# Patient Record
Sex: Female | Born: 1935 | Race: White | Hispanic: No | Smoking: Never smoker
Health system: Southern US, Community
[De-identification: ages and names within clinical notes are randomized; demographics above are authoritative.]

## PROBLEM LIST (undated history)

## (undated) DIAGNOSIS — M4800 Spinal stenosis, site unspecified: Secondary | ICD-10-CM

## (undated) DIAGNOSIS — I499 Cardiac arrhythmia, unspecified: Secondary | ICD-10-CM

## (undated) DIAGNOSIS — E669 Obesity, unspecified: Secondary | ICD-10-CM

## (undated) DIAGNOSIS — D649 Anemia, unspecified: Secondary | ICD-10-CM

## (undated) DIAGNOSIS — I2510 Atherosclerotic heart disease of native coronary artery without angina pectoris: Secondary | ICD-10-CM

## (undated) DIAGNOSIS — I10 Essential (primary) hypertension: Secondary | ICD-10-CM

## (undated) DIAGNOSIS — H8110 Benign paroxysmal vertigo, unspecified ear: Secondary | ICD-10-CM

## (undated) DIAGNOSIS — I350 Nonrheumatic aortic (valve) stenosis: Secondary | ICD-10-CM

## (undated) DIAGNOSIS — M1990 Unspecified osteoarthritis, unspecified site: Secondary | ICD-10-CM

## (undated) DIAGNOSIS — Z9989 Dependence on other enabling machines and devices: Secondary | ICD-10-CM

## (undated) DIAGNOSIS — B269 Mumps without complication: Secondary | ICD-10-CM

## (undated) DIAGNOSIS — Z950 Presence of cardiac pacemaker: Secondary | ICD-10-CM

## (undated) DIAGNOSIS — K635 Polyp of colon: Secondary | ICD-10-CM

## (undated) DIAGNOSIS — I340 Nonrheumatic mitral (valve) insufficiency: Secondary | ICD-10-CM

## (undated) DIAGNOSIS — Z86718 Personal history of other venous thrombosis and embolism: Secondary | ICD-10-CM

## (undated) DIAGNOSIS — G4733 Obstructive sleep apnea (adult) (pediatric): Secondary | ICD-10-CM

## (undated) DIAGNOSIS — E785 Hyperlipidemia, unspecified: Secondary | ICD-10-CM

## (undated) DIAGNOSIS — K859 Acute pancreatitis without necrosis or infection, unspecified: Secondary | ICD-10-CM

## (undated) DIAGNOSIS — Z86010 Personal history of colonic polyps: Secondary | ICD-10-CM

## (undated) DIAGNOSIS — B019 Varicella without complication: Secondary | ICD-10-CM

## (undated) DIAGNOSIS — IMO0002 Reserved for concepts with insufficient information to code with codable children: Secondary | ICD-10-CM

## (undated) DIAGNOSIS — I4819 Other persistent atrial fibrillation: Secondary | ICD-10-CM

## (undated) DIAGNOSIS — D509 Iron deficiency anemia, unspecified: Secondary | ICD-10-CM

## (undated) DIAGNOSIS — G4730 Sleep apnea, unspecified: Secondary | ICD-10-CM

## (undated) DIAGNOSIS — T7840XA Allergy, unspecified, initial encounter: Secondary | ICD-10-CM

## (undated) HISTORY — PX: APPENDECTOMY: SHX54

## (undated) HISTORY — DX: Hyperlipidemia, unspecified: E78.5

## (undated) HISTORY — DX: Atherosclerotic heart disease of native coronary artery without angina pectoris: I25.10

## (undated) HISTORY — PX: JOINT REPLACEMENT: SHX530

## (undated) HISTORY — PX: CYST REMOVAL HAND: SHX6279

## (undated) HISTORY — DX: Obesity, unspecified: E66.9

## (undated) HISTORY — DX: Iron deficiency anemia, unspecified: D50.9

## (undated) HISTORY — DX: Reserved for concepts with insufficient information to code with codable children: IMO0002

## (undated) HISTORY — DX: Allergy, unspecified, initial encounter: T78.40XA

## (undated) HISTORY — DX: Personal history of other venous thrombosis and embolism: Z86.718

## (undated) HISTORY — DX: Spinal stenosis, site unspecified: M48.00

## (undated) HISTORY — PX: LUMBAR LAMINECTOMY: SHX95

## (undated) HISTORY — DX: Anemia, unspecified: D64.9

## (undated) HISTORY — DX: Acute pancreatitis without necrosis or infection, unspecified: K85.90

## (undated) HISTORY — PX: YAG LASER APPLICATION: SHX6189

## (undated) HISTORY — PX: DILATION AND CURETTAGE OF UTERUS: SHX78

## (undated) HISTORY — PX: MENISCUS REPAIR: SHX5179

## (undated) HISTORY — DX: Sleep apnea, unspecified: G47.30

## (undated) HISTORY — DX: Unspecified osteoarthritis, unspecified site: M19.90

## (undated) HISTORY — DX: Essential (primary) hypertension: I10

## (undated) HISTORY — PX: EYE SURGERY: SHX253

## (undated) HISTORY — PX: PACEMAKER INSERTION: SHX728

## (undated) HISTORY — DX: Benign paroxysmal vertigo, unspecified ear: H81.10

## (undated) HISTORY — DX: Obstructive sleep apnea (adult) (pediatric): G47.33

## (undated) HISTORY — DX: Varicella without complication: B01.9

## (undated) HISTORY — PX: TUBAL LIGATION: SHX77

## (undated) HISTORY — DX: Mumps without complication: B26.9

## (undated) HISTORY — PX: SPINE SURGERY: SHX786

## (undated) HISTORY — DX: Personal history of colonic polyps: Z86.010

## (undated) HISTORY — DX: Polyp of colon: K63.5

## (undated) HISTORY — DX: Dependence on other enabling machines and devices: Z99.89

## (undated) HISTORY — PX: TONSILECTOMY, ADENOIDECTOMY, BILATERAL MYRINGOTOMY AND TUBES: SHX2538

## (undated) HISTORY — DX: Nonrheumatic aortic (valve) stenosis: I35.0

---

## 1993-08-31 HISTORY — PX: TOTAL ABDOMINAL HYSTERECTOMY: SHX209

## 2006-08-31 LAB — HM DEXA SCAN

## 2011-05-07 LAB — PROTIME-INR

## 2011-05-22 LAB — PROTIME-INR

## 2011-06-06 LAB — PROTIME-INR

## 2011-06-12 LAB — PROTIME-INR

## 2011-07-01 LAB — PROTIME-INR

## 2011-07-22 LAB — PROTIME-INR

## 2011-08-06 LAB — PROTIME-INR

## 2011-08-28 LAB — PROTIME-INR

## 2011-09-01 HISTORY — PX: JOINT REPLACEMENT: SHX530

## 2011-09-01 HISTORY — PX: REPLACEMENT TOTAL KNEE: SUR1224

## 2011-09-03 LAB — PROTIME-INR

## 2011-09-10 LAB — PROTIME-INR

## 2011-09-24 LAB — PROTIME-INR

## 2011-10-26 LAB — PROTIME-INR

## 2011-11-12 LAB — PROTIME-INR

## 2011-11-30 LAB — PROTIME-INR

## 2013-03-31 LAB — HM PAP SMEAR: HM Pap smear: NORMAL

## 2013-03-31 LAB — HM MAMMOGRAPHY: HM MAMMO: NORMAL

## 2013-10-04 ENCOUNTER — Encounter: Payer: Self-pay | Admitting: Cardiology

## 2013-10-04 ENCOUNTER — Ambulatory Visit (INDEPENDENT_AMBULATORY_CARE_PROVIDER_SITE_OTHER): Payer: Medicare Other | Admitting: Cardiology

## 2013-10-04 VITALS — BP 152/80 | HR 85 | Ht 63.5 in | Wt 257.8 lb

## 2013-10-04 DIAGNOSIS — I10 Essential (primary) hypertension: Secondary | ICD-10-CM

## 2013-10-04 DIAGNOSIS — I4891 Unspecified atrial fibrillation: Secondary | ICD-10-CM | POA: Insufficient documentation

## 2013-10-04 NOTE — Progress Notes (Signed)
Patient ID: [name], female   DOB: 12-30-1933, 80 y.o.   MRN: [id number]     Patient Name: Tracy Miles Date of Encounter: 10/04/2013  Primary Care Provider:  No primary provider on file. Primary Cardiologist:  [name], H  Problem List   Past Medical History  Diagnosis Date  . Hypertension   . Heart murmur   . Coronary artery disease   . Atrial fibrillation    Past Surgical History  Procedure Laterality Date  . Tonsilectomy, adenoidectomy, bilateral myringotomy and tubes    . Lamenectomy    . Tubal ligation    . Total abdominal hysterectomy    . Replacement total knee    . Cyst removal hand    . Meniscos repair      Allergies  Allergies  Allergen Reactions  . Other     Sulfa drugs  . Penicillins Rash    HPI  A very pleasant 80 year old female who recently moved here from [place] after her husband passed away and she wanted to be closer to her family. The patient has a lifelong history of paroxysmal atrial fibrillation. The first time because diagnosed with was at age 21. She has been having on and of episodes that are very symptomatic for her associated with rapid ventricular response and with symptoms of shortness of breath. She has a history of electrical cardioversion in 1995. She went to rapid A. fib in August all 2014, after cardioversion on 05/10/2013 the patient went back to A. fib the following day. Another cardioversion was performed on 06/22/2013 and the patient was loaded with amiodarone and maintained on 400 mg of amiodarone orally daily. Because of recurrent episodes of atrial fibrillation patient was referred to an electrophysiologist in [place]. However patient moved to [place] earlier than was expected and never made it to her appointment. She is coming here today to reestablish cardiology care. She states that she is since she has been started on amiodarone she feels profoundly fatigued. In the past she used to play golf on a  regular basis however right now she is not able to as she feels profound shortness of breath and fatigue. She denies any chest pain, orthopnea or paroxysmal nocturnal dyspnea. She states that her last echocardiogram was performed in 2013 and she believes that her ejection fraction was preserved.  Home Medications  Prior to Admission medications   Medication Sig Start Date End Date Taking? Authorizing Provider  lisinopril (PRINIVIL,ZESTRIL) 10 MG tablet  09/14/13   Historical Provider, MD  Alveda Reasons 20 MG TABS tablet  09/19/13   Historical Provider, MD    Family History  Family History  Problem Relation Age of Onset  . Heart attack Mother   . Cancer - Prostate Father   . COPD Father   . Heart disease Brother   . Diabetes Maternal Grandmother     Social History  History   Social History  . Marital Status: Widowed    Spouse Name: N/A    Number of Children: N/A  . Years of Education: N/A   Occupational History  . Not on file.   Social History Main Topics  . Smoking status: Never Smoker   . Smokeless tobacco: Not on file  . Alcohol Use: Not on file  . Drug Use: Not on file  . Sexual Activity: Not on file   Other Topics Concern  . Not on file   Social History Narrative  . No narrative on file     Review of  Systems, as per HPI, otherwise negative General:  No chills, fever, night sweats or weight changes.  Cardiovascular:  No chest pain, dyspnea on exertion, edema, orthopnea, palpitations, paroxysmal nocturnal dyspnea. Dermatological: No rash, lesions/masses Respiratory: No cough, dyspnea Urologic: No hematuria, dysuria Abdominal:   No nausea, vomiting, diarrhea, bright red blood per rectum, melena, or hematemesis Neurologic:  No visual changes, wkns, changes in mental status. All other systems reviewed and are otherwise negative except as noted above.  Physical Exam  Blood pressure 152/80, pulse 85, height 5' 3.5" (1.613 m), weight 257 lb 12.8 oz (116.937 kg).    General: Pleasant, NAD Psych: Normal affect. Neuro: Alert and oriented X 3. Moves all extremities spontaneously. HEENT: Normal  Neck: Supple without bruits or JVD. Lungs:  Resp regular and unlabored, CTA. Heart: RRR no s3, s4, 2/6 systolic murmur Abdomen: Soft, non-tender, non-distended, BS + x 4.  Extremities: No clubbing, cyanosis or edema. DP/PT/Radials 2+ and equal bilaterally.  Labs:   Accessory Clinical Findings  Echocardiogram - none  ECG -sinus rhythm with first degree AV block, low voltage EKG, nonspecific ST abnormality. Abnormal EKG   Assessment & Plan  80 year old female with lifelong history of paroxysmal atrial fibrillation on anticoagulation with Xarelto, status post cardioversion in October 2014 on amiodarone 400 mg daily, currently in sinus rhythm. The patient symptomatic with shortness of breath and fatigue. We will order a transthoracic echocardiogram since we don't have any records about her LV function, filling pressures and left atrial size. There is systolic murmur on exam, most probably mitral regurgitation. We'll also order labs including TSH to evaluate for possible side effects of amiodarone. We will refer patient shouldn't to Dr. all read for consideration of possible ablation. We will request all the records from the hospital in [place]. Her blood pressure is elevated today, she states is usually controlled, we will recheck at the next visit and readjust her blood pressure medication.  [name], MD, [facility] 10/04/2013, 3:47 PM

## 2013-10-04 NOTE — Patient Instructions (Signed)
**Note De-Identified Tracy Miles Obfuscation** Your physician has requested that you have an echocardiogram. Echocardiography is a painless test that uses sound waves to create images of your heart. It provides your doctor with information about the size and shape of your heart and how well your heart's chambers and valves are working. This procedure takes approximately one hour. There are no restrictions for this procedure.  Your physician recommends that you return for lab work in: today  Your physician recommends that you schedule an office visit with Dr Rayann Heman for possible ablation

## 2013-10-05 ENCOUNTER — Telehealth: Payer: Self-pay | Admitting: *Deleted

## 2013-10-05 ENCOUNTER — Telehealth: Payer: Self-pay | Admitting: Internal Medicine

## 2013-10-05 DIAGNOSIS — I10 Essential (primary) hypertension: Secondary | ICD-10-CM

## 2013-10-05 LAB — COMPREHENSIVE METABOLIC PANEL
ALT: 16 U/L (ref 0–35)
AST: 19 U/L (ref 0–37)
Albumin: 4 g/dL (ref 3.5–5.2)
Alkaline Phosphatase: 88 U/L (ref 39–117)
BUN: 23 mg/dL (ref 6–23)
CO2: 29 mEq/L (ref 19–32)
Calcium: 9.5 mg/dL (ref 8.4–10.5)
Chloride: 107 mEq/L (ref 96–112)
Creatinine, Ser: 1.1 mg/dL (ref 0.4–1.2)
GFR: 49.56 mL/min — ABNORMAL LOW (ref >60.00)
Glucose, Bld: 99 mg/dL (ref 70–99)
Potassium: 4.2 mEq/L (ref 3.5–5.1)
Sodium: 145 mEq/L (ref 135–145)
Total Bilirubin: 0.6 mg/dL (ref 0.3–1.2)
Total Protein: 7.1 g/dL (ref 6.0–8.3)

## 2013-10-05 LAB — CBC WITH DIFFERENTIAL/PLATELET
Basophils Absolute: 0 10*3/uL (ref 0.0–0.1)
Basophils Relative: 0.4 % (ref 0.0–3.0)
Eosinophils Absolute: 0.3 10*3/uL (ref 0.0–0.7)
Eosinophils Relative: 3.5 % (ref 0.0–5.0)
HCT: 27.1 % — ABNORMAL LOW (ref 36.0–46.0)
Hemoglobin: 8.6 g/dL — ABNORMAL LOW (ref 12.0–15.0)
Lymphocytes Relative: 14.7 % (ref 12.0–46.0)
Lymphs Abs: 1.2 10*3/uL (ref 0.7–4.0)
MCHC: 31.6 g/dL (ref 30.0–36.0)
MCV: 82 fl (ref 78.0–100.0)
Monocytes Absolute: 0.4 10*3/uL (ref 0.1–1.0)
Monocytes Relative: 5.2 % (ref 3.0–12.0)
Neutro Abs: 6.2 10*3/uL (ref 1.4–7.7)
Neutrophils Relative %: 76.2 % (ref 43.0–77.0)
Platelets: 334 10*3/uL (ref 150.0–400.0)
RBC: 3.31 Mil/uL — ABNORMAL LOW (ref 3.87–5.11)
RDW: 15.9 % — ABNORMAL HIGH (ref 11.5–14.6)
WBC: 8.2 10*3/uL (ref 4.5–10.5)

## 2013-10-05 LAB — TSH: TSH: 6.31 u[IU]/mL — ABNORMAL HIGH (ref 0.35–5.50)

## 2013-10-05 NOTE — Telephone Encounter (Signed)
Labs results from 10/04/13 reviewed by Dr. Percival Spanish. Results indicate Hgb 8.6/Hct 27.1. Dr. Percival Spanish advises for patient to have lab work repeated on Monday, 10/08/13, and for her to be sent or pick up stool guiac cards so that she can get stool guaic sample/readings.  Will also request her to get her previous medical records from [place] forwarded to our office. Spoke with patient. She is agreeable to Dr. Rosezella Florida advisements. She will return Monday for repeat labwork. She will perform the stool guiac cards and she will work to get her records transferred to our office. Note copied to Dr. Meda Coffee.

## 2013-10-05 NOTE — Telephone Encounter (Signed)
Spoke with patient after she requested further information regarding where and when to take the stool/hemoccult sample. Informed her to return hemoccult card to [address], [place], [place] - Lab.  Patient verbalized understanding. She stated that instead of driving here to pick up the hemoccult card, she obtained one from her [facility] nurse. Patient agreed to come in on Monday, 10/08/13 for repeat CBC. She is also aware to call back for any concerns or problems, including obvious bleeding or worsening symptoms.

## 2013-10-05 NOTE — Progress Notes (Signed)
Quick Note:  Labs results from 10/04/13 reviewed by Dr. Percival Spanish. Results indicate Hgb 8.6/Hct 27.1. Dr. Percival Spanish advises for patient to have lab work repeated on Monday, 10/08/13, and for her to be sent or pick up stool guiac cards so that she can get stool guaic sample/readings. Will also request her to get her previous medical records from [place] forwarded to our office. Spoke with patient. She is agreeable to Dr. Rosezella Florida advisements. She will return Monday for repeat labwork. She will perform the stool guiac cards and she will work to get her records transferred to our office. Note copied to Dr. Meda Coffee. ______

## 2013-10-05 NOTE — Telephone Encounter (Signed)
ROI faxed to [facility] [place] at [phone]

## 2013-10-05 NOTE — Telephone Encounter (Signed)
New problem    Pt stated she received a call stating she need to r/s her echocardiogram but don't know why. Pt would like to speak to nurse concerning this matter. Dr Meda Coffee is primary doctor.

## 2013-10-05 NOTE — Telephone Encounter (Signed)
I spoke with [facility] before calling pt about ECHO scheduled for 10/12/13. She had not called pt & appointment was still a go for the 12th.  I called pt & she said she had received a recorded message yesterday telling her she needed to reschedule ECHO.  Pt is aware 10/12/13 at 1pm is still ok for her ECHO as scheduled Horton Chin RN

## 2013-10-06 ENCOUNTER — Encounter: Payer: Self-pay | Admitting: Internal Medicine

## 2013-10-06 ENCOUNTER — Telehealth: Payer: Self-pay | Admitting: *Deleted

## 2013-10-06 DIAGNOSIS — D649 Anemia, unspecified: Secondary | ICD-10-CM

## 2013-10-06 NOTE — Telephone Encounter (Signed)
Received call from [facility] re: results of hemoccult tests that was ordered.  States the hemocult was positive.  Spoke w/pt who states she is not having any active bleeding; does not see any blood when she wipes. Denies any abdominal pain. States she is constipated. Has not been taking anything for constipation. Has an appointment on Mon for CBS in our office.  Spoke w/Dr. Meda Coffee who suggests that she be referred to GI physician.  Also suggested she take MOM and a stool softner (colace).  Notified pt and and advised that someone would call her with an appointment.  Will notify her of CBC results after Dr. Meda Coffee reviews. Will send request to [facility] to make referral.

## 2013-10-09 ENCOUNTER — Other Ambulatory Visit (INDEPENDENT_AMBULATORY_CARE_PROVIDER_SITE_OTHER): Payer: Medicare Other

## 2013-10-09 DIAGNOSIS — I10 Essential (primary) hypertension: Secondary | ICD-10-CM

## 2013-10-09 LAB — CBC WITH DIFFERENTIAL/PLATELET
Basophils Absolute: 0 10*3/uL (ref 0.0–0.1)
Basophils Relative: 0.5 % (ref 0.0–3.0)
Eosinophils Absolute: 0.3 10*3/uL (ref 0.0–0.7)
Eosinophils Relative: 3.8 % (ref 0.0–5.0)
HCT: 26.3 % — ABNORMAL LOW (ref 36.0–46.0)
Hemoglobin: 8.1 g/dL — ABNORMAL LOW (ref 12.0–15.0)
Lymphocytes Relative: 15.9 % (ref 12.0–46.0)
Lymphs Abs: 1.1 10*3/uL (ref 0.7–4.0)
MCHC: 31 g/dL (ref 30.0–36.0)
MCV: 81.7 fl (ref 78.0–100.0)
MONO ABS: 0.5 10*3/uL (ref 0.1–1.0)
MONOS PCT: 7.4 % (ref 3.0–12.0)
Neutro Abs: 5.2 10*3/uL (ref 1.4–7.7)
Neutrophils Relative %: 72.4 % (ref 43.0–77.0)
PLATELETS: 341 10*3/uL (ref 150.0–400.0)
RBC: 3.22 Mil/uL — ABNORMAL LOW (ref 3.87–5.11)
RDW: 15.8 % — ABNORMAL HIGH (ref 11.5–14.6)
WBC: 7.2 10*3/uL (ref 4.5–10.5)

## 2013-10-10 NOTE — Progress Notes (Signed)
Patient ID: Tracy Miles, female   DOB: 03-02-1934, 80 y.o.   MRN: [id number]  The patient has severe anemia and positive FOBT (hemocult). The risk of bleeding currently out weights the risk of stroke, we will hold Xarelto. The patient is scheduled for a GI evaluation, we will try to arrange for an earlier appointment. We will start aspirin 325 mg po daily, decrease amiodarone to 200 mg po daily as TSH is elevated and check free T4.  [name], H 10/10/2013

## 2013-10-11 ENCOUNTER — Other Ambulatory Visit: Payer: Self-pay

## 2013-10-11 DIAGNOSIS — I4891 Unspecified atrial fibrillation: Secondary | ICD-10-CM

## 2013-10-11 DIAGNOSIS — I10 Essential (primary) hypertension: Secondary | ICD-10-CM

## 2013-10-12 ENCOUNTER — Other Ambulatory Visit (INDEPENDENT_AMBULATORY_CARE_PROVIDER_SITE_OTHER): Payer: Medicare Other

## 2013-10-12 ENCOUNTER — Ambulatory Visit (HOSPITAL_COMMUNITY): Payer: Medicare Other | Attending: Cardiology | Admitting: Cardiology

## 2013-10-12 ENCOUNTER — Telehealth: Payer: Self-pay | Admitting: Internal Medicine

## 2013-10-12 DIAGNOSIS — I4891 Unspecified atrial fibrillation: Secondary | ICD-10-CM

## 2013-10-12 DIAGNOSIS — I059 Rheumatic mitral valve disease, unspecified: Secondary | ICD-10-CM | POA: Insufficient documentation

## 2013-10-12 DIAGNOSIS — I10 Essential (primary) hypertension: Secondary | ICD-10-CM

## 2013-10-12 LAB — T4, FREE: Free T4: 0.99 ng/dL (ref 0.60–1.60)

## 2013-10-12 NOTE — Telephone Encounter (Signed)
Records rec From [facility] gave to Operators

## 2013-10-12 NOTE — Progress Notes (Signed)
   Patient ID: Tracy Miles, female    DOB: 05/16/1934, 80 y.o.   MRN: [id number]  HPI    Review of Systems    Physical Exam

## 2013-10-12 NOTE — Progress Notes (Signed)
Echo performed. 

## 2013-10-17 ENCOUNTER — Ambulatory Visit: Payer: Medicare Other | Admitting: Physician Assistant

## 2013-10-19 ENCOUNTER — Other Ambulatory Visit (INDEPENDENT_AMBULATORY_CARE_PROVIDER_SITE_OTHER): Payer: Medicare Other

## 2013-10-19 ENCOUNTER — Encounter: Payer: Self-pay | Admitting: Physician Assistant

## 2013-10-19 ENCOUNTER — Ambulatory Visit (INDEPENDENT_AMBULATORY_CARE_PROVIDER_SITE_OTHER): Payer: Medicare Other | Admitting: Physician Assistant

## 2013-10-19 VITALS — BP 130/62 | HR 72 | Ht 63.5 in | Wt 257.4 lb

## 2013-10-19 DIAGNOSIS — R195 Other fecal abnormalities: Secondary | ICD-10-CM

## 2013-10-19 DIAGNOSIS — D649 Anemia, unspecified: Secondary | ICD-10-CM

## 2013-10-19 DIAGNOSIS — E782 Mixed hyperlipidemia: Secondary | ICD-10-CM | POA: Insufficient documentation

## 2013-10-19 DIAGNOSIS — E785 Hyperlipidemia, unspecified: Secondary | ICD-10-CM

## 2013-10-19 LAB — CBC WITH DIFFERENTIAL/PLATELET
Basophils Absolute: 0 10*3/uL (ref 0.0–0.1)
Basophils Relative: 0.2 % (ref 0.0–3.0)
EOS PCT: 3.7 % (ref 0.0–5.0)
Eosinophils Absolute: 0.3 10*3/uL (ref 0.0–0.7)
HEMATOCRIT: 28 % — AB (ref 36.0–46.0)
LYMPHS ABS: 1.3 10*3/uL (ref 0.7–4.0)
Lymphocytes Relative: 17.9 % (ref 12.0–46.0)
MCHC: 31.5 g/dL (ref 30.0–36.0)
MCV: 80.1 fl (ref 78.0–100.0)
MONOS PCT: 7.1 % (ref 3.0–12.0)
Monocytes Absolute: 0.5 10*3/uL (ref 0.1–1.0)
NEUTROS ABS: 5.1 10*3/uL (ref 1.4–7.7)
Neutrophils Relative %: 71.1 % (ref 43.0–77.0)
Platelets: 378 10*3/uL (ref 150.0–400.0)
RBC: 3.5 Mil/uL — ABNORMAL LOW (ref 3.87–5.11)
RDW: 15.7 % — ABNORMAL HIGH (ref 11.5–14.6)
WBC: 7.2 10*3/uL (ref 4.5–10.5)

## 2013-10-19 LAB — IBC PANEL
Iron: 15 ug/dL — ABNORMAL LOW (ref 42–145)
SATURATION RATIOS: 2.8 % — AB (ref 20.0–50.0)
Transferrin: 384.3 mg/dL — ABNORMAL HIGH (ref 212.0–360.0)

## 2013-10-19 LAB — FERRITIN: Ferritin: 15.1 ng/mL (ref 10.0–291.0)

## 2013-10-19 MED ORDER — MOVIPREP 100 G PO SOLR: 1.0000 | ORAL | Status: DC

## 2013-10-19 NOTE — Patient Instructions (Signed)
Please go to the basement level to have your labs drawn.  You have been scheduled for an endoscopy and colonoscopy with propofol. Please follow the written instructions given to you at your visit today. Please pick up your prep at the pharmacy within the next 1-3 days. If you use inhalers (even only as needed), please bring them with you on the day of your procedure. Marland Kitchen

## 2013-10-19 NOTE — Progress Notes (Addendum)
Subjective:    Patient ID: Tracy Miles, female    DOB: [birth year], 80 y.o.   MRN: [id number]  HPI  Tracy Miles is a pleasant 80 year old white female new to GI today referred by cardiology. Recently established  with Dr. [name]. Patient states she just  relocated to [place] from [place] within the past few months. She has history of atrial fibrillation remote history of DVT x2, hypertension and has been chronically anticoagulated for many years. Most recently she has been on Xarelto  over the past couple of years. Labs were checked on February 4 and she was found to be anemic with hemoglobin of 8.6 hematocrit of 27.1 and MCV of 82 platelets 334 TSH slightly elevated at 6.31 chemistries unremarkable. Repeat labs on 10/09/2013 11 8.1 hematocrit of 26.3. Patient tells me that she did a Hemoccult at [place] where she resides and was told this was positive. Patient has no prior history of anemia that she is aware of. She says she has been fatigued over the past 4 or 5 months but thought that was due to the stress of moving etc. She has noticed some exertional dyspnea but no chest pain. She has no GI complaints other than constipation which started when she was placed on amiodarone. She is just had her dose of amiodarone decreased but hasn't noticed any change in his bowel habits. She is having a bowel movement every day, to every other day but says her stools are hard her. She has no complaints of abdominal pain, melena or hematochezia. No dysphagia ,odynophagia, heartburn or indigestion. Xarelto was discontinued last week with a finding of the anemia and she is now on a regular aspirin daily. Patient does relate history of colon polyps and had been followed by a physician in [place]. She had been having colonoscopies about every 3 years but was told at the last colon that she could go 10 years. Her last colonoscopy was in 2005. She is no family history of colon cancer polyps that she  is aware of.        Review of Systems  Constitutional: Positive for fatigue.  HENT: Negative.   Eyes: Negative.   Respiratory: Positive for shortness of breath.   Cardiovascular: Negative.   Gastrointestinal: Positive for constipation.  Endocrine: Negative.   Genitourinary: Negative.   Musculoskeletal: Negative.   Skin: Negative.   Allergic/Immunologic: Negative.   Neurological: Negative.   Hematological: Negative.   Psychiatric/Behavioral: Negative.    Outpatient Prescriptions Prior to Visit  Medication Sig Dispense Refill  . amiodarone (PACERONE) 200 MG tablet Take 200 mg by mouth daily.      Marland Kitchen atorvastatin (LIPITOR) 20 MG tablet Take 20 mg by mouth daily.      . calcium carbonate (OS-CAL) 600 MG TABS tablet Take 600 mg by mouth 2 (two) times daily with a meal.      . lisinopril (PRINIVIL,ZESTRIL) 10 MG tablet       . loratadine (CLARITIN) 10 MG tablet Take 10 mg by mouth daily.      . potassium chloride (K-DUR) 10 MEQ tablet Take 10 mEq by mouth daily.      . verapamil (CALAN) 80 MG tablet Take 80 mg by mouth 3 (three) times daily.      Alveda Reasons 20 MG TABS tablet        No facility-administered medications prior to visit.   Allergies  Allergen Reactions  . Sulfa Antibiotics   . Penicillins Rash   Patient Active Problem  List   Diagnosis Date Noted  . Other and unspecified hyperlipidemia 10/19/2013  . Atrial fibrillation 10/04/2013  . Essential hypertension 10/04/2013   History  Substance Use Topics  . Smoking status: Never Smoker   . Smokeless tobacco: Never Used  . Alcohol Use: No   family history includes COPD in her father; Diabetes in her maternal grandmother; Heart attack in her mother; Heart disease in her brother; Prostate cancer in her father. There is no history of Colon cancer, Pancreatic cancer, Stomach cancer, Throat cancer, Kidney disease, or Liver disease.     Objective:   Physical Exam  older white female in no acute distress, pleasant blood  pressure 130/62 pulse 72 height 5 foot 3 weight 257 BMI 44.8, HEENT; nontraumatic normocephalic EOMI PERRLA sclera anicteric, Supple; no JVD, Cardiovascula;r regular rate and rhythm with S1-S2 no murmur or gallop, Pulmonary; clear bilaterally, Abdomen; large soft basically nontender there is no palpable mass or hepatosplenomegaly bowel sounds are active, Rectal; exam the external lesion noted scant stool in the rectal vault brown and trace heme positive, Ext; no clubbing cyanosis or edema skin warm and dry, Psych ;mood and affect normal and appropriate        Assessment & Plan:  #78  80 year old female with finding of new normocytic anemia in the setting of chronic anticoagulation with Xarelto . Xarelto  has been discontinued and she is now on aspirin 325 daily- stool trace heme positive. Patient symptomatic with fatigue and exertional dyspnea. Will need to rule out occult colon lesion, gastric lesion/AVMs etc. #2 personal history of colon polyps-type not known records pending #3 atrial fibrillation #4 hyperlipidemia #5 hypertension  Plan; Will obtain records from her gastroenterologist in [facility] Schedule for colonoscopy and upper endoscopy with Dr. Larita Fife is discussed in detail with patient and she is agreeable to proceed She will remain on aspirin 325 daily Repeat CBC with differential today and also check iron studies We discussed possibility of blood transfusion should her hemoglobin continue to drift., and she is agreeable Further workup pending findings of above.  Addendum: Reviewed and agree with initial management. Jerene Bears, MD

## 2013-10-25 ENCOUNTER — Encounter: Payer: Self-pay | Admitting: Internal Medicine

## 2013-10-26 ENCOUNTER — Encounter: Payer: Medicare Other | Admitting: Internal Medicine

## 2013-11-01 ENCOUNTER — Ambulatory Visit: Payer: Medicare Other | Admitting: Internal Medicine

## 2013-11-06 ENCOUNTER — Ambulatory Visit (INDEPENDENT_AMBULATORY_CARE_PROVIDER_SITE_OTHER): Payer: Medicare Other | Admitting: Internal Medicine

## 2013-11-06 ENCOUNTER — Encounter: Payer: Self-pay | Admitting: Internal Medicine

## 2013-11-06 VITALS — BP 186/84 | HR 76 | Ht 63.5 in | Wt 258.0 lb

## 2013-11-06 DIAGNOSIS — R5383 Other fatigue: Secondary | ICD-10-CM

## 2013-11-06 DIAGNOSIS — R0602 Shortness of breath: Secondary | ICD-10-CM

## 2013-11-06 DIAGNOSIS — I10 Essential (primary) hypertension: Secondary | ICD-10-CM

## 2013-11-06 DIAGNOSIS — I4891 Unspecified atrial fibrillation: Secondary | ICD-10-CM

## 2013-11-06 DIAGNOSIS — R5381 Other malaise: Secondary | ICD-10-CM

## 2013-11-06 DIAGNOSIS — D649 Anemia, unspecified: Secondary | ICD-10-CM

## 2013-11-06 LAB — BASIC METABOLIC PANEL
BUN: 21 mg/dL (ref 6–23)
CO2: 30 mEq/L (ref 19–32)
Calcium: 9.7 mg/dL (ref 8.4–10.5)
Chloride: 103 mEq/L (ref 96–112)
Creatinine, Ser: 1.1 mg/dL (ref 0.4–1.2)
GFR: 52.77 mL/min — AB (ref >60.00)
GLUCOSE: 101 mg/dL — AB (ref 70–99)
POTASSIUM: 4.2 meq/L (ref 3.5–5.1)
Sodium: 141 mEq/L (ref 135–145)

## 2013-11-06 LAB — CBC WITH DIFFERENTIAL/PLATELET
Basophils Absolute: 0 10*3/uL (ref 0.0–0.1)
Basophils Relative: 0.3 % (ref 0.0–3.0)
EOS PCT: 4.2 % (ref 0.0–5.0)
Eosinophils Absolute: 0.3 10*3/uL (ref 0.0–0.7)
HEMATOCRIT: 26.8 % — AB (ref 36.0–46.0)
Hemoglobin: 8.3 g/dL — ABNORMAL LOW (ref 12.0–15.0)
LYMPHS ABS: 1.2 10*3/uL (ref 0.7–4.0)
Lymphocytes Relative: 16 % (ref 12.0–46.0)
MCHC: 31 g/dL (ref 30.0–36.0)
MONO ABS: 0.4 10*3/uL (ref 0.1–1.0)
Monocytes Relative: 6.1 % (ref 3.0–12.0)
NEUTROS PCT: 73.4 % (ref 43.0–77.0)
Neutro Abs: 5.3 10*3/uL (ref 1.4–7.7)
Platelets: 336 10*3/uL (ref 150.0–400.0)
RBC: 3.47 Mil/uL — AB (ref 3.87–5.11)
RDW: 16.6 % — ABNORMAL HIGH (ref 11.5–14.6)
WBC: 7.2 10*3/uL (ref 4.5–10.5)

## 2013-11-06 MED ORDER — APIXABAN 5 MG PO TABS: 5.0000 mg | ORAL_TABLET | Freq: Two times a day (BID) | ORAL | Status: DC

## 2013-11-06 MED ORDER — AMIODARONE HCL 200 MG PO TABS: 200.0000 mg | ORAL_TABLET | Freq: Every day | ORAL | Status: DC

## 2013-11-06 NOTE — Patient Instructions (Signed)
Your physician recommends that you schedule a follow-up appointment in: 6 weeks with Dr Rayann Heman  Your physician has recommended that you have a pulmonary function test. Pulmonary Function Tests are a group of tests that measure how well air moves in and out of your lungs.  Your physician has requested that you have en exercise stress myoview. For further information please visit [email or web address]. Please follow instruction sheet, as given.   Your physician has recommended you make the following change in your medication:  1) Stop Aspirin 2) Start Eliquis 5mg  twice daily  Your physician recommends that you return for lab work today: BMP/CBC

## 2013-11-08 NOTE — Progress Notes (Addendum)
Primary Care Physician: [name], MD Referring Physician:  Dr Higinio Plan Tracy Miles is a 80 y.o. female with a h/o atrial fibrillation who presents for EP consultation.  She has a long history of tachypalpitations.  She has been diagnosed with atrial fibrillation.  She reports abrupt onset/offset of tachypalpitations since age 35.  These have waxed and waned in frequency over time.  She was cardioverted in 1995.  Recently she reports increasing frequency and duration of episodes of atrial fibrillation.  She was cardioverted 05/10/13 and again 06/22/13.  Sh ehas been placed on amiodarone.   Because of recurrent episodes of atrial fibrillation she was referred to an electrophysiologist in [place] when living there.  She has since moved to [place]. In the past she used to play golf on a regular basis however recently she has been unable to do so because of shortness of breath and fatigue.  She was evaluated by Dr Meda Coffee and is referred to EP for further evaluation.  Today, she denies symptoms of palpitations, chest pain,  orthopnea, PND, lower extremity edema, dizziness, presyncope, syncope, or neurologic sequela. The patient is tolerating medications without difficulties and is otherwise without complaint today.   Past Medical History  Diagnosis Date   Hypertension    Heart murmur    Coronary artery disease    Atrial fibrillation    Anemia    Arthritis    Colon polyp    Personal history of DVT (deep vein thrombosis)     x 2   Hyperlipidemia    Obesity    Pancreatitis     post hysterectomy   Sleep apnea    Aortic stenosis     Very mild   Hypercholesteremia    OSA on CPAP    PAF (paroxysmal atrial fibrillation)    History of deep vein thrombophlebitis of lower extremity    History of valvular heart disease     LEFT   Past Surgical History  Procedure Laterality Date   Tonsilectomy, adenoidectomy, bilateral myringotomy and tubes     Laminectomy      Tubal ligation     Total abdominal hysterectomy     Replacement total knee Left    Cyst removal hand Bilateral    Meniscus repair Bilateral     Current Outpatient Prescriptions  Medication Sig Dispense Refill   amiodarone (PACERONE) 200 MG tablet Take 1 tablet (200 mg total) by mouth daily.       atorvastatin (LIPITOR) 20 MG tablet Take 20 mg by mouth daily.       bumetanide (BUMEX) 1 MG tablet Take 1 tablet by mouth daily.       calcium carbonate (OS-CAL) 600 MG TABS tablet Take 600 mg by mouth 2 (two) times daily with a meal.       KLOR-CON M10 10 MEQ tablet Take 1 tablet by mouth daily.       lisinopril (PRINIVIL,ZESTRIL) 10 MG tablet Take 10 mg by mouth daily.        loratadine (CLARITIN) 10 MG tablet Take 10 mg by mouth daily as needed.        verapamil (COVERA HS) 180 MG (CO) 24 hr tablet Take 180 mg by mouth at bedtime.       apixaban (ELIQUIS) 5 MG TABS tablet Take 1 tablet (5 mg total) by mouth 2 (two) times daily.  60 tablet  0   No current facility-administered medications for this visit.    Allergies  Allergen Reactions  Sulfa Antibiotics    Zebeta [Bisoprolol Fumarate]    Penicillins Rash    History   Social History   Marital Status: Widowed    Spouse Name: N/A    Number of Children: N/A   Years of Education: N/A   Occupational History   Not on file.   Social History Main Topics   Smoking status: Never Smoker    Smokeless tobacco: Never Used   Alcohol Use: No   Drug Use: No   Sexual Activity: Not on file   Other Topics Concern   Not on file   Social History Narrative   No narrative on file    Family History  Problem Relation Age of Onset   Heart attack Mother    Prostate cancer Father    COPD Father    Heart disease Brother    Diabetes Maternal Grandmother    Colon cancer Neg Hx    Pancreatic cancer Neg Hx    Stomach cancer Neg Hx    Throat cancer Neg Hx    Kidney disease Neg Hx    Liver disease Neg  Hx    CAD     Hypertension      ROS- All systems are reviewed and negative except as per the HPI above  Physical Exam: Filed Vitals:   11/06/13 1122  BP: 186/84  Pulse: 76  Height: 5' 3.5" (1.613 m)  Weight: 258 lb (117.028 kg)    GEN- The patient is well appearing, alert and oriented x 3 today.   Head- normocephalic, atraumatic Eyes-  Sclera clear, conjunctiva pink Ears- hearing intact Oropharynx- clear Neck- supple, no JVP Lymph- no cervical lymphadenopathy Lungs- Clear to ausculation bilaterally, normal work of breathing Heart- Regular rate and rhythm, no murmurs, rubs or gallops, PMI not laterally displaced GI- soft, NT, ND, + BS Extremities- no clubbing, cyanosis, or edema MS- no significant deformity or atrophy Skin- no rash or lesion Psych- euthymic mood, full affect Neuro- strength and sensation are intact  EKG today reveals sinus rhythm 76 bpm, PR 210, otherwise normal ekg Dr Thera Flake notes are reviewed today 26 pages of [facility] from [facility] cardiology are also reviewed  Assessment and Plan:   1. SOB/ fatigue Unclear etiology Given significant decline in exercise tolerance, I think that myoview is warranted.  I will therefore arrange for myoview at the next available time. Will obtain PFTs as she is on amiodarone Her symptoms could also be due to anemia.  She will need close follow-up with primary care for this.  2. afib Her chads2vasc score is at least 5.  She also has a h/o DVT.  I would therefore advise anticoagulation.  Given AVEROES data, I would recommend eliquis over asa which she is currently taking. I will start eliquis and she will need close follow-up of anemia with primary care.  We will obtain a bmet and cbc today.  Given her anemia, she would be a poor candidate for ablation presently.  3. htn Stable No change required today  4. Anemia As above  Return in 6 weeks

## 2013-11-13 DIAGNOSIS — D509 Iron deficiency anemia, unspecified: Secondary | ICD-10-CM | POA: Insufficient documentation

## 2013-11-13 DIAGNOSIS — R5383 Other fatigue: Secondary | ICD-10-CM | POA: Insufficient documentation

## 2013-11-13 DIAGNOSIS — R0602 Shortness of breath: Secondary | ICD-10-CM | POA: Insufficient documentation

## 2013-11-13 HISTORY — DX: Iron deficiency anemia, unspecified: D50.9

## 2013-11-15 ENCOUNTER — Ambulatory Visit (INDEPENDENT_AMBULATORY_CARE_PROVIDER_SITE_OTHER): Payer: Medicare Other | Admitting: Internal Medicine

## 2013-11-15 DIAGNOSIS — R0602 Shortness of breath: Secondary | ICD-10-CM

## 2013-11-15 DIAGNOSIS — I4891 Unspecified atrial fibrillation: Secondary | ICD-10-CM

## 2013-11-15 LAB — PULMONARY FUNCTION TEST
DL/VA % pred: 78 %
DL/VA: 3.67 ml/min/mmHg/L
DLCO unc % pred: 62 %
DLCO unc: 14.34 ml/min/mmHg
FEF 25-75 POST: 2.34 L/s
FEF 25-75 Pre: 1.62 L/sec
FEF2575-%CHANGE-POST: 44 %
FEF2575-%PRED-POST: 159 %
FEF2575-%Pred-Pre: 110 %
FEV1-%Change-Post: 9 %
FEV1-%PRED-PRE: 92 %
FEV1-%Pred-Post: 100 %
FEV1-POST: 1.95 L
FEV1-Pre: 1.78 L
FEV1FVC-%Change-Post: 4 %
FEV1FVC-%Pred-Pre: 107 %
FEV6-%CHANGE-POST: 3 %
FEV6-%Pred-Post: 94 %
FEV6-%Pred-Pre: 91 %
FEV6-Post: 2.32 L
FEV6-Pre: 2.24 L
FEV6FVC-%Pred-Post: 105 %
FEV6FVC-%Pred-Pre: 105 %
FVC-%Change-Post: 4 %
FVC-%PRED-POST: 90 %
FVC-%Pred-Pre: 86 %
FVC-Post: 2.34 L
FVC-Pre: 2.24 L
POST FEV1/FVC RATIO: 83 %
PRE FEV1/FVC RATIO: 79 %
Post FEV6/FVC ratio: 100 %
Pre FEV6/FVC Ratio: 100 %
RV % PRED: 87 %
RV: 2.01 L
TLC % pred: 89 %
TLC: 4.38 L

## 2013-11-15 NOTE — Progress Notes (Signed)
PFT done today. Jennifer Castillo, CMA  

## 2013-11-16 ENCOUNTER — Other Ambulatory Visit: Payer: Self-pay | Admitting: Family Medicine

## 2013-11-16 ENCOUNTER — Encounter: Payer: Self-pay | Admitting: Family Medicine

## 2013-11-16 ENCOUNTER — Ambulatory Visit (INDEPENDENT_AMBULATORY_CARE_PROVIDER_SITE_OTHER): Payer: Medicare Other | Admitting: Family Medicine

## 2013-11-16 VITALS — BP 132/68 | HR 67 | Temp 98.0°F | Ht 63.0 in | Wt 255.1 lb

## 2013-11-16 DIAGNOSIS — E785 Hyperlipidemia, unspecified: Secondary | ICD-10-CM

## 2013-11-16 DIAGNOSIS — G4730 Sleep apnea, unspecified: Secondary | ICD-10-CM

## 2013-11-16 DIAGNOSIS — I4891 Unspecified atrial fibrillation: Secondary | ICD-10-CM

## 2013-11-16 DIAGNOSIS — R195 Other fecal abnormalities: Secondary | ICD-10-CM

## 2013-11-16 DIAGNOSIS — I25810 Atherosclerosis of coronary artery bypass graft(s) without angina pectoris: Secondary | ICD-10-CM

## 2013-11-16 DIAGNOSIS — T7840XA Allergy, unspecified, initial encounter: Secondary | ICD-10-CM

## 2013-11-16 DIAGNOSIS — D649 Anemia, unspecified: Secondary | ICD-10-CM

## 2013-11-16 DIAGNOSIS — M5090 Cervical disc disorder, unspecified, unspecified cervical region: Secondary | ICD-10-CM | POA: Insufficient documentation

## 2013-11-16 DIAGNOSIS — R0602 Shortness of breath: Secondary | ICD-10-CM

## 2013-11-16 DIAGNOSIS — Z86718 Personal history of other venous thrombosis and embolism: Secondary | ICD-10-CM

## 2013-11-16 DIAGNOSIS — IMO0002 Reserved for concepts with insufficient information to code with codable children: Secondary | ICD-10-CM

## 2013-11-16 DIAGNOSIS — K5900 Constipation, unspecified: Secondary | ICD-10-CM

## 2013-11-16 DIAGNOSIS — M1990 Unspecified osteoarthritis, unspecified site: Secondary | ICD-10-CM

## 2013-11-16 DIAGNOSIS — I10 Essential (primary) hypertension: Secondary | ICD-10-CM

## 2013-11-16 DIAGNOSIS — E669 Obesity, unspecified: Secondary | ICD-10-CM

## 2013-11-16 HISTORY — DX: Reserved for concepts with insufficient information to code with codable children: IMO0002

## 2013-11-16 LAB — CBC
HCT: 26.5 % — ABNORMAL LOW (ref 36.0–46.0)
Hemoglobin: 8.2 g/dL — ABNORMAL LOW (ref 12.0–15.0)
MCH: 23.2 pg — ABNORMAL LOW (ref 26.0–34.0)
MCHC: 30.9 g/dL (ref 30.0–36.0)
MCV: 74.9 fL — ABNORMAL LOW (ref 78.0–100.0)
Platelets: 366 10*3/uL (ref 150–400)
RBC: 3.54 MIL/uL — ABNORMAL LOW (ref 3.87–5.11)
RDW: 15.9 % — AB (ref 11.5–15.5)
WBC: 6.3 10*3/uL (ref 4.0–10.5)

## 2013-11-16 LAB — HEPATIC FUNCTION PANEL
ALBUMIN: 4.1 g/dL (ref 3.5–5.2)
ALT: 21 U/L (ref 0–35)
AST: 21 U/L (ref 0–37)
Alkaline Phosphatase: 82 U/L (ref 39–117)
Bilirubin, Direct: 0.1 mg/dL (ref 0.0–0.3)
Indirect Bilirubin: 0.5 mg/dL (ref 0.2–1.2)
TOTAL PROTEIN: 6.5 g/dL (ref 6.0–8.3)
Total Bilirubin: 0.6 mg/dL (ref 0.2–1.2)

## 2013-11-16 LAB — RENAL FUNCTION PANEL
Albumin: 4.1 g/dL (ref 3.5–5.2)
BUN: 18 mg/dL (ref 6–23)
CHLORIDE: 104 meq/L (ref 96–112)
CO2: 29 meq/L (ref 19–32)
CREATININE: 1.02 mg/dL (ref 0.50–1.10)
Calcium: 9.3 mg/dL (ref 8.4–10.5)
GLUCOSE: 105 mg/dL — AB (ref 70–99)
POTASSIUM: 4.7 meq/L (ref 3.5–5.3)
Phosphorus: 4 mg/dL (ref 2.3–4.6)
Sodium: 141 mEq/L (ref 135–145)

## 2013-11-16 LAB — LIPID PANEL
CHOLESTEROL: 142 mg/dL (ref 0–200)
HDL: 51 mg/dL (ref >39)
LDL Cholesterol: 73 mg/dL (ref 0–99)
TRIGLYCERIDES: 90 mg/dL (ref <150)
Total CHOL/HDL Ratio: 2.8 Ratio
VLDL: 18 mg/dL (ref 0–40)

## 2013-11-16 NOTE — Progress Notes (Signed)
Pre visit review using our clinic review tool, if applicable. No additional management support is needed unless otherwise documented below in the visit note. 

## 2013-11-16 NOTE — Patient Instructions (Signed)
Probiotic daily such as Digestive Advantage or ALign or a generic .Encouraged increased hydration and fiber in diet. Daily probiotics. If bowels not moving can use MOM 2 tbls po in 4 oz of warm prune juice by mouth every 2-3 days. If no results then repeat in 4 hours with  Dulcolax suppository pr, may repeat again in 4 more hours as needed. Seek care if symptoms worsen. Consider daily Miralax and/or Dulcolax if symptoms persist.   Constipation, Adult Constipation is when a person has fewer than 3 bowel movements a week; has difficulty having a bowel movement; or has stools that are dry, hard, or larger than normal. As people grow older, constipation is more common. If you try to fix constipation with medicines that make you have a bowel movement (laxatives), the problem may get worse. Long-term laxative use may cause the muscles of the colon to become weak. A low-fiber diet, not taking in enough fluids, and taking certain medicines may make constipation worse. CAUSES   Certain medicines, such as antidepressants, pain medicine, iron supplements, antacids, and water pills.   Certain diseases, such as diabetes, irritable bowel syndrome (IBS), thyroid disease, or depression.   Not drinking enough water.   Not eating enough fiber-rich foods.   Stress or travel.  Lack of physical activity or exercise.  Not going to the restroom when there is the urge to have a bowel movement.  Ignoring the urge to have a bowel movement.  Using laxatives too much. SYMPTOMS   Having fewer than 3 bowel movements a week.   Straining to have a bowel movement.   Having hard, dry, or larger than normal stools.   Feeling full or bloated.   Pain in the lower abdomen.  Not feeling relief after having a bowel movement. DIAGNOSIS  Your caregiver will take a medical history and perform a physical exam. Further testing may be done for severe constipation. Some tests may include:   A barium enema X-ray to  examine your rectum, colon, and sometimes, your small intestine.  A sigmoidoscopy to examine your lower colon.  A colonoscopy to examine your entire colon. TREATMENT  Treatment will depend on the severity of your constipation and what is causing it. Some dietary treatments include drinking more fluids and eating more fiber-rich foods. Lifestyle treatments may include regular exercise. If these diet and lifestyle recommendations do not help, your caregiver may recommend taking over-the-counter laxative medicines to help you have bowel movements. Prescription medicines may be prescribed if over-the-counter medicines do not work.  HOME CARE INSTRUCTIONS   Increase dietary fiber in your diet, such as fruits, vegetables, whole grains, and beans. Limit high-fat and processed sugars in your diet, such as [place] fries, hamburgers, cookies, candies, and soda.   A fiber supplement may be added to your diet if you cannot get enough fiber from foods.   Drink enough fluids to keep your urine clear or pale yellow.   Exercise regularly or as directed by your caregiver.   Go to the restroom when you have the urge to go. Do not hold it.  Only take medicines as directed by your caregiver. Do not take other medicines for constipation without talking to your caregiver first. [place] IF:   You have bright red blood in your stool.   Your constipation lasts for more than 4 days or gets worse.   You have abdominal or rectal pain.   You have thin, pencil-like stools.  You have unexplained weight loss.  MAKE SURE YOU:   Understand these instructions.  Will watch your condition.  Will get help right away if you are not doing well or get worse. Document Released: 05/15/2004 Document Revised: 11/09/2011 Document Reviewed: 05/29/2013 [facility] Patient Information 2014 [place], [place].

## 2013-11-17 ENCOUNTER — Telehealth: Payer: Self-pay | Admitting: Internal Medicine

## 2013-11-17 ENCOUNTER — Telehealth: Payer: Self-pay | Admitting: Family Medicine

## 2013-11-17 LAB — IRON: IRON: 26 ug/dL — AB (ref 42–145)

## 2013-11-17 LAB — INTRINSIC FACTOR ANTIBODIES: INTRINSIC FACTOR: NEGATIVE

## 2013-11-17 LAB — FERRITIN: Ferritin: 15 ng/mL (ref 10–291)

## 2013-11-17 MED ORDER — FERROUS FUMARATE 325 (106 FE) MG PO TABS: 1.0000 | ORAL_TABLET | Freq: Every day | ORAL | Status: DC

## 2013-11-17 NOTE — Telephone Encounter (Signed)
Patient was started on Eliquis on 11/06/13 for A-fib and history of DVT. Left message for patient to call back   Dr. Rayann Heman patient is scheduled for an Upper Endoscopy and colonoscopy to evaluate her heme positive stools and anemia.  How long will she need to hold her eliquis that was started on 11/06/13?

## 2013-11-17 NOTE — Telephone Encounter (Signed)
Relevant patient education assigned to patient using Emmi. ° °

## 2013-11-17 NOTE — Telephone Encounter (Signed)
I have spoken with Dr. Jackalyn Lombard nurse Janan Halter.  Usually held for 24 hours prior to the procedure and 48 hours after.  Dr. Rayann Heman is not in the office and will return on Monday.  Claiborne Billings asked that I contact the patient and instruct her to hold eliquis until she hears from me or Dr. Jackalyn Lombard office.   I have left a message for the patient to call back

## 2013-11-18 ENCOUNTER — Other Ambulatory Visit: Payer: Self-pay | Admitting: Family Medicine

## 2013-11-18 DIAGNOSIS — D649 Anemia, unspecified: Secondary | ICD-10-CM

## 2013-11-19 ENCOUNTER — Encounter: Payer: Self-pay | Admitting: Family Medicine

## 2013-11-19 DIAGNOSIS — M1990 Unspecified osteoarthritis, unspecified site: Secondary | ICD-10-CM

## 2013-11-19 DIAGNOSIS — G4730 Sleep apnea, unspecified: Secondary | ICD-10-CM | POA: Insufficient documentation

## 2013-11-19 DIAGNOSIS — T7840XA Allergy, unspecified, initial encounter: Secondary | ICD-10-CM | POA: Insufficient documentation

## 2013-11-19 DIAGNOSIS — Z86718 Personal history of other venous thrombosis and embolism: Secondary | ICD-10-CM | POA: Insufficient documentation

## 2013-11-19 HISTORY — DX: Unspecified osteoarthritis, unspecified site: M19.90

## 2013-11-19 NOTE — Progress Notes (Signed)
Patient ID: [name], female   DOB: [birth year], 80 y.o.   MRN: [id number] Izora Benn [id number] Sep 28, 1933 11/19/2013      Progress Note-Follow Up  Subjective  Chief Complaint  Chief Complaint  Patient presents with  . Establish Care    new patient    HPI  Patient is a 80 year old female in today for routine medical care. In today for new patient appt. she is fairly new to the area and is in need of a primary care Dr. Unfortunately she's recently been struggling with severe anemia and rectal bleeding. She's been struggling with more difficulty with atrial fibrillation as well. She notes increased fatigue increased shortness of breath. She felt her shortness of breath worsened on amiodarone. They have recently decreased her tablets from 2-1 but she's not sure if that's helping at she moved into [place] in December of 2014. She was a complicated past medical history and was widowed 3 years. No acute complaints in the office had pulmonary function testing yesterday but does not know the results yet. Denies CP/palp/SOB/HA/congestion/fevers/GI or GU c/o. Taking meds as prescribed  Past Medical History  Diagnosis Date  . Hypertension   . Heart murmur   . Coronary artery disease   . Atrial fibrillation   . Anemia   . Arthritis   . Colon polyp   . Personal history of DVT (deep vein thrombosis)     x 2  . Hyperlipidemia   . Obesity   . Pancreatitis     post hysterectomy  . Sleep apnea   . Aortic stenosis     Very mild  . Hypercholesteremia   . OSA on CPAP   . PAF (paroxysmal atrial fibrillation)   . History of deep vein thrombophlebitis of lower extremity   . History of valvular heart disease     LEFT  . Chicken pox as a child  . Measles as a child  . Mumps child and teenager  . Allergy   . Obesity   . Unspecified constipation 11/16/2013  . Heme positive stool 11/16/2013  . DDD (degenerative disc disease) 11/16/2013  . History of DVT (deep vein thrombosis)  11/19/2013    Past Surgical History  Procedure Laterality Date  . Tonsilectomy, adenoidectomy, bilateral myringotomy and tubes  child  . Laminectomy  42 yrs ago  . Tubal ligation  80 years old  . Total abdominal hysterectomy  1995    had 2 tumors- benign  . Replacement total knee Left 2013  . Cyst removal hand Bilateral 20 yrs ago  . Meniscus repair Bilateral 2000 and 2010  . Appendectomy      Family History  Problem Relation Age of Onset  . Heart attack Mother 61  . Hyperlipidemia Mother     ?  Marland Kitchen Dementia Mother   . Pernicious anemia Mother   . Heart disease Mother 5    MI  . COPD Father     smoker  . Cancer Father 29    prostate  . Heart disease Brother     quadruple bipass surgery  . Hyperlipidemia Brother   . Hypertension Brother   . Diabetes Maternal Grandmother     type 2  . Pernicious anemia Maternal Grandmother   . Colon cancer Neg Hx   . Pancreatic cancer Neg Hx   . Stomach cancer Neg Hx   . Throat cancer Neg Hx   . Liver disease Neg Hx   . Gout Son   . Atrial  fibrillation Son   . Hyperlipidemia Son   . Cancer Maternal Grandfather     liver  . Cancer Paternal Grandmother     lung- doesn't think she smokes?  . Stroke Paternal Grandfather   . Cancer Son 50    non hodgin's lymphoma  . Gout Son   . Hyperlipidemia Son   . Sleep apnea Son     History   Social History  . Marital Status: Widowed    Spouse Name: N/A    Number of Children: N/A  . Years of Education: N/A   Occupational History  . Not on file.   Social History Main Topics  . Smoking status: Never Smoker   . Smokeless tobacco: Never Used  . Alcohol Use: No  . Drug Use: No  . Sexual Activity: No     Comment: lives at [place], low sodium diet   Other Topics Concern  . Not on file   Social History Narrative  . No narrative on file    Current Outpatient Prescriptions on File Prior to Visit  Medication Sig Dispense Refill  . amiodarone (PACERONE) 200 MG tablet Take 1  tablet (200 mg total) by mouth daily.      Marland Kitchen apixaban (ELIQUIS) 5 MG TABS tablet Take 1 tablet (5 mg total) by mouth 2 (two) times daily.  60 tablet  0  . atorvastatin (LIPITOR) 20 MG tablet Take 20 mg by mouth daily.      . bumetanide (BUMEX) 1 MG tablet Take 1 tablet by mouth daily.      . calcium carbonate (OS-CAL) 600 MG TABS tablet Take 600 mg by mouth 2 (two) times daily with a meal.      . KLOR-CON M10 10 MEQ tablet Take 1 tablet by mouth daily.      Marland Kitchen lisinopril (PRINIVIL,ZESTRIL) 10 MG tablet Take 10 mg by mouth daily.       Marland Kitchen loratadine (CLARITIN) 10 MG tablet Take 10 mg by mouth daily as needed.       . verapamil (COVERA HS) 180 MG (CO) 24 hr tablet Take 180 mg by mouth at bedtime.       No current facility-administered medications on file prior to visit.    Allergies  Allergen Reactions  . Sulfa Antibiotics   . Zebeta [Bisoprolol Fumarate]   . Penicillins Rash    Review of Systems  Review of Systems  Constitutional: Negative for fever and malaise/fatigue.  HENT: Negative for congestion.   Eyes: Negative for discharge.  Respiratory: Negative for shortness of breath.   Cardiovascular: Negative for chest pain, palpitations and leg swelling.  Gastrointestinal: Negative for nausea, abdominal pain and diarrhea.  Genitourinary: Negative for dysuria.  Musculoskeletal: Negative for falls.  Skin: Negative for rash.  Neurological: Negative for loss of consciousness and headaches.  Endo/Heme/Allergies: Negative for polydipsia.  Psychiatric/Behavioral: Negative for depression and suicidal ideas. The patient is not nervous/anxious and does not have insomnia.     Objective  BP 132/68  Pulse 67  Temp(Src) 98 F (36.7 C) (Oral)  Ht 5\' 3"  (1.6 m)  Wt 255 lb 1.3 oz (115.704 kg)  BMI 45.20 kg/m2  SpO2 95%  Physical Exam  Physical Exam  Constitutional: She is oriented to person, place, and time and well-developed, well-nourished, and in no distress. No distress.  HENT:   Head: Normocephalic and atraumatic.  Eyes: Conjunctivae are normal.  Neck: Neck supple. No thyromegaly present.  Cardiovascular: Normal rate, regular rhythm and normal heart sounds.  No murmur heard. Pulmonary/Chest: Effort normal and breath sounds normal. She has no wheezes.  Abdominal: She exhibits no distension and no mass.  Musculoskeletal: She exhibits no edema.  Lymphadenopathy:    She has no cervical adenopathy.  Neurological: She is alert and oriented to person, place, and time.  Skin: Skin is warm and dry. No rash noted. She is not diaphoretic.  Psychiatric: Memory, affect and judgment normal.    Lab Results  Component Value Date   TSH 6.31* 10/04/2013   Lab Results  Component Value Date   WBC 6.3 11/16/2013   HGB 8.2* 11/16/2013   HCT 26.5* 11/16/2013   MCV 74.9* 11/16/2013   PLT 366 11/16/2013   Lab Results  Component Value Date   CREATININE 1.02 11/16/2013   BUN 18 11/16/2013   NA 141 11/16/2013   K 4.7 11/16/2013   CL 104 11/16/2013   CO2 29 11/16/2013   Lab Results  Component Value Date   ALT 21 11/16/2013   AST 21 11/16/2013   ALKPHOS 82 11/16/2013   BILITOT 0.6 11/16/2013   Lab Results  Component Value Date   CHOL 142 11/16/2013   Lab Results  Component Value Date   HDL 51 11/16/2013   Lab Results  Component Value Date   LDLCALC 73 11/16/2013   Lab Results  Component Value Date   TRIG 90 11/16/2013   Lab Results  Component Value Date   CHOLHDL 2.8 11/16/2013     Assessment & Plan  Atrial fibrillation Rate controlled and appears to be tolerating Eliquis. Has appt with cardiology  Anemia Iron level improving slowly with supplementation. Has appt with Gastroenterology soon. Will refer to hematology for further consideration  Obesity Encouraged DASH diet, decrease po intake and increase exercise as tolerated. Needs 7-8 hours of sleep nightly. Avoid trans fats, eat small, frequent meals every 4-5 hours with lean proteins, complex carbs and healthy  fats. Minimize simple carbs, GMO foods.  Essential hypertension Well controlled, no changes to meds. Encouraged heart healthy diet such as the DASH diet and exercise as tolerated.   Unspecified constipation Encouraged increased hydration and fiber in diet. Daily probiotics. If bowels not moving can use MOM 2 tbls po in 4 oz of warm prune juice by mouth every 2-3 days. If no results then repeat in 4 hours with  Dulcolax suppository pr, may repeat again in 4 more hours as needed. Seek care if symptoms worsen. Consider daily Miralax and/or Dulcolax if symptoms persist.   Shortness of breath Worsened she believes since starting Amiodarone. They have recently cut down on her dosing. Had PFTs done yesterday.   Unspecified sleep apnea On Bipap

## 2013-11-19 NOTE — Assessment & Plan Note (Signed)
Worsened she believes since starting Amiodarone. They have recently cut down on her dosing. Had PFTs done yesterday.

## 2013-11-19 NOTE — Assessment & Plan Note (Signed)
On Bipap.

## 2013-11-19 NOTE — Assessment & Plan Note (Signed)
Rate controlled and appears to be tolerating Eliquis. Has appt with cardiology

## 2013-11-19 NOTE — Assessment & Plan Note (Signed)
Encouraged DASH diet, decrease po intake and increase exercise as tolerated. Needs 7-8 hours of sleep nightly. Avoid trans fats, eat small, frequent meals every 4-5 hours with lean proteins, complex carbs and healthy fats. Minimize simple carbs, GMO foods. 

## 2013-11-19 NOTE — Assessment & Plan Note (Signed)
Iron level improving slowly with supplementation. Has appt with Gastroenterology soon. Will refer to hematology for further consideration

## 2013-11-19 NOTE — Assessment & Plan Note (Signed)
Encouraged increased hydration and fiber in diet. Daily probiotics. If bowels not moving can use MOM 2 tbls po in 4 oz of warm prune juice by mouth every 2-3 days. If no results then repeat in 4 hours with  Dulcolax suppository pr, may repeat again in 4 more hours as needed. Seek care if symptoms worsen. Consider daily Miralax and/or Dulcolax if symptoms persist.  

## 2013-11-19 NOTE — Assessment & Plan Note (Signed)
Well controlled, no changes to meds. Encouraged heart healthy diet such as the DASH diet and exercise as tolerated.  °

## 2013-11-20 MED ORDER — FERROUS FUMARATE 325 (106 FE) MG PO TABS: 1.0000 | ORAL_TABLET | Freq: Every day | ORAL | Status: DC

## 2013-11-20 NOTE — Telephone Encounter (Signed)
Discussed with Dr Laverna Peace to hold Eliquis 24 hours prior to procedure ans resume immediately afterwards if able.

## 2013-11-20 NOTE — Telephone Encounter (Signed)
Patient notified to hold eliquis today and that she will be instructed by Dr. Hilarie Fredrickson when she can resume .  She verbalized understanding

## 2013-11-20 NOTE — Addendum Note (Signed)
Addended by: Varney Daily on: 11/20/2013 10:43 AM   Modules accepted: Orders

## 2013-11-21 ENCOUNTER — Encounter: Payer: Self-pay | Admitting: Internal Medicine

## 2013-11-21 ENCOUNTER — Ambulatory Visit (AMBULATORY_SURGERY_CENTER): Payer: Medicare Other | Admitting: Internal Medicine

## 2013-11-21 VITALS — BP 138/61 | HR 71 | Temp 98.7°F | Resp 30 | Ht 63.0 in | Wt 255.0 lb

## 2013-11-21 DIAGNOSIS — D509 Iron deficiency anemia, unspecified: Secondary | ICD-10-CM

## 2013-11-21 DIAGNOSIS — D126 Benign neoplasm of colon, unspecified: Secondary | ICD-10-CM

## 2013-11-21 DIAGNOSIS — D649 Anemia, unspecified: Secondary | ICD-10-CM

## 2013-11-21 DIAGNOSIS — Z86010 Personal history of colonic polyps: Secondary | ICD-10-CM

## 2013-11-21 DIAGNOSIS — R195 Other fecal abnormalities: Secondary | ICD-10-CM

## 2013-11-21 MED ORDER — SODIUM CHLORIDE 0.9 % IV SOLN: 500.0000 mL | INTRAVENOUS | Status: DC

## 2013-11-21 NOTE — Op Note (Signed)
[place]  [address]. [place], [id number]   ENDOSCOPY PROCEDURE REPORT  PATIENT: Tracy, Miles  MR#: [id number] BIRTHDATE: 1934/01/04 , 79  yrs. old GENDER: Female ENDOSCOPIST: Jerene Bears, MD REFERRED BY:  [name], M.D. PROCEDURE DATE:  11/21/2013 PROCEDURE:  EGD, diagnostic ASA CLASS:     Class III INDICATIONS:  Iron deficiency anemia.   Heme positive stool. MEDICATIONS: MAC sedation, administered by CRNA and propofol (Diprivan) 150mg  IV TOPICAL ANESTHETIC: none  DESCRIPTION OF PROCEDURE: After the risks benefits and alternatives of the procedure were thoroughly explained, informed consent was obtained.  The LB [id number] endoscope was introduced through the mouth and advanced to the second portion of the duodenum. Without limitations.  The instrument was slowly withdrawn as the mucosa was fully examined.      The upper, middle and distal third of the esophagus were carefully inspected and no abnormalities were noted.  The z-line was well seen at the GEJ.  The endoscope was pushed into the fundus which was normal including a retroflexed view.  The antrum, gastric body, first and second part of the duodenum were unremarkable. Retroflexed views revealed no abnormalities.     The scope was then withdrawn from the patient and the procedure completed.  COMPLICATIONS: There were no complications.  ENDOSCOPIC IMPRESSION: Normal EGD  RECOMMENDATIONS: Proceed with a Colonoscopy.  eSigned:  Jerene Bears, MD 11/21/2013 3:44 PM   CC:The Patient, [name], MD, and Trellis Paganini, Amy PA-C

## 2013-11-21 NOTE — Progress Notes (Signed)
Called to room to assist during endoscopic procedure.  Patient ID and intended procedure confirmed with present staff. Received instructions for my participation in the procedure from the performing physician.  

## 2013-11-21 NOTE — Patient Instructions (Signed)
YOU HAD AN ENDOSCOPIC PROCEDURE TODAY AT THE [facility]: Refer to the procedure report that was given to you for any specific questions about what was found during the examination.  If the procedure report does not answer your questions, please call your gastroenterologist to clarify.  If you requested that your care partner not be given the details of your procedure findings, then the procedure report has been included in a sealed envelope for you to review at your convenience later.  YOU SHOULD EXPECT: Some feelings of bloating in the abdomen. Passage of more gas than usual.  Walking can help get rid of the air that was put into your GI tract during the procedure and reduce the bloating. If you had a lower endoscopy (such as a colonoscopy or flexible sigmoidoscopy) you may notice spotting of blood in your stool or on the toilet paper. If you underwent a bowel prep for your procedure, then you may not have a normal bowel movement for a few days.  DIET: Your first meal following the procedure should be a light meal and then it is ok to progress to your normal diet.  A half-sandwich or bowl of soup is an example of a good first meal.  Heavy or fried foods are harder to digest and may make you feel nauseous or bloated.  Likewise meals heavy in dairy and vegetables can cause extra gas to form and this can also increase the bloating.  Drink plenty of fluids but you should avoid alcoholic beverages for 24 hours.  ACTIVITY: Your care partner should take you home directly after the procedure.  You should plan to take it easy, moving slowly for the rest of the day.  You can resume normal activity the day after the procedure however you should NOT DRIVE or use heavy machinery for 24 hours (because of the sedation medicines used during the test).    SYMPTOMS TO REPORT IMMEDIATELY: A gastroenterologist can be reached at any hour.  During normal business hours, 8:30 AM to 5:00 PM Monday through Friday,  call [phone].  After hours and on weekends, please call the GI answering service at [phone] who will take a message and have the physician on call contact you.   Following lower endoscopy (colonoscopy or flexible sigmoidoscopy):  Excessive amounts of blood in the stool  Significant tenderness or worsening of abdominal pains  Swelling of the abdomen that is new, acute  Fever of 100F or higher  Following upper endoscopy (EGD)  Vomiting of blood or coffee ground material  New chest pain or pain under the shoulder blades  Painful or persistently difficult swallowing  New shortness of breath  Fever of 100F or higher  Black, tarry-looking stools  FOLLOW UP: If any biopsies were taken you will be contacted by phone or by letter within the next 1-3 weeks.  Call your gastroenterologist if you have not heard about the biopsies in 3 weeks.  Our staff will call the home number listed on your records the next business day following your procedure to check on you and address any questions or concerns that you may have at that time regarding the information given to you following your procedure. This is a courtesy call and so if there is no answer at the home number and we have not heard from you through the emergency physician on call, we will assume that you have returned to your regular daily activities without incident.  SIGNATURES/CONFIDENTIALITY: You and/or your care   partner have signed paperwork which will be entered into your electronic medical record.  These signatures attest to the fact that that the information above on your After Visit Summary has been reviewed and is understood.  Full responsibility of the confidentiality of this discharge information lies with you and/or your care-partner.  Recommendations Await pathology results Repeat colonoscopy in 5 years Capsule Endoscopy Iron replacement and monitor hemoglobin Resume Eliquis tomorrow

## 2013-11-21 NOTE — Op Note (Signed)
[place]  [address]. [place], [id number]   COLONOSCOPY PROCEDURE REPORT  PATIENT: Tracy, Miles  MR#: [id number] BIRTHDATE: [birth year] , 79  yrs. old GENDER: Female ENDOSCOPIST: Jerene Bears, MD REFERRED MW:NUUVO Charlett Blake, M.D. PROCEDURE DATE:  11/21/2013 PROCEDURE:   Colonoscopy with snare polypectomy First Screening Colonoscopy - Avg.  risk and is 50 yrs.  old or older - No.  Prior Negative Screening - Now for repeat screening. N/A  History of Adenoma - Now for follow-up colonoscopy & has been > or = to 3 yrs.  Yes hx of adenoma.  Has been 3 or more years since last colonoscopy.  Polyps Removed Today? Yes. ASA CLASS:   Class III INDICATIONS:Iron Deficiency Anemia, heme-positive stool, and Patient's personal history of colon polyps. MEDICATIONS: MAC sedation, administered by CRNA and Propofol (Diprivan) 170 mg IV  DESCRIPTION OF PROCEDURE:   After the risks benefits and alternatives of the procedure were thoroughly explained, informed consent was obtained.  A digital rectal exam revealed small external hemorrhoids.   The LB [id number]  endoscope was introduced through the anus and advanced to the cecum, which was identified by both the appendix and ileocecal valve. No adverse events experienced.   The quality of the prep was good, using MoviPrep  The instrument was then slowly withdrawn as the colon was fully examined.   COLON FINDINGS: A sessile polyp measuring 5 mm in size was found at the hepatic flexure.  A polypectomy was performed with a cold snare.  The resection was complete and the polyp tissue was completely retrieved.   The colon was otherwise normal.  There was no diverticulosis, inflammation, or cancers seen.  Retroflexed views revealed small external hemorrhoids. The time to cecum=6 minutes 20 seconds.  Withdrawal time=10 minutes 59 seconds.  The scope was withdrawn and the procedure completed. COMPLICATIONS: There were no  complications.  ENDOSCOPIC IMPRESSION: 1.   Sessile polyp measuring 5 mm in size was found at the hepatic flexure; polypectomy was performed with a cold snare 2.   The colon was otherwise normal  RECOMMENDATIONS: 1.  Await pathology results 2.  Repeat Colonoscopy in 5 years. 3.  You will receive a letter within 1-2 weeks with the results of your biopsy as well as final recommendations.  Please call my office if you have not received a letter after 3 weeks. 4.  Capsule endoscopy for further work-up of iron deficiency anemia and heme + stools 5.  Iron replacement and monitoring of Hgb and iron stores to ensure improvement   eSigned:  Jerene Bears, MD 11/21/2013 3:48 PM      cc: The Patient, [name], MD, and Trellis Paganini, Amy PA-C

## 2013-11-21 NOTE — Progress Notes (Signed)
Report to pacu rn, vss, bbs=clear 

## 2013-11-22 ENCOUNTER — Other Ambulatory Visit: Payer: Self-pay

## 2013-11-22 ENCOUNTER — Telehealth: Payer: Self-pay | Admitting: *Deleted

## 2013-11-22 DIAGNOSIS — R195 Other fecal abnormalities: Secondary | ICD-10-CM

## 2013-11-22 DIAGNOSIS — D509 Iron deficiency anemia, unspecified: Secondary | ICD-10-CM

## 2013-11-22 NOTE — Telephone Encounter (Signed)
Message left for the patient

## 2013-11-24 ENCOUNTER — Ambulatory Visit: Payer: Medicare Other | Admitting: Family Medicine

## 2013-11-27 ENCOUNTER — Encounter: Payer: Self-pay | Admitting: Internal Medicine

## 2013-11-27 ENCOUNTER — Ambulatory Visit (HOSPITAL_COMMUNITY): Payer: Medicare Other | Attending: Cardiology | Admitting: Radiology

## 2013-11-27 ENCOUNTER — Telehealth: Payer: Self-pay | Admitting: Hematology & Oncology

## 2013-11-27 VITALS — BP 129/64 | Ht 63.0 in | Wt 255.0 lb

## 2013-11-27 DIAGNOSIS — I4891 Unspecified atrial fibrillation: Secondary | ICD-10-CM | POA: Insufficient documentation

## 2013-11-27 DIAGNOSIS — R5383 Other fatigue: Principal | ICD-10-CM

## 2013-11-27 DIAGNOSIS — R5381 Other malaise: Secondary | ICD-10-CM | POA: Insufficient documentation

## 2013-11-27 DIAGNOSIS — R0602 Shortness of breath: Secondary | ICD-10-CM

## 2013-11-27 MED ORDER — TECHNETIUM TC 99M SESTAMIBI GENERIC - CARDIOLITE
33.0000 | Freq: Once | INTRAVENOUS | Status: AC | PRN
  Administered 2013-11-27: 33 via INTRAVENOUS

## 2013-11-27 MED ORDER — REGADENOSON 0.4 MG/5ML IV SOLN
0.4000 mg | Freq: Once | INTRAVENOUS | Status: AC
  Administered 2013-11-27: 0.4 mg via INTRAVENOUS

## 2013-11-27 NOTE — Progress Notes (Signed)
[facility] [address] [place], [place] [address] [phone]    Cardiology Nuclear Med Study  Tracy Miles is a 80 y.o. female     MRN : [id number]     DOB: [birth year]  Procedure Date: 11/27/2013  Nuclear Med Background Indication for Stress Test:  Evaluation for Ischemia History:  2014 AFIB: multiple Cardioversions, 2015 ECHO: EF: 60-65% mild AS Cardiac Risk Factors: Family History - CAD, Hypertension and Lipids  Symptoms:  Fatigue and SOB   Nuclear Pre-Procedure Caffeine/Decaff Intake:  None NPO After: 7:00pm   Lungs:  clear O2 Sat: 97% on room air. IV 0.9% NS with Angio Cath:  22g  IV Site: L Antecubital  IV Started by:  Perrin Maltese, EMT-P  Chest Size (in):  44 Cup Size: C  Height: 5\' 3"  (1.6 m)  Weight:  255 lb (115.667 kg)  BMI:  Body mass index is 45.18 kg/(m^2). Tech Comments:  No Rx this am    Nuclear Med Study 1 or 2 day study: 2 day  Stress Test Type:  Treadmill/Lexiscan  Reading MD: n/a  Order Authorizing Provider:  J.Allred MD  Resting Radionuclide: Technetium 50m Sestamibi  Resting Radionuclide Dose: 33.0 mCi on 11/28/13   Stress Radionuclide:  Technetium 85m Sestamibi  Stress Radionuclide Dose: 33.0 mCi on 11/27/13           Stress Protocol Rest HR: 62 Stress HR: 114  Rest BP: 129/64 Stress BP: 143/51  Exercise Time (min): 5:31 METS: 4.70   Predicted Max HR: 143 bpm % Max HR: 79.72 bpm Rate Pressure Product: 16302   Dose of Adenosine (mg):  n/a Dose of Lexiscan: 0.4 mg  Dose of Atropine (mg): n/a Dose of Dobutamine: n/a mcg/kg/min (at max HR)  Stress Test Technologist: Perrin Maltese, EMT-P  Nuclear Technologist:  Charlton Amor, CNMT     Rest Procedure:  Myocardial perfusion imaging was performed at rest 45 minutes following the intravenous administration of Technetium 64m Sestamibi. Rest ECG: NSR - Normal EKG  Stress Procedure:  The patient received IV Lexiscan 0.4 mg over 15-seconds with concurrent  low level exercise and then Technetium 23m Sestamibi was injected at 30-seconds while the patient continued walking one more minute. This patient was sob with the Lexiscan injection and the treadmill. Quantitative spect images were obtained after a 45-minute delay. Stress ECG: 49mm horizontal ST depression inferiorly and laterally  QPS Raw Data Images:  There is a breast shadow that accounts for the anterior attenuation. Stress Images:  very small anteroseptal breast artifact Rest Images:  very small anteroseptal breast artifact Subtraction (SDS):  No evidence of ischemia. Transient Ischemic Dilatation (Normal <1.22):  0.92 Lung/Heart Ratio (Normal <0.45):  0.32  Quantitative Gated Spect Images QGS EDV:  102 ml QGS ESV:  25 ml  Impression Exercise Capacity:  Poor exercise capacity. BP Response:  Normal blood pressure response. Clinical Symptoms:  There is dyspnea. ECG Impression:  No significant ST segment change suggestive of ischemia. Comparison with Prior Nuclear Study: No previous nuclear study performed  Overall Impression:  Low risk stress nuclear study with small area of breast attenuation artifact. No ischemia.  LV Ejection Fraction: 75%.  LV Wall Motion:  NL LV Function; NL Wall Motion  Pixie Casino, MD, [facility] Board Certified in Nuclear Cardiology Attending Cardiologist [place]

## 2013-11-27 NOTE — Telephone Encounter (Signed)
Spoke w NEW PATIENT today to remind them of their appointment with Dr. Ennever. Also, advised them to bring all meds and insurance information. ° °

## 2013-11-28 ENCOUNTER — Ambulatory Visit (HOSPITAL_BASED_OUTPATIENT_CLINIC_OR_DEPARTMENT_OTHER): Payer: Medicare Other | Admitting: Hematology & Oncology

## 2013-11-28 ENCOUNTER — Other Ambulatory Visit (HOSPITAL_BASED_OUTPATIENT_CLINIC_OR_DEPARTMENT_OTHER): Payer: Medicare Other | Admitting: Lab

## 2013-11-28 ENCOUNTER — Ambulatory Visit (HOSPITAL_COMMUNITY): Payer: Medicare Other | Attending: Cardiology

## 2013-11-28 ENCOUNTER — Ambulatory Visit (HOSPITAL_BASED_OUTPATIENT_CLINIC_OR_DEPARTMENT_OTHER): Payer: Medicare Other

## 2013-11-28 ENCOUNTER — Ambulatory Visit: Payer: Medicare Other

## 2013-11-28 ENCOUNTER — Encounter: Payer: Self-pay | Admitting: *Deleted

## 2013-11-28 VITALS — BP 154/73 | HR 72 | Temp 97.4°F | Resp 20 | Ht 63.0 in | Wt 257.0 lb

## 2013-11-28 DIAGNOSIS — D509 Iron deficiency anemia, unspecified: Secondary | ICD-10-CM

## 2013-11-28 DIAGNOSIS — D649 Anemia, unspecified: Secondary | ICD-10-CM

## 2013-11-28 DIAGNOSIS — R0989 Other specified symptoms and signs involving the circulatory and respiratory systems: Secondary | ICD-10-CM

## 2013-11-28 LAB — CBC WITH DIFFERENTIAL (CANCER CENTER ONLY)
BASO#: 0 10*3/uL (ref 0.0–0.2)
BASO%: 0.3 % (ref 0.0–2.0)
EOS ABS: 0.2 10*3/uL (ref 0.0–0.5)
EOS%: 2.8 % (ref 0.0–7.0)
HCT: 29 % — ABNORMAL LOW (ref 34.8–46.6)
HEMOGLOBIN: 8.6 g/dL — AB (ref 11.6–15.9)
LYMPH#: 0.9 10*3/uL (ref 0.9–3.3)
LYMPH%: 11.7 % — ABNORMAL LOW (ref 14.0–48.0)
MCH: 24.5 pg — ABNORMAL LOW (ref 26.0–34.0)
MCHC: 29.7 g/dL — ABNORMAL LOW (ref 32.0–36.0)
MCV: 83 fL (ref 81–101)
MONO#: 0.5 10*3/uL (ref 0.1–0.9)
MONO%: 6.3 % (ref 0.0–13.0)
NEUT%: 78.9 % (ref 39.6–80.0)
NEUTROS ABS: 5.9 10*3/uL (ref 1.5–6.5)
Platelets: 279 10*3/uL (ref 145–400)
RBC: 3.51 10*6/uL — AB (ref 3.70–5.32)
RDW: 20.1 % — ABNORMAL HIGH (ref 11.1–15.7)
WBC: 7.5 10*3/uL (ref 3.9–10.0)

## 2013-11-28 LAB — IRON AND TIBC CHCC
%SAT: 7 % — AB (ref 21–57)
IRON: 34 ug/dL — AB (ref 41–142)
TIBC: 463 ug/dL — ABNORMAL HIGH (ref 236–444)
UIBC: 428 ug/dL — ABNORMAL HIGH (ref 120–384)

## 2013-11-28 LAB — CHCC SATELLITE - SMEAR

## 2013-11-28 LAB — FERRITIN CHCC: Ferritin: 32 ng/ml (ref 9–269)

## 2013-11-28 MED ORDER — METHYLPREDNISOLONE SODIUM SUCC 125 MG IJ SOLR
80.0000 mg | Freq: Once | INTRAMUSCULAR | Status: AC
  Administered 2013-11-28: 80 mg via INTRAVENOUS

## 2013-11-28 MED ORDER — TECHNETIUM TC 99M SESTAMIBI GENERIC - CARDIOLITE
33.0000 | Freq: Once | INTRAVENOUS | Status: AC | PRN
  Administered 2013-11-28: 33 via INTRAVENOUS

## 2013-11-28 MED ORDER — METHYLPREDNISOLONE SODIUM SUCC 125 MG IJ SOLR
INTRAMUSCULAR | Status: AC
  Filled 2013-11-28: qty 2

## 2013-11-28 MED ORDER — FAMOTIDINE IN NACL 20-0.9 MG/50ML-% IV SOLN
INTRAVENOUS | Status: AC
  Filled 2013-11-28: qty 50

## 2013-11-28 MED ORDER — SODIUM CHLORIDE 0.9 % IV SOLN
1020.0000 mg | Freq: Once | INTRAVENOUS | Status: AC
  Administered 2013-11-28: 1020 mg via INTRAVENOUS
  Filled 2013-11-28: qty 34

## 2013-11-28 MED ORDER — SODIUM CHLORIDE 0.9 % IV SOLN
Freq: Once | INTRAVENOUS | Status: AC
  Administered 2013-11-28: 10:00:00 via INTRAVENOUS

## 2013-11-28 MED ORDER — FAMOTIDINE IN NACL 20-0.9 MG/50ML-% IV SOLN
20.0000 mg | Freq: Two times a day (BID) | INTRAVENOUS | Status: DC
  Administered 2013-11-28: 20 mg via INTRAVENOUS

## 2013-11-28 NOTE — Patient Instructions (Signed)

## 2013-11-28 NOTE — Progress Notes (Signed)
Referral MD  Reason for Referral: Iron deficiency anemia   No chief complaint on file. : Iron is low. I feel tired.  HPI: Ms. Tracy Miles is a very charming 80 year old white female. She moved here from [facility]. She's been down here for a couple years.  She's been having problems with anemia. She had some rectal bleeding recently. She underwent colonoscopy and upper endoscopy. Both of these were essentially negative. I think there is a polyp in the colon that was biopsied. She is to undergo a capsule endoscopy in the next week or so.  She feels very tired. She does not have a lot of energy.  She's been seen by Dr. Randel Pigg . She had iron studies done couple weeks ago. Her ferritin was only 15. She had an intrinsic factor assay done. This was normal.  She had lab work done back 2 weeks ago. Her hemoglobin is 8.2 hematocrit is 26.5. MCV was 75. White cell count and platelets were normal.  She does have some hypothyroidism. Her TSH was slightly elevated back in February. This might be watched for now. I do not see that she is on any Synthroid.  She has been put on amiodarone. She feels that this is really making her feel tired. I told her that this is a 12 medicine to take. It can cause a lot of side effects.  She is on oral iron. I just don't think she will absorb iron all that well. She has history of atrial fibrillation. 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 She's on Eliquis. Again, she's not noted any obvious bleeding.  She is currently referred to was for an evaluation.  She does not chew ice. She does not have any mouth sores. She's had no obvious skin issues. There's been no weight loss or weight gain. She is not a vegetarian.   Past Medical History  Diagnosis Date  . Hypertension   . Heart murmur   . Coronary artery disease   . Atrial fibrillation   . Anemia   . Arthritis   . Colon polyp   . Personal history of DVT (deep vein thrombosis)     x 2  . Hyperlipidemia    . Obesity   . Pancreatitis     post hysterectomy  . Sleep apnea   . Aortic stenosis     Very mild  . Hypercholesteremia   . OSA on CPAP   . PAF (paroxysmal atrial fibrillation)   . History of deep vein thrombophlebitis of lower extremity   . History of valvular heart disease     LEFT  . Chicken pox as a child  . Measles as a child  . Mumps child and teenager  . Allergy   . Obesity   . Unspecified constipation 11/16/2013  . Heme positive stool 11/16/2013  . DDD (degenerative disc disease) 11/16/2013  . History of DVT (deep vein thrombosis) 11/19/2013  . Unspecified sleep apnea 11/19/2013  . Allergic state 11/19/2013  . OA (osteoarthritis) 11/19/2013    S/p L TKR  :  Past Surgical History  Procedure Laterality Date  . Tonsilectomy, adenoidectomy, bilateral myringotomy and tubes  child  . Laminectomy  40 yrs ago  . Tubal ligation  80 years old  . Total abdominal hysterectomy  1995    had 2 tumors- benign  . Replacement total knee Left 2013  . Cyst removal hand Bilateral 20 yrs ago  . Meniscus repair Bilateral 2000 and 2010  . Appendectomy    :  Current outpatient prescriptions:amiodarone (PACERONE) 200 MG tablet, Take 1 tablet (200 mg total) by mouth daily., Disp: , Rfl: ;  apixaban (ELIQUIS) 5 MG TABS tablet, Take 1 tablet (5 mg total) by mouth 2 (two) times daily., Disp: 60 tablet, Rfl: 0;  atorvastatin (LIPITOR) 20 MG tablet, Take 20 mg by mouth daily., Disp: , Rfl: ;  bumetanide (BUMEX) 1 MG tablet, Take 1 tablet by mouth daily., Disp: , Rfl:  calcium carbonate (OS-CAL) 600 MG TABS tablet, Take 600 mg by mouth 2 (two) times daily with a meal., Disp: , Rfl: ;  ferrous fumarate (HEMOCYTE - 106 MG FE) 325 (106 FE) MG TABS tablet, Take 1 tablet (106 mg of iron total) by mouth daily., Disp: 30 each, Rfl: 2;  KLOR-CON M10 10 MEQ tablet, Take 1 tablet by mouth daily., Disp: , Rfl: ;  lisinopril (PRINIVIL,ZESTRIL) 10 MG tablet, Take 10 mg by mouth daily. , Disp: , Rfl:  loratadine  (CLARITIN) 10 MG tablet, Take 10 mg by mouth daily as needed. , Disp: , Rfl: ;  psyllium (REGULOID) 0.52 G capsule, Take 0.52 g by mouth 2 (two) times daily., Disp: , Rfl: ;  verapamil (COVERA HS) 180 MG (CO) 24 hr tablet, Take 180 mg by mouth at bedtime., Disp: , Rfl:  Current facility-administered medications:famotidine (PEPCID) IVPB 20 mg, 20 mg, Intravenous, Q12H, Volanda Napoleon, MD, 20 mg at 11/28/13 0957:  . famotidine  20 mg Intravenous Q12H  :  Allergies  Allergen Reactions  . Sulfa Antibiotics   . Zebeta [Bisoprolol Fumarate]   . Penicillins Rash  :  Family History  Problem Relation Age of Onset  . Heart attack Mother 53  . Hyperlipidemia Mother     ?  Marland Kitchen Dementia Mother   . Pernicious anemia Mother   . Heart disease Mother 31    MI  . COPD Father     smoker  . Cancer Father 13    prostate  . Heart disease Brother     quadruple bipass surgery  . Hyperlipidemia Brother   . Hypertension Brother   . Diabetes Maternal Grandmother     type 2  . Pernicious anemia Maternal Grandmother   . Colon cancer Neg Hx   . Pancreatic cancer Neg Hx   . Stomach cancer Neg Hx   . Throat cancer Neg Hx   . Liver disease Neg Hx   . Gout Son   . Atrial fibrillation Son   . Hyperlipidemia Son   . Cancer Maternal Grandfather     liver  . Cancer Paternal Grandmother     lung- doesn't think she smokes?  . Stroke Paternal Grandfather   . Cancer Son 50    non hodgin's lymphoma  . Gout Son   . Hyperlipidemia Son   . Sleep apnea Son   :  History   Social History  . Marital Status: Widowed    Spouse Name: N/A    Number of Children: N/A  . Years of Education: N/A   Occupational History  . Not on file.   Social History Main Topics  . Smoking status: Never Smoker   . Smokeless tobacco: Never Used  . Alcohol Use: No  . Drug Use: No  . Sexual Activity: No     Comment: lives at [place], low sodium diet   Other Topics Concern  . Not on file   Social History Narrative   . No narrative on file  :  Pertinent items are noted  in HPI.  Exam: @IPVITALS @  somewhat obese white female in no obvious distress. Vital signs are temperature of 97 5. Pulse 83. Blood pressure 175/91. Weight is 172 pounds. Head and neck exam shows no ocular or oral lesions. Conjunctiva are pale. There is no tongue inflammation. There is no adenopathy on the neck. Thyroid is not obviously palpable. Lungs are clear bilaterally. Cardiac exam regular rate and rhythm. She has no murmurs rubs or bruits. Abdomen soft, slightly obese. She has good bowel sounds. There is no fluid wave. There is no palpable liver or spleen tip. Back exam no tenderness over the spine ribs or hips. Extremities shows some chronic mild nonpitting edema of the legs. She has some slightly more nonpitting edema of the left leg than the right leg. There is some slight increase in skin color of the left leg. No venous cord is noted. She has good strength. She is decent range of motion of her joints. Skin exam shows no ecchymosis or petechia. Neurological exam no focal deficits.   Recent Labs  11/28/13 0826  WBC 7.5  HGB 8.6*  HCT 29.0*  PLT 279   No results found for this basename: NA, K, CL, CO2, GLUCOSE, BUN, CREATININE, CALCIUM,  in the last 72 hours  Blood smear review: Slightly hypochromic and microcytic red cells. There are no target cells. I see no schistocytes. There are nucleated red cells. There is no rouleau formation. White cells are without hypersegmented polys. There is no atypical lymphocytes. Platelets are adequate number size.  Pathology: Not available     Assessment and Plan: Ms. Tracy Miles is a very nice 80 year old female with anemia. She's iron deficiency. 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 She is on blood thinner.  We will go ahead and give her iron today. I don't see a problem with give her IV iron. I told her to stop the oral iron. This just is not going to help  her. It has not helped her so far so I do not see that it will help her in the future.  She was may have some degree of anemia because of her medications.  Next him I see her, I will check her erythropoietin level. It would not surprise me if this was low.  I want to see her back in another 5 weeks or so.  I spent a good 45 minutes with her today.

## 2013-11-29 ENCOUNTER — Telehealth: Payer: Self-pay | Admitting: Internal Medicine

## 2013-11-29 NOTE — Telephone Encounter (Signed)
I have left a message for the patient that she should remain on eliquis for capsule.  She is asked to call back for any additional questions or concerns

## 2013-11-30 LAB — HEMOGLOBINOPATHY EVALUATION
HEMOGLOBIN OTHER: 0 %
HGB A2 QUANT: 1.9 % — AB (ref 2.2–3.2)
HGB A: 98.1 % — AB (ref 96.8–97.8)
HGB F QUANT: 0 % (ref 0.0–2.0)
HGB S QUANTITAION: 0 %

## 2013-11-30 LAB — RETICULOCYTES (CHCC)
ABS RETIC: 100.5 10*3/uL (ref 19.0–186.0)
RBC.: 3.59 MIL/uL — ABNORMAL LOW (ref 3.87–5.11)
RETIC CT PCT: 2.8 % — AB (ref 0.4–2.3)

## 2013-12-05 ENCOUNTER — Ambulatory Visit (INDEPENDENT_AMBULATORY_CARE_PROVIDER_SITE_OTHER): Payer: Medicare Other | Admitting: Internal Medicine

## 2013-12-05 DIAGNOSIS — K5521 Angiodysplasia of colon with hemorrhage: Secondary | ICD-10-CM

## 2013-12-05 DIAGNOSIS — D500 Iron deficiency anemia secondary to blood loss (chronic): Secondary | ICD-10-CM

## 2013-12-05 NOTE — Progress Notes (Signed)
Patient here for capsule endoscopy she verbalized understanding of all written and verbal instructions.  Took capsule and tolerated well.   Capsule ID [id number] lot [id number] exp 10/2014

## 2013-12-06 ENCOUNTER — Telehealth: Payer: Self-pay | Admitting: Family Medicine

## 2013-12-06 NOTE — Telephone Encounter (Signed)
Received medical records from [facility]

## 2013-12-14 ENCOUNTER — Ambulatory Visit (INDEPENDENT_AMBULATORY_CARE_PROVIDER_SITE_OTHER): Payer: Medicare Other | Admitting: Family Medicine

## 2013-12-14 ENCOUNTER — Encounter: Payer: Self-pay | Admitting: Family Medicine

## 2013-12-14 VITALS — BP 140/68 | HR 82 | Temp 98.2°F | Ht 63.0 in | Wt 258.0 lb

## 2013-12-14 DIAGNOSIS — R946 Abnormal results of thyroid function studies: Secondary | ICD-10-CM

## 2013-12-14 DIAGNOSIS — T7840XA Allergy, unspecified, initial encounter: Secondary | ICD-10-CM

## 2013-12-14 DIAGNOSIS — I10 Essential (primary) hypertension: Secondary | ICD-10-CM

## 2013-12-14 DIAGNOSIS — K5900 Constipation, unspecified: Secondary | ICD-10-CM

## 2013-12-14 DIAGNOSIS — E669 Obesity, unspecified: Secondary | ICD-10-CM

## 2013-12-14 DIAGNOSIS — E785 Hyperlipidemia, unspecified: Secondary | ICD-10-CM

## 2013-12-14 DIAGNOSIS — D649 Anemia, unspecified: Secondary | ICD-10-CM

## 2013-12-14 DIAGNOSIS — H8110 Benign paroxysmal vertigo, unspecified ear: Secondary | ICD-10-CM

## 2013-12-14 DIAGNOSIS — I4891 Unspecified atrial fibrillation: Secondary | ICD-10-CM

## 2013-12-14 LAB — CBC
HCT: 34.5 % — ABNORMAL LOW (ref 36.0–46.0)
HEMOGLOBIN: 11 g/dL — AB (ref 12.0–15.0)
MCH: 26.5 pg (ref 26.0–34.0)
MCHC: 31.9 g/dL (ref 30.0–36.0)
MCV: 83.1 fL (ref 78.0–100.0)
Platelets: 268 10*3/uL (ref 150–400)
RBC: 4.15 MIL/uL (ref 3.87–5.11)
RDW: 25.6 % — AB (ref 11.5–15.5)
WBC: 5.8 10*3/uL (ref 4.0–10.5)

## 2013-12-14 MED ORDER — POTASSIUM CHLORIDE CRYS ER 10 MEQ PO TBCR: 10.0000 meq | EXTENDED_RELEASE_TABLET | Freq: Every day | ORAL | Status: DC

## 2013-12-14 MED ORDER — VERAPAMIL HCL 180 MG (CO) PO TB24: 180.0000 mg | ORAL_TABLET | Freq: Every day | ORAL | Status: DC

## 2013-12-14 MED ORDER — ATORVASTATIN CALCIUM 20 MG PO TABS: 20.0000 mg | ORAL_TABLET | Freq: Every day | ORAL | Status: DC

## 2013-12-14 MED ORDER — BUMETANIDE 1 MG PO TABS: 1.0000 mg | ORAL_TABLET | Freq: Every day | ORAL | Status: DC

## 2013-12-14 MED ORDER — LISINOPRIL 10 MG PO TABS: 10.0000 mg | ORAL_TABLET | Freq: Every day | ORAL | Status: DC

## 2013-12-14 NOTE — Patient Instructions (Signed)

## 2013-12-14 NOTE — Progress Notes (Signed)
Pre visit review using our clinic review tool, if applicable. No additional management support is needed unless otherwise documented below in the visit note. 

## 2013-12-15 ENCOUNTER — Telehealth: Payer: Self-pay | Admitting: Internal Medicine

## 2013-12-15 NOTE — Telephone Encounter (Signed)
I have reviewed the preliminary results with the patient and that we will call back once Dr. Hilarie Fredrickson has reviewed the entire study

## 2013-12-17 ENCOUNTER — Encounter: Payer: Self-pay | Admitting: Family Medicine

## 2013-12-17 DIAGNOSIS — R946 Abnormal results of thyroid function studies: Secondary | ICD-10-CM | POA: Insufficient documentation

## 2013-12-17 DIAGNOSIS — H8110 Benign paroxysmal vertigo, unspecified ear: Secondary | ICD-10-CM

## 2013-12-17 HISTORY — DX: Benign paroxysmal vertigo, unspecified ear: H81.10

## 2013-12-17 NOTE — Progress Notes (Signed)
Patient ID: Tracy Miles, female   DOB: 02-Oct-1933, 80 y.o.   MRN: [id number] Tracy Miles [id number] 12-09-1933 12/17/2013      Progress Note-Follow Up  Subjective  Chief Complaint  Chief Complaint  Patient presents with  . Follow-up    4 week    HPI  Patient is a 80 year old female in today for routine medical care. She is noting some persistent vertigo. She has a sense of disequilibrium frequently. Note that she's had trouble off and on for years and has previously had her workup. Has been flared somewhat with her allergies. No fevers or chills. No headache or recent illness. Lives at [place]. Denies CP/palp/SOB/HA/congestion/fevers/GI or GU c/o. Taking meds as prescribed  Past Medical History  Diagnosis Date  . Hypertension   . Heart murmur   . Coronary artery disease   . Atrial fibrillation   . Anemia   . Arthritis   . Colon polyp   . Personal history of DVT (deep vein thrombosis)     x 2  . Hyperlipidemia   . Obesity   . Pancreatitis     post hysterectomy  . Sleep apnea   . Aortic stenosis     Very mild  . Hypercholesteremia   . OSA on CPAP   . PAF (paroxysmal atrial fibrillation)   . History of deep vein thrombophlebitis of lower extremity   . History of valvular heart disease     LEFT  . Chicken pox as a child  . Measles as a child  . Mumps child and teenager  . Allergy   . Obesity   . Unspecified constipation 11/16/2013  . Heme positive stool 11/16/2013  . DDD (degenerative disc disease) 11/16/2013  . History of DVT (deep vein thrombosis) 11/19/2013  . Unspecified sleep apnea 11/19/2013  . Allergic state 11/19/2013  . OA (osteoarthritis) 11/19/2013    S/p L TKR  . Benign paroxysmal positional vertigo 12/17/2013    Past Surgical History  Procedure Laterality Date  . Tonsilectomy, adenoidectomy, bilateral myringotomy and tubes  child  . Laminectomy  40 yrs ago  . Tubal ligation  80 years old  . Total abdominal hysterectomy  1995    had 2  tumors- benign  . Replacement total knee Left 2013  . Cyst removal hand Bilateral 20 yrs ago  . Meniscus repair Bilateral 2000 and 2010  . Appendectomy      Family History  Problem Relation Age of Onset  . Heart attack Mother 64  . Hyperlipidemia Mother     ?  Marland Kitchen Dementia Mother   . Pernicious anemia Mother   . Heart disease Mother 64    MI  . COPD Father     smoker  . Cancer Father 69    prostate  . Heart disease Brother     quadruple bipass surgery  . Hyperlipidemia Brother   . Hypertension Brother   . Diabetes Maternal Grandmother     type 2  . Pernicious anemia Maternal Grandmother   . Colon cancer Neg Hx   . Pancreatic cancer Neg Hx   . Stomach cancer Neg Hx   . Throat cancer Neg Hx   . Liver disease Neg Hx   . Gout Son   . Atrial fibrillation Son   . Hyperlipidemia Son   . Cancer Maternal Grandfather     liver  . Cancer Paternal Grandmother     lung- doesn't think she smokes?  . Stroke Paternal Grandfather   .  Cancer Son 50    non hodgin's lymphoma  . Gout Son   . Hyperlipidemia Son   . Sleep apnea Son     History   Social History  . Marital Status: Widowed    Spouse Name: N/A    Number of Children: N/A  . Years of Education: N/A   Occupational History  . Not on file.   Social History Main Topics  . Smoking status: Never Smoker   . Smokeless tobacco: Never Used  . Alcohol Use: No  . Drug Use: No  . Sexual Activity: No     Comment: lives at [place], low sodium diet   Other Topics Concern  . Not on file   Social History Narrative  . No narrative on file    Current Outpatient Prescriptions on File Prior to Visit  Medication Sig Dispense Refill  . amiodarone (PACERONE) 200 MG tablet Take 1 tablet (200 mg total) by mouth daily.      Marland Kitchen apixaban (ELIQUIS) 5 MG TABS tablet Take 1 tablet (5 mg total) by mouth 2 (two) times daily.  60 tablet  0  . calcium carbonate (OS-CAL) 600 MG TABS tablet Take 600 mg by mouth 2 (two) times daily with a  meal.      . loratadine (CLARITIN) 10 MG tablet Take 10 mg by mouth daily as needed.       . psyllium (REGULOID) 0.52 G capsule Take 0.52 g by mouth 2 (two) times daily.       No current facility-administered medications on file prior to visit.    Allergies  Allergen Reactions  . Sulfa Antibiotics   . Zebeta [Bisoprolol Fumarate]   . Penicillins Rash    Review of Systems  Review of Systems  Constitutional: Negative for fever and malaise/fatigue.  HENT: Negative for congestion.   Eyes: Negative for discharge.  Respiratory: Negative for shortness of breath.   Cardiovascular: Negative for chest pain, palpitations and leg swelling.  Gastrointestinal: Negative for nausea, abdominal pain and diarrhea.  Genitourinary: Negative for dysuria.  Musculoskeletal: Negative for falls.  Skin: Negative for rash.  Neurological: Positive for dizziness. Negative for loss of consciousness and headaches.  Endo/Heme/Allergies: Negative for polydipsia.  Psychiatric/Behavioral: Negative for depression and suicidal ideas. The patient is not nervous/anxious and does not have insomnia.     Objective  BP 140/68  Pulse 82  Temp(Src) 98.2 F (36.8 C) (Oral)  Ht 5\' 3"  (1.6 m)  Wt 258 lb (117.028 kg)  BMI 45.71 kg/m2  SpO2 98%  Physical Exam  Physical Exam  Constitutional: She is oriented to person, place, and time and well-developed, well-nourished, and in no distress. No distress.  HENT:  Head: Normocephalic and atraumatic.  Eyes: Conjunctivae are normal.  Neck: Neck supple. No thyromegaly present.  Cardiovascular: Normal rate, regular rhythm and normal heart sounds.   No murmur heard. Pulmonary/Chest: Effort normal and breath sounds normal. She has no wheezes.  Abdominal: She exhibits no distension and no mass.  Musculoskeletal: She exhibits no edema.  Lymphadenopathy:    She has no cervical adenopathy.  Neurological: She is alert and oriented to person, place, and time.  Skin: Skin is  warm and dry. No rash noted. She is not diaphoretic.  Psychiatric: Memory, affect and judgment normal.    Lab Results  Component Value Date   TSH 6.31* 10/04/2013   Lab Results  Component Value Date   WBC 5.8 12/14/2013   HGB 11.0* 12/14/2013   HCT 34.5*  12/14/2013   MCV 83.1 12/14/2013   PLT 268 12/14/2013   Lab Results  Component Value Date   CREATININE 1.02 11/16/2013   BUN 18 11/16/2013   NA 141 11/16/2013   K 4.7 11/16/2013   CL 104 11/16/2013   CO2 29 11/16/2013   Lab Results  Component Value Date   ALT 21 11/16/2013   AST 21 11/16/2013   ALKPHOS 82 11/16/2013   BILITOT 0.6 11/16/2013   Lab Results  Component Value Date   CHOL 142 11/16/2013   Lab Results  Component Value Date   HDL 51 11/16/2013   Lab Results  Component Value Date   LDLCALC 73 11/16/2013   Lab Results  Component Value Date   TRIG 90 11/16/2013   Lab Results  Component Value Date   CHOLHDL 2.8 11/16/2013     Assessment & Plan  Obesity Encouraged DASH diet, decrease po intake and increase exercise as tolerated. Needs 7-8 hours of sleep nightly. Avoid trans fats, eat small, frequent meals every 4-5 hours with lean proteins, complex carbs and healthy fats. Minimize simple carbs, GMO foods.  Other and unspecified hyperlipidemia Tolerating statin, encouraged heart healthy diet, avoid trans fats, minimize simple carbs and saturated fats. Increase exercise as tolerated  Unspecified constipation Encouraged increased hydration and fiber in diet. Daily probiotics. If bowels not moving can use MOM 2 tbls po in 4 oz of warm prune juice by mouth every 2-3 days. If no results then repeat in 4 hours with  Dulcolax suppository pr, may repeat again in 4 more hours as needed. Seek care if symptoms worsen. Consider daily Miralax and/or Dulcolax if symptoms persist.   Essential hypertension Well controlled, no changes to meds. Encouraged heart healthy diet such as the DASH diet and exercise as tolerated.   Benign  paroxysmal positional vertigo Long history, has been evaluated by imaging and ENT in past. [place], encouraged adequate hydration and report if symptoms worsen  Allergic state Encouraged Claritin daily  Anemia Mild, Increase leafy greens, consider increased lean red meat and using cast iron cookware. Continue to monitor, report any concerns  Atrial fibrillation Rate controlled  Abnormal results of thyroid function studies Mildly elevated tsh will monitor

## 2013-12-17 NOTE — Assessment & Plan Note (Signed)
Mildly elevated tsh will monitor

## 2013-12-17 NOTE — Assessment & Plan Note (Signed)
Mild, Increase leafy greens, consider increased lean red meat and using cast iron cookware. Continue to monitor, report any concerns 

## 2013-12-17 NOTE — Assessment & Plan Note (Signed)
Encouraged increased hydration and fiber in diet. Daily probiotics. If bowels not moving can use MOM 2 tbls po in 4 oz of warm prune juice by mouth every 2-3 days. If no results then repeat in 4 hours with  Dulcolax suppository pr, may repeat again in 4 more hours as needed. Seek care if symptoms worsen. Consider daily Miralax and/or Dulcolax if symptoms persist.  

## 2013-12-17 NOTE — Assessment & Plan Note (Signed)
Well controlled, no changes to meds. Encouraged heart healthy diet such as the DASH diet and exercise as tolerated.  °

## 2013-12-17 NOTE — Assessment & Plan Note (Signed)
Rate controlled 

## 2013-12-17 NOTE — Assessment & Plan Note (Signed)
Tolerating statin, encouraged heart healthy diet, avoid trans fats, minimize simple carbs and saturated fats. Increase exercise as tolerated 

## 2013-12-17 NOTE — Assessment & Plan Note (Signed)
Encouraged DASH diet, decrease po intake and increase exercise as tolerated. Needs 7-8 hours of sleep nightly. Avoid trans fats, eat small, frequent meals every 4-5 hours with lean proteins, complex carbs and healthy fats. Minimize simple carbs, GMO foods. 

## 2013-12-17 NOTE — Assessment & Plan Note (Signed)
Encouraged Claritin daily

## 2013-12-17 NOTE — Assessment & Plan Note (Signed)
Long history, has been evaluated by imaging and ENT in past. [place], encouraged adequate hydration and report if symptoms worsen

## 2013-12-20 ENCOUNTER — Telehealth: Payer: Self-pay | Admitting: Internal Medicine

## 2013-12-20 NOTE — Telephone Encounter (Signed)
I have reviewed the results with the patient.  She is aware to remain on long term iron replacement and have her CBC monitored.  She states she has follow up set up with Dr. Charlett Blake and her hematologist.

## 2013-12-21 ENCOUNTER — Ambulatory Visit: Payer: Self-pay | Admitting: Family Medicine

## 2013-12-21 ENCOUNTER — Encounter: Payer: Self-pay | Admitting: Internal Medicine

## 2013-12-22 ENCOUNTER — Encounter: Payer: Self-pay | Admitting: Internal Medicine

## 2013-12-22 ENCOUNTER — Ambulatory Visit (INDEPENDENT_AMBULATORY_CARE_PROVIDER_SITE_OTHER): Payer: Medicare Other | Admitting: Internal Medicine

## 2013-12-22 VITALS — BP 158/76 | HR 70 | Ht 63.5 in | Wt 259.0 lb

## 2013-12-22 DIAGNOSIS — I4891 Unspecified atrial fibrillation: Secondary | ICD-10-CM

## 2013-12-22 DIAGNOSIS — I10 Essential (primary) hypertension: Secondary | ICD-10-CM

## 2013-12-22 DIAGNOSIS — R0602 Shortness of breath: Secondary | ICD-10-CM

## 2013-12-22 MED ORDER — AMIODARONE HCL 100 MG PO TABS
100.0000 mg | ORAL_TABLET | Freq: Every day | ORAL | Status: DC
Start: 2013-12-22 — End: 2013-12-22

## 2013-12-22 MED ORDER — APIXABAN 5 MG PO TABS: 5.0000 mg | ORAL_TABLET | Freq: Two times a day (BID) | ORAL | Status: DC

## 2013-12-22 MED ORDER — AMIODARONE HCL 100 MG PO TABS: 100.0000 mg | ORAL_TABLET | Freq: Every day | ORAL | Status: DC

## 2013-12-22 NOTE — Progress Notes (Signed)
PCP:  [name], MD Primary Cardiologist:  Dr Meda Coffee  The patient presents today for routine electrophysiology followup.  Since last being seen in our clinic, the patient reports doing well.  With treatment of her anemia, her SOB is much improved.  She is pleased with her progress.  Today, she denies symptoms of palpitations, chest pain,   orthopnea, PND, lower extremity edema, dizziness, presyncope, syncope, or neurologic sequela.  The patient feels that she is tolerating medications without difficulties and is otherwise without complaint today.   Past Medical History  Diagnosis Date  . Hypertension   . Heart murmur   . Coronary artery disease   . Atrial fibrillation   . Anemia   . Arthritis   . Colon polyp   . Personal history of DVT (deep vein thrombosis)     x 2  . Hyperlipidemia   . Obesity   . Pancreatitis     post hysterectomy  . Sleep apnea   . Aortic stenosis     Very mild  . Hypercholesteremia   . OSA on CPAP   . PAF (paroxysmal atrial fibrillation)   . History of deep vein thrombophlebitis of lower extremity   . History of valvular heart disease     LEFT  . Chicken pox as a child  . Measles as a child  . Mumps child and teenager  . Allergy   . Obesity   . Unspecified constipation 11/16/2013  . Heme positive stool 11/16/2013  . DDD (degenerative disc disease) 11/16/2013  . History of DVT (deep vein thrombosis) 11/19/2013  . Unspecified sleep apnea 11/19/2013  . Allergic state 11/19/2013  . OA (osteoarthritis) 11/19/2013    S/p L TKR  . Benign paroxysmal positional vertigo 12/17/2013  . Abnormal results of thyroid function studies 12/17/2013   Past Surgical History  Procedure Laterality Date  . Tonsilectomy, adenoidectomy, bilateral myringotomy and tubes  child  . Laminectomy  42 yrs ago  . Tubal ligation  80 years old  . Total abdominal hysterectomy  1995    had 2 tumors- benign  . Replacement total knee Left 2013  . Cyst removal hand Bilateral 20 yrs ago   . Meniscus repair Bilateral 2000 and 2010  . Appendectomy      Current Outpatient Prescriptions  Medication Sig Dispense Refill  . amiodarone (PACERONE) 100 MG tablet Take 1 tablet (100 mg total) by mouth daily.  90 tablet  1  . apixaban (ELIQUIS) 5 MG TABS tablet Take 1 tablet (5 mg total) by mouth 2 (two) times daily.  180 tablet  1  . atorvastatin (LIPITOR) 20 MG tablet Take 1 tablet (20 mg total) by mouth daily.  90 tablet  1  . bumetanide (BUMEX) 1 MG tablet Take 1 tablet (1 mg total) by mouth daily.  90 tablet  1  . calcium carbonate (OS-CAL) 600 MG TABS tablet Take 600 mg by mouth 2 (two) times daily with a meal.      . lisinopril (PRINIVIL,ZESTRIL) 10 MG tablet Take 1 tablet (10 mg total) by mouth daily.  90 tablet  1  . loratadine (CLARITIN) 10 MG tablet Take 10 mg by mouth daily as needed.       . potassium chloride (KLOR-CON M10) 10 MEQ tablet Take 1 tablet (10 mEq total) by mouth daily.  90 tablet  1  . psyllium (REGULOID) 0.52 G capsule Take 0.52 g by mouth 2 (two) times daily.      . verapamil (COVERA  HS) 180 MG (CO) 24 hr tablet Take 1 tablet (180 mg total) by mouth at bedtime.  90 tablet  1   No current facility-administered medications for this visit.    Allergies  Allergen Reactions  . Sulfa Antibiotics   . Zebeta [Bisoprolol Fumarate]   . Penicillins Rash    History   Social History  . Marital Status: Widowed    Spouse Name: N/A    Number of Children: N/A  . Years of Education: N/A   Occupational History  . Not on file.   Social History Main Topics  . Smoking status: Never Smoker   . Smokeless tobacco: Never Used  . Alcohol Use: No  . Drug Use: No  . Sexual Activity: No     Comment: lives at [place], low sodium diet   Other Topics Concern  . Not on file   Social History Narrative  . No narrative on file    Family History  Problem Relation Age of Onset  . Heart attack Mother 72  . Hyperlipidemia Mother     ?  Marland Kitchen Dementia Mother   .  Pernicious anemia Mother   . Heart disease Mother 6    MI  . COPD Father     smoker  . Cancer Father 54    prostate  . Heart disease Brother     quadruple bipass surgery  . Hyperlipidemia Brother   . Hypertension Brother   . Diabetes Maternal Grandmother     type 2  . Pernicious anemia Maternal Grandmother   . Colon cancer Neg Hx   . Pancreatic cancer Neg Hx   . Stomach cancer Neg Hx   . Throat cancer Neg Hx   . Liver disease Neg Hx   . Gout Son   . Atrial fibrillation Son   . Hyperlipidemia Son   . Cancer Maternal Grandfather     liver  . Cancer Paternal Grandmother     lung- doesn't think she smokes?  . Stroke Paternal Grandfather   . Cancer Son 50    non hodgin's lymphoma  . Gout Son   . Hyperlipidemia Son   . Sleep apnea Son     ROS-  All systems are reviewed and are negative except as outlined in the HPI above  Physical Exam: Filed Vitals:   12/22/13 1126  BP: 158/76  Pulse: 70  Height: 5' 3.5" (1.613 m)  Weight: 259 lb (117.482 kg)    GEN- The patient is overweight appearing, alert and oriented x 3 today.   Head- normocephalic, atraumatic Eyes-  Sclera clear, conjunctiva pink Ears- hearing intact Oropharynx- clear Neck- supple,  Lungs- Clear to ausculation bilaterally, normal work of breathing Heart- Regular rate and rhythm, no murmurs, rubs or gallops, PMI not laterally displaced GI- soft, NT, ND, + BS Extremities- no clubbing, cyanosis, or edema MS- no significant deformity or atrophy Skin- no rash or lesion Psych- euthymic mood, full affect Neuro- strength and sensation are intact  ekg today reveals sinus rhythm 70 bpm, PR 206, QTc 449, otherwise normal ekg Epic results including labs are reviewed today  Assessment and Plan:   1. SOB/ fatigue  Likely due to anemia primarily Pfts/ myoview are reviewed  2. afib  Doing reasonably well Decrease amiodarone to 100mg  daily today Continue anticoagulation She would like to avoid ablation if  possible Given her multiple comorbidities, I think that this is best at this time  3. htn  Stable  No change required today  4. Anemia  As above   Return to see me in 3 months

## 2013-12-22 NOTE — Patient Instructions (Signed)
Your physician has recommended you make the following change in your medication:  1) DECREASE Amiodarone to 100 mg daily  Your physician recommends that you schedule a follow-up appointment in: 3 months with Dr. Rayann Heman.

## 2014-01-03 ENCOUNTER — Ambulatory Visit: Payer: Medicare Other

## 2014-01-03 ENCOUNTER — Ambulatory Visit (HOSPITAL_BASED_OUTPATIENT_CLINIC_OR_DEPARTMENT_OTHER): Payer: Medicare Other | Admitting: Hematology & Oncology

## 2014-01-03 ENCOUNTER — Other Ambulatory Visit (HOSPITAL_BASED_OUTPATIENT_CLINIC_OR_DEPARTMENT_OTHER): Payer: Medicare Other | Admitting: Lab

## 2014-01-03 ENCOUNTER — Encounter: Payer: Self-pay | Admitting: Hematology & Oncology

## 2014-01-03 VITALS — BP 142/53 | HR 66 | Temp 98.2°F | Resp 16 | Ht 63.0 in | Wt 257.0 lb

## 2014-01-03 DIAGNOSIS — D649 Anemia, unspecified: Secondary | ICD-10-CM

## 2014-01-03 DIAGNOSIS — D509 Iron deficiency anemia, unspecified: Secondary | ICD-10-CM

## 2014-01-03 LAB — IRON AND TIBC CHCC
%SAT: 20 % — ABNORMAL LOW (ref 21–57)
Iron: 76 ug/dL (ref 41–142)
TIBC: 371 ug/dL (ref 236–444)
UIBC: 295 ug/dL (ref 120–384)

## 2014-01-03 LAB — CBC WITH DIFFERENTIAL (CANCER CENTER ONLY)
BASO#: 0 10*3/uL (ref 0.0–0.2)
BASO%: 0.3 % (ref 0.0–2.0)
EOS%: 2.8 % (ref 0.0–7.0)
Eosinophils Absolute: 0.2 10*3/uL (ref 0.0–0.5)
HEMATOCRIT: 36.9 % (ref 34.8–46.6)
HEMOGLOBIN: 12 g/dL (ref 11.6–15.9)
LYMPH#: 0.9 10*3/uL (ref 0.9–3.3)
LYMPH%: 14.8 % (ref 14.0–48.0)
MCH: 28.9 pg (ref 26.0–34.0)
MCHC: 32.5 g/dL (ref 32.0–36.0)
MCV: 89 fL (ref 81–101)
MONO#: 0.5 10*3/uL (ref 0.1–0.9)
MONO%: 8.4 % (ref 0.0–13.0)
NEUT#: 4.5 10*3/uL (ref 1.5–6.5)
NEUT%: 73.7 % (ref 39.6–80.0)
Platelets: 234 10*3/uL (ref 145–400)
RBC: 4.15 10*6/uL (ref 3.70–5.32)
RDW: 24 % — AB (ref 11.1–15.7)
WBC: 6.1 10*3/uL (ref 3.9–10.0)

## 2014-01-03 LAB — FERRITIN CHCC: Ferritin: 117 ng/ml (ref 9–269)

## 2014-01-04 NOTE — Progress Notes (Signed)
Hematology and Oncology Follow Up Visit  Tracy Miles [id number] 08-09-1934 80 y.o. 01/04/2014   Principle Diagnosis:   Iron deficiency anemia  Current Therapy:    IV iron-1 dose given back in April 2015     Interim History:  Ms.  Miles is back for followup. We first saw back in late March. We found that she was iron deficient. At that point we found that her ferritin was 32 but her iron saturation is only 7%.  We gave her IV iron with Feraheme. This helped her quite a bit. She has felt better. She's not noted any obvious bleeding. It was felt that she had some GI blood loss.  She has more energy. She has a better appetite.  We also checked her hemoglobin electrophoresis for first saw her. This was normal.  She's had no nausea vomiting. There's been no obvious change in bowel or bladder habits. She's had no rashes.. She continues on anticoagulation for atrial fibrillation.  She does have a lot of hip problems. She probably will need to have hip surgery at some point. Her right hip is below her. Medications: Current outpatient prescriptions:Acetaminophen (TYLENOL EX ST ARTHRITIS PAIN PO), Take by mouth 2 (two) times daily. 2 tabs in AM and 2 in PM, Disp: , Rfl: ;  amiodarone (PACERONE) 100 MG tablet, Take 1 tablet (100 mg total) by mouth daily., Disp: 90 tablet, Rfl: 1;  apixaban (ELIQUIS) 5 MG TABS tablet, Take 1 tablet (5 mg total) by mouth 2 (two) times daily., Disp: 180 tablet, Rfl: 1 atorvastatin (LIPITOR) 20 MG tablet, Take 1 tablet (20 mg total) by mouth daily., Disp: 90 tablet, Rfl: 1;  bumetanide (BUMEX) 1 MG tablet, Take 1 tablet (1 mg total) by mouth daily., Disp: 90 tablet, Rfl: 1;  calcium carbonate (OS-CAL) 600 MG TABS tablet, Take 600 mg by mouth 2 (two) times daily with a meal., Disp: , Rfl: ;  lisinopril (PRINIVIL,ZESTRIL) 10 MG tablet, Take 1 tablet (10 mg total) by mouth daily., Disp: 90 tablet, Rfl: 1 loratadine (CLARITIN) 10 MG tablet, Take 10 mg by mouth daily as  needed. , Disp: , Rfl: ;  potassium chloride (KLOR-CON M10) 10 MEQ tablet, Take 1 tablet (10 mEq total) by mouth daily., Disp: 90 tablet, Rfl: 1;  psyllium (REGULOID) 0.52 G capsule, Take 0.52 g by mouth 2 (two) times daily., Disp: , Rfl: ;  verapamil (COVERA HS) 180 MG (CO) 24 hr tablet, Take 1 tablet (180 mg total) by mouth at bedtime., Disp: 90 tablet, Rfl: 1  Allergies:  Allergies  Allergen Reactions  . Sulfa Antibiotics   . Zebeta [Bisoprolol Fumarate]   . Penicillins Rash    Past Medical History, Surgical history, Social history, and Family History were reviewed and updated.  Review of Systems: As above  Physical Exam:  height is 5\' 3"  (1.6 m) and weight is 257 lb (116.574 kg). Her oral temperature is 98.2 F (36.8 C). Her blood pressure is 142/53 and her pulse is 66. Her respiration is 16.   Well-developed and well-nourished white female. Lungs are clear. Cardiac exam regular rate and rhythm with no murmurs rubs or bruits. Abdomen is soft. Has good bowel sounds. There is no fluid wave. There is no palpable liver or spleen tip. Back exam no tenderness over the spine ribs or hips. Extremities shows no clubbing cyanosis or edema. Skin exam no rashes. Neurological exam no focal neurological deficits.  Lab Results  Component Value Date   WBC 6.1 01/03/2014  HGB 12.0 01/03/2014   HCT 36.9 01/03/2014   MCV 89 01/03/2014   PLT 234 01/03/2014     Chemistry      Component Value Date/Time   NA 141 11/16/2013 0852   K 4.7 11/16/2013 0852   CL 104 11/16/2013 0852   CO2 29 11/16/2013 0852   BUN 18 11/16/2013 0852   CREATININE 1.02 11/16/2013 0852   CREATININE 1.1 11/06/2013 1219      Component Value Date/Time   CALCIUM 9.3 11/16/2013 0852   ALKPHOS 82 11/16/2013 0852   AST 21 11/16/2013 0852   ALT 21 11/16/2013 0852   BILITOT 0.6 11/16/2013 0852     Her ferritin is 117. Iron saturation is 20%. Total iron is 76.    Impression and Plan: Tracy Miles is 80 year old white female. [name]. She responded very nice a to IV iron. Her hemoglobin came up incredibly well.  I think that we could give her one more dose of Feraheme. At that point, her iron stores should be replaced. I then don't think we would have to see her back. At that we could then probably just did are back if she feels she needs to have blood work done.  Again, she responded nicely to the iron. Her hemoglobin came up almost 3-1/2 points.  On the stable that she feels better.  She needs hip surgery, I told her to let us know so we can make sure that her blood count and iron stores are okay as there'll probably would be bleeding with the surgery.Marland Kitchen    Volanda Napoleon, MD 5/7/20156:51 AM

## 2014-01-05 ENCOUNTER — Other Ambulatory Visit: Payer: Self-pay | Admitting: *Deleted

## 2014-01-05 ENCOUNTER — Telehealth: Payer: Self-pay | Admitting: Hematology & Oncology

## 2014-01-05 ENCOUNTER — Telehealth: Payer: Self-pay | Admitting: *Deleted

## 2014-01-05 DIAGNOSIS — D649 Anemia, unspecified: Secondary | ICD-10-CM

## 2014-01-05 LAB — ERYTHROPOIETIN: Erythropoietin: 19.7 m[IU]/mL — ABNORMAL HIGH (ref 2.6–18.5)

## 2014-01-05 LAB — RETICULOCYTES (CHCC)
ABS Retic: 50.5 10*3/uL (ref 19.0–186.0)
RBC.: 4.21 MIL/uL (ref 3.87–5.11)
Retic Ct Pct: 1.2 % (ref 0.4–2.3)

## 2014-01-05 LAB — VITAMIN B12: Vitamin B-12: 296 pg/mL (ref 211–911)

## 2014-01-05 NOTE — Telephone Encounter (Signed)
Message copied by Rico Ala on Fri Jan 05, 2014  1:01 PM ------      Message from: Volanda Napoleon      Created: Fri Jan 05, 2014  6:45 AM       Call - iron is better but still a little low.  One more dose of IV iron will keep your blood up for quite a while!! Please set up Feraheme 1020mg  x 1 dose in 1-2 weeks.  Thanks!!  Laurey Arrow ------

## 2014-01-05 NOTE — Telephone Encounter (Signed)
Called patient to let her know that her iron was better but still a little low.  Needs one more dose of IV iron in next few weeks.  Patient out of town and does not have her appointment book.  Will call us when she gets back in town to schedule.  Told her i would let scheduler know

## 2014-01-05 NOTE — Telephone Encounter (Signed)
Per orders to sch patient for iron appt.  Patient called and sch apt for 01/15/14

## 2014-01-15 ENCOUNTER — Ambulatory Visit (HOSPITAL_BASED_OUTPATIENT_CLINIC_OR_DEPARTMENT_OTHER): Payer: Medicare Other

## 2014-01-15 VITALS — BP 150/80 | HR 70 | Temp 98.0°F | Resp 16

## 2014-01-15 DIAGNOSIS — D649 Anemia, unspecified: Secondary | ICD-10-CM

## 2014-01-15 DIAGNOSIS — D509 Iron deficiency anemia, unspecified: Secondary | ICD-10-CM

## 2014-01-15 MED ORDER — SODIUM CHLORIDE 0.9 % IJ SOLN
10.0000 mL | INTRAMUSCULAR | Status: DC | PRN
  Filled 2014-01-15: qty 10

## 2014-01-15 MED ORDER — SODIUM CHLORIDE 0.9 % IV SOLN
Freq: Once | INTRAVENOUS | Status: AC
  Administered 2014-01-15: 12:00:00 via INTRAVENOUS

## 2014-01-15 MED ORDER — FAMOTIDINE IN NACL 20-0.9 MG/50ML-% IV SOLN
20.0000 mg | Freq: Two times a day (BID) | INTRAVENOUS | Status: DC
  Administered 2014-01-15: 20 mg via INTRAVENOUS

## 2014-01-15 MED ORDER — ALTEPLASE 2 MG IJ SOLR
2.0000 mg | Freq: Once | INTRAMUSCULAR | Status: DC | PRN
  Filled 2014-01-15: qty 2

## 2014-01-15 MED ORDER — SODIUM CHLORIDE 0.9 % IJ SOLN
3.0000 mL | Freq: Once | INTRAMUSCULAR | Status: DC | PRN
  Filled 2014-01-15: qty 10

## 2014-01-15 MED ORDER — FAMOTIDINE IN NACL 20-0.9 MG/50ML-% IV SOLN
INTRAVENOUS | Status: AC
  Filled 2014-01-15: qty 50

## 2014-01-15 MED ORDER — HEPARIN SOD (PORK) LOCK FLUSH 100 UNIT/ML IV SOLN
250.0000 [IU] | Freq: Once | INTRAVENOUS | Status: DC | PRN
  Filled 2014-01-15: qty 5

## 2014-01-15 MED ORDER — HEPARIN SOD (PORK) LOCK FLUSH 100 UNIT/ML IV SOLN
500.0000 [IU] | Freq: Once | INTRAVENOUS | Status: DC | PRN
  Filled 2014-01-15: qty 5

## 2014-01-15 MED ORDER — METHYLPREDNISOLONE SODIUM SUCC 125 MG IJ SOLR
INTRAMUSCULAR | Status: AC
  Filled 2014-01-15: qty 2

## 2014-01-15 MED ORDER — SODIUM CHLORIDE 0.9 % IV SOLN
1020.0000 mg | Freq: Once | INTRAVENOUS | Status: AC
  Administered 2014-01-15: 1020 mg via INTRAVENOUS
  Filled 2014-01-15: qty 34

## 2014-01-15 MED ORDER — METHYLPREDNISOLONE SODIUM SUCC 125 MG IJ SOLR
80.0000 mg | Freq: Once | INTRAMUSCULAR | Status: AC
  Administered 2014-01-15: 80 mg via INTRAVENOUS

## 2014-01-15 NOTE — Patient Instructions (Signed)

## 2014-01-17 ENCOUNTER — Encounter (HOSPITAL_BASED_OUTPATIENT_CLINIC_OR_DEPARTMENT_OTHER): Payer: Self-pay | Admitting: Emergency Medicine

## 2014-01-17 ENCOUNTER — Emergency Department (HOSPITAL_BASED_OUTPATIENT_CLINIC_OR_DEPARTMENT_OTHER): Payer: Medicare Other

## 2014-01-17 ENCOUNTER — Emergency Department (HOSPITAL_BASED_OUTPATIENT_CLINIC_OR_DEPARTMENT_OTHER)
Admission: EM | Admit: 2014-01-17 | Discharge: 2014-01-17 | Disposition: A | Discharge status: Home / Self Care | Payer: Medicare Other | Attending: Emergency Medicine | Admitting: Emergency Medicine

## 2014-01-17 DIAGNOSIS — I2510 Atherosclerotic heart disease of native coronary artery without angina pectoris: Secondary | ICD-10-CM | POA: Insufficient documentation

## 2014-01-17 DIAGNOSIS — E7800 Pure hypercholesterolemia, unspecified: Secondary | ICD-10-CM | POA: Insufficient documentation

## 2014-01-17 DIAGNOSIS — G8929 Other chronic pain: Secondary | ICD-10-CM | POA: Insufficient documentation

## 2014-01-17 DIAGNOSIS — G4733 Obstructive sleep apnea (adult) (pediatric): Secondary | ICD-10-CM | POA: Insufficient documentation

## 2014-01-17 DIAGNOSIS — Y9289 Other specified places as the place of occurrence of the external cause: Secondary | ICD-10-CM | POA: Insufficient documentation

## 2014-01-17 DIAGNOSIS — W010XXA Fall on same level from slipping, tripping and stumbling without subsequent striking against object, initial encounter: Secondary | ICD-10-CM | POA: Insufficient documentation

## 2014-01-17 DIAGNOSIS — E785 Hyperlipidemia, unspecified: Secondary | ICD-10-CM | POA: Insufficient documentation

## 2014-01-17 DIAGNOSIS — Z8672 Personal history of thrombophlebitis: Secondary | ICD-10-CM | POA: Insufficient documentation

## 2014-01-17 DIAGNOSIS — E669 Obesity, unspecified: Secondary | ICD-10-CM | POA: Insufficient documentation

## 2014-01-17 DIAGNOSIS — IMO0001 Reserved for inherently not codable concepts without codable children: Secondary | ICD-10-CM

## 2014-01-17 DIAGNOSIS — Z79899 Other long term (current) drug therapy: Secondary | ICD-10-CM | POA: Insufficient documentation

## 2014-01-17 DIAGNOSIS — I10 Essential (primary) hypertension: Secondary | ICD-10-CM | POA: Insufficient documentation

## 2014-01-17 DIAGNOSIS — I4891 Unspecified atrial fibrillation: Secondary | ICD-10-CM | POA: Insufficient documentation

## 2014-01-17 DIAGNOSIS — Z8719 Personal history of other diseases of the digestive system: Secondary | ICD-10-CM | POA: Insufficient documentation

## 2014-01-17 DIAGNOSIS — R011 Cardiac murmur, unspecified: Secondary | ICD-10-CM | POA: Insufficient documentation

## 2014-01-17 DIAGNOSIS — Z8619 Personal history of other infectious and parasitic diseases: Secondary | ICD-10-CM | POA: Insufficient documentation

## 2014-01-17 DIAGNOSIS — Z862 Personal history of diseases of the blood and blood-forming organs and certain disorders involving the immune mechanism: Secondary | ICD-10-CM | POA: Insufficient documentation

## 2014-01-17 DIAGNOSIS — S61209A Unspecified open wound of unspecified finger without damage to nail, initial encounter: Secondary | ICD-10-CM | POA: Insufficient documentation

## 2014-01-17 DIAGNOSIS — S63279A Dislocation of unspecified interphalangeal joint of unspecified finger, initial encounter: Secondary | ICD-10-CM | POA: Insufficient documentation

## 2014-01-17 DIAGNOSIS — Z860100 Personal history of colon polyps, unspecified: Secondary | ICD-10-CM | POA: Insufficient documentation

## 2014-01-17 DIAGNOSIS — Z880 Allergy status to penicillin: Secondary | ICD-10-CM | POA: Insufficient documentation

## 2014-01-17 DIAGNOSIS — Z86718 Personal history of other venous thrombosis and embolism: Secondary | ICD-10-CM | POA: Insufficient documentation

## 2014-01-17 DIAGNOSIS — S63259A Unspecified dislocation of unspecified finger, initial encounter: Principal | ICD-10-CM | POA: Insufficient documentation

## 2014-01-17 DIAGNOSIS — IMO0002 Reserved for concepts with insufficient information to code with codable children: Secondary | ICD-10-CM | POA: Insufficient documentation

## 2014-01-17 DIAGNOSIS — Y9301 Activity, walking, marching and hiking: Secondary | ICD-10-CM | POA: Insufficient documentation

## 2014-01-17 DIAGNOSIS — Z7902 Long term (current) use of antithrombotics/antiplatelets: Secondary | ICD-10-CM | POA: Insufficient documentation

## 2014-01-17 DIAGNOSIS — M1990 Unspecified osteoarthritis, unspecified site: Secondary | ICD-10-CM | POA: Insufficient documentation

## 2014-01-17 DIAGNOSIS — S61218A Laceration without foreign body of other finger without damage to nail, initial encounter: Secondary | ICD-10-CM

## 2014-01-17 MED ORDER — LIDOCAINE HCL (PF) 1 % IJ SOLN
10.0000 mL | Freq: Once | INTRAMUSCULAR | Status: DC
Start: 2014-01-17 — End: 2014-01-17

## 2014-01-17 MED ORDER — CEPHALEXIN 250 MG PO CAPS
ORAL_CAPSULE | ORAL | Status: DC
Start: 2014-01-17 — End: 2014-01-18
  Filled 2014-01-17: qty 1

## 2014-01-17 MED ORDER — CEPHALEXIN 500 MG PO CAPS
500.0000 mg | ORAL_CAPSULE | Freq: Three times a day (TID) | ORAL | Status: DC
Start: 2014-01-17 — End: 2014-02-23

## 2014-01-17 MED ORDER — CEPHALEXIN 250 MG PO CAPS
500.0000 mg | ORAL_CAPSULE | Freq: Once | ORAL | Status: AC
  Administered 2014-01-17: 500 mg via ORAL
  Filled 2014-01-17: qty 2

## 2014-01-17 NOTE — Discharge Instructions (Signed)
Finger Dislocation Finger dislocation is the displacement of bones in your finger at the joints. Most commonly, finger dislocation occurs at the proximal interphalangeal joint (the joint closest to your knuckle). Very strong, fibrous tissues (ligaments) and joint capsules connect the three bones of your fingers.  CAUSES Dislocation is caused by a forceful impact. This impact moves these bones off the joint and often tears your ligaments.  SYMPTOMS Symptoms of finger dislocation include:  Deformity of your finger.  Pain, with loss of movement. DIAGNOSIS  Finger dislocation is diagnosed with a physical exam. Often, X-ray exams are done to see if you have associated injuries, such as bone fractures. TREATMENT  Finger dislocations are treated by putting your bones back into position (reduction) either by manually moving the bones back into place or through surgery. Your finger is then kept in a fixed position (immobilized) with the use of a dressing or splint for a brief period. When your ligament has to be surgically repaired, it needs to be kept in a fixed position with a dressing or splint for 1 to 2 weeks. Because joint stiffness is a long-term complication of finger dislocation, hand exercises or physical therapy to increase the range of motion and to regain strength is usually started as soon as the ligament is healed. Exercises and therapy generally last no more than 3 months. HOME CARE INSTRUCTIONS The following measures can help to reduce pain and speed up the healing process:  Rest your injured joint. Do not move until instructed otherwise by your caregiver. Avoid activities similar to the one that caused your injury.  Apply ice to your injured joint for the first day or 2 after your reduction or as directed by your caregiver. Applying ice helps to reduce inflammation and pain.  Put ice in a plastic bag.  Place a towel between your skin and the bag.  Leave the ice on for 15-20 minutes  at a time, every 2 hours while you are awake.  Elevate your hand above your heart as directed by your caregiver to reduce swelling.  Take over-the-counter or prescription medicine for pain as your caregiver instructs you. SEEK IMMEDIATE MEDICAL CARE IF:  Your dressing or splint becomes damaged.  Your pain becomes worse rather than better.  You lose feeling in your finger, or it becomes cold and white. MAKE SURE YOU:  Understand these instructions.  Will watch your condition.  Will get help right away if you are not doing well or get worse. Document Released: 08/14/2000 Document Revised: 11/09/2011 Document Reviewed: 06/07/2011 [facility] Patient Information 2014 [place].

## 2014-01-17 NOTE — ED Provider Notes (Signed)
CSN: [id number]     Arrival date & time 01/17/14  1903 History  This chart was scribed for Houston Siren III,  by Allena Earing, ED Scribe. This patient was seen in room MH06/MH06 and the patient's care was started at 8:38 PM .     Chief Complaint  Patient presents with  . Hand Injury      Patient is a 80 y.o. female presenting with hand injury. The history is provided by the patient. No language interpreter was used.  Hand Injury Location:  Hand and finger Time since incident:  3 hours Injury: yes   Mechanism of injury: fall   Fall:    Fall occurred:  Tripped and walking   Height of fall:  From standing position   Impact surface:  Unable to specify   Point of impact:  Hands   Entrapped after fall: no   Hand location:  L hand Finger location:  L index finger and L ring finger Pain details:    Quality:  Unable to specify   Severity:  Moderate   Onset quality:  Sudden   Duration:  3 hours   Timing:  Rare   Progression:  Unchanged Chronicity:  New Tetanus status:  Up to date Relieved by:  Being still Worsened by:  Movement Associated symptoms: swelling   Associated symptoms: no numbness     HPI Comments: Tracy Miles is a 80 y.o. female who presents to the Emergency Department complaining of a left hand injury that began after she fell this evening from a standing position, pt was at [facility] around 5:50 PM. Pt states that the pain is currently a 8/10. She reports that  "I did not lift my feet when I was stepping over a curb and I fell and caught myself with my hands". Pt reports a laceration to her left index and ring finger. Pt reports chronic hip pain that she did not aggravate when she fell.   Orthopedist [place] Last tetanus shot in December 2013.   Pt is on blood thinning medication and protected her head when she fell.   Past Medical History  Diagnosis Date  . Hypertension   . Heart murmur   . Coronary artery disease   .  Atrial fibrillation   . Anemia   . Arthritis   . Colon polyp   . Personal history of DVT (deep vein thrombosis)     x 2  . Hyperlipidemia   . Obesity   . Pancreatitis     post hysterectomy  . Sleep apnea   . Aortic stenosis     Very mild  . Hypercholesteremia   . OSA on CPAP   . PAF (paroxysmal atrial fibrillation)   . History of deep vein thrombophlebitis of lower extremity   . History of valvular heart disease     LEFT  . Chicken pox as a child  . Measles as a child  . Mumps child and teenager  . Allergy   . Obesity   . Unspecified constipation 11/16/2013  . Heme positive stool 11/16/2013  . DDD (degenerative disc disease) 11/16/2013  . History of DVT (deep vein thrombosis) 11/19/2013  . Unspecified sleep apnea 11/19/2013  . Allergic state 11/19/2013  . OA (osteoarthritis) 11/19/2013    S/p L TKR  . Benign paroxysmal positional vertigo 12/17/2013  . Abnormal results of thyroid function studies 12/17/2013   Past Surgical History  Procedure Laterality Date  . Tonsilectomy, adenoidectomy, bilateral myringotomy and  tubes  child  . Laminectomy  42 yrs ago  . Tubal ligation  80 years old  . Total abdominal hysterectomy  1995    had 2 tumors- benign  . Replacement total knee Left 2013  . Cyst removal hand Bilateral 20 yrs ago  . Meniscus repair Bilateral 2000 and 2010  . Appendectomy     Family History  Problem Relation Age of Onset  . Heart attack Mother 12  . Hyperlipidemia Mother     ?  Marland Kitchen Dementia Mother   . Pernicious anemia Mother   . Heart disease Mother 18    MI  . COPD Father     smoker  . Cancer Father 83    prostate  . Heart disease Brother     quadruple bipass surgery  . Hyperlipidemia Brother   . Hypertension Brother   . Diabetes Maternal Grandmother     type 2  . Pernicious anemia Maternal Grandmother   . Colon cancer Neg Hx   . Pancreatic cancer Neg Hx   . Stomach cancer Neg Hx   . Throat cancer Neg Hx   . Liver disease Neg Hx   . Gout Son   .  Atrial fibrillation Son   . Hyperlipidemia Son   . Cancer Maternal Grandfather     liver  . Cancer Paternal Grandmother     lung- doesn't think she smokes?  . Stroke Paternal Grandfather   . Cancer Son 50    non hodgin's lymphoma  . Gout Son   . Hyperlipidemia Son   . Sleep apnea Son    History  Substance Use Topics  . Smoking status: Never Smoker   . Smokeless tobacco: Never Used     Comment: never used tobacco  . Alcohol Use: No   OB History   Grav Para Term Preterm Abortions TAB SAB Ect Mult Living                 Review of Systems  Musculoskeletal: Positive for arthralgias and myalgias.  Skin: Positive for wound.  Neurological: Negative for syncope.  All other systems reviewed and are negative.     Allergies  Sulfa antibiotics; Zebeta; and Penicillins  Home Medications   Prior to Admission medications   Medication Sig Start Date End Date Taking? Authorizing Provider  Acetaminophen (TYLENOL EX ST ARTHRITIS PAIN PO) Take by mouth 2 (two) times daily. 2 tabs in AM and 2 in PM    Historical Provider, MD  amiodarone (PACERONE) 100 MG tablet Take 1 tablet (100 mg total) by mouth daily. 12/22/13   [name], MD  apixaban (ELIQUIS) 5 MG TABS tablet Take 1 tablet (5 mg total) by mouth 2 (two) times daily. 12/22/13   [name], MD  atorvastatin (LIPITOR) 20 MG tablet Take 1 tablet (20 mg total) by mouth daily. 12/14/13   [name], MD  bumetanide (BUMEX) 1 MG tablet Take 1 tablet (1 mg total) by mouth daily. 12/14/13   [name], MD  calcium carbonate (OS-CAL) 600 MG TABS tablet Take 600 mg by mouth 2 (two) times daily with a meal.    Historical Provider, MD  fentaNYL 10 mcg/mL injection Inject 100 mcg into the vein every 4 (four) hours. Given enroute by EMS    Historical Provider, MD  lisinopril (PRINIVIL,ZESTRIL) 10 MG tablet Take 1 tablet (10 mg total) by mouth daily. 12/14/13   [name], MD  loratadine (CLARITIN) 10 MG tablet Take 10 mg by  mouth daily  as needed.     Historical Provider, MD  potassium chloride (KLOR-CON M10) 10 MEQ tablet Take 1 tablet (10 mEq total) by mouth daily. 12/14/13   [name], MD  psyllium (REGULOID) 0.52 G capsule Take 0.52 g by mouth 2 (two) times daily.    Historical Provider, MD  verapamil (COVERA HS) 180 MG (CO) 24 hr tablet Take 1 tablet (180 mg total) by mouth at bedtime. 12/14/13   [name], MD   BP 154/70  Pulse 78  Temp(Src) 99.5 F (37.5 C) (Oral)  Resp 18  Ht 5\' 3"  (1.6 m)  Wt 251 lb (113.853 kg)  BMI 44.47 kg/m2  SpO2 96% Physical Exam  Nursing note and vitals reviewed. Constitutional: She is oriented to person, place, and time. She appears well-developed and well-nourished. No distress.  HENT:  Head: Normocephalic and atraumatic.  Eyes: Conjunctivae are normal. No scleral icterus.  Neck: Neck supple.  Cardiovascular: Normal rate and intact distal pulses.   Pulmonary/Chest: Effort normal. No stridor. No respiratory distress.  Abdominal: Normal appearance. She exhibits no distension.  Musculoskeletal:       Left hand: She exhibits decreased range of motion, tenderness, deformity (1st and 2nd fingers at PIP joints) and laceration (first finger at palmar side of PIP joint). She exhibits normal capillary refill. Normal sensation noted. Normal strength noted.  Neurological: She is alert and oriented to person, place, and time.  Skin: Skin is warm and dry. No rash noted.  Psychiatric: She has a normal mood and affect. Her behavior is normal.    ED Course  ORTHOPEDIC INJURY TREATMENT Date/Time: 01/17/2014 11:04 PM Performed by: Serita Grit DAVID III Authorized by: Serita Grit DAVID III Consent: Verbal consent obtained. Risks and benefits: risks, benefits and alternatives were discussed Consent given by: patient Injury location: finger Location details: left index finger Injury type: dislocation Dislocation type: PIP Pre-procedure neurovascular assessment: neurovascularly  intact Pre-procedure distal perfusion: normal Pre-procedure neurological function: normal Pre-procedure range of motion: reduced Local anesthesia used: yes Anesthesia: digital block Local anesthetic: lidocaine 2% without epinephrine Anesthetic total: 3 ml Manipulation performed: yes Reduction successful: yes X-ray confirmed reduction: yes Post-procedure neurovascular assessment: post-procedure neurovascularly intact Post-procedure distal perfusion: normal Post-procedure neurological function: normal Post-procedure range of motion: normal Patient tolerance: Patient tolerated the procedure well with no immediate complications.  ORTHOPEDIC INJURY TREATMENT Date/Time: 01/17/2014 11:06 PM Performed by: Serita Grit DAVID III Authorized by: Serita Grit DAVID III Consent: Verbal consent obtained. Risks and benefits: risks, benefits and alternatives were discussed Consent given by: patient Injury location: finger Location details: left long finger Injury type: dislocation Dislocation type: PIP Pre-procedure neurovascular assessment: neurovascularly intact Pre-procedure distal perfusion: normal Pre-procedure neurological function: normal Pre-procedure range of motion: reduced Local anesthesia used: yes Anesthesia: digital block Local anesthetic: lidocaine 2% without epinephrine Anesthetic total: 3 ml Manipulation performed: yes Reduction successful: yes X-ray confirmed reduction: yes Post-procedure neurovascular assessment: post-procedure neurovascularly intact Post-procedure distal perfusion: normal Post-procedure neurological function: normal Post-procedure range of motion: normal Patient tolerance: Patient tolerated the procedure well with no immediate complications.  LACERATION REPAIR Date/Time: 01/17/2014 11:07 PM Performed by: Serita Grit DAVID III Authorized by: Serita Grit DAVID III Consent: Verbal consent obtained. Risks and benefits: risks, benefits and  alternatives were discussed Consent given by: patient Body area: upper extremity Location details: left index finger Laceration length: 2 cm Foreign bodies: no foreign bodies Tendon involvement: none (visualized and intact) Nerve involvement: none Vascular damage: no Preparation: Patient was prepped and  draped in the usual sterile fashion. Irrigation solution: saline Irrigation method: jet lavage Amount of cleaning: extensive Debridement: none Degree of undermining: none Skin closure: 4-0 Prolene Number of sutures: 4 Technique: simple Approximation: close Approximation difficulty: simple Dressing: antibiotic ointment Patient tolerance: Patient tolerated the procedure well with no immediate complications.   (including critical care time)  DIAGNOSTIC STUDIES: Oxygen Saturation is 96% on RA, normal by my interpretation.    COORDINATION OF CARE:   8:48 PM-Discussed treatment plan which includes resetting her fingers and providing a splint with pt at bedside and pt agreed to plan.   Labs Review Labs Reviewed - No data to display  Imaging Review Dg Hand Complete Left  01/17/2014   CLINICAL DATA:  Status post reduction of dislocation.  EXAM: LEFT HAND - COMPLETE 3+ VIEW  COMPARISON:  Plain films of the left hand earlier this same day.  FINDINGS: The PIP joint of the left long finger has been reduced. Chip fracture off the volar plate of the base of the middle phalanx is noted. No other change is identified.  IMPRESSION: Successful reduction of dislocation of the PIP joint of the left long finger. Chip fracture off the volar plate of the base of the middle phalanx noted.   Electronically Signed   By: Inge Rise M.D.   On: 01/17/2014 21:40   Dg Hand Complete Left  01/17/2014   CLINICAL DATA:  And injury. Fall with laceration to the index and ring finger.  EXAM: LEFT HAND - COMPLETE 3+ VIEW  COMPARISON:  None.  FINDINGS: There is dorsal dislocation of the PIP joints of the index  and long fingers. A fracture of the head of the long finger proximal phalanx is questioned on the lateral image. Bones appear mildly osteopenic diffusely. Mild first CMC osteoarthrosis is noted.  IMPRESSION: 1. Dorsal dislocation of the index and long finger PIP joints. 2. Question nondisplaced fracture of the long finger proximal phalanx.   Electronically Signed   By: Logan Bores   On: 01/17/2014 19:38  All radiology studies independently viewed by me.      EKG Interpretation None      MDM   Final diagnoses:  Open dislocation of finger of left hand  Dislocation of third finger, left, closed  Laceration of index finger    80 year old female who fell onto her left hand which cause an open deformity. Plain films confirm dislocation of index and long fingers. Digital block performed, dislocations reduced, laceration repaired. Tetanus is up-to-date. Patient denied other injuries, denies head trauma, denied loss of consciousness, denies neck pain. She will followup with hand surgery this week. Given Keflex for prophylaxis. She declined pain medication in the ED. Of note, x-ray report of post reduction film does not specifically comment on her long finger PIP joint. On my read, this joint this successfully reduced. She was told about possible fractures. Splinted prior to discharge.  I personally performed the services described in this documentation, which was scribed in my presence. The recorded information has been reviewed and is accurate.    Houston Siren III, MD 01/17/14 [phone]

## 2014-01-17 NOTE — ED Notes (Signed)
Brought in by EMS from [place] with fall from standing NO LOC , left hand injury with laceration to index and ring finger

## 2014-02-23 ENCOUNTER — Other Ambulatory Visit: Payer: Self-pay | Admitting: Family Medicine

## 2014-02-23 ENCOUNTER — Ambulatory Visit (INDEPENDENT_AMBULATORY_CARE_PROVIDER_SITE_OTHER): Payer: Medicare Other | Admitting: Family Medicine

## 2014-02-23 ENCOUNTER — Encounter: Payer: Self-pay | Admitting: Family Medicine

## 2014-02-23 VITALS — HR 70 | Temp 98.7°F | Ht 63.0 in | Wt 260.0 lb

## 2014-02-23 DIAGNOSIS — Z1231 Encounter for screening mammogram for malignant neoplasm of breast: Secondary | ICD-10-CM

## 2014-02-23 DIAGNOSIS — K5900 Constipation, unspecified: Secondary | ICD-10-CM

## 2014-02-23 DIAGNOSIS — M5137 Other intervertebral disc degeneration, lumbosacral region: Secondary | ICD-10-CM

## 2014-02-23 DIAGNOSIS — I10 Essential (primary) hypertension: Secondary | ICD-10-CM

## 2014-02-23 DIAGNOSIS — M5136 Other intervertebral disc degeneration, lumbar region: Secondary | ICD-10-CM

## 2014-02-23 DIAGNOSIS — H547 Unspecified visual loss: Secondary | ICD-10-CM

## 2014-02-23 DIAGNOSIS — Z86010 Personal history of colonic polyps: Secondary | ICD-10-CM

## 2014-02-23 DIAGNOSIS — G4730 Sleep apnea, unspecified: Secondary | ICD-10-CM

## 2014-02-23 DIAGNOSIS — D509 Iron deficiency anemia, unspecified: Secondary | ICD-10-CM

## 2014-02-23 NOTE — Patient Instructions (Addendum)
DASH Eating Plan  DASH stands for "Dietary Approaches to Stop Hypertension." The DASH eating plan is a healthy eating plan that has been shown to reduce high blood pressure (hypertension). Additional health benefits may include reducing the risk of type 2 diabetes mellitus, heart disease, and stroke. The DASH eating plan may also help with weight loss.  WHAT DO I NEED TO KNOW ABOUT THE DASH EATING PLAN?  For the DASH eating plan, you will follow these general guidelines:  · Choose foods with a percent daily value for sodium of less than 5% (as listed on the food label).  · Use salt-free seasonings or herbs instead of table salt or sea salt.  · Check with your health care provider or pharmacist before using salt substitutes.  · Eat lower-sodium products, often labeled as "lower sodium" or "no salt added."  · Eat fresh foods.  · Eat more vegetables, fruits, and low-fat dairy products.  · Choose whole grains. Look for the word "whole" as the first word in the ingredient list.  · Choose fish and skinless chicken or turkey more often than red meat. Limit fish, poultry, and meat to 6 oz (170 g) each day.  · Limit sweets, desserts, sugars, and sugary drinks.  · Choose heart-healthy fats.  · Limit cheese to 1 oz (28 g) per day.  · Eat more home-cooked food and less restaurant, buffet, and fast food.  · Limit fried foods.  · Cook foods using methods other than frying.  · Limit canned vegetables. If you do use them, rinse them well to decrease the sodium.  · When eating at a restaurant, ask that your food be prepared with less salt, or no salt if possible.  WHAT FOODS CAN I EAT?  Seek help from a dietitian for individual calorie needs.  Grains  Whole grain or whole wheat bread. Brown rice. Whole grain or whole wheat pasta. Quinoa, bulgur, and whole grain cereals. Low-sodium cereals. Corn or whole wheat flour tortillas. Whole grain cornbread. Whole grain crackers. Low-sodium crackers.  Vegetables  Fresh or frozen vegetables  (raw, steamed, roasted, or grilled). Low-sodium or reduced-sodium tomato and vegetable juices. Low-sodium or reduced-sodium tomato sauce and paste. Low-sodium or reduced-sodium canned vegetables.   Fruits  All fresh, canned (in natural juice), or frozen fruits.  Meat and Other Protein Products  Ground beef (85% or leaner), grass-fed beef, or beef trimmed of fat. Skinless chicken or turkey. Ground chicken or turkey. Pork trimmed of fat. All fish and seafood. Eggs. Dried beans, peas, or lentils. Unsalted nuts and seeds. Unsalted canned beans.  Dairy  Low-fat dairy products, such as skim or 1% milk, 2% or reduced-fat cheeses, low-fat ricotta or cottage cheese, or plain low-fat yogurt. Low-sodium or reduced-sodium cheeses.  Fats and Oils  Tub margarines without trans fats. Light or reduced-fat mayonnaise and salad dressings (reduced sodium). Avocado. Safflower, olive, or canola oils. Natural peanut or almond butter.  Other  Unsalted popcorn and pretzels.  The items listed above may not be a complete list of recommended foods or beverages. Contact your dietitian for more options.  WHAT FOODS ARE NOT RECOMMENDED?  Grains  White bread. White pasta. White rice. Refined cornbread. Bagels and croissants. Crackers that contain trans fat.  Vegetables  Creamed or fried vegetables. Vegetables in a cheese sauce. Regular canned vegetables. Regular canned tomato sauce and paste. Regular tomato and vegetable juices.  Fruits  Dried fruits. Canned fruit in light or heavy syrup. Fruit juice.  Meat and Other Protein   Products  Fatty cuts of meat. Ribs, chicken wings, bacon, sausage, bologna, salami, chitterlings, fatback, hot dogs, bratwurst, and packaged luncheon meats. Salted nuts and seeds. Canned beans with salt.  Dairy  Whole or 2% milk, cream, half-and-half, and cream cheese. Whole-fat or sweetened yogurt. Full-fat cheeses or blue cheese. Nondairy creamers and whipped toppings. Processed cheese, cheese spreads, or cheese  curds.  Condiments  Onion and garlic salt, seasoned salt, table salt, and sea salt. Canned and packaged gravies. Worcestershire sauce. Tartar sauce. Barbecue sauce. Teriyaki sauce. Soy sauce, including reduced sodium. Steak sauce. Fish sauce. Oyster sauce. Cocktail sauce. Horseradish. Ketchup and mustard. Meat flavorings and tenderizers. Bouillon cubes. Hot sauce. Tabasco sauce. Marinades. Taco seasonings. Relishes.  Fats and Oils  Butter, stick margarine, lard, shortening, ghee, and bacon fat. Coconut, palm kernel, or palm oils. Regular salad dressings.  Other  Pickles and olives. Salted popcorn and pretzels.  The items listed above may not be a complete list of foods and beverages to avoid. Contact your dietitian for more information.  WHERE CAN I FIND MORE INFORMATION?  National Heart, Lung, and [employer] Institute: www.nhlbi.nih.gov/health/health-topics/topics/dash/  Document Released: 08/06/2011 Document Revised: 08/22/2013 Document Reviewed: 06/21/2013  [employer]® Patient Information ©2015 [employer]. This information is not intended to replace advice given to you by your health care provider. Make sure you discuss any questions you have with your health care provider.

## 2014-02-23 NOTE — Progress Notes (Signed)
Pre visit review using our clinic review tool, if applicable. No additional management support is needed unless otherwise documented below in the visit note. 

## 2014-02-23 NOTE — Assessment & Plan Note (Signed)
Well controlled, no changes to meds. Encouraged heart healthy diet such as the DASH diet and exercise as tolerated.  °

## 2014-02-23 NOTE — Assessment & Plan Note (Signed)
Patient reports using Bipap

## 2014-02-25 ENCOUNTER — Encounter: Payer: Self-pay | Admitting: Family Medicine

## 2014-02-25 DIAGNOSIS — Z860100 Personal history of colon polyps, unspecified: Secondary | ICD-10-CM

## 2014-02-25 DIAGNOSIS — H547 Unspecified visual loss: Secondary | ICD-10-CM | POA: Insufficient documentation

## 2014-02-25 HISTORY — DX: Personal history of colonic polyps: Z86.010

## 2014-02-25 HISTORY — DX: Personal history of colon polyps, unspecified: Z86.0100

## 2014-02-25 NOTE — Assessment & Plan Note (Signed)
Increased back pain with radicular symptoms. Is following with ortho. Good response to injections.

## 2014-02-25 NOTE — Assessment & Plan Note (Signed)
Had colonoscopy in March 2015, repeat in 5 years per note

## 2014-02-25 NOTE — Progress Notes (Signed)
Patient ID: Tracy Miles, female   DOB: [birth year], 80 y.o.   MRN: [id number] Tracy Miles [id number] 1933/12/13 02/25/2014      Progress Note-Follow Up  Subjective  Chief Complaint  Chief Complaint  Patient presents with  . Follow-up    10 week     HPI  Patient is a 80 year old female in today for routine medical care. She is here today in followup. She notes she's been struggling with back pain and hip pain. The right hip and right leg have been painful at times. She did undergo some occupational therapy and that was marginally helpful. She is now seeing Dr. Novella Olive at [facility] been helpful. Prior to that he seen Dr. Apolonio Schneiders at [facility] and that had been less helpful. She reports generally feeling well. Her sleep study was last done 10-12 years ago. She's been using the same BiPAP machine with a nasal cannula since. She is noting an improvement in her constipation since her amiodarone was cut. Well controlled, no changes to meds. Encouraged heart healthy diet such as the DASH diet and exercise as tolerated.   Past Medical History  Diagnosis Date  . Hypertension   . Heart murmur   . Coronary artery disease   . Atrial fibrillation   . Anemia   . Arthritis   . Colon polyp   . Personal history of DVT (deep vein thrombosis)     x 2  . Hyperlipidemia   . Obesity   . Pancreatitis     post hysterectomy  . Sleep apnea   . Aortic stenosis     Very mild  . Hypercholesteremia   . OSA on CPAP   . PAF (paroxysmal atrial fibrillation)   . History of deep vein thrombophlebitis of lower extremity   . History of valvular heart disease     LEFT  . Chicken pox as a child  . Measles as a child  . Mumps child and teenager  . Allergy   . Obesity   . Unspecified constipation 11/16/2013  . Heme positive stool 11/16/2013  . DDD (degenerative disc disease) 11/16/2013  . History of DVT (deep vein thrombosis) 11/19/2013  . Unspecified sleep apnea 11/19/2013  . Allergic  state 11/19/2013  . OA (osteoarthritis) 11/19/2013    S/p L TKR  . Benign paroxysmal positional vertigo 12/17/2013  . Abnormal results of thyroid function studies 12/17/2013  . Iron deficiency anemia 11/13/2013  . Personal history of colonic polyps 02/25/2014    Past Surgical History  Procedure Laterality Date  . Tonsilectomy, adenoidectomy, bilateral myringotomy and tubes  child  . Laminectomy  40 yrs ago  . Tubal ligation  80 years old  . Total abdominal hysterectomy  1995    had 2 tumors- benign  . Replacement total knee Left 2013  . Cyst removal hand Bilateral 20 yrs ago  . Meniscus repair Bilateral 2000 and 2010  . Appendectomy      Family History  Problem Relation Age of Onset  . Heart attack Mother 95  . Hyperlipidemia Mother     ?  Marland Kitchen Dementia Mother   . Pernicious anemia Mother   . Heart disease Mother 64    MI  . COPD Father     smoker  . Cancer Father 91    prostate  . Heart disease Brother     quadruple bipass surgery  . Hyperlipidemia Brother   . Hypertension Brother   . Diabetes Maternal Grandmother  type 2  . Pernicious anemia Maternal Grandmother   . Colon cancer Neg Hx   . Pancreatic cancer Neg Hx   . Stomach cancer Neg Hx   . Throat cancer Neg Hx   . Liver disease Neg Hx   . Gout Son   . Atrial fibrillation Son   . Hyperlipidemia Son   . Cancer Maternal Grandfather     liver  . Cancer Paternal Grandmother     lung- doesn't think she smokes?  . Stroke Paternal Grandfather   . Cancer Son 50    non hodgin's lymphoma  . Gout Son   . Hyperlipidemia Son   . Sleep apnea Son     History   Social History  . Marital Status: Widowed    Spouse Name: N/A    Number of Children: N/A  . Years of Education: N/A   Occupational History  . Not on file.   Social History Main Topics  . Smoking status: Never Smoker   . Smokeless tobacco: Never Used     Comment: never used tobacco  . Alcohol Use: No  . Drug Use: No  . Sexual Activity: No      Comment: [place], low sodium diet   Other Topics Concern  . Not on file   Social History Narrative  . No narrative on file    Current Outpatient Prescriptions on File Prior to Visit  Medication Sig Dispense Refill  . Acetaminophen (TYLENOL EX ST ARTHRITIS PAIN PO) Take by mouth 2 (two) times daily. 2 tabs in AM and 2 in PM Has been taking 6 a day      . amiodarone (PACERONE) 100 MG tablet Take 1 tablet (100 mg total) by mouth daily.  90 tablet  1  . apixaban (ELIQUIS) 5 MG TABS tablet Take 1 tablet (5 mg total) by mouth 2 (two) times daily.  180 tablet  1  . atorvastatin (LIPITOR) 20 MG tablet Take 1 tablet (20 mg total) by mouth daily.  90 tablet  1  . bumetanide (BUMEX) 1 MG tablet Take 1 tablet (1 mg total) by mouth daily.  90 tablet  1  . calcium carbonate (OS-CAL) 600 MG TABS tablet Take 600 mg by mouth 2 (two) times daily with a meal.      . lisinopril (PRINIVIL,ZESTRIL) 10 MG tablet Take 1 tablet (10 mg total) by mouth daily.  90 tablet  1  . loratadine (CLARITIN) 10 MG tablet Take 10 mg by mouth daily as needed.       . potassium chloride (KLOR-CON M10) 10 MEQ tablet Take 1 tablet (10 mEq total) by mouth daily.  90 tablet  1  . psyllium (REGULOID) 0.52 G capsule Take 0.52 g by mouth 2 (two) times daily.      . verapamil (COVERA HS) 180 MG (CO) 24 hr tablet Take 1 tablet (180 mg total) by mouth at bedtime.  90 tablet  1   No current facility-administered medications on file prior to visit.    Allergies  Allergen Reactions  . Sulfa Antibiotics   . Zebeta [Bisoprolol Fumarate]   . Penicillins Rash    Review of Systems  Review of Systems  Constitutional: Negative for fever and malaise/fatigue.  HENT: Negative for congestion.   Eyes: Negative for discharge.  Respiratory: Negative for shortness of breath.   Cardiovascular: Negative for chest pain, palpitations and leg swelling.  Gastrointestinal: Negative for nausea, abdominal pain and diarrhea.   Genitourinary: Negative for  dysuria.  Musculoskeletal: Negative for falls.  Skin: Negative for rash.  Neurological: Negative for loss of consciousness and headaches.  Endo/Heme/Allergies: Negative for polydipsia.  Psychiatric/Behavioral: Negative for depression and suicidal ideas. The patient is not nervous/anxious and does not have insomnia.     Objective  Pulse 70  Temp(Src) 98.7 F (37.1 C) (Oral)  Ht 5\' 3"  (1.6 m)  Wt 260 lb (117.935 kg)  BMI 46.07 kg/m2  SpO2 95%  Physical Exam  Physical Exam  Constitutional: She is oriented to person, place, and time and well-developed, well-nourished, and in no distress. No distress.  HENT:  Head: Normocephalic and atraumatic.  Eyes: Conjunctivae are normal.  Neck: Neck supple. No thyromegaly present.  Cardiovascular: Normal rate, regular rhythm and normal heart sounds.   No murmur heard. Pulmonary/Chest: Effort normal and breath sounds normal. She has no wheezes.  Abdominal: She exhibits no distension and no mass.  Musculoskeletal: She exhibits no edema.  Lymphadenopathy:    She has no cervical adenopathy.  Neurological: She is alert and oriented to person, place, and time.  Skin: Skin is warm and dry. No rash noted. She is not diaphoretic.  Psychiatric: Memory, affect and judgment normal.    Lab Results  Component Value Date   TSH 6.31* 10/04/2013   Lab Results  Component Value Date   WBC 6.1 01/03/2014   HGB 12.0 01/03/2014   HCT 36.9 01/03/2014   MCV 89 01/03/2014   PLT 234 01/03/2014   Lab Results  Component Value Date   CREATININE 1.02 11/16/2013   BUN 18 11/16/2013   NA 141 11/16/2013   K 4.7 11/16/2013   CL 104 11/16/2013   CO2 29 11/16/2013   Lab Results  Component Value Date   ALT 21 11/16/2013   AST 21 11/16/2013   ALKPHOS 82 11/16/2013   BILITOT 0.6 11/16/2013   Lab Results  Component Value Date   CHOL 142 11/16/2013   Lab Results  Component Value Date   HDL 51 11/16/2013   Lab Results  Component Value Date    LDLCALC 73 11/16/2013   Lab Results  Component Value Date   TRIG 90 11/16/2013   Lab Results  Component Value Date   CHOLHDL 2.8 11/16/2013     Assessment & Plan  Essential hypertension Well controlled, no changes to meds. Encouraged heart healthy diet such as the DASH diet and exercise as tolerated.   Unspecified sleep apnea Patient reports using Bipap  Unspecified constipation Encouraged increased hydration and fiber in diet. Daily probiotics. If bowels not moving can use MOM 2 tbls po in 4 oz of warm prune juice by mouth every 2-3 days. If no results then repeat in 4 hours with  Dulcolax suppository pr, may repeat again in 4 more hours as needed. Seek care if symptoms worsen. Consider daily Miralax and/or Dulcolax if symptoms persist.   Iron deficiency anemia Increase leafy greens, consider increased lean red meat and using cast iron cookware. Continue to monitor, report any concerns. Has improved with treatment by hematology with infusions  Personal history of colonic polyps Had colonoscopy in March 2015, repeat in 5 years per note  DDD (degenerative disc disease) Increased back pain with radicular symptoms. Is following with ortho. Good response to injections.

## 2014-02-25 NOTE — Assessment & Plan Note (Addendum)
Increase leafy greens, consider increased lean red meat and using cast iron cookware. Continue to monitor, report any concerns. Has improved with treatment by hematology with infusions

## 2014-02-25 NOTE — Assessment & Plan Note (Signed)
Encouraged increased hydration and fiber in diet. Daily probiotics. If bowels not moving can use MOM 2 tbls po in 4 oz of warm prune juice by mouth every 2-3 days. If no results then repeat in 4 hours with  Dulcolax suppository pr, may repeat again in 4 more hours as needed. Seek care if symptoms worsen. Consider daily Miralax and/or Dulcolax if symptoms persist.  

## 2014-02-26 ENCOUNTER — Telehealth: Payer: Self-pay | Admitting: Family Medicine

## 2014-02-26 NOTE — Telephone Encounter (Signed)
One of those offices might of called her. They will call her back though

## 2014-02-26 NOTE — Telephone Encounter (Signed)
Returning call.

## 2014-02-26 NOTE — Telephone Encounter (Signed)
All I see are referrals for eye doctor and pulmonology could that be what she was called about?

## 2014-02-26 NOTE — Telephone Encounter (Signed)
I don't see any notes saying we called pt or any recent labwork done that pt might of been being called about?

## 2014-03-23 ENCOUNTER — Encounter: Payer: Self-pay | Admitting: Internal Medicine

## 2014-03-23 ENCOUNTER — Ambulatory Visit (INDEPENDENT_AMBULATORY_CARE_PROVIDER_SITE_OTHER): Payer: Medicare Other | Admitting: Internal Medicine

## 2014-03-23 VITALS — BP 171/82 | HR 71 | Ht 63.0 in | Wt 266.0 lb

## 2014-03-23 DIAGNOSIS — E669 Obesity, unspecified: Secondary | ICD-10-CM

## 2014-03-23 DIAGNOSIS — I4891 Unspecified atrial fibrillation: Secondary | ICD-10-CM

## 2014-03-23 DIAGNOSIS — I10 Essential (primary) hypertension: Secondary | ICD-10-CM

## 2014-03-23 NOTE — Patient Instructions (Signed)
Your physician recommends that you schedule a follow-up appointment in 3 months with Dr Allred    

## 2014-03-25 NOTE — Progress Notes (Signed)
PCP:  [name], MD Primary Cardiologist:  Dr Meda Coffee  The patient presents today for routine electrophysiology followup.  Since last being seen in our clinic, the patient reports doing well.  With treatment of her anemia, her SOB is much improved.  She is pleased with her progress.  Her afib is controlled. She is primarily limited by arthritis. Today, she denies symptoms of palpitations, chest pain,   orthopnea, PND, lower extremity edema, dizziness, presyncope, syncope, or neurologic sequela.  The patient feels that she is tolerating medications without difficulties and is otherwise without complaint today.   Past Medical History  Diagnosis Date  . Hypertension   . Heart murmur   . Coronary artery disease   . Atrial fibrillation   . Anemia   . Arthritis   . Colon polyp   . Personal history of DVT (deep vein thrombosis)     x 2  . Hyperlipidemia   . Obesity   . Pancreatitis     post hysterectomy  . Sleep apnea   . Aortic stenosis     Very mild  . Hypercholesteremia   . OSA on CPAP   . PAF (paroxysmal atrial fibrillation)   . History of deep vein thrombophlebitis of lower extremity   . History of valvular heart disease     LEFT  . Chicken pox as a child  . Measles as a child  . Mumps child and teenager  . Allergy   . Obesity   . Unspecified constipation 11/16/2013  . Heme positive stool 11/16/2013  . DDD (degenerative disc disease) 11/16/2013  . History of DVT (deep vein thrombosis) 11/19/2013  . Unspecified sleep apnea 11/19/2013  . Allergic state 11/19/2013  . OA (osteoarthritis) 11/19/2013    S/p L TKR  . Benign paroxysmal positional vertigo 12/17/2013  . Abnormal results of thyroid function studies 12/17/2013  . Iron deficiency anemia 11/13/2013  . Personal history of colonic polyps 02/25/2014   Past Surgical History  Procedure Laterality Date  . Tonsilectomy, adenoidectomy, bilateral myringotomy and tubes  child  . Laminectomy  40 yrs ago  . Tubal ligation  80  years old  . Total abdominal hysterectomy  1995    had 2 tumors- benign  . Replacement total knee Left 2013  . Cyst removal hand Bilateral 20 yrs ago  . Meniscus repair Bilateral 2000 and 2010  . Appendectomy      Current Outpatient Prescriptions  Medication Sig Dispense Refill  . Acetaminophen (TYLENOL EX ST ARTHRITIS PAIN PO) Take 2 tablets by mouth as directed. Pt states she is taking 6 tablets total daily (03/23/14)      . amiodarone (PACERONE) 200 MG tablet Take 100 mg by mouth daily.      Marland Kitchen apixaban (ELIQUIS) 5 MG TABS tablet Take 1 tablet (5 mg total) by mouth 2 (two) times daily.  180 tablet  1  . atorvastatin (LIPITOR) 20 MG tablet Take 1 tablet (20 mg total) by mouth daily.  90 tablet  1  . bumetanide (BUMEX) 1 MG tablet Take 1 tablet (1 mg total) by mouth daily.  90 tablet  1  . calcium carbonate (OS-CAL) 600 MG TABS tablet Take 600 mg by mouth 2 (two) times daily with a meal.      . lisinopril (PRINIVIL,ZESTRIL) 10 MG tablet Take 1 tablet (10 mg total) by mouth daily.  90 tablet  1  . loratadine (CLARITIN) 10 MG tablet Take 10 mg by mouth daily as needed.       Marland Kitchen  potassium chloride (KLOR-CON M10) 10 MEQ tablet Take 1 tablet (10 mEq total) by mouth daily.  90 tablet  1  . psyllium (REGULOID) 0.52 G capsule Take 0.52 g by mouth 2 (two) times daily.      . traMADol (ULTRAM) 50 MG tablet Take 50 mg by mouth every 8 (eight) hours as needed (Pt. is currently out of this but might restart soon (03/23/14)).      . verapamil (COVERA HS) 180 MG (CO) 24 hr tablet Take 1 tablet (180 mg total) by mouth at bedtime.  90 tablet  1   No current facility-administered medications for this visit.    Allergies  Allergen Reactions  . Sulfa Antibiotics   . Zebeta [Bisoprolol Fumarate]   . Penicillins Rash    History   Social History  . Marital Status: Widowed    Spouse Name: N/A    Number of Children: N/A  . Years of Education: N/A   Occupational History  . Not on file.   Social  History Main Topics  . Smoking status: Never Smoker   . Smokeless tobacco: Never Used     Comment: never used tobacco  . Alcohol Use: No  . Drug Use: No  . Sexual Activity: No     Comment: lives [place], low sodium diet   Other Topics Concern  . Not on file   Social History Narrative  . No narrative on file    Family History  Problem Relation Age of Onset  . Heart attack Mother 69  . Hyperlipidemia Mother     ?  Marland Kitchen Dementia Mother   . Pernicious anemia Mother   . Heart disease Mother 78    MI  . COPD Father     smoker  . Cancer Father 67    prostate  . Heart disease Brother     quadruple bipass surgery  . Hyperlipidemia Brother   . Hypertension Brother   . Diabetes Maternal Grandmother     type 2  . Pernicious anemia Maternal Grandmother   . Colon cancer Neg Hx   . Pancreatic cancer Neg Hx   . Stomach cancer Neg Hx   . Throat cancer Neg Hx   . Liver disease Neg Hx   . Gout Son   . Atrial fibrillation Son   . Hyperlipidemia Son   . Cancer Maternal Grandfather     liver  . Cancer Paternal Grandmother     lung- doesn't think she smokes?  . Stroke Paternal Grandfather   . Cancer Son 50    non hodgin's lymphoma  . Gout Son   . Hyperlipidemia Son   . Sleep apnea Son     ROS-  All systems are reviewed and are negative except as outlined in the HPI above  Physical Exam: Filed Vitals:   03/23/14 1053  BP: 171/82  Pulse: 71  Height: 5\' 3"  (1.6 m)  Weight: 266 lb (120.657 kg)    GEN- The patient is overweight appearing, alert and oriented x 3 today.   Head- normocephalic, atraumatic Eyes-  Sclera clear, conjunctiva pink Ears- hearing intact Oropharynx- clear Neck- supple,  Lungs- Clear to ausculation bilaterally, normal work of breathing Heart- Regular rate and rhythm, no murmurs, rubs or gallops, PMI not laterally displaced GI- soft, NT, ND, + BS Extremities- no clubbing, cyanosis, or edema MS- no significant deformity or atrophy Neuro-  strength and sensation are intact  ekg today reveals sinus rhythm 71 bpm, PR 206, QTc  465, otherwise normal ekg Epic results including labs are reviewed today  Assessment and Plan:   1. SOB/ fatigue  improved  2. afib  Doing reasonably well Continue amiodarone 100mg  daily  Continue anticoagulation She would like to avoid ablation if possible Given her multiple comorbidities, I think that this is best at this time  3. htn  Stable (repeat by MD is 140/60).  She reports that her BP has been controlled at home No change required today   4. Anemia  Improved  5. Obesity Body mass index is 47.13 kg/(m^2). Weight reduction is strongly encouraged  Return to see me in 3 months

## 2014-03-26 ENCOUNTER — Encounter: Payer: Self-pay | Admitting: Family

## 2014-03-26 ENCOUNTER — Telehealth: Payer: Self-pay | Admitting: *Deleted

## 2014-03-26 ENCOUNTER — Ambulatory Visit (INDEPENDENT_AMBULATORY_CARE_PROVIDER_SITE_OTHER): Payer: Medicare Other | Admitting: Family

## 2014-03-26 VITALS — BP 136/78 | HR 80 | Temp 98.7°F | Resp 18 | Ht 63.0 in | Wt 264.1 lb

## 2014-03-26 DIAGNOSIS — N390 Urinary tract infection, site not specified: Secondary | ICD-10-CM | POA: Insufficient documentation

## 2014-03-26 DIAGNOSIS — R310 Gross hematuria: Secondary | ICD-10-CM

## 2014-03-26 DIAGNOSIS — N3000 Acute cystitis without hematuria: Secondary | ICD-10-CM

## 2014-03-26 DIAGNOSIS — N3001 Acute cystitis with hematuria: Secondary | ICD-10-CM

## 2014-03-26 DIAGNOSIS — R319 Hematuria, unspecified: Secondary | ICD-10-CM

## 2014-03-26 LAB — CBC WITH DIFFERENTIAL/PLATELET
Basophils Absolute: 0 10*3/uL (ref 0.0–0.1)
Basophils Relative: 0 % (ref 0–1)
Eosinophils Absolute: 0.1 10*3/uL (ref 0.0–0.7)
Eosinophils Relative: 1 % (ref 0–5)
HCT: 34.5 % — ABNORMAL LOW (ref 36.0–46.0)
HEMOGLOBIN: 12.1 g/dL (ref 12.0–15.0)
LYMPHS ABS: 1 10*3/uL (ref 0.7–4.0)
LYMPHS PCT: 9 % — AB (ref 12–46)
MCH: 32.3 pg (ref 26.0–34.0)
MCHC: 35.1 g/dL (ref 30.0–36.0)
MCV: 92 fL (ref 78.0–100.0)
MONOS PCT: 6 % (ref 3–12)
Monocytes Absolute: 0.6 10*3/uL (ref 0.1–1.0)
NEUTROS ABS: 8.9 10*3/uL — AB (ref 1.7–7.7)
NEUTROS PCT: 84 % — AB (ref 43–77)
Platelets: 231 10*3/uL (ref 150–400)
RBC: 3.75 MIL/uL — ABNORMAL LOW (ref 3.87–5.11)
RDW: 13.1 % (ref 11.5–15.5)
WBC: 10.6 10*3/uL — ABNORMAL HIGH (ref 4.0–10.5)

## 2014-03-26 LAB — POCT URINALYSIS DIPSTICK
Bilirubin, UA: NEGATIVE
GLUCOSE UA: NEGATIVE
Ketones, UA: NEGATIVE
NITRITE UA: POSITIVE
Protein, UA: 100
Spec Grav, UA: 1.01
Urobilinogen, UA: 0.2
pH, UA: 7.5

## 2014-03-26 MED ORDER — CEPHALEXIN 500 MG PO CAPS
500.0000 mg | ORAL_CAPSULE | Freq: Four times a day (QID) | ORAL | Status: DC
Start: 2014-03-26 — End: 2014-03-26

## 2014-03-26 NOTE — Patient Instructions (Addendum)
Complete blood work prior to leaving.  Hold Eliquis for 3 days. If blood in urine is no longer visible after 3 days, may restart Eliquis. Start Keflex for urinary tract infection. Call if symptoms worsen or if not improved in 2-3 days. Follow up with Dr. Charlett Blake in 1 week.

## 2014-03-26 NOTE — Assessment & Plan Note (Signed)
Complicated by Eliquis.  I have advised pt to hold Eliquis x 3 days. We discussed risks/benefits of Eliquis in this situation and I think that risk of continuing med outweighs benefit in setting of acute hematuria. Will also send urine for culture (UA suggests UTI). Rx with Keflex, check cbc to evaluate for anemia.  After treatment of her infection, she will ultimately need follow up UA to make sure that no microcytic hematuria remains.  If persistent microscopic hematuria, will need urology referral.

## 2014-03-26 NOTE — Telephone Encounter (Signed)
Message copied by Ronny Flurry on Mon Mar 26, 2014 12:03 PM ------      Message from: O'SULLIVAN, MELISSA      Created: Mon Mar 26, 2014 11:27 AM       Should be BID on Keflex not QID.  Could you please contact the pharmacy and change? ------

## 2014-03-26 NOTE — Telephone Encounter (Signed)
Spoke with pharmacist and changed directions as requested below.

## 2014-03-26 NOTE — Progress Notes (Signed)
Pre visit review using our clinic review tool, if applicable. No additional management support is needed unless otherwise documented below in the visit note. 

## 2014-03-26 NOTE — Progress Notes (Signed)
Subjective:    Patient ID: Tracy Miles, female    DOB: [birth year], 80 y.o.   MRN: [id number]  HPI  Tracy Miles is a 80 yr old female who presents today with chief complaint of gross hematuria- she is maintained on Eliquis due to hx of Atrial fibrillation. Pt reports urinary frequency x 2-3 days. Had dysuria yesterday. Has + clots in blood. Did not take her diuretics this AM. She denies associated fever. She reports that she has low back pain which is being evaluated by Dr. Latanya Maudlin. She reports that her back pain started 2-3 month ago. Has hx of laminectomy.  Reports pain radiates down the right leg.  Has had 7 weeks of PT.   This back pain is not new and has not worsened since she developed her urinary symptoms.     Review of Systems See HPI   Past Medical History  Diagnosis Date  . Hypertension   . Heart murmur   . Coronary artery disease   . Atrial fibrillation   . Anemia   . Arthritis   . Colon polyp   . Personal history of DVT (deep vein thrombosis)     x 2  . Hyperlipidemia   . Obesity   . Pancreatitis     post hysterectomy  . Sleep apnea   . Aortic stenosis     Very mild  . Hypercholesteremia   . OSA on CPAP   . PAF (paroxysmal atrial fibrillation)   . History of deep vein thrombophlebitis of lower extremity   . History of valvular heart disease     LEFT  . Chicken pox as a child  . Measles as a child  . Mumps child and teenager  . Allergy   . Obesity   . Unspecified constipation 11/16/2013  . Heme positive stool 11/16/2013  . DDD (degenerative disc disease) 11/16/2013  . History of DVT (deep vein thrombosis) 11/19/2013  . Unspecified sleep apnea 11/19/2013  . Allergic state 11/19/2013  . OA (osteoarthritis) 11/19/2013    S/p L TKR  . Benign paroxysmal positional vertigo 12/17/2013  . Abnormal results of thyroid function studies 12/17/2013  . Iron deficiency anemia 11/13/2013  . Personal history of colonic polyps 02/25/2014    History   Social History  .  Marital Status: Widowed    Spouse Name: N/A    Number of Children: N/A  . Years of Education: N/A   Occupational History  . Not on file.   Social History Main Topics  . Smoking status: Never Smoker   . Smokeless tobacco: Never Used     Comment: never used tobacco  . Alcohol Use: No  . Drug Use: No  . Sexual Activity: No     Comment: [place], low sodium diet   Other Topics Concern  . Not on file   Social History Narrative  . No narrative on file    Past Surgical History  Procedure Laterality Date  . Tonsilectomy, adenoidectomy, bilateral myringotomy and tubes  child  . Laminectomy  42 yrs ago  . Tubal ligation  80 years old  . Total abdominal hysterectomy  1995    had 2 tumors- benign  . Replacement total knee Left 2013  . Cyst removal hand Bilateral 20 yrs ago  . Meniscus repair Bilateral 2000 and 2010  . Appendectomy      Family History  Problem Relation Age of Onset  . Heart attack Mother 10  . Hyperlipidemia Mother     ?  Marland Kitchen  Dementia Mother   . Pernicious anemia Mother   . Heart disease Mother 71    MI  . COPD Father     smoker  . Cancer Father 54    prostate  . Heart disease Brother     quadruple bipass surgery  . Hyperlipidemia Brother   . Hypertension Brother   . Diabetes Maternal Grandmother     type 2  . Pernicious anemia Maternal Grandmother   . Colon cancer Neg Hx   . Pancreatic cancer Neg Hx   . Stomach cancer Neg Hx   . Throat cancer Neg Hx   . Liver disease Neg Hx   . Gout Son   . Atrial fibrillation Son   . Hyperlipidemia Son   . Cancer Maternal Grandfather     liver  . Cancer Paternal Grandmother     lung- doesn't think she smokes?  . Stroke Paternal Grandfather   . Cancer Son 50    non hodgin's lymphoma  . Gout Son   . Hyperlipidemia Son   . Sleep apnea Son     Allergies  Allergen Reactions  . Sulfa Antibiotics   . Zebeta [Bisoprolol Fumarate]   . Penicillins Rash    Current Outpatient Prescriptions on  File Prior to Visit  Medication Sig Dispense Refill  . Acetaminophen (TYLENOL EX ST ARTHRITIS PAIN PO) Take 2 tablets by mouth as directed. Pt states she is taking 6 tablets total daily (03/23/14)      . amiodarone (PACERONE) 200 MG tablet Take 100 mg by mouth daily.      Marland Kitchen apixaban (ELIQUIS) 5 MG TABS tablet Take 1 tablet (5 mg total) by mouth 2 (two) times daily.  180 tablet  1  . atorvastatin (LIPITOR) 20 MG tablet Take 1 tablet (20 mg total) by mouth daily.  90 tablet  1  . bumetanide (BUMEX) 1 MG tablet Take 1 tablet (1 mg total) by mouth daily.  90 tablet  1  . calcium carbonate (OS-CAL) 600 MG TABS tablet Take 600 mg by mouth 2 (two) times daily with a meal.      . lisinopril (PRINIVIL,ZESTRIL) 10 MG tablet Take 1 tablet (10 mg total) by mouth daily.  90 tablet  1  . loratadine (CLARITIN) 10 MG tablet Take 10 mg by mouth daily as needed.       . potassium chloride (KLOR-CON M10) 10 MEQ tablet Take 1 tablet (10 mEq total) by mouth daily.  90 tablet  1  . psyllium (REGULOID) 0.52 G capsule Take 0.52 g by mouth 2 (two) times daily.      . traMADol (ULTRAM) 50 MG tablet Take 50 mg by mouth every 8 (eight) hours as needed (Pt states she is taking 20mg  every 6-8 hours as needed for pain. 03/26/14).       . verapamil (COVERA HS) 180 MG (CO) 24 hr tablet Take 1 tablet (180 mg total) by mouth at bedtime.  90 tablet  1   No current facility-administered medications on file prior to visit.    BP 136/78  Pulse 80  Temp(Src) 98.7 F (37.1 C) (Oral)  Resp 18  Ht 5\' 3"  (1.6 m)  Wt 264 lb 1.3 oz (119.786 kg)  BMI 46.79 kg/m2  SpO2 98%       Objective:   Physical Exam  Constitutional: She is oriented to person, place, and time. She appears well-developed and well-nourished. No distress.  HENT:  Head: Normocephalic and atraumatic.  Cardiovascular: Normal rate and  regular rhythm.   No murmur heard. Pulmonary/Chest: Effort normal and breath sounds normal. No respiratory distress. She has no  wheezes. She has no rales. She exhibits no tenderness.  Abdominal: Soft. Bowel sounds are normal. She exhibits no distension. There is no tenderness. There is no rebound and no CVA tenderness.  Musculoskeletal:  Lower lumbar tenderness to palpation on right.  Neurological: She is alert and oriented to person, place, and time.  Psychiatric: She has a normal mood and affect. Her behavior is normal. Judgment and thought content normal.          Assessment & Plan:

## 2014-03-30 ENCOUNTER — Encounter: Payer: Self-pay | Admitting: Family

## 2014-03-30 LAB — URINE CULTURE

## 2014-04-02 ENCOUNTER — Encounter: Payer: Self-pay | Admitting: Family

## 2014-04-02 ENCOUNTER — Ambulatory Visit (INDEPENDENT_AMBULATORY_CARE_PROVIDER_SITE_OTHER): Payer: Medicare Other | Admitting: Family

## 2014-04-02 VITALS — BP 130/60 | HR 74 | Temp 97.8°F | Resp 16 | Ht 63.0 in | Wt 263.0 lb

## 2014-04-02 DIAGNOSIS — N3000 Acute cystitis without hematuria: Secondary | ICD-10-CM

## 2014-04-02 DIAGNOSIS — R319 Hematuria, unspecified: Secondary | ICD-10-CM

## 2014-04-02 DIAGNOSIS — Z23 Encounter for immunization: Secondary | ICD-10-CM

## 2014-04-02 DIAGNOSIS — M48061 Spinal stenosis, lumbar region without neurogenic claudication: Secondary | ICD-10-CM | POA: Insufficient documentation

## 2014-04-02 DIAGNOSIS — N3001 Acute cystitis with hematuria: Secondary | ICD-10-CM

## 2014-04-02 LAB — POCT URINALYSIS DIPSTICK
Bilirubin, UA: NEGATIVE
Blood, UA: NEGATIVE
Glucose, UA: NEGATIVE
Ketones, UA: NEGATIVE
Leukocytes, UA: NEGATIVE
Nitrite, UA: NEGATIVE
PROTEIN UA: NEGATIVE
Spec Grav, UA: <=1.005
UROBILINOGEN UA: 0.2
pH, UA: 7

## 2014-04-02 NOTE — Progress Notes (Signed)
Subjective:    Patient ID: Tracy Miles, female    DOB: [birth year], 80 y.o.   MRN: [id number]  HPI  Tracy Miles is a 80 yr old female who presents today for follow up.  1) Hematuria- she was evaluated on 7/27 due to gross hematuria.  She was on eliquis due to hx of AF.  She was treated with keflex for UTI. CBC was obtained and no anemia was noted.  Urine culture grew 10 K of citrobacter which was sensitive to cephalosporins. She reports no dysuria.  Frequency is at baseline due to diuretics.  Notes that she discontinued eliquis x 3 days and has now resumed without any hematuria.   2) Low back pain- reports that she was told that she is a poor surgical candidate and that her pain is secondary to spinal stenosis by Dr. Latanya Maudlin. She will meet with Dr. Jacelyn Grip on Wednesday. She will have a spinal injection.  She notes that this pain is really interfering with her quality of life.    Review of Systems See HPI  Past Medical History  Diagnosis Date  . Hypertension   . Heart murmur   . Coronary artery disease   . Atrial fibrillation   . Anemia   . Arthritis   . Colon polyp   . Personal history of DVT (deep vein thrombosis)     x 2  . Hyperlipidemia   . Obesity   . Pancreatitis     post hysterectomy  . Sleep apnea   . Aortic stenosis     Very mild  . Hypercholesteremia   . OSA on CPAP   . PAF (paroxysmal atrial fibrillation)   . History of deep vein thrombophlebitis of lower extremity   . History of valvular heart disease     LEFT  . Chicken pox as a child  . Measles as a child  . Mumps child and teenager  . Allergy   . Obesity   . Unspecified constipation 11/16/2013  . Heme positive stool 11/16/2013  . DDD (degenerative disc disease) 11/16/2013  . History of DVT (deep vein thrombosis) 11/19/2013  . Unspecified sleep apnea 11/19/2013  . Allergic state 11/19/2013  . OA (osteoarthritis) 11/19/2013    S/p L TKR  . Benign paroxysmal positional vertigo 12/17/2013  . Abnormal results  of thyroid function studies 12/17/2013  . Iron deficiency anemia 11/13/2013  . Personal history of colonic polyps 02/25/2014    History   Social History  . Marital Status: Widowed    Spouse Name: N/A    Number of Children: N/A  . Years of Education: N/A   Occupational History  . Not on file.   Social History Main Topics  . Smoking status: Never Smoker   . Smokeless tobacco: Never Used     Comment: never used tobacco  . Alcohol Use: No  . Drug Use: No  . Sexual Activity: No     Comment: lives [place], low sodium diet   Other Topics Concern  . Not on file   Social History Narrative  . No narrative on file    Past Surgical History  Procedure Laterality Date  . Tonsilectomy, adenoidectomy, bilateral myringotomy and tubes  child  . Laminectomy  40 yrs ago  . Tubal ligation  80 years old  . Total abdominal hysterectomy  1995    had 2 tumors- benign  . Replacement total knee Left 2013  . Cyst removal hand Bilateral 20 yrs ago  .  Meniscus repair Bilateral 2000 and 2010  . Appendectomy      Family History  Problem Relation Age of Onset  . Heart attack Mother 61  . Hyperlipidemia Mother     ?  Marland Kitchen Dementia Mother   . Pernicious anemia Mother   . Heart disease Mother 66    MI  . COPD Father     smoker  . Cancer Father 63    prostate  . Heart disease Brother     quadruple bipass surgery  . Hyperlipidemia Brother   . Hypertension Brother   . Diabetes Maternal Grandmother     type 2  . Pernicious anemia Maternal Grandmother   . Colon cancer Neg Hx   . Pancreatic cancer Neg Hx   . Stomach cancer Neg Hx   . Throat cancer Neg Hx   . Liver disease Neg Hx   . Gout Son   . Atrial fibrillation Son   . Hyperlipidemia Son   . Cancer Maternal Grandfather     liver  . Cancer Paternal Grandmother     lung- doesn't think she smokes?  . Stroke Paternal Grandfather   . Cancer Son 50    non hodgin's lymphoma  . Gout Son   . Hyperlipidemia Son   . Sleep apnea  Son     Allergies  Allergen Reactions  . Sulfa Antibiotics   . Zebeta [Bisoprolol Fumarate]   . Penicillins Rash    Current Outpatient Prescriptions on File Prior to Visit  Medication Sig Dispense Refill  . Acetaminophen (TYLENOL EX ST ARTHRITIS PAIN PO) Take 2 tablets by mouth as directed. Pt states she is taking 6 tablets total daily (03/23/14)      . amiodarone (PACERONE) 200 MG tablet Take 100 mg by mouth daily.      Marland Kitchen apixaban (ELIQUIS) 5 MG TABS tablet Take 1 tablet (5 mg total) by mouth 2 (two) times daily.  180 tablet  1  . atorvastatin (LIPITOR) 20 MG tablet Take 1 tablet (20 mg total) by mouth daily.  90 tablet  1  . bumetanide (BUMEX) 1 MG tablet Take 1 tablet (1 mg total) by mouth daily.  90 tablet  1  . calcium carbonate (OS-CAL) 600 MG TABS tablet Take 600 mg by mouth 2 (two) times daily with a meal.      . lisinopril (PRINIVIL,ZESTRIL) 10 MG tablet Take 1 tablet (10 mg total) by mouth daily.  90 tablet  1  . loratadine (CLARITIN) 10 MG tablet Take 10 mg by mouth daily as needed.       . potassium chloride (KLOR-CON M10) 10 MEQ tablet Take 1 tablet (10 mEq total) by mouth daily.  90 tablet  1  . psyllium (REGULOID) 0.52 G capsule Take 0.52 g by mouth 2 (two) times daily.      . traMADol (ULTRAM) 50 MG tablet Take 50 mg by mouth every 8 (eight) hours as needed (Pt states she is taking 20mg  every 6-8 hours as needed for pain. 03/26/14).       . verapamil (COVERA HS) 180 MG (CO) 24 hr tablet Take 1 tablet (180 mg total) by mouth at bedtime.  90 tablet  1   No current facility-administered medications on file prior to visit.    BP 130/60  Pulse 74  Temp(Src) 97.8 F (36.6 C) (Oral)  Resp 16  Ht 5\' 3"  (1.6 m)  Wt 263 lb 0.6 oz (119.314 kg)  BMI 46.61 kg/m2  SpO2 97%  Objective:   Physical Exam  Constitutional: She is oriented to person, place, and time. She appears well-developed and well-nourished. No distress.  HENT:  Head: Normocephalic and atraumatic.    Cardiovascular: Normal rate and regular rhythm.   No murmur heard. Pulmonary/Chest: Effort normal and breath sounds normal. No respiratory distress. She has no wheezes. She has no rales. She exhibits no tenderness.  Abdominal: Soft. There is no tenderness.  Neurological: She is alert and oriented to person, place, and time.  Psychiatric: She has a normal mood and affect.          Assessment & Plan:

## 2014-04-02 NOTE — Patient Instructions (Signed)
Please call if you develop recurrent blood in your urine. Follow up with Dr. Charlett Blake in 3 months.

## 2014-04-02 NOTE — Assessment & Plan Note (Signed)
Management per ortho. 

## 2014-04-02 NOTE — Assessment & Plan Note (Signed)
Resolved.  UA neg for microscopic hematuria.

## 2014-04-02 NOTE — Progress Notes (Signed)
Pre visit review using our clinic review tool, if applicable. No additional management support is needed unless otherwise documented below in the visit note. 

## 2014-04-02 NOTE — Addendum Note (Signed)
Addended by: Kelle Darting A on: 04/02/2014 10:49 AM   Modules accepted: Orders

## 2014-04-04 ENCOUNTER — Telehealth: Payer: Self-pay | Admitting: Internal Medicine

## 2014-04-04 NOTE — Telephone Encounter (Signed)
Spoke with patient and she is having an epiduaral in her spine for back pain.  Dr Mina Marble with [facility] is doing the procedure and Dr Rhona Raider is her ortho.  The scheduled for 9/8 and needs recommendations for stopping Xarelto prior to epidural.  Please advise

## 2014-04-04 NOTE — Telephone Encounter (Signed)
New problem:     Per pt Dr Normajean Glasgow want pt to stop Eliquest for 7 days before her procuedure 9/8 @ 1:45pm.   Pt has questions about this and would like a call back.

## 2014-04-09 NOTE — Telephone Encounter (Signed)
Hold xarelto 3 days prior to the procedure and resume when Dr Mina Marble feels appropriate

## 2014-04-12 NOTE — Telephone Encounter (Signed)
Pt.notified

## 2014-04-12 NOTE — Telephone Encounter (Signed)
Faxed to Dr. Normajean Glasgow.

## 2014-04-19 ENCOUNTER — Ambulatory Visit (INDEPENDENT_AMBULATORY_CARE_PROVIDER_SITE_OTHER): Payer: Medicare Other | Admitting: Pulmonary Disease

## 2014-04-19 ENCOUNTER — Encounter: Payer: Self-pay | Admitting: Pulmonary Disease

## 2014-04-19 VITALS — BP 124/76 | HR 72 | Temp 98.7°F | Ht 63.5 in | Wt 261.0 lb

## 2014-04-19 DIAGNOSIS — G4730 Sleep apnea, unspecified: Secondary | ICD-10-CM

## 2014-04-19 NOTE — Assessment & Plan Note (Signed)
We will get you new biPAP supplies Unclear why on bipap Check download on your machine Call us as needed  Weight loss encouraged, compliance with goal of at least 4-6 hrs every night is the expectation. Advised against medications with sedative side effects Cautioned against driving when sleepy - understanding that sleepiness will vary on a day to day basis

## 2014-04-19 NOTE — Progress Notes (Signed)
Subjective:    Patient ID: Tracy Miles, female    DOB: 1933-12-28, 80 y.o.   MRN: [id number]  HPI  The supple is a 80 year old woman with obstructive sleep apnea presents for management. She is moved from [place] to [place] about a year back. She needs to establish with a new DME.  She underwent polysomnogram more than 10 years ago and was told that she has moderate sleep apnea with AHI of approximately 20 per hour and has been maintained on a BiPAP with nasal pillows and humidity. She reports occasional naps but does feel rested on waking up. Epworth sleepiness score is 5 She admits to being a night owl, bedtime is around midnight, sleep latency is minimal around 10 minutes she sleeps on her side with one pillow, reports one to 2 nocturnal awakenings due to back pain, denies nocturia and is out of bed by 6:30 AM feeling rested without headaches or dryness of mouth. She's gained about 20 pounds since her last titration study. She does have atrial fibrillation , controlled on amiodarone and well-controlled hypertension There is no history suggestive of cataplexy, sleep paralysis or parasomnias She is a retired Radio producer She reports compliance with her BiPAP machine Denies problems with dryness or nasal stuffiness  Past Medical History  Diagnosis Date  . Hypertension   . Heart murmur   . Coronary artery disease   . Atrial fibrillation   . Anemia   . Arthritis   . Colon polyp   . Personal history of DVT (deep vein thrombosis)     x 2  . Hyperlipidemia   . Obesity   . Pancreatitis     post hysterectomy  . Sleep apnea   . Aortic stenosis     Very mild  . Hypercholesteremia   . OSA on CPAP   . PAF (paroxysmal atrial fibrillation)   . History of deep vein thrombophlebitis of lower extremity   . History of valvular heart disease     LEFT  . Chicken pox as a child  . Measles as a child  . Mumps child and teenager  . Allergy   . Obesity   . Unspecified  constipation 11/16/2013  . Heme positive stool 11/16/2013  . DDD (degenerative disc disease) 11/16/2013  . History of DVT (deep vein thrombosis) 11/19/2013  . Unspecified sleep apnea 11/19/2013  . Allergic state 11/19/2013  . OA (osteoarthritis) 11/19/2013    S/p L TKR  . Benign paroxysmal positional vertigo 12/17/2013  . Abnormal results of thyroid function studies 12/17/2013  . Iron deficiency anemia 11/13/2013  . Personal history of colonic polyps 02/25/2014    Past Surgical History  Procedure Laterality Date  . Tonsilectomy, adenoidectomy, bilateral myringotomy and tubes  child  . Laminectomy  40 yrs ago  . Tubal ligation  80 years old  . Total abdominal hysterectomy  1995    had 2 tumors- benign  . Replacement total knee Left 2013  . Cyst removal hand Bilateral 20 yrs ago  . Meniscus repair Bilateral 2000 and 2010  . Appendectomy      Allergies  Allergen Reactions  . Sulfa Antibiotics   . Zebeta [Bisoprolol Fumarate]   . Penicillins Rash    History   Social History  . Marital Status: Widowed    Spouse Name: N/A    Number of Children: 3  . Years of Education: N/A   Occupational History  . retired Pharmacist, hospital    Social History Main Topics  . Smoking status:  Never Smoker   . Smokeless tobacco: Never Used     Comment: never used tobacco  . Alcohol Use: No  . Drug Use: No  . Sexual Activity: No     Comment: lives at [place], low sodium diet   Other Topics Concern  . Not on file   Social History Narrative  . No narrative on file    Family History  Problem Relation Age of Onset  . Heart attack Mother 21  . Hyperlipidemia Mother     ?  Marland Kitchen Dementia Mother   . Pernicious anemia Mother   . COPD Father     smoker  . Cancer Father 13    prostate  . Heart disease Brother     quadruple bipass surgery  . Hyperlipidemia Brother   . Hypertension Brother   . Diabetes Maternal Grandmother     type 2  . Pernicious anemia Maternal Grandmother   . Colon cancer Neg  Hx   . Pancreatic cancer Neg Hx   . Stomach cancer Neg Hx   . Throat cancer Neg Hx   . Liver disease Neg Hx   . Gout Son   . Atrial fibrillation Son   . Hyperlipidemia Son   . Cancer Maternal Grandfather     liver  . Cancer Paternal Grandmother     lung- doesn't think she smokes?  . Stroke Paternal Grandfather   . Cancer Son 50    non hodgin's lymphoma  . Gout Son   . Hyperlipidemia Son   . Sleep apnea Son       Review of Systems  Constitutional: Positive for unexpected weight change. Negative for fever.  HENT: Negative for congestion, dental problem, ear pain, nosebleeds, postnasal drip, rhinorrhea, sinus pressure, sneezing, sore throat and trouble swallowing.   Eyes: Negative for redness and itching.  Respiratory: Negative for cough, chest tightness, shortness of breath and wheezing.   Cardiovascular: Positive for palpitations and leg swelling.  Gastrointestinal: Negative for nausea and vomiting.  Genitourinary: Negative for dysuria.  Musculoskeletal: Positive for arthralgias. Negative for joint swelling.  Skin: Negative for rash.  Neurological: Negative for headaches.  Hematological: Does not bruise/bleed easily.  Psychiatric/Behavioral: Negative for dysphoric mood. The patient is not nervous/anxious.        Objective:   Physical Exam  Gen. Pleasant, obese, in no distress, normal affect ENT - no lesions, no post nasal drip, class 2-3 airway Neck: No JVD, no thyromegaly, no carotid bruits Lungs: no use of accessory muscles, no dullness to percussion, decreased without rales or rhonchi  Cardiovascular: Rhythm regular, heart sounds  normal, no murmurs or gallops, no peripheral edema Abdomen: soft and non-tender, no hepatosplenomegaly, BS normal. Musculoskeletal: No deformities, no cyanosis or clubbing Neuro:  alert, non focal, no tremors       Assessment & Plan:

## 2014-04-19 NOTE — Patient Instructions (Signed)
We will get you new CPAP supplies Check download on your machine Call us as needed

## 2014-04-23 ENCOUNTER — Ambulatory Visit (HOSPITAL_BASED_OUTPATIENT_CLINIC_OR_DEPARTMENT_OTHER)
Admission: RE | Admit: 2014-04-23 | Discharge: 2014-04-23 | Disposition: A | Discharge status: Home / Self Care | Payer: Medicare Other | Source: Ambulatory Visit | Attending: Family Medicine | Admitting: Family Medicine

## 2014-04-23 DIAGNOSIS — Z1231 Encounter for screening mammogram for malignant neoplasm of breast: Secondary | ICD-10-CM | POA: Diagnosis not present

## 2014-04-26 ENCOUNTER — Telehealth: Payer: Self-pay | Admitting: Pulmonary Disease

## 2014-04-26 NOTE — Telephone Encounter (Signed)
Baseline PSG 12/2005 >> TST 159 mins, RDI 63/h, lowest satn 74% Bipap 16/12 required, CPAP did not correct

## 2014-06-25 ENCOUNTER — Ambulatory Visit (INDEPENDENT_AMBULATORY_CARE_PROVIDER_SITE_OTHER): Payer: Medicare Other | Admitting: Internal Medicine

## 2014-06-25 ENCOUNTER — Encounter: Payer: Self-pay | Admitting: Internal Medicine

## 2014-06-25 VITALS — BP 130/88 | HR 70 | Ht 63.5 in | Wt 265.0 lb

## 2014-06-25 DIAGNOSIS — I10 Essential (primary) hypertension: Secondary | ICD-10-CM

## 2014-06-25 DIAGNOSIS — I480 Paroxysmal atrial fibrillation: Secondary | ICD-10-CM

## 2014-06-25 DIAGNOSIS — E669 Obesity, unspecified: Secondary | ICD-10-CM

## 2014-06-25 NOTE — Patient Instructions (Signed)
Your physician recommends that you schedule a follow-up appointment in: 3 months in the afib clinic at the hospital   Your physician has recommended you make the following change in your medication:  1) Stop Amiodarone

## 2014-06-25 NOTE — Progress Notes (Signed)
PCP:  [name], MD Primary Cardiologist:  Dr Meda Coffee  The patient presents today for routine electrophysiology followup.  She denies any awareness of afib. Wanting to stop amiodarone to see how she does due to concern of long term side effects of drug. Biggest issue is back problems with h/o spinal stenosis and is off eliqius pending an epidural. Previous bleeding issues with xarelto but is doing well with Eliquis.   Today, she denies symptoms of palpitations, chest pain,   orthopnea, PND, lower extremity edema, dizziness, presyncope, syncope, or neurologic sequela.  The patient feels that she is tolerating medications without difficulties and is otherwise without complaint today.   Past Medical History  Diagnosis Date  . Hypertension   . Heart murmur   . Coronary artery disease   . Atrial fibrillation   . Anemia   . Arthritis   . Colon polyp   . Personal history of DVT (deep vein thrombosis)     x 2  . Hyperlipidemia   . Obesity   . Pancreatitis     post hysterectomy  . Sleep apnea   . Aortic stenosis     Very mild  . Hypercholesteremia   . OSA on CPAP   . PAF (paroxysmal atrial fibrillation)   . History of deep vein thrombophlebitis of lower extremity   . History of valvular heart disease     LEFT  . Chicken pox as a child  . Measles as a child  . Mumps child and teenager  . Allergy   . Obesity   . Unspecified constipation 11/16/2013  . Heme positive stool 11/16/2013  . DDD (degenerative disc disease) 11/16/2013  . History of DVT (deep vein thrombosis) 11/19/2013  . Unspecified sleep apnea 11/19/2013  . Allergic state 11/19/2013  . OA (osteoarthritis) 11/19/2013    S/p L TKR  . Benign paroxysmal positional vertigo 12/17/2013  . Abnormal results of thyroid function studies 12/17/2013  . Iron deficiency anemia 11/13/2013  . Personal history of colonic polyps 02/25/2014   Past Surgical History  Procedure Laterality Date  . Tonsilectomy, adenoidectomy, bilateral  myringotomy and tubes  child  . Laminectomy  40 yrs ago  . Tubal ligation  80 years old  . Total abdominal hysterectomy  1995    had 2 tumors- benign  . Replacement total knee Left 2013  . Cyst removal hand Bilateral 20 yrs ago  . Meniscus repair Bilateral 2000 and 2010  . Appendectomy      Current Outpatient Prescriptions  Medication Sig Dispense Refill  . apixaban (ELIQUIS) 5 MG TABS tablet Take 5 mg by mouth 2 (two) times daily. Pt stopped until 3 days prior to 06/27/14      . atorvastatin (LIPITOR) 20 MG tablet Take 1 tablet (20 mg total) by mouth daily.  90 tablet  1  . bumetanide (BUMEX) 1 MG tablet Take 1 tablet (1 mg total) by mouth daily.  90 tablet  1  . calcium carbonate (OS-CAL) 600 MG TABS tablet Take 600 mg by mouth 2 (two) times daily with a meal.      . gabapentin (NEURONTIN) 300 MG capsule Take 300 mg by mouth 3 (three) times daily.      Marland Kitchen ibuprofen (ADVIL,MOTRIN) 200 MG tablet Take 200 mg by mouth every 6 (six) hours as needed.      Marland Kitchen lisinopril (PRINIVIL,ZESTRIL) 10 MG tablet Take 1 tablet (10 mg total) by mouth daily.  90 tablet  1  .  loratadine (CLARITIN) 10 MG tablet Take 10 mg by mouth daily as needed.       . potassium chloride (KLOR-CON M10) 10 MEQ tablet Take 1 tablet (10 mEq total) by mouth daily.  90 tablet  1  . psyllium (REGULOID) 0.52 G capsule Take 0.52 g by mouth 2 (two) times daily.      . verapamil (COVERA HS) 180 MG (CO) 24 hr tablet Take 1 tablet (180 mg total) by mouth at bedtime.  90 tablet  1   No current facility-administered medications for this visit.    Allergies  Allergen Reactions  . Sulfa Antibiotics   . Zebeta [Bisoprolol Fumarate]   . Penicillins Rash    History   Social History  . Marital Status: Widowed    Spouse Name: N/A    Number of Children: 3  . Years of Education: N/A   Occupational History  . retired Pharmacist, hospital    Social History Main Topics  . Smoking status: Never Smoker   . Smokeless tobacco: Never Used      Comment: never used tobacco  . Alcohol Use: No  . Drug Use: No  . Sexual Activity: No     Comment: lives [place] [facility], low sodium diet   Other Topics Concern  . Not on file   Social History Narrative  . No narrative on file    Family History  Problem Relation Age of Onset  . Heart attack Mother 98  . Hyperlipidemia Mother     ?  Marland Kitchen Dementia Mother   . Pernicious anemia Mother   . COPD Father     smoker  . Cancer Father 40    prostate  . Heart disease Brother     quadruple bipass surgery  . Hyperlipidemia Brother   . Hypertension Brother   . Diabetes Maternal Grandmother     type 2  . Pernicious anemia Maternal Grandmother   . Colon cancer Neg Hx   . Pancreatic cancer Neg Hx   . Stomach cancer Neg Hx   . Throat cancer Neg Hx   . Liver disease Neg Hx   . Gout Son   . Atrial fibrillation Son   . Hyperlipidemia Son   . Cancer Maternal Grandfather     liver  . Cancer Paternal Grandmother     lung- doesn't think she smokes?  . Stroke Paternal Grandfather   . Cancer Son 50    non hodgin's lymphoma  . Gout Son   . Hyperlipidemia Son   . Sleep apnea Son     ROS-  All systems are reviewed and are negative except as outlined in the HPI above  Physical Exam: Filed Vitals:   06/25/14 1107  BP: 130/88  Pulse: 70  Height: 5' 3.5" (1.613 m)  Weight: 265 lb (120.203 kg)    GEN- The patient is overweight appearing, alert and oriented x 3 today.   Head- normocephalic, atraumatic Eyes-  Sclera clear, conjunctiva pink Ears- hearing intact Oropharynx- clear Neck- supple,  Lungs- Clear to ausculation bilaterally, normal work of breathing Heart- Regular rate and rhythm, no murmurs, rubs or gallops, PMI not laterally displaced GI- soft, NT, ND, + BS Extremities- no clubbing, cyanosis, or edema MS- no significant deformity or atrophy Skin- no rash or lesion Psych- euthymic mood, full affect Neuro- strength and sensation are intact  ekg today reveals sinus rhythm  70 bpm, PR 192, QTc 473, otherwise normal ekg   Assessment and Plan: 1. Afib Maintaining NSR  on amiodarone but  will  stop due to pt's wishes to see if she will maintain SR of of it, for fear of long term side effects. Continue anticoagulation  2. Obesity Discussed weight loss today to minimize reoccurrence of afib. Is going to set up nutrition appointment at her retirement facility. Exercise difficult for pt due to back issues but does water exercise 4x week.  3.HTN Stable  No change required today   4. Chronic Anemia  Improved and stable with last hgb of 11.0 ( compared to prior 8.6)  5. Off NOAC since Sunday for epidural injection. Restart when safe as directed following procedure.  6. F/u afib clinic in 3 months.  I have seen, examined the patient, and reviewed the above assessment and plan with [name] NP.  Changes to above are made where necessary.  Will try stopping amiodarone per pts request.  She will contact my office should she have AF recurrence.  We will need to decide whether to restart amiodarone, try another agent, or consider ablation at that time.  Weight loss is essential to maintaining sinus rhythm long term.  Co Sign: [name], MD 06/28/2014 11:14 PM

## 2014-07-12 ENCOUNTER — Other Ambulatory Visit: Payer: Self-pay | Admitting: Internal Medicine

## 2014-07-12 ENCOUNTER — Other Ambulatory Visit: Payer: Self-pay | Admitting: Family Medicine

## 2014-07-30 ENCOUNTER — Other Ambulatory Visit: Payer: Self-pay | Admitting: Family Medicine

## 2014-08-06 ENCOUNTER — Telehealth: Payer: Self-pay | Admitting: Family Medicine

## 2014-08-06 NOTE — Telephone Encounter (Signed)
Error/gd °

## 2014-08-13 ENCOUNTER — Ambulatory Visit: Payer: Medicare Other | Admitting: Family Medicine

## 2014-08-23 ENCOUNTER — Ambulatory Visit (INDEPENDENT_AMBULATORY_CARE_PROVIDER_SITE_OTHER): Payer: Medicare Other | Admitting: Family Medicine

## 2014-08-23 ENCOUNTER — Encounter: Payer: Self-pay | Admitting: Family Medicine

## 2014-08-23 VITALS — BP 139/86 | HR 74 | Temp 98.7°F | Ht 63.5 in | Wt 259.6 lb

## 2014-08-23 DIAGNOSIS — E785 Hyperlipidemia, unspecified: Secondary | ICD-10-CM

## 2014-08-23 DIAGNOSIS — K5900 Constipation, unspecified: Secondary | ICD-10-CM

## 2014-08-23 DIAGNOSIS — D649 Anemia, unspecified: Secondary | ICD-10-CM

## 2014-08-23 DIAGNOSIS — I10 Essential (primary) hypertension: Secondary | ICD-10-CM

## 2014-08-23 DIAGNOSIS — M48061 Spinal stenosis, lumbar region without neurogenic claudication: Secondary | ICD-10-CM

## 2014-08-23 DIAGNOSIS — M4806 Spinal stenosis, lumbar region: Secondary | ICD-10-CM

## 2014-08-23 DIAGNOSIS — I4891 Unspecified atrial fibrillation: Secondary | ICD-10-CM

## 2014-08-23 DIAGNOSIS — E669 Obesity, unspecified: Secondary | ICD-10-CM

## 2014-08-23 LAB — HEPATIC FUNCTION PANEL
ALBUMIN: 4.4 g/dL (ref 3.5–5.2)
ALT: 10 U/L (ref 0–35)
AST: 17 U/L (ref 0–37)
Alkaline Phosphatase: 78 U/L (ref 39–117)
BILIRUBIN TOTAL: 0.7 mg/dL (ref 0.2–1.2)
Bilirubin, Direct: 0.1 mg/dL (ref 0.0–0.3)
Indirect Bilirubin: 0.6 mg/dL (ref 0.2–1.2)
Total Protein: 6.8 g/dL (ref 6.0–8.3)

## 2014-08-23 LAB — RENAL FUNCTION PANEL
Albumin: 4.4 g/dL (ref 3.5–5.2)
BUN: 19 mg/dL (ref 6–23)
CO2: 29 mEq/L (ref 19–32)
Calcium: 9.5 mg/dL (ref 8.4–10.5)
Chloride: 102 mEq/L (ref 96–112)
Creat: 1.03 mg/dL (ref 0.50–1.10)
GLUCOSE: 91 mg/dL (ref 70–99)
POTASSIUM: 4 meq/L (ref 3.5–5.3)
Phosphorus: 3.7 mg/dL (ref 2.3–4.6)
Sodium: 142 mEq/L (ref 135–145)

## 2014-08-23 LAB — CBC
HEMATOCRIT: 38.6 % (ref 36.0–46.0)
HEMOGLOBIN: 12.8 g/dL (ref 12.0–15.0)
MCH: 31.2 pg (ref 26.0–34.0)
MCHC: 33.2 g/dL (ref 30.0–36.0)
MCV: 94.1 fL (ref 78.0–100.0)
MPV: 9.7 fL (ref 9.4–12.4)
Platelets: 268 10*3/uL (ref 150–400)
RBC: 4.1 MIL/uL (ref 3.87–5.11)
RDW: 12.8 % (ref 11.5–15.5)
WBC: 6 10*3/uL (ref 4.0–10.5)

## 2014-08-23 LAB — LIPID PANEL
CHOLESTEROL: 172 mg/dL (ref 0–200)
HDL: 51 mg/dL (ref >39)
LDL Cholesterol: 100 mg/dL — ABNORMAL HIGH (ref 0–99)
Total CHOL/HDL Ratio: 3.4 Ratio
Triglycerides: 105 mg/dL (ref <150)
VLDL: 21 mg/dL (ref 0–40)

## 2014-08-23 LAB — TSH: TSH: 4.168 u[IU]/mL (ref 0.350–4.500)

## 2014-08-23 MED ORDER — BUMETANIDE 1 MG PO TABS: 1.0000 mg | ORAL_TABLET | Freq: Every day | ORAL | Status: DC

## 2014-08-23 MED ORDER — LISINOPRIL 10 MG PO TABS: 10.0000 mg | ORAL_TABLET | Freq: Every day | ORAL | Status: DC

## 2014-08-23 MED ORDER — ATORVASTATIN CALCIUM 20 MG PO TABS: 20.0000 mg | ORAL_TABLET | Freq: Every day | ORAL | Status: DC

## 2014-08-23 NOTE — Progress Notes (Signed)
Tracy Miles  [id number] [birth year] 08/23/2014      Progress Note-Follow Up  Subjective  Chief Complaint  Chief Complaint  Patient presents with  . Follow-up    6 mos    HPI  Patient is a 80 y.o. female in today for routine medical care. She continues to struggle with and tries to stay as active as she can. Has radicular symptoms most notably on the right. No incontinence. Is presently following with [facility]. No recent illness. She was able to retired this past September. Denies CP/palp/SOB/HA/congestion/fevers/GI or GU c/o. Taking meds as prescribed  Past Medical History  Diagnosis Date  . Hypertension   . Heart murmur   . Coronary artery disease   . Atrial fibrillation   . Anemia   . Arthritis   . Colon polyp   . Personal history of DVT (deep vein thrombosis)     x 2  . Hyperlipidemia   . Obesity   . Pancreatitis     post hysterectomy  . Sleep apnea   . Aortic stenosis     Very mild  . Hypercholesteremia   . OSA on CPAP   . PAF (paroxysmal atrial fibrillation)   . History of deep vein thrombophlebitis of lower extremity   . History of valvular heart disease     LEFT  . Chicken pox as a child  . Measles as a child  . Mumps child and teenager  . Allergy   . Obesity   . Unspecified constipation 11/16/2013  . Heme positive stool 11/16/2013  . DDD (degenerative disc disease) 11/16/2013  . History of DVT (deep vein thrombosis) 11/19/2013  . Unspecified sleep apnea 11/19/2013  . Allergic state 11/19/2013  . OA (osteoarthritis) 11/19/2013    S/p L TKR  . Benign paroxysmal positional vertigo 12/17/2013  . Abnormal results of thyroid function studies 12/17/2013  . Iron deficiency anemia 11/13/2013  . Personal history of colonic polyps 02/25/2014  . Spinal stenosis     Past Surgical History  Procedure Laterality Date  . Tonsilectomy, adenoidectomy, bilateral myringotomy and tubes  child  . Laminectomy  42 yrs ago  . Tubal ligation  80 years old  .  Total abdominal hysterectomy  1995    had 2 tumors- benign  . Replacement total knee Left 2013  . Cyst removal hand Bilateral 20 yrs ago  . Meniscus repair Bilateral 2000 and 2010  . Appendectomy      Family History  Problem Relation Age of Onset  . Heart attack Mother 37  . Hyperlipidemia Mother     ?  Marland Kitchen Dementia Mother   . Pernicious anemia Mother   . COPD Father     smoker  . Cancer Father 27    prostate  . Heart disease Brother     quadruple bipass surgery  . Hyperlipidemia Brother   . Hypertension Brother   . Diabetes Maternal Grandmother     type 2  . Pernicious anemia Maternal Grandmother   . Colon cancer Neg Hx   . Pancreatic cancer Neg Hx   . Stomach cancer Neg Hx   . Throat cancer Neg Hx   . Liver disease Neg Hx   . Gout Son   . Atrial fibrillation Son   . Hyperlipidemia Son   . Cancer Maternal Grandfather     liver  . Cancer Paternal Grandmother     lung- doesn't think she smokes?  . Stroke Paternal Grandfather   . Cancer Son 64  non hodgin's lymphoma  . Gout Son   . Hyperlipidemia Son   . Sleep apnea Son     History   Social History  . Marital Status: Widowed    Spouse Name: N/A    Number of Children: 3  . Years of Education: N/A   Occupational History  . retired Pharmacist, hospital    Social History Main Topics  . Smoking status: Never Smoker   . Smokeless tobacco: Never Used     Comment: never used tobacco  . Alcohol Use: No  . Drug Use: No  . Sexual Activity: No     Comment: lives at [place], low sodium diet   Other Topics Concern  . Not on file   Social History Narrative    Current Outpatient Prescriptions on File Prior to Visit  Medication Sig Dispense Refill  . atorvastatin (LIPITOR) 20 MG tablet Take 1 tablet (20 mg total) by mouth daily. 90 tablet 1  . bumetanide (BUMEX) 1 MG tablet Take 1 tablet (1 mg total) by mouth daily. 90 tablet 1  . calcium carbonate (OS-CAL) 600 MG TABS tablet Take 600 mg by mouth 2 (two) times daily  with a meal.    . ELIQUIS 5 MG TABS tablet TAKE 1 TABLET (5 MG TOTAL) BY MOUTH 2 (TWO) TIMES DAILY. 180 tablet 1  . KLOR-CON M10 10 MEQ tablet TAKE 1 TABLET (10 MEQ TOTAL) BY MOUTH DAILY. 90 tablet 1  . lisinopril (PRINIVIL,ZESTRIL) 10 MG tablet Take 1 tablet (10 mg total) by mouth daily. 90 tablet 1  . loratadine (CLARITIN) 10 MG tablet Take 10 mg by mouth daily as needed.     . psyllium (REGULOID) 0.52 G capsule Take 0.52 g by mouth 2 (two) times daily.    . verapamil (CALAN-SR) 180 MG CR tablet TAKE 1 TABLET (180 MG TOTAL) BY MOUTH AT BEDTIME. 90 tablet 1   No current facility-administered medications on file prior to visit.    Allergies  Allergen Reactions  . Gabapentin Swelling  . Sulfa Antibiotics   . Zebeta [Bisoprolol Fumarate]   . Penicillins Rash    Review of Systems  Review of Systems  Constitutional: Negative for fever and malaise/fatigue.  HENT: Negative for congestion.   Eyes: Negative for discharge.  Respiratory: Negative for shortness of breath.   Cardiovascular: Negative for chest pain, palpitations and leg swelling.  Gastrointestinal: Negative for nausea, abdominal pain and diarrhea.  Genitourinary: Negative for dysuria.  Musculoskeletal: Positive for back pain. Negative for falls.  Skin: Negative for rash.  Neurological: Negative for loss of consciousness and headaches.  Endo/Heme/Allergies: Negative for polydipsia.  Psychiatric/Behavioral: Negative for depression and suicidal ideas. The patient is not nervous/anxious and does not have insomnia.     Objective  BP 151/62 mmHg  Pulse 74  Temp(Src) 98.7 F (37.1 C) (Oral)  Ht 5' 3.5" (1.613 m)  Wt 259 lb 9.6 oz (117.754 kg)  BMI 45.26 kg/m2  SpO2 96%  Physical Exam  Physical Exam  Lab Results  Component Value Date   TSH 6.31* 10/04/2013   Lab Results  Component Value Date   WBC 10.6* 03/26/2014   HGB 12.1 03/26/2014   HCT 34.5* 03/26/2014   MCV 92.0 03/26/2014   PLT 231 03/26/2014   Lab  Results  Component Value Date   CREATININE 1.02 11/16/2013   BUN 18 11/16/2013   NA 141 11/16/2013   K 4.7 11/16/2013   CL 104 11/16/2013   CO2 29 11/16/2013   Lab  Results  Component Value Date   ALT 21 11/16/2013   AST 21 11/16/2013   ALKPHOS 82 11/16/2013   BILITOT 0.6 11/16/2013   Lab Results  Component Value Date   CHOL 142 11/16/2013   Lab Results  Component Value Date   HDL 51 11/16/2013   Lab Results  Component Value Date   LDLCALC 73 11/16/2013   Lab Results  Component Value Date   TRIG 90 11/16/2013   Lab Results  Component Value Date   CHOLHDL 2.8 11/16/2013     Assessment & Plan  Essential hypertension Improved on recheck. Well controlled, no changes to meds. Encouraged heart healthy diet such as the DASH diet and exercise as tolerated.   Constipation Encouraged increased hydration and fiber in diet. Daily probiotics. If bowels not moving can use MOM 2 tbls po in 4 oz of warm prune juice by mouth every 2-3 days.   Spinal stenosis of lumbar region Continues to try and exercise walks in a pool 4 x a week but her pain is persistent. She has had some epidural shots with Dr Mina Marble and Dr Jolayne Haines with [facility] with temporary relief. Encouraged to stay as active as tolerated.  Obesity Encouraged DASH diet, decrease po intake and increase exercise as tolerated. Needs 7-8 hours of sleep nightly. Avoid trans fats, eat small, frequent meals every 4-5 hours with lean proteins, complex carbs and healthy fats. Minimize simple carbs  Atrial fibrillation Rate controlled today, following with Dr Rayann Heman.

## 2014-08-23 NOTE — Progress Notes (Signed)
Pre visit review using our clinic review tool, if applicable. No additional management support is needed unless otherwise documented below in the visit note. 

## 2014-08-23 NOTE — Patient Instructions (Addendum)
Consider Tylenol/Acetaminophen 500 mg tabs 2 tabs twice a day  Salon Pas patches twice a day  Back Pain, Adult Low back pain is very common. About 1 in 5 people have back pain.The cause of low back pain is rarely dangerous. The pain often gets better over time.About half of people with a sudden onset of back pain feel better in just 2 weeks. About 8 in 10 people feel better by 6 weeks.  CAUSES Some common causes of back pain include:  Strain of the muscles or ligaments supporting the spine.  Wear and tear (degeneration) of the spinal discs.  Arthritis.  Direct injury to the back. DIAGNOSIS Most of the time, the direct cause of low back pain is not known.However, back pain can be treated effectively even when the exact cause of the pain is unknown.Answering your caregiver's questions about your overall health and symptoms is one of the most accurate ways to make sure the cause of your pain is not dangerous. If your caregiver needs more information, he or she may order lab work or imaging tests (X-rays or MRIs).However, even if imaging tests show changes in your back, this usually does not require surgery. HOME CARE INSTRUCTIONS For many people, back pain returns.Since low back pain is rarely dangerous, it is often a condition that people can learn to [facility] their own.   Remain active. It is stressful on the back to sit or stand in one place. Do not sit, drive, or stand in one place for more than 30 minutes at a time. Take short walks on level surfaces as soon as pain allows.Try to increase the length of time you walk each day.  Do not stay in bed.Resting more than 1 or 2 days can delay your recovery.  Do not avoid exercise or work.Your body is made to move.It is not dangerous to be active, even though your back may hurt.Your back will likely heal faster if you return to being active before your pain is gone.  Pay attention to your body when you bend and lift. Many people have  less discomfortwhen lifting if they bend their knees, keep the load close to their bodies,and avoid twisting. Often, the most comfortable positions are those that put less stress on your recovering back.  Find a comfortable position to sleep. Use a firm mattress and lie on your side with your knees slightly bent. If you lie on your back, put a pillow under your knees.  Only take over-the-counter or prescription medicines as directed by your caregiver. Over-the-counter medicines to reduce pain and inflammation are often the most helpful.Your caregiver may prescribe muscle relaxant drugs.These medicines help dull your pain so you can more quickly return to your normal activities and healthy exercise.  Put ice on the injured area.  Put ice in a plastic bag.  Place a towel between your skin and the bag.  Leave the ice on for 15-20 minutes, 03-04 times a day for the first 2 to 3 days. After that, ice and heat may be alternated to reduce pain and spasms.  Ask your caregiver about trying back exercises and gentle massage. This may be of some benefit.  Avoid feeling anxious or stressed.Stress increases muscle tension and can worsen back pain.It is important to recognize when you are anxious or stressed and learn ways to manage it.Exercise is a great option. SEEK MEDICAL CARE IF:  You have pain that is not relieved with rest or medicine.  You have pain that does  not improve in 1 week.  You have new symptoms.  You are generally not feeling well. SEEK IMMEDIATE MEDICAL CARE IF:   You have pain that radiates from your back into your legs.  You develop new bowel or bladder control problems.  You have unusual weakness or numbness in your arms or legs.  You develop nausea or vomiting.  You develop abdominal pain.  You feel faint. Document Released: 08/17/2005 Document Revised: 02/16/2012 Document Reviewed: 12/19/2013 [facility] Patient Information 2015 [place], [place]. This information  is not intended to replace advice given to you by your health care provider. Make sure you discuss any questions you have with your health care provider.

## 2014-08-26 ENCOUNTER — Encounter: Payer: Self-pay | Admitting: Family Medicine

## 2014-08-26 NOTE — Assessment & Plan Note (Signed)
Encouraged DASH diet, decrease po intake and increase exercise as tolerated. Needs 7-8 hours of sleep nightly. Avoid trans fats, eat small, frequent meals every 4-5 hours with lean proteins, complex carbs and healthy fats. Minimize simple carbs 

## 2014-08-26 NOTE — Assessment & Plan Note (Signed)
Improved on recheck. Well controlled, no changes to meds. Encouraged heart healthy diet such as the DASH diet and exercise as tolerated.  

## 2014-08-26 NOTE — Assessment & Plan Note (Signed)
Encouraged increased hydration and fiber in diet. Daily probiotics. If bowels not moving can use MOM 2 tbls po in 4 oz of warm prune juice by mouth every 2-3 days.  

## 2014-08-26 NOTE — Assessment & Plan Note (Signed)
Rate controlled today, following with Dr Rayann Heman.

## 2014-08-26 NOTE — Assessment & Plan Note (Signed)
Continues to try and exercise walks in a pool 4 x a week but her pain is persistent. She has had some epidural shots with Dr Mina Marble and Dr Jolayne Haines with [facility] with temporary relief. Encouraged to stay as active as tolerated.

## 2014-08-31 HISTORY — PX: CATARACT EXTRACTION W/ INTRAOCULAR LENS  IMPLANT, BILATERAL: SHX1307

## 2014-09-04 DIAGNOSIS — H524 Presbyopia: Secondary | ICD-10-CM | POA: Diagnosis not present

## 2014-09-04 DIAGNOSIS — H1851 Endothelial corneal dystrophy: Secondary | ICD-10-CM | POA: Diagnosis not present

## 2014-09-04 DIAGNOSIS — H2513 Age-related nuclear cataract, bilateral: Secondary | ICD-10-CM | POA: Diagnosis not present

## 2014-09-07 ENCOUNTER — Encounter (HOSPITAL_COMMUNITY): Payer: Self-pay | Admitting: Nurse Practitioner

## 2014-09-07 ENCOUNTER — Ambulatory Visit (HOSPITAL_COMMUNITY)
Admission: RE | Admit: 2014-09-07 | Discharge: 2014-09-07 | Disposition: A | Discharge status: Home / Self Care | Payer: Medicare Other | Source: Ambulatory Visit | Attending: Nurse Practitioner | Admitting: Nurse Practitioner

## 2014-09-07 DIAGNOSIS — E669 Obesity, unspecified: Secondary | ICD-10-CM | POA: Diagnosis not present

## 2014-09-07 DIAGNOSIS — Z6841 Body Mass Index (BMI) 40.0 and over, adult: Secondary | ICD-10-CM | POA: Diagnosis not present

## 2014-09-07 DIAGNOSIS — I4891 Unspecified atrial fibrillation: Secondary | ICD-10-CM | POA: Insufficient documentation

## 2014-09-07 DIAGNOSIS — I10 Essential (primary) hypertension: Secondary | ICD-10-CM | POA: Insufficient documentation

## 2014-09-07 NOTE — Patient Instructions (Signed)
Your physician recommends that you continue on your current medications as directed. Please refer to the Current Medication list given to you today.  Your physician recommends that you schedule a follow-up appointment with your primary physician every 6 months  and follow up with cardiology as needed.

## 2014-09-07 NOTE — Progress Notes (Signed)
PCP:  [name], MD Primary Cardiologist:  Dr Meda Coffee  The patient presents today for routine electrophysiology followup.  On last visit, she wished to stop amiodarone for fear of side effects. She has been off x 6 months and is unaware of any issues. Ekg shows SR today. 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 She states she does not ever want to [place] that drug again. Had labs with primary in December, reviewed and WNL.  Today, she denies symptoms of palpitations, chest pain,   orthopnea, PND, lower extremity edema, dizziness, presyncope, syncope, or neurologic sequela. Has chronic back pain that she used to have spinal injections but they have not been helpful and she will no longer get them. The patient feels that she is tolerating medications without difficulties and is otherwise without complaint today.   Past Medical History  Diagnosis Date  . Hypertension   . Heart murmur   . Coronary artery disease   . Atrial fibrillation   . Anemia   . Arthritis   . Colon polyp   . Personal history of DVT (deep vein thrombosis)     x 2  . Hyperlipidemia   . Obesity   . Pancreatitis     post hysterectomy  . Sleep apnea   . Aortic stenosis     Very mild  . Hypercholesteremia   . OSA on CPAP   . PAF (paroxysmal atrial fibrillation)   . History of deep vein thrombophlebitis of lower extremity   . History of valvular heart disease     LEFT  . Chicken pox as a child  . Measles as a child  . Mumps child and teenager  . Allergy   . Obesity   . Unspecified constipation 11/16/2013  . Heme positive stool 11/16/2013  . DDD (degenerative disc disease) 11/16/2013  . History of DVT (deep vein thrombosis) 11/19/2013  . Unspecified sleep apnea 11/19/2013  . Allergic state 11/19/2013  . OA (osteoarthritis) 11/19/2013    S/p L TKR  . Benign paroxysmal positional vertigo 12/17/2013  . Abnormal results of  thyroid function studies 12/17/2013  . Iron deficiency anemia 11/13/2013  . Personal history of colonic polyps 02/25/2014  . Spinal stenosis    Past Surgical History  Procedure Laterality Date  . Tonsilectomy, adenoidectomy, bilateral myringotomy and tubes  child  . Laminectomy  42 yrs ago  . Tubal ligation  81 years old  . Total abdominal hysterectomy  1995    had 2 tumors- benign  . Replacement total knee Left 2013  . Cyst removal hand Bilateral 20 yrs ago  . Meniscus repair Bilateral 2000 and 2010  . Appendectomy      Current Outpatient Prescriptions  Medication Sig Dispense Refill  . atorvastatin (LIPITOR) 20 MG tablet Take 1 tablet (20 mg total) by mouth daily. 90 tablet 1  . bumetanide (BUMEX) 1 MG tablet Take 1 tablet (1 mg total) by mouth daily. 90 tablet 1  . calcium carbonate (OS-CAL) 600 MG TABS tablet Take 600 mg by mouth 2 (two) times daily with a meal.    . ELIQUIS 5 MG TABS tablet TAKE 1 TABLET (5 MG TOTAL) BY MOUTH 2 (TWO) TIMES DAILY. 180 tablet 1  . KLOR-CON M10 10 MEQ tablet TAKE 1 TABLET (10 MEQ TOTAL) BY MOUTH DAILY. 90 tablet 1  . lisinopril (PRINIVIL,ZESTRIL) 10 MG tablet Take 1  tablet (10 mg total) by mouth daily. 90 tablet 1  . loratadine (CLARITIN) 10 MG tablet Take 10 mg by mouth daily as needed.     . psyllium (REGULOID) 0.52 G capsule Take 0.52 g by mouth 2 (two) times daily.    . traMADol (ULTRAM) 50 MG tablet Take 1 tablet by mouth daily.  0  . verapamil (CALAN-SR) 180 MG CR tablet TAKE 1 TABLET (180 MG TOTAL) BY MOUTH AT BEDTIME. 90 tablet 1   No current facility-administered medications for this encounter.    Allergies  Allergen Reactions  . Gabapentin Swelling  . Sulfa Antibiotics   . Zebeta [Bisoprolol Fumarate]   . Penicillins Rash    History   Social History  . Marital Status: Widowed    Spouse Name: N/A    Number of Children: 3  . Years of Education: N/A   Occupational History  . retired Pharmacist, hospital    Social History Main Topics  .  Smoking status: Never Smoker   . Smokeless tobacco: Never Used     Comment: never used tobacco  . Alcohol Use: No  . Drug Use: No  . Sexual Activity: No     Comment: lives at [place], low sodium diet   Other Topics Concern  . Not on file   Social History Narrative    Family History  Problem Relation Age of Onset  . Heart attack Mother 31  . Hyperlipidemia Mother     ?  Marland Kitchen Dementia Mother   . Pernicious anemia Mother   . COPD Father     smoker  . Cancer Father 4    prostate  . Heart disease Brother     quadruple bipass surgery  . Hyperlipidemia Brother   . Hypertension Brother   . Diabetes Maternal Grandmother     type 2  . Pernicious anemia Maternal Grandmother   . Colon cancer Neg Hx   . Pancreatic cancer Neg Hx   . Stomach cancer Neg Hx   . Throat cancer Neg Hx   . Liver disease Neg Hx   . Gout Son   . Atrial fibrillation Son   . Hyperlipidemia Son   . Cancer Maternal Grandfather     liver  . Cancer Paternal Grandmother     lung- doesn't think she smokes?  . Stroke Paternal Grandfather   . Cancer Son 50    non hodgin's lymphoma  . Gout Son   . Hyperlipidemia Son   . Sleep apnea Son     ROS- systems are reviewed and are negative  Per HPI  Physical Exam: Filed Vitals:   09/07/14 1254 09/07/14 1302  BP: 180/82 152/78  Pulse: 76   Resp: 14   Height: 5\' 4"  (1.626 m)   Weight: 261 lb 6.4 oz (118.57 kg)   SpO2: 95%     GEN- The patient is overweight appearing, alert and oriented x 3 today.   Head- normocephalic, atraumatic Eyes-  Sclera clear, conjunctiva pink Ears- hearing intact Oropharynx- clear Neck- supple,  Lungs- Clear to ausculation bilaterally, normal work of breathing Heart- Regular rate and rhythm, no murmurs, rubs or gallops, PMI not laterally displaced GI- soft, NT, ND, + BS Extremities- no clubbing, cyanosis, or edema MS- no significant deformity or atrophy Skin- no rash or lesion Psych- euthymic mood, full affect Neuro-  strength and sensation are intact  ekg today reveals sinus rhythm 69 bpm, Labs Sodium 135 - 145 mEq/L 142   Potassium 3.5 - 5.3  mEq/L 4.0   Chloride 96 - 112 mEq/L 102   CO2 19 - 32 mEq/L 29   Glucose, Bld 70 - 99 mg/dL 91   BUN 6 - 23 mg/dL 19   Creat 0.50 - 1.10 mg/dL 1.03   Albumin 3.5 - 5.2 g/dL 4.4   Calcium 8.4 - 10.5 mg/dL 9.5   Phosphorus 2.3 - 4.6 mg/dL 3.7    WBC 4.0 - 10.5 K/uL 6.0   RBC 3.87 - 5.11 MIL/uL 4.10   Hemoglobin 12.0 - 15.0 g/dL 12.8   HCT 36.0 - 46.0 % 38.6   MCV 78.0 - 100.0 fL 94.1   MCH 26.0 - 34.0 pg 31.2   MCHC 30.0 - 36.0 g/dL 33.2   RDW 11.5 - 15.5 % 12.8   Platelets 150 - 400 K/uL 268   MPV 9.4 - 12.4 fL 9.7    Total Bilirubin 0.2 - 1.2 mg/dL 0.7   Bilirubin, Direct 0.0 - 0.3 mg/dL 0.1   Indirect Bilirubin 0.2 - 1.2 mg/dL 0.6   Alkaline Phosphatase 39 - 117 U/L 78   AST 0 - 37 U/L 17   ALT 0 - 35 U/L 10   Total Protein 6.0 - 8.3 g/dL 6.8   Albumin 3.5 - 5.2 g/dL 4.4       Assessment and Plan: 1. Afib Maintaining NSR off amiodarone, feels well. Continue anticoagulation.  2. Obesity Lifestyle modification.  3.HTN Mildly elevated. No change required today, weight loss.decrease salt intake.   Pt wished to follow with PCP every six months and EP as needed.  I  Co SignRoderic Palau, MD 09/07/2014 1:05 PM

## 2014-09-08 NOTE — Addendum Note (Signed)
Encounter addended by: Vanessa Barbara, CCT on: 09/08/2014  1:11 PM<BR>     Documentation filed: Charges VN

## 2014-09-14 DIAGNOSIS — H2512 Age-related nuclear cataract, left eye: Secondary | ICD-10-CM | POA: Diagnosis not present

## 2014-09-18 ENCOUNTER — Telehealth: Payer: Self-pay | Admitting: Internal Medicine

## 2014-09-18 NOTE — Telephone Encounter (Signed)
New Msg         Pt calling, needs prior auth for medication Eliquis.   Please call [phone] for [facility] [employer] to approve pt prescription.

## 2014-09-19 NOTE — Telephone Encounter (Signed)
PA # [id number] is approved through 09/20/15  Called patient and she is aware

## 2014-10-01 DIAGNOSIS — H2512 Age-related nuclear cataract, left eye: Secondary | ICD-10-CM | POA: Diagnosis not present

## 2014-10-01 DIAGNOSIS — H25812 Combined forms of age-related cataract, left eye: Secondary | ICD-10-CM | POA: Diagnosis not present

## 2014-10-09 DIAGNOSIS — H2511 Age-related nuclear cataract, right eye: Secondary | ICD-10-CM | POA: Diagnosis not present

## 2014-10-15 DIAGNOSIS — H25811 Combined forms of age-related cataract, right eye: Secondary | ICD-10-CM | POA: Diagnosis not present

## 2014-10-15 DIAGNOSIS — H2511 Age-related nuclear cataract, right eye: Secondary | ICD-10-CM | POA: Diagnosis not present

## 2014-10-25 ENCOUNTER — Other Ambulatory Visit: Payer: Self-pay | Admitting: Family Medicine

## 2014-11-14 ENCOUNTER — Ambulatory Visit (INDEPENDENT_AMBULATORY_CARE_PROVIDER_SITE_OTHER): Payer: Medicare Other | Admitting: Adult Health

## 2014-11-14 ENCOUNTER — Encounter: Payer: Self-pay | Admitting: Adult Health

## 2014-11-14 VITALS — BP 120/74 | HR 67 | Temp 98.0°F | Resp 16 | Ht 63.5 in | Wt 263.6 lb

## 2014-11-14 DIAGNOSIS — R21 Rash and other nonspecific skin eruption: Secondary | ICD-10-CM | POA: Diagnosis not present

## 2014-11-14 LAB — CBC WITH DIFFERENTIAL/PLATELET
BASOS ABS: 0 10*3/uL (ref 0.0–0.1)
Basophils Relative: 0.6 % (ref 0.0–3.0)
EOS ABS: 0.1 10*3/uL (ref 0.0–0.7)
Eosinophils Relative: 2.6 % (ref 0.0–5.0)
HEMATOCRIT: 37.3 % (ref 36.0–46.0)
HEMOGLOBIN: 12.8 g/dL (ref 12.0–15.0)
Lymphocytes Relative: 16.5 % (ref 12.0–46.0)
Lymphs Abs: 0.9 10*3/uL (ref 0.7–4.0)
MCHC: 34.3 g/dL (ref 30.0–36.0)
MCV: 92 fl (ref 78.0–100.0)
MONO ABS: 0.3 10*3/uL (ref 0.1–1.0)
MONOS PCT: 6.5 % (ref 3.0–12.0)
Neutro Abs: 3.8 10*3/uL (ref 1.4–7.7)
Neutrophils Relative %: 73.8 % (ref 43.0–77.0)
PLATELETS: 224 10*3/uL (ref 150.0–400.0)
RBC: 4.06 Mil/uL (ref 3.87–5.11)
RDW: 13.8 % (ref 11.5–15.5)
WBC: 5.2 10*3/uL (ref 4.0–10.5)

## 2014-11-14 MED ORDER — CLOTRIMAZOLE-BETAMETHASONE 1-0.05 % EX CREA: 1.0000 "application " | TOPICAL_CREAM | Freq: Two times a day (BID) | CUTANEOUS | Status: DC

## 2014-11-14 NOTE — Progress Notes (Signed)
Pre visit review using our clinic review tool, if applicable. No additional management support is needed unless otherwise documented below in the visit note. 

## 2014-11-14 NOTE — Assessment & Plan Note (Addendum)
Rash of unknown cause.  - Obtain CBC to check platlet count for petechiae rash on upper extremities - Obtain ANA to check for any autoimmune disorders - Prescribed Lotrisone cream for patient to apply BID to lesions. - Follow up with PCP in two weeks to reevaluate rash/lesion.  - If no improvement - refer to dermatology - Will follow-up on labs.

## 2014-11-14 NOTE — Patient Instructions (Addendum)
I have sent a antifungal/steroid creme to your pharmacy. Please apply it twice a day to the spots on your legs. Please follow-up with Dr. Charlett Blake in two weeks to reevaluate this rash and for possible referral to dermatology. You can continue your normal activities...enjoy the pool! It was a pleasure meeting you and I hope your rash improves quickly.

## 2014-11-14 NOTE — Progress Notes (Signed)
Subjective:    Patient ID: Tracy Miles, female    DOB: 27-Oct-1933, 81 y.o.   MRN: [id number]  HPI  Tracy Miles presents to the office today with the complaint of rash on bilateral upper and lower extremities. She endorses first noticing the rash six weeks ago and first appeared on her right lower leg. Since that time the area on the right lower leg has increased in size and the appearance has changed. She also endorses that there is slight pain to the wound on the right lower leg. She does not endorse pain in any other area.   Patient endorses that the rash has spread and that she has multiple areas on her legs and arms.   She has not applied anything to the rash in order to make it go away. She endorses being in the pool at her facility quite often and that someone else that swims with her also has a rash. Unsure if it was a similar rash.   She does not endorse any weeping or erythema from the sites throughout her extremities.    Denies any changes in diet/medications/detergents   Past Medical History  Diagnosis Date  . Hypertension   . Heart murmur   . Coronary artery disease   . Atrial fibrillation   . Anemia   . Arthritis   . Colon polyp   . Personal history of DVT (deep vein thrombosis)     x 2  . Hyperlipidemia   . Obesity   . Pancreatitis     post hysterectomy  . Sleep apnea   . Aortic stenosis     Very mild  . Hypercholesteremia   . OSA on CPAP   . PAF (paroxysmal atrial fibrillation)   . History of deep vein thrombophlebitis of lower extremity   . History of valvular heart disease     LEFT  . Chicken pox as a child  . Measles as a child  . Mumps child and teenager  . Allergy   . Obesity   . Unspecified constipation 11/16/2013  . Heme positive stool 11/16/2013  . DDD (degenerative disc disease) 11/16/2013  . History of DVT (deep vein thrombosis) 11/19/2013  . Unspecified sleep apnea 11/19/2013  . Allergic state 11/19/2013  . OA (osteoarthritis) 11/19/2013   S/p L TKR  . Benign paroxysmal positional vertigo 12/17/2013  . Abnormal results of thyroid function studies 12/17/2013  . Iron deficiency anemia 11/13/2013  . Personal history of colonic polyps 02/25/2014  . Spinal stenosis       Past Surgical History  Procedure Laterality Date  . Tonsilectomy, adenoidectomy, bilateral myringotomy and tubes  child  . Laminectomy  40 yrs ago  . Tubal ligation  81 years old  . Total abdominal hysterectomy  1995    had 2 tumors- benign  . Replacement total knee Left 2013  . Cyst removal hand Bilateral 20 yrs ago  . Meniscus repair Bilateral 2000 and 2010  . Appendectomy     Allergies  Allergen Reactions  . Gabapentin Swelling  . Sulfa Antibiotics   . Zebeta [Bisoprolol Fumarate]   . Penicillins Rash     BP 120/74 mmHg  Pulse 67  Temp(Src) 98 F (36.7 C) (Oral)  Resp 16  Ht 5' 3.5" (1.613 m)  Wt 263 lb 9.6 oz (119.568 kg)  BMI 45.96 kg/m2  SpO2 99%  Review of Systems  Constitutional: Negative for chills, diaphoresis, activity change, appetite change and fatigue.  Musculoskeletal: Negative for myalgias, joint swelling and arthralgias.  Skin: Positive for rash and wound.  Hematological: Negative.        Objective:   Physical Exam  Constitutional: She appears well-developed and well-nourished.  Cardiovascular: Normal rate.   Pulmonary/Chest: Effort normal. No respiratory distress.  Musculoskeletal: Normal range of motion.  Skin: Skin is warm and dry. Rash noted.  Multiple areas of discoid/raised/scaly/non weeping lesions on lower extremities. Mostly seen on right and left shin area. One lesion on the back of the right upper leg. The largest lesion being on the right shin with two large ( approx 0.5 diamter) discoid lesions which appeared raised/ dry/scaly. This lesion was the one that was painful to the patient.    Also had a diffuse petechiae type rash on bilateral upper extremities  Rosacea noticed on bilateral cheeks and across bridge of nose.   Vitals reviewed.           Assessment & Plan:

## 2014-11-15 LAB — ANA: Anti Nuclear Antibody(ANA): NEGATIVE

## 2014-11-16 ENCOUNTER — Encounter: Payer: Self-pay | Admitting: Family

## 2014-11-16 NOTE — Addendum Note (Signed)
Addended by: Apolinar Junes on: 11/16/2014 07:13 AM   Modules accepted: Level of Service

## 2014-11-16 NOTE — Addendum Note (Signed)
Addended by: Apolinar Junes on: 11/16/2014 07:14 AM   Modules accepted: Level of Service

## 2014-11-22 ENCOUNTER — Other Ambulatory Visit: Payer: Self-pay | Admitting: Family

## 2014-11-24 ENCOUNTER — Other Ambulatory Visit: Payer: Self-pay | Admitting: Family Medicine

## 2014-12-05 ENCOUNTER — Encounter: Payer: Self-pay | Admitting: Family

## 2014-12-05 ENCOUNTER — Ambulatory Visit (INDEPENDENT_AMBULATORY_CARE_PROVIDER_SITE_OTHER): Payer: Medicare Other | Admitting: Family

## 2014-12-05 VITALS — BP 124/82 | HR 71 | Temp 97.8°F | Resp 16 | Ht 63.5 in | Wt 263.6 lb

## 2014-12-05 DIAGNOSIS — R21 Rash and other nonspecific skin eruption: Secondary | ICD-10-CM | POA: Diagnosis not present

## 2014-12-05 DIAGNOSIS — L989 Disorder of the skin and subcutaneous tissue, unspecified: Secondary | ICD-10-CM | POA: Diagnosis not present

## 2014-12-05 NOTE — Assessment & Plan Note (Signed)
No improvement with lotrisone. Advised pt ok to stop lotrisone.  Will refer to dermatology for further evaluation.

## 2014-12-05 NOTE — Progress Notes (Signed)
Pre visit review using our clinic review tool, if applicable. No additional management support is needed unless otherwise documented below in the visit note. 

## 2014-12-05 NOTE — Patient Instructions (Signed)
You will be contacted about your referral to dermatology.  Please let us know if you have not heard back within 1 week about your referral. Please keep your upcoming appointment with Dr. Charlett Blake.

## 2014-12-05 NOTE — Progress Notes (Signed)
Subjective:    Patient ID: Tracy Miles, female    DOB: [birth year], 81 y.o.   MRN: [id number]  HPI  [name] is a 81 yr old female who presents today for follow up of skin rash. She was seen on 11/14/14 by Dorothyann Peng NP who obtained CBC and ANA and rx'd lotrisone cream. ANA and CBC were WNL.     Review of Systems See HPI  Past Medical History  Diagnosis Date  . Hypertension   . Heart murmur   . Coronary artery disease   . Atrial fibrillation   . Anemia   . Arthritis   . Colon polyp   . Personal history of DVT (deep vein thrombosis)     x 2  . Hyperlipidemia   . Obesity   . Pancreatitis     post hysterectomy  . Sleep apnea   . Aortic stenosis     Very mild  . Hypercholesteremia   . OSA on CPAP   . PAF (paroxysmal atrial fibrillation)   . History of deep vein thrombophlebitis of lower extremity   . History of valvular heart disease     LEFT  . Chicken pox as a child  . Measles as a child  . Mumps child and teenager  . Allergy   . Obesity   . Unspecified constipation 11/16/2013  . Heme positive stool 11/16/2013  . DDD (degenerative disc disease) 11/16/2013  . History of DVT (deep vein thrombosis) 11/19/2013  . Unspecified sleep apnea 11/19/2013  . Allergic state 11/19/2013  . OA (osteoarthritis) 11/19/2013    S/p L TKR  . Benign paroxysmal positional vertigo 12/17/2013  . Abnormal results of thyroid function studies 12/17/2013  . Iron deficiency anemia 11/13/2013  . Personal history of colonic polyps 02/25/2014  . Spinal stenosis     History   Social History  . Marital Status: Widowed    Spouse Name: N/A  . Number of Children: 3  . Years of Education: N/A   Occupational History  . retired Pharmacist, hospital    Social History Main Topics  . Smoking status: Never Smoker   . Smokeless tobacco: Never Used     Comment: never used tobacco  . Alcohol Use: No  . Drug Use: No  . Sexual Activity: No     Comment: lives [facility] [place] [facility], low sodium diet   Other Topics  Concern  . Not on file   Social History Narrative    Past Surgical History  Procedure Laterality Date  . Tonsilectomy, adenoidectomy, bilateral myringotomy and tubes  child  . Laminectomy  40 yrs ago  . Tubal ligation  81 years old  . Total abdominal hysterectomy  1995    had 2 tumors- benign  . Replacement total knee Left 2013  . Cyst removal hand Bilateral 20 yrs ago  . Meniscus repair Bilateral 2000 and 2010  . Appendectomy      Family History  Problem Relation Age of Onset  . Heart attack Mother 67  . Hyperlipidemia Mother     ?  Marland Kitchen Dementia Mother   . Pernicious anemia Mother   . COPD Father     smoker  . Cancer Father 39    prostate  . Heart disease Brother     quadruple bipass surgery  . Hyperlipidemia Brother   . Hypertension Brother   . Diabetes Maternal Grandmother     type 2  . Pernicious anemia Maternal Grandmother   . Colon cancer Neg Hx   .  Pancreatic cancer Neg Hx   . Stomach cancer Neg Hx   . Throat cancer Neg Hx   . Liver disease Neg Hx   . Gout Son   . Atrial fibrillation Son   . Hyperlipidemia Son   . Cancer Maternal Grandfather     liver  . Cancer Paternal Grandmother     lung- doesn't think she smokes?  . Stroke Paternal Grandfather   . Cancer Son 50    non hodgin's lymphoma  . Gout Son   . Hyperlipidemia Son   . Sleep apnea Son     Allergies  Allergen Reactions  . Gabapentin Swelling  . Zebeta [Bisoprolol Fumarate] Nausea Only  . Penicillins Rash  . Sulfa Antibiotics Rash    Current Outpatient Prescriptions on File Prior to Visit  Medication Sig Dispense Refill  . atorvastatin (LIPITOR) 20 MG tablet Take 1 tablet (20 mg total) by mouth daily. 90 tablet 1  . bumetanide (BUMEX) 1 MG tablet TAKE ONE (1) TABLET EACH DAY 90 tablet 1  . bumetanide (BUMEX) 1 MG tablet TAKE 1 TABLET (1 MG TOTAL) BY MOUTH DAILY. 90 tablet 0  . calcium carbonate (OS-CAL) 600 MG TABS tablet Take 600 mg by mouth 2 (two) times daily with a meal.    .  clotrimazole-betamethasone (LOTRISONE) cream Apply 1 application topically 2 (two) times daily. Apply to areas of rash on leg 30 g 0  . ELIQUIS 5 MG TABS tablet TAKE 1 TABLET (5 MG TOTAL) BY MOUTH 2 (TWO) TIMES DAILY. 180 tablet 1  . KLOR-CON M10 10 MEQ tablet TAKE 1 TABLET (10 MEQ TOTAL) BY MOUTH DAILY. 90 tablet 1  . lisinopril (PRINIVIL,ZESTRIL) 10 MG tablet Take 1 tablet (10 mg total) by mouth daily. 90 tablet 1  . loratadine (CLARITIN) 10 MG tablet Take 10 mg by mouth daily as needed.     . prednisoLONE acetate (PRED FORTE) 1 % ophthalmic suspension Place 2 drops into both eyes daily.    . psyllium (REGULOID) 0.52 G capsule Take 0.52 g by mouth 2 (two) times daily.    . verapamil (CALAN-SR) 180 MG CR tablet TAKE 1 TABLET (180 MG TOTAL) BY MOUTH AT BEDTIME. 90 tablet 1   No current facility-administered medications on file prior to visit.    BP 124/82 mmHg  Pulse 71  Temp(Src) 97.8 F (36.6 C) (Oral)  Resp 16  Ht 5' 3.5" (1.613 m)  Wt 263 lb 9.6 oz (119.568 kg)  BMI 45.96 kg/m2  SpO2 96%       Objective:   Physical Exam  Constitutional: She is oriented to person, place, and time. She appears well-developed and well-nourished. No distress.  Neurological: She is alert and oriented to person, place, and time.  Skin: Skin is warm and dry.  Multiple raised lesions, erythematous, bilateral legs  Psychiatric: She has a normal mood and affect. Her behavior is normal. Judgment and thought content normal.            Assessment & Plan:

## 2014-12-07 ENCOUNTER — Encounter: Payer: Self-pay | Admitting: Family Medicine

## 2014-12-07 ENCOUNTER — Other Ambulatory Visit: Payer: Self-pay | Admitting: Family Medicine

## 2014-12-13 ENCOUNTER — Telehealth: Payer: Self-pay | Admitting: Internal Medicine

## 2014-12-13 NOTE — Telephone Encounter (Signed)
New message         Office needs to know if pt can stop eliquiis for a oral surgery   please fax over to [phone] attn to nurse

## 2014-12-14 NOTE — Telephone Encounter (Signed)
Reviewed pt's chart.  Has a history of Afib (CHADS score 2) and history of DVT.  No TIA or CVA listed.  Okay to hold Eliquis x 48 hours prior to procedure.  Clearance faxed to surgeon's office.  LMOM for pt with this information as well.

## 2014-12-21 DIAGNOSIS — L300 Nummular dermatitis: Secondary | ICD-10-CM | POA: Diagnosis not present

## 2015-01-14 ENCOUNTER — Other Ambulatory Visit: Payer: Self-pay | Admitting: Family Medicine

## 2015-01-18 DIAGNOSIS — L300 Nummular dermatitis: Secondary | ICD-10-CM | POA: Diagnosis not present

## 2015-02-04 ENCOUNTER — Other Ambulatory Visit: Payer: Self-pay | Admitting: Family Medicine

## 2015-02-14 ENCOUNTER — Other Ambulatory Visit: Payer: Self-pay | Admitting: Internal Medicine

## 2015-02-22 ENCOUNTER — Ambulatory Visit (INDEPENDENT_AMBULATORY_CARE_PROVIDER_SITE_OTHER): Payer: Medicare Other | Admitting: Family Medicine

## 2015-02-22 ENCOUNTER — Encounter: Payer: Self-pay | Admitting: Family Medicine

## 2015-02-22 VITALS — BP 132/88 | HR 88 | Temp 98.2°F | Ht 62.5 in | Wt 262.2 lb

## 2015-02-22 DIAGNOSIS — I10 Essential (primary) hypertension: Secondary | ICD-10-CM | POA: Diagnosis not present

## 2015-02-22 DIAGNOSIS — E785 Hyperlipidemia, unspecified: Secondary | ICD-10-CM

## 2015-02-22 DIAGNOSIS — I4891 Unspecified atrial fibrillation: Secondary | ICD-10-CM

## 2015-02-22 DIAGNOSIS — M1990 Unspecified osteoarthritis, unspecified site: Secondary | ICD-10-CM

## 2015-02-22 DIAGNOSIS — N390 Urinary tract infection, site not specified: Secondary | ICD-10-CM

## 2015-02-22 DIAGNOSIS — G4733 Obstructive sleep apnea (adult) (pediatric): Secondary | ICD-10-CM | POA: Diagnosis not present

## 2015-02-22 DIAGNOSIS — E782 Mixed hyperlipidemia: Secondary | ICD-10-CM

## 2015-02-22 DIAGNOSIS — E669 Obesity, unspecified: Secondary | ICD-10-CM

## 2015-02-22 DIAGNOSIS — G4730 Sleep apnea, unspecified: Secondary | ICD-10-CM | POA: Insufficient documentation

## 2015-02-22 LAB — URINALYSIS
Bilirubin Urine: NEGATIVE
HGB URINE DIPSTICK: NEGATIVE
KETONES UR: NEGATIVE
Leukocytes, UA: NEGATIVE
NITRITE: NEGATIVE
SPECIFIC GRAVITY, URINE: 1.01 (ref 1.000–1.030)
Total Protein, Urine: NEGATIVE
Urine Glucose: NEGATIVE
Urobilinogen, UA: 0.2 (ref 0.0–1.0)
pH: 6.5 (ref 5.0–8.0)

## 2015-02-22 LAB — LIPID PANEL
Cholesterol: 172 mg/dL (ref 0–200)
HDL: 51.8 mg/dL (ref >39.00)
LDL CALC: 91 mg/dL (ref 0–99)
NonHDL: 120.2
TRIGLYCERIDES: 145 mg/dL (ref 0.0–149.0)
Total CHOL/HDL Ratio: 3
VLDL: 29 mg/dL (ref 0.0–40.0)

## 2015-02-22 LAB — COMPREHENSIVE METABOLIC PANEL
ALBUMIN: 4.6 g/dL (ref 3.5–5.2)
ALT: 11 U/L (ref 0–35)
AST: 15 U/L (ref 0–37)
Alkaline Phosphatase: 79 U/L (ref 39–117)
BUN: 21 mg/dL (ref 6–23)
CALCIUM: 10 mg/dL (ref 8.4–10.5)
CHLORIDE: 101 meq/L (ref 96–112)
CO2: 31 meq/L (ref 19–32)
CREATININE: 1.02 mg/dL (ref 0.40–1.20)
GFR: 55.58 mL/min — AB (ref >60.00)
Glucose, Bld: 96 mg/dL (ref 70–99)
POTASSIUM: 4 meq/L (ref 3.5–5.1)
Sodium: 139 mEq/L (ref 135–145)
Total Bilirubin: 0.8 mg/dL (ref 0.2–1.2)
Total Protein: 7.4 g/dL (ref 6.0–8.3)

## 2015-02-22 LAB — CBC
HEMATOCRIT: 41 % (ref 36.0–46.0)
HEMOGLOBIN: 13.7 g/dL (ref 12.0–15.0)
MCHC: 33.4 g/dL (ref 30.0–36.0)
MCV: 94.6 fl (ref 78.0–100.0)
Platelets: 229 10*3/uL (ref 150.0–400.0)
RBC: 4.34 Mil/uL (ref 3.87–5.11)
RDW: 13.2 % (ref 11.5–15.5)
WBC: 6.3 10*3/uL (ref 4.0–10.5)

## 2015-02-22 LAB — TSH: TSH: 1.76 u[IU]/mL (ref 0.35–4.50)

## 2015-02-22 MED ORDER — ATORVASTATIN CALCIUM 20 MG PO TABS: 20.0000 mg | ORAL_TABLET | Freq: Every day | ORAL | Status: DC

## 2015-02-22 MED ORDER — LISINOPRIL 10 MG PO TABS: 10.0000 mg | ORAL_TABLET | Freq: Every day | ORAL | Status: DC

## 2015-02-22 MED ORDER — VERAPAMIL HCL ER 180 MG PO TBCR: 180.0000 mg | EXTENDED_RELEASE_TABLET | Freq: Every day | ORAL | Status: DC

## 2015-02-22 MED ORDER — POTASSIUM CHLORIDE ER 10 MEQ PO TBCR: EXTENDED_RELEASE_TABLET | ORAL | Status: DC

## 2015-02-22 MED ORDER — APIXABAN 5 MG PO TABS: 5.0000 mg | ORAL_TABLET | Freq: Two times a day (BID) | ORAL | Status: DC

## 2015-02-22 NOTE — Patient Instructions (Signed)

## 2015-02-22 NOTE — Assessment & Plan Note (Signed)
Well controlled, no changes to meds. Encouraged heart healthy diet such as the DASH diet and exercise as tolerated.  °

## 2015-02-22 NOTE — Assessment & Plan Note (Signed)
Encouraged DASH diet, decrease po intake and increase exercise as tolerated. Needs 7-8 hours of sleep nightly. Avoid trans fats, eat small, frequent meals every 4-5 hours with lean proteins, complex carbs and healthy fats. Minimize simple carbs, GMO foods. 

## 2015-02-22 NOTE — Progress Notes (Signed)
Pre visit review using our clinic review tool, if applicable. No additional management support is needed unless otherwise documented below in the visit note. 

## 2015-02-22 NOTE — Assessment & Plan Note (Signed)
Encouraged increased hydration and fiber in diet. Daily probiotics. If bowels not moving can use MOM 2 tbls po in 4 oz of warm prune juice by mouth every 2-3 days. If no results then repeat in 4 hours with  Dulcolax suppository pr, may repeat again in 4 more hours as needed. Seek care if symptoms worsen. Consider daily Miralax and/or Dulcolax if symptoms persist.  

## 2015-03-07 ENCOUNTER — Ambulatory Visit (HOSPITAL_BASED_OUTPATIENT_CLINIC_OR_DEPARTMENT_OTHER): Payer: Medicare Other | Attending: Family Medicine | Admitting: Radiology

## 2015-03-07 VITALS — Ht 62.5 in | Wt 262.0 lb

## 2015-03-07 DIAGNOSIS — G4733 Obstructive sleep apnea (adult) (pediatric): Secondary | ICD-10-CM | POA: Diagnosis not present

## 2015-03-07 DIAGNOSIS — I10 Essential (primary) hypertension: Secondary | ICD-10-CM | POA: Diagnosis present

## 2015-03-07 DIAGNOSIS — G4736 Sleep related hypoventilation in conditions classified elsewhere: Secondary | ICD-10-CM | POA: Diagnosis not present

## 2015-03-07 DIAGNOSIS — G4730 Sleep apnea, unspecified: Secondary | ICD-10-CM

## 2015-03-10 ENCOUNTER — Encounter: Payer: Self-pay | Admitting: Family Medicine

## 2015-03-10 NOTE — Assessment & Plan Note (Signed)
Sleep study and pulmonology referral ordered.

## 2015-03-10 NOTE — Assessment & Plan Note (Signed)
Tolerating statin, encouraged heart healthy diet, avoid trans fats, minimize simple carbs and saturated fats. Increase exercise as tolerated 

## 2015-03-10 NOTE — Assessment & Plan Note (Signed)
Using Tylenol bid with some results. Stay as active as possible, topical treatments as needed

## 2015-03-10 NOTE — Progress Notes (Signed)
Tracy Miles  [id number] 04-16-1934 03/10/2015      Progress Note-Follow Up  Subjective  Chief Complaint  Chief Complaint  Patient presents with  . Follow-up    6 month    HPI  Patient is a 81 y.o. female in today for routine medical care. Patient is in today for follow-up on numerous concerns. She is tolerating her Eliquis and she's had no recent cardiac concerns. She was recently treated for a UTI and feels better only some mild urinary frequency at this time. No dysuria. Did have a recent skin infection but anti-fungal also been helpful in the itching has improved. Her persistent right back and right lower extremity pain are stable and responded partially to Tylenol when necessary she continues to struggle with restless sleep and snoring. Has chronic fatigue as a result. No recent illness fevers or chills. Denies CP/palp/SOB/HA/congestion/fevers/GI or GU c/o. Taking meds as prescribed   Past Medical History  Diagnosis Date  . Hypertension   . Heart murmur   . Coronary artery disease   . Atrial fibrillation   . Anemia   . Arthritis   . Colon polyp   . Personal history of DVT (deep vein thrombosis)     x 2  . Hyperlipidemia   . Obesity   . Pancreatitis     post hysterectomy  . Sleep apnea   . Aortic stenosis     Very mild  . Hypercholesteremia   . OSA on CPAP   . PAF (paroxysmal atrial fibrillation)   . History of deep vein thrombophlebitis of lower extremity   . History of valvular heart disease     LEFT  . Chicken pox as a child  . Measles as a child  . Mumps child and teenager  . Allergy   . Obesity   . Unspecified constipation 11/16/2013  . Heme positive stool 11/16/2013  . DDD (degenerative disc disease) 11/16/2013  . History of DVT (deep vein thrombosis) 11/19/2013  . Unspecified sleep apnea 11/19/2013  . Allergic state 11/19/2013  . OA (osteoarthritis) 11/19/2013    S/p L TKR  . Benign paroxysmal positional vertigo 12/17/2013  . Abnormal results of  thyroid function studies 12/17/2013  . Iron deficiency anemia 11/13/2013  . Personal history of colonic polyps 02/25/2014  . Spinal stenosis   . Obstructive sleep apnea 02/22/2015    Past Surgical History  Procedure Laterality Date  . Tonsilectomy, adenoidectomy, bilateral myringotomy and tubes  child  . Laminectomy  40 yrs ago  . Tubal ligation  81 years old  . Total abdominal hysterectomy  1995    had 2 tumors- benign  . Replacement total knee Left 2013  . Cyst removal hand Bilateral 20 yrs ago  . Meniscus repair Bilateral 2000 and 2010  . Appendectomy      Family History  Problem Relation Age of Onset  . Heart attack Mother 54  . Hyperlipidemia Mother     ?  Marland Kitchen Dementia Mother   . Pernicious anemia Mother   . COPD Father     smoker  . Cancer Father 33    prostate  . Heart disease Brother     quadruple bipass surgery  . Hyperlipidemia Brother   . Hypertension Brother   . Diabetes Maternal Grandmother     type 2  . Pernicious anemia Maternal Grandmother   . Colon cancer Neg Hx   . Pancreatic cancer Neg Hx   . Stomach cancer Neg Hx   . Throat  cancer Neg Hx   . Liver disease Neg Hx   . Gout Son   . Atrial fibrillation Son   . Hyperlipidemia Son   . Cancer Maternal Grandfather     liver  . Cancer Paternal Grandmother     lung- doesn't think she smokes?  . Stroke Paternal Grandfather   . Cancer Son 50    non hodgin's lymphoma  . Gout Son   . Hyperlipidemia Son   . Sleep apnea Son     History   Social History  . Marital Status: Widowed    Spouse Name: N/A  . Number of Children: 3  . Years of Education: N/A   Occupational History  . retired Pharmacist, hospital    Social History Main Topics  . Smoking status: Never Smoker   . Smokeless tobacco: Never Used     Comment: never used tobacco  . Alcohol Use: No  . Drug Use: No  . Sexual Activity: No     Comment: lives [place], low sodium diet   Other Topics Concern  . Not on file   Social History Narrative     Current Outpatient Prescriptions on File Prior to Visit  Medication Sig Dispense Refill  . bumetanide (BUMEX) 1 MG tablet TAKE 1 TABLET (1 MG TOTAL) BY MOUTH DAILY. 90 tablet 0  . calcium carbonate (OS-CAL) 600 MG TABS tablet Take 600 mg by mouth 2 (two) times daily with a meal.    . loratadine (CLARITIN) 10 MG tablet Take 10 mg by mouth daily as needed.     . psyllium (REGULOID) 0.52 G capsule Take 0.52 g by mouth 2 (two) times daily.     No current facility-administered medications on file prior to visit.    Allergies  Allergen Reactions  . Gabapentin Swelling  . Zebeta [Bisoprolol Fumarate] Nausea Only  . Penicillins Rash  . Sulfa Antibiotics Rash    Review of Systems  Review of Systems  Constitutional: Negative for fever and malaise/fatigue.  HENT: Negative for congestion.   Eyes: Negative for discharge.  Respiratory: Negative for shortness of breath.   Cardiovascular: Negative for chest pain, palpitations and leg swelling.  Gastrointestinal: Negative for nausea, abdominal pain and diarrhea.  Genitourinary: Negative for dysuria.  Musculoskeletal: Positive for back pain and joint pain. Negative for falls.  Skin: Positive for itching. Negative for rash.  Neurological: Negative for loss of consciousness and headaches.  Endo/Heme/Allergies: Negative for polydipsia.  Psychiatric/Behavioral: Negative for depression and suicidal ideas. The patient is not nervous/anxious and does not have insomnia.     Objective  BP 132/88 mmHg  Pulse 88  Temp(Src) 98.2 F (36.8 C) (Oral)  Ht 5' 2.5" (1.588 m)  Wt 262 lb 4 oz (118.956 kg)  BMI 47.17 kg/m2  SpO2 95%  Physical Exam  Physical Exam  Constitutional: She is oriented to person, place, and time and well-developed, well-nourished, and in no distress. No distress.  HENT:  Head: Normocephalic and atraumatic.  Eyes: Conjunctivae are normal.  Neck: Neck supple. No thyromegaly present.  Cardiovascular: Normal rate, regular  rhythm and normal heart sounds.   No murmur heard. Pulmonary/Chest: Effort normal and breath sounds normal. She has no wheezes.  Abdominal: She exhibits no distension and no mass.  Musculoskeletal: She exhibits no edema.  Lymphadenopathy:    She has no cervical adenopathy.  Neurological: She is alert and oriented to person, place, and time.  Skin: Skin is warm and dry. No rash noted. She is not diaphoretic.  Psychiatric: Memory, affect and judgment normal.    Lab Results  Component Value Date   TSH 1.76 02/22/2015   Lab Results  Component Value Date   WBC 6.3 02/22/2015   HGB 13.7 02/22/2015   HCT 41.0 02/22/2015   MCV 94.6 02/22/2015   PLT 229.0 02/22/2015   Lab Results  Component Value Date   CREATININE 1.02 02/22/2015   BUN 21 02/22/2015   NA 139 02/22/2015   K 4.0 02/22/2015   CL 101 02/22/2015   CO2 31 02/22/2015   Lab Results  Component Value Date   ALT 11 02/22/2015   AST 15 02/22/2015   ALKPHOS 79 02/22/2015   BILITOT 0.8 02/22/2015   Lab Results  Component Value Date   CHOL 172 02/22/2015   Lab Results  Component Value Date   HDL 51.80 02/22/2015   Lab Results  Component Value Date   LDLCALC 91 02/22/2015   Lab Results  Component Value Date   TRIG 145.0 02/22/2015   Lab Results  Component Value Date   CHOLHDL 3 02/22/2015     Assessment & Plan  Essential hypertension Well controlled, no changes to meds. Encouraged heart healthy diet such as the DASH diet and exercise as tolerated.   Constipation Encouraged increased hydration and fiber in diet. Daily probiotics. If bowels not moving can use MOM 2 tbls po in 4 oz of warm prune juice by mouth every 2-3 days. If no results then repeat in 4 hours with  Dulcolax suppository pr, may repeat again in 4 more hours as needed. Seek care if symptoms worsen. Consider daily Miralax and/or Dulcolax if symptoms persist.   Obesity Encouraged DASH diet, decrease po intake and increase exercise as  tolerated. Needs 7-8 hours of sleep nightly. Avoid trans fats, eat small, frequent meals every 4-5 hours with lean proteins, complex carbs and healthy fats. Minimize simple carbs, GMO foods.  Hyperlipidemia, mixed Tolerating statin, encouraged heart healthy diet, avoid trans fats, minimize simple carbs and saturated fats. Increase exercise as tolerated  Atrial fibrillation Tolerating Eliquis, rate controlled  OA (osteoarthritis) Using Tylenol bid with some results. Stay as active as possible, topical treatments as needed  Obstructive sleep apnea Sleep study and pulmonology referral ordered.

## 2015-03-10 NOTE — Assessment & Plan Note (Signed)
Tolerating Eliquis, rate controlled 

## 2015-03-13 ENCOUNTER — Ambulatory Visit (HOSPITAL_BASED_OUTPATIENT_CLINIC_OR_DEPARTMENT_OTHER): Payer: Medicare Other | Admitting: Pulmonary Disease

## 2015-03-13 DIAGNOSIS — G4730 Sleep apnea, unspecified: Secondary | ICD-10-CM | POA: Diagnosis not present

## 2015-03-13 NOTE — Addendum Note (Signed)
Addended by: Rigoberto Noel on: 03/13/2015 03:55 PM   Modules accepted: Level of Service

## 2015-03-13 NOTE — Progress Notes (Signed)
Patient Name: Tracy Miles, [place] Date: 03/07/2015 Gender: Female D.O.B: April 07, 1934 Age (years): 80 Referring Provider: [name] Height (inches): 98 Interpreting Physician: Kara Mead MD, ABSM Weight (lbs): 262 RPSGT: Laren Everts BMI: 81 MRN: [id number] Neck Size: 16.00 CLINICAL INFORMATION Sleep Study Type: NPSG  Indication for sleep study: Hypertension, Obesity, Re-Evaluation She underwent polysomnogram more than 10 years ago and was told that she has moderate sleep apnea with AHI of approximately 20 per hour and has been maintained on a BiPAP with nasal pillows and humidity  Epworth Sleepiness Score: 5  SLEEP STUDY TECHNIQUE As per the AASM Manual for the Scoring of Sleep and Associated Events v2.3 (April 2016) with a hypopnea requiring 4% desaturations.  The channels recorded and monitored were frontal, central and occipital EEG, electrooculogram (EOG), submentalis EMG (chin), nasal and oral airflow, thoracic and abdominal wall motion, anterior tibialis EMG, snore microphone, electrocardiogram, and pulse oximetry.  MEDICATIONS Medications self-administered by patient during sleep study : No sleep medicine administered.  SLEEP ARCHITECTURE The study was initiated at 11:05:29 PM and ended at 4:57:30 AM.  Sleep onset time was 17.2 minutes and the sleep efficiency was 69.0%. The total sleep time was 243.0 minutes.  Stage REM latency was 190.5 minutes.  The patient spent 19.14% of the night in stage N1 sleep, 69.75% in stage N2 sleep, 1.85% in stage N3 and 9.26% in REM.  Alpha intrusion was absent.  Supine sleep was 0.00%.  RESPIRATORY PARAMETERS The overall apnea/hypopnea index (AHI) was 10.4 per hour. RDI was 24/h. There were 25 total apneas, including 25 obstructive, 0 central and 0 mixed apneas. There were 17 hypopneas and 55 RERAs.  The AHI during Stage REM sleep was 74.7 per hour.  AHI while supine was N/A per hour.  The mean oxygen saturation was  91.54%. The minimum SpO2 during sleep was 84.00%.  Moderate snoring was noted during this study.  CARDIAC DATA The 2 lead EKG demonstrated sinus rhythm. The mean heart rate was 59.14 beats per minute. Other EKG findings include: None. LEG MOVEMENT DATA The total PLMS were 219 with a resulting PLMS index of 54.07. Associated arousal with leg movement index was 4.7 .  IMPRESSIONS Mild obstructive sleep apnea occurred during this study (AHI = 10.4/h). No significant central sleep apnea occurred during this study (CAI = 0.0/h). Moderate oxygen desaturation was noted during this study (Min O2 = 84.00%). The patient snored with Moderate snoring volume. No cardiac abnormalities were noted during this study. Severe periodic limb movements of sleep occurred during the study. No significant associated arousals. Sleep efficiency was poor.  DIAGNOSIS Obstructive Sleep Apnea (327.23 [G47.33 ICD-10]) Nocturnal Hypoxemia (327.26 [G47.36 ICD-10])  RECOMMENDATIONS Therapeutic CPAP titration to determine optimal pressure required to alleviate sleep disordered breathing. Avoid alcohol, sedatives and other CNS depressants that may worsen sleep apnea and disrupt normal sleep architecture. Sleep hygiene should be reviewed to assess factors that may improve sleep quality. Weight management and regular exercise should be initiated or continued if appropriate.  MISCELLANEOUS Note that she is maintained on bipap currently for unclear reason. This may have been due to intolerance of CPAP in the past  Kara Mead MD. FCCP. [facility] ,Critical care & sleep medicine

## 2015-03-14 ENCOUNTER — Ambulatory Visit (INDEPENDENT_AMBULATORY_CARE_PROVIDER_SITE_OTHER): Payer: Medicare Other | Admitting: Adult Health

## 2015-03-14 ENCOUNTER — Encounter: Payer: Self-pay | Admitting: Adult Health

## 2015-03-14 VITALS — BP 130/83 | HR 96 | Temp 98.0°F | Ht 62.0 in | Wt 263.0 lb

## 2015-03-14 DIAGNOSIS — G4733 Obstructive sleep apnea (adult) (pediatric): Secondary | ICD-10-CM | POA: Diagnosis not present

## 2015-03-14 NOTE — Progress Notes (Signed)
Subjective:    Patient ID: Tracy Miles, female    DOB: 09/08/1933, 81 y.o.   MRN: [id number]  HPI  81 year old woman with obstructive sleep apnea presents for management.  03/2014  She is moved from [place] to [place] about a year back. She needs to establish with a new DME.  She underwent polysomnogram more than 10 years ago and was told that she has moderate sleep apnea with AHI of approximately 20 per hour and has been maintained on a BiPAP with nasal pillows and humidity. She reports occasional naps but does feel rested on waking up. Epworth sleepiness score is 5 She admits to being a night owl, bedtime is around midnight, sleep latency is minimal around 10 minutes she sleeps on her side with one pillow, reports one to 2 nocturnal awakenings due to back pain, denies nocturia and is out of bed by 6:30 AM feeling rested without headaches or dryness of mouth. She's gained about 20 pounds since her last titration study. She does have atrial fibrillation , controlled on amiodarone and well-controlled hypertension There is no history suggestive of cataplexy, sleep paralysis or parasomnias She is a retired Radio producer She reports compliance with her BiPAP machine Denies problems with dryness or nasal stuffiness   03/14/2015 Follow up : OSA on BIPAP w/ nasal pillows Pt returns for follow up for OSA Patient wants to switch DME from [facility] to [facility].  She is not happy with [facility] service.  Needs new CPAP machine.  Patients machine is 10 years or older.  Uses nasal pillows, thinks she is having leaks.  She likes the nasal pillows, cannot do full face mask.    Download shows good compliance , avg usage 7.5hr /day. , HI 3.1 .  On BIPAP 12/16 , + leaks . Nasal pillows are very old.  She says she can not live without her machine.  No sign daytime sleepiness.  Denies chest pain, orthopnea, edema or fever.  PCP ordered repeat sleep study on 03/07/15 >had trouble sleeping due to back pain (total  sleep time was only 243 min ) AHI 10.4./hr  (AHI during REM 74.7 /hr )  Low sat 84%.  Severe PLMS.  Says she can not sleep without machine and bed at sleep lab caused back pain.     Review of Systems Constitutional:   No  weight loss, night sweats,  Fevers, chills, fatigue, or  lassitude.  HEENT:   No headaches,  Difficulty swallowing,  Tooth/dental problems, or  Sore throat,                No sneezing, itching, ear ache, nasal congestion, post nasal drip,   CV:  No chest pain,  Orthopnea, PND, swelling in lower extremities, anasarca, dizziness, palpitations, syncope.   GI  No heartburn, indigestion, abdominal pain, nausea, vomiting, diarrhea, change in bowel habits, loss of appetite, bloody stools.   Resp: No shortness of breath with exertion or at rest.  No excess mucus, no productive cough,  No non-productive cough,  No coughing up of blood.  No change in color of mucus.  No wheezing.  No chest wall deformity  Skin: no rash or lesions.  GU: no dysuria, change in color of urine, no urgency or frequency.  No flank pain, no hematuria   MS:  No joint pain or swelling.  No decreased range of motion.  No back pain.  Psych:  No change in mood or affect. No depression or anxiety.  No memory loss.  Objective:   Physical Exam GEN: A/Ox3; pleasant , NAD, obese   HEENT:  [place]/AT,  EACs-clear, TMs-wnl, NOSE-clear, THROAT-clear, no lesions, no postnasal drip or exudate noted. Class 2 airway   NECK:  Supple w/ fair ROM; no JVD; normal carotid impulses w/o bruits; no thyromegaly or nodules palpated; no lymphadenopathy.  RESP  Clear  P & A; w/o, wheezes/ rales/ or rhonchi.no accessory muscle use, no dullness to percussion  CARD:  RRR, no m/r/g  , tr  peripheral edema, pulses intact, no cyanosis or clubbing.  GI:   Soft & nt; nml bowel sounds; no organomegaly or masses detected.  Musco: Warm bil, no deformities or joint swelling noted.   Neuro: alert, no focal deficits noted.     Skin: Warm, no lesions or rashes         Assessment & Plan:

## 2015-03-14 NOTE — Assessment & Plan Note (Signed)
Recent download with good complaince and control  Pt requests DME change to [facility]  Order sent  Plan   Great job with BIPAP  Keep up good work .  Work on weight loss Do not drive if sleepy.  We are sending order to [facility] for new machine and supplies .  follow up Dr. Elsworth Soho  In 1 year.

## 2015-03-14 NOTE — Patient Instructions (Addendum)
Great job with BIPAP  Keep up good work .  Work on weight loss Do not drive if sleepy.  We are sending order to [facility] for new machine and supplies .  follow up Dr. Elsworth Soho  In 1 year.  Download in 1 month after new machine set up

## 2015-03-18 NOTE — Progress Notes (Signed)
Reviewed & agree with plan  

## 2015-03-22 ENCOUNTER — Encounter (HOSPITAL_BASED_OUTPATIENT_CLINIC_OR_DEPARTMENT_OTHER): Payer: Self-pay

## 2015-03-22 ENCOUNTER — Emergency Department (HOSPITAL_BASED_OUTPATIENT_CLINIC_OR_DEPARTMENT_OTHER)
Admission: EM | Admit: 2015-03-22 | Discharge: 2015-03-22 | Disposition: A | Discharge status: Home / Self Care | Payer: Medicare Other | Attending: Physician Assistant | Admitting: Physician Assistant

## 2015-03-22 DIAGNOSIS — Z8719 Personal history of other diseases of the digestive system: Secondary | ICD-10-CM | POA: Insufficient documentation

## 2015-03-22 DIAGNOSIS — Z862 Personal history of diseases of the blood and blood-forming organs and certain disorders involving the immune mechanism: Secondary | ICD-10-CM | POA: Insufficient documentation

## 2015-03-22 DIAGNOSIS — E669 Obesity, unspecified: Secondary | ICD-10-CM | POA: Diagnosis not present

## 2015-03-22 DIAGNOSIS — Z9981 Dependence on supplemental oxygen: Secondary | ICD-10-CM | POA: Diagnosis not present

## 2015-03-22 DIAGNOSIS — Z880 Allergy status to penicillin: Secondary | ICD-10-CM | POA: Insufficient documentation

## 2015-03-22 DIAGNOSIS — Z8619 Personal history of other infectious and parasitic diseases: Secondary | ICD-10-CM | POA: Diagnosis not present

## 2015-03-22 DIAGNOSIS — L0291 Cutaneous abscess, unspecified: Secondary | ICD-10-CM | POA: Diagnosis not present

## 2015-03-22 DIAGNOSIS — G4733 Obstructive sleep apnea (adult) (pediatric): Secondary | ICD-10-CM | POA: Diagnosis not present

## 2015-03-22 DIAGNOSIS — L02215 Cutaneous abscess of perineum: Secondary | ICD-10-CM | POA: Diagnosis not present

## 2015-03-22 DIAGNOSIS — Z86718 Personal history of other venous thrombosis and embolism: Secondary | ICD-10-CM | POA: Insufficient documentation

## 2015-03-22 DIAGNOSIS — Z8739 Personal history of other diseases of the musculoskeletal system and connective tissue: Secondary | ICD-10-CM | POA: Insufficient documentation

## 2015-03-22 DIAGNOSIS — E785 Hyperlipidemia, unspecified: Secondary | ICD-10-CM | POA: Diagnosis not present

## 2015-03-22 DIAGNOSIS — L0231 Cutaneous abscess of buttock: Secondary | ICD-10-CM | POA: Diagnosis not present

## 2015-03-22 DIAGNOSIS — Z79899 Other long term (current) drug therapy: Secondary | ICD-10-CM | POA: Insufficient documentation

## 2015-03-22 DIAGNOSIS — L0390 Cellulitis, unspecified: Secondary | ICD-10-CM | POA: Diagnosis not present

## 2015-03-22 DIAGNOSIS — R011 Cardiac murmur, unspecified: Secondary | ICD-10-CM | POA: Insufficient documentation

## 2015-03-22 DIAGNOSIS — I10 Essential (primary) hypertension: Secondary | ICD-10-CM | POA: Diagnosis not present

## 2015-03-22 DIAGNOSIS — Z86010 Personal history of colonic polyps: Secondary | ICD-10-CM | POA: Insufficient documentation

## 2015-03-22 DIAGNOSIS — I2510 Atherosclerotic heart disease of native coronary artery without angina pectoris: Secondary | ICD-10-CM | POA: Insufficient documentation

## 2015-03-22 DIAGNOSIS — E780 Pure hypercholesterolemia: Secondary | ICD-10-CM | POA: Insufficient documentation

## 2015-03-22 MED ORDER — CLINDAMYCIN HCL 150 MG PO CAPS: 300.0000 mg | ORAL_CAPSULE | Freq: Once | ORAL | Status: DC

## 2015-03-22 MED ORDER — CLINDAMYCIN HCL 300 MG PO CAPS: 300.0000 mg | ORAL_CAPSULE | Freq: Four times a day (QID) | ORAL | Status: AC

## 2015-03-22 MED ORDER — LIDOCAINE HCL (PF) 1 % IJ SOLN
5.0000 mL | Freq: Once | INTRAMUSCULAR | Status: AC
  Administered 2015-03-22: 5 mL via INTRADERMAL
  Filled 2015-03-22: qty 5

## 2015-03-22 NOTE — ED Notes (Signed)
C/o abscess to left buttock x 3-4 days

## 2015-03-22 NOTE — Discharge Instructions (Signed)
Please use warm compresses 3 times a day and take your anitbiotics as perscribed. Please take ALL the prescribed medications. Please return to the ED if you have concerns.  Return if you feel poorly, have fevers or other symtpoms.  We expect the abscess and cellulitis to get better with antibiotics.   Abscess An abscess is an infected area that contains a collection of pus and debris.It can occur in almost any part of the body. An abscess is also known as a furuncle or boil. CAUSES  An abscess occurs when tissue gets infected. This can occur from blockage of oil or sweat glands, infection of hair follicles, or a minor injury to the skin. As the body tries to fight the infection, pus collects in the area and creates pressure under the skin. This pressure causes pain. People with weakened immune systems have difficulty fighting infections and get certain abscesses more often.  SYMPTOMS Usually an abscess develops on the skin and becomes a painful mass that is red, warm, and tender. If the abscess forms under the skin, you may feel a moveable soft area under the skin. Some abscesses break open (rupture) on their own, but most will continue to get worse without care. The infection can spread deeper into the body and eventually into the bloodstream, causing you to feel ill.  DIAGNOSIS  Your caregiver will take your medical history and perform a physical exam. A sample of fluid may also be taken from the abscess to determine what is causing your infection. TREATMENT  Your caregiver may prescribe antibiotic medicines to fight the infection. However, taking antibiotics alone usually does not cure an abscess. Your caregiver may need to make a small cut (incision) in the abscess to drain the pus. In some cases, gauze is packed into the abscess to reduce pain and to continue draining the area. HOME CARE INSTRUCTIONS   Only take over-the-counter or prescription medicines for pain, discomfort, or fever as directed  by your caregiver.  If you were prescribed antibiotics, take them as directed. Finish them even if you start to feel better.  If gauze is used, follow your caregiver's directions for changing the gauze.  To avoid spreading the infection:  Keep your draining abscess covered with a bandage.  Wash your hands well.  Do not share personal care items, towels, or whirlpools with others.  Avoid skin contact with others.  Keep your skin and clothes clean around the abscess.  Keep all follow-up appointments as directed by your caregiver. SEEK MEDICAL CARE IF:   You have increased pain, swelling, redness, fluid drainage, or bleeding.  You have muscle aches, chills, or a general ill feeling.  You have a fever. MAKE SURE YOU:   Understand these instructions.  Will watch your condition.  Will get help right away if you are not doing well or get worse. Document Released: 05/27/2005 Document Revised: 02/16/2012 Document Reviewed: 10/30/2011 [facility] Patient Information 2015 [place], [place]. This information is not intended to replace advice given to you by your health care provider. Make sure you discuss any questions you have with your health care provider.

## 2015-03-22 NOTE — ED Provider Notes (Signed)
CSN: [id number]     Arrival date & time 03/22/15  1142 History   First MD Initiated Contact with Patient 03/22/15 1158     Chief Complaint  Patient presents with  . Abscess     (Consider location/radiation/quality/duration/timing/severity/associated sxs/prior Treatment) Patient is a 81 y.o. female presenting with abscess.  Abscess Associated symptoms: no headaches    Patient is a 81 year old female on Coumadin past history significant for obesity hyperglycemia stenosis CAD and A. fib. Patient presenting today with abscess and surrounding cellulitis her right gluteal fold. Patient started yesterday and has been getting harder. It started to drain this morning. No fevers no change in appetite no chills. He started taking clindamycin that she had left over from a dental procedure she took 2 pills last night and again 2 this morning.  Past Medical History  Diagnosis Date  . Hypertension   . Heart murmur   . Coronary artery disease   . Atrial fibrillation   . Anemia   . Arthritis   . Colon polyp   . Personal history of DVT (deep vein thrombosis)     x 2  . Hyperlipidemia   . Obesity   . Pancreatitis     post hysterectomy  . Sleep apnea   . Aortic stenosis     Very mild  . Hypercholesteremia   . OSA on CPAP   . PAF (paroxysmal atrial fibrillation)   . History of deep vein thrombophlebitis of lower extremity   . History of valvular heart disease     LEFT  . Chicken pox as a child  . Measles as a child  . Mumps child and teenager  . Allergy   . Obesity   . Unspecified constipation 11/16/2013  . Heme positive stool 11/16/2013  . DDD (degenerative disc disease) 11/16/2013  . History of DVT (deep vein thrombosis) 11/19/2013  . Unspecified sleep apnea 11/19/2013  . Allergic state 11/19/2013  . OA (osteoarthritis) 11/19/2013    S/p L TKR  . Benign paroxysmal positional vertigo 12/17/2013  . Abnormal results of thyroid function studies 12/17/2013  . Iron deficiency anemia 11/13/2013   . Personal history of colonic polyps 02/25/2014  . Spinal stenosis   . Obstructive sleep apnea 02/22/2015   Past Surgical History  Procedure Laterality Date  . Tonsilectomy, adenoidectomy, bilateral myringotomy and tubes  child  . Laminectomy  42 yrs ago  . Tubal ligation  80 years old  . Total abdominal hysterectomy  1995    had 2 tumors- benign  . Replacement total knee Left 2013  . Cyst removal hand Bilateral 20 yrs ago  . Meniscus repair Bilateral 2000 and 2010  . Appendectomy     Family History  Problem Relation Age of Onset  . Heart attack Mother 18  . Hyperlipidemia Mother     ?  Marland Kitchen Dementia Mother   . Pernicious anemia Mother   . COPD Father     smoker  . Cancer Father 41    prostate  . Heart disease Brother     quadruple bipass surgery  . Hyperlipidemia Brother   . Hypertension Brother   . Diabetes Maternal Grandmother     type 2  . Pernicious anemia Maternal Grandmother   . Colon cancer Neg Hx   . Pancreatic cancer Neg Hx   . Stomach cancer Neg Hx   . Throat cancer Neg Hx   . Liver disease Neg Hx   . Gout Son   . Atrial fibrillation Son   .  Hyperlipidemia Son   . Cancer Maternal Grandfather     liver  . Cancer Paternal Grandmother     lung- doesn't think she smokes?  . Stroke Paternal Grandfather   . Cancer Son 50    non hodgin's lymphoma  . Gout Son   . Hyperlipidemia Son   . Sleep apnea Son    History  Substance Use Topics  . Smoking status: Never Smoker   . Smokeless tobacco: Never Used     Comment: never used tobacco  . Alcohol Use: No   OB History    No data available     Review of Systems  Constitutional: Negative for activity change.  Cardiovascular: Negative for chest pain.  Gastrointestinal: Negative for abdominal pain.  Neurological: Negative for headaches.  Psychiatric/Behavioral: Negative for confusion.      Allergies  Gabapentin; Zebeta; Penicillins; and Sulfa antibiotics  Home Medications   Prior to Admission  medications   Medication Sig Start Date End Date Taking? Authorizing Provider  apixaban (ELIQUIS) 5 MG TABS tablet Take 1 tablet (5 mg total) by mouth 2 (two) times daily. 02/22/15   [name], MD  atorvastatin (LIPITOR) 20 MG tablet Take 1 tablet (20 mg total) by mouth daily. 02/22/15   [name], MD  bumetanide (BUMEX) 1 MG tablet TAKE 1 TABLET (1 MG TOTAL) BY MOUTH DAILY. 11/26/14   [name], MD  calcium carbonate (OS-CAL) 600 MG TABS tablet Take 600 mg by mouth 2 (two) times daily with a meal.    Historical Provider, MD  lisinopril (PRINIVIL,ZESTRIL) 10 MG tablet Take 1 tablet (10 mg total) by mouth daily. 02/22/15   [name], MD  loratadine (CLARITIN) 10 MG tablet Take 10 mg by mouth daily as needed.     Historical Provider, MD  potassium chloride (KLOR-CON 10) 10 MEQ tablet TAKE ONE (1) TABLET EACH DAY 02/22/15   [name], MD  psyllium (REGULOID) 0.52 G capsule Take 0.52 g by mouth 2 (two) times daily.    Historical Provider, MD  verapamil (CALAN-SR) 180 MG CR tablet Take 1 tablet (180 mg total) by mouth at bedtime. 02/22/15   [name], MD   BP 155/72 mmHg  Pulse 82  Temp(Src) 99.7 F (37.6 C) (Oral)  Resp 18  Ht 5\' 2"  (1.575 m)  Wt 262 lb (118.842 kg)  BMI 47.91 kg/m2  SpO2 96% Physical Exam  Constitutional: She is oriented to person, place, and time. She appears well-developed and well-nourished.  HENT:  Head: Normocephalic and atraumatic.  Eyes: Right eye exhibits no discharge.  Cardiovascular: Normal rate, regular rhythm and normal heart sounds.   No murmur heard. Pulmonary/Chest: Effort normal and breath sounds normal. She has no wheezes. She has no rales.  Abdominal: Soft. She exhibits no distension. There is no tenderness.  Neurological: She is oriented to person, place, and time.  Skin: Skin is warm and dry. She is not diaphoretic.  R gluteal fold perineal area- 3 X 6 indurated area with one open area.   Psychiatric: She has a normal mood and  affect.  Nursing note and vitals reviewed.   ED Course  INCISION AND DRAINAGE Date/Time: 03/22/2015 12:16 PM Performed by: Zenovia Jarred LYN Authorized by: Zenovia Jarred LYN Consent: Verbal consent not obtained. Patient consent: the patient's understanding of the procedure matches consent given Patient identity confirmed: verbally with patient Type: abscess Body area: anogenital Location details: gluteal cleft Anesthesia: local infiltration Local anesthetic: lidocaine 1% with  epinephrine Scalpel size: 15 Needle gauge: 18 Incision type: single straight Complexity: complex Drainage: purulent Drainage amount: moderate Wound treatment: wound left open Packing material: wick placed Patient tolerance: Patient tolerated the procedure well with no immediate complications   (including critical care time) Labs Review Labs Reviewed - No data to display  Imaging Review No results found.   EKG Interpretation None      MDM   Final diagnoses:  None    Patient is a 81 year old female presenting with abscess concerning cellulitis on her right heel fold. Patient said this started 2 days ago and has been getting worse. She is indurated area. It does have 1 area that opened and draining. No involvement of vaginal folds. Mild surrounding erythema.  We'll plan for incision and drainage and then continuation of her clindamycin for surrounding erythema given her risk factors for cellulitis and infection.   Lafayette Dunlevy Julio Alm, MD 03/22/15 1217

## 2015-03-25 DIAGNOSIS — L0390 Cellulitis, unspecified: Secondary | ICD-10-CM | POA: Diagnosis not present

## 2015-03-25 DIAGNOSIS — L03319 Cellulitis of trunk, unspecified: Secondary | ICD-10-CM | POA: Diagnosis not present

## 2015-03-26 DIAGNOSIS — L0390 Cellulitis, unspecified: Secondary | ICD-10-CM | POA: Diagnosis not present

## 2015-03-27 DIAGNOSIS — L0390 Cellulitis, unspecified: Secondary | ICD-10-CM | POA: Diagnosis not present

## 2015-03-28 ENCOUNTER — Ambulatory Visit: Payer: Medicare Other | Admitting: Pulmonary Disease

## 2015-03-28 DIAGNOSIS — L03319 Cellulitis of trunk, unspecified: Secondary | ICD-10-CM | POA: Diagnosis not present

## 2015-03-28 DIAGNOSIS — L0390 Cellulitis, unspecified: Secondary | ICD-10-CM | POA: Diagnosis not present

## 2015-03-29 DIAGNOSIS — L0390 Cellulitis, unspecified: Secondary | ICD-10-CM | POA: Diagnosis not present

## 2015-03-30 DIAGNOSIS — L0390 Cellulitis, unspecified: Secondary | ICD-10-CM | POA: Diagnosis not present

## 2015-03-31 DIAGNOSIS — L0390 Cellulitis, unspecified: Secondary | ICD-10-CM | POA: Diagnosis not present

## 2015-04-01 DIAGNOSIS — M799 Soft tissue disorder, unspecified: Secondary | ICD-10-CM | POA: Diagnosis not present

## 2015-04-01 DIAGNOSIS — I482 Chronic atrial fibrillation: Secondary | ICD-10-CM | POA: Diagnosis not present

## 2015-04-01 DIAGNOSIS — Z7901 Long term (current) use of anticoagulants: Secondary | ICD-10-CM | POA: Diagnosis not present

## 2015-04-01 DIAGNOSIS — L03317 Cellulitis of buttock: Secondary | ICD-10-CM | POA: Diagnosis not present

## 2015-04-02 DIAGNOSIS — L0390 Cellulitis, unspecified: Secondary | ICD-10-CM | POA: Diagnosis not present

## 2015-04-03 DIAGNOSIS — L03317 Cellulitis of buttock: Secondary | ICD-10-CM | POA: Diagnosis not present

## 2015-04-04 DIAGNOSIS — L03317 Cellulitis of buttock: Secondary | ICD-10-CM | POA: Diagnosis not present

## 2015-04-05 DIAGNOSIS — L03317 Cellulitis of buttock: Secondary | ICD-10-CM | POA: Diagnosis not present

## 2015-04-06 DIAGNOSIS — L03317 Cellulitis of buttock: Secondary | ICD-10-CM | POA: Diagnosis not present

## 2015-04-07 DIAGNOSIS — L03317 Cellulitis of buttock: Secondary | ICD-10-CM | POA: Diagnosis not present

## 2015-04-08 DIAGNOSIS — M799 Soft tissue disorder, unspecified: Secondary | ICD-10-CM | POA: Diagnosis not present

## 2015-04-08 DIAGNOSIS — R197 Diarrhea, unspecified: Secondary | ICD-10-CM | POA: Diagnosis not present

## 2015-04-08 DIAGNOSIS — T887XXA Unspecified adverse effect of drug or medicament, initial encounter: Secondary | ICD-10-CM | POA: Diagnosis not present

## 2015-04-08 DIAGNOSIS — L03317 Cellulitis of buttock: Secondary | ICD-10-CM | POA: Diagnosis not present

## 2015-04-11 ENCOUNTER — Other Ambulatory Visit: Payer: Self-pay | Admitting: Family Medicine

## 2015-04-11 DIAGNOSIS — Z1231 Encounter for screening mammogram for malignant neoplasm of breast: Secondary | ICD-10-CM

## 2015-04-22 DIAGNOSIS — Z7901 Long term (current) use of anticoagulants: Secondary | ICD-10-CM | POA: Diagnosis not present

## 2015-04-22 DIAGNOSIS — L03317 Cellulitis of buttock: Secondary | ICD-10-CM | POA: Diagnosis not present

## 2015-04-22 DIAGNOSIS — B372 Candidiasis of skin and nail: Secondary | ICD-10-CM | POA: Diagnosis not present

## 2015-05-09 ENCOUNTER — Ambulatory Visit (HOSPITAL_BASED_OUTPATIENT_CLINIC_OR_DEPARTMENT_OTHER)
Admission: RE | Admit: 2015-05-09 | Discharge: 2015-05-09 | Disposition: A | Discharge status: Home / Self Care | Payer: Medicare Other | Source: Ambulatory Visit | Attending: Family Medicine | Admitting: Family Medicine

## 2015-05-09 DIAGNOSIS — Z1231 Encounter for screening mammogram for malignant neoplasm of breast: Secondary | ICD-10-CM | POA: Insufficient documentation

## 2015-05-24 ENCOUNTER — Telehealth: Payer: Self-pay | Admitting: Pulmonary Disease

## 2015-05-24 NOTE — Telephone Encounter (Signed)
Per 03/14/15 OV: Patient Instructions       Great job with BIPAP   Keep up good work .   Work on weight loss Do not drive if sleepy.   We are sending order to [facility] for new machine and supplies .   follow up Dr. Elsworth Soho  In 1 year.   Download in 1 month after new machine set up  --  Called spoke with [place] from [facility]. She reports pt received a new machine and insurance requires a follow up 31-91 days after set up. Pt needs to be seen before 07/04/15. Called pt and appt scheduled to see TP in [place] in October. Nothing further needed

## 2015-05-27 ENCOUNTER — Telehealth: Payer: Self-pay | Admitting: Adult Health

## 2015-05-27 NOTE — Telephone Encounter (Signed)
Pt cb for results, please cb at previous number listed

## 2015-05-27 NOTE — Telephone Encounter (Signed)
CPAP download results received and reviewed by TP: Great job; keep up the good work.  Follow up as planned.  Download sent for scan.  LMOM TCB x1.

## 2015-05-27 NOTE — Telephone Encounter (Signed)
Pt is aware of results. Nothing further was needed. 

## 2015-06-18 DIAGNOSIS — H26493 Other secondary cataract, bilateral: Secondary | ICD-10-CM | POA: Diagnosis not present

## 2015-06-20 ENCOUNTER — Encounter: Payer: Self-pay | Admitting: Adult Health

## 2015-06-20 ENCOUNTER — Ambulatory Visit (INDEPENDENT_AMBULATORY_CARE_PROVIDER_SITE_OTHER): Payer: Medicare Other | Admitting: Adult Health

## 2015-06-20 VITALS — BP 119/77 | HR 71 | Temp 98.1°F | Ht 64.0 in | Wt 263.0 lb

## 2015-06-20 DIAGNOSIS — E669 Obesity, unspecified: Secondary | ICD-10-CM

## 2015-06-20 DIAGNOSIS — G4733 Obstructive sleep apnea (adult) (pediatric): Secondary | ICD-10-CM

## 2015-06-20 NOTE — Patient Instructions (Addendum)
Great job with BIPAP  Keep up good work .  Work on weight loss Do not drive if sleepy.   follow up Dr. Alva  In 1 year.    

## 2015-06-20 NOTE — Progress Notes (Signed)
Subjective:    Patient ID: Tracy Miles, female    DOB: 1934-02-14, 81 y.o.   MRN: [id number]  HPI   81 year old woman with obstructive sleep apnea presents for management.  03/2014  She is moved from [place] to [place] about a year back. She needs to establish with a new DME.  She underwent polysomnogram more than 10 years ago and was told that she has moderate sleep apnea with AHI of approximately 20 per hour and has been maintained on a BiPAP with nasal pillows and humidity. She reports occasional naps but does feel rested on waking up. Epworth sleepiness score is 5 She admits to being a night owl, bedtime is around midnight, sleep latency is minimal around 10 minutes she sleeps on her side with one pillow, reports one to 2 nocturnal awakenings due to back pain, denies nocturia and is out of bed by 6:30 AM feeling rested without headaches or dryness of mouth. She's gained about 20 pounds since her last titration study. 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  Download from 9/18 to 06/17/15  shows excellent compliance , avg usage 7.5hr /day. , HI 2.2 .  On BIPAP 12/16 , min leaks .  She says she can not live without her machine.  No sign daytime sleepiness, does not nap. .  Denies chest pain, orthopnea, edema or fever.     Review of Systems  Constitutional:   No  weight loss, night sweats,  Fevers, chills, fatigue, or  lassitude.  HEENT:   No headaches,  Difficulty swallowing,  Tooth/dental problems, or  Sore throat,                No sneezing, itching, ear ache, nasal  congestion, post nasal drip,   CV:  No chest pain,  Orthopnea, PND, swelling in lower extremities, anasarca, dizziness, palpitations, syncope.   GI  No heartburn, indigestion, abdominal pain, nausea, vomiting, diarrhea, change in bowel habits, loss of appetite, bloody stools.   Resp: No shortness of breath with exertion or at rest.  No excess mucus, no productive cough,  No non-productive cough,  No coughing up of blood.  No change in color of mucus.  No wheezing.  No chest wall deformity  Skin: no rash or lesions.  GU: no dysuria, change in color of urine, no urgency or frequency.  No flank pain, no hematuria   MS:  No joint pain or swelling.  No decreased range of motion.  No back pain.  Psych:  No change in mood or affect. No depression or anxiety.  No memory loss.         Objective:   Physical Exam  GEN: A/Ox3; pleasant , NAD, obese   HEENT:  [place]/AT,  EACs-clear, TMs-wnl, NOSE-clear, THROAT-clear, no lesions, no postnasal drip or exudate noted. Class 2 airway   NECK:  Supple w/ fair ROM; no JVD; normal carotid impulses w/o bruits; no thyromegaly or nodules palpated; no lymphadenopathy.  RESP  Clear  P & A; w/o, wheezes/ rales/ or rhonchi.no accessory muscle use, no dullness to percussion  CARD:  RRR, no m/r/g  , tr  peripheral edema, pulses intact, no cyanosis or clubbing.  GI:   Soft & nt; nml bowel sounds; no organomegaly or masses detected.  Musco: Warm bil, no deformities or joint swelling noted.   Neuro: alert, no focal deficits noted.    Skin: Warm, no lesions or rashes         Assessment & Plan:

## 2015-06-20 NOTE — Assessment & Plan Note (Signed)
Encouraged on wt loss  

## 2015-06-20 NOTE — Progress Notes (Signed)
Reviewed & agree with plan  

## 2015-06-20 NOTE — Assessment & Plan Note (Signed)
Compensated on [place] job with BIPAP  Keep up good work .  Work on weight loss Do not drive if sleepy.   follow up Dr. Elsworth Soho  In 1 year.

## 2015-07-11 ENCOUNTER — Encounter: Payer: Self-pay | Admitting: Adult Health

## 2015-07-29 DIAGNOSIS — J209 Acute bronchitis, unspecified: Secondary | ICD-10-CM | POA: Diagnosis not present

## 2015-08-02 ENCOUNTER — Other Ambulatory Visit: Payer: Self-pay | Admitting: Family Medicine

## 2015-08-05 ENCOUNTER — Encounter (HOSPITAL_BASED_OUTPATIENT_CLINIC_OR_DEPARTMENT_OTHER): Payer: Self-pay | Admitting: *Deleted

## 2015-08-05 ENCOUNTER — Ambulatory Visit (INDEPENDENT_AMBULATORY_CARE_PROVIDER_SITE_OTHER): Payer: Medicare Other | Admitting: Physician Assistant

## 2015-08-05 ENCOUNTER — Emergency Department (HOSPITAL_BASED_OUTPATIENT_CLINIC_OR_DEPARTMENT_OTHER): Payer: Medicare Other

## 2015-08-05 ENCOUNTER — Encounter: Payer: Self-pay | Admitting: Physician Assistant

## 2015-08-05 ENCOUNTER — Inpatient Hospital Stay (HOSPITAL_BASED_OUTPATIENT_CLINIC_OR_DEPARTMENT_OTHER)
Admission: EM | Admit: 2015-08-05 | Discharge: 2015-08-11 | DRG: 308 | Disposition: A | Discharge status: Home / Self Care | Payer: Medicare Other | Attending: Internal Medicine | Admitting: Internal Medicine

## 2015-08-05 VITALS — BP 119/80 | HR 152 | Temp 98.0°F | Ht 64.0 in | Wt 269.4 lb

## 2015-08-05 DIAGNOSIS — Z6841 Body Mass Index (BMI) 40.0 and over, adult: Secondary | ICD-10-CM | POA: Diagnosis not present

## 2015-08-05 DIAGNOSIS — I4581 Long QT syndrome: Secondary | ICD-10-CM | POA: Diagnosis present

## 2015-08-05 DIAGNOSIS — I4891 Unspecified atrial fibrillation: Secondary | ICD-10-CM

## 2015-08-05 DIAGNOSIS — Z96652 Presence of left artificial knee joint: Secondary | ICD-10-CM | POA: Diagnosis present

## 2015-08-05 DIAGNOSIS — R0602 Shortness of breath: Secondary | ICD-10-CM | POA: Diagnosis not present

## 2015-08-05 DIAGNOSIS — Z825 Family history of asthma and other chronic lower respiratory diseases: Secondary | ICD-10-CM

## 2015-08-05 DIAGNOSIS — I10 Essential (primary) hypertension: Secondary | ICD-10-CM | POA: Diagnosis not present

## 2015-08-05 DIAGNOSIS — G4730 Sleep apnea, unspecified: Secondary | ICD-10-CM | POA: Diagnosis present

## 2015-08-05 DIAGNOSIS — Z888 Allergy status to other drugs, medicaments and biological substances status: Secondary | ICD-10-CM | POA: Diagnosis not present

## 2015-08-05 DIAGNOSIS — I5033 Acute on chronic diastolic (congestive) heart failure: Secondary | ICD-10-CM | POA: Diagnosis present

## 2015-08-05 DIAGNOSIS — Z79899 Other long term (current) drug therapy: Secondary | ICD-10-CM | POA: Diagnosis not present

## 2015-08-05 DIAGNOSIS — Z8672 Personal history of thrombophlebitis: Secondary | ICD-10-CM

## 2015-08-05 DIAGNOSIS — R05 Cough: Secondary | ICD-10-CM | POA: Diagnosis not present

## 2015-08-05 DIAGNOSIS — Z7901 Long term (current) use of anticoagulants: Secondary | ICD-10-CM | POA: Diagnosis not present

## 2015-08-05 DIAGNOSIS — I509 Heart failure, unspecified: Secondary | ICD-10-CM

## 2015-08-05 DIAGNOSIS — I2510 Atherosclerotic heart disease of native coronary artery without angina pectoris: Secondary | ICD-10-CM | POA: Diagnosis not present

## 2015-08-05 DIAGNOSIS — I350 Nonrheumatic aortic (valve) stenosis: Secondary | ICD-10-CM | POA: Diagnosis present

## 2015-08-05 DIAGNOSIS — I481 Persistent atrial fibrillation: Secondary | ICD-10-CM | POA: Diagnosis not present

## 2015-08-05 DIAGNOSIS — R011 Cardiac murmur, unspecified: Secondary | ICD-10-CM | POA: Diagnosis present

## 2015-08-05 DIAGNOSIS — I5032 Chronic diastolic (congestive) heart failure: Secondary | ICD-10-CM

## 2015-08-05 DIAGNOSIS — Z8249 Family history of ischemic heart disease and other diseases of the circulatory system: Secondary | ICD-10-CM | POA: Diagnosis not present

## 2015-08-05 DIAGNOSIS — G4733 Obstructive sleep apnea (adult) (pediatric): Secondary | ICD-10-CM | POA: Diagnosis present

## 2015-08-05 DIAGNOSIS — I480 Paroxysmal atrial fibrillation: Principal | ICD-10-CM | POA: Diagnosis present

## 2015-08-05 DIAGNOSIS — Z880 Allergy status to penicillin: Secondary | ICD-10-CM

## 2015-08-05 DIAGNOSIS — E785 Hyperlipidemia, unspecified: Secondary | ICD-10-CM | POA: Diagnosis present

## 2015-08-05 DIAGNOSIS — Z881 Allergy status to other antibiotic agents status: Secondary | ICD-10-CM | POA: Diagnosis not present

## 2015-08-05 DIAGNOSIS — R002 Palpitations: Secondary | ICD-10-CM | POA: Diagnosis not present

## 2015-08-05 DIAGNOSIS — Z86718 Personal history of other venous thrombosis and embolism: Secondary | ICD-10-CM | POA: Diagnosis not present

## 2015-08-05 DIAGNOSIS — R000 Tachycardia, unspecified: Secondary | ICD-10-CM | POA: Diagnosis not present

## 2015-08-05 DIAGNOSIS — Z5181 Encounter for therapeutic drug level monitoring: Secondary | ICD-10-CM

## 2015-08-05 LAB — CBC WITH DIFFERENTIAL/PLATELET
BASOS ABS: 0 10*3/uL (ref 0.0–0.1)
BASOS PCT: 0 %
EOS ABS: 0.2 10*3/uL (ref 0.0–0.7)
Eosinophils Relative: 3 %
HEMATOCRIT: 35.8 % — AB (ref 36.0–46.0)
Hemoglobin: 11.9 g/dL — ABNORMAL LOW (ref 12.0–15.0)
Lymphocytes Relative: 17 %
Lymphs Abs: 1.3 10*3/uL (ref 0.7–4.0)
MCH: 32.2 pg (ref 26.0–34.0)
MCHC: 33.2 g/dL (ref 30.0–36.0)
MCV: 96.8 fL (ref 78.0–100.0)
MONO ABS: 0.8 10*3/uL (ref 0.1–1.0)
Monocytes Relative: 11 %
NEUTROS ABS: 5 10*3/uL (ref 1.7–7.7)
NEUTROS PCT: 69 %
Platelets: 202 10*3/uL (ref 150–400)
RBC: 3.7 MIL/uL — ABNORMAL LOW (ref 3.87–5.11)
RDW: 12.7 % (ref 11.5–15.5)
WBC: 7.2 10*3/uL (ref 4.0–10.5)

## 2015-08-05 LAB — COMPREHENSIVE METABOLIC PANEL
ALBUMIN: 3.9 g/dL (ref 3.5–5.0)
ALT: 17 U/L (ref 14–54)
ANION GAP: 9 (ref 5–15)
AST: 26 U/L (ref 15–41)
Alkaline Phosphatase: 103 U/L (ref 38–126)
BILIRUBIN TOTAL: 1 mg/dL (ref 0.3–1.2)
BUN: 18 mg/dL (ref 6–20)
CHLORIDE: 105 mmol/L (ref 101–111)
CO2: 26 mmol/L (ref 22–32)
Calcium: 9.1 mg/dL (ref 8.9–10.3)
Creatinine, Ser: 1.02 mg/dL — ABNORMAL HIGH (ref 0.44–1.00)
GFR calc Af Amer: 59 mL/min — ABNORMAL LOW (ref >60)
GFR calc non Af Amer: 51 mL/min — ABNORMAL LOW (ref >60)
GLUCOSE: 107 mg/dL — AB (ref 65–99)
POTASSIUM: 3.5 mmol/L (ref 3.5–5.1)
SODIUM: 140 mmol/L (ref 135–145)
TOTAL PROTEIN: 6.8 g/dL (ref 6.5–8.1)

## 2015-08-05 LAB — BRAIN NATRIURETIC PEPTIDE: B Natriuretic Peptide: 322.3 pg/mL — ABNORMAL HIGH (ref 0.0–100.0)

## 2015-08-05 LAB — PROTIME-INR
INR: 1.27 (ref 0.00–1.49)
PROTHROMBIN TIME: 16 s — AB (ref 11.6–15.2)

## 2015-08-05 LAB — TROPONIN I: Troponin I: <0.03 ng/mL (ref <0.031)

## 2015-08-05 MED ORDER — PSYLLIUM 95 % PO PACK
1.0000 | PACK | Freq: Two times a day (BID) | ORAL | Status: DC
Start: 2015-08-05 — End: 2015-08-11
  Administered 2015-08-06 (×2): 1 via ORAL
  Filled 2015-08-05 (×6): qty 1

## 2015-08-05 MED ORDER — DILTIAZEM LOAD VIA INFUSION
10.0000 mg | Freq: Once | INTRAVENOUS | Status: AC
  Administered 2015-08-05: 10 mg via INTRAVENOUS
  Filled 2015-08-05: qty 10

## 2015-08-05 MED ORDER — DILTIAZEM HCL 25 MG/5ML IV SOLN
10.0000 mg | Freq: Once | INTRAVENOUS | Status: AC
  Administered 2015-08-05: 10 mg via INTRAVENOUS
  Filled 2015-08-05: qty 5

## 2015-08-05 MED ORDER — FUROSEMIDE 10 MG/ML IJ SOLN
40.0000 mg | Freq: Once | INTRAMUSCULAR | Status: DC
  Filled 2015-08-05: qty 4

## 2015-08-05 MED ORDER — APIXABAN 5 MG PO TABS
5.0000 mg | ORAL_TABLET | Freq: Two times a day (BID) | ORAL | Status: DC
  Administered 2015-08-06 – 2015-08-11 (×12): 5 mg via ORAL
  Filled 2015-08-05 (×12): qty 1

## 2015-08-05 MED ORDER — ATORVASTATIN CALCIUM 20 MG PO TABS
20.0000 mg | ORAL_TABLET | Freq: Every day | ORAL | Status: DC
  Administered 2015-08-06 – 2015-08-11 (×6): 20 mg via ORAL
  Filled 2015-08-05 (×7): qty 1

## 2015-08-05 MED ORDER — LISINOPRIL 10 MG PO TABS
10.0000 mg | ORAL_TABLET | Freq: Every day | ORAL | Status: DC
  Administered 2015-08-06 – 2015-08-11 (×6): 10 mg via ORAL
  Filled 2015-08-05 (×6): qty 1

## 2015-08-05 MED ORDER — LORATADINE 10 MG PO TABS
10.0000 mg | ORAL_TABLET | Freq: Every day | ORAL | Status: DC | PRN
  Administered 2015-08-06 – 2015-08-08 (×3): 10 mg via ORAL
  Filled 2015-08-05 (×4): qty 1

## 2015-08-05 MED ORDER — DILTIAZEM HCL 100 MG IV SOLR
5.0000 mg/h | INTRAVENOUS | Status: DC
  Administered 2015-08-05: 5 mg/h via INTRAVENOUS
  Administered 2015-08-05: 15 mg/h via INTRAVENOUS
  Administered 2015-08-06: 20 mg/h via INTRAVENOUS
  Filled 2015-08-05 (×5): qty 100

## 2015-08-05 MED ORDER — FUROSEMIDE 10 MG/ML IJ SOLN
40.0000 mg | Freq: Once | INTRAMUSCULAR | Status: AC
  Administered 2015-08-05: 40 mg via INTRAVENOUS
  Filled 2015-08-05: qty 4

## 2015-08-05 MED ORDER — CALCIUM CARBONATE 1250 (500 CA) MG PO TABS
1.0000 | ORAL_TABLET | Freq: Two times a day (BID) | ORAL | Status: DC
  Administered 2015-08-06 – 2015-08-11 (×10): 500 mg via ORAL
  Filled 2015-08-05 (×10): qty 1

## 2015-08-05 NOTE — H&P (Signed)
History & Physical    Patient ID: Tracy Miles MRN: [id number], DOB/AGE: 1934-05-28   Admit date: 08/05/2015   Primary Physician: [name], MD Primary Cardiologist: see EPIC  Patient Profile    Pt is a 81 yo F w/ a PMH significant for CAD, paroxysmal afib, and OSA p/w DOE.   Past Medical History   Past Medical History  Diagnosis Date  . Hypertension   . Heart murmur   . Coronary artery disease   . Atrial fibrillation ([place])   . Anemia   . Arthritis   . Colon polyp   . Personal history of DVT (deep vein thrombosis)     x 2  . Hyperlipidemia   . Obesity   . Pancreatitis     post hysterectomy  . Sleep apnea   . Aortic stenosis     Very mild  . Hypercholesteremia   . OSA on CPAP   . PAF (paroxysmal atrial fibrillation) ([place])   . History of deep vein thrombophlebitis of lower extremity   . History of valvular heart disease     LEFT  . Chicken pox as a child  . Measles as a child  . Mumps child and teenager  . Allergy   . Obesity   . Unspecified constipation 11/16/2013  . Heme positive stool 11/16/2013  . DDD (degenerative disc disease) 11/16/2013  . History of DVT (deep vein thrombosis) 11/19/2013  . Unspecified sleep apnea 11/19/2013  . Allergic state 11/19/2013  . OA (osteoarthritis) 11/19/2013    S/p L TKR  . Benign paroxysmal positional vertigo 12/17/2013  . Abnormal results of thyroid function studies 12/17/2013  . Iron deficiency anemia 11/13/2013  . Personal history of colonic polyps 02/25/2014  . Spinal stenosis   . Obstructive sleep apnea 02/22/2015    Past Surgical History  Procedure Laterality Date  . Tonsilectomy, adenoidectomy, bilateral myringotomy and tubes  child  . Laminectomy  40 yrs ago  . Tubal ligation  81 years old  . Total abdominal hysterectomy  1995    had 2 tumors- benign  . Replacement total knee Left 2013  . Cyst removal hand Bilateral 20 yrs ago  . Meniscus repair Bilateral 2000 and 2010  . Appendectomy       Allergies  Allergies  Allergen Reactions  . Gabapentin Swelling  . Zebeta [Bisoprolol Fumarate] Nausea Only  . Penicillins Rash  . Sulfa Antibiotics Rash   History of Present Illness    Pt has PMH notable for CAD and afib. Last episode of afib ~2 years ago. Had DCCV. Has been maintained on verapamil and apixaban. No prior AAD or ablations. Developed URI symptoms a month ago. Has felt progressively SOB/DOE despite allergy meds and abx prescribed by PCP. Denies palpitations or heart racing. No CP. Has gained 10 lbs in past week. Legs more swollen. No PND or orthopnea. Denies f/c/ns.  Home Medications    Prior to Admission medications   Medication Sig Start Date End Date Taking? Authorizing Provider  albuterol (PROVENTIL HFA;VENTOLIN HFA) 108 (90 BASE) MCG/ACT inhaler Inhale 2 puffs into the lungs every 6 (six) hours as needed for wheezing or shortness of breath.    Historical Provider, MD  apixaban (ELIQUIS) 5 MG TABS tablet Take 1 tablet (5 mg total) by mouth 2 (two) times daily. 02/22/15   [name], MD  atorvastatin (LIPITOR) 20 MG tablet Take 1 tablet (20 mg total) by mouth daily. 02/22/15   [name], MD  bumetanide [place])  1 MG tablet TAKE 1 TABLET (1 MG TOTAL) BY MOUTH DAILY. 11/26/14   [name], MD  bumetanide (BUMEX) 1 MG tablet TAKE ONE (1) TABLET EACH DAY 08/02/15   [name], MD  calcium carbonate (OS-CAL) 600 MG TABS tablet Take 600 mg by mouth 2 (two) times daily with a meal.    Historical Provider, MD  lisinopril (PRINIVIL,ZESTRIL) 10 MG tablet Take 1 tablet (10 mg total) by mouth daily. 02/22/15   [name], MD  loratadine (CLARITIN) 10 MG tablet Take 10 mg by mouth daily as needed.     Historical Provider, MD  potassium chloride (KLOR-CON 10) 10 MEQ tablet TAKE ONE (1) TABLET EACH DAY 02/22/15   [name], MD  psyllium (REGULOID) 0.52 G capsule Take 0.52 g by mouth 2 (two) times daily.    Historical Provider, MD  verapamil (CALAN-SR) 180 MG CR  tablet Take 1 tablet (180 mg total) by mouth at bedtime. 02/22/15   [name], MD    Family History    Family History  Problem Relation Age of Onset  . Heart attack Mother 22  . Hyperlipidemia Mother     ?  Marland Kitchen Dementia Mother   . Pernicious anemia Mother   . COPD Father     smoker  . Cancer Father 105    prostate  . Heart disease Brother     quadruple bipass surgery  . Hyperlipidemia Brother   . Hypertension Brother   . Diabetes Maternal Grandmother     type 2  . Pernicious anemia Maternal Grandmother   . Colon cancer Neg Hx   . Pancreatic cancer Neg Hx   . Stomach cancer Neg Hx   . Throat cancer Neg Hx   . Liver disease Neg Hx   . Gout Son   . Atrial fibrillation Son   . Hyperlipidemia Son   . Cancer Maternal Grandfather     liver  . Cancer Paternal Grandmother     lung- doesn't think she smokes?  . Stroke Paternal Grandfather   . Cancer Son 50    non hodgin's lymphoma  . Gout Son   . Hyperlipidemia Son   . Sleep apnea Son     Social History    Social History   Social History  . Marital Status: Widowed    Spouse Name: N/A  . Number of Children: 3  . Years of Education: N/A   Occupational History  . retired Pharmacist, hospital    Social History Main Topics  . Smoking status: Never Smoker   . Smokeless tobacco: Never Used     Comment: never used tobacco  . Alcohol Use: No  . Drug Use: No  . Sexual Activity: No     Comment: lives at [place], low sodium diet   Other Topics Concern  . Not on file   Social History Narrative     Review of Systems    General:  No chills, fever, night sweats or weight changes.  Cardiovascular:  Per HPI. Dermatological: No rash, lesions/masses Respiratory: No cough, dyspnea Urologic: No hematuria, dysuria Abdominal:   No nausea, vomiting, diarrhea, bright red blood per rectum, melena, or hematemesis Neurologic:  No visual changes, wkns, changes in mental status. All other systems reviewed and are otherwise negative  except as noted above.  Physical Exam    Blood pressure 118/64, pulse 132, temperature 99.4 F (37.4 C), temperature source Oral, resp. rate 20, height 5' 2.5" (1.588 m), weight  262 lb 12.8 oz (119.205 kg), SpO2 100 %.  General: Pleasant, NAD. Psych: Normal affect. Neuro: Alert and oriented X 3. Moves all extremities spontaneously. HEENT: Normal  Neck: Supple without bruits or JVD. Lungs:  Resp regular and unlabored, CTA. Heart: RRR no s3, s4, or murmurs. Abdomen: Soft, non-tender, non-distended, BS + x 4.  Extremities: No clubbing, cyanosis or edema. DP/PT/Radials 2+ and equal bilaterally.  Labs    Troponin (Point of Care Test) No results for input(s): TROPIPOC in the last 72 hours.  Recent Labs  08/05/15 1600  TROPONINI <0.03   Lab Results  Component Value Date   WBC 7.2 08/05/2015   HGB 11.9* 08/05/2015   HCT 35.8* 08/05/2015   MCV 96.8 08/05/2015   PLT 202 08/05/2015    Recent Labs Lab 08/05/15 1600  NA 140  K 3.5  CL 105  CO2 26  BUN 18  CREATININE 1.02*  CALCIUM 9.1  PROT 6.8  BILITOT 1.0  ALKPHOS 103  ALT 17  AST 26  GLUCOSE 107*   Lab Results  Component Value Date   CHOL 172 02/22/2015   HDL 51.80 02/22/2015   LDLCALC 91 02/22/2015   TRIG 145.0 02/22/2015   No results found for: [facility]   Radiology Studies    Dg Chest 2 View  08/05/2015  CLINICAL DATA:  Shortness of breath, cough EXAM: CHEST  2 VIEW COMPARISON:  None. FINDINGS: Diffuse interstitial prominence throughout the lungs. Heart is mildly enlarged. No effusions. No acute bony abnormality. IMPRESSION: Diffuse interstitial prominence throughout the lungs which could reflect chronic interstitial disease. Chronicity is difficult to determine without old films. Edema is possible. Electronically Signed   By: Rolm Baptise M.D.   On: 08/05/2015 16:18    ECG & Cardiac Imaging    EkG - afib w/ RVR, low voltage  Assessment & Plan  Pt is a 81 yo F w/ a PMH significant for CAD, paroxysmal afib,  and OSA p/w DOE.   #Paroxysmal Afib: Multiple risk factors including morbid obesity, OSA, and HTN. Precipitated by recent URI. Rhythm control strategy has worked reasonably well. CHADS2-VASc = 5=6 siggesting 7-9% annual risk of stroke and strong indication for [place]. -admit to tele -check TFTs, TTE in AM -continue dilt gtt -NPO after midnight, DCCV w/o TEE -continue apixaban  #HFpEF: On bumex at home. Weight gain. Worsening lower extremity swelling. Difficult to assess JVP. BNP wnl but pt obese. CXR shows costophrenic angle blunting and fluid in fissures in context of normal sized heart. Summary, mild volume overload. -TTE in AM -would redose lasix in AM  #CAD -continue home meds  #OSA -continue CPAP  #FEN/GI -NPO after midnight  #PPx -on [place] -PPI not indicated  #Dispo  -pending w/u and eval  Signed, Bari Edward, MD  08/05/2015, 11:04 PM

## 2015-08-05 NOTE — ED Provider Notes (Addendum)
CSN: [id number]     Arrival date & time 08/05/15  1516 History  By signing my name below, I, Tracy Miles, attest that this documentation has been prepared under the direction and in the presence of [name], MD. Electronically Signed: [name], ED Scribe. 08/05/2015. 3:57 PM.    Chief Complaint  Patient presents with  . Cough   The history is provided by the patient. No language interpreter was used.     HPI Comments: Tracy Miles is a 81 y.o. female with hx atrial fibrillation who presents to the Emergency Department complaining of gradual onset, constant, productive cough with yellow phlegm and small amounts of blood streaks x 1 month. Pt also complains of shortness of breath, swelling to her bilateral eyes when she wakes up in the mornings, and decreased urine output for the past couple of days. Pt believes she is retaining fluids due to her symptoms. She mentions that the shortness of breath is worse during exertion. Pt also reports palpitations that she attributes to being short of breath. Her heart rate in the ED is in the 130's. She mentions that she has been in the 200's in the past. Denies fever, chills, chest pain, or any other associated symptoms. She was sent from her PCP's office where her weight was up 10 lbs.  Past Medical History  Diagnosis Date  . Hypertension   . Heart murmur   . Coronary artery disease   . Atrial fibrillation ([place])   . Anemia   . Arthritis   . Colon polyp   . Personal history of DVT (deep vein thrombosis)     x 2  . Hyperlipidemia   . Obesity   . Pancreatitis     post hysterectomy  . Sleep apnea   . Aortic stenosis     Very mild  . Hypercholesteremia   . OSA on CPAP   . PAF (paroxysmal atrial fibrillation) ([place])   . History of deep vein thrombophlebitis of lower extremity   . History of valvular heart disease     LEFT  . Chicken pox as a child  . Measles as a child  . Mumps child and teenager  . Allergy   . Obesity   .  Unspecified constipation 11/16/2013  . Heme positive stool 11/16/2013  . DDD (degenerative disc disease) 11/16/2013  . History of DVT (deep vein thrombosis) 11/19/2013  . Unspecified sleep apnea 11/19/2013  . Allergic state 11/19/2013  . OA (osteoarthritis) 11/19/2013    S/p L TKR  . Benign paroxysmal positional vertigo 12/17/2013  . Abnormal results of thyroid function studies 12/17/2013  . Iron deficiency anemia 11/13/2013  . Personal history of colonic polyps 02/25/2014  . Spinal stenosis   . Obstructive sleep apnea 02/22/2015   Past Surgical History  Procedure Laterality Date  . Tonsilectomy, adenoidectomy, bilateral myringotomy and tubes  child  . Laminectomy  40 yrs ago  . Tubal ligation  81 years old  . Total abdominal hysterectomy  1995    had 2 tumors- benign  . Replacement total knee Left 2013  . Cyst removal hand Bilateral 20 yrs ago  . Meniscus repair Bilateral 2000 and 2010  . Appendectomy     Family History  Problem Relation Age of Onset  . Heart attack Mother 25  . Hyperlipidemia Mother     ?  Marland Kitchen Dementia Mother   . Pernicious anemia Mother   . COPD Father     smoker  . Cancer Father  75    prostate  . Heart disease Brother     quadruple bipass surgery  . Hyperlipidemia Brother   . Hypertension Brother   . Diabetes Maternal Grandmother     type 2  . Pernicious anemia Maternal Grandmother   . Colon cancer Neg Hx   . Pancreatic cancer Neg Hx   . Stomach cancer Neg Hx   . Throat cancer Neg Hx   . Liver disease Neg Hx   . Gout Son   . Atrial fibrillation Son   . Hyperlipidemia Son   . Cancer Maternal Grandfather     liver  . Cancer Paternal Grandmother     lung- doesn't think she smokes?  . Stroke Paternal Grandfather   . Cancer Son 50    non hodgin's lymphoma  . Gout Son   . Hyperlipidemia Son   . Sleep apnea Son    Social History  Substance Use Topics  . Smoking status: Never Smoker   . Smokeless tobacco: Never Used     Comment: never used tobacco   . Alcohol Use: No   OB History    No data available     Review of Systems  Constitutional: Negative for fever and chills.  Respiratory: Positive for cough and shortness of breath.   Cardiovascular: Positive for palpitations. Negative for chest pain.  Genitourinary: Positive for decreased urine volume.  All other systems reviewed and are negative.   Allergies  Gabapentin; Zebeta; Penicillins; and Sulfa antibiotics  Home Medications   Prior to Admission medications   Medication Sig Start Date End Date Taking? Authorizing Provider  albuterol (PROVENTIL HFA;VENTOLIN HFA) 108 (90 BASE) MCG/ACT inhaler Inhale 2 puffs into the lungs every 6 (six) hours as needed for wheezing or shortness of breath.    Historical Provider, MD  apixaban (ELIQUIS) 5 MG TABS tablet Take 1 tablet (5 mg total) by mouth 2 (two) times daily. 02/22/15   [name], MD  atorvastatin (LIPITOR) 20 MG tablet Take 1 tablet (20 mg total) by mouth daily. 02/22/15   [name], MD  bumetanide (BUMEX) 1 MG tablet TAKE 1 TABLET (1 MG TOTAL) BY MOUTH DAILY. 11/26/14   [name], MD  bumetanide (BUMEX) 1 MG tablet TAKE ONE (1) TABLET EACH DAY 08/02/15   [name], MD  calcium carbonate (OS-CAL) 600 MG TABS tablet Take 600 mg by mouth 2 (two) times daily with a meal.    Historical Provider, MD  lisinopril (PRINIVIL,ZESTRIL) 10 MG tablet Take 1 tablet (10 mg total) by mouth daily. 02/22/15   [name], MD  loratadine (CLARITIN) 10 MG tablet Take 10 mg by mouth daily as needed.     Historical Provider, MD  potassium chloride (KLOR-CON 10) 10 MEQ tablet TAKE ONE (1) TABLET EACH DAY 02/22/15   [name], MD  psyllium (REGULOID) 0.52 G capsule Take 0.52 g by mouth 2 (two) times daily.    Historical Provider, MD  verapamil (CALAN-SR) 180 MG CR tablet Take 1 tablet (180 mg total) by mouth at bedtime. 02/22/15   [name], MD   Triage Vitals: BP 157/120 mmHg  Pulse 127  Temp(Src) 98.6 F (37 C)  Resp 20   Ht 5' 2.5" (1.588 m)  Wt 270 lb (122.471 kg)  BMI 48.57 kg/m2  SpO2 95%   Physical Exam  Constitutional: She is oriented to person, place, and time. She appears well-developed and well-nourished. No distress.  HENT:  Head: Normocephalic and  atraumatic.  Eyes: Conjunctivae and EOM are normal.  Neck: Neck supple. No tracheal deviation present.  Cardiovascular: Intact distal pulses.  An irregularly irregular rhythm present. Tachycardia present.   Pulmonary/Chest: Effort normal and breath sounds normal. No respiratory distress. She has no wheezes. She has no rhonchi. She has no rales.  Abdominal: Soft. She exhibits no distension. There is no tenderness.  Musculoskeletal: Normal range of motion. She exhibits edema (trace bilateral lower extremiites that is symmetric).  Neurological: She is alert and oriented to person, place, and time.  Skin: Skin is warm and dry.  Psychiatric: She has a normal mood and affect. Her behavior is normal.  Nursing note and vitals reviewed.   ED Course  Procedures (including critical care time)  CRITICAL CARE Performed by: Earlie Server   Total critical care time: 40 minutes  Critical care time was exclusive of separately billable procedures and treating other patients.  Critical care was necessary to treat or prevent imminent or life-threatening deterioration.  Critical care was time spent personally by me on the following activities: development of treatment plan with patient and/or surrogate as well as nursing, discussions with consultants, evaluation of patient's response to treatment, examination of patient, obtaining history from patient or surrogate, ordering and performing treatments and interventions, ordering and review of laboratory studies, ordering and review of radiographic studies, pulse oximetry and re-evaluation of patient's condition.  DIAGNOSTIC STUDIES: Oxygen Saturation is 95% on RA, normal by my interpretation.    COORDINATION OF  CARE: 3:53 PM-Discussed treatment plan which includes CXR with pt at bedside and pt agreed to plan.   Labs Review Labs Reviewed  CBC WITH DIFFERENTIAL/PLATELET - Abnormal; Notable for the following:    RBC 3.70 (*)    Hemoglobin 11.9 (*)    HCT 35.8 (*)    All other components within normal limits  COMPREHENSIVE METABOLIC PANEL - Abnormal; Notable for the following:    Glucose, Bld 107 (*)    Creatinine, Ser 1.02 (*)    GFR calc non Af Amer 51 (*)    GFR calc Af Amer 59 (*)    All other components within normal limits  PROTIME-INR - Abnormal; Notable for the following:    Prothrombin Time 16.0 (*)    All other components within normal limits  TROPONIN I  BRAIN NATRIURETIC PEPTIDE    Imaging Review Dg Chest 2 View  08/05/2015  CLINICAL DATA:  Shortness of breath, cough EXAM: CHEST  2 VIEW COMPARISON:  None. FINDINGS: Diffuse interstitial prominence throughout the lungs. Heart is mildly enlarged. No effusions. No acute bony abnormality. IMPRESSION: Diffuse interstitial prominence throughout the lungs which could reflect chronic interstitial disease. Chronicity is difficult to determine without old films. Edema is possible. Electronically Signed   By: Rolm Baptise M.D.   On: 08/05/2015 16:18   I have personally reviewed and evaluated these images and lab results as part of my medical decision-making.   EKG Interpretation   Date/Time:  Monday August 05 2015 15:41:38 EST Ventricular Rate:  143 PR Interval:    QRS Duration: 72 QT Interval:  296 QTC Calculation: 456 R Axis:   43 Text Interpretation:  Atrial fibrillation with rapid ventricular response  Low voltage QRS Abnormal ECG Atrial fibrillation with RVR has replaced  sinus rhythm Confirmed by Zavion Sleight ([id number]) on 08/05/2015 4:08:19 PM      MDM  Patient was seen and evaluated in stable condition.  Previous cardiology notes were reviewed.  Patient in atrial fibrillation with  RVR and CHF.  Cardizem given with  improvement in heart rate.  Patient desaturated when she ambulated and was placed on [place].  She was given lasix for CHF.  No sign of infection on examination.  Cough and shortness of breath appear most likely related to atrial fibrillation resulting in CHF.  Discussed with Dr. Oval Linsey on the phone who agreed with plan of care at this time and recommended patient be admitted under cardiology service for continued management.  Patient was updated on plan of care and expressed agreement.  She was admitted under the care of Dr. Oval Linsey to telemetry at [facility]   Final diagnoses:  None    1. Atrial fibrillation with RVR  2. CHF  I personally performed the services described in this documentation, which was scribed in my presence. The recorded information has been reviewed and is accurate.     [name], MD 08/06/15 [id number]  [name], MD 08/06/15 [phone]

## 2015-08-05 NOTE — ED Notes (Signed)
Called [facility] for pick up and transport

## 2015-08-05 NOTE — ED Notes (Signed)
Cough for a month. Retaining fluid and decreased urine output. Swelling under her eyes for the past couple of days. She was seen upstairs by her MD and sent here for further evaluation.

## 2015-08-05 NOTE — Progress Notes (Signed)
Pre visit review using our clinic review tool, if applicable. No additional management support is needed unless otherwise documented below in the visit note. 

## 2015-08-05 NOTE — Progress Notes (Signed)
Patient presents to clinic today c/o 1 month of cough and chest congestion. Endorses cough productive of yellow sputum. Patient was placed on Mucinex by NP at [facility] where she stays but stopped it due to fluid retention. Is using her Pro Air 3 times daily. Denies fever, chills.   Endorses significant SOB on exertion. Endorses chest tenderness with exertion. Endorses 6 pound weight gain over the past week. Denies lightheadedness or dizziness. Endorses history of GI bleed. Denies melena or hematochezia presently. Has noted BP low on home checks around 100/70.   Past Medical History  Diagnosis Date  . Hypertension   . Heart murmur   . Coronary artery disease   . Atrial fibrillation ([place])   . Anemia   . Arthritis   . Colon polyp   . Personal history of DVT (deep vein thrombosis)     x 2  . Hyperlipidemia   . Obesity   . Pancreatitis     post hysterectomy  . Sleep apnea   . Aortic stenosis     Very mild  . Hypercholesteremia   . OSA on CPAP   . PAF (paroxysmal atrial fibrillation) ([place])   . History of deep vein thrombophlebitis of lower extremity   . History of valvular heart disease     LEFT  . Chicken pox as a child  . Measles as a child  . Mumps child and teenager  . Allergy   . Obesity   . Unspecified constipation 11/16/2013  . Heme positive stool 11/16/2013  . DDD (degenerative disc disease) 11/16/2013  . History of DVT (deep vein thrombosis) 11/19/2013  . Unspecified sleep apnea 11/19/2013  . Allergic state 11/19/2013  . OA (osteoarthritis) 11/19/2013    S/p L TKR  . Benign paroxysmal positional vertigo 12/17/2013  . Abnormal results of thyroid function studies 12/17/2013  . Iron deficiency anemia 11/13/2013  . Personal history of colonic polyps 02/25/2014  . Spinal stenosis   . Obstructive sleep apnea 02/22/2015    No current facility-administered medications on file prior to visit.   Current Outpatient Prescriptions on File Prior to Visit  Medication Sig Dispense  Refill  . apixaban (ELIQUIS) 5 MG TABS tablet Take 1 tablet (5 mg total) by mouth 2 (two) times daily. 180 tablet 1  . atorvastatin (LIPITOR) 20 MG tablet Take 1 tablet (20 mg total) by mouth daily. 90 tablet 2  . bumetanide (BUMEX) 1 MG tablet TAKE 1 TABLET (1 MG TOTAL) BY MOUTH DAILY. 90 tablet 0  . bumetanide (BUMEX) 1 MG tablet TAKE ONE (1) TABLET EACH DAY 90 tablet 0  . calcium carbonate (OS-CAL) 600 MG TABS tablet Take 600 mg by mouth 2 (two) times daily with a meal.    . lisinopril (PRINIVIL,ZESTRIL) 10 MG tablet Take 1 tablet (10 mg total) by mouth daily. 90 tablet 2  . loratadine (CLARITIN) 10 MG tablet Take 10 mg by mouth daily as needed.     . potassium chloride (KLOR-CON 10) 10 MEQ tablet TAKE ONE (1) TABLET EACH DAY 90 tablet 2  . psyllium (REGULOID) 0.52 G capsule Take 0.52 g by mouth 2 (two) times daily.    . verapamil (CALAN-SR) 180 MG CR tablet Take 1 tablet (180 mg total) by mouth at bedtime. 90 tablet 2    Allergies  Allergen Reactions  . Gabapentin Swelling  . Zebeta [Bisoprolol Fumarate] Nausea Only  . Penicillins Rash  . Sulfa Antibiotics Rash    Family History  Problem Relation Age  of Onset  . Heart attack Mother 38  . Hyperlipidemia Mother     ?  Marland Kitchen Dementia Mother   . Pernicious anemia Mother   . COPD Father     smoker  . Cancer Father 78    prostate  . Heart disease Brother     quadruple bipass surgery  . Hyperlipidemia Brother   . Hypertension Brother   . Diabetes Maternal Grandmother     type 2  . Pernicious anemia Maternal Grandmother   . Colon cancer Neg Hx   . Pancreatic cancer Neg Hx   . Stomach cancer Neg Hx   . Throat cancer Neg Hx   . Liver disease Neg Hx   . Gout Son   . Atrial fibrillation Son   . Hyperlipidemia Son   . Cancer Maternal Grandfather     liver  . Cancer Paternal Grandmother     lung- doesn't think she smokes?  . Stroke Paternal Grandfather   . Cancer Son 50    non hodgin's lymphoma  . Gout Son   . Hyperlipidemia  Son   . Sleep apnea Son     Social History   Social History  . Marital Status: Widowed    Spouse Name: N/A  . Number of Children: 3  . Years of Education: N/A   Occupational History  . retired Pharmacist, hospital    Social History Main Topics  . Smoking status: Never Smoker   . Smokeless tobacco: Never Used     Comment: never used tobacco  . Alcohol Use: No  . Drug Use: No  . Sexual Activity: No     Comment: lives at [place], low sodium diet   Other Topics Concern  . None   Social History Narrative   Review of Systems - See HPI.  All other ROS are negative.  BP 119/80 mmHg  Pulse 152  Temp(Src) 98 F (36.7 C) (Oral)  Ht 5\' 4"  (1.626 m)  Wt 269 lb 6.4 oz (122.199 kg)  BMI 46.22 kg/m2  SpO2 94%  Physical Exam  Constitutional: She is oriented to person, place, and time and well-developed, well-nourished, and in no distress.  HENT:  Head: Normocephalic and atraumatic.  Eyes: Conjunctivae are normal.  Neck: Neck supple.  Cardiovascular: An irregularly irregular rhythm present. Tachycardia present.   Pulmonary/Chest: Effort normal and breath sounds normal. No respiratory distress. She has no wheezes. She has no rales. She exhibits no tenderness.  Neurological: She is alert and oriented to person, place, and time.  Skin: Skin is warm and dry. No rash noted.  Psychiatric: Affect normal.    Assessment/Plan: SOBOE (shortness of breath on exertion) Patient's original complaints consistent with URI. However mention of SOB with minimal exertion and chest pain coupled with history of a. Fib and tachycardia today, needs more emergent assessment. EKG revealed a. Fib but no sign of STEMI or other acute changes. Patient sent to ER after MD consulted.

## 2015-08-05 NOTE — ED Notes (Signed)
Pt up to Bathroom. Noticed increase in WOB with exertion. Placed pt back in room and Sp02 85% HR 145, RR 28. Placed on 2lpm [place]. VS stable.

## 2015-08-05 NOTE — ED Notes (Signed)
Attempted report, RN to call back

## 2015-08-05 NOTE — Assessment & Plan Note (Signed)
Patient's original complaints consistent with URI. However mention of SOB with minimal exertion and chest pain coupled with history of a. Fib and tachycardia today, needs more emergent assessment. EKG revealed a. Fib but no sign of STEMI or other acute changes. Patient sent to ER after MD consulted.

## 2015-08-06 ENCOUNTER — Inpatient Hospital Stay (HOSPITAL_COMMUNITY): Payer: Medicare Other

## 2015-08-06 DIAGNOSIS — G4730 Sleep apnea, unspecified: Secondary | ICD-10-CM | POA: Diagnosis present

## 2015-08-06 DIAGNOSIS — I10 Essential (primary) hypertension: Secondary | ICD-10-CM

## 2015-08-06 DIAGNOSIS — I4891 Unspecified atrial fibrillation: Secondary | ICD-10-CM

## 2015-08-06 LAB — PROTIME-INR
INR: 1.55 — ABNORMAL HIGH (ref 0.00–1.49)
Prothrombin Time: 18.6 seconds — ABNORMAL HIGH (ref 11.6–15.2)

## 2015-08-06 LAB — CBC
HCT: 36.1 % (ref 36.0–46.0)
Hemoglobin: 11.7 g/dL — ABNORMAL LOW (ref 12.0–15.0)
MCH: 31.3 pg (ref 26.0–34.0)
MCHC: 32.4 g/dL (ref 30.0–36.0)
MCV: 96.5 fL (ref 78.0–100.0)
PLATELETS: 205 10*3/uL (ref 150–400)
RBC: 3.74 MIL/uL — ABNORMAL LOW (ref 3.87–5.11)
RDW: 12.9 % (ref 11.5–15.5)
WBC: 6.9 10*3/uL (ref 4.0–10.5)

## 2015-08-06 LAB — TSH: TSH: 1.898 u[IU]/mL (ref 0.350–4.500)

## 2015-08-06 LAB — BASIC METABOLIC PANEL
ANION GAP: 9 (ref 5–15)
Anion gap: 9 (ref 5–15)
BUN: 14 mg/dL (ref 6–20)
BUN: 20 mg/dL (ref 6–20)
CALCIUM: 8.8 mg/dL — AB (ref 8.9–10.3)
CHLORIDE: 102 mmol/L (ref 101–111)
CO2: 29 mmol/L (ref 22–32)
CO2: 26 mmol/L (ref 22–32)
CREATININE: 1.19 mg/dL — AB (ref 0.44–1.00)
Calcium: 8.7 mg/dL — ABNORMAL LOW (ref 8.9–10.3)
Chloride: 103 mmol/L (ref 101–111)
Creatinine, Ser: 1.16 mg/dL — ABNORMAL HIGH (ref 0.44–1.00)
GFR calc Af Amer: 49 mL/min — ABNORMAL LOW (ref >60)
GFR calc Af Amer: 51 mL/min — ABNORMAL LOW (ref >60)
GFR calc non Af Amer: 42 mL/min — ABNORMAL LOW (ref >60)
GFR, EST NON AFRICAN AMERICAN: 44 mL/min — AB (ref >60)
GLUCOSE: 107 mg/dL — AB (ref 65–99)
GLUCOSE: 135 mg/dL — AB (ref 65–99)
POTASSIUM: 3.7 mmol/L (ref 3.5–5.1)
Potassium: 3.3 mmol/L — ABNORMAL LOW (ref 3.5–5.1)
SODIUM: 140 mmol/L (ref 135–145)
Sodium: 138 mmol/L (ref 135–145)

## 2015-08-06 LAB — MAGNESIUM: Magnesium: 1.9 mg/dL (ref 1.7–2.4)

## 2015-08-06 MED ORDER — SODIUM CHLORIDE 0.9 % IJ SOLN: 3.0000 mL | INTRAMUSCULAR | Status: DC | PRN

## 2015-08-06 MED ORDER — POTASSIUM CHLORIDE CRYS ER 20 MEQ PO TBCR
40.0000 meq | EXTENDED_RELEASE_TABLET | Freq: Two times a day (BID) | ORAL | Status: AC
  Administered 2015-08-06 (×2): 40 meq via ORAL
  Filled 2015-08-06 (×2): qty 2

## 2015-08-06 MED ORDER — DOFETILIDE 500 MCG PO CAPS
500.0000 ug | ORAL_CAPSULE | Freq: Two times a day (BID) | ORAL | Status: DC
  Administered 2015-08-07 – 2015-08-09 (×6): 500 ug via ORAL
  Filled 2015-08-06 (×6): qty 1

## 2015-08-06 MED ORDER — SODIUM CHLORIDE 0.9 % IV SOLN
250.0000 mL | INTRAVENOUS | Status: DC | PRN
Start: 2015-08-06 — End: 2015-08-11
  Administered 2015-08-09: 12:00:00 via INTRAVENOUS

## 2015-08-06 MED ORDER — POTASSIUM CHLORIDE CRYS ER 20 MEQ PO TBCR
40.0000 meq | EXTENDED_RELEASE_TABLET | Freq: Once | ORAL | Status: AC
  Administered 2015-08-06: 40 meq via ORAL
  Filled 2015-08-06: qty 2

## 2015-08-06 MED ORDER — ACETAMINOPHEN 325 MG PO TABS
650.0000 mg | ORAL_TABLET | Freq: Four times a day (QID) | ORAL | Status: DC | PRN
  Administered 2015-08-06 – 2015-08-10 (×2): 650 mg via ORAL
  Filled 2015-08-06 (×2): qty 2

## 2015-08-06 MED ORDER — SODIUM CHLORIDE 0.9 % IJ SOLN
3.0000 mL | Freq: Two times a day (BID) | INTRAMUSCULAR | Status: DC
  Administered 2015-08-06 – 2015-08-10 (×8): 3 mL via INTRAVENOUS

## 2015-08-06 NOTE — Plan of Care (Signed)
Problem: Nutrition: Goal: Adequate nutrition will be maintained Outcome: Completed/Met Date Met:  08/06/15 Pt educated on nutrition and heart healthy diet. Pt verbalized understanding.

## 2015-08-06 NOTE — Progress Notes (Signed)
Utilization review completed. Shanara Schnieders, RN, BSN. 

## 2015-08-06 NOTE — Plan of Care (Signed)
Problem: Safety: Goal: Ability to remain free from injury will improve Outcome: Completed/Met Date Met:  08/06/15 Pt educated on safety measures put into place. Pt verbalized understanding.

## 2015-08-06 NOTE — Progress Notes (Signed)
Pt BP dropped to 89/62. Cardizem drip was stopped. Cardiology notified. Will continue to monitor.   Ruben Reason, RN

## 2015-08-06 NOTE — Clinical Social Work Note (Signed)
Clinical Social Worker received referral for patient being a resident at [facility].  Since patient is living independently, patient will discharge "home" at discharge.  Please reconsult CSW if patient is need of ALF and/or SNF placement at discharge.  Barbette Or, [place]

## 2015-08-06 NOTE — Discharge Instructions (Addendum)
Information on my medicine - ELIQUIS (apixaban)  This medication education was reviewed with me or my healthcare representative as part of my discharge preparation.  The pharmacist that spoke with me during my hospital stay was:  Romona Curls, [facility]  Why was Eliquis prescribed for you? Eliquis was prescribed for you to reduce the risk of a blood clot forming that can cause a stroke if you have a medical condition called atrial fibrillation (a type of irregular heartbeat).  What do You need to know about Eliquis ? Take your Eliquis TWICE DAILY - one tablet in the morning and one tablet in the evening with or without food. If you have difficulty swallowing the tablet whole please discuss with your pharmacist how to take the medication safely.  Take Eliquis exactly as prescribed by your doctor and DO NOT stop taking Eliquis without talking to the doctor who prescribed the medication.  Stopping may increase your risk of developing a stroke.  Refill your prescription before you run out.  After discharge, you should have regular check-up appointments with your healthcare provider that is prescribing your Eliquis.  In the future your dose may need to be changed if your kidney function or weight changes by a significant amount or as you get older.  What do you do if you miss a dose? If you miss a dose, take it as soon as you remember on the same day and resume taking twice daily.  Do not take more than one dose of ELIQUIS at the same time to make up a missed dose.  Important Safety Information A possible side effect of Eliquis is bleeding. You should call your healthcare provider right away if you experience any of the following: ? Bleeding from an injury or your nose that does not stop. ? Unusual colored urine (red or dark brown) or unusual colored stools (red or black). ? Unusual bruising for unknown reasons. ? A serious fall or if you hit your head (even if there is no bleeding).  Some  medicines may interact with Eliquis and might increase your risk of bleeding or clotting while on Eliquis. To help avoid this, consult your healthcare provider or pharmacist prior to using any new prescription or non-prescription medications, including herbals, vitamins, non-steroidal anti-inflammatory drugs (NSAIDs) and supplements.  This website has more information on Eliquis (apixaban): http://www.eliquis.com/eliquis/home  Atrial Fibrillation Atrial fibrillation is a type of irregular or rapid heartbeat (arrhythmia). In atrial fibrillation, the heart quivers continuously in a chaotic pattern. This occurs when parts of the heart receive disorganized signals that make the heart unable to pump blood normally. This can increase the risk for stroke, heart failure, and other heart-related conditions. There are different types of atrial fibrillation, including:  Paroxysmal atrial fibrillation. This type starts suddenly, and it usually stops on its own shortly after it starts.  Persistent atrial fibrillation. This type often lasts longer than a week. It may stop on its own or with treatment.  Long-lasting persistent atrial fibrillation. This type lasts longer than 12 months.  Permanent atrial fibrillation. This type does not go away. Talk with your health care provider to learn about the type of atrial fibrillation that you have. CAUSES This condition is caused by some heart-related conditions or procedures, including:  A heart attack.  Coronary artery disease.  Heart failure.  Heart valve conditions.  High blood pressure.  Inflammation of the sac that surrounds the heart (pericarditis).  Heart surgery.  Certain heart rhythm disorders, such as Wolf-Parkinson-White  syndrome. Other causes include:  Pneumonia.  Obstructive sleep apnea.  Blockage of an artery in the lungs (pulmonary embolism, or PE).  Lung cancer.  Chronic lung disease.  Thyroid problems, especially if the  thyroid is overactive (hyperthyroidism).  Caffeine.  Excessive alcohol use or illegal drug use.  Use of some medicines, including certain decongestants and diet pills. Sometimes, the cause cannot be found. RISK FACTORS This condition is more likely to develop in:  People who are older in age.  People who smoke.  People who have diabetes mellitus.  People who are overweight (obese).  Athletes who exercise vigorously. SYMPTOMS Symptoms of this condition include:  A feeling that your heart is beating rapidly or irregularly.  A feeling of discomfort or pain in your chest.  Shortness of breath.  Sudden light-headedness or weakness.  Getting tired easily during exercise. In some cases, there are no symptoms. DIAGNOSIS Your health care provider may be able to detect atrial fibrillation when taking your pulse. If detected, this condition may be diagnosed with:  An electrocardiogram (ECG).  A Holter monitor test that records your heartbeat patterns over a 24-hour period.  Transthoracic echocardiogram (TTE) to evaluate how blood flows through your heart.  Transesophageal echocardiogram (TEE) to view more detailed images of your heart.  A stress test.  Imaging tests, such as a CT scan or chest X-ray.  Blood tests. TREATMENT The main goals of treatment are to prevent blood clots from forming and to keep your heart beating at a normal rate and rhythm. The type of treatment that you receive depends on many factors, such as your underlying medical conditions and how you feel when you are experiencing atrial fibrillation. This condition may be treated with:  Medicine to slow down the heart rate, bring the heart's rhythm back to normal, or prevent clots from forming.  Electrical cardioversion. This is a procedure that resets your heart's rhythm by delivering a controlled, low-energy shock to the heart through your skin.  Different types of ablation, such as catheter ablation,  catheter ablation with pacemaker, or surgical ablation. These procedures destroy the heart tissues that send abnormal signals. When the pacemaker is used, it is placed under your skin to help your heart beat in a regular rhythm. HOME CARE INSTRUCTIONS  Take over-the counter and prescription medicines only as told by your health care provider.  If your health care provider prescribed a blood-thinning medicine (anticoagulant), take it exactly as told. Taking too much blood-thinning medicine can cause bleeding. If you do not take enough blood-thinning medicine, you will not have the protection that you need against stroke and other problems.  Do not use tobacco products, including cigarettes, chewing tobacco, and e-cigarettes. If you need help quitting, ask your health care provider.  If you have obstructive sleep apnea, manage your condition as told by your health care provider.  Do not drink alcohol.  Do not drink beverages that contain caffeine, such as coffee, soda, and tea.  Maintain a healthy weight. Do not use diet pills unless your health care provider approves. Diet pills may make heart problems worse.  Follow diet instructions as told by your health care provider.  Exercise regularly as told by your health care provider.  Keep all follow-up visits as told by your health care provider. This is important. PREVENTION  Avoid drinking beverages that contain caffeine or alcohol.  Avoid certain medicines, especially medicines that are used for breathing problems.  Avoid certain herbs and herbal medicines, such as those  that contain ephedra or ginseng.  Do not use illegal drugs, such as cocaine and amphetamines.  Do not smoke.  Manage your high blood pressure. SEEK MEDICAL CARE IF:  You notice a change in the rate, rhythm, or strength of your heartbeat.  You are taking an anticoagulant and you notice increased bruising.  You tire more easily when you exercise or exert  yourself. SEEK IMMEDIATE MEDICAL CARE IF:  You have chest pain, abdominal pain, sweating, or weakness.  You feel nauseous.  You notice blood in your vomit, bowel movement, or urine.  You have shortness of breath.  You suddenly have swollen feet and ankles.  You feel dizzy.  You have sudden weakness or numbness of the face, arm, or leg, especially on one side of the body.  You have trouble speaking, trouble understanding, or both (aphasia).  Your face or your eyelid droops on one side. These symptoms may represent a serious problem that is an emergency. Do not wait to see if the symptoms will go away. Get medical help right away. Call your local emergency services (911 in the [place] Do not drive yourself to the hospital.   This information is not intended to replace advice given to you by your health care provider. Make sure you discuss any questions you have with your health care provider.   Document Released: 08/17/2005 Document Revised: 05/08/2015 Document Reviewed: 12/12/2014 Elsevier Interactive Patient Education 2016 [place] have an appointment set up with the [facility].  Multiple studies have shown that being followed by a dedicated atrial fibrillation clinic in addition to the standard care you receive from your other physicians improves health. We believe that enrollment in the atrial fibrillation clinic will allow [place] to better care for you.   The phone number to the [facility] is [phone]. The clinic is staffed Monday through Friday from 8:30am to 5pm.  Parking Directions: The clinic is located in the Heart and Vascular Building connected to [facility]. 1)From [address] turn on to [address] and go to the 3rd entrance  (Heart and Vascular entrance) on the right. 2)Look to the right for Heart &Vascular Parking Garage.     + 3)A code for the entrance is required please call the clinic to receive  this.   4)Take the elevators to the 1st floor. Registration is in the room with the glass walls at the end of the hallway.  If you have any trouble parking or locating the clinic, please dont hesitate to call [phone].     No HCTZ (hydrocholrathiazide) or VERAPAMIL while taking Tikosyn  You will have lab work and EKG at your appointment on Friday.   Heart Healthy Diet

## 2015-08-06 NOTE — Plan of Care (Signed)
Problem: Health Behavior/Discharge Planning: Goal: Ability to manage health-related needs will improve Outcome: Completed/Met Date Met:  08/06/15 Educated pt on compliance with medications and treatment plan. Pt verbalized understanding.

## 2015-08-06 NOTE — Progress Notes (Signed)
  Echocardiogram 2D Echocardiogram has been performed.  Jennette Dubin 08/06/2015, 10:41 AM

## 2015-08-06 NOTE — Progress Notes (Signed)
Pharmacy Review for Dofetilide (Tikosyn) Initiation  Admit Complaint: 81 y.o. female admitted 08/05/2015 with atrial fibrillation to be initiated on dofetilide.   Assessment:  Patient Exclusion Criteria: If any screening criteria checked as "Yes", then  patient  should NOT receive dofetilide until criteria item is corrected. If "Yes" please indicate correction plan.  YES  NO Patient  Exclusion Criteria Correction Plan  [x]  []  Baseline QTc interval is greater than or equal to 440 msec. IF above YES box checked dofetilide contraindicated unless patient has ICD; then may proceed if QTc 500-550 msec or with known ventricular conduction abnormalities may proceed with QTc 550-600 msec. QTc = 456 ms on 12/5, today's EKG pending F/u EKG in AM  []  [x]  Magnesium level is less than 1.8 mEq/l : Last magnesium:  Lab Results  Component Value Date   MG 1.9 08/06/2015        [x]  []  Potassium level is less than 4 mEq/l : Last potassium:  Lab Results  Component Value Date   K 3.7 08/06/2015       40 mEq x 1  []  [x]  Patient is known or suspected to have a digoxin level greater than 2 ng/ml: No results found for: DIGOXIN    []  [x]  Creatinine clearance less than 20 ml/min (calculated using Cockcroft-Gault, actual body weight and serum creatinine): Estimated Creatinine Clearance: 47.3 mL/min (by C-G formula based on Cr of 1.19). CrCL > 60 ml/min using TBW   []  [x]  Patient has received drugs known to prolong the QT intervals within the last 48 hours (phenothiazines, tricyclics or tetracyclic antidepressants, erythromycin, H-1 antihistamines, cisapride, fluoroquinolones, azithromycin). Drugs not listed above may have an, as yet, undetected potential to prolong the QT interval, updated information on QT prolonging agents is available at this website:QT prolonging agents   []  [x]  Patient received a dose of hydrochlorothiazide (Oretic) alone or in any combination including triamterene (Dyazide, Maxzide) in the  last 48 hours.   [x]  []  Patient received a medication known to increase dofetilide plasma concentrations prior to initial dofetilide dose:  . Trimethoprim (Primsol, Proloprim) in the last 36 hours . Verapamil (Calan, Verelan) in the last 36 hours or a sustained release dose in the last 72 hours . Megestrol (Megace) in the last 5 days  . Cimetidine (Tagamet) in the last 6 hours . Ketoconazole (Nizoral) in the last 24 hours . Itraconazole (Sporanox) in the last 48 hours  . Prochlorperazine (Compazine) in the last 36 hours  Calan (verapamil) d/c'ed, last dose 12/5  []  [x]  Patient is known to have a history of torsades de pointes; congenital or acquired long QT syndromes.   []  [x]  Patient has received a Class 1 antiarrhythmic with less than 2 half-lives since last dose. (Disopyramide, Quinidine, Procainamide, Lidocaine, Mexiletine, Flecainide, Propafenone)   []  [x]  Patient has received amiodarone therapy in the past 3 months or amiodarone level is greater than 0.3 ng/ml.    Patient has been appropriately anticoagulated with Eliquis.  Ordering provider was confirmed at [email or web address] if they are not listed on the [place] Prescribers list.  Goal of Therapy: Follow renal function, electrolytes, potential drug interactions, and dose adjustment. Provide education and 1 week supply at discharge.  Plan:  [x]   Physician selected initial dose within range recommended for patients level of renal function - will monitor for response.  []   Physician selected initial dose outside of range recommended for patients level of renal function - will discuss if the dose should be altered at  this time.   Select One Calculated CrCl  Dose q12h  [x]  > 60 ml/min 500 mcg  []  40-60 ml/min 250 mcg  []  20-40 ml/min 125 mcg   2. Follow up QTc after the first 5 doses, renal function, electrolytes (K & Mg) daily x 3     days, dose adjustment, success of initiation and facilitate 1 week discharge supply as      clinically indicated.  3. Initiate Tikosyn education video (Call [phone] and ask for video # 116).  4. Place Enrollment Form on the chart for discharge supply of dofetilide.   Plan: - Start Tikosyn in AM while awaiting clearance of verapamil - F/U QTc, lytes in AM     Traven Davids D. Mina Marble, PharmD, BCPS Pager:  [phone] 08/06/2015, 5:46 PM

## 2015-08-06 NOTE — Progress Notes (Addendum)
       Patient Name: Tracy Miles Date of Encounter: 08/06/2015    SUBJECTIVE: Seen in past by Dr. Rayann Heman. Available data has been reviewed. On at least one prior occasion cardioversion without antiarrhythmic drug coverage lead to early failure within 24 hours. Subsequent loading with amiodarone and repeat cardioversion was successful. Recently amiodarone was discontinued. Now presents with volume overload, evidence of acute diastolic heart failure, and recurrent atrial fibrillation with rapid ventricular response. Additionally, blood pressures are soft.  TELEMETRY:  Atrial fibrillation rapid ventricular response. Filed Vitals:   08/06/15 0653 08/06/15 0751 08/06/15 0849 08/06/15 1149  BP: 100/77 95/57 108/59   Pulse:  109    Temp:  99 F (37.2 C)  98.8 F (37.1 C)  TempSrc:  Oral  Oral  Resp:  20 20   Height:      Weight:      SpO2:  91%      Intake/Output Summary (Last 24 hours) at 08/06/15 1234 Last data filed at 08/06/15 0500  Gross per 24 hour  Intake      0 ml  Output   1500 ml  Net  -1500 ml   LABS: Basic Metabolic Panel:  Recent Labs  08/05/15 1600 08/06/15 0357  NA 140 140  K 3.5 3.3*  CL 105 102  CO2 26 29  GLUCOSE 107* 107*  BUN 18 14  CREATININE 1.02* 1.19*  CALCIUM 9.1 8.7*   CBC:  Recent Labs  08/05/15 1600 08/06/15 0357  WBC 7.2 6.9  NEUTROABS 5.0  --   HGB 11.9* 11.7*  HCT 35.8* 36.1  MCV 96.8 96.5  PLT 202 205   Cardiac Enzymes:  Recent Labs  08/05/15 1600  TROPONINI <0.03   Echocardiogram: 08/06/15 Study Conclusions  - Left ventricle: The cavity size was normal. Wall thickness was normal. Systolic function was normal. The estimated ejection fraction was in the range of 55% to 60%. Wall motion was normal; there were no regional wall motion abnormalities. - Mitral valve: Calcified annulus. There was mild regurgitation. Valve area by continuity equation (using LVOT flow): 1.63 cm^2. - Left atrium: The atrium was  mildly dilated.  Radiology/Studies:  Chest x-ray demonstrates increase in interstitial markings, representing either fluid or interstitial process.  Physical Exam: Blood pressure 108/59, pulse 109, temperature 98.8 F (37.1 C), temperature source Oral, resp. rate 20, height 5' 2.5" (1.588 m), weight 261 lb 6.4 oz (118.57 kg), SpO2 91 %. Weight change:   Wt Readings from Last 3 Encounters:  08/06/15 261 lb 6.4 oz (118.57 kg)  08/05/15 269 lb 6.4 oz (122.199 kg)  06/20/15 263 lb (119.296 kg)   Obese Comfortable without dyspnea Irregularly irregular rhythm without murmur or gallop  ASSESSMENT:  1. Recurrent atrial fibrillation with superimposed acute diastolic heart failure. Prior history of atrial fib requiring amiodarone therapy to achieve successful cardioversion.  2. Relatively low blood pressures on current therapy to control rate.  3. Chronic anticoagulation therapy, although prior history of intestinal bleeding on Coumadin and Xarelto. Currently on Eliquis  Plan:  I have asked for EP input.  Have decided to forego attempts at cardioversion today until we establish a strategy for long-term management. If we decide in favor of cardioversion, should we load with an antiarrhythmic first?  Continue IV diltiazem for now. Demetrios Isaacs 08/06/2015, 12:34 PM

## 2015-08-06 NOTE — Plan of Care (Signed)
Problem: Activity: Goal: Risk for activity intolerance will decrease Outcome: Completed/Met Date Met:  08/06/15 Pt tolerating activity well. Pt ambulating around the room. Pt has no complaints at th is time.

## 2015-08-06 NOTE — Plan of Care (Signed)
Problem: Education: Goal: Knowledge of [facility] Education information/materials will improve Outcome: Completed/Met Date Met:  08/06/15 Pt educated on pain scale, discharge needs, medications, and plan of care. Pt verbalized understanding.

## 2015-08-06 NOTE — Consult Note (Signed)
ELECTROPHYSIOLOGY CONSULT NOTE    Patient ID: Tracy Miles MRN: [id number], DOB/AGE: [birth year] 81 y.o.  Admit date: 08/05/2015 Date of Consult: 08/06/2015  Primary Physician: [name], MD Primary Cardiologist: Dr. Meda Coffee Primary electrophysiologist: Dr. Rayann Heman  Reason for Consultation: Atrial fibrillation  HPI: Tracy Miles is a 81 y.o. female with PMHx of PAFib, historically with bleeding in Xarelto but doing well with Eliquis, HTN, arthritis, CBP/spinal stenosis, and OSA compliant on CPAP, recurrent DVTs historically, chart mentions CAD but the patient denies this,  comes in to [facility] with increasing SOB and generalized weakness, admitted with fluid OL and rapid AF, and is on a cardizem gtt and her eliquis.  At home she was on Verapamil  She reports a sore throat and cough/cold type symptoms about 2 weeks ago treated by the clinic in her housing community for bronchitis with inhalers and mucinex, developed urinary retention and stopped, the cough persisted, but was able yo urinate normally.  She has had about a week of progressive fatigue, and DOE, no CP.  She states she though she was in Afib, her last event about 2 years ago and similar symptoms.  Her PAF history includes being on Amiodarone which did well maintaining SR though stopped due to patient concerns of long term use and potential toxicity issues, stopped Oct 2015, she visited the AF clinic in January and was in [place] and doing well, but reported at that time being told by her eye doctor she had some degree corneal "scarring"felt secondary to the [facility] and did reported that  She did not ever want to use it again.  Notes also state she had not wanted to pursue ablation strategies. 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  She recalls Zebeta mad her feel terrible, can not recall exactly what her symptom was.  She  mentions remotely on Coumadin unable to control INRs, and GIB with Xarelto.    At this time she feels improved, but not to her baseline, feels palpitations, no CP, c/w cough, no rest SOB.  She only c/o a headache at this time.   Past Medical History  Diagnosis Date  . Hypertension   . Heart murmur   . Coronary artery disease   . Atrial fibrillation ([place])   . Anemia   . Arthritis   . Colon polyp   . Personal history of DVT (deep vein thrombosis) X 2    "left"  . Hyperlipidemia   . Pancreatitis     post hysterectomy  . Sleep apnea   . Aortic stenosis     Very mild  . Hypercholesteremia   . OSA on CPAP   . PAF (paroxysmal atrial fibrillation) ([place])   . History of valvular heart disease     LEFT  . Chicken pox as a child  . Measles as a child  . Mumps child and teenager  . Allergy   . Obesity   . Unspecified constipation 11/16/2013  . Heme positive stool 11/16/2013  . DDD (degenerative disc disease) 11/16/2013  . Unspecified sleep apnea 11/19/2013  . Allergic state 11/19/2013  . OA (osteoarthritis) 11/19/2013    S/p L TKR  . Benign paroxysmal positional vertigo 12/17/2013  . Abnormal results of thyroid function studies 12/17/2013  . Iron deficiency anemia 11/13/2013  . Personal history of colonic polyps 02/25/2014  . Spinal stenosis   .  Obstructive sleep apnea 02/22/2015     Surgical History:  Past Surgical History  Procedure Laterality Date  . Tonsilectomy, adenoidectomy, bilateral myringotomy and tubes  child  . Lumbar laminectomy  42 yrs ago    "L3-4"  . Tubal ligation  81 years old  . Total abdominal hysterectomy  1995    had 2 tumors- benign  . Replacement total knee Left 2013  . Cyst removal hand Bilateral 1990's    "played to much golf"  . Meniscus repair Bilateral 2000 and 2010  . Appendectomy    . Tonsillectomy and adenoidectomy    . Joint replacement    . Back surgery    . Cataract extraction w/ intraocular lens  implant, bilateral Bilateral 2016  . Dilation  and curettage of uterus       Prescriptions prior to admission  Medication Sig Dispense Refill Last Dose  . albuterol (PROVENTIL HFA;VENTOLIN HFA) 108 (90 BASE) MCG/ACT inhaler Inhale 2 puffs into the lungs every 6 (six) hours as needed for wheezing or shortness of breath.   Taking  . apixaban (ELIQUIS) 5 MG TABS tablet Take 1 tablet (5 mg total) by mouth 2 (two) times daily. 180 tablet 1 Taking  . atorvastatin (LIPITOR) 20 MG tablet Take 1 tablet (20 mg total) by mouth daily. 90 tablet 2 Taking  . bumetanide (BUMEX) 1 MG tablet TAKE 1 TABLET (1 MG TOTAL) BY MOUTH DAILY. 90 tablet 0 Taking  . bumetanide (BUMEX) 1 MG tablet TAKE ONE (1) TABLET EACH DAY 90 tablet 0 Taking  . calcium carbonate (OS-CAL) 600 MG TABS tablet Take 600 mg by mouth 2 (two) times daily with a meal.   Taking  . lisinopril (PRINIVIL,ZESTRIL) 10 MG tablet Take 1 tablet (10 mg total) by mouth daily. 90 tablet 2 Taking  . loratadine (CLARITIN) 10 MG tablet Take 10 mg by mouth daily as needed.    Taking  . potassium chloride (KLOR-CON 10) 10 MEQ tablet TAKE ONE (1) TABLET EACH DAY 90 tablet 2 Taking  . psyllium (REGULOID) 0.52 G capsule Take 0.52 g by mouth 2 (two) times daily.   Taking  . verapamil (CALAN-SR) 180 MG CR tablet Take 1 tablet (180 mg total) by mouth at bedtime. 90 tablet 2 Taking    Inpatient Medications:  . apixaban  5 mg Oral BID  . atorvastatin  20 mg Oral Daily  . calcium carbonate  1 tablet Oral BID WC  . furosemide  40 mg Intravenous Once  . lisinopril  10 mg Oral Daily  . psyllium  1 packet Oral BID    Allergies:  Allergies  Allergen Reactions  . Gabapentin Swelling  . Zebeta [Bisoprolol Fumarate] Nausea Only  . Penicillins Rash  . Sulfa Antibiotics Rash    Social History   Social History  . Marital Status: Widowed    Spouse Name: N/A  . Number of Children: 3  . Years of Education: N/A   Occupational History  . retired Pharmacist, hospital    Social History Main Topics  . Smoking status: Never  Smoker   . Smokeless tobacco: Never Used     Comment: never used tobacco  . Alcohol Use: No  . Drug Use: No  . Sexual Activity: No     Comment: lives at [place], low sodium diet   Other Topics Concern  . Not on file   Social History Narrative     Family History  Problem Relation Age of Onset  . Heart attack Mother  44  . Hyperlipidemia Mother     ?  Marland Kitchen Dementia Mother   . Pernicious anemia Mother   . COPD Father     smoker  . Cancer Father 85    prostate  . Heart disease Brother     quadruple bipass surgery  . Hyperlipidemia Brother   . Hypertension Brother   . Diabetes Maternal Grandmother     type 2  . Pernicious anemia Maternal Grandmother   . Colon cancer Neg Hx   . Pancreatic cancer Neg Hx   . Stomach cancer Neg Hx   . Throat cancer Neg Hx   . Liver disease Neg Hx   . Gout Son   . Atrial fibrillation Son   . Hyperlipidemia Son   . Cancer Maternal Grandfather     liver  . Cancer Paternal Grandmother     lung- doesn't think she smokes?  . Stroke Paternal Grandfather   . Cancer Son 50    non hodgin's lymphoma  . Gout Son   . Hyperlipidemia Son   . Sleep apnea Son      Review of Systems: All other systems reviewed and are otherwise negative except as noted above.  Physical Exam: Filed Vitals:   08/06/15 0500 08/06/15 0653 08/06/15 0751 08/06/15 0849  BP:  100/77 95/57 108/59  Pulse:   109   Temp:   99 F (37.2 C)   TempSrc:   Oral   Resp:   20 20  Height:      Weight: 261 lb 6.4 oz (118.57 kg)     SpO2:   91%     GEN- The patient is obese in NAD, alert and oriented x 3 today.   HEENT: normocephalic, atraumatic; sclera clear, conjunctiva pink; hearing intact; oropharynx clear; neck supple, no JVP Lymph- no cervical lymphadenopathy Lungs- decreased at the bases, soft crackles b/l bases, normal work of breathing.  No wheezes, rales, rhonchi Heart- irreg rate and rhythm, no murmurs, rubs or gallops, PMI not laterally displaced GI- soft,  non-tender, non-distended Extremities- no clubbing, cyanosis, trace if any edema MS- no significant deformity or atrophy Skin- warm and dry, no rash or lesion Psych- euthymic mood, full affect Neuro- no gross deficits observed  Labs:   Lab Results  Component Value Date   WBC 6.9 08/06/2015   HGB 11.7* 08/06/2015   HCT 36.1 08/06/2015   MCV 96.5 08/06/2015   PLT 205 08/06/2015    Recent Labs Lab 08/05/15 1600 08/06/15 0357  NA 140 140  K 3.5 3.3*  CL 105 102  CO2 26 29  BUN 18 14  CREATININE 1.02* 1.19*  CALCIUM 9.1 8.7*  PROT 6.8  --   BILITOT 1.0  --   ALKPHOS 103  --   ALT 17  --   AST 26  --   GLUCOSE 107* 107*      Radiology/Studies:  Dg Chest 2 View 08/05/2015  CLINICAL DATA:  Shortness of breath, cough EXAM: CHEST  2 VIEW COMPARISON:  None. FINDINGS: Diffuse interstitial prominence throughout the lungs. Heart is mildly enlarged. No effusions. No acute bony abnormality. IMPRESSION: Diffuse interstitial prominence throughout the lungs which could reflect chronic interstitial disease. Chronicity is difficult to determine without old films. Edema is possible. Electronically Signed   By: Rolm Baptise M.D.   On: 08/05/2015 16:18    EKG: Atrial Fib, 143bpm TELEMETRY: Afib, 110-140 08/06/15: Echocardiogram: Study Conclusions - Left ventricle: The cavity size was normal. Wall thickness was normal. Systolic  function was normal. The estimated ejection fraction was in the range of 55% to 60%. Wall motion was normal; there were no regional wall motion abnormalities. - Mitral valve: Calcified annulus. There was mild regurgitation. Valve area by continuity equation (using LVOT flow): 1.63 cm^2. - Left atrium: The atrium was mildly dilated. (43mm)  11/29/13: Myoview Records reports a Low risk result    Assessment and Plan:  1. PAFib, RVR     On diltiazem gtt, rate not completely controlled     CHADS2vasc  Is at least 5 on Eliquis, H/H appears stable, reports not  missing any doses  2. Fluid OL, CHF     S/p lasix fluid neg 1582ml, feeling better     c/w general cardiology  3. HTN     Controlled currently on dilt gtt with relative hypotension      Pending further with Dr. Rayann Heman.  Signed, Tommye Standard, PA-C 08/06/2015 10:15 AM   I have seen, examined the patient, and reviewed the above assessment and plan.  On exam, tachycardic irregular rhythm. Changes to above are made where necessary.    Very complicated situation.  A high level of decision making was required for this encounter. She has refractory afib. Age, sleep apnea, and obesity (Body mass index is 47.02 kg/(m^2). are making our efforts difficult. She is in afib with very fast ventricular rates. Therapeutic strategies for afib were discussed in detail with the patient today.  We discussed cardioversion alone, sotalol, tikosyn, and amiodarone.  Pros and cons to these were discussed at length. She is clear that she would prefer tikosyn at this time.  She is aware of risks of pro arrhythmia with this medicine and wishes to proceed with tikosyn load.  Will therefore correct K and Mg and initiate tikosyn.  Given CrCl of 71, will start tikosyn 500 mcg BID.  I have reviewed her ekgs during sinus and feel that her qt is stable for initiation of tikosyn.  Co Sign: [name], MD 08/06/2015 4:58 PM

## 2015-08-07 LAB — CBC
HEMATOCRIT: 36 % (ref 36.0–46.0)
HEMOGLOBIN: 11.6 g/dL — AB (ref 12.0–15.0)
MCH: 31.4 pg (ref 26.0–34.0)
MCHC: 32.2 g/dL (ref 30.0–36.0)
MCV: 97.6 fL (ref 78.0–100.0)
Platelets: 221 10*3/uL (ref 150–400)
RBC: 3.69 MIL/uL — ABNORMAL LOW (ref 3.87–5.11)
RDW: 13 % (ref 11.5–15.5)
WBC: 6.7 10*3/uL (ref 4.0–10.5)

## 2015-08-07 LAB — BASIC METABOLIC PANEL
ANION GAP: 7 (ref 5–15)
BUN: 18 mg/dL (ref 6–20)
CO2: 27 mmol/L (ref 22–32)
Calcium: 8.6 mg/dL — ABNORMAL LOW (ref 8.9–10.3)
Chloride: 105 mmol/L (ref 101–111)
Creatinine, Ser: 1.07 mg/dL — ABNORMAL HIGH (ref 0.44–1.00)
GFR calc Af Amer: 56 mL/min — ABNORMAL LOW (ref >60)
GFR, EST NON AFRICAN AMERICAN: 48 mL/min — AB (ref >60)
GLUCOSE: 115 mg/dL — AB (ref 65–99)
POTASSIUM: 4.4 mmol/L (ref 3.5–5.1)
Sodium: 139 mmol/L (ref 135–145)

## 2015-08-07 LAB — MAGNESIUM: Magnesium: 2 mg/dL (ref 1.7–2.4)

## 2015-08-07 MED ORDER — DILTIAZEM HCL 30 MG PO TABS
30.0000 mg | ORAL_TABLET | Freq: Four times a day (QID) | ORAL | Status: DC
  Administered 2015-08-07 – 2015-08-11 (×15): 30 mg via ORAL
  Filled 2015-08-07 (×15): qty 1

## 2015-08-07 NOTE — Care Management Note (Addendum)
Case Management Note  Patient Details  Name: Tracy Miles MRN: [id number] Date of Birth: March 13, 1934  Subjective/Objective:   Pt admitted for A Fib RVR- Tikosyn Load. Pt is from [facility].                  Action/Plan: Benefits check in process for Tikosyn- Will make pt aware of cost once completed. CM did call [place] is available. Pt will need  Rx for 7 day supply at d/c to be filled via main Pharmacy and CM will assist with this Rx. Pt will then need an additional Rx for 30 day supply. No further needs from CM at this time.    Expected Discharge Date:                  Expected Discharge Plan:  Home/Self Care  In-House Referral:  NA  Discharge planning Services  CM Consult, Medication Assistance  Post Acute Care Choice:  NA Choice offered to:  NA  DME Arranged:  N/A DME Agency:  NA  HH Arranged:  NA HH Agency:  NA  Status of Service:  Completed, signed off  Medicare Important Message Given:    Date Medicare IM Given:    Medicare IM give by:    Date Additional Medicare IM Given:    Additional Medicare Important Message give by:     If discussed at [place] of Stay Meetings, dates discussed:    Additional Comments:  1536 08-09-15 Jacqlyn Krauss, RN,BSN [phone] CM did speak with pt in regards to Tikosyn and co pay. Pt would like Rx's e scribed to  [place] because they deliver to [facility]. Marland Kitchen  Tier 4 medication-Pt will pay 45% for brand, generic will be 58% pt is in coverage gap- prior auth not required  Bethena Roys, RN 08/07/2015, 9:54 AM

## 2015-08-07 NOTE — Progress Notes (Signed)
Pt placed self on home cpap, pt tolerating well

## 2015-08-07 NOTE — Progress Notes (Signed)
       Patient Name: Tracy Miles Date of Encounter: 08/07/2015    SUBJECTIVE: Patient feels well. Electrophysiology is now on board. Tikosyn to be started.  TELEMETRY:  Atrial fib with rapid ventricular response. Filed Vitals:   08/06/15 2320 08/07/15 0141 08/07/15 0441 08/07/15 0854  BP: 107/84  100/68   Pulse: 125  120   Temp: 98.3 F (36.8 C)  98 F (36.7 C) 98.1 F (36.7 C)  TempSrc: Oral  Oral Oral  Resp: 17 19 18    Height:      Weight:   263 lb 1.6 oz (119.341 kg)   SpO2: 96%  97%     Intake/Output Summary (Last 24 hours) at 08/07/15 0927 Last data filed at 08/07/15 0854  Gross per 24 hour  Intake 1374.3 ml  Output    975 ml  Net  399.3 ml   LABS: Basic Metabolic Panel:  Recent Labs  08/06/15 0357 08/06/15 1945 08/07/15 0355  NA 140 138 139  K 3.3* 3.7 4.4  CL 102 103 105  CO2 29 26 27   GLUCOSE 107* 135* 115*  BUN 14 20 18   CREATININE 1.19* 1.16* 1.07*  CALCIUM 8.7* 8.8* 8.6*  MG 1.9  --  2.0   CBC:  Recent Labs  08/05/15 1600 08/06/15 0357 08/07/15 0355  WBC 7.2 6.9 6.7  NEUTROABS 5.0  --   --   HGB 11.9* 11.7* 11.6*  HCT 35.8* 36.1 36.0  MCV 96.8 96.5 97.6  PLT 202 205 221   Cardiac Enzymes:  Recent Labs  08/05/15 1600  TROPONINI <0.03     Radiology/Studies:  No new data  Physical Exam: Blood pressure 100/68, pulse 120, temperature 98.1 F (36.7 C), temperature source Oral, resp. rate 18, height 5' 2.5" (1.588 m), weight 263 lb 1.6 oz (119.341 kg), SpO2 97 %. Weight change: -6 lb 14.4 oz (-3.13 kg)  Wt Readings from Last 3 Encounters:  08/07/15 263 lb 1.6 oz (119.341 kg)  08/05/15 269 lb 6.4 oz (122.199 kg)  06/20/15 263 lb (119.296 kg)    Obese Rapid irregular rhythm  ASSESSMENT:  1. Persistent atrial fibrillation with rapid ventricular response and relatively soft blood pressures on medication regimen to control rate. EP is recommended addition of Tikosyn and if no spontaneous conversion, electrical conversion  after several days.  Plan:  Follow EP recommendations  Signed, Sinclair Grooms 08/07/2015, 9:27 AM

## 2015-08-07 NOTE — Progress Notes (Signed)
SUBJECTIVE: The patient is doing well today.  At this time, she denies chest pain, shortness of breath, or any new concerns.  Marland Kitchen apixaban  5 mg Oral BID  . atorvastatin  20 mg Oral Daily  . calcium carbonate  1 tablet Oral BID WC  . dofetilide  500 mcg Oral BID  . furosemide  40 mg Intravenous Once  . lisinopril  10 mg Oral Daily  . psyllium  1 packet Oral BID  . sodium chloride  3 mL Intravenous Q12H      OBJECTIVE: Physical Exam: Filed Vitals:   08/06/15 2250 08/06/15 2320 08/07/15 0141 08/07/15 0441  BP: 125/95 107/84  100/68  Pulse:  125  120  Temp:  98.3 F (36.8 C)  98 F (36.7 C)  TempSrc:  Oral  Oral  Resp: 27 17 19 18   Height:      Weight:    263 lb 1.6 oz (119.341 kg)  SpO2:  96%  97%    Intake/Output Summary (Last 24 hours) at 08/07/15 0809 Last data filed at 08/07/15 0451  Gross per 24 hour  Intake 1014.3 ml  Output    850 ml  Net  164.3 ml    Telemetry reveals AFib, 120-140's  GEN- The patient is obese, in NAD, alert and oriented x 3 today.   Head- normocephalic, atraumatic Eyes-  Sclera clear, conjunctiva pink Ears- hearing intact Neck- supple Lungs- Clear to ausculation bilaterally, normal work of breathing Heart- irregular, tachycardic, no significant murmurs, no rubs or gallops GI- soft, NT, ND, + BS Extremities- no clubbing, cyanosis, or edema Skin- no rash or lesion Psych- euthymic mood, full affect Neuro- no gross deficits appreciated  LABS: Basic Metabolic Panel:  Recent Labs  08/06/15 0357 08/06/15 1945 08/07/15 0355  NA 140 138 139  K 3.3* 3.7 4.4  CL 102 103 105  CO2 29 26 27   GLUCOSE 107* 135* 115*  BUN 14 20 18   CREATININE 1.19* 1.16* 1.07*  CALCIUM 8.7* 8.8* 8.6*  MG 1.9  --  2.0   Liver Function Tests:  Recent Labs  08/05/15 1600  AST 26  ALT 17  ALKPHOS 103  BILITOT 1.0  PROT 6.8  ALBUMIN 3.9   CBC:  Recent Labs  08/05/15 1600 08/06/15 0357 08/07/15 0355  WBC 7.2 6.9 6.7  NEUTROABS 5.0  --    --   HGB 11.9* 11.7* 11.6*  HCT 35.8* 36.1 36.0  MCV 96.8 96.5 97.6  PLT 202 205 221   Cardiac Enzymes:  Recent Labs  08/05/15 1600  TROPONINI <0.03   Thyroid Function Tests:  Recent Labs  08/06/15 0357  TSH 1.898    ASSESSMENT AND PLAN:  Active Problems:   Atrial fibrillation (HCC)   Atrial fibrillation with RVR (HCC)   Morbid obesity due to excess calories (HCC)   Sleep apnea  1. PAFib, RVR     Plan to start Tikosyn today (off Verapamil since admit 08/05/15, last dose at home Sunday evening)     K+ 4.4, Mag 2.0, QTc OK to start (reviewed with Dr. Rayann Heman, last SR EKG QTC in Jan 2016 was 464ms)     Continue to follow EKG and electrolytes     reports compliant with her Eliquis, on chronically without missed doses  2. HTN     Relative hypotension on cardizemm gtt, off with better BP  3. CHF     improved     -1418ml   4. OSA  Compliant with her CPAP  Tommye Standard, PA-C 08/07/2015 8:09 AM   I have seen, examined the patient, and reviewed the above assessment and plan. On exam, iRRR. Changes to above are made where necessary.    Co Sign: [name], MD 08/07/2015 10:18 PM

## 2015-08-07 NOTE — Progress Notes (Addendum)
Patient HR continues to fluctuate from 120-130s. Patient BP stable and denies any shortness of breath, chest pain or distress. Notified Dr. Philbert Riser and will continue to monitor patient closely.   2350- Per Dr. Philbert Riser continue to monitor patient for any symptoms. To start Tikosyn tomorrow and possible [place].

## 2015-08-07 NOTE — Progress Notes (Signed)
Patient brought CPAP from home with tubing and nasal pillows. Patient stated she can place herself on.

## 2015-08-08 DIAGNOSIS — I481 Persistent atrial fibrillation: Secondary | ICD-10-CM

## 2015-08-08 LAB — BASIC METABOLIC PANEL
Anion gap: 9 (ref 5–15)
BUN: 14 mg/dL (ref 6–20)
CHLORIDE: 103 mmol/L (ref 101–111)
CO2: 24 mmol/L (ref 22–32)
Calcium: 8.9 mg/dL (ref 8.9–10.3)
Creatinine, Ser: 1.04 mg/dL — ABNORMAL HIGH (ref 0.44–1.00)
GFR calc non Af Amer: 50 mL/min — ABNORMAL LOW (ref >60)
GFR, EST AFRICAN AMERICAN: 58 mL/min — AB (ref >60)
Glucose, Bld: 103 mg/dL — ABNORMAL HIGH (ref 65–99)
POTASSIUM: 4.1 mmol/L (ref 3.5–5.1)
SODIUM: 136 mmol/L (ref 135–145)

## 2015-08-08 LAB — MAGNESIUM: Magnesium: 2.1 mg/dL (ref 1.7–2.4)

## 2015-08-08 LAB — CBC
HEMATOCRIT: 35.2 % — AB (ref 36.0–46.0)
HEMOGLOBIN: 11.5 g/dL — AB (ref 12.0–15.0)
MCH: 31.6 pg (ref 26.0–34.0)
MCHC: 32.7 g/dL (ref 30.0–36.0)
MCV: 96.7 fL (ref 78.0–100.0)
Platelets: 237 10*3/uL (ref 150–400)
RBC: 3.64 MIL/uL — AB (ref 3.87–5.11)
RDW: 12.6 % (ref 11.5–15.5)
WBC: 5.5 10*3/uL (ref 4.0–10.5)

## 2015-08-08 MED ORDER — SODIUM CHLORIDE 0.9 % IJ SOLN
3.0000 mL | Freq: Two times a day (BID) | INTRAMUSCULAR | Status: DC
  Administered 2015-08-08 – 2015-08-11 (×6): 3 mL via INTRAVENOUS

## 2015-08-08 MED ORDER — SODIUM CHLORIDE 0.9 % IV SOLN: 250.0000 mL | INTRAVENOUS | Status: DC

## 2015-08-08 MED ORDER — SODIUM CHLORIDE 0.9 % IJ SOLN: 3.0000 mL | INTRAMUSCULAR | Status: DC | PRN

## 2015-08-08 NOTE — Care Management Important Message (Signed)
Important Message  Patient Details  Name: Tracy Miles MRN: [id number] Date of Birth: 1933-09-05   Medicare Important Message Given:  Yes    [name] 08/08/2015, 2:00 PM

## 2015-08-08 NOTE — Progress Notes (Signed)
SUBJECTIVE: The patient is doing well today.  At this time, she denies chest pain, shortness of breath, or any new concerns.   Marland Kitchen apixaban  5 mg Oral BID  . atorvastatin  20 mg Oral Daily  . calcium carbonate  1 tablet Oral BID WC  . diltiazem  30 mg Oral 4 times per day  . dofetilide  500 mcg Oral BID  . furosemide  40 mg Intravenous Once  . lisinopril  10 mg Oral Daily  . psyllium  1 packet Oral BID  . sodium chloride  3 mL Intravenous Q12H      OBJECTIVE: Physical Exam: Filed Vitals:   08/07/15 2358 08/08/15 0140 08/08/15 0525 08/08/15 0530  BP: 140/77 139/94 117/78   Pulse: 134  130   Temp: 98.9 F (37.2 C)  98.9 F (37.2 C)   TempSrc: Oral  Oral   Resp: 21 16 20    Height:      Weight:    264 lb 9.6 oz (120.022 kg)  SpO2: 92%  96%     Intake/Output Summary (Last 24 hours) at 08/08/15 0653 Last data filed at 08/08/15 0533  Gross per 24 hour  Intake   1140 ml  Output   1325 ml  Net   -185 ml    Telemetry reveals AFib, 1100-120  GEN- The patient is obese, in NAD, alert and oriented x 3 today.   Head- normocephalic, atraumatic Eyes-  Sclera clear, conjunctiva pink Ears- hearing intact Neck- supple Lungs- Clear to ausculation bilaterally, normal work of breathing Heart- irregular, tachycardic, no significant murmurs, no rubs or gallops GI- soft, NT, ND, + BS Extremities- no clubbing, cyanosis, or edema Skin- no rash or lesion Psych- euthymic mood, full affect Neuro- no gross deficits appreciated  LABS: Basic Metabolic Panel:  Recent Labs  08/06/15 0357 08/06/15 1945 08/07/15 0355  NA 140 138 139  K 3.3* 3.7 4.4  CL 102 103 105  CO2 29 26 27   GLUCOSE 107* 135* 115*  BUN 14 20 18   CREATININE 1.19* 1.16* 1.07*  CALCIUM 8.7* 8.8* 8.6*  MG 1.9  --  2.0   Liver Function Tests:  Recent Labs  08/05/15 1600  AST 26  ALT 17  ALKPHOS 103  BILITOT 1.0  PROT 6.8  ALBUMIN 3.9   CBC:  Recent Labs  08/05/15 1600 08/06/15 0357 08/07/15 0355   WBC 7.2 6.9 6.7  NEUTROABS 5.0  --   --   HGB 11.9* 11.7* 11.6*  HCT 35.8* 36.1 36.0  MCV 96.8 96.5 97.6  PLT 202 205 221   Cardiac Enzymes:  Recent Labs  08/05/15 1600  TROPONINI <0.03   Thyroid Function Tests:  Recent Labs  08/06/15 0357  TSH 1.898    ASSESSMENT AND PLAN:  Active Problems:   Atrial fibrillation (HCC)   Atrial fibrillation with RVR (HCC)   Morbid obesity due to excess calories (HCC)   Sleep apnea  1. PAFib, RVR    Tikosyn loading has gotten 2 doses      K+ 4.1, Mag 2.1, EKG reviewed with Dr. Lovena Le, ok to continue load  (last SR EKG QTC in Jan 2016 was 496ms)     Continue to follow EKG and electrolytes     reports compliant with her Eliquis, on chronically without missed doses     If not converted to SR today will plan for CV tomorrow  2. HTN     controlled  3. CHF  improved     - 2178ml   4. OSA     Compliant with her CPAP  Tommye Standard, PA-C 08/08/2015 6:53 AM    I have seen, examined the patient, and reviewed the above assessment and plan.  On exam, iRRR Changes to above are made where necessary.  Anticipate cardioversion tomorrow.  RIsks and benefits discussed with patient who wishes to proceed.  Co Sign: [name], MD 08/08/2015 9:30 PM

## 2015-08-09 ENCOUNTER — Encounter (HOSPITAL_COMMUNITY): Payer: Self-pay | Admitting: *Deleted

## 2015-08-09 ENCOUNTER — Inpatient Hospital Stay (HOSPITAL_COMMUNITY): Payer: Medicare Other | Admitting: Certified Registered Nurse Anesthetist

## 2015-08-09 ENCOUNTER — Encounter (HOSPITAL_COMMUNITY)
Admission: EM | Disposition: A | Discharge status: Home / Self Care | Payer: Self-pay | Source: Home / Self Care | Attending: Internal Medicine

## 2015-08-09 DIAGNOSIS — I480 Paroxysmal atrial fibrillation: Principal | ICD-10-CM

## 2015-08-09 HISTORY — PX: CARDIOVERSION: SHX1299

## 2015-08-09 LAB — BASIC METABOLIC PANEL
ANION GAP: 8 (ref 5–15)
BUN: 12 mg/dL (ref 6–20)
CALCIUM: 8.6 mg/dL — AB (ref 8.9–10.3)
CO2: 25 mmol/L (ref 22–32)
Chloride: 103 mmol/L (ref 101–111)
Creatinine, Ser: 0.95 mg/dL (ref 0.44–1.00)
GFR, EST NON AFRICAN AMERICAN: 55 mL/min — AB (ref >60)
Glucose, Bld: 102 mg/dL — ABNORMAL HIGH (ref 65–99)
POTASSIUM: 3.9 mmol/L (ref 3.5–5.1)
SODIUM: 136 mmol/L (ref 135–145)

## 2015-08-09 LAB — MAGNESIUM: MAGNESIUM: 2.1 mg/dL (ref 1.7–2.4)

## 2015-08-09 SURGERY — CARDIOVERSION
Anesthesia: General

## 2015-08-09 MED ORDER — PROPOFOL 10 MG/ML IV BOLUS
INTRAVENOUS | Status: DC | PRN
  Administered 2015-08-09: 50 mg via INTRAVENOUS

## 2015-08-09 MED ORDER — LIDOCAINE HCL (CARDIAC) 20 MG/ML IV SOLN
INTRAVENOUS | Status: DC | PRN
  Administered 2015-08-09: 50 mg via INTRAVENOUS

## 2015-08-09 MED ORDER — POTASSIUM CHLORIDE CRYS ER 20 MEQ PO TBCR
40.0000 meq | EXTENDED_RELEASE_TABLET | Freq: Once | ORAL | Status: AC
  Administered 2015-08-09: 40 meq via ORAL
  Filled 2015-08-09: qty 2

## 2015-08-09 NOTE — Progress Notes (Signed)
QTc about 500 msec after cardioversion i will touch base with JA but would be inclined to reduce dose to 375  He would like to keep at the current dose but if she is longer tomorrow will need to reduce the dose and she will stay until sunday

## 2015-08-09 NOTE — Progress Notes (Signed)
Patient to self administer CPAP and did not wish for any respiratory assistance.  RT advised pt to notify if she had any concerns.

## 2015-08-09 NOTE — Interval H&P Note (Signed)
History and Physical Interval Note:  08/09/2015 11:45 AM  Tracy Miles  has presented today for surgery, with the diagnosis of afib  The various methods of treatment have been discussed with the patient and family. After consideration of risks, benefits and other options for treatment, the patient has consented to  Procedure(s): CARDIOVERSION (N/A) as a surgical intervention .  The patient's history has been reviewed, patient examined, no change in status, stable for surgery.  I have reviewed the patient's chart and labs.  Questions were answered to the patient's satisfaction.     SKAINS, MARK

## 2015-08-09 NOTE — Transfer of Care (Signed)
Immediate Anesthesia Transfer of Care Note  Patient: Tracy Miles  Procedure(s) Performed: Procedure(s): CARDIOVERSION (N/A)  Patient Location: PACU  Anesthesia Type:General  Level of Consciousness: awake, patient cooperative and responds to stimulation  Airway & Oxygen Therapy: Patient Spontanous Breathing and Patient connected to nasal cannula oxygen  Post-op Assessment: Report given to RN and Post -op Vital signs reviewed and stable  Post vital signs: Reviewed and stable  Last Vitals:  Filed Vitals:   08/09/15 0837 08/09/15 1133  BP: 111/73 202/112  Pulse: 122   Temp: 36.9 C 36.7 C  Resp: 18 22    Complications: No apparent anesthesia complications

## 2015-08-09 NOTE — Anesthesia Preprocedure Evaluation (Signed)
Anesthesia Evaluation  Patient identified by MRN, date of birth, ID band Patient awake    Reviewed: Allergy & Precautions, H&P , NPO status , Patient's Chart, lab work & pertinent test results  Airway Mallampati: II  TM Distance: >3 FB Neck ROM: Full    Dental no notable dental hx. (+) Teeth Intact, Dental Advisory Given   Pulmonary sleep apnea and Continuous Positive Airway Pressure Ventilation ,    Pulmonary exam normal breath sounds clear to auscultation       Cardiovascular hypertension, Pt. on medications + CAD  + dysrhythmias Atrial Fibrillation  Rhythm:Irregular Rate:Normal     Neuro/Psych negative neurological ROS  negative psych ROS   GI/Hepatic negative GI ROS, Neg liver ROS,   Endo/Other  Morbid obesity  Renal/GU negative Renal ROS  negative genitourinary   Musculoskeletal  (+) Arthritis , Osteoarthritis,    Abdominal   Peds  Hematology negative hematology ROS (+)   Anesthesia Other Findings   Reproductive/Obstetrics negative OB ROS                             Anesthesia Physical Anesthesia Plan  ASA: III  Anesthesia Plan: General   Post-op Pain Management:    Induction: Intravenous  Airway Management Planned: Mask  Additional Equipment:   Intra-op Plan:   Post-operative Plan:   Informed Consent: I have reviewed the patients History and Physical, chart, labs and discussed the procedure including the risks, benefits and alternatives for the proposed anesthesia with the patient or authorized representative who has indicated his/her understanding and acceptance.   Dental advisory given  Plan Discussed with: CRNA  Anesthesia Plan Comments:         Anesthesia Quick Evaluation

## 2015-08-09 NOTE — H&P (View-Only) (Signed)
SUBJECTIVE: The patient is doing well today.  At this time  she continues to feel well, denies chest pain, shortness of breath, or any new concerns.   Marland Kitchen apixaban  5 mg Oral BID  . atorvastatin  20 mg Oral Daily  . calcium carbonate  1 tablet Oral BID WC  . diltiazem  30 mg Oral 4 times per day  . dofetilide  500 mcg Oral BID  . furosemide  40 mg Intravenous Once  . lisinopril  10 mg Oral Daily  . psyllium  1 packet Oral BID  . sodium chloride  3 mL Intravenous Q12H  . sodium chloride  3 mL Intravenous Q12H   . sodium chloride      OBJECTIVE: Physical Exam: Filed Vitals:   08/08/15 1655 08/08/15 1956 08/08/15 2020 08/09/15 0009  BP: 158/95 128/82 136/66 118/83  Pulse: 108 128  123  Temp: 98 F (36.7 C) 99 F (37.2 C) 98.2 F (36.8 C) 98.4 F (36.9 C)  TempSrc: Oral Oral Oral Oral  Resp:  19 20 21   Height:      Weight:      SpO2: 99% 96% 96% 98%    Intake/Output Summary (Last 24 hours) at 08/09/15 0459 Last data filed at 08/08/15 2354  Gross per 24 hour  Intake    483 ml  Output   2250 ml  Net  -1767 ml    Telemetry reveals AFib, 110-120   GEN- The patient is obese, in NAD, alert and oriented x 3 today.   Head- normocephalic, atraumatic Eyes-  Sclera clear, conjunctiva pink Ears- hearing intact Neck- supple Lungs- Clear to ausculation bilaterally, normal work of breathing Heart- irregular, tachycardic, no significant murmurs, no rubs or gallops GI- soft, NT, ND, + BS Extremities- no clubbing, cyanosis, or edema Skin- no rash or lesion Psych- euthymic mood, full affect Neuro- no gross deficits appreciated  LABS: Basic Metabolic Panel:  Recent Labs  08/07/15 0355 08/08/15 0600  NA 139 136  K 4.4 4.1  CL 105 103  CO2 27 24  GLUCOSE 115* 103*  BUN 18 14  CREATININE 1.07* 1.04*  CALCIUM 8.6* 8.9  MG 2.0 2.1   CBC:  Recent Labs  08/07/15 0355 08/08/15 0600  WBC 6.7 5.5  HGB 11.6* 11.5*  HCT 36.0 35.2*  MCV 97.6 96.7  PLT 221 237     ASSESSMENT AND PLAN:  Active Problems:   Atrial fibrillation (HCC)   Atrial fibrillation with RVR (HCC)   Morbid obesity due to excess calories (HCC)   Sleep apnea  1. PAFib, RVR    Tikosyn loading has gotten 5doses      K+  3.9, Mag  2.1, EKG/QTc appears stable  (last SR EKG QTC in Jan 2016 was 414ms)     Continue to follow EKG and electrolytes     reports compliant with her Eliquis, on chronically without missed doses     Plan for CV today  2. HTN     controlled  3. CHF     Exam is euvolemic  4. OSA     Compliant with her CPAP  Tommye Standard, PA-C 08/09/2015 4:59 AM   I have seen, examined the patient, and reviewed the above assessment and plan.  On exam, iRRR. Changes to above are made where necessary.   Risks of cardioversion discussed with patient who wishes to proceed. Will anticipate discharge to home tomorrow AM if remains in sinus post cardioversion.  Will need follow-up  in [place] clinic in 1 week and AF clinic in 4 weeks.  IF she fails to maintain sinus with tikosyn, would recommend amiodarone with outpatient oral load as our next step.  Co Sign: [name], MD 08/09/2015 9:45 AM

## 2015-08-09 NOTE — Progress Notes (Signed)
SUBJECTIVE: The patient is doing well today.  At this time  she continues to feel well, denies chest pain, shortness of breath, or any new concerns.   Marland Kitchen apixaban  5 mg Oral BID  . atorvastatin  20 mg Oral Daily  . calcium carbonate  1 tablet Oral BID WC  . diltiazem  30 mg Oral 4 times per day  . dofetilide  500 mcg Oral BID  . furosemide  40 mg Intravenous Once  . lisinopril  10 mg Oral Daily  . psyllium  1 packet Oral BID  . sodium chloride  3 mL Intravenous Q12H  . sodium chloride  3 mL Intravenous Q12H   . sodium chloride      OBJECTIVE: Physical Exam: Filed Vitals:   08/08/15 1655 08/08/15 1956 08/08/15 2020 08/09/15 0009  BP: 158/95 128/82 136/66 118/83  Pulse: 108 128  123  Temp: 98 F (36.7 C) 99 F (37.2 C) 98.2 F (36.8 C) 98.4 F (36.9 C)  TempSrc: Oral Oral Oral Oral  Resp:  19 20 21   Height:      Weight:      SpO2: 99% 96% 96% 98%    Intake/Output Summary (Last 24 hours) at 08/09/15 0459 Last data filed at 08/08/15 2354  Gross per 24 hour  Intake    483 ml  Output   2250 ml  Net  -1767 ml    Telemetry reveals AFib, 110-120   GEN- The patient is obese, in NAD, alert and oriented x 3 today.   Head- normocephalic, atraumatic Eyes-  Sclera clear, conjunctiva pink Ears- hearing intact Neck- supple Lungs- Clear to ausculation bilaterally, normal work of breathing Heart- irregular, tachycardic, no significant murmurs, no rubs or gallops GI- soft, NT, ND, + BS Extremities- no clubbing, cyanosis, or edema Skin- no rash or lesion Psych- euthymic mood, full affect Neuro- no gross deficits appreciated  LABS: Basic Metabolic Panel:  Recent Labs  08/07/15 0355 08/08/15 0600  NA 139 136  K 4.4 4.1  CL 105 103  CO2 27 24  GLUCOSE 115* 103*  BUN 18 14  CREATININE 1.07* 1.04*  CALCIUM 8.6* 8.9  MG 2.0 2.1   CBC:  Recent Labs  08/07/15 0355 08/08/15 0600  WBC 6.7 5.5  HGB 11.6* 11.5*  HCT 36.0 35.2*  MCV 97.6 96.7  PLT 221 237     ASSESSMENT AND PLAN:  Active Problems:   Atrial fibrillation (HCC)   Atrial fibrillation with RVR (HCC)   Morbid obesity due to excess calories (HCC)   Sleep apnea  1. PAFib, RVR    Tikosyn loading has gotten 5doses      K+  3.9, Mag  2.1, EKG/QTc appears stable  (last SR EKG QTC in Jan 2016 was 414ms)     Continue to follow EKG and electrolytes     reports compliant with her Eliquis, on chronically without missed doses     Plan for CV today  2. HTN     controlled  3. CHF     Exam is euvolemic  4. OSA     Compliant with her CPAP  Tommye Standard, PA-C 08/09/2015 4:59 AM   I have seen, examined the patient, and reviewed the above assessment and plan.  On exam, iRRR. Changes to above are made where necessary.   Risks of cardioversion discussed with patient who wishes to proceed. Will anticipate discharge to home tomorrow AM if remains in sinus post cardioversion.  Will need follow-up  in [place] clinic in 1 week and AF clinic in 4 weeks.  IF she fails to maintain sinus with tikosyn, would recommend amiodarone with outpatient oral load as our next step.  Co Sign: [name], MD 08/09/2015 9:45 AM

## 2015-08-09 NOTE — CV Procedure (Signed)
    Electrical Cardioversion Procedure Note Tracy Miles [id number] 01-May-1934  Procedure: Electrical Cardioversion Indications:  Atrial Fibrillation  Time Out: Verified patient identification, verified procedure,medications/allergies/relevent history reviewed, required imaging and test results available.  Performed  Procedure Details  The patient was NPO after midnight. Anesthesia was administered at the beside  by Dr. Ola Spurr with propofol.  Cardioversion was performed with synchronized biphasic defibrillation via AP pads with 120 joules.  1 attempt(s) were performed.  The patient converted to normal sinus rhythm. The patient tolerated the procedure well   IMPRESSION:  Successful cardioversion of atrial fibrillation    SKAINS, MARK 08/09/2015, 12:02 PM

## 2015-08-09 NOTE — Anesthesia Postprocedure Evaluation (Signed)
Anesthesia Post Note  Patient: Tracy Miles  Procedure(s) Performed: Procedure(s) (LRB): CARDIOVERSION (N/A)  Patient location during evaluation: PACU Anesthesia Type: General Level of consciousness: awake and alert Pain management: pain level controlled Vital Signs Assessment: post-procedure vital signs reviewed and stable Respiratory status: spontaneous breathing, nonlabored ventilation and respiratory function stable Cardiovascular status: blood pressure returned to baseline and stable Postop Assessment: no signs of nausea or vomiting Anesthetic complications: no    Last Vitals:  Filed Vitals:   08/09/15 1205 08/09/15 1210  BP: 126/61 126/61  Pulse: 94 79  Temp: 36.7 C   Resp: 20 18    Last Pain:  Filed Vitals:   08/09/15 1213  PainSc: 0-No pain                 Erva Koke,W. EDMOND

## 2015-08-10 MED ORDER — DOFETILIDE 250 MCG PO CAPS
250.0000 ug | ORAL_CAPSULE | Freq: Two times a day (BID) | ORAL | Status: DC
  Administered 2015-08-10 – 2015-08-11 (×2): 250 ug via ORAL
  Filled 2015-08-10 (×2): qty 1

## 2015-08-10 NOTE — Progress Notes (Signed)
Patient ID: [name], female   DOB: [birth year], 81 y.o.   MRN: [id number]    Patient Name: Tracy Miles Date of Encounter: 08/10/2015     Active Problems:   Atrial fibrillation (HCC)   Atrial fibrillation with RVR ([place])   Morbid obesity due to excess calories (HCC)   Sleep apnea    SUBJECTIVE  No chest pain or sob or palpitations.   CURRENT MEDS . apixaban  5 mg Oral BID  . atorvastatin  20 mg Oral Daily  . calcium carbonate  1 tablet Oral BID WC  . diltiazem  30 mg Oral 4 times per day  . dofetilide  250 mcg Oral BID  . furosemide  40 mg Intravenous Once  . lisinopril  10 mg Oral Daily  . psyllium  1 packet Oral BID  . sodium chloride  3 mL Intravenous Q12H  . sodium chloride  3 mL Intravenous Q12H    OBJECTIVE  Filed Vitals:   08/09/15 1314 08/09/15 1728 08/09/15 1933 08/10/15 0356  BP: 127/72 122/72 109/70 144/70  Pulse: 79  85 78  Temp: 98 F (36.7 C)  98.2 F (36.8 C) 98.3 F (36.8 C)  TempSrc: Oral  Oral Oral  Resp: 20  23   Height:      Weight:    264 lb 6.4 oz (119.931 kg)  SpO2: 98%  97% 94%    Intake/Output Summary (Last 24 hours) at 08/10/15 1155 Last data filed at 08/10/15 [id number]  Gross per 24 hour  Intake   1320 ml  Output   2100 ml  Net   -780 ml   Filed Weights   08/08/15 0530 08/09/15 0500 08/10/15 0356  Weight: 264 lb 9.6 oz (120.022 kg) 264 lb 4.8 oz (119.886 kg) 264 lb 6.4 oz (119.931 kg)    PHYSICAL EXAM  General: Pleasant, NAD. Neuro: Alert and oriented X 3. Moves all extremities spontaneously. Psych: Normal affect. HEENT:  Normal  Neck: Supple without bruits or JVD. Lungs:  Resp regular and unlabored, CTA. Heart: RRR no s3, s4, or murmurs. Abdomen: Soft, non-tender, non-distended, BS + x 4.  Extremities: No clubbing, cyanosis or edema. DP/PT/Radials 2+ and equal bilaterally.  Accessory Clinical Findings  CBC  Recent Labs  08/08/15 0600  WBC 5.5  HGB 11.5*  HCT 35.2*  MCV 96.7  PLT [id number]   Basic  Metabolic Panel  Recent Labs  08/08/15 0600 08/09/15 0335 08/09/15 0733  NA 136  --  136  K 4.1  --  3.9  CL 103  --  103  CO2 24  --  25  GLUCOSE 103*  --  102*  BUN 14  --  12  CREATININE 1.04*  --  0.95  CALCIUM 8.9  --  8.6*  MG 2.1 2.1  --    Liver Function Tests No results for input(s): AST, ALT, ALKPHOS, BILITOT, PROT, ALBUMIN in the last 72 hours. No results for input(s): LIPASE, AMYLASE in the last 72 hours. Cardiac Enzymes No results for input(s): CKTOTAL, CKMB, CKMBINDEX, TROPONINI in the last 72 hours. BNP Invalid input(s): POCBNP D-Dimer No results for input(s): DDIMER in the last 72 hours. Hemoglobin A1C No results for input(s): HGBA1C in the last 72 hours. Fasting Lipid Panel No results for input(s): CHOL, HDL, LDLCALC, TRIG, CHOLHDL, LDLDIRECT in the last 72 hours. Thyroid Function Tests No results for input(s): TSH, T4TOTAL, T3FREE, THYROIDAB in the last 72 hours.  Invalid input(s): FREET3  TELE  nsr  ECG  nsr with prolonged QT  Radiology/Studies  Dg Chest 2 View  08/05/2015  CLINICAL DATA:  Shortness of breath, cough EXAM: CHEST  2 VIEW COMPARISON:  None. FINDINGS: Diffuse interstitial prominence throughout the lungs. Heart is mildly enlarged. No effusions. No acute bony abnormality. IMPRESSION: Diffuse interstitial prominence throughout the lungs which could reflect chronic interstitial disease. Chronicity is difficult to determine without old films. Edema is possible. Electronically Signed   By: Rolm Baptise M.D.   On: 08/05/2015 16:18    ASSESSMENT AND PLAN  1. Atrial fib with a CVR 2. Initiation of Tikosyn 3. QTC prolongation Rec: I have reduced her dose of Tikosyn from 500 to 250 bid. Will follow QT. We could go to 375 bid if needed. Will watch in the hospital another 24 hours.   Gregg Taylor,M.D.  08/10/2015 11:55 AM

## 2015-08-10 NOTE — Progress Notes (Signed)
Patient to self administer CPAP and did not desire respiratory assistance.

## 2015-08-11 ENCOUNTER — Encounter (HOSPITAL_COMMUNITY): Payer: Self-pay | Admitting: Cardiology

## 2015-08-11 DIAGNOSIS — I5032 Chronic diastolic (congestive) heart failure: Secondary | ICD-10-CM

## 2015-08-11 DIAGNOSIS — Z5181 Encounter for therapeutic drug level monitoring: Secondary | ICD-10-CM

## 2015-08-11 DIAGNOSIS — Z79899 Other long term (current) drug therapy: Secondary | ICD-10-CM

## 2015-08-11 MED ORDER — DILTIAZEM HCL 30 MG PO TABS: 30.0000 mg | ORAL_TABLET | Freq: Four times a day (QID) | ORAL | Status: DC

## 2015-08-11 MED ORDER — PSYLLIUM 95 % PO PACK: 1.0000 | PACK | Freq: Two times a day (BID) | ORAL | Status: DC

## 2015-08-11 MED ORDER — DOFETILIDE 250 MCG PO CAPS: 250.0000 ug | ORAL_CAPSULE | Freq: Two times a day (BID) | ORAL | Status: DC

## 2015-08-11 MED ORDER — DILTIAZEM HCL 30 MG PO TABS
30.0000 mg | ORAL_TABLET | Freq: Four times a day (QID) | ORAL | Status: DC
Start: 2015-08-11 — End: 2015-10-16

## 2015-08-11 NOTE — Discharge Summary (Signed)
Physician Discharge Summary       Patient ID: Tracy Miles MRN: [id number] DOB/AGE: 1934-07-05 81 y.o.  Admit date: 08/05/2015 Discharge date: 08/11/2015 Primary Cardiologist: Dr. Meda Coffee EP  Dr. Rayann Heman    Discharge Diagnoses:  Principal Problem:   Atrial fibrillation [facility]) Active Problems:   Atrial fibrillation with RVR ([place])   Morbid obesity due to excess calories [facility])   Sleep apnea   Visit for monitoring Tikosyn therapy   Chronic diastolic HF (heart failure) [facility])   Discharged Condition: good  Procedures: DCCV 08/09/15  Hospital Course:   81 year old female admitted for return of PAF, previous episode had been 2 years ago on 08/05/15.  CHADS2-VASc = 5=6 siggesting 7-9% annual risk of stroke and strong indication for [place]che.  She had not been feeling well on admit with recent URI.  She had gained 10 lbs week prior to admit.  She has a hx OSA on CPAP, mild Aortic stenosis.    Pt admitted and evaluated by Dr. Rayann Heman  While pt on dilt drip but HR not well controlled.  She reported not missing any doses of Eliquis and plans were made after discussion to begin Tikosyn.  Started at 500 mcg bid- after she had been off Verapamil since 08/05/15.     Pt underwent DCCV to SR on 08/09/15 with 120j, one attempt was successful.   Post DCCV her Qtc was about 500 msec.  With this her Phyllis Ginger was reduced to 250 BID.   By 08/11/15 her Qtc was improved and she was seen and evaluated by Dr. Lovena Le and found stable for discharge.   Qtc= 470.  She is not to have Verapamil or HCTZ with Tikosyn.  She has appt in a fib clinic 08/16/15 and will need ECG and BMP and Mg+ level drawn- this will be ordered in the office.   On progress note Dr Rayann Heman noted "IF she fails to maintain sinus with tikosyn, would recommend amiodarone with outpatient oral load as our next step."  She was discharged with 1 week supply from [facility] and script went to her pharmacy for 60/11.  She was given IV lasix in  hospital, I resumed her bumex as outpt.  Her dilt po I ordered for 1 week at regular pharmacy and sent 30 day supply into her pharmacy.     Consults: cardiology  Significant Diagnostic Studies:  BMP Latest Ref Rng 08/09/2015 08/08/2015 08/07/2015  Glucose 65 - 99 mg/dL 102(H) 103(H) 115(H)  BUN 6 - 20 mg/dL 12 14 18   Creatinine 0.44 - 1.00 mg/dL 0.95 1.04(H) 1.07(H)  Sodium 135 - 145 mmol/L 136 136 139  Potassium 3.5 - 5.1 mmol/L 3.9 4.1 4.4  Chloride 101 - 111 mmol/L 103 103 105  CO2 22 - 32 mmol/L 25 24 27   Calcium 8.9 - 10.3 mg/dL 8.6(L) 8.9 8.6(L)   CBC Latest Ref Rng 08/08/2015 08/07/2015 08/06/2015  WBC 4.0 - 10.5 K/uL 5.5 6.7 6.9  Hemoglobin 12.0 - 15.0 g/dL 11.5(L) 11.6(L) 11.7(L)  Hematocrit 36.0 - 46.0 % 35.2(L) 36.0 36.1  Platelets 150 - 400 K/uL 237 221 205    BNP 322  Troponin <0.03  TSH 1.898  ECHO: Study Conclusions  - Left ventricle: The cavity size was normal. Wall thickness was normal. Systolic function was normal. The estimated ejection fraction was in the range of 55% to 60%. Wall motion was normal; there were no regional wall motion abnormalities. - Mitral valve: Calcified annulus. There was mild regurgitation. Valve area  by continuity equation (using LVOT flow): 1.63 cm^2. - Left atrium: The atrium was mildly dilated.  CHEST 2 VIEW COMPARISON: None. FINDINGS: Diffuse interstitial prominence throughout the lungs. Heart is mildly enlarged. No effusions. No acute bony abnormality. IMPRESSION: Diffuse interstitial prominence throughout the lungs which could reflect chronic interstitial disease. Chronicity is difficult to determine without old films. Edema is possible.    Discharge Exam: Blood pressure 140/70, pulse 75, temperature 98 F (36.7 C), temperature source Oral, resp. rate 24, height 5' 2.5" (1.588 m), weight 264 lb 9.6 oz (120.022 kg), SpO2 97 %.   Disposition: 01-Home or Self Care     Medication List    STOP taking  these medications        ibuprofen 200 MG tablet  Commonly known as:  ADVIL,MOTRIN     psyllium 0.52 G capsule  Commonly known as:  REGULOID  Replaced by:  psyllium 95 % Pack     verapamil 180 MG CR tablet  Commonly known as:  CALAN-SR      TAKE these medications        acetaminophen 650 MG CR tablet  Commonly known as:  TYLENOL  Take 1,300 mg by mouth every evening.     albuterol 108 (90 BASE) MCG/ACT inhaler  Commonly known as:  PROVENTIL HFA;VENTOLIN HFA  Inhale 2 puffs into the lungs every 6 (six) hours as needed for wheezing or shortness of breath.     apixaban 5 MG Tabs tablet  Commonly known as:  ELIQUIS  Take 1 tablet (5 mg total) by mouth 2 (two) times daily.     atorvastatin 20 MG tablet  Commonly known as:  LIPITOR  Take 1 tablet (20 mg total) by mouth daily.     bumetanide 1 MG tablet  Commonly known as:  BUMEX  TAKE ONE (1) TABLET EACH DAY     calcium carbonate 600 MG Tabs tablet  Commonly known as:  OS-CAL  Take 600 mg by mouth 2 (two) times daily with a meal.     diltiazem 30 MG tablet  Commonly known as:  CARDIZEM  Take 1 tablet (30 mg total) by mouth every 6 (six) hours.     dofetilide 250 MCG capsule  Commonly known as:  TIKOSYN  Take 1 capsule (250 mcg total) by mouth 2 (two) times daily.     lisinopril 10 MG tablet  Commonly known as:  PRINIVIL,ZESTRIL  Take 1 tablet (10 mg total) by mouth daily.     loratadine 10 MG tablet  Commonly known as:  CLARITIN  Take 10 mg by mouth daily.     potassium chloride 10 MEQ tablet  Commonly known as:  KLOR-CON 10  TAKE ONE (1) TABLET EACH DAY     PROBIOTIC ACIDOPHILUS Tabs  Take 1 tablet by mouth daily.     psyllium 95 % Pack  Commonly known as:  HYDROCIL/METAMUCIL  Take 1 packet by mouth 2 (two) times daily.       Follow-up Information    Follow up with [facility] On 08/16/2015.   Specialty:  Cardiology   Why:  10:00AM  (visit, labs, EKG)   Contact information:     [address] [id number] [place] [phone]      Follow up with [facility] On 09/06/2015.   Specialty:  Cardiology   Why:  8:30AM    Contact information:   [id number] [place]  [address] [phone]       Discharge Instructions: No HCTZ (hydrocholrathiazide) or VERAPAMIL while taking Tikosyn  You will have lab work and EKG at your appointment on Friday.   Heart Healthy Diet     Signed: [id number] Nurse Practitioner-Certified [facility]: HEARTCARE 08/11/2015, 3:12 PM  Time spent on discharge : > 30 minutes.     EP Attending  Patient seen and examined. She has done well with her Tikosyn but her dose was reduced due to QT prolongation. She is stable for DC home.   Mikle Bosworth.D.

## 2015-08-11 NOTE — Progress Notes (Signed)
Patient ID: Tracy Miles, female   DOB: 03-10-1934, 81 y.o.   MRN: [id number]    Patient Name: Tracy Miles Date of Encounter: 08/11/2015     Active Problems:   Atrial fibrillation ([facility])   Atrial fibrillation with RVR ([place])   Morbid obesity due to excess calories ([place])   Sleep apnea    SUBJECTIVE  "I want to go home". Denies chest pain or sob.   CURRENT MEDS . apixaban  5 mg Oral BID  . atorvastatin  20 mg Oral Daily  . calcium carbonate  1 tablet Oral BID WC  . diltiazem  30 mg Oral 4 times per day  . dofetilide  250 mcg Oral BID  . furosemide  40 mg Intravenous Once  . lisinopril  10 mg Oral Daily  . psyllium  1 packet Oral BID  . sodium chloride  3 mL Intravenous Q12H  . sodium chloride  3 mL Intravenous Q12H    OBJECTIVE  Filed Vitals:   08/10/15 1626 08/10/15 1739 08/10/15 2003 08/11/15 0421  BP: 123/57 134/57 129/80 140/70  Pulse: 68  74 75  Temp: 98.1 F (36.7 C)  98.4 F (36.9 C) 98 F (36.7 C)  TempSrc: Oral  Oral Oral  Resp: 18   24  Height:      Weight:    264 lb 9.6 oz (120.022 kg)  SpO2: 97%  95% 97%    Intake/Output Summary (Last 24 hours) at 08/11/15 0919 Last data filed at 08/11/15 0518  Gross per 24 hour  Intake    360 ml  Output   3300 ml  Net  -2940 ml   Filed Weights   08/09/15 0500 08/10/15 0356 08/11/15 0421  Weight: 264 lb 4.8 oz (119.886 kg) 264 lb 6.4 oz (119.931 kg) 264 lb 9.6 oz (120.022 kg)    PHYSICAL EXAM  General: Pleasant, NAD. Neuro: Alert and oriented X 3. Moves all extremities spontaneously. Psych: Normal affect. HEENT:  Normal  Neck: Supple without bruits or JVD. Lungs:  Resp regular and unlabored, CTA. Heart: RRR no s3, s4, or murmurs. Abdomen: Soft, non-tender, non-distended, BS + x 4.  Extremities: No clubbing, cyanosis or edema. DP/PT/Radials 2+ and equal bilaterally.  Accessory Clinical Findings  CBC No results for input(s): WBC, NEUTROABS, HGB, HCT, MCV, PLT in the last 72 hours. Basic  Metabolic Panel  Recent Labs  08/09/15 0335 08/09/15 0733  NA  --  136  K  --  3.9  CL  --  103  CO2  --  25  GLUCOSE  --  102*  BUN  --  12  CREATININE  --  0.95  CALCIUM  --  8.6*  MG 2.1  --    Liver Function Tests No results for input(s): AST, ALT, ALKPHOS, BILITOT, PROT, ALBUMIN in the last 72 hours. No results for input(s): LIPASE, AMYLASE in the last 72 hours. Cardiac Enzymes No results for input(s): CKTOTAL, CKMB, CKMBINDEX, TROPONINI in the last 72 hours. BNP Invalid input(s): POCBNP D-Dimer No results for input(s): DDIMER in the last 72 hours. Hemoglobin A1C No results for input(s): HGBA1C in the last 72 hours. Fasting Lipid Panel No results for input(s): CHOL, HDL, LDLCALC, TRIG, CHOLHDL, LDLDIRECT in the last 72 hours. Thyroid Function Tests No results for input(s): TSH, T4TOTAL, T3FREE, THYROIDAB in the last 72 hours.  Invalid input(s): FREET3  TELE  NSR with PAC's. ECG  NSR with PAC's, QTC 470  Radiology/Studies  Dg Chest 2 View  08/05/2015  CLINICAL DATA:  Shortness of breath, cough EXAM: CHEST  2 VIEW COMPARISON:  None. FINDINGS: Diffuse interstitial prominence throughout the lungs. Heart is mildly enlarged. No effusions. No acute bony abnormality. IMPRESSION: Diffuse interstitial prominence throughout the lungs which could reflect chronic interstitial disease. Chronicity is difficult to determine without old films. Edema is possible. Electronically Signed   By: Rolm Baptise M.D.   On: 08/05/2015 16:18    ASSESSMENT AND PLAN  1. Atrial fib with a RVR 2. S/p initiation of Tikosyn, with prolongation of the QT interval, now with dose adjusted and acceptable QT interval 3. Chronic diastolic heart failure Rec: ok for discharge. Continue home meds. No verapamil or HCTZ may be used with Tikosyn. Followup in a week for a 12 lead ECG and labs (BMP and Mg)  Gregg Taylor,M.D.  08/11/2015 9:19 AM

## 2015-08-12 ENCOUNTER — Encounter (HOSPITAL_COMMUNITY): Payer: Self-pay | Admitting: Cardiology

## 2015-08-16 ENCOUNTER — Ambulatory Visit (HOSPITAL_COMMUNITY)
Admit: 2015-08-16 | Discharge: 2015-08-16 | Disposition: A | Discharge status: Home / Self Care | Payer: Medicare Other | Source: Ambulatory Visit | Attending: Nurse Practitioner | Admitting: Nurse Practitioner

## 2015-08-16 ENCOUNTER — Encounter (HOSPITAL_COMMUNITY): Payer: Self-pay | Admitting: Nurse Practitioner

## 2015-08-16 VITALS — BP 138/82 | HR 71 | Ht 62.0 in | Wt 260.8 lb

## 2015-08-16 DIAGNOSIS — R609 Edema, unspecified: Secondary | ICD-10-CM | POA: Insufficient documentation

## 2015-08-16 DIAGNOSIS — I4891 Unspecified atrial fibrillation: Secondary | ICD-10-CM | POA: Insufficient documentation

## 2015-08-16 LAB — BASIC METABOLIC PANEL
ANION GAP: 8 (ref 5–15)
BUN: 15 mg/dL (ref 6–20)
CHLORIDE: 99 mmol/L — AB (ref 101–111)
CO2: 29 mmol/L (ref 22–32)
CREATININE: 0.91 mg/dL (ref 0.44–1.00)
Calcium: 9.6 mg/dL (ref 8.9–10.3)
GFR calc non Af Amer: 58 mL/min — ABNORMAL LOW (ref >60)
GLUCOSE: 104 mg/dL — AB (ref 65–99)
Potassium: 4.4 mmol/L (ref 3.5–5.1)
Sodium: 136 mmol/L (ref 135–145)

## 2015-08-16 LAB — MAGNESIUM: Magnesium: 2.2 mg/dL (ref 1.7–2.4)

## 2015-08-16 NOTE — Progress Notes (Signed)
Patient ID: Tracy Miles, female   DOB: [birth year], 81 y.o.   MRN: [id number]     Primary Care Physician: [name], MD Referring Physician: [facility] f/u Cardiologist: Dr. Lillette Boxer Chavana is a 81 y.o. female with a h/o persistent afib that had been on amiodarone for sone time but stopped drug due to corneal scarring. She presented to [facility] for fluid overload/afib with rvr. She noticed 10 lb weight gain the week before admission, but did not feel the irregular heartbeat. Decision was made to start tiksoyn and she was discharged home on 250 mg tikosyn with qtc stable at 470. Verapamil and HCTZ stopped with start of tikosyn.   She presents to afib clinic today in [place]. She is taking tikosyn on a regular basis. QTc stable today at 458 ms. She is feeling better. Weight on d/c 264 and 260 lbs today. Continues on apixaban.  Today, she denies symptoms of palpitations, chest pain, shortness of breath, orthopnea, PND, lower extremity edema, dizziness, presyncope, syncope, or neurologic sequela. The patient is tolerating medications without difficulties and is otherwise without complaint today.   Past Medical History  Diagnosis Date  . Hypertension   . Heart murmur   . Coronary artery disease   . Atrial fibrillation ([place])   . Anemia   . Arthritis   . Colon polyp   . Personal history of DVT (deep vein thrombosis) X 2    "left"  . Hyperlipidemia   . Pancreatitis     post hysterectomy  . Sleep apnea   . Aortic stenosis     Very mild  . Hypercholesteremia   . OSA on CPAP   . PAF (paroxysmal atrial fibrillation) ([place])   . History of valvular heart disease     LEFT  . Chicken pox as a child  . Measles as a child  . Mumps child and teenager  . Allergy   . Obesity   . Unspecified constipation 11/16/2013  . Heme positive stool 11/16/2013  . DDD (degenerative disc disease) 11/16/2013  . Unspecified sleep apnea 11/19/2013  . Allergic state 11/19/2013  . OA (osteoarthritis) 11/19/2013   S/p L TKR  . Benign paroxysmal positional vertigo 12/17/2013  . Abnormal results of thyroid function studies 12/17/2013  . Iron deficiency anemia 11/13/2013  . Personal history of colonic polyps 02/25/2014  . Spinal stenosis   . Obstructive sleep apnea 02/22/2015  . Visit for monitoring Tikosyn therapy 08/11/2015   Past Surgical History  Procedure Laterality Date  . Tonsilectomy, adenoidectomy, bilateral myringotomy and tubes  child  . Lumbar laminectomy  40 yrs ago    "L3-4"  . Tubal ligation  81 years old  . Total abdominal hysterectomy  1995    had 2 tumors- benign  . Replacement total knee Left 2013  . Cyst removal hand Bilateral 1990's    "played to much golf"  . Meniscus repair Bilateral 2000 and 2010  . Appendectomy    . Tonsillectomy and adenoidectomy    . Joint replacement    . Back surgery    . Cataract extraction w/ intraocular lens  implant, bilateral Bilateral 2016  . Dilation and curettage of uterus    . Cardioversion N/A 08/09/2015    Procedure: CARDIOVERSION;  Surgeon: Jerline Pain, MD;  [facility];  Service: Cardiovascular;  Laterality: N/A;    Current Outpatient Prescriptions  Medication Sig Dispense Refill  . acetaminophen (TYLENOL) 650 MG CR tablet Take 1,300 mg by mouth every evening.    Marland Kitchen  albuterol (PROVENTIL HFA;VENTOLIN HFA) 108 (90 BASE) MCG/ACT inhaler Inhale 2 puffs into the lungs every 6 (six) hours as needed for wheezing or shortness of breath.    Marland Kitchen apixaban (ELIQUIS) 5 MG TABS tablet Take 1 tablet (5 mg total) by mouth 2 (two) times daily. 180 tablet 1  . atorvastatin (LIPITOR) 20 MG tablet Take 1 tablet (20 mg total) by mouth daily. 90 tablet 2  . bumetanide (BUMEX) 1 MG tablet TAKE ONE (1) TABLET EACH DAY 90 tablet 0  . calcium carbonate (OS-CAL) 600 MG TABS tablet Take 600 mg by mouth 2 (two) times daily with a meal.    . diltiazem (CARDIZEM) 30 MG tablet Take 1 tablet (30 mg total) by mouth every 6 (six) hours. 28 tablet 0  . dofetilide  (TIKOSYN) 250 MCG capsule Take 1 capsule (250 mcg total) by mouth 2 (two) times daily. 60 capsule 11  . Lactobacillus (PROBIOTIC ACIDOPHILUS) TABS Take 1 tablet by mouth daily.    Marland Kitchen lisinopril (PRINIVIL,ZESTRIL) 10 MG tablet Take 1 tablet (10 mg total) by mouth daily. 90 tablet 2  . loratadine (CLARITIN) 10 MG tablet Take 10 mg by mouth daily.     . potassium chloride (KLOR-CON 10) 10 MEQ tablet TAKE ONE (1) TABLET EACH DAY 90 tablet 2  . psyllium (HYDROCIL/METAMUCIL) 95 % PACK Take 1 packet by mouth 2 (two) times daily. 56 each 3   No current facility-administered medications for this encounter.    Allergies  Allergen Reactions  . Gabapentin Swelling  . Lactose Intolerance (Gi) Other (See Comments)    Bothers her stomach  . Zebeta [Bisoprolol Fumarate] Nausea Only  . Penicillins Rash    Has patient had a PCN reaction causing immediate rash, facial/tongue/throat swelling, SOB or lightheadedness with hypotension Has patient had a PCN reaction causing severe rash involving mucus membranes or skin necrosis: Has patient had a PCN reaction that required hospitalization  Has patient had a PCN reaction occurring within the last 10 years: If all of the above answers are "NO", then may proceed with Cephalosporin use.  . Sulfa Antibiotics Rash    Social History   Social History  . Marital Status: Widowed    Spouse Name: N/A  . Number of Children: 3  . Years of Education: N/A   Occupational History  . retired Pharmacist, hospital    Social History Main Topics  . Smoking status: Never Smoker   . Smokeless tobacco: Never Used     Comment: never used tobacco  . Alcohol Use: No  . Drug Use: No  . Sexual Activity: No     Comment: lives at [place], low sodium diet   Other Topics Concern  . Not on file   Social History Narrative    Family History  Problem Relation Age of Onset  . Heart attack Mother 57  . Hyperlipidemia Mother     ?  Marland Kitchen Dementia Mother   . Pernicious anemia Mother   .  COPD Father     smoker  . Cancer Father 13    prostate  . Heart disease Brother     quadruple bipass surgery  . Hyperlipidemia Brother   . Hypertension Brother   . Diabetes Maternal Grandmother     type 2  . Pernicious anemia Maternal Grandmother   . Colon cancer Neg Hx   . Pancreatic cancer Neg Hx   . Stomach cancer Neg Hx   . Throat cancer Neg Hx   . Liver disease Neg  Hx   . Gout Son   . Atrial fibrillation Son   . Hyperlipidemia Son   . Cancer Maternal Grandfather     liver  . Cancer Paternal Grandmother     lung- doesn't think she smokes?  . Stroke Paternal Grandfather   . Cancer Son 50    non hodgin's lymphoma  . Gout Son   . Hyperlipidemia Son   . Sleep apnea Son     ROS- All systems are reviewed and negative except as per the HPI above  Physical Exam: Filed Vitals:   08/16/15 0956  BP: 138/82  Pulse: 71  Height: 5\' 2"  (1.575 m)  Weight: 260 lb 12.8 oz (118.298 kg)    GEN- The patient is well appearing, alert and oriented x 3 today.   Head- normocephalic, atraumatic Eyes-  Sclera clear, conjunctiva pink Ears- hearing intact Oropharynx- clear Neck- supple, no JVP Lymph- no cervical lymphadenopathy Lungs- Clear to ausculation bilaterally, normal work of breathing Heart- Regular rate and rhythm, no murmurs, rubs or gallops, PMI not laterally displaced GI- soft, NT, ND, + BS Extremities- no clubbing, cyanosis, or edema MS- no significant deformity or atrophy Skin- no rash or lesion Psych- euthymic mood, full affect Neuro- strength and sensation are intact  EKG- SR with short PR, at 71 bpm, Pr int 80 ms, QRS int 76 ms, QTc 458 ms. Epic records reviewed  Assessment and Plan: 1. Afib Maintaining SR on tikosyn  QTc stable Taking on regular basis Compliant with apixaban Bmet/mag today  2. Edema Wt stable Checking wt daily  F/u with afib clinic 1/5  Butch Penny C. Govanni Plemons, [facility] [address] [place], [place]  [address] [phone]

## 2015-09-05 ENCOUNTER — Ambulatory Visit (HOSPITAL_COMMUNITY)
Admission: RE | Admit: 2015-09-05 | Discharge: 2015-09-05 | Disposition: A | Discharge status: Home / Self Care | Payer: Medicare Other | Source: Ambulatory Visit | Attending: Nurse Practitioner | Admitting: Nurse Practitioner

## 2015-09-05 ENCOUNTER — Encounter (HOSPITAL_COMMUNITY): Payer: Self-pay | Admitting: Nurse Practitioner

## 2015-09-05 VITALS — BP 132/80 | HR 78 | Ht 62.0 in | Wt 260.0 lb

## 2015-09-05 DIAGNOSIS — I481 Persistent atrial fibrillation: Secondary | ICD-10-CM | POA: Diagnosis not present

## 2015-09-05 DIAGNOSIS — I4891 Unspecified atrial fibrillation: Secondary | ICD-10-CM | POA: Diagnosis not present

## 2015-09-05 DIAGNOSIS — I4819 Other persistent atrial fibrillation: Secondary | ICD-10-CM

## 2015-09-05 NOTE — Progress Notes (Signed)
Patient ID: Tracy Miles, female   DOB: [birth year], 82 y.o.   MRN: [id number]     Primary Care Physician: Tracy Homans, MD Referring Physician: Dr. Lillette Boxer Miles is a 82 y.o. female with a h/o persistent afib for f/u tikosyn use. Pt is doing well on tikosyn and no reported afib. She continues on apixaban without bleeding issues.She does have labs drawn with her PCP and I have asked that a bmet/mag is drawn and faxed to me, K+/mag were normal one month ago. Qtc stable at 462 ms.  Today, she denies symptoms of palpitations, chest pain, shortness of breath, orthopnea, PND, lower extremity edema, dizziness, presyncope, syncope, or neurologic sequela. The patient is tolerating medications without difficulties and is otherwise without complaint today.   Past Medical History  Diagnosis Date  . Hypertension   . Heart murmur   . Coronary artery disease   . Atrial fibrillation ([place])   . Anemia   . Arthritis   . Colon polyp   . Personal history of DVT (deep vein thrombosis) X 2    "left"  . Hyperlipidemia   . Pancreatitis     post hysterectomy  . Sleep apnea   . Aortic stenosis     Very mild  . Hypercholesteremia   . OSA on CPAP   . PAF (paroxysmal atrial fibrillation) ([place])   . History of valvular heart disease     LEFT  . Chicken pox as a child  . Measles as a child  . Mumps child and teenager  . Allergy   . Obesity   . Unspecified constipation 11/16/2013  . Heme positive stool 11/16/2013  . DDD (degenerative disc disease) 11/16/2013  . Unspecified sleep apnea 11/19/2013  . Allergic state 11/19/2013  . OA (osteoarthritis) 11/19/2013    S/p L TKR  . Benign paroxysmal positional vertigo 12/17/2013  . Abnormal results of thyroid function studies 12/17/2013  . Iron deficiency anemia 11/13/2013  . Personal history of colonic polyps 02/25/2014  . Spinal stenosis   . Obstructive sleep apnea 02/22/2015  . Visit for monitoring Tikosyn therapy 08/11/2015   Past Surgical  History  Procedure Laterality Date  . Tonsilectomy, adenoidectomy, bilateral myringotomy and tubes  child  . Lumbar laminectomy  40 yrs ago    "L3-4"  . Tubal ligation  82 years old  . Total abdominal hysterectomy  1995    had 2 tumors- benign  . Replacement total knee Left 2013  . Cyst removal hand Bilateral 1990's    "played to much golf"  . Meniscus repair Bilateral 2000 and 2010  . Appendectomy    . Tonsillectomy and adenoidectomy    . Joint replacement    . Back surgery    . Cataract extraction w/ intraocular lens  implant, bilateral Bilateral 2016  . Dilation and curettage of uterus    . Cardioversion N/A 08/09/2015    Procedure: CARDIOVERSION;  Surgeon: Jerline Pain, MD;  Location: [facility] ENDOSCOPY;  Service: Cardiovascular;  Laterality: N/A;    Current Outpatient Prescriptions  Medication Sig Dispense Refill  . acetaminophen (TYLENOL) 650 MG CR tablet Take 1,300 mg by mouth every evening.    Marland Kitchen albuterol (PROVENTIL HFA;VENTOLIN HFA) 108 (90 BASE) MCG/ACT inhaler Inhale 2 puffs into the lungs every 6 (six) hours as needed for wheezing or shortness of breath.    Marland Kitchen apixaban (ELIQUIS) 5 MG TABS tablet Take 1 tablet (5 mg total) by mouth 2 (two) times daily. 180 tablet 1  .  atorvastatin (LIPITOR) 20 MG tablet Take 1 tablet (20 mg total) by mouth daily. 90 tablet 2  . bumetanide (BUMEX) 1 MG tablet TAKE ONE (1) TABLET EACH DAY 90 tablet 0  . calcium carbonate (OS-CAL) 600 MG TABS tablet Take 600 mg by mouth 2 (two) times daily with a meal.    . diltiazem (CARDIZEM) 30 MG tablet Take 1 tablet (30 mg total) by mouth every 6 (six) hours. 28 tablet 0  . dofetilide (TIKOSYN) 250 MCG capsule Take 1 capsule (250 mcg total) by mouth 2 (two) times daily. 60 capsule 11  . Lactobacillus (PROBIOTIC ACIDOPHILUS) TABS Take 1 tablet by mouth daily.    Marland Kitchen lisinopril (PRINIVIL,ZESTRIL) 10 MG tablet Take 1 tablet (10 mg total) by mouth daily. 90 tablet 2  . loratadine (CLARITIN) 10 MG tablet Take 10 mg  by mouth daily.     . potassium chloride (KLOR-CON 10) 10 MEQ tablet TAKE ONE (1) TABLET EACH DAY 90 tablet 2  . psyllium (HYDROCIL/METAMUCIL) 95 % PACK Take 1 packet by mouth 2 (two) times daily. 56 each 3   No current facility-administered medications for this encounter.    Allergies  Allergen Reactions  . Gabapentin Swelling  . Lactose Intolerance (Gi) Other (See Comments)    Bothers her stomach  . Zebeta [Bisoprolol Fumarate] Nausea Only  . Penicillins Rash    Has patient had a PCN reaction causing immediate rash, facial/tongue/throat swelling, SOB or lightheadedness with hypotension Has patient had a PCN reaction causing severe rash involving mucus membranes or skin necrosis: Has patient had a PCN reaction that required hospitalization  Has patient had a PCN reaction occurring within the last 10 years:  If all of the above answers are "NO", then may proceed with Cephalosporin use.  . Sulfa Antibiotics Rash    Social History   Social History  . Marital Status: Widowed    Spouse Name: N/A  . Number of Children: 3  . Years of Education: N/A   Occupational History  . retired Pharmacist, hospital    Social History Main Topics  . Smoking status: Never Smoker   . Smokeless tobacco: Never Used     Comment: never used tobacco  . Alcohol Use: No  . Drug Use: No  . Sexual Activity: No     Comment: lives at [place], low sodium diet   Other Topics Concern  . Not on file   Social History Narrative    Family History  Problem Relation Age of Onset  . Heart attack Mother 23  . Hyperlipidemia Mother     ?  Marland Kitchen Dementia Mother   . Pernicious anemia Mother   . COPD Father     smoker  . Cancer Father 106    prostate  . Heart disease Brother     quadruple bipass surgery  . Hyperlipidemia Brother   . Hypertension Brother   . Diabetes Maternal Grandmother     type 2  . Pernicious anemia Maternal Grandmother   . Colon cancer Neg Hx   . Pancreatic cancer Neg Hx   . Stomach cancer  Neg Hx   . Throat cancer Neg Hx   . Liver disease Neg Hx   . Gout Son   . Atrial fibrillation Son   . Hyperlipidemia Son   . Cancer Maternal Grandfather     liver  . Cancer Paternal Grandmother     lung- doesn't think she smokes?  . Stroke Paternal Grandfather   . Cancer Son 22  non hodgin's lymphoma  . Gout Son   . Hyperlipidemia Son   . Sleep apnea Son     ROS- All systems are reviewed and negative except as per the HPI above  Physical Exam: Filed Vitals:   09/05/15 1403  BP: 132/80  Pulse: 78  Height: 5\' 2"  (1.575 m)  Weight: 260 lb (117.935 kg)    GEN- The patient is well appearing, alert and oriented x 3 today.   Head- normocephalic, atraumatic Eyes-  Sclera clear, conjunctiva pink Ears- hearing intact Oropharynx- clear Neck- supple, no JVP Lymph- no cervical lymphadenopathy Lungs- Clear to ausculation bilaterally, normal work of breathing Heart- Regular rate and rhythm, no murmurs, rubs or gallops, PMI not laterally displaced GI- soft, NT, ND, + BS Extremities- no clubbing, cyanosis, or edema MS- no significant deformity or atrophy Skin- no rash or lesion Psych- euthymic mood, full affect Neuro- strength and sensation are intact  EKG- NSR at 78 bpm, pr int 176 ms, qrs int 80 ms, qtc 462 ms Epic records reviewed  Assessment and Plan: 1. afib  Staying in SR on tikosyn Continue apixaban 5 mg  Bmet/mag to be drawn by pcp tomorrow and faxed to me  F/u afib clinic in 3 months, sooner if needed  Butch Penny C. Abie Killian, [facility] [address] [place], [place] [address] [phone]

## 2015-09-06 ENCOUNTER — Ambulatory Visit (HOSPITAL_COMMUNITY): Payer: Medicare Other | Admitting: Nurse Practitioner

## 2015-09-06 ENCOUNTER — Encounter: Payer: Self-pay | Admitting: Family Medicine

## 2015-09-06 ENCOUNTER — Ambulatory Visit (INDEPENDENT_AMBULATORY_CARE_PROVIDER_SITE_OTHER): Payer: Medicare Other | Admitting: Family Medicine

## 2015-09-06 VITALS — BP 122/82 | HR 82 | Temp 98.0°F | Ht 62.0 in | Wt 261.0 lb

## 2015-09-06 DIAGNOSIS — I5032 Chronic diastolic (congestive) heart failure: Secondary | ICD-10-CM

## 2015-09-06 DIAGNOSIS — Z0000 Encounter for general adult medical examination without abnormal findings: Secondary | ICD-10-CM | POA: Diagnosis not present

## 2015-09-06 DIAGNOSIS — E785 Hyperlipidemia, unspecified: Secondary | ICD-10-CM

## 2015-09-06 DIAGNOSIS — E782 Mixed hyperlipidemia: Secondary | ICD-10-CM | POA: Diagnosis not present

## 2015-09-06 DIAGNOSIS — I10 Essential (primary) hypertension: Secondary | ICD-10-CM

## 2015-09-06 DIAGNOSIS — Z780 Asymptomatic menopausal state: Secondary | ICD-10-CM

## 2015-09-06 DIAGNOSIS — E669 Obesity, unspecified: Secondary | ICD-10-CM | POA: Diagnosis not present

## 2015-09-06 DIAGNOSIS — I4891 Unspecified atrial fibrillation: Secondary | ICD-10-CM | POA: Diagnosis not present

## 2015-09-06 DIAGNOSIS — I4819 Other persistent atrial fibrillation: Secondary | ICD-10-CM

## 2015-09-06 DIAGNOSIS — G4733 Obstructive sleep apnea (adult) (pediatric): Secondary | ICD-10-CM | POA: Diagnosis not present

## 2015-09-06 DIAGNOSIS — Z23 Encounter for immunization: Secondary | ICD-10-CM

## 2015-09-06 DIAGNOSIS — G4730 Sleep apnea, unspecified: Secondary | ICD-10-CM

## 2015-09-06 LAB — LIPID PANEL
CHOLESTEROL: 169 mg/dL (ref 0–200)
HDL: 54 mg/dL (ref >39.00)
LDL CALC: 89 mg/dL (ref 0–99)
NonHDL: 115.28
TRIGLYCERIDES: 129 mg/dL (ref 0.0–149.0)
Total CHOL/HDL Ratio: 3
VLDL: 25.8 mg/dL (ref 0.0–40.0)

## 2015-09-06 LAB — COMPREHENSIVE METABOLIC PANEL
ALBUMIN: 4.5 g/dL (ref 3.5–5.2)
ALK PHOS: 81 U/L (ref 39–117)
ALT: 10 U/L (ref 0–35)
AST: 16 U/L (ref 0–37)
BUN: 14 mg/dL (ref 6–23)
CALCIUM: 10.1 mg/dL (ref 8.4–10.5)
CHLORIDE: 99 meq/L (ref 96–112)
CO2: 32 mEq/L (ref 19–32)
Creatinine, Ser: 0.99 mg/dL (ref 0.40–1.20)
GFR: 57.45 mL/min — AB (ref >60.00)
Glucose, Bld: 103 mg/dL — ABNORMAL HIGH (ref 70–99)
POTASSIUM: 4.1 meq/L (ref 3.5–5.1)
Sodium: 139 mEq/L (ref 135–145)
TOTAL PROTEIN: 7.2 g/dL (ref 6.0–8.3)
Total Bilirubin: 0.8 mg/dL (ref 0.2–1.2)

## 2015-09-06 LAB — MAGNESIUM: MAGNESIUM: 2.2 mg/dL (ref 1.5–2.5)

## 2015-09-06 LAB — CBC
HEMATOCRIT: 42.6 % (ref 36.0–46.0)
HEMOGLOBIN: 14.1 g/dL (ref 12.0–15.0)
MCHC: 33.1 g/dL (ref 30.0–36.0)
MCV: 94.4 fl (ref 78.0–100.0)
PLATELETS: 216 10*3/uL (ref 150.0–400.0)
RBC: 4.52 Mil/uL (ref 3.87–5.11)
RDW: 13 % (ref 11.5–15.5)
WBC: 7 10*3/uL (ref 4.0–10.5)

## 2015-09-06 LAB — TSH: TSH: 2.18 u[IU]/mL (ref 0.35–4.50)

## 2015-09-06 MED ORDER — BUMETANIDE 1 MG PO TABS: ORAL_TABLET | ORAL | Status: DC

## 2015-09-06 MED ORDER — APIXABAN 5 MG PO TABS: 5.0000 mg | ORAL_TABLET | Freq: Two times a day (BID) | ORAL | Status: DC

## 2015-09-06 MED ORDER — POTASSIUM CHLORIDE ER 10 MEQ PO TBCR: EXTENDED_RELEASE_TABLET | ORAL | Status: DC

## 2015-09-06 MED ORDER — ZOSTER VACCINE LIVE 19400 UNT/0.65ML ~~LOC~~ SOLR: 0.6500 mL | Freq: Once | SUBCUTANEOUS | Status: DC

## 2015-09-06 MED ORDER — ATORVASTATIN CALCIUM 20 MG PO TABS: 20.0000 mg | ORAL_TABLET | Freq: Every day | ORAL | Status: DC

## 2015-09-06 MED ORDER — LISINOPRIL 10 MG PO TABS: 10.0000 mg | ORAL_TABLET | Freq: Every day | ORAL | Status: DC

## 2015-09-06 NOTE — Progress Notes (Signed)
Pre visit review using our clinic review tool, if applicable. No additional management support is needed unless otherwise documented below in the visit note. 

## 2015-09-06 NOTE — Assessment & Plan Note (Addendum)
Using new Bipap machine

## 2015-09-06 NOTE — Patient Instructions (Addendum)
Vitamin D 1000 or 2000 IU daily  Check probiotic for how many strains in yours. [employer] makes a 10 strain version 1 cap, [email or web address] Weight yourself daily if gain more than 3# in 24 hours take an extra Bumex and let us know  Diet and Congestive Heart Failure Congestive heart failure (CHF) occurs when the heart does not pump as well as it once did. Many diseases lead to CHF, such as high blood pressure and diabetes.  The heart does not have to work as hard when you make some changes in your diet. If you eat too much salt or drink too much fluid, your body's water content may increase and make your heart work harder. This can worsen your CHF. The following diet will help decrease some of your symptoms.  Please consider letting us refer you to a nutritionist for diet education and guidance. It is sometimes very surprising how much salt can be hidden in packaged foods and at restaurants.  Reduce the Salt in Your Diet Even if you crave salt you can learn to like foods that are lower in salt. Your taste buds will change soon, and you will not miss the salt. Removing salt can bring out flavors that may have been hidden by the salt. Reduce the salt content in your diet by trying the following suggestions:  1. Choose plenty of fresh fruits and vegetables. They contain only small amounts of salt. Choose foods that are low in salt, such as fresh meats, poultry, fish, dry and fresh legumes, eggs, milk and yogurt. Plain rice, pasta and oatmeal are good low-sodium choices. However, the sodium content can increase if salt or other high-sodium ingredients are added during their preparation.  2. Season with herbs, spices, herbed vinegar and fruit juices. Avoid herb or spice mixtures that contain salt or sodium. Use lemon juice or fresh ground pepper to accent natural flavors. Try orange or pineapple juice as a base for meat marinades.   3.Read food labels before you buy packaged foods. Check the  nutrition facts on the label for sodium content per serving. Find out the number of servings in the package. How does the sodium in each serving compare to the total sodium you can eat each day? Try to pick packaged foods with a sodium content less than 350 milligrams for each serving. It is also useful to check the list of ingredients. If salt or sodium is listed in the first five ingredients, it is too high in sodium.  When Checking Labels: Use the nutrition information included on packaged foods. Be sure to notice the number of servings per container. Here are tips for using this information.  1. Nutrient List - The list covers nutrients most important to your health.  2.% Daily Value - This number shows how foods meet recommended nutrient intake levels for a 2,000 calorie reference diet. Try to eat no more than 100 percent of total fat, cholesterol and sodium.  3. Daily Values Footnote - Some food labels list daily values for 2,000 and 2,500 calorie daily diets.  4. Calories Per Gram Footnote - Some labels give the approximate number of calories in a gram of fat, carbohydrate and protein.  5.Sodium Content - Always check the sodium content. Look for foods with a sodium content less than 350 milligrams for each serving.  When Cooking or Preparing Food: 1.Shake the habit. Remove the salt shaker from the kitchen counter and table. A 1/8 teaspoon "salt shake" adds more than 250 milligrams  of sodium to your dish.  2.Be creative. Instead of adding salt, spark up the flavor with herbs and spices, garlic, onions and citrus juices.  3.Be a low-salt cook. In most recipes, you can cut back on salt by 50 percent or even eliminate it altogether. You can bake, broil, grill, roast, poach, steam or microwave foods without salt. Skip the urge to add salt to cooking water for pasta, rice, cereal and vegetables. It is an easy way to cut back on sodium.  4.Be careful with condiments. High-sodium condiments  include various flavored salts, lemon pepper, garlic salt, onion salt, meat tenderizers, flavor enhancers, bouillon cubes, catsup, mustard, steak sauce and soy sauce.  5.Stay away from hidden salt. Canned and processed foods, such as gravies, instant cereal, packaged noodles and potato mixes, olives, pickles, soups and vegetables are high in salt. Choose the frozen item instead; or better yet, choose fresh foods when you can. Cheeses, cured meats (such as bacon, bologna, hot dogs and sausages), fast foods and frozen foods also may contain a lot of sodium.  When Eating Out: A low-sodium diet does not need to spoil the pleasure of a restaurant meal. However, you will have to be careful when ordering. Here are some tips for meals away from home:  1.Move the salt shaker to another table. Ask for a lemon wedge or bring your own herb blend to enhance the food's flavor.  2.Recognize menu terms that may indicate a high sodium content: pickled, au jus, soy sauce or in broth.  3.Select raw vegetables or fresh fruit rather than salty snacks.  4.Go easy on condiments such as mustard, catsup, pickles and tartar sauce. Choose lettuce, onions and tomatoes. Remember that bacon and cheeses are high in sodium.  5.Request that the cook prepare foods without adding salt or MSG. Or ask for sauces and salad dressings on the side since they are often high in sodium. For a salad, use a twist of lemon, a splash of vinegar or a light drizzle of dressing  Preventive Care for Adults, Female A healthy lifestyle and preventive care can promote health and wellness. Preventive health guidelines for women include the following key practices.  A routine yearly physical is a good way to check with your health care provider about your health and preventive screening. It is a chance to share any concerns and updates on your health and to receive a thorough exam.  Visit your dentist for a routine exam and preventive care every 6  months. Brush your teeth twice a day and floss once a day. Good oral hygiene prevents tooth decay and gum disease.  The frequency of eye exams is based on your age, health, family medical history, use of contact lenses, and other factors. Follow your health care provider's recommendations for frequency of eye exams.  Eat a healthy diet. Foods like vegetables, fruits, whole grains, low-fat dairy products, and lean protein foods contain the nutrients you need without too many calories. Decrease your intake of foods high in solid fats, added sugars, and salt. Eat the right amount of calories for you.Get information about a proper diet from your health care provider, if necessary.  Regular physical exercise is one of the most important things you can do for your health. Most adults should get at least 150 minutes of moderate-intensity exercise (any activity that increases your heart rate and causes you to sweat) each week. In addition, most adults need muscle-strengthening exercises on 2 or more days a week.  Maintain  a healthy weight. The body mass index (BMI) is a screening tool to identify possible weight problems. It provides an estimate of body fat based on height and weight. Your health care provider can find your BMI and can help you achieve or maintain a healthy weight.For adults 20 years and older:  A BMI below 18.5 is considered underweight.  A BMI of 18.5 to 24.9 is normal.  A BMI of 25 to 29.9 is considered overweight.  A BMI of 30 and above is considered obese.  Maintain normal blood lipids and cholesterol levels by exercising and minimizing your intake of saturated fat. Eat a balanced diet with plenty of fruit and vegetables. Blood tests for lipids and cholesterol should begin at age 4 and be repeated every 5 years. If your lipid or cholesterol levels are high, you are over 50, or you are at high risk for heart disease, you may need your cholesterol levels checked more  frequently.Ongoing high lipid and cholesterol levels should be treated with medicines if diet and exercise are not working.  If you smoke, find out from your health care provider how to quit. If you do not use tobacco, do not start.  Lung cancer screening is recommended for adults aged 76-80 years who are at high risk for developing lung cancer because of a history of smoking. A yearly low-dose CT scan of the lungs is recommended for people who have at least a 30-pack-year history of smoking and are a current smoker or have quit within the past 15 years. A pack year of smoking is smoking an average of 1 pack of cigarettes a day for 1 year (for example: 1 pack a day for 30 years or 2 packs a day for 15 years). Yearly screening should continue until the smoker has stopped smoking for at least 15 years. Yearly screening should be stopped for people who develop a health problem that would prevent them from having lung cancer treatment.  If you are pregnant, do not drink alcohol. If you are breastfeeding, be very cautious about drinking alcohol. If you are not pregnant and choose to drink alcohol, do not have more than 1 drink per day. One drink is considered to be 12 ounces (355 mL) of beer, 5 ounces (148 mL) of wine, or 1.5 ounces (44 mL) of liquor.  Avoid use of street drugs. Do not share needles with anyone. Ask for help if you need support or instructions about stopping the use of drugs.  High blood pressure causes heart disease and increases the risk of stroke. Your blood pressure should be checked at least every 1 to 2 years. Ongoing high blood pressure should be treated with medicines if weight loss and exercise do not work.  If you are 36-23 years old, ask your health care provider if you should take aspirin to prevent strokes.  Diabetes screening is done by taking a blood sample to check your blood glucose level after you have not eaten for a certain period of time (fasting). If you are not  overweight and you do not have risk factors for diabetes, you should be screened once every 3 years starting at age 74. If you are overweight or obese and you are 55-69 years of age, you should be screened for diabetes every year as part of your cardiovascular risk assessment.  Breast cancer screening is essential preventive care for women. You should practice "breast self-awareness." This means understanding the normal appearance and feel of your breasts and may  include breast self-examination. Any changes detected, no matter how small, should be reported to a health care provider. Women in their 21s and 30s should have a clinical breast exam (CBE) by a health care provider as part of a regular health exam every 1 to 3 years. After age 62, women should have a CBE every year. Starting at age 48, women should consider having a mammogram (breast X-ray test) every year. Women who have a family history of breast cancer should talk to their health care provider about genetic screening. Women at a high risk of breast cancer should talk to their health care providers about having an MRI and a mammogram every year.  Breast cancer gene (BRCA)-related cancer risk assessment is recommended for women who have family members with BRCA-related cancers. BRCA-related cancers include breast, ovarian, tubal, and peritoneal cancers. Having family members with these cancers may be associated with an increased risk for harmful changes (mutations) in the breast cancer genes BRCA1 and BRCA2. Results of the assessment will determine the need for genetic counseling and BRCA1 and BRCA2 testing.  Your health care provider may recommend that you be screened regularly for cancer of the pelvic organs (ovaries, uterus, and vagina). This screening involves a pelvic examination, including checking for microscopic changes to the surface of your cervix (Pap test). You may be encouraged to have this screening done every 3 years, beginning at age  17.  For women ages 71-65, health care providers may recommend pelvic exams and Pap testing every 3 years, or they may recommend the Pap and pelvic exam, combined with testing for human papilloma virus (HPV), every 5 years. Some types of HPV increase your risk of cervical cancer. Testing for HPV may also be done on women of any age with unclear Pap test results.  Other health care providers may not recommend any screening for nonpregnant women who are considered low risk for pelvic cancer and who do not have symptoms. Ask your health care provider if a screening pelvic exam is right for you.  If you have had past treatment for cervical cancer or a condition that could lead to cancer, you need Pap tests and screening for cancer for at least 20 years after your treatment. If Pap tests have been discontinued, your risk factors (such as having a new sexual partner) need to be reassessed to determine if screening should resume. Some women have medical problems that increase the chance of getting cervical cancer. In these cases, your health care provider may recommend more frequent screening and Pap tests.  Colorectal cancer can be detected and often prevented. Most routine colorectal cancer screening begins at the age of 21 years and continues through age 58 years. However, your health care provider may recommend screening at an earlier age if you have risk factors for colon cancer. On a yearly basis, your health care provider may provide home test kits to check for hidden blood in the stool. Use of a small camera at the end of a tube, to directly examine the colon (sigmoidoscopy or colonoscopy), can detect the earliest forms of colorectal cancer. Talk to your health care provider about this at age 33, when routine screening begins. Direct exam of the colon should be repeated every 5-10 years through age 4 years, unless early forms of precancerous polyps or small growths are found.  People who are at an  increased risk for hepatitis B should be screened for this virus. You are considered at high risk for hepatitis B  if:  You were born in a country where hepatitis B occurs often. Talk with your health care provider about which countries are considered high risk.  Your parents were born in a high-risk country and you have not received a shot to protect against hepatitis B (hepatitis B vaccine).  You have HIV or AIDS.  You use needles to inject street drugs.  You live with, or have sex with, someone who has hepatitis B.  You get hemodialysis treatment.  You take certain medicines for conditions like cancer, organ transplantation, and autoimmune conditions.  Hepatitis C blood testing is recommended for all people born from [birth year] through 1963 and any individual with known risks for hepatitis C.  Practice safe sex. Use condoms and avoid high-risk sexual practices to reduce the spread of sexually transmitted infections (STIs). STIs include gonorrhea, chlamydia, syphilis, trichomonas, herpes, HPV, and human immunodeficiency virus (HIV). Herpes, HIV, and HPV are viral illnesses that have no cure. They can result in disability, cancer, and death.  You should be screened for sexually transmitted illnesses (STIs) including gonorrhea and chlamydia if:  You are sexually active and are younger than 24 years.  You are older than 24 years and your health care provider tells you that you are at risk for this type of infection.  Your sexual activity has changed since you were last screened and you are at an increased risk for chlamydia or gonorrhea. Ask your health care provider if you are at risk.  If you are at risk of being infected with HIV, it is recommended that you take a prescription medicine daily to prevent HIV infection. This is called preexposure prophylaxis (PrEP). You are considered at risk if:  You are sexually active and do not regularly use condoms or know the HIV status of your  partner(s).  You take drugs by injection.  You are sexually active with a partner who has HIV.  Talk with your health care provider about whether you are at high risk of being infected with HIV. If you choose to begin PrEP, you should first be tested for HIV. You should then be tested every 3 months for as long as you are taking PrEP.  Osteoporosis is a disease in which the bones lose minerals and strength with aging. This can result in serious bone fractures or breaks. The risk of osteoporosis can be identified using a bone density scan. Women ages 28 years and over and women at risk for fractures or osteoporosis should discuss screening with their health care providers. Ask your health care provider whether you should take a calcium supplement or vitamin D to reduce the rate of osteoporosis.  Menopause can be associated with physical symptoms and risks. Hormone replacement therapy is available to decrease symptoms and risks. You should talk to your health care provider about whether hormone replacement therapy is right for you.  Use sunscreen. Apply sunscreen liberally and repeatedly throughout the day. You should seek shade when your shadow is shorter than you. Protect yourself by wearing long sleeves, pants, a wide-brimmed hat, and sunglasses year round, whenever you are outdoors.  Once a month, do a whole body skin exam, using a mirror to look at the skin on your back. Tell your health care provider of new moles, moles that have irregular borders, moles that are larger than a pencil eraser, or moles that have changed in shape or color.  Stay current with required vaccines (immunizations).  Influenza vaccine. All adults should be immunized every year.  Tetanus, diphtheria, and acellular pertussis (Td, Tdap) vaccine. Pregnant women should receive 1 dose of Tdap vaccine during each pregnancy. The dose should be obtained regardless of the length of time since the last dose. Immunization is  preferred during the 27th-36th week of gestation. An adult who has not previously received Tdap or who does not know her vaccine status should receive 1 dose of Tdap. This initial dose should be followed by tetanus and diphtheria toxoids (Td) booster doses every 10 years. Adults with an unknown or incomplete history of completing a 3-dose immunization series with Td-containing vaccines should begin or complete a primary immunization series including a Tdap dose. Adults should receive a Td booster every 10 years.  Varicella vaccine. An adult without evidence of immunity to varicella should receive 2 doses or a second dose if she has previously received 1 dose. Pregnant females who do not have evidence of immunity should receive the first dose after pregnancy. This first dose should be obtained before leaving the health care facility. The second dose should be obtained 4-8 weeks after the first dose.  Human papillomavirus (HPV) vaccine. Females aged 13-26 years who have not received the vaccine previously should obtain the 3-dose series. The vaccine is not recommended for use in pregnant females. However, pregnancy testing is not needed before receiving a dose. If a female is found to be pregnant after receiving a dose, no treatment is needed. In that case, the remaining doses should be delayed until after the pregnancy. Immunization is recommended for any person with an immunocompromised condition through the age of 15 years if she did not get any or all doses earlier. During the 3-dose series, the second dose should be obtained 4-8 weeks after the first dose. The third dose should be obtained 24 weeks after the first dose and 16 weeks after the second dose.  Zoster vaccine. One dose is recommended for adults aged 27 years or older unless certain conditions are present.  Measles, mumps, and rubella (MMR) vaccine. Adults born before [birth year] generally are considered immune to measles and mumps. Adults born in [birth year]  or later should have 1 or more doses of MMR vaccine unless there is a contraindication to the vaccine or there is laboratory evidence of immunity to each of the three diseases. A routine second dose of MMR vaccine should be obtained at least 28 days after the first dose for students attending postsecondary schools, health care workers, or international travelers. People who received inactivated measles vaccine or an unknown type of measles vaccine during 1963-1967 should receive 2 doses of MMR vaccine. People who received inactivated mumps vaccine or an unknown type of mumps vaccine before 1979 and are at high risk for mumps infection should consider immunization with 2 doses of MMR vaccine. For females of childbearing age, rubella immunity should be determined. If there is no evidence of immunity, females who are not pregnant should be vaccinated. If there is no evidence of immunity, females who are pregnant should delay immunization until after pregnancy. Unvaccinated health care workers born before [birth year] who lack laboratory evidence of measles, mumps, or rubella immunity or laboratory confirmation of disease should consider measles and mumps immunization with 2 doses of MMR vaccine or rubella immunization with 1 dose of MMR vaccine.  Pneumococcal 13-valent conjugate (PCV13) vaccine. When indicated, a person who is uncertain of his immunization history and has no record of immunization should receive the PCV13 vaccine. All adults 52 years of age and older should receive this  vaccine. An adult aged 2 years or older who has certain medical conditions and has not been previously immunized should receive 1 dose of PCV13 vaccine. This PCV13 should be followed with a dose of pneumococcal polysaccharide (PPSV23) vaccine. Adults who are at high risk for pneumococcal disease should obtain the PPSV23 vaccine at least 8 weeks after the dose of PCV13 vaccine. Adults older than 80 years of age who have normal immune system  function should obtain the PPSV23 vaccine dose at least 1 year after the dose of PCV13 vaccine.  Pneumococcal polysaccharide (PPSV23) vaccine. When PCV13 is also indicated, PCV13 should be obtained first. All adults aged 80 years and older should be immunized. An adult younger than age 20 years who has certain medical conditions should be immunized. Any person who resides in a nursing home or long-term care facility should be immunized. An adult smoker should be immunized. People with an immunocompromised condition and certain other conditions should receive both PCV13 and PPSV23 vaccines. People with human immunodeficiency virus (HIV) infection should be immunized as soon as possible after diagnosis. Immunization during chemotherapy or radiation therapy should be avoided. Routine use of PPSV23 vaccine is not recommended for American Indians, [place] Natives, or people younger than 65 years unless there are medical conditions that require PPSV23 vaccine. When indicated, people who have unknown immunization and have no record of immunization should receive PPSV23 vaccine. One-time revaccination 5 years after the first dose of PPSV23 is recommended for people aged 19-64 years who have chronic kidney failure, nephrotic syndrome, asplenia, or immunocompromised conditions. People who received 1-2 doses of PPSV23 before age 56 years should receive another dose of PPSV23 vaccine at age 1 years or later if at least 5 years have passed since the previous dose. Doses of PPSV23 are not needed for people immunized with PPSV23 at or after age 35 years.  Meningococcal vaccine. Adults with asplenia or persistent complement component deficiencies should receive 2 doses of quadrivalent meningococcal conjugate (MenACWY-D) vaccine. The doses should be obtained at least 2 months apart. Microbiologists working with certain meningococcal bacteria, [employer] recruits, people at risk during an outbreak, and people who travel to or live  in countries with a high rate of meningitis should be immunized. A first-year college student up through age 8 years who is living in a residence hall should receive a dose if she did not receive a dose on or after her 16th birthday. Adults who have certain high-risk conditions should receive one or more doses of vaccine.  Hepatitis A vaccine. Adults who wish to be protected from this disease, have certain high-risk conditions, work with hepatitis A-infected animals, work in hepatitis A research labs, or travel to or work in countries with a high rate of hepatitis A should be immunized. Adults who were previously unvaccinated and who anticipate close contact with an international adoptee during the first 60 days after arrival in the [place] from a country with a high rate of hepatitis A should be immunized.  Hepatitis B vaccine. Adults who wish to be protected from this disease, have certain high-risk conditions, may be exposed to blood or other infectious body fluids, are household contacts or sex partners of hepatitis B positive people, are clients or workers in certain care facilities, or travel to or work in countries with a high rate of hepatitis B should be immunized.  Haemophilus influenzae type b (Hib) vaccine. A previously unvaccinated person with asplenia or sickle cell disease or having a scheduled splenectomy should  receive 1 dose of Hib vaccine. Regardless of previous immunization, a recipient of a hematopoietic stem cell transplant should receive a 3-dose series 6-12 months after her successful transplant. Hib vaccine is not recommended for adults with HIV infection. Preventive Services / Frequency Ages 31 to 20 years  Blood pressure check.** / Every 3-5 years.  Lipid and cholesterol check.** / Every 5 years beginning at age 86.  Clinical breast exam.** / Every 3 years for women in their 54s and 59s.  BRCA-related cancer risk assessment.** / For women who have family members with  a BRCA-related cancer (breast, ovarian, tubal, or peritoneal cancers).  Pap test.** / Every 2 years from ages 35 through 61. Every 3 years starting at age 36 through age 33 or 42 with a history of 3 consecutive normal Pap tests.  HPV screening.** / Every 3 years from ages 77 through ages 54 to 89 with a history of 3 consecutive normal Pap tests.  Hepatitis C blood test.** / For any individual with known risks for hepatitis C.  Skin self-exam. / Monthly.  Influenza vaccine. / Every year.  Tetanus, diphtheria, and acellular pertussis (Tdap, Td) vaccine.** / Consult your health care provider. Pregnant women should receive 1 dose of Tdap vaccine during each pregnancy. 1 dose of Td every 10 years.  Varicella vaccine.** / Consult your health care provider. Pregnant females who do not have evidence of immunity should receive the first dose after pregnancy.  HPV vaccine. / 3 doses over 6 months, if 20 and younger. The vaccine is not recommended for use in pregnant females. However, pregnancy testing is not needed before receiving a dose.  Measles, mumps, rubella (MMR) vaccine.** / You need at least 1 dose of MMR if you were born in 1957 or later. You may also need a 2nd dose. For females of childbearing age, rubella immunity should be determined. If there is no evidence of immunity, females who are not pregnant should be vaccinated. If there is no evidence of immunity, females who are pregnant should delay immunization until after pregnancy.  Pneumococcal 13-valent conjugate (PCV13) vaccine.** / Consult your health care provider.  Pneumococcal polysaccharide (PPSV23) vaccine.** / 1 to 2 doses if you smoke cigarettes or if you have certain conditions.  Meningococcal vaccine.** / 1 dose if you are age 39 to 59 years and a Market researcher living in a residence hall, or have one of several medical conditions, you need to get vaccinated against meningococcal disease. You may also need  additional booster doses.  Hepatitis A vaccine.** / Consult your health care provider.  Hepatitis B vaccine.** / Consult your health care provider.  Haemophilus influenzae type b (Hib) vaccine.** / Consult your health care provider. Ages 64 to 43 years  Blood pressure check.** / Every year.  Lipid and cholesterol check.** / Every 5 years beginning at age 84 years.  Lung cancer screening. / Every year if you are aged 51-80 years and have a 30-pack-year history of smoking and currently smoke or have quit within the past 15 years. Yearly screening is stopped once you have quit smoking for at least 15 years or develop a health problem that would prevent you from having lung cancer treatment.  Clinical breast exam.** / Every year after age 35 years.  BRCA-related cancer risk assessment.** / For women who have family members with a BRCA-related cancer (breast, ovarian, tubal, or peritoneal cancers).  Mammogram.** / Every year beginning at age 41 years and continuing for as long as  you are in good health. Consult with your health care provider.  Pap test.** / Every 3 years starting at age 7 years through age 21 or 56 years with a history of 3 consecutive normal Pap tests.  HPV screening.** / Every 3 years from ages 30 years through ages 16 to 19 years with a history of 3 consecutive normal Pap tests.  Fecal occult blood test (FOBT) of stool. / Every year beginning at age 45 years and continuing until age 23 years. You may not need to do this test if you get a colonoscopy every 10 years.  Flexible sigmoidoscopy or colonoscopy.** / Every 5 years for a flexible sigmoidoscopy or every 10 years for a colonoscopy beginning at age 86 years and continuing until age 52 years.  Hepatitis C blood test.** / For all people born from [birth year] through 1963 and any individual with known risks for hepatitis C.  Skin self-exam. / Monthly.  Influenza vaccine. / Every year.  Tetanus, diphtheria, and acellular  pertussis (Tdap/Td) vaccine.** / Consult your health care provider. Pregnant women should receive 1 dose of Tdap vaccine during each pregnancy. 1 dose of Td every 10 years.  Varicella vaccine.** / Consult your health care provider. Pregnant females who do not have evidence of immunity should receive the first dose after pregnancy.  Zoster vaccine.** / 1 dose for adults aged 23 years or older.  Measles, mumps, rubella (MMR) vaccine.** / You need at least 1 dose of MMR if you were born in 1957 or later. You may also need a second dose. For females of childbearing age, rubella immunity should be determined. If there is no evidence of immunity, females who are not pregnant should be vaccinated. If there is no evidence of immunity, females who are pregnant should delay immunization until after pregnancy.  Pneumococcal 13-valent conjugate (PCV13) vaccine.** / Consult your health care provider.  Pneumococcal polysaccharide (PPSV23) vaccine.** / 1 to 2 doses if you smoke cigarettes or if you have certain conditions.  Meningococcal vaccine.** / Consult your health care provider.  Hepatitis A vaccine.** / Consult your health care provider.  Hepatitis B vaccine.** / Consult your health care provider.  Haemophilus influenzae type b (Hib) vaccine.** / Consult your health care provider. Ages 26 years and over  Blood pressure check.** / Every year.  Lipid and cholesterol check.** / Every 5 years beginning at age 40 years.  Lung cancer screening. / Every year if you are aged 57-80 years and have a 30-pack-year history of smoking and currently smoke or have quit within the past 15 years. Yearly screening is stopped once you have quit smoking for at least 15 years or develop a health problem that would prevent you from having lung cancer treatment.  Clinical breast exam.** / Every year after age 66 years.  BRCA-related cancer risk assessment.** / For women who have family members with a BRCA-related  cancer (breast, ovarian, tubal, or peritoneal cancers).  Mammogram.** / Every year beginning at age 31 years and continuing for as long as you are in good health. Consult with your health care provider.  Pap test.** / Every 3 years starting at age 51 years through age 49 or 29 years with 3 consecutive normal Pap tests. Testing can be stopped between 65 and 70 years with 3 consecutive normal Pap tests and no abnormal Pap or HPV tests in the past 10 years.  HPV screening.** / Every 3 years from ages 33 years through ages 17 or 21 years with  a history of 3 consecutive normal Pap tests. Testing can be stopped between 65 and 70 years with 3 consecutive normal Pap tests and no abnormal Pap or HPV tests in the past 10 years.  Fecal occult blood test (FOBT) of stool. / Every year beginning at age 51 years and continuing until age 26 years. You may not need to do this test if you get a colonoscopy every 10 years.  Flexible sigmoidoscopy or colonoscopy.** / Every 5 years for a flexible sigmoidoscopy or every 10 years for a colonoscopy beginning at age 22 years and continuing until age 31 years.  Hepatitis C blood test.** / For all people born from [birth year] through 1963 and any individual with known risks for hepatitis C.  Osteoporosis screening.** / A one-time screening for women ages 11 years and over and women at risk for fractures or osteoporosis.  Skin self-exam. / Monthly.  Influenza vaccine. / Every year.  Tetanus, diphtheria, and acellular pertussis (Tdap/Td) vaccine.** / 1 dose of Td every 10 years.  Varicella vaccine.** / Consult your health care provider.  Zoster vaccine.** / 1 dose for adults aged 65 years or older.  Pneumococcal 13-valent conjugate (PCV13) vaccine.** / Consult your health care provider.  Pneumococcal polysaccharide (PPSV23) vaccine.** / 1 dose for all adults aged 43 years and older.  Meningococcal vaccine.** / Consult your health care provider.  Hepatitis A vaccine.** /  Consult your health care provider.  Hepatitis B vaccine.** / Consult your health care provider.  Haemophilus influenzae type b (Hib) vaccine.** / Consult your health care provider. ** Family history and personal history of risk and conditions may change your health care provider's recommendations.   This information is not intended to replace advice given to you by your health care provider. Make sure you discuss any questions you have with your health care provider.   Document Released: 10/13/2001 Document Revised: 09/07/2014 Document Reviewed: 01/12/2011 [employer] Interactive Patient Education [employer].

## 2015-09-13 ENCOUNTER — Ambulatory Visit (HOSPITAL_BASED_OUTPATIENT_CLINIC_OR_DEPARTMENT_OTHER)
Admission: RE | Admit: 2015-09-13 | Discharge: 2015-09-13 | Disposition: A | Discharge status: Home / Self Care | Payer: Medicare Other | Source: Ambulatory Visit | Attending: Family Medicine | Admitting: Family Medicine

## 2015-09-13 ENCOUNTER — Other Ambulatory Visit: Payer: Self-pay | Admitting: *Deleted

## 2015-09-13 DIAGNOSIS — Z780 Asymptomatic menopausal state: Secondary | ICD-10-CM | POA: Diagnosis not present

## 2015-09-13 DIAGNOSIS — Z90710 Acquired absence of both cervix and uterus: Secondary | ICD-10-CM | POA: Diagnosis not present

## 2015-09-15 ENCOUNTER — Encounter: Payer: Self-pay | Admitting: Family Medicine

## 2015-09-15 DIAGNOSIS — Z0000 Encounter for general adult medical examination without abnormal findings: Secondary | ICD-10-CM | POA: Insufficient documentation

## 2015-09-15 NOTE — Assessment & Plan Note (Signed)
Encouraged DASH diet, decrease po intake and increase exercise as tolerated. Needs 7-8 hours of sleep nightly. Avoid trans fats, eat small, frequent meals every 4-5 hours with lean proteins, complex carbs and healthy fats. Minimize simple carbs, GMO foods. 

## 2015-09-15 NOTE — Assessment & Plan Note (Signed)
Well controlled, no changes to meds. Encouraged heart healthy diet such as the DASH diet and exercise as tolerated.  °

## 2015-09-15 NOTE — Progress Notes (Signed)
Patient ID: Tracy Miles, female   DOB: 10-17-1933, 82 y.o.   MRN: [id number]   Subjective:    Patient ID: Tracy Miles, female    DOB: [birth year], 82 y.o.   MRN: [id number]  Chief Complaint  Patient presents with  . Medicare Wellness    HPI Patient is in today for wellness visit and follow-up on numerous concerns. Overall she feels well today. Continues to struggle with shortness of breath with exertion and was recently hospitalized for an episode of atrial fibrillation with rapid ventricular response. With [facility] she is presently doing better. No fevers or chills. No congestion. She is doing well at home with activities of daily living. She is weighing herself daily and has not had any weight gain since being discharged from the hospital. Has had cataracts removed in both eyes this past year. Denies CP/palp/HA/congestion/fevers/GI or GU c/o. Taking meds as prescribed  Past Medical History  Diagnosis Date  . Hypertension   . Heart murmur   . Coronary artery disease   . Atrial fibrillation ([place])   . Anemia   . Arthritis   . Colon polyp   . Personal history of DVT (deep vein thrombosis) X 2    "left"  . Hyperlipidemia   . Pancreatitis     post hysterectomy  . Sleep apnea   . Aortic stenosis     Very mild  . Hypercholesteremia   . OSA on CPAP   . PAF (paroxysmal atrial fibrillation) ([place])   . History of valvular heart disease     LEFT  . Chicken pox as a child  . Measles as a child  . Mumps child and teenager  . Allergy   . Obesity   . Unspecified constipation 11/16/2013  . Heme positive stool 11/16/2013  . DDD (degenerative disc disease) 11/16/2013  . Unspecified sleep apnea 11/19/2013  . Allergic state 11/19/2013  . OA (osteoarthritis) 11/19/2013    S/p L TKR  . Benign paroxysmal positional vertigo 12/17/2013  . Abnormal results of thyroid function studies 12/17/2013  . Iron deficiency anemia 11/13/2013  . Personal history of colonic polyps 02/25/2014  . Spinal  stenosis   . Obstructive sleep apnea 02/22/2015  . Visit for monitoring Tikosyn therapy 08/11/2015  . Medicare annual wellness visit, subsequent 09/15/2015    Past Surgical History  Procedure Laterality Date  . Tonsilectomy, adenoidectomy, bilateral myringotomy and tubes  child  . Lumbar laminectomy  40 yrs ago    "L3-4"  . Tubal ligation  82 years old  . Total abdominal hysterectomy  1995    had 2 tumors- benign  . Replacement total knee Left 2013  . Cyst removal hand Bilateral 1990's    "played to much golf"  . Meniscus repair Bilateral 2000 and 2010  . Appendectomy    . Tonsillectomy and adenoidectomy    . Joint replacement    . Back surgery    . Cataract extraction w/ intraocular lens  implant, bilateral Bilateral 2016  . Dilation and curettage of uterus    . Cardioversion N/A 08/09/2015    Procedure: CARDIOVERSION;  Surgeon: Jerline Pain, MD;  Location: [facility];  Service: Cardiovascular;  Laterality: N/A;  . Eye surgery  2016    cataracts    Family History  Problem Relation Age of Onset  . Heart attack Mother 68  . Hyperlipidemia Mother     ?  Marland Kitchen Dementia Mother   . Pernicious anemia Mother   . COPD Father  smoker  . Cancer Father 5    prostate  . Heart disease Brother     quadruple bipass surgery  . Hyperlipidemia Brother   . Hypertension Brother   . Diabetes Maternal Grandmother     type 2  . Pernicious anemia Maternal Grandmother   . Colon cancer Neg Hx   . Pancreatic cancer Neg Hx   . Stomach cancer Neg Hx   . Throat cancer Neg Hx   . Liver disease Neg Hx   . Gout Son   . Atrial fibrillation Son   . Hyperlipidemia Son   . Cancer Maternal Grandfather     liver  . Cancer Paternal Grandmother     lung- doesn't think she smokes?  . Stroke Paternal Grandfather   . Cancer Son 50    non hodgin's lymphoma  . Gout Son   . Hyperlipidemia Son   . Sleep apnea Son     Social History   Social History  . Marital Status: Widowed    Spouse Name:  N/A  . Number of Children: 3  . Years of Education: N/A   Occupational History  . retired Pharmacist, hospital    Social History Main Topics  . Smoking status: Never Smoker   . Smokeless tobacco: Never Used     Comment: never used tobacco  . Alcohol Use: No  . Drug Use: No  . Sexual Activity: No     Comment: lives at [place], low sodium diet   Other Topics Concern  . Not on file   Social History Narrative    Outpatient Prescriptions Prior to Visit  Medication Sig Dispense Refill  . acetaminophen (TYLENOL) 650 MG CR tablet Take 1,300 mg by mouth every evening.    . calcium carbonate (OS-CAL) 600 MG TABS tablet Take 600 mg by mouth 2 (two) times daily with a meal.    . diltiazem (CARDIZEM) 30 MG tablet Take 1 tablet (30 mg total) by mouth every 6 (six) hours. 28 tablet 0  . dofetilide (TIKOSYN) 250 MCG capsule Take 1 capsule (250 mcg total) by mouth 2 (two) times daily. 60 capsule 11  . Lactobacillus (PROBIOTIC ACIDOPHILUS) TABS Take 1 tablet by mouth daily.    Marland Kitchen loratadine (CLARITIN) 10 MG tablet Take 10 mg by mouth daily.     . psyllium (HYDROCIL/METAMUCIL) 95 % PACK Take 1 packet by mouth 2 (two) times daily. 56 each 3  . apixaban (ELIQUIS) 5 MG TABS tablet Take 1 tablet (5 mg total) by mouth 2 (two) times daily. 180 tablet 1  . atorvastatin (LIPITOR) 20 MG tablet Take 1 tablet (20 mg total) by mouth daily. 90 tablet 2  . bumetanide (BUMEX) 1 MG tablet TAKE ONE (1) TABLET EACH DAY 90 tablet 0  . lisinopril (PRINIVIL,ZESTRIL) 10 MG tablet Take 1 tablet (10 mg total) by mouth daily. 90 tablet 2  . potassium chloride (KLOR-CON 10) 10 MEQ tablet TAKE ONE (1) TABLET EACH DAY 90 tablet 2  . albuterol (PROVENTIL HFA;VENTOLIN HFA) 108 (90 BASE) MCG/ACT inhaler Inhale 2 puffs into the lungs every 6 (six) hours as needed for wheezing or shortness of breath. Reported on 09/06/2015    . bumetanide (BUMEX) 1 MG tablet TAKE 1 TABLET (1 MG TOTAL) BY MOUTH DAILY. 90 tablet 0  . apixaban (ELIQUIS)  tablet 5 mg     . atorvastatin (LIPITOR) tablet 20 mg     . calcium carbonate (OS-CAL - dosed in mg of elemental calcium) tablet 500 mg of  elemental calcium     . diltiazem (CARDIZEM) tablet 30 mg     . dofetilide (TIKOSYN) capsule 500 mcg     . lisinopril (PRINIVIL,ZESTRIL) tablet 10 mg     . loratadine (CLARITIN) tablet 10 mg      No facility-administered medications prior to visit.    Allergies  Allergen Reactions  . Gabapentin Swelling  . Lactose Intolerance (Gi) Other (See Comments)    Bothers her stomach  . Zebeta [Bisoprolol Fumarate] Nausea Only  . Penicillins Rash    Has patient had a PCN reaction causing immediate rash, facial/tongue/throat swelling, SOB or lightheadedness with hypotension: No Has patient had a PCN reaction causing severe rash involving mucus membranes or skin necrosis: No Has patient had a PCN reaction that required hospitalization Yes Has patient had a PCN reaction occurring within the last 10 years: Yes If all of the above answers are "NO", then may proceed with Cephalosporin use.  . Sulfa Antibiotics Rash    Review of Systems  Constitutional: Positive for malaise/fatigue. Negative for fever.  HENT: Negative for congestion.   Eyes: Negative for discharge.  Respiratory: Positive for cough and shortness of breath.   Cardiovascular: Negative for chest pain, palpitations and leg swelling.  Gastrointestinal: Negative for nausea and abdominal pain.  Genitourinary: Negative for dysuria.  Musculoskeletal: Positive for myalgias. Negative for falls.  Skin: Negative for rash.  Neurological: Negative for loss of consciousness and headaches.  Endo/Heme/Allergies: Negative for environmental allergies.  Psychiatric/Behavioral: Negative for depression. The patient is not nervous/anxious.        Objective:    Physical Exam  Constitutional: She is oriented to person, place, and time. She appears well-developed and well-nourished. No distress.  HENT:  Head:  Normocephalic and atraumatic.  Nose: Nose normal.  Eyes: Right eye exhibits no discharge. Left eye exhibits no discharge.  Neck: Normal range of motion. Neck supple.  Cardiovascular: Normal rate.   Murmur heard. Pulmonary/Chest: Effort normal and breath sounds normal.  Abdominal: Soft. Bowel sounds are normal. There is no tenderness.  Musculoskeletal: She exhibits no edema.  Neurological: She is alert and oriented to person, place, and time.  Skin: Skin is warm and dry.  Psychiatric: She has a normal mood and affect.  Nursing note and vitals reviewed.   BP 122/82 mmHg  Pulse 82  Temp(Src) 98 F (36.7 C) (Oral)  Ht 5\' 2"  (1.575 m)  Wt 261 lb (118.389 kg)  BMI 47.73 kg/m2  SpO2 96% Wt Readings from Last 3 Encounters:  09/06/15 261 lb (118.389 kg)  09/05/15 260 lb (117.935 kg)  08/16/15 260 lb 12.8 oz (118.298 kg)     Lab Results  Component Value Date   WBC 7.0 09/06/2015   HGB 14.1 09/06/2015   HCT 42.6 09/06/2015   PLT 216.0 09/06/2015   GLUCOSE 103* 09/06/2015   CHOL 169 09/06/2015   TRIG 129.0 09/06/2015   HDL 54.00 09/06/2015   LDLCALC 89 09/06/2015   ALT 10 09/06/2015   AST 16 09/06/2015   NA 139 09/06/2015   K 4.1 09/06/2015   CL 99 09/06/2015   CREATININE 0.99 09/06/2015   BUN 14 09/06/2015   CO2 32 09/06/2015   TSH 2.18 09/06/2015   INR 1.55* 08/06/2015    Lab Results  Component Value Date   TSH 2.18 09/06/2015   Lab Results  Component Value Date   WBC 7.0 09/06/2015   HGB 14.1 09/06/2015   HCT 42.6 09/06/2015   MCV 94.4 09/06/2015  PLT 216.0 09/06/2015   Lab Results  Component Value Date   NA 139 09/06/2015   K 4.1 09/06/2015   CO2 32 09/06/2015   GLUCOSE 103* 09/06/2015   BUN 14 09/06/2015   CREATININE 0.99 09/06/2015   BILITOT 0.8 09/06/2015   ALKPHOS 81 09/06/2015   AST 16 09/06/2015   ALT 10 09/06/2015   PROT 7.2 09/06/2015   ALBUMIN 4.5 09/06/2015   CALCIUM 10.1 09/06/2015   ANIONGAP 8 08/16/2015   GFR 57.45* 09/06/2015    Lab Results  Component Value Date   CHOL 169 09/06/2015   Lab Results  Component Value Date   HDL 54.00 09/06/2015   Lab Results  Component Value Date   LDLCALC 89 09/06/2015   Lab Results  Component Value Date   TRIG 129.0 09/06/2015   Lab Results  Component Value Date   CHOLHDL 3 09/06/2015   No results found for: HGBA1C     Assessment & Plan:   Problem List Items Addressed This Visit    Atrial fibrillation ([place])    Had a recent episode of RVR is doing well now on Tikosyn. Is following closely with cardiology      Relevant Medications   lisinopril (PRINIVIL,ZESTRIL) 10 MG tablet   bumetanide (BUMEX) 1 MG tablet   atorvastatin (LIPITOR) 20 MG tablet   apixaban (ELIQUIS) 5 MG TABS tablet   Other Relevant Orders   CBC (Completed)   TSH (Completed)   Comprehensive metabolic panel (Completed)   Lipid panel (Completed)   Magnesium (Completed)   Chronic diastolic HF (heart failure) (HCC)   Relevant Medications   lisinopril (PRINIVIL,ZESTRIL) 10 MG tablet   bumetanide (BUMEX) 1 MG tablet   atorvastatin (LIPITOR) 20 MG tablet   apixaban (ELIQUIS) 5 MG TABS tablet   Other Relevant Orders   CBC (Completed)   TSH (Completed)   Comprehensive metabolic panel (Completed)   Lipid panel (Completed)   Magnesium (Completed)   Essential hypertension - Primary    Well controlled, no changes to meds. Encouraged heart healthy diet such as the DASH diet and exercise as tolerated.       Relevant Medications   lisinopril (PRINIVIL,ZESTRIL) 10 MG tablet   bumetanide (BUMEX) 1 MG tablet   atorvastatin (LIPITOR) 20 MG tablet   apixaban (ELIQUIS) 5 MG TABS tablet   Other Relevant Orders   CBC (Completed)   TSH (Completed)   Comprehensive metabolic panel (Completed)   Lipid panel (Completed)   Magnesium (Completed)   Hyperlipidemia, mixed    Tolerating statin, encouraged heart healthy diet, avoid trans fats, minimize simple carbs and saturated fats. Increase exercise as  tolerated      Relevant Medications   lisinopril (PRINIVIL,ZESTRIL) 10 MG tablet   bumetanide (BUMEX) 1 MG tablet   atorvastatin (LIPITOR) 20 MG tablet   apixaban (ELIQUIS) 5 MG TABS tablet   Medicare annual wellness visit, subsequent    Patient denies any difficulties at home. No trouble with ADLs, depression or falls. See EMR for functional status screen and depression screen. No recent changes to vision or hearing. Is UTD with immunizations. Is UTD with screening. Discussed Advanced Directives. Encouraged heart healthy diet, exercise as tolerated and adequate sleep. See patient's problem list for health risk factors to monitor. See AVS for preventative healthcare recommendation schedule. Follows with Dr Elsworth Soho of pulmonology  Follows with Dr Rayann Heman of  [facility] and [facility] ordered       Obesity    Encouraged DASH diet, decrease po intake and  increase exercise as tolerated. Needs 7-8 hours of sleep nightly. Avoid trans fats, eat small, frequent meals every 4-5 hours with lean proteins, complex carbs and healthy fats. Minimize simple carbs, GMO foods.      Relevant Orders   CBC (Completed)   TSH (Completed)   Comprehensive metabolic panel (Completed)   Lipid panel (Completed)   Magnesium (Completed)   Obstructive sleep apnea    Using new Bipap machine      Relevant Orders   CBC (Completed)   TSH (Completed)   Comprehensive metabolic panel (Completed)   Lipid panel (Completed)   Magnesium (Completed)   Sleep apnea   Relevant Orders   CBC (Completed)   TSH (Completed)   Comprehensive metabolic panel (Completed)   Lipid panel (Completed)   Magnesium (Completed)    Other Visit Diagnoses    Atrial fibrillation, unspecified        Relevant Medications    lisinopril (PRINIVIL,ZESTRIL) 10 MG tablet    bumetanide (BUMEX) 1 MG tablet    atorvastatin (LIPITOR) 20 MG tablet    apixaban (ELIQUIS) 5 MG TABS tablet    Other Relevant Orders    CBC (Completed)    TSH (Completed)     Comprehensive metabolic panel (Completed)    Lipid panel (Completed)    Magnesium (Completed)    Hyperlipidemia        Relevant Medications    lisinopril (PRINIVIL,ZESTRIL) 10 MG tablet    bumetanide (BUMEX) 1 MG tablet    atorvastatin (LIPITOR) 20 MG tablet    apixaban (ELIQUIS) 5 MG TABS tablet    Other Relevant Orders    CBC (Completed)    TSH (Completed)    Comprehensive metabolic panel (Completed)    Lipid panel (Completed)    Magnesium (Completed)    Postmenopausal estrogen deficiency        Relevant Orders    DG Bone Density (Completed)    CBC (Completed)    TSH (Completed)    Comprehensive metabolic panel (Completed)    Lipid panel (Completed)    Magnesium (Completed)    Need for viral immunization        Relevant Medications    zoster vaccine live, PF, (ZOSTAVAX) 09811 UNT/0.65ML injection    Other Relevant Orders    CBC (Completed)    TSH (Completed)    Comprehensive metabolic panel (Completed)    Lipid panel (Completed)    Magnesium (Completed)    Need for 23-polyvalent pneumococcal polysaccharide vaccine        Relevant Orders    Pneumococcal polysaccharide vaccine 23-valent greater than or equal to 2yo subcutaneous/IM (Completed)       I have discontinued Ms. Pippenger's albuterol. I am also having her start on zoster vaccine live (PF). Additionally, I am having her maintain her calcium carbonate, loratadine, PROBIOTIC ACIDOPHILUS, acetaminophen, psyllium, dofetilide, diltiazem, potassium chloride, lisinopril, bumetanide, atorvastatin, and apixaban.  Meds ordered this encounter  Medications  . potassium chloride (KLOR-CON 10) 10 MEQ tablet    Sig: TAKE ONE (1) TABLET EACH DAY    Dispense:  90 tablet    Refill:  2    D/C PREVIOUS SCRIPTS FOR THIS MEDICATION  . lisinopril (PRINIVIL,ZESTRIL) 10 MG tablet    Sig: Take 1 tablet (10 mg total) by mouth daily.    Dispense:  90 tablet    Refill:  2    D/C PREVIOUS SCRIPTS FOR THIS MEDICATION  . bumetanide (BUMEX)  1 MG tablet    Sig: TAKE ONE (1)  TABLET EACH DAY    Dispense:  90 tablet    Refill:  0  . atorvastatin (LIPITOR) 20 MG tablet    Sig: Take 1 tablet (20 mg total) by mouth daily.    Dispense:  90 tablet    Refill:  2    D/C PREVIOUS SCRIPTS FOR THIS MEDICATION  . apixaban (ELIQUIS) 5 MG TABS tablet    Sig: Take 1 tablet (5 mg total) by mouth 2 (two) times daily.    Dispense:  180 tablet    Refill:  2  . zoster vaccine live, PF, (ZOSTAVAX) 57846 UNT/0.65ML injection    Sig: Inject 19,400 Units into the skin once.    Dispense:  1 each    Refill:  0     [name], MD

## 2015-09-15 NOTE — Assessment & Plan Note (Signed)
Had a recent episode of RVR is doing well now on Tikosyn. Is following closely with cardiology

## 2015-09-15 NOTE — Assessment & Plan Note (Signed)
Patient denies any difficulties at home. No trouble with ADLs, depression or falls. See EMR for functional status screen and depression screen. No recent changes to vision or hearing. Is UTD with immunizations. Is UTD with screening. Discussed Advanced Directives. Encouraged heart healthy diet, exercise as tolerated and adequate sleep. See patient's problem list for health risk factors to monitor. See AVS for preventative healthcare recommendation schedule. Follows with Dr Elsworth Soho of pulmonology  Follows with Dr Rayann Heman of  [facility] and [facility] ordered

## 2015-09-15 NOTE — Assessment & Plan Note (Signed)
Tolerating statin, encouraged heart healthy diet, avoid trans fats, minimize simple carbs and saturated fats. Increase exercise as tolerated 

## 2015-09-23 ENCOUNTER — Other Ambulatory Visit (HOSPITAL_COMMUNITY): Payer: Self-pay | Admitting: *Deleted

## 2015-09-23 MED ORDER — DOFETILIDE 250 MCG PO CAPS: 250.0000 ug | ORAL_CAPSULE | Freq: Two times a day (BID) | ORAL | Status: DC

## 2015-10-16 ENCOUNTER — Telehealth (HOSPITAL_COMMUNITY): Payer: Self-pay | Admitting: *Deleted

## 2015-10-16 MED ORDER — DILTIAZEM HCL ER COATED BEADS 120 MG PO CP24: 120.0000 mg | ORAL_CAPSULE | Freq: Every day | ORAL | Status: DC

## 2015-10-16 MED ORDER — DILTIAZEM HCL ER COATED BEADS 240 MG PO CP24: 240.0000 mg | ORAL_CAPSULE | Freq: Every day | ORAL | Status: DC

## 2015-10-16 NOTE — Telephone Encounter (Addendum)
Pt called in wanting a conversion of her cardizem 30mg  that she takes 4 times per day to a daily regimen. Discussed with [name] NP will stop cardizem 30mg  QID and start Cardizem CD 120mg  once a day. RX called in to pharmacy.

## 2015-11-20 ENCOUNTER — Ambulatory Visit (INDEPENDENT_AMBULATORY_CARE_PROVIDER_SITE_OTHER): Payer: Medicare Other | Admitting: Family Medicine

## 2015-11-20 ENCOUNTER — Encounter: Payer: Self-pay | Admitting: Family Medicine

## 2015-11-20 VITALS — BP 140/73 | HR 79 | Ht 62.0 in | Wt 262.0 lb

## 2015-11-20 DIAGNOSIS — R319 Hematuria, unspecified: Secondary | ICD-10-CM

## 2015-11-20 DIAGNOSIS — R82998 Other abnormal findings in urine: Secondary | ICD-10-CM

## 2015-11-20 DIAGNOSIS — R8299 Other abnormal findings in urine: Secondary | ICD-10-CM | POA: Diagnosis not present

## 2015-11-20 LAB — POCT URINALYSIS DIPSTICK
BILIRUBIN UA: NEGATIVE
Glucose, UA: NEGATIVE
KETONES UA: NEGATIVE
Nitrite, UA: NEGATIVE
Protein, UA: NEGATIVE
Spec Grav, UA: 1.015
Urobilinogen, UA: NEGATIVE
pH, UA: 6

## 2015-11-20 MED ORDER — CEPHALEXIN 500 MG PO CAPS: 500.0000 mg | ORAL_CAPSULE | Freq: Four times a day (QID) | ORAL | Status: DC

## 2015-11-20 MED ORDER — CEPHALEXIN 500 MG PO CAPS: 500.0000 mg | ORAL_CAPSULE | Freq: Two times a day (BID) | ORAL | Status: DC

## 2015-11-20 NOTE — Progress Notes (Addendum)
[place] at [facility] [address], [place], [place] [address] [phone]  Date:  11/20/2015   Name:  Tracy Miles   DOB:  [birth year]   MRN:  [id number]  PCP:  [name], MD    Chief Complaint: Urinary Tract Infection   History of Present Illness:  Tracy Miles is a 82 y.o. very pleasant female patient who presents with the following:  She has a history of UTI and thinks she may have one again. She first noted sx on Monday- today is Wednesday.  Monday am she noted some blood in her urine . Tuesday she took clindamycin prior to a dental cleaning. This helped but the sx were back again today with trace blood in her urine, urgency and dysuria.    No vomiting or fever.  She does not feel great but is not flu like.   She has noted a mild back ache but this is typical of her spinal stenosis   She has used keflex in the past without any diffiuclty for a UTI- last used in July of 2015. She does have a penicillin allergy but has used the keflex since she developed this allergy.   Penicillin allergy developed about 20 years ago and consisted of a rash  Patient Active Problem List   Diagnosis Date Noted  . Medicare annual wellness visit, subsequent 09/15/2015  . Visit for monitoring Tikosyn therapy 08/11/2015  . Chronic diastolic HF (heart failure) ([place]) 08/11/2015  . Sleep apnea   . SOBOE (shortness of breath on exertion) 08/05/2015  . Atrial fibrillation with RVR ([place]) 08/05/2015  . Obstructive sleep apnea 02/22/2015  . Spinal stenosis of lumbar region 04/02/2014  . Urinary tract infection with hematuria 03/26/2014  . Personal history of colonic polyps 02/25/2014  . Decreased visual acuity 02/25/2014  . Benign paroxysmal positional vertigo 12/17/2013  . Abnormal results of thyroid function studies 12/17/2013  . History of DVT (deep vein thrombosis) 11/19/2013  . Allergic state 11/19/2013  . OA (osteoarthritis)  11/19/2013  . Constipation 11/16/2013  . Heme positive stool 11/16/2013  . DDD (degenerative disc disease) 11/16/2013  . Obesity   . Iron deficiency anemia 11/13/2013  . Fatigue 11/13/2013  . Hyperlipidemia, mixed 10/19/2013  . Atrial fibrillation ([place]) 10/04/2013  . Essential hypertension 10/04/2013    Past Medical History  Diagnosis Date  . Hypertension   . Heart murmur   . Coronary artery disease   . Atrial fibrillation ([place])   . Anemia   . Arthritis   . Colon polyp   . Personal history of DVT (deep vein thrombosis) X 2    "left"  . Hyperlipidemia   . Pancreatitis     post hysterectomy  . Sleep apnea   . Aortic stenosis     Very mild  . Hypercholesteremia   . OSA on CPAP   . PAF (paroxysmal atrial fibrillation) ([place])   . History of valvular heart disease     LEFT  . Chicken pox as a child  . Measles as a child  . Mumps child and teenager  . Allergy   . Obesity   . Unspecified constipation 11/16/2013  . Heme positive stool 11/16/2013  . DDD (degenerative disc disease) 11/16/2013  . Unspecified sleep apnea 11/19/2013  . Allergic state 11/19/2013  . OA (osteoarthritis) 11/19/2013    S/p L TKR  . Benign paroxysmal positional vertigo 12/17/2013  . Abnormal results of thyroid function studies 12/17/2013  .  Iron deficiency anemia 11/13/2013  . Personal history of colonic polyps 02/25/2014  . Spinal stenosis   . Obstructive sleep apnea 02/22/2015  . Visit for monitoring Tikosyn therapy 08/11/2015  . Medicare annual wellness visit, subsequent 09/15/2015    Past Surgical History  Procedure Laterality Date  . Tonsilectomy, adenoidectomy, bilateral myringotomy and tubes  child  . Lumbar laminectomy  40 yrs ago    "L3-4"  . Tubal ligation  82 years old  . Total abdominal hysterectomy  1995    had 2 tumors- benign  . Replacement total knee Left 2013  . Cyst removal hand Bilateral 1990's    "played to much golf"  . Meniscus repair Bilateral 2000 and 2010  . Appendectomy     . Tonsillectomy and adenoidectomy    . Joint replacement    . Back surgery    . Cataract extraction w/ intraocular lens  implant, bilateral Bilateral 2016  . Dilation and curettage of uterus    . Cardioversion N/A 08/09/2015    Procedure: CARDIOVERSION;  Surgeon: Jerline Pain, MD;  Location: [place];  Service: Cardiovascular;  Laterality: N/A;  . Eye surgery  2016    cataracts    Social History  Substance Use Topics  . Smoking status: Never Smoker   . Smokeless tobacco: Never Used     Comment: never used tobacco  . Alcohol Use: No    Family History  Problem Relation Age of Onset  . Heart attack Mother 87  . Hyperlipidemia Mother     ?  Marland Kitchen Dementia Mother   . Pernicious anemia Mother   . COPD Father     smoker  . Cancer Father 55    prostate  . Heart disease Brother     quadruple bipass surgery  . Hyperlipidemia Brother   . Hypertension Brother   . Diabetes Maternal Grandmother     type 2  . Pernicious anemia Maternal Grandmother   . Colon cancer Neg Hx   . Pancreatic cancer Neg Hx   . Stomach cancer Neg Hx   . Throat cancer Neg Hx   . Liver disease Neg Hx   . Gout Son   . Atrial fibrillation Son   . Hyperlipidemia Son   . Cancer Maternal Grandfather     liver  . Cancer Paternal Grandmother     lung- doesn't think she smokes?  . Stroke Paternal Grandfather   . Cancer Son 50    non hodgin's lymphoma  . Gout Son   . Hyperlipidemia Son   . Sleep apnea Son     Allergies  Allergen Reactions  . Gabapentin Swelling  . Lactose Intolerance (Gi) Other (See Comments)    Bothers her stomach  . Zebeta [Bisoprolol Fumarate] Nausea Only  . Penicillins Rash    Has patient had a PCN reaction causing immediate rash, facial/tongue/throat swelling, SOB or lightheadedness with hypotension: Yes Has patient had a PCN reaction causing severe rash involving mucus membranes or skin necrosis: Yes Has patient had a PCN reaction that required hospitalization Yes Has patient  had a PCN reaction occurring within the last 10 years: Yes If all of the above answers are "NO", then may proceed with Cephalosporin use.  . Sulfa Antibiotics Rash    Medication list has been reviewed and updated.  Current Outpatient Prescriptions on File Prior to Visit  Medication Sig Dispense Refill  . acetaminophen (TYLENOL) 650 MG CR tablet Take 1,300 mg by mouth every evening.    Marland Kitchen  apixaban (ELIQUIS) 5 MG TABS tablet Take 1 tablet (5 mg total) by mouth 2 (two) times daily. 180 tablet 2  . atorvastatin (LIPITOR) 20 MG tablet Take 1 tablet (20 mg total) by mouth daily. 90 tablet 2  . bumetanide (BUMEX) 1 MG tablet TAKE ONE (1) TABLET EACH DAY 90 tablet 0  . calcium carbonate (OS-CAL) 600 MG TABS tablet Take 600 mg by mouth 2 (two) times daily with a meal.    . dofetilide (TIKOSYN) 250 MCG capsule Take 1 capsule (250 mcg total) by mouth 2 (two) times daily. 60 capsule 3  . Lactobacillus (PROBIOTIC ACIDOPHILUS) TABS Take 1 tablet by mouth daily.    Marland Kitchen lisinopril (PRINIVIL,ZESTRIL) 10 MG tablet Take 1 tablet (10 mg total) by mouth daily. 90 tablet 2  . loratadine (CLARITIN) 10 MG tablet Take 10 mg by mouth daily.     . potassium chloride (KLOR-CON 10) 10 MEQ tablet TAKE ONE (1) TABLET EACH DAY 90 tablet 2  . psyllium (HYDROCIL/METAMUCIL) 95 % PACK Take 1 packet by mouth 2 (two) times daily. 56 each 3  . zoster vaccine live, PF, (ZOSTAVAX) 28413 UNT/0.65ML injection Inject 19,400 Units into the skin once. 1 each 0   No current facility-administered medications on file prior to visit.    Review of Systems:  As per HPI- otherwise negative.   Physical Examination: Filed Vitals:   11/20/15 1444  BP: 140/73  Pulse: 79   Filed Vitals:   11/20/15 1444  Height: 5\' 2"  (1.575 m)  Weight: 262 lb (118.842 kg)   Body mass index is 47.91 kg/(m^2). Ideal Body Weight: Weight in (lb) to have BMI = 25: 136.4  GEN: WDWN, NAD, Non-toxic, A & O x 3, obese, looks well HEENT: Atraumatic,  Normocephalic. Neck supple. No masses, No LAD. Ears and Nose: No external deformity. CV: RRR, No M/G/R. No JVD. No thrill. No extra heart sounds. PULM: CTA B, no wheezes, crackles, rhonchi. No retractions. No resp. distress. No accessory muscle use. ABD: S, NT, ND, +BS. No rebound. No HSM.  Belly is benign, no CVA tenderness  EXTR: No c/c/e NEURO Normal gait.  PSYCH: Normally interactive. Conversant. Not depressed or anxious appearing.  Calm demeanor.   Results for orders placed or performed in visit on 11/20/15  POCT urinalysis dipstick  Result Value Ref Range   Color, UA Yellow    Clarity, UA clear    Glucose, UA negative    Bilirubin, UA negative    Ketones, UA negative    Spec Grav, UA 1.015    Blood, UA 2+    pH, UA 6.0    Protein, UA negative    Urobilinogen, UA negative    Nitrite, UA negative    Leukocytes, UA Trace (A) Negative    Assessment and Plan: Hematuria - Plan: POCT urinalysis dipstick, cephALEXin (KEFLEX) 500 MG capsule  Here today with a UTI with mild hematuria. Treatment options are limited as she is allergic to pcn and sulfa, has a borderline/ long QTc of 460 and is on eliquis and tikosyn making cipro a poor choice.  Cannot use macrobid due to creat clearance of 45.  Only options are fosfomycin or a cephalosporin. Discussed with pt- she prefers to use keflex which is inexpensive and which she has tolerated well in the recent past Cautioned her of the possibility of a cross reaction with her penicillin allergy- if any concerns stop mediation and seek care At this point she has just a trace of visible  blood in her urine- if this gets worse she will let me know as we may need to hold eliquis  Signed Lamar Blinks, MD  Her urine culture is borderline- grew a low count of citrobacter. Called and LMOM on 11/25/15- call me if any symptoms remain.    Meds ordered this encounter  Medications  . diltiazem (CARDIZEM CD) 240 MG 24 hr capsule    Sig: Take 240 mg by  mouth daily.  Marland Kitchen DISCONTD: cephALEXin (KEFLEX) 500 MG capsule    Sig: Take 1 capsule (500 mg total) by mouth 4 (four) times daily.    Dispense:  14 capsule    Refill:  0  . cephALEXin (KEFLEX) 500 MG capsule    Sig: Take 1 capsule (500 mg total) by mouth 2 (two) times daily.    Dispense:  14 capsule    Refill:  0      cephALEXin (KEFLEX) 500 MG capsule (Discontinued)   03/26/2014 04/02/2014      Sig - Route: Take 500 mg by mouth 2 (two) times daily. - Oral     Class: Historical Med     Reason for Discontinue: Therapy completed

## 2015-11-20 NOTE — Addendum Note (Signed)
Addended by: Caffie Pinto on: 11/20/2015 04:56 PM   Modules accepted: Orders

## 2015-11-20 NOTE — Patient Instructions (Signed)
We are going to treat you for a UTI with keflex- take it twice a day for a week If you are not getting better let me know  If any sign of an allergic reaction to the medication stop use!

## 2015-11-22 LAB — URINE CULTURE

## 2015-11-25 ENCOUNTER — Encounter: Payer: Self-pay | Admitting: Family Medicine

## 2015-12-04 ENCOUNTER — Ambulatory Visit (HOSPITAL_COMMUNITY)
Admission: RE | Admit: 2015-12-04 | Discharge: 2015-12-04 | Disposition: A | Discharge status: Home / Self Care | Payer: Medicare Other | Source: Ambulatory Visit | Attending: Nurse Practitioner | Admitting: Nurse Practitioner

## 2015-12-04 ENCOUNTER — Encounter (HOSPITAL_COMMUNITY): Payer: Self-pay | Admitting: Nurse Practitioner

## 2015-12-04 VITALS — BP 142/80 | HR 76 | Ht 62.0 in | Wt 263.0 lb

## 2015-12-04 DIAGNOSIS — Z79899 Other long term (current) drug therapy: Secondary | ICD-10-CM | POA: Insufficient documentation

## 2015-12-04 DIAGNOSIS — Z96652 Presence of left artificial knee joint: Secondary | ICD-10-CM | POA: Diagnosis not present

## 2015-12-04 DIAGNOSIS — I4891 Unspecified atrial fibrillation: Secondary | ICD-10-CM | POA: Diagnosis not present

## 2015-12-04 DIAGNOSIS — Z6841 Body Mass Index (BMI) 40.0 and over, adult: Secondary | ICD-10-CM | POA: Insufficient documentation

## 2015-12-04 DIAGNOSIS — E7800 Pure hypercholesterolemia, unspecified: Secondary | ICD-10-CM | POA: Diagnosis not present

## 2015-12-04 DIAGNOSIS — I10 Essential (primary) hypertension: Secondary | ICD-10-CM | POA: Diagnosis not present

## 2015-12-04 DIAGNOSIS — E669 Obesity, unspecified: Secondary | ICD-10-CM | POA: Insufficient documentation

## 2015-12-04 DIAGNOSIS — G4733 Obstructive sleep apnea (adult) (pediatric): Secondary | ICD-10-CM | POA: Diagnosis not present

## 2015-12-04 DIAGNOSIS — I481 Persistent atrial fibrillation: Secondary | ICD-10-CM

## 2015-12-04 DIAGNOSIS — Z880 Allergy status to penicillin: Secondary | ICD-10-CM | POA: Diagnosis not present

## 2015-12-04 DIAGNOSIS — Z86718 Personal history of other venous thrombosis and embolism: Secondary | ICD-10-CM | POA: Insufficient documentation

## 2015-12-04 DIAGNOSIS — Z8249 Family history of ischemic heart disease and other diseases of the circulatory system: Secondary | ICD-10-CM | POA: Diagnosis not present

## 2015-12-04 DIAGNOSIS — Z888 Allergy status to other drugs, medicaments and biological substances status: Secondary | ICD-10-CM | POA: Diagnosis not present

## 2015-12-04 DIAGNOSIS — Z7901 Long term (current) use of anticoagulants: Secondary | ICD-10-CM | POA: Diagnosis not present

## 2015-12-04 DIAGNOSIS — I2510 Atherosclerotic heart disease of native coronary artery without angina pectoris: Secondary | ICD-10-CM | POA: Insufficient documentation

## 2015-12-04 DIAGNOSIS — I4819 Other persistent atrial fibrillation: Secondary | ICD-10-CM

## 2015-12-04 NOTE — Progress Notes (Signed)
Patient ID: Tracy Miles, female   DOB: 03-20-1934, 82 y.o.   MRN: [id number]     Primary Care Physician: [name], MD Referring Physician: Dr. Lillette Boxer [place] is a 82 y.o. female with a h/o persistent afib for f/u tikosyn use. Pt is doing well on tikosyn and no reported afib. She continues on apixaban without bleeding issues.She has been having some knee issues and taking ibuprofen. I have asked her to diminish the use of NSAIDs for fear of bleeding issues and use tylenol or a topical cream for joint pain.  Today, she denies symptoms of palpitations, chest pain, shortness of breath, orthopnea, PND, lower extremity edema, dizziness, presyncope, syncope, or neurologic sequela. The patient is tolerating medications without difficulties and is otherwise without complaint today.   Past Medical History  Diagnosis Date  . Hypertension   . Heart murmur   . Coronary artery disease   . Atrial fibrillation ([place])   . Anemia   . Arthritis   . Colon polyp   . Personal history of DVT (deep vein thrombosis) X 2    "left"  . Hyperlipidemia   . Pancreatitis     post hysterectomy  . Sleep apnea   . Aortic stenosis     Very mild  . Hypercholesteremia   . OSA on CPAP   . PAF (paroxysmal atrial fibrillation) ([place])   . History of valvular heart disease     LEFT  . Chicken pox as a child  . Measles as a child  . Mumps child and teenager  . Allergy   . Obesity   . Unspecified constipation 11/16/2013  . Heme positive stool 11/16/2013  . DDD (degenerative disc disease) 11/16/2013  . Unspecified sleep apnea 11/19/2013  . Allergic state 11/19/2013  . OA (osteoarthritis) 11/19/2013    S/p L TKR  . Benign paroxysmal positional vertigo 12/17/2013  . Abnormal results of thyroid function studies 12/17/2013  . Iron deficiency anemia 11/13/2013  . Personal history of colonic polyps 02/25/2014  . Spinal stenosis   . Obstructive sleep apnea 02/22/2015  . Visit for monitoring Tikosyn therapy  08/11/2015  . Medicare annual wellness visit, subsequent 09/15/2015   Past Surgical History  Procedure Laterality Date  . Tonsilectomy, adenoidectomy, bilateral myringotomy and tubes  child  . Lumbar laminectomy  40 yrs ago    "L3-4"  . Tubal ligation  82 years old  . Total abdominal hysterectomy  1995    had 2 tumors- benign  . Replacement total knee Left 2013  . Cyst removal hand Bilateral 1990's    "played to much golf"  . Meniscus repair Bilateral 2000 and 2010  . Appendectomy    . Tonsillectomy and adenoidectomy    . Joint replacement    . Back surgery    . Cataract extraction w/ intraocular lens  implant, bilateral Bilateral 2016  . Dilation and curettage of uterus    . Cardioversion N/A 08/09/2015    Procedure: CARDIOVERSION;  Surgeon: Jerline Pain, MD;  Location: [place];  Service: Cardiovascular;  Laterality: N/A;  . Eye surgery  2016    cataracts    Current Outpatient Prescriptions  Medication Sig Dispense Refill  . acetaminophen (TYLENOL) 650 MG CR tablet Take 1,300 mg by mouth every evening.    Marland Kitchen apixaban (ELIQUIS) 5 MG TABS tablet Take 1 tablet (5 mg total) by mouth 2 (two) times daily. 180 tablet 2  . atorvastatin (LIPITOR) 20 MG tablet Take 1 tablet (20 mg  total) by mouth daily. 90 tablet 2  . bumetanide (BUMEX) 1 MG tablet TAKE ONE (1) TABLET EACH DAY 90 tablet 0  . calcium carbonate (OS-CAL) 600 MG TABS tablet Take 600 mg by mouth 2 (two) times daily with a meal.    . diltiazem (CARDIZEM CD) 240 MG 24 hr capsule Take 240 mg by mouth daily.    Marland Kitchen dofetilide (TIKOSYN) 250 MCG capsule Take 1 capsule (250 mcg total) by mouth 2 (two) times daily. 60 capsule 3  . ibuprofen (ADVIL,MOTRIN) 200 MG tablet Take 200 mg by mouth daily.    . Lactobacillus (PROBIOTIC ACIDOPHILUS) TABS Take 1 tablet by mouth daily.    Marland Kitchen lisinopril (PRINIVIL,ZESTRIL) 10 MG tablet Take 1 tablet (10 mg total) by mouth daily. 90 tablet 2  . loratadine (CLARITIN) 10 MG tablet Take 10 mg by mouth  daily.     . potassium chloride (KLOR-CON 10) 10 MEQ tablet TAKE ONE (1) TABLET EACH DAY 90 tablet 2  . psyllium (HYDROCIL/METAMUCIL) 95 % PACK Take 1 packet by mouth 2 (two) times daily. 56 each 3  . zoster vaccine live, PF, (ZOSTAVAX) 60454 UNT/0.65ML injection Inject 19,400 Units into the skin once. 1 each 0   No current facility-administered medications for this encounter.    Allergies  Allergen Reactions  . Gabapentin Swelling  . Lactose Intolerance (Gi) Other (See Comments)    Bothers her stomach  . Zebeta [Bisoprolol Fumarate] Nausea Only  . Penicillins Rash    Social History   Social History  . Marital Status: Widowed    Spouse Name: N/A  . Number of Children: 3  . Years of Education: N/A   Occupational History  . retired Pharmacist, hospital    Social History Main Topics  . Smoking status: Never Smoker   . Smokeless tobacco: Never Used     Comment: never used tobacco  . Alcohol Use: No  . Drug Use: No  . Sexual Activity: No     Comment: lives at [place], low sodium diet   Other Topics Concern  . Not on file   Social History Narrative    Family History  Problem Relation Age of Onset  . Heart attack Mother 54  . Hyperlipidemia Mother     ?  Marland Kitchen Dementia Mother   . Pernicious anemia Mother   . COPD Father     smoker  . Cancer Father 4    prostate  . Heart disease Brother     quadruple bipass surgery  . Hyperlipidemia Brother   . Hypertension Brother   . Diabetes Maternal Grandmother     type 2  . Pernicious anemia Maternal Grandmother   . Colon cancer Neg Hx   . Pancreatic cancer Neg Hx   . Stomach cancer Neg Hx   . Throat cancer Neg Hx   . Liver disease Neg Hx   . Gout Son   . Atrial fibrillation Son   . Hyperlipidemia Son   . Cancer Maternal Grandfather     liver  . Cancer Paternal Grandmother     lung- doesn't think she smokes?  . Stroke Paternal Grandfather   . Cancer Son 50    non hodgin's lymphoma  . Gout Son   . Hyperlipidemia Son   .  Sleep apnea Son     ROS- All systems are reviewed and negative except as per the HPI above  Physical Exam: Filed Vitals:   12/04/15 1402  BP: 142/80  Pulse: 76  Height:  5\' 2"  (1.575 m)  Weight: 263 lb (119.296 kg)    GEN- The patient is well appearing, alert and oriented x 3 today.   Head- normocephalic, atraumatic Eyes-  Sclera clear, conjunctiva pink Ears- hearing intact Oropharynx- clear Neck- supple, no JVP Lymph- no cervical lymphadenopathy Lungs- Clear to ausculation bilaterally, normal work of breathing Heart- Regular rate and rhythm, no murmurs, rubs or gallops, PMI not laterally displaced GI- soft, NT, ND, + BS Extremities- no clubbing, cyanosis, or edema MS- no significant deformity or atrophy Skin- no rash or lesion Psych- euthymic mood, full affect Neuro- strength and sensation are intact  EKG- NSR at 76 bpm, pr int 176 ms, qrs int 78 ms, qtc 454 ms Epic records reviewed  Assessment and Plan: 1. afib  Staying in SR on tikosyn Continue apixaban 5 mg bid Bmet/mag today Avoid nsaids  F/u afib clinic in 3 months, sooner if needed  Butch Penny C. Cerina Leary, [facility] [address] [place], [place] [address] [phone]

## 2015-12-05 LAB — BASIC METABOLIC PANEL
ANION GAP: 13 (ref 5–15)
BUN: 17 mg/dL (ref 6–20)
CALCIUM: 9.4 mg/dL (ref 8.9–10.3)
CO2: 27 mmol/L (ref 22–32)
Chloride: 102 mmol/L (ref 101–111)
Creatinine, Ser: 1.05 mg/dL — ABNORMAL HIGH (ref 0.44–1.00)
GFR calc Af Amer: 57 mL/min — ABNORMAL LOW (ref >60)
GFR calc non Af Amer: 49 mL/min — ABNORMAL LOW (ref >60)
GLUCOSE: 99 mg/dL (ref 65–99)
Potassium: 3.9 mmol/L (ref 3.5–5.1)
Sodium: 142 mmol/L (ref 135–145)

## 2015-12-18 ENCOUNTER — Other Ambulatory Visit (HOSPITAL_COMMUNITY): Payer: Self-pay | Admitting: *Deleted

## 2015-12-18 DIAGNOSIS — I4819 Other persistent atrial fibrillation: Secondary | ICD-10-CM

## 2015-12-20 ENCOUNTER — Ambulatory Visit (INDEPENDENT_AMBULATORY_CARE_PROVIDER_SITE_OTHER): Payer: Medicare Other | Admitting: Family Medicine

## 2015-12-20 ENCOUNTER — Encounter: Payer: Self-pay | Admitting: Family Medicine

## 2015-12-20 VITALS — BP 124/78 | HR 78 | Temp 98.3°F | Ht 63.5 in | Wt 261.1 lb

## 2015-12-20 DIAGNOSIS — I10 Essential (primary) hypertension: Secondary | ICD-10-CM | POA: Diagnosis not present

## 2015-12-20 DIAGNOSIS — I4891 Unspecified atrial fibrillation: Secondary | ICD-10-CM | POA: Diagnosis not present

## 2015-12-20 DIAGNOSIS — D649 Anemia, unspecified: Secondary | ICD-10-CM

## 2015-12-20 DIAGNOSIS — Z8679 Personal history of other diseases of the circulatory system: Secondary | ICD-10-CM

## 2015-12-20 DIAGNOSIS — E782 Mixed hyperlipidemia: Secondary | ICD-10-CM

## 2015-12-20 DIAGNOSIS — K5909 Other constipation: Secondary | ICD-10-CM

## 2015-12-20 DIAGNOSIS — E669 Obesity, unspecified: Secondary | ICD-10-CM

## 2015-12-20 LAB — CBC
HCT: 40.3 % (ref 35.0–45.0)
Hemoglobin: 13.6 g/dL (ref 11.7–15.5)
MCH: 31.3 pg (ref 27.0–33.0)
MCHC: 33.7 g/dL (ref 32.0–36.0)
MCV: 92.6 fL (ref 80.0–100.0)
MPV: 9.4 fL (ref 7.5–12.5)
PLATELETS: 245 10*3/uL (ref 140–400)
RBC: 4.35 MIL/uL (ref 3.80–5.10)
RDW: 13.3 % (ref 11.0–15.0)
WBC: 6 10*3/uL (ref 3.8–10.8)

## 2015-12-20 LAB — COMPREHENSIVE METABOLIC PANEL
ALBUMIN: 4.1 g/dL (ref 3.6–5.1)
ALT: 13 U/L (ref 6–29)
AST: 16 U/L (ref 10–35)
Alkaline Phosphatase: 87 U/L (ref 33–130)
BILIRUBIN TOTAL: 0.5 mg/dL (ref 0.2–1.2)
BUN: 17 mg/dL (ref 7–25)
CHLORIDE: 102 mmol/L (ref 98–110)
CO2: 28 mmol/L (ref 20–31)
CREATININE: 0.94 mg/dL — AB (ref 0.60–0.93)
Calcium: 9.4 mg/dL (ref 8.6–10.4)
GLUCOSE: 97 mg/dL (ref 65–99)
Potassium: 4 mmol/L (ref 3.5–5.3)
SODIUM: 142 mmol/L (ref 135–146)
Total Protein: 6.6 g/dL (ref 6.1–8.1)

## 2015-12-20 NOTE — Progress Notes (Signed)
Subjective:    Patient ID: Tracy Miles, female    DOB: 13-Feb-1934, 82 y.o.   MRN: [id number]  Chief Complaint  Patient presents with  . Follow-up    3 month    HPI  Patient is in today for follow up. Patient has been taking ibuprofen daily for joint pain but reports she has been feeling well.  No other illness or acute concerns. Denies CP/palp/SOB/HA/congestion/fevers/GI or GU c/o. Taking meds as prescribed  Past Medical History  Diagnosis Date  . Hypertension   . Heart murmur   . Coronary artery disease   . Atrial fibrillation ([place])   . Anemia   . Arthritis   . Colon polyp   . Personal history of DVT (deep vein thrombosis) X 2    "left"  . Hyperlipidemia   . Pancreatitis     post hysterectomy  . Sleep apnea   . Aortic stenosis     Very mild  . Hypercholesteremia   . OSA on CPAP   . PAF (paroxysmal atrial fibrillation) ([place])   . History of valvular heart disease     LEFT  . Chicken pox as a child  . Measles as a child  . Mumps child and teenager  . Allergy   . Obesity   . Unspecified constipation 11/16/2013  . Heme positive stool 11/16/2013  . DDD (degenerative disc disease) 11/16/2013  . Unspecified sleep apnea 11/19/2013  . Allergic state 11/19/2013  . OA (osteoarthritis) 11/19/2013    S/p L TKR  . Benign paroxysmal positional vertigo 12/17/2013  . Abnormal results of thyroid function studies 12/17/2013  . Iron deficiency anemia 11/13/2013  . Personal history of colonic polyps 02/25/2014  . Spinal stenosis   . Obstructive sleep apnea 02/22/2015  . Visit for monitoring Tikosyn therapy 08/11/2015  . Medicare annual wellness visit, subsequent 09/15/2015    Past Surgical History  Procedure Laterality Date  . Tonsilectomy, adenoidectomy, bilateral myringotomy and tubes  child  . Lumbar laminectomy  42 yrs ago    "L3-4"  . Tubal ligation  82 years old  . Total abdominal hysterectomy  1995    had 2 tumors- benign  . Replacement total knee Left 2013  . Cyst  removal hand Bilateral 1990's    "played to much golf"  . Meniscus repair Bilateral 2000 and 2010  . Appendectomy    . Tonsillectomy and adenoidectomy    . Joint replacement    . Back surgery    . Cataract extraction w/ intraocular lens  implant, bilateral Bilateral 2016  . Dilation and curettage of uterus    . Cardioversion N/A 08/09/2015    Procedure: CARDIOVERSION;  Surgeon: Jerline Pain, MD;  Location: [facility];  Service: Cardiovascular;  Laterality: N/A;  . Eye surgery  2016    cataracts    Family History  Problem Relation Age of Onset  . Heart attack Mother 10  . Hyperlipidemia Mother     ?  Marland Kitchen Dementia Mother   . Pernicious anemia Mother   . COPD Father     smoker  . Cancer Father 37    prostate  . Heart disease Brother     quadruple bipass surgery  . Hyperlipidemia Brother   . Hypertension Brother   . Diabetes Maternal Grandmother     type 2  . Pernicious anemia Maternal Grandmother   . Colon cancer Neg Hx   . Pancreatic cancer Neg Hx   . Stomach cancer Neg Hx   .  Throat cancer Neg Hx   . Liver disease Neg Hx   . Gout Son   . Atrial fibrillation Son   . Hyperlipidemia Son   . Cancer Maternal Grandfather     liver  . Cancer Paternal Grandmother     lung- doesn't think she smokes?  . Stroke Paternal Grandfather   . Cancer Son 50    non hodgin's lymphoma  . Gout Son   . Hyperlipidemia Son   . Sleep apnea Son     Social History   Social History  . Marital Status: Widowed    Spouse Name: N/A  . Number of Children: 3  . Years of Education: N/A   Occupational History  . retired Pharmacist, hospital    Social History Main Topics  . Smoking status: Never Smoker   . Smokeless tobacco: Never Used     Comment: never used tobacco  . Alcohol Use: No  . Drug Use: No  . Sexual Activity: No     Comment: lives [place], low sodium diet   Other Topics Concern  . Not on file   Social History Narrative    Outpatient Prescriptions Prior to Visit    Medication Sig Dispense Refill  . acetaminophen (TYLENOL) 650 MG CR tablet Take 1,300 mg by mouth every evening.    Marland Kitchen apixaban (ELIQUIS) 5 MG TABS tablet Take 1 tablet (5 mg total) by mouth 2 (two) times daily. 180 tablet 2  . atorvastatin (LIPITOR) 20 MG tablet Take 1 tablet (20 mg total) by mouth daily. 90 tablet 2  . bumetanide (BUMEX) 1 MG tablet TAKE ONE (1) TABLET EACH DAY 90 tablet 0  . calcium carbonate (OS-CAL) 600 MG TABS tablet Take 600 mg by mouth 2 (two) times daily with a meal.    . diltiazem (CARDIZEM CD) 240 MG 24 hr capsule Take 240 mg by mouth daily.    Marland Kitchen dofetilide (TIKOSYN) 250 MCG capsule Take 1 capsule (250 mcg total) by mouth 2 (two) times daily. 60 capsule 3  . Lactobacillus (PROBIOTIC ACIDOPHILUS) TABS Take 1 tablet by mouth daily.    Marland Kitchen lisinopril (PRINIVIL,ZESTRIL) 10 MG tablet Take 1 tablet (10 mg total) by mouth daily. 90 tablet 2  . loratadine (CLARITIN) 10 MG tablet Take 10 mg by mouth daily.     . potassium chloride (KLOR-CON 10) 10 MEQ tablet TAKE ONE (1) TABLET EACH DAY 90 tablet 2  . psyllium (HYDROCIL/METAMUCIL) 95 % PACK Take 1 packet by mouth 2 (two) times daily. 56 each 3  . zoster vaccine live, PF, (ZOSTAVAX) 23762 UNT/0.65ML injection Inject 19,400 Units into the skin once. 1 each 0  . ibuprofen (ADVIL,MOTRIN) 200 MG tablet Take 200 mg by mouth daily.     No facility-administered medications prior to visit.    Allergies  Allergen Reactions  . Gabapentin Swelling  . Lactose Intolerance (Gi) Other (See Comments)    Bothers her stomach  . Zebeta [Bisoprolol Fumarate] Nausea Only  . Penicillins Rash    Has patient had a PCN reaction causing immediate rash, facial/tongue/throat swelling, SOB or lightheadedness with hypotension: No Has patient had a PCN reaction causing severe rash involving mucus membranes or skin necrosis: No Has patient had a PCN reaction that required hospitalization No Has patient had a PCN reaction occurring within the last 10  years: No If all of the above answers are "NO", then may proceed with Cephalosporin use.  . Sulfa Antibiotics Rash    Review of Systems  Constitutional:  Negative for fever and malaise/fatigue.  HENT: Negative for congestion.   Eyes: Negative for blurred vision.  Respiratory: Negative for shortness of breath.   Cardiovascular: Negative for chest pain, palpitations and leg swelling.  Gastrointestinal: Negative for nausea, abdominal pain and blood in stool.  Genitourinary: Negative for dysuria and frequency.  Musculoskeletal: Negative for falls.  Skin: Negative for rash.  Neurological: Negative for dizziness, loss of consciousness and headaches.  Endo/Heme/Allergies: Negative for environmental allergies.  Psychiatric/Behavioral: Negative for depression. The patient is not nervous/anxious.        Objective:    Physical Exam  Constitutional: She is oriented to person, place, and time. She appears well-developed and well-nourished. No distress.  HENT:  Head: Normocephalic and atraumatic.  Eyes: Conjunctivae are normal.  Neck: Neck supple. No thyromegaly present.  Cardiovascular: Normal rate, regular rhythm and normal heart sounds.   No murmur heard. Pulmonary/Chest: Effort normal and breath sounds normal. No respiratory distress.  Abdominal: Soft. Bowel sounds are normal. She exhibits no distension and no mass. There is no tenderness.  Musculoskeletal: She exhibits no edema.  Lymphadenopathy:    She has no cervical adenopathy.  Neurological: She is alert and oriented to person, place, and time.  Skin: Skin is warm and dry.  Psychiatric: She has a normal mood and affect. Her behavior is normal.    BP 124/78 mmHg  Pulse 78  Temp(Src) 98.3 F (36.8 C) (Oral)  Ht 5' 3.5" (1.613 m)  Wt 261 lb 2 oz (118.446 kg)  BMI 45.53 kg/m2  SpO2 96% Wt Readings from Last 3 Encounters:  12/20/15 261 lb 2 oz (118.446 kg)  12/04/15 263 lb (119.296 kg)  11/20/15 262 lb (118.842 kg)      Lab Results  Component Value Date   WBC 6.0 12/20/2015   HGB 13.6 12/20/2015   HCT 40.3 12/20/2015   PLT 245 12/20/2015   GLUCOSE 97 12/20/2015   CHOL 169 09/06/2015   TRIG 129.0 09/06/2015   HDL 54.00 09/06/2015   LDLCALC 89 09/06/2015   ALT 13 12/20/2015   AST 16 12/20/2015   NA 142 12/20/2015   K 4.0 12/20/2015   CL 102 12/20/2015   CREATININE 0.94* 12/20/2015   BUN 17 12/20/2015   CO2 28 12/20/2015   TSH 2.18 09/06/2015   INR 1.55* 08/06/2015    Lab Results  Component Value Date   TSH 2.18 09/06/2015   Lab Results  Component Value Date   WBC 6.0 12/20/2015   HGB 13.6 12/20/2015   HCT 40.3 12/20/2015   MCV 92.6 12/20/2015   PLT 245 12/20/2015   Lab Results  Component Value Date   NA 142 12/20/2015   K 4.0 12/20/2015   CO2 28 12/20/2015   GLUCOSE 97 12/20/2015   BUN 17 12/20/2015   CREATININE 0.94* 12/20/2015   BILITOT 0.5 12/20/2015   ALKPHOS 87 12/20/2015   AST 16 12/20/2015   ALT 13 12/20/2015   PROT 6.6 12/20/2015   ALBUMIN 4.1 12/20/2015   CALCIUM 9.4 12/20/2015   ANIONGAP 13 12/04/2015   GFR 57.45* 09/06/2015   Lab Results  Component Value Date   CHOL 169 09/06/2015   Lab Results  Component Value Date   HDL 54.00 09/06/2015   Lab Results  Component Value Date   LDLCALC 89 09/06/2015   Lab Results  Component Value Date   TRIG 129.0 09/06/2015   Lab Results  Component Value Date   CHOLHDL 3 09/06/2015   No results found for: HGBA1C  Assessment & Plan:   Problem List Items Addressed This Visit    Atrial fibrillation ([place])    Tolerating Tikosyn and following with cardiology      Constipation    Encouraged increased hydration and fiber in diet. Daily probiotics. If bowels not moving can use MOM 2 tbls po in 4 oz of warm prune juice by mouth every 2-3 days. If no results then repeat in 4 hours with  Dulcolax suppository pr, may repeat again in 4 more hours as needed. Seek care if symptoms worsen. Consider daily Miralax  and/or Dulcolax if symptoms persist.       Essential hypertension    Well controlled, no changes to meds. Encouraged heart healthy diet such as the DASH diet and exercise as tolerated.       Hyperlipidemia, mixed    Tolerating statin, encouraged heart healthy diet, avoid trans fats, minimize simple carbs and saturated fats. Increase exercise as tolerated      Obesity    Encouraged DASH diet, decrease po intake and increase exercise as tolerated. Needs 7-8 hours of sleep nightly. Avoid trans fats, eat small, frequent meals every 4-5 hours with lean proteins, complex carbs and healthy fats. Minimize simple carbs, GMO foods.       Other Visit Diagnoses    Anemia, unspecified anemia type    -  Primary    Relevant Orders    CBC (Completed)    Comp Met (CMET) (Completed)    Hx of atrial fibrillation, no current medication        Relevant Orders    CBC (Completed)    Comp Met (CMET) (Completed)       I have discontinued Ms. Bowne's zoster vaccine live (PF) and ibuprofen. I am also having her maintain her calcium carbonate, loratadine, PROBIOTIC ACIDOPHILUS, acetaminophen, psyllium, potassium chloride, lisinopril, bumetanide, atorvastatin, apixaban, dofetilide, and diltiazem.  No orders of the defined types were placed in this encounter.     [name], MD'

## 2015-12-20 NOTE — Progress Notes (Signed)
Pre visit review using our clinic review tool, if applicable. No additional management support is needed unless otherwise documented below in the visit note. 

## 2015-12-20 NOTE — Patient Instructions (Signed)

## 2015-12-29 NOTE — Assessment & Plan Note (Signed)
Encouraged DASH diet, decrease po intake and increase exercise as tolerated. Needs 7-8 hours of sleep nightly. Avoid trans fats, eat small, frequent meals every 4-5 hours with lean proteins, complex carbs and healthy fats. Minimize simple carbs, GMO foods. 

## 2015-12-29 NOTE — Assessment & Plan Note (Signed)
Encouraged increased hydration and fiber in diet. Daily probiotics. If bowels not moving can use MOM 2 tbls po in 4 oz of warm prune juice by mouth every 2-3 days. If no results then repeat in 4 hours with  Dulcolax suppository pr, may repeat again in 4 more hours as needed. Seek care if symptoms worsen. Consider daily Miralax and/or Dulcolax if symptoms persist.  

## 2015-12-29 NOTE — Assessment & Plan Note (Signed)
Well controlled, no changes to meds. Encouraged heart healthy diet such as the DASH diet and exercise as tolerated.  °

## 2015-12-29 NOTE — Assessment & Plan Note (Signed)
Tolerating statin, encouraged heart healthy diet, avoid trans fats, minimize simple carbs and saturated fats. Increase exercise as tolerated 

## 2015-12-29 NOTE — Assessment & Plan Note (Signed)
Tolerating Tikosyn and following with cardiology

## 2015-12-30 ENCOUNTER — Ambulatory Visit (INDEPENDENT_AMBULATORY_CARE_PROVIDER_SITE_OTHER): Payer: Medicare Other | Admitting: Physician Assistant

## 2015-12-30 ENCOUNTER — Encounter: Payer: Self-pay | Admitting: Physician Assistant

## 2015-12-30 VITALS — BP 136/86 | HR 87 | Temp 98.4°F | Ht 63.5 in | Wt 260.6 lb

## 2015-12-30 DIAGNOSIS — J019 Acute sinusitis, unspecified: Secondary | ICD-10-CM | POA: Diagnosis not present

## 2015-12-30 DIAGNOSIS — B9689 Other specified bacterial agents as the cause of diseases classified elsewhere: Secondary | ICD-10-CM

## 2015-12-30 MED ORDER — BENZONATATE 100 MG PO CAPS: 100.0000 mg | ORAL_CAPSULE | Freq: Three times a day (TID) | ORAL | Status: DC | PRN

## 2015-12-30 MED ORDER — DOXYCYCLINE HYCLATE 100 MG PO CAPS: 100.0000 mg | ORAL_CAPSULE | Freq: Two times a day (BID) | ORAL | Status: DC

## 2015-12-30 NOTE — Progress Notes (Signed)
Patient presents to clinic today c/o 1 week of chest congestion and productive cough. Endorses sputum is mostly clear but sometimes with color. Endorses sinus pressure, nasal congestion and sinus pain. Denies fever, chills. Endorses episode of diarrhea and vomiting on Friday that was completely resolved within 24 hours. Endorses the cough has been keeping her from wearing her CPAP for more than a couple of hours. .   Past Medical History  Diagnosis Date  . Hypertension   . Heart murmur   . Coronary artery disease   . Atrial fibrillation ([place])   . Anemia   . Arthritis   . Colon polyp   . Personal history of DVT (deep vein thrombosis) X 2    "left"  . Hyperlipidemia   . Pancreatitis     post hysterectomy  . Sleep apnea   . Aortic stenosis     Very mild  . Hypercholesteremia   . OSA on CPAP   . PAF (paroxysmal atrial fibrillation) ([place])   . History of valvular heart disease     LEFT  . Chicken pox as a child  . Measles as a child  . Mumps child and teenager  . Allergy   . Obesity   . Unspecified constipation 11/16/2013  . Heme positive stool 11/16/2013  . DDD (degenerative disc disease) 11/16/2013  . Unspecified sleep apnea 11/19/2013  . Allergic state 11/19/2013  . OA (osteoarthritis) 11/19/2013    S/p L TKR  . Benign paroxysmal positional vertigo 12/17/2013  . Abnormal results of thyroid function studies 12/17/2013  . Iron deficiency anemia 11/13/2013  . Personal history of colonic polyps 02/25/2014  . Spinal stenosis   . Obstructive sleep apnea 02/22/2015  . Visit for monitoring Tikosyn therapy 08/11/2015  . Medicare annual wellness visit, subsequent 09/15/2015    Current Outpatient Prescriptions on File Prior to Visit  Medication Sig Dispense Refill  . acetaminophen (TYLENOL) 650 MG CR tablet Take 1,300 mg by mouth every evening.    Marland Kitchen apixaban (ELIQUIS) 5 MG TABS tablet Take 1 tablet (5 mg total) by mouth 2 (two) times daily. 180 tablet 2  . atorvastatin (LIPITOR) 20 MG  tablet Take 1 tablet (20 mg total) by mouth daily. 90 tablet 2  . bumetanide (BUMEX) 1 MG tablet TAKE ONE (1) TABLET EACH DAY 90 tablet 0  . calcium carbonate (OS-CAL) 600 MG TABS tablet Take 600 mg by mouth 2 (two) times daily with a meal.    . diltiazem (CARDIZEM CD) 240 MG 24 hr capsule Take 240 mg by mouth daily.    Marland Kitchen dofetilide (TIKOSYN) 250 MCG capsule Take 1 capsule (250 mcg total) by mouth 2 (two) times daily. 60 capsule 3  . Lactobacillus (PROBIOTIC ACIDOPHILUS) TABS Take 1 tablet by mouth daily.    Marland Kitchen lisinopril (PRINIVIL,ZESTRIL) 10 MG tablet Take 1 tablet (10 mg total) by mouth daily. 90 tablet 2  . loratadine (CLARITIN) 10 MG tablet Take 10 mg by mouth daily.     . potassium chloride (KLOR-CON 10) 10 MEQ tablet TAKE ONE (1) TABLET EACH DAY 90 tablet 2  . psyllium (HYDROCIL/METAMUCIL) 95 % PACK Take 1 packet by mouth 2 (two) times daily. 56 each 3   No current facility-administered medications on file prior to visit.    Allergies  Allergen Reactions  . Gabapentin Swelling  . Lactose Intolerance (Gi) Other (See Comments)    Bothers her stomach  . Zebeta [Bisoprolol Fumarate] Nausea Only  . Penicillins Rash      .  Sulfa Antibiotics Rash    Family History  Problem Relation Age of Onset  . Heart attack Mother 78  . Hyperlipidemia Mother     ?  Marland Kitchen Dementia Mother   . Pernicious anemia Mother   . COPD Father     smoker  . Cancer Father 31    prostate  . Heart disease Brother     quadruple bipass surgery  . Hyperlipidemia Brother   . Hypertension Brother   . Diabetes Maternal Grandmother     type 2  . Pernicious anemia Maternal Grandmother   . Colon cancer Neg Hx   . Pancreatic cancer Neg Hx   . Stomach cancer Neg Hx   . Throat cancer Neg Hx   . Liver disease Neg Hx   . Gout Son   . Atrial fibrillation Son   . Hyperlipidemia Son   . Cancer Maternal Grandfather     liver  . Cancer Paternal Grandmother     lung- doesn't think she smokes?  . Stroke Paternal  Grandfather   . Cancer Son 50    non hodgin's lymphoma  . Gout Son   . Hyperlipidemia Son   . Sleep apnea Son     Social History   Social History  . Marital Status: Widowed    Spouse Name: N/A  . Number of Children: 3  . Years of Education: N/A   Occupational History  . retired Pharmacist, hospital    Social History Main Topics  . Smoking status: Never Smoker   . Smokeless tobacco: Never Used     Comment: never used tobacco  . Alcohol Use: No  . Drug Use: No  . Sexual Activity: No     Comment: [place], low sodium diet   Other Topics Concern  . None   Social History Narrative    Review of Systems - See HPI.  All other ROS are negative.  BP 136/86 mmHg  Pulse 87  Temp(Src) 98.4 F (36.9 C) (Oral)  Ht 5' 3.5" (1.613 m)  Wt 260 lb 9.6 oz (118.207 kg)  BMI 45.43 kg/m2  SpO2 98%  Physical Exam  Constitutional: She is oriented to person, place, and time and well-developed, well-nourished, and in no distress.  HENT:  Head: Normocephalic and atraumatic.  Right Ear: Tympanic membrane normal.  Left Ear: Tympanic membrane normal.  Nose: Left sinus exhibits maxillary sinus tenderness and frontal sinus tenderness.  Mouth/Throat: Uvula is midline, oropharynx is clear and moist and mucous membranes are normal.  Eyes: Conjunctivae are normal.  Neck: Neck supple.  Cardiovascular: Normal rate, regular rhythm, normal heart sounds and intact distal pulses.   Pulmonary/Chest: Effort normal and breath sounds normal. No respiratory distress. She has no wheezes. She has no rales. She exhibits no tenderness.  Neurological: She is alert and oriented to person, place, and time.  Skin: Skin is warm and dry. No rash noted.  Psychiatric: Affect normal.  Vitals reviewed.   Recent Results (from the past 2160 hour(s))  POCT urinalysis dipstick     Status: Abnormal   Collection Time: 11/20/15  2:59 PM  Result Value Ref Range   Color, UA Yellow    Clarity, UA clear    Glucose, UA  negative    Bilirubin, UA negative    Ketones, UA negative    Spec Grav, UA 1.015    Blood, UA 2+    pH, UA 6.0    Protein, UA negative    Urobilinogen, UA negative  Nitrite, UA negative    Leukocytes, UA Trace (A) Negative  Urine culture     Status: None   Collection Time: 11/20/15  4:57 PM  Result Value Ref Range   Culture CITROBACTER KOSERI    Colony Count 20,OOO COLONIES/ML    Organism ID, Bacteria CITROBACTER KOSERI       Susceptibility   Citrobacter koseri -  (no method available)    AMOX/CLAVULANIC 4 Sensitive     PIP/TAZO <=4 Sensitive     IMIPENEM <=0.25 Sensitive     CEFAZOLIN <=4 Not Reportable     CEFTRIAXONE <=1 Sensitive     CEFTAZIDIME <=1 Sensitive     CEFEPIME <=1 Sensitive     GENTAMICIN <=1 Sensitive     TOBRAMYCIN <=1 Sensitive     CIPROFLOXACIN <=0.25 Sensitive     LEVOFLOXACIN <=0.12 Sensitive     NITROFURANTOIN 32 Sensitive     TRIMETH/SULFA* <=20 Sensitive      * NR=NOT REPORTABLE,SEE COMMENTORAL therapy:A cefazolin MIC of <32 predicts susceptibility to the oral agents cefaclor,cefdinir,cefpodoxime,cefprozil,cefuroxime,cephalexin,and loracarbef when used for therapy of uncomplicated UTIs due to E.coli,K.pneumomiae,and P.mirabilis. PARENTERAL therapy: A cefazolinMIC of >8 indicates resistance to parenteralcefazolin. An alternate test method must beperformed to confirm susceptibility to parenteralcefazolin.  Basic metabolic panel     Status: Abnormal   Collection Time: 12/04/15  2:16 PM  Result Value Ref Range   Sodium 142 135 - 145 mmol/L   Potassium 3.9 3.5 - 5.1 mmol/L   Chloride 102 101 - 111 mmol/L   CO2 27 22 - 32 mmol/L   Glucose, Bld 99 65 - 99 mg/dL   BUN 17 6 - 20 mg/dL   Creatinine, Ser 1.05 (H) 0.44 - 1.00 mg/dL   Calcium 9.4 8.9 - 10.3 mg/dL   GFR calc non Af Amer 49 (L) >60 mL/min   GFR calc Af Amer 57 (L) >60 mL/min    Comment: (NOTE) The eGFR has been calculated using the CKD EPI equation. This calculation has not been validated  in all clinical situations. eGFR's persistently <60 mL/min signify possible Chronic Kidney Disease.    Anion gap 13 5 - 15  CBC     Status: None   Collection Time: 12/20/15  4:14 PM  Result Value Ref Range   WBC 6.0 3.8 - 10.8 K/uL   RBC 4.35 3.80 - 5.10 MIL/uL   Hemoglobin 13.6 11.7 - 15.5 g/dL   HCT 40.3 35.0 - 45.0 %   MCV 92.6 80.0 - 100.0 fL   MCH 31.3 27.0 - 33.0 pg   MCHC 33.7 32.0 - 36.0 g/dL   RDW 13.3 11.0 - 15.0 %   Platelets 245 140 - 400 K/uL   MPV 9.4 7.5 - 12.5 fL    Comment: ** Please note change in unit of measure and reference range(s). **  Comp Met (CMET)     Status: Abnormal   Collection Time: 12/20/15  4:14 PM  Result Value Ref Range   Sodium 142 135 - 146 mmol/L   Potassium 4.0 3.5 - 5.3 mmol/L   Chloride 102 98 - 110 mmol/L   CO2 28 20 - 31 mmol/L   Glucose, Bld 97 65 - 99 mg/dL   BUN 17 7 - 25 mg/dL   Creat 0.94 (H) 0.60 - 0.93 mg/dL   Total Bilirubin 0.5 0.2 - 1.2 mg/dL   Alkaline Phosphatase 87 33 - 130 U/L   AST 16 10 - 35 U/L   ALT 13 6 - 29  U/L   Total Protein 6.6 6.1 - 8.1 g/dL   Albumin 4.1 3.6 - 5.1 g/dL   Calcium 9.4 8.6 - 10.4 mg/dL    Assessment/Plan: 1. Acute bacterial sinusitis Rx Doxycycline.  Increase fluids.  Rest.  Saline nasal spray.  Probiotic.  Mucinex as directed.  Humidifier in bedroom. Tessalon for cough.  Call or return to clinic if symptoms are not improving.

## 2015-12-30 NOTE — Patient Instructions (Signed)
Please take antibiotic as directed.  Increase fluid intake.  Use Saline nasal spray.  Take a daily multivitamin. Get some plain Mucinex to take twice daily. NOT Mucinex-D or DM.  Place a humidifier in the bedroom.  Please call or return clinic if symptoms are not improving.  Sinusitis Sinusitis is redness, soreness, and swelling (inflammation) of the paranasal sinuses. Paranasal sinuses are air pockets within the bones of your face (beneath the eyes, the middle of the forehead, or above the eyes). In healthy paranasal sinuses, mucus is able to drain out, and air is able to circulate through them by way of your nose. However, when your paranasal sinuses are inflamed, mucus and air can become trapped. This can allow bacteria and other germs to grow and cause infection. Sinusitis can develop quickly and last only a short time (acute) or continue over a long period (chronic). Sinusitis that lasts for more than 12 weeks is considered chronic.  CAUSES  Causes of sinusitis include:  Allergies.  Structural abnormalities, such as displacement of the cartilage that separates your nostrils (deviated septum), which can decrease the air flow through your nose and sinuses and affect sinus drainage.  Functional abnormalities, such as when the small hairs (cilia) that line your sinuses and help remove mucus do not work properly or are not present. SYMPTOMS  Symptoms of acute and chronic sinusitis are the same. The primary symptoms are pain and pressure around the affected sinuses. Other symptoms include:  Upper toothache.  Earache.  Headache.  Bad breath.  Decreased sense of smell and taste.  A cough, which worsens when you are lying flat.  Fatigue.  Fever.  Thick drainage from your nose, which often is green and may contain pus (purulent).  Swelling and warmth over the affected sinuses. DIAGNOSIS  Your caregiver will perform a physical exam. During the exam, your caregiver may:  Look in your  nose for signs of abnormal growths in your nostrils (nasal polyps).  Tap over the affected sinus to check for signs of infection.  View the inside of your sinuses (endoscopy) with a special imaging device with a light attached (endoscope), which is inserted into your sinuses. If your caregiver suspects that you have chronic sinusitis, one or more of the following tests may be recommended:  Allergy tests.  Nasal culture A sample of mucus is taken from your nose and sent to a lab and screened for bacteria.  Nasal cytology A sample of mucus is taken from your nose and examined by your caregiver to determine if your sinusitis is related to an allergy. TREATMENT  Most cases of acute sinusitis are related to a viral infection and will resolve on their own within 10 days. Sometimes medicines are prescribed to help relieve symptoms (pain medicine, decongestants, nasal steroid sprays, or saline sprays).  However, for sinusitis related to a bacterial infection, your caregiver will prescribe antibiotic medicines. These are medicines that will help kill the bacteria causing the infection.  Rarely, sinusitis is caused by a fungal infection. In theses cases, your caregiver will prescribe antifungal medicine. For some cases of chronic sinusitis, surgery is needed. Generally, these are cases in which sinusitis recurs more than 3 times per year, despite other treatments. HOME CARE INSTRUCTIONS   Drink plenty of water. Water helps thin the mucus so your sinuses can drain more easily.  Use a humidifier.  Inhale steam 3 to 4 times a day (for example, sit in the bathroom with the shower running).  Apply a  warm, moist washcloth to your face 3 to 4 times a day, or as directed by your caregiver.  Use saline nasal sprays to help moisten and clean your sinuses.  Take over-the-counter or prescription medicines for pain, discomfort, or fever only as directed by your caregiver. SEEK IMMEDIATE MEDICAL CARE  IF:  You have increasing pain or severe headaches.  You have nausea, vomiting, or drowsiness.  You have swelling around your face.  You have vision problems.  You have a stiff neck.  You have difficulty breathing. MAKE SURE YOU:   Understand these instructions.  Will watch your condition.  Will get help right away if you are not doing well or get worse. Document Released: 08/17/2005 Document Revised: 11/09/2011 Document Reviewed: 09/01/2011 [facility] Patient Information 2014 [place], [place].

## 2016-02-04 ENCOUNTER — Other Ambulatory Visit: Payer: Self-pay | Admitting: Family Medicine

## 2016-03-04 ENCOUNTER — Other Ambulatory Visit: Payer: Self-pay

## 2016-03-04 ENCOUNTER — Encounter (HOSPITAL_COMMUNITY): Payer: Self-pay | Admitting: Nurse Practitioner

## 2016-03-04 ENCOUNTER — Other Ambulatory Visit (HOSPITAL_COMMUNITY): Payer: Self-pay | Admitting: *Deleted

## 2016-03-04 ENCOUNTER — Ambulatory Visit (HOSPITAL_COMMUNITY)
Admission: RE | Admit: 2016-03-04 | Discharge: 2016-03-04 | Disposition: A | Discharge status: Home / Self Care | Payer: Medicare Other | Source: Ambulatory Visit | Attending: Nurse Practitioner | Admitting: Nurse Practitioner

## 2016-03-04 VITALS — BP 138/74 | HR 75 | Ht 63.5 in | Wt 260.8 lb

## 2016-03-04 DIAGNOSIS — Z79899 Other long term (current) drug therapy: Secondary | ICD-10-CM | POA: Insufficient documentation

## 2016-03-04 DIAGNOSIS — I10 Essential (primary) hypertension: Secondary | ICD-10-CM | POA: Insufficient documentation

## 2016-03-04 DIAGNOSIS — Z7901 Long term (current) use of anticoagulants: Secondary | ICD-10-CM | POA: Diagnosis not present

## 2016-03-04 DIAGNOSIS — M1990 Unspecified osteoarthritis, unspecified site: Secondary | ICD-10-CM | POA: Diagnosis not present

## 2016-03-04 DIAGNOSIS — I480 Paroxysmal atrial fibrillation: Secondary | ICD-10-CM

## 2016-03-04 DIAGNOSIS — I440 Atrioventricular block, first degree: Secondary | ICD-10-CM | POA: Diagnosis not present

## 2016-03-04 DIAGNOSIS — I4891 Unspecified atrial fibrillation: Secondary | ICD-10-CM | POA: Diagnosis present

## 2016-03-04 DIAGNOSIS — E7800 Pure hypercholesterolemia, unspecified: Secondary | ICD-10-CM | POA: Insufficient documentation

## 2016-03-04 DIAGNOSIS — I2510 Atherosclerotic heart disease of native coronary artery without angina pectoris: Secondary | ICD-10-CM | POA: Diagnosis not present

## 2016-03-04 DIAGNOSIS — G4733 Obstructive sleep apnea (adult) (pediatric): Secondary | ICD-10-CM | POA: Insufficient documentation

## 2016-03-04 DIAGNOSIS — I4819 Other persistent atrial fibrillation: Secondary | ICD-10-CM

## 2016-03-04 LAB — BASIC METABOLIC PANEL
ANION GAP: 8 (ref 5–15)
BUN: 18 mg/dL (ref 6–20)
CO2: 27 mmol/L (ref 22–32)
Calcium: 9.5 mg/dL (ref 8.9–10.3)
Chloride: 103 mmol/L (ref 101–111)
Creatinine, Ser: 0.94 mg/dL (ref 0.44–1.00)
GFR, EST NON AFRICAN AMERICAN: 56 mL/min — AB (ref >60)
GLUCOSE: 111 mg/dL — AB (ref 65–99)
POTASSIUM: 3.8 mmol/L (ref 3.5–5.1)
SODIUM: 138 mmol/L (ref 135–145)

## 2016-03-04 LAB — MAGNESIUM: MAGNESIUM: 2.1 mg/dL (ref 1.7–2.4)

## 2016-03-04 MED ORDER — POTASSIUM CHLORIDE ER 10 MEQ PO TBCR: EXTENDED_RELEASE_TABLET | ORAL | Status: DC

## 2016-03-04 NOTE — Progress Notes (Signed)
Patient ID: Tracy Miles, female   DOB: Sep 23, 1933, 82 y.o.   MRN: [id number]     Primary Care Physician: [name], MD Referring Physician: Dr. Lillette Boxer Mcneish is a 82 y.o. female with a h/o  PAF on tikosyn for f/u. She returns today and has not noticed any afib. She continue on eliquis with a chadsvasc score of at least 5. No bleeding issues. Has some occasional positional dizziness worse when it is hot.   Today, she denies symptoms of palpitations, chest pain, shortness of breath, orthopnea, PND, lower extremity edema, dizziness, presyncope, syncope, or neurologic sequela. The patient is tolerating medications without difficulties and is otherwise without complaint today.   Past Medical History  Diagnosis Date  . Hypertension   . Heart murmur   . Coronary artery disease   . Atrial fibrillation ([place])   . Anemia   . Arthritis   . Colon polyp   . Personal history of DVT (deep vein thrombosis) X 2    "left"  . Hyperlipidemia   . Pancreatitis     post hysterectomy  . Sleep apnea   . Aortic stenosis     Very mild  . Hypercholesteremia   . OSA on CPAP   . PAF (paroxysmal atrial fibrillation) ([place])   . History of valvular heart disease     LEFT  . Chicken pox as a child  . Measles as a child  . Mumps child and teenager  . Allergy   . Obesity   . Unspecified constipation 11/16/2013  . Heme positive stool 11/16/2013  . DDD (degenerative disc disease) 11/16/2013  . Unspecified sleep apnea 11/19/2013  . Allergic state 11/19/2013  . OA (osteoarthritis) 11/19/2013    S/p L TKR  . Benign paroxysmal positional vertigo 12/17/2013  . Abnormal results of thyroid function studies 12/17/2013  . Iron deficiency anemia 11/13/2013  . Personal history of colonic polyps 02/25/2014  . Spinal stenosis   . Obstructive sleep apnea 02/22/2015  . Visit for monitoring Tikosyn therapy 08/11/2015  . Medicare annual wellness visit, subsequent 09/15/2015   Past Surgical History  Procedure  Laterality Date  . Tonsilectomy, adenoidectomy, bilateral myringotomy and tubes  child  . Lumbar laminectomy  40 yrs ago    "L3-4"  . Tubal ligation  82 years old  . Total abdominal hysterectomy  1995    had 2 tumors- benign  . Replacement total knee Left 2013  . Cyst removal hand Bilateral 1990's    "played to much golf"  . Meniscus repair Bilateral 2000 and 2010  . Appendectomy    . Tonsillectomy and adenoidectomy    . Joint replacement    . Back surgery    . Cataract extraction w/ intraocular lens  implant, bilateral Bilateral 2016  . Dilation and curettage of uterus    . Cardioversion N/A 08/09/2015    Procedure: CARDIOVERSION;  Surgeon: Jerline Pain, MD;  Location: [facility];  Service: Cardiovascular;  Laterality: N/A;  . Eye surgery  2016    cataracts    Current Outpatient Prescriptions  Medication Sig Dispense Refill  . acetaminophen (TYLENOL) 650 MG CR tablet Take 1,300 mg by mouth every evening.    Marland Kitchen apixaban (ELIQUIS) 5 MG TABS tablet Take 1 tablet (5 mg total) by mouth 2 (two) times daily. 180 tablet 2  . atorvastatin (LIPITOR) 20 MG tablet Take 1 tablet (20 mg total) by mouth daily. 90 tablet 2  . bumetanide (BUMEX) 1 MG tablet TAKE ONE (  1) TABLET BY MOUTH EVERY DAY 90 tablet 0  . calcium carbonate (OS-CAL) 600 MG TABS tablet Take 600 mg by mouth 2 (two) times daily with a meal.    . diltiazem (CARDIZEM CD) 240 MG 24 hr capsule Take 240 mg by mouth daily.    Marland Kitchen dofetilide (TIKOSYN) 250 MCG capsule Take 1 capsule (250 mcg total) by mouth 2 (two) times daily. 60 capsule 3  . Lactobacillus (PROBIOTIC ACIDOPHILUS) TABS Take 1 tablet by mouth daily. Reported on 03/04/2016    . lisinopril (PRINIVIL,ZESTRIL) 10 MG tablet Take 1 tablet (10 mg total) by mouth daily. 90 tablet 2  . loratadine (CLARITIN) 10 MG tablet Take 10 mg by mouth daily.     . potassium chloride (KLOR-CON 10) 10 MEQ tablet TAKE ONE (1) TABLET EACH DAY 90 tablet 2  . psyllium (REGULOID) 0.52 g capsule Take  0.52 g by mouth daily.     No current facility-administered medications for this encounter.    Allergies  Allergen Reactions  . Gabapentin Swelling  . Lactose Intolerance (Gi) Other (See Comments)    Bothers her stomach  . Zebeta [Bisoprolol Fumarate] Nausea Only  . Penicillins Rash    Social History   Social History  . Marital Status: Widowed    Spouse Name: N/A  . Number of Children: 3  . Years of Education: N/A   Occupational History  . retired Pharmacist, hospital    Social History Main Topics  . Smoking status: Never Smoker   . Smokeless tobacco: Never Used     Comment: never used tobacco  . Alcohol Use: No  . Drug Use: No  . Sexual Activity: No     Comment: lives at [place], low sodium diet   Other Topics Concern  . Not on file   Social History Narrative    Family History  Problem Relation Age of Onset  . Heart attack Mother 37  . Hyperlipidemia Mother     ?  Marland Kitchen Dementia Mother   . Pernicious anemia Mother   . COPD Father     smoker  . Cancer Father 75    prostate  . Heart disease Brother     quadruple bipass surgery  . Hyperlipidemia Brother   . Hypertension Brother   . Diabetes Maternal Grandmother     type 2  . Pernicious anemia Maternal Grandmother   . Colon cancer Neg Hx   . Pancreatic cancer Neg Hx   . Stomach cancer Neg Hx   . Throat cancer Neg Hx   . Liver disease Neg Hx   . Gout Son   . Atrial fibrillation Son   . Hyperlipidemia Son   . Cancer Maternal Grandfather     liver  . Cancer Paternal Grandmother     lung- doesn't think she smokes?  . Stroke Paternal Grandfather   . Cancer Son 50    non hodgin's lymphoma  . Gout Son   . Hyperlipidemia Son   . Sleep apnea Son     ROS- All systems are reviewed and negative except as per the HPI above  Physical Exam: Filed Vitals:   03/04/16 1351  BP: 138/74  Pulse: 75  Height: 5' 3.5" (1.613 m)  Weight: 260 lb 12.8 oz (118.298 kg)    GEN- The patient is well appearing, alert and  oriented x 3 today.   Head- normocephalic, atraumatic Eyes-  Sclera clear, conjunctiva pink Ears- hearing intact Oropharynx- clear Neck- supple, no JVP Lymph- no cervical  lymphadenopathy Lungs- Clear to ausculation bilaterally, normal work of breathing Heart- Regular rate and rhythm, no murmurs, rubs or gallops, PMI not laterally displaced GI- soft, NT, ND, + BS Extremities- no clubbing, cyanosis, or edema MS- no significant deformity or atrophy Skin- no rash or lesion Psych- euthymic mood, full affect Neuro- strength and sensation are intact  EKG- NSR at 75 bpm, Pr int 188 ms, QRS int 70 ms, Qtc 453 ms Epic records reviewed  Assessment and Plan: 1. PAF Doing well on tikosyn, continue 250 mg bid Continue apixaban Bmet, mag  F/u in 3 months  Butch Penny C. Ginelle Bays, [facility] [address] [place], [place] [address] [phone]

## 2016-03-04 NOTE — Patient Instructions (Signed)
[phone] (Eliquis)

## 2016-03-05 ENCOUNTER — Other Ambulatory Visit: Payer: Self-pay | Admitting: Family Medicine

## 2016-03-18 ENCOUNTER — Other Ambulatory Visit (INDEPENDENT_AMBULATORY_CARE_PROVIDER_SITE_OTHER): Payer: Medicare Other

## 2016-03-18 DIAGNOSIS — I481 Persistent atrial fibrillation: Secondary | ICD-10-CM | POA: Diagnosis not present

## 2016-03-18 DIAGNOSIS — I4819 Other persistent atrial fibrillation: Secondary | ICD-10-CM

## 2016-03-18 LAB — BASIC METABOLIC PANEL
BUN: 20 mg/dL (ref 6–23)
CHLORIDE: 97 meq/L (ref 96–112)
CO2: 28 mEq/L (ref 19–32)
CREATININE: 1 mg/dL (ref 0.40–1.20)
Calcium: 9.7 mg/dL (ref 8.4–10.5)
GFR: 56.71 mL/min — ABNORMAL LOW (ref >60.00)
GLUCOSE: 114 mg/dL — AB (ref 70–99)
POTASSIUM: 3.8 meq/L (ref 3.5–5.1)
Sodium: 135 mEq/L (ref 135–145)

## 2016-03-19 ENCOUNTER — Other Ambulatory Visit (HOSPITAL_COMMUNITY): Payer: Self-pay | Admitting: *Deleted

## 2016-03-19 DIAGNOSIS — I4819 Other persistent atrial fibrillation: Secondary | ICD-10-CM

## 2016-03-20 ENCOUNTER — Other Ambulatory Visit: Payer: Self-pay | Admitting: Family Medicine

## 2016-03-20 DIAGNOSIS — E876 Hypokalemia: Secondary | ICD-10-CM

## 2016-04-03 ENCOUNTER — Other Ambulatory Visit (INDEPENDENT_AMBULATORY_CARE_PROVIDER_SITE_OTHER): Payer: Medicare Other

## 2016-04-03 DIAGNOSIS — E876 Hypokalemia: Secondary | ICD-10-CM | POA: Diagnosis not present

## 2016-04-03 LAB — COMPREHENSIVE METABOLIC PANEL
ALT: 10 U/L (ref 0–35)
AST: 14 U/L (ref 0–37)
Albumin: 4.3 g/dL (ref 3.5–5.2)
Alkaline Phosphatase: 76 U/L (ref 39–117)
BILIRUBIN TOTAL: 0.6 mg/dL (ref 0.2–1.2)
BUN: 22 mg/dL (ref 6–23)
CO2: 28 meq/L (ref 19–32)
CREATININE: 0.97 mg/dL (ref 0.40–1.20)
Calcium: 9.6 mg/dL (ref 8.4–10.5)
Chloride: 102 mEq/L (ref 96–112)
GFR: 58.73 mL/min — AB (ref >60.00)
GLUCOSE: 99 mg/dL (ref 70–99)
Potassium: 4.4 mEq/L (ref 3.5–5.1)
Sodium: 138 mEq/L (ref 135–145)
Total Protein: 7 g/dL (ref 6.0–8.3)

## 2016-04-17 ENCOUNTER — Other Ambulatory Visit: Payer: Self-pay | Admitting: Family Medicine

## 2016-04-17 DIAGNOSIS — Z1231 Encounter for screening mammogram for malignant neoplasm of breast: Secondary | ICD-10-CM

## 2016-04-20 ENCOUNTER — Other Ambulatory Visit (HOSPITAL_COMMUNITY): Payer: Self-pay | Admitting: *Deleted

## 2016-04-20 ENCOUNTER — Ambulatory Visit: Payer: Medicare Other | Admitting: Family Medicine

## 2016-04-20 ENCOUNTER — Ambulatory Visit (INDEPENDENT_AMBULATORY_CARE_PROVIDER_SITE_OTHER): Payer: Medicare Other | Admitting: Family Medicine

## 2016-04-20 DIAGNOSIS — I10 Essential (primary) hypertension: Secondary | ICD-10-CM

## 2016-04-20 DIAGNOSIS — G4733 Obstructive sleep apnea (adult) (pediatric): Secondary | ICD-10-CM | POA: Diagnosis not present

## 2016-04-20 DIAGNOSIS — E782 Mixed hyperlipidemia: Secondary | ICD-10-CM | POA: Diagnosis not present

## 2016-04-20 DIAGNOSIS — E669 Obesity, unspecified: Secondary | ICD-10-CM | POA: Diagnosis not present

## 2016-04-20 DIAGNOSIS — I480 Paroxysmal atrial fibrillation: Secondary | ICD-10-CM

## 2016-04-20 LAB — LIPID PANEL
CHOL/HDL RATIO: 3
CHOLESTEROL: 166 mg/dL (ref 0–200)
HDL: 48.1 mg/dL (ref >39.00)
LDL CALC: 85 mg/dL (ref 0–99)
NONHDL: 118.12
Triglycerides: 165 mg/dL — ABNORMAL HIGH (ref 0.0–149.0)
VLDL: 33 mg/dL (ref 0.0–40.0)

## 2016-04-20 LAB — CBC
HEMATOCRIT: 38.9 % (ref 36.0–46.0)
HEMOGLOBIN: 13.3 g/dL (ref 12.0–15.0)
MCHC: 34.1 g/dL (ref 30.0–36.0)
MCV: 93.3 fl (ref 78.0–100.0)
PLATELETS: 229 10*3/uL (ref 150.0–400.0)
RBC: 4.17 Mil/uL (ref 3.87–5.11)
RDW: 12.7 % (ref 11.5–15.5)
WBC: 6.7 10*3/uL (ref 4.0–10.5)

## 2016-04-20 LAB — TSH: TSH: 3.13 u[IU]/mL (ref 0.35–4.50)

## 2016-04-20 MED ORDER — BUMETANIDE 1 MG PO TABS: 1.0000 mg | ORAL_TABLET | Freq: Every day | ORAL | 1 refills | Status: DC

## 2016-04-20 MED ORDER — ATORVASTATIN CALCIUM 20 MG PO TABS: 20.0000 mg | ORAL_TABLET | Freq: Every day | ORAL | 1 refills | Status: DC

## 2016-04-20 MED ORDER — POTASSIUM CHLORIDE ER 10 MEQ PO TBCR: EXTENDED_RELEASE_TABLET | ORAL | 3 refills | Status: DC

## 2016-04-20 MED ORDER — LISINOPRIL 10 MG PO TABS: 10.0000 mg | ORAL_TABLET | Freq: Every day | ORAL | 1 refills | Status: DC

## 2016-04-20 NOTE — Progress Notes (Signed)
Pre visit review using our clinic review tool, if applicable. No additional management support is needed unless otherwise documented below in the visit note. 

## 2016-04-20 NOTE — Assessment & Plan Note (Signed)
Wears machine nightly and at naps.

## 2016-04-20 NOTE — Assessment & Plan Note (Signed)
Well controlled, no changes to meds. Encouraged heart healthy diet such as the DASH diet and exercise as tolerated.  °

## 2016-04-20 NOTE — Assessment & Plan Note (Signed)
Encouraged heart healthy diet, increase exercise, avoid trans fats, consider a krill oil cap daily 

## 2016-04-20 NOTE — Patient Instructions (Addendum)
Diarrhea Diarrhea is frequent loose and watery bowel movements. It can cause you to feel weak and dehydrated. Dehydration can cause you to become tired and thirsty, have a dry mouth, and have decreased urination that often is dark yellow. Diarrhea is a sign of another problem, most often an infection that will not last long. In most cases, diarrhea typically lasts 2-3 days. However, it can last longer if it is a sign of something more serious. It is important to treat your diarrhea as directed by your caregiver to lessen or prevent future episodes of diarrhea. CAUSES  Some common causes include:  Gastrointestinal infections caused by viruses, bacteria, or parasites.  Food poisoning or food allergies.  Certain medicines, such as antibiotics, chemotherapy, and laxatives.  Artificial sweeteners and fructose.  Digestive disorders. HOME CARE INSTRUCTIONS  Ensure adequate fluid intake (hydration): Have 1 cup (8 oz) of fluid for each diarrhea episode. Avoid fluids that contain simple sugars or sports drinks, fruit juices, whole milk products, and sodas. Your urine should be clear or pale yellow if you are drinking enough fluids. Hydrate with an oral rehydration solution that you can purchase at pharmacies, retail stores, and online. You can prepare an oral rehydration solution at home by mixing the following ingredients together:   - tsp table salt.   tsp baking soda.   tsp salt substitute containing potassium chloride.  1  tablespoons sugar.  1 L (34 oz) of water.  Certain foods and beverages may increase the speed at which food moves through the gastrointestinal (GI) tract. These foods and beverages should be avoided and include:  Caffeinated and alcoholic beverages.  High-fiber foods, such as raw fruits and vegetables, nuts, seeds, and whole grain breads and cereals.  Foods and beverages sweetened with sugar alcohols, such as xylitol, sorbitol, and mannitol.  Some foods may be well  tolerated and may help thicken stool including:  Starchy foods, such as rice, toast, pasta, low-sugar cereal, oatmeal, grits, baked potatoes, crackers, and bagels.  Bananas.  Applesauce.  Add probiotic-rich foods to help increase healthy bacteria in the GI tract, such as yogurt and fermented milk products.  Wash your hands well after each diarrhea episode.  Only take over-the-counter or prescription medicines as directed by your caregiver.  Take a warm bath to relieve any burning or pain from frequent diarrhea episodes. SEEK IMMEDIATE MEDICAL CARE IF:   You are unable to keep fluids down.  You have persistent vomiting.  You have blood in your stool, or your stools are black and tarry.  You do not urinate in 6-8 hours, or there is only a small amount of very dark urine.  You have abdominal pain that increases or localizes.  You have weakness, dizziness, confusion, or light-headedness.  You have a severe headache.  Your diarrhea gets worse or does not get better.  You have a fever or persistent symptoms for more than 2-3 days.  You have a fever and your symptoms suddenly get worse. MAKE SURE YOU:   Understand these instructions.  Will watch your condition.  Will get help right away if you are not doing well or get worse.   This information is not intended to replace advice given to you by your health care provider. Make sure you discuss any questions you have with your health care provider.   Document Released: 08/07/2002 Document Revised: 09/07/2014 Document Reviewed: 04/24/2012 Elsevier Interactive Patient Education 2016 [place] DASH stands for "Dietary Approaches to Stop Hypertension." The  DASH eating plan is a healthy eating plan that has been shown to reduce high blood pressure (hypertension). Additional health benefits may include reducing the risk of type 2 diabetes mellitus, heart disease, and stroke. The DASH eating plan may also help  with weight loss. WHAT DO I NEED TO KNOW ABOUT THE DASH EATING PLAN? For the DASH eating plan, you will follow these general guidelines:  Choose foods with a percent daily value for sodium of less than 5% (as listed on the food label).  Use salt-free seasonings or herbs instead of table salt or sea salt.  Check with your health care provider or pharmacist before using salt substitutes.  Eat lower-sodium products, often labeled as "lower sodium" or "no salt added."  Eat fresh foods.  Eat more vegetables, fruits, and low-fat dairy products.  Choose whole grains. Look for the word "whole" as the first word in the ingredient list.  Choose fish and skinless chicken or [place] more often than red meat. Limit fish, poultry, and meat to 6 oz (170 g) each day.  Limit sweets, desserts, sugars, and sugary drinks.  Choose heart-healthy fats.  Limit cheese to 1 oz (28 g) per day.  Eat more home-cooked food and less restaurant, buffet, and fast food.  Limit fried foods.  Cook foods using methods other than frying.  Limit canned vegetables. If you do use them, rinse them well to decrease the sodium.  When eating at a restaurant, ask that your food be prepared with less salt, or no salt if possible. WHAT FOODS CAN I EAT? Seek help from a dietitian for individual calorie needs. Grains Whole grain or whole wheat bread. Brown rice. Whole grain or whole wheat pasta. Quinoa, bulgur, and whole grain cereals. Low-sodium cereals. Corn or whole wheat flour tortillas. Whole grain cornbread. Whole grain crackers. Low-sodium crackers. Vegetables Fresh or frozen vegetables (raw, steamed, roasted, or grilled). Low-sodium or reduced-sodium tomato and vegetable juices. Low-sodium or reduced-sodium tomato sauce and paste. Low-sodium or reduced-sodium canned vegetables.  Fruits All fresh, canned (in natural juice), or frozen fruits. Meat and Other Protein Products Ground beef (85% or leaner), grass-fed  beef, or beef trimmed of fat. Skinless chicken or [place]. Ground chicken or [place]. Pork trimmed of fat. All fish and seafood. Eggs. Dried beans, peas, or lentils. Unsalted nuts and seeds. Unsalted canned beans. Dairy Low-fat dairy products, such as skim or 1% milk, 2% or reduced-fat cheeses, low-fat ricotta or cottage cheese, or plain low-fat yogurt. Low-sodium or reduced-sodium cheeses. Fats and Oils Tub margarines without trans fats. Light or reduced-fat mayonnaise and salad dressings (reduced sodium). Avocado. Safflower, olive, or canola oils. Natural peanut or almond butter. Other Unsalted popcorn and pretzels. The items listed above may not be a complete list of recommended foods or beverages. Contact your dietitian for more options. WHAT FOODS ARE NOT RECOMMENDED? Grains White bread. White pasta. White rice. Refined cornbread. Bagels and croissants. Crackers that contain trans fat. Vegetables Creamed or fried vegetables. Vegetables in a cheese sauce. Regular canned vegetables. Regular canned tomato sauce and paste. Regular tomato and vegetable juices. Fruits Dried fruits. Canned fruit in light or heavy syrup. Fruit juice. Meat and Other Protein Products Fatty cuts of meat. Ribs, chicken wings, bacon, sausage, bologna, salami, chitterlings, fatback, hot dogs, bratwurst, and packaged luncheon meats. Salted nuts and seeds. Canned beans with salt. Dairy Whole or 2% milk, cream, half-and-half, and cream cheese. Whole-fat or sweetened yogurt. Full-fat cheeses or blue cheese. Nondairy creamers and whipped toppings. Processed cheese,  cheese spreads, or cheese curds. Condiments Onion and garlic salt, seasoned salt, table salt, and sea salt. Canned and packaged gravies. Worcestershire sauce. Tartar sauce. Barbecue sauce. Teriyaki sauce. Soy sauce, including reduced sodium. Steak sauce. Fish sauce. Oyster sauce. Cocktail sauce. Horseradish. Ketchup and mustard. Meat flavorings and tenderizers.  Bouillon cubes. Hot sauce. Tabasco sauce. Marinades. Taco seasonings. Relishes. Fats and Oils Butter, stick margarine, lard, shortening, ghee, and bacon fat. Coconut, palm kernel, or palm oils. Regular salad dressings. Other Pickles and olives. Salted popcorn and pretzels. The items listed above may not be a complete list of foods and beverages to avoid. Contact your dietitian for more information. WHERE CAN I FIND MORE INFORMATION? National Heart, Lung, and Blood Institute: travelstabloid.com   This information is not intended to replace advice given to you by your health care provider. Make sure you discuss any questions you have with your health care provider.   Document Released: 08/06/2011 Document Revised: 09/07/2014 Document Reviewed: 06/21/2013 Elsevier Interactive Patient Education [employer].

## 2016-04-20 NOTE — Assessment & Plan Note (Signed)
Encouraged DASH diet, decrease po intake and increase exercise as tolerated. Needs 7-8 hours of sleep nightly. Avoid trans fats, eat small, frequent meals every 4-5 hours with lean proteins, complex carbs and healthy fats. Minimize simple carbs, GMO foods. 

## 2016-04-29 NOTE — Assessment & Plan Note (Signed)
Encouraged moist heat and gentle stretching as tolerated. May try NSAIDs and prescription meds as directed and report if symptoms worsen or seek immediate care 

## 2016-04-29 NOTE — Progress Notes (Signed)
Patient ID: Tracy Miles, female   DOB: 07-11-1934, 82 y.o.   MRN: [id number]   Subjective:    Patient ID: Tracy Miles, female    DOB: 10-Dec-1933, 82 y.o.   MRN: [id number]  Chief Complaint  Patient presents with  . Follow-up    HPI Patient is in today for follow up. She is feeling well today. She denies any recent illness or acute concerns. No recent hospitalization. Denies CP/palp/SOB/HA/congestion/fevers/GI or GU c/o. Taking meds as prescribed  Past Medical History:  Diagnosis Date  . Abnormal results of thyroid function studies 12/17/2013  . Allergic state 11/19/2013  . Allergy   . Anemia   . Aortic stenosis    Very mild  . Arthritis   . Atrial fibrillation ([place])   . Benign paroxysmal positional vertigo 12/17/2013  . Chicken pox as a child  . Colon polyp   . Coronary artery disease   . DDD (degenerative disc disease) 11/16/2013  . Heart murmur   . Heme positive stool 11/16/2013  . History of valvular heart disease    LEFT  . Hypercholesteremia   . Hyperlipidemia   . Hypertension   . Iron deficiency anemia 11/13/2013  . Measles as a child  . Medicare annual wellness visit, subsequent 09/15/2015  . Mumps child and teenager  . OA (osteoarthritis) 11/19/2013   S/p L TKR  . Obesity   . Obstructive sleep apnea 02/22/2015  . OSA on CPAP   . PAF (paroxysmal atrial fibrillation) ([place])   . Pancreatitis    post hysterectomy  . Personal history of colonic polyps 02/25/2014  . Personal history of DVT (deep vein thrombosis) X 2   "left"  . Sleep apnea   . Spinal stenosis   . Unspecified constipation 11/16/2013  . Unspecified sleep apnea 11/19/2013  . Visit for monitoring Tikosyn therapy 08/11/2015    Past Surgical History:  Procedure Laterality Date  . APPENDECTOMY    . BACK SURGERY    . CARDIOVERSION N/A 08/09/2015   Procedure: CARDIOVERSION;  Surgeon: Jerline Pain, MD;  Location: [facility];  Service: Cardiovascular;  Laterality: N/A;  . CATARACT EXTRACTION W/  INTRAOCULAR LENS  IMPLANT, BILATERAL Bilateral 2016  . CYST REMOVAL HAND Bilateral 1990's   "played to much golf"  . DILATION AND CURETTAGE OF UTERUS    . EYE SURGERY  2016   cataracts  . JOINT REPLACEMENT    . LUMBAR LAMINECTOMY  40 yrs ago   "L3-4"  . MENISCUS REPAIR Bilateral 2000 and 2010  . REPLACEMENT TOTAL KNEE Left 2013  . TONSILECTOMY, ADENOIDECTOMY, BILATERAL MYRINGOTOMY AND TUBES  child  . TONSILLECTOMY AND ADENOIDECTOMY    . TOTAL ABDOMINAL HYSTERECTOMY  1995   had 2 tumors- benign  . TUBAL LIGATION  82 years old    Family History  Problem Relation Age of Onset  . Heart attack Mother 50  . Hyperlipidemia Mother     ?  Marland Kitchen Dementia Mother   . Pernicious anemia Mother   . COPD Father     smoker  . Cancer Father 68    prostate  . Heart disease Brother     quadruple bipass surgery  . Hyperlipidemia Brother   . Hypertension Brother   . Diabetes Maternal Grandmother     type 2  . Pernicious anemia Maternal Grandmother   . Colon cancer Neg Hx   . Pancreatic cancer Neg Hx   . Stomach cancer Neg Hx   . Throat cancer Neg Hx   .  Liver disease Neg Hx   . Gout Son   . Atrial fibrillation Son   . Hyperlipidemia Son   . Cancer Maternal Grandfather     liver  . Cancer Paternal Grandmother     lung- doesn't think she smokes?  . Stroke Paternal Grandfather   . Cancer Son 50    non hodgin's lymphoma  . Gout Son   . Hyperlipidemia Son   . Sleep apnea Son     Social History   Social History  . Marital status: Widowed    Spouse name: N/A  . Number of children: 3  . Years of education: N/A   Occupational History  . retired Pharmacist, hospital    Social History Main Topics  . Smoking status: Never Smoker  . Smokeless tobacco: Never Used     Comment: never used tobacco  . Alcohol use No  . Drug use: No  . Sexual activity: No     Comment: lives at [place], low sodium diet   Other Topics Concern  . Not on file   Social History Narrative  . No narrative on file     Outpatient Medications Prior to Visit  Medication Sig Dispense Refill  . acetaminophen (TYLENOL) 650 MG CR tablet Take 1,300 mg by mouth every evening.    Marland Kitchen apixaban (ELIQUIS) 5 MG TABS tablet Take 1 tablet (5 mg total) by mouth 2 (two) times daily. 180 tablet 2  . calcium carbonate (OS-CAL) 600 MG TABS tablet Take 600 mg by mouth 2 (two) times daily with a meal.    . diltiazem (CARDIZEM CD) 240 MG 24 hr capsule Take 240 mg by mouth daily.    Marland Kitchen dofetilide (TIKOSYN) 250 MCG capsule Take 1 capsule (250 mcg total) by mouth 2 (two) times daily. 60 capsule 3  . Lactobacillus (PROBIOTIC ACIDOPHILUS) TABS Take 1 tablet by mouth daily. Reported on 03/04/2016    . loratadine (CLARITIN) 10 MG tablet Take 10 mg by mouth daily.     . psyllium (REGULOID) 0.52 g capsule Take 0.52 g by mouth daily.    Marland Kitchen atorvastatin (LIPITOR) 20 MG tablet TAKE ONE (1) TABLET BY MOUTH EVERY DAY 90 tablet 0  . bumetanide (BUMEX) 1 MG tablet TAKE ONE (1) TABLET BY MOUTH EVERY DAY 90 tablet 0  . lisinopril (PRINIVIL,ZESTRIL) 10 MG tablet TAKE ONE (1) TABLET BY MOUTH EVERY DAY 90 tablet 0  . potassium chloride (KLOR-CON 10) 10 MEQ tablet TAKE ONE (2) TABLETS BY MOUTH EACH DAY (Patient taking differently: Take 10 mEq by mouth 3 (three) times daily. TAKE ONE (2) TABLETS BY MOUTH EACH DAY) 60 tablet 3   No facility-administered medications prior to visit.      Review of Systems  Constitutional: Negative for fever and malaise/fatigue.  HENT: Negative for congestion.   Eyes: Negative for blurred vision.  Respiratory: Negative for shortness of breath.   Cardiovascular: Negative for chest pain, palpitations and leg swelling.  Gastrointestinal: Negative for abdominal pain, blood in stool and nausea.  Genitourinary: Negative for dysuria and frequency.  Musculoskeletal: Positive for back pain and joint pain. Negative for falls.  Skin: Negative for rash.  Neurological: Negative for dizziness, loss of consciousness and headaches.    Endo/Heme/Allergies: Negative for environmental allergies.  Psychiatric/Behavioral: Negative for depression. The patient is not nervous/anxious.        Objective:    Physical Exam  Constitutional: She is oriented to person, place, and time. She appears well-developed and well-nourished. No distress.  HENT:  Head: Normocephalic and atraumatic.  Nose: Nose normal.  Eyes: Right eye exhibits no discharge. Left eye exhibits no discharge.  Neck: Normal range of motion. Neck supple.  Cardiovascular: Normal rate.   Pulmonary/Chest: Effort normal and breath sounds normal.  Abdominal: Soft. Bowel sounds are normal. There is no tenderness.  Musculoskeletal: She exhibits no edema.  Neurological: She is alert and oriented to person, place, and time.  Skin: Skin is warm and dry.  Psychiatric: She has a normal mood and affect.  Nursing note and vitals reviewed.   BP 120/78 (BP Location: Left Arm, Patient Position: Sitting, Cuff Size: Large)   Pulse 81   Temp 98.2 F (36.8 C) (Oral)   Ht 5\' 3"  (1.6 m)   Wt 263 lb 8 oz (119.5 kg)   SpO2 95%   BMI 46.68 kg/m  Wt Readings from Last 3 Encounters:  04/20/16 263 lb 8 oz (119.5 kg)  03/04/16 260 lb 12.8 oz (118.3 kg)  12/30/15 260 lb 9.6 oz (118.2 kg)     Lab Results  Component Value Date   WBC 6.7 04/20/2016   HGB 13.3 04/20/2016   HCT 38.9 04/20/2016   PLT 229.0 04/20/2016   GLUCOSE 99 04/03/2016   CHOL 166 04/20/2016   TRIG 165.0 (H) 04/20/2016   HDL 48.10 04/20/2016   LDLCALC 85 04/20/2016   ALT 10 04/03/2016   AST 14 04/03/2016   NA 138 04/03/2016   K 4.4 04/03/2016   CL 102 04/03/2016   CREATININE 0.97 04/03/2016   BUN 22 04/03/2016   CO2 28 04/03/2016   TSH 3.13 04/20/2016   INR 1.55 (H) 08/06/2015    Lab Results  Component Value Date   TSH 3.13 04/20/2016   Lab Results  Component Value Date   WBC 6.7 04/20/2016   HGB 13.3 04/20/2016   HCT 38.9 04/20/2016   MCV 93.3 04/20/2016   PLT 229.0 04/20/2016   Lab  Results  Component Value Date   NA 138 04/03/2016   K 4.4 04/03/2016   CO2 28 04/03/2016   GLUCOSE 99 04/03/2016   BUN 22 04/03/2016   CREATININE 0.97 04/03/2016   BILITOT 0.6 04/03/2016   ALKPHOS 76 04/03/2016   AST 14 04/03/2016   ALT 10 04/03/2016   PROT 7.0 04/03/2016   ALBUMIN 4.3 04/03/2016   CALCIUM 9.6 04/03/2016   ANIONGAP 8 03/04/2016   GFR 58.73 (L) 04/03/2016   Lab Results  Component Value Date   CHOL 166 04/20/2016   Lab Results  Component Value Date   HDL 48.10 04/20/2016   Lab Results  Component Value Date   LDLCALC 85 04/20/2016   Lab Results  Component Value Date   TRIG 165.0 (H) 04/20/2016   Lab Results  Component Value Date   CHOLHDL 3 04/20/2016   No results found for: HGBA1C     Assessment & Plan:   Problem List Items Addressed This Visit    Atrial fibrillation ([place])    TOLERATING eLIQUIS      Relevant Medications   bumetanide (BUMEX) 1 MG tablet   atorvastatin (LIPITOR) 20 MG tablet   lisinopril (PRINIVIL,ZESTRIL) 10 MG tablet   Essential hypertension    Well controlled, no changes to meds. Encouraged heart healthy diet such as the DASH diet and exercise as tolerated.       Relevant Medications   bumetanide (BUMEX) 1 MG tablet   atorvastatin (LIPITOR) 20 MG tablet   lisinopril (PRINIVIL,ZESTRIL) 10 MG tablet   Other Relevant Orders  CBC (Completed)   TSH (Completed)   Hyperlipidemia, mixed    Encouraged heart healthy diet, increase exercise, avoid trans fats, consider a krill oil cap daily      Relevant Medications   bumetanide (BUMEX) 1 MG tablet   atorvastatin (LIPITOR) 20 MG tablet   lisinopril (PRINIVIL,ZESTRIL) 10 MG tablet   Other Relevant Orders   Lipid panel (Completed)   Obesity    Encouraged DASH diet, decrease po intake and increase exercise as tolerated. Needs 7-8 hours of sleep nightly. Avoid trans fats, eat small, frequent meals every 4-5 hours with lean proteins, complex carbs and healthy fats. Minimize  simple carbs, GMO foods.      Obstructive sleep apnea    Wears machine nightly and at naps.        Other Visit Diagnoses   None.     I have changed Ms. Jago's bumetanide, atorvastatin, and lisinopril. I am also having her maintain her calcium carbonate, loratadine, PROBIOTIC ACIDOPHILUS, acetaminophen, apixaban, dofetilide, diltiazem, and psyllium.  Meds ordered this encounter  Medications  . bumetanide (BUMEX) 1 MG tablet    Sig: Take 1 tablet (1 mg total) by mouth daily.    Dispense:  90 tablet    Refill:  1  . atorvastatin (LIPITOR) 20 MG tablet    Sig: Take 1 tablet (20 mg total) by mouth daily at 6 PM.    Dispense:  90 tablet    Refill:  1  . lisinopril (PRINIVIL,ZESTRIL) 10 MG tablet    Sig: Take 1 tablet (10 mg total) by mouth daily.    Dispense:  90 tablet    Refill:  1     [name], MD

## 2016-04-29 NOTE — Assessment & Plan Note (Signed)
TOLERATING eLIQUIS

## 2016-04-30 ENCOUNTER — Encounter: Payer: Self-pay | Admitting: Pulmonary Disease

## 2016-04-30 ENCOUNTER — Ambulatory Visit (INDEPENDENT_AMBULATORY_CARE_PROVIDER_SITE_OTHER): Payer: Medicare Other | Admitting: Pulmonary Disease

## 2016-04-30 DIAGNOSIS — Z23 Encounter for immunization: Secondary | ICD-10-CM

## 2016-04-30 DIAGNOSIS — I5032 Chronic diastolic (congestive) heart failure: Secondary | ICD-10-CM | POA: Diagnosis not present

## 2016-04-30 DIAGNOSIS — G4733 Obstructive sleep apnea (adult) (pediatric): Secondary | ICD-10-CM

## 2016-04-30 NOTE — Progress Notes (Signed)
   Subjective:    Patient ID: Tracy Miles, female    DOB: 02-20-1934, 82 y.o.   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 Sleep Apnea    Wears CPAP every night approx 7-8 hours nightly.  No concerns.   She had a 10 day hospitalization for atrial fibrillation, on tikosyn We were able to obtain a new BiPAP for her in 2016 she is very compliant by report, uses nasal pillows,  Download  shows excellent compliance , avg usage 7.5hr /day. , No residuals On BIPAP 16/12 , min leaks .  Weight is unchanged   Significant tests/ events  PSG 03/07/15 >had trouble sleeping due to back pain (total sleep time was only 243 min ) AHI 10.4./hr  (AHI during REM 74.7 /hr )  Low sat 84%. Severe PLMS.   Review of Systems Patient denies significant dyspnea,cough, hemoptysis,  chest pain, palpitations, pedal edema, orthopnea, paroxysmal nocturnal dyspnea, lightheadedness, nausea, vomiting, abdominal or  leg pains      Objective:   Physical Exam  Gen. Pleasant, obese, in no distress ENT - no lesions, no post nasal drip Neck: No JVD, no thyromegaly, no carotid bruits Lungs: no use of accessory muscles, no dullness to percussion, decreased without rales or rhonchi  Cardiovascular: Rhythm regular, heart sounds  normal, no murmurs or gallops, no peripheral edema Musculoskeletal: No deformities, no cyanosis or clubbing , no tremors        Assessment & Plan:

## 2016-04-30 NOTE — Assessment & Plan Note (Signed)
BiPAP machine is working well Supplies will be renewed for a year  Weight loss encouraged, compliance with goal of at least 4-6 hrs every night is the expectation. Advised against medications with sedative side effects Cautioned against driving when sleepy - understanding that sleepiness will vary on a day to day basis

## 2016-04-30 NOTE — Assessment & Plan Note (Signed)
Benefits of CPAP therapy on CHF and atrial fibrillation discussed

## 2016-04-30 NOTE — Patient Instructions (Signed)
BiPAP machine is working well Supplies will be renewed for a year

## 2016-05-07 ENCOUNTER — Other Ambulatory Visit (HOSPITAL_COMMUNITY): Payer: Self-pay | Admitting: *Deleted

## 2016-05-07 MED ORDER — DILTIAZEM HCL ER COATED BEADS 120 MG PO CP24
120.0000 mg | ORAL_CAPSULE | Freq: Every day | ORAL | 6 refills | Status: DC
Start: 2016-05-07 — End: 2016-12-11

## 2016-05-11 ENCOUNTER — Ambulatory Visit (HOSPITAL_BASED_OUTPATIENT_CLINIC_OR_DEPARTMENT_OTHER)
Admission: RE | Admit: 2016-05-11 | Discharge: 2016-05-11 | Disposition: A | Discharge status: Home / Self Care | Payer: Medicare Other | Source: Ambulatory Visit | Attending: Family Medicine | Admitting: Family Medicine

## 2016-05-11 DIAGNOSIS — Z1231 Encounter for screening mammogram for malignant neoplasm of breast: Secondary | ICD-10-CM | POA: Insufficient documentation

## 2016-05-18 ENCOUNTER — Encounter: Payer: Self-pay | Admitting: Pulmonary Disease

## 2016-05-19 ENCOUNTER — Other Ambulatory Visit: Payer: Self-pay | Admitting: Family Medicine

## 2016-06-05 ENCOUNTER — Other Ambulatory Visit: Payer: Self-pay | Admitting: Family Medicine

## 2016-06-05 DIAGNOSIS — I10 Essential (primary) hypertension: Secondary | ICD-10-CM

## 2016-06-05 DIAGNOSIS — E785 Hyperlipidemia, unspecified: Secondary | ICD-10-CM

## 2016-06-05 DIAGNOSIS — I4891 Unspecified atrial fibrillation: Secondary | ICD-10-CM

## 2016-06-10 DIAGNOSIS — H524 Presbyopia: Secondary | ICD-10-CM | POA: Diagnosis not present

## 2016-06-10 DIAGNOSIS — H26493 Other secondary cataract, bilateral: Secondary | ICD-10-CM | POA: Diagnosis not present

## 2016-06-17 ENCOUNTER — Ambulatory Visit (HOSPITAL_COMMUNITY)
Admission: RE | Admit: 2016-06-17 | Discharge: 2016-06-17 | Disposition: A | Discharge status: Home / Self Care | Payer: Medicare Other | Source: Ambulatory Visit | Attending: Nurse Practitioner | Admitting: Nurse Practitioner

## 2016-06-17 VITALS — BP 120/86 | HR 73 | Ht 63.0 in | Wt 268.0 lb

## 2016-06-17 DIAGNOSIS — M1990 Unspecified osteoarthritis, unspecified site: Secondary | ICD-10-CM | POA: Diagnosis not present

## 2016-06-17 DIAGNOSIS — H8110 Benign paroxysmal vertigo, unspecified ear: Secondary | ICD-10-CM | POA: Insufficient documentation

## 2016-06-17 DIAGNOSIS — Z7901 Long term (current) use of anticoagulants: Secondary | ICD-10-CM | POA: Diagnosis not present

## 2016-06-17 DIAGNOSIS — Z79899 Other long term (current) drug therapy: Secondary | ICD-10-CM | POA: Insufficient documentation

## 2016-06-17 DIAGNOSIS — D509 Iron deficiency anemia, unspecified: Secondary | ICD-10-CM | POA: Insufficient documentation

## 2016-06-17 DIAGNOSIS — Z9889 Other specified postprocedural states: Secondary | ICD-10-CM | POA: Diagnosis not present

## 2016-06-17 DIAGNOSIS — E7800 Pure hypercholesterolemia, unspecified: Secondary | ICD-10-CM | POA: Diagnosis not present

## 2016-06-17 DIAGNOSIS — Z880 Allergy status to penicillin: Secondary | ICD-10-CM | POA: Diagnosis not present

## 2016-06-17 DIAGNOSIS — Z86718 Personal history of other venous thrombosis and embolism: Secondary | ICD-10-CM | POA: Insufficient documentation

## 2016-06-17 DIAGNOSIS — Z791 Long term (current) use of non-steroidal anti-inflammatories (NSAID): Secondary | ICD-10-CM | POA: Insufficient documentation

## 2016-06-17 DIAGNOSIS — G4733 Obstructive sleep apnea (adult) (pediatric): Secondary | ICD-10-CM | POA: Diagnosis not present

## 2016-06-17 DIAGNOSIS — E669 Obesity, unspecified: Secondary | ICD-10-CM | POA: Insufficient documentation

## 2016-06-17 DIAGNOSIS — I10 Essential (primary) hypertension: Secondary | ICD-10-CM | POA: Diagnosis not present

## 2016-06-17 DIAGNOSIS — I2510 Atherosclerotic heart disease of native coronary artery without angina pectoris: Secondary | ICD-10-CM | POA: Insufficient documentation

## 2016-06-17 DIAGNOSIS — Z888 Allergy status to other drugs, medicaments and biological substances status: Secondary | ICD-10-CM | POA: Insufficient documentation

## 2016-06-17 DIAGNOSIS — I480 Paroxysmal atrial fibrillation: Secondary | ICD-10-CM | POA: Insufficient documentation

## 2016-06-17 LAB — BASIC METABOLIC PANEL
ANION GAP: 7 (ref 5–15)
BUN: 17 mg/dL (ref 6–20)
CO2: 31 mmol/L (ref 22–32)
Calcium: 9.7 mg/dL (ref 8.9–10.3)
Chloride: 103 mmol/L (ref 101–111)
Creatinine, Ser: 0.95 mg/dL (ref 0.44–1.00)
GFR, EST NON AFRICAN AMERICAN: 55 mL/min — AB (ref >60)
GLUCOSE: 106 mg/dL — AB (ref 65–99)
POTASSIUM: 4.1 mmol/L (ref 3.5–5.1)
Sodium: 141 mmol/L (ref 135–145)

## 2016-06-17 LAB — MAGNESIUM: Magnesium: 2.2 mg/dL (ref 1.7–2.4)

## 2016-06-17 NOTE — Progress Notes (Signed)
Patient ID: Tracy Miles, female   DOB: 1934/03/17, 82 y.o.   MRN: [id number]     Primary Care Physician: [name], MD Referring Physician: Dr. Lillette Boxer Tracy Miles is a 82 y.o. female with a h/o  PAF on tikosyn for f/u. She returns today and has not noticed any afib. She continue on eliquis with a chadsvasc score of at least 5. No bleeding issues. Has some occasional positional dizziness worse when it is hot.   Returns to afib clinic and reports no issues with afib. She is having issues with aching, general arthritis and has been taking aleve and has noticed fluid retention. Discussed that daily NSAIDS may contribute to GI bleeding and asked not to take daily aleve. Advised may need to talk to PCP about alternatives for pain control. She is being compliant with tikosyn and eliquis. No bleeding issues.  Today, she denies symptoms of palpitations, chest pain, shortness of breath, orthopnea, PND, lower extremity edema, dizziness, presyncope, syncope, or neurologic sequela. The patient is tolerating medications without difficulties and is otherwise without complaint today.   Past Medical History  Diagnosis Date  . Hypertension   . Heart murmur   . Coronary artery disease   . Atrial fibrillation ([place])   . Anemia   . Arthritis   . Colon polyp   . Personal history of DVT (deep vein thrombosis) X 2    "left"  . Hyperlipidemia   . Pancreatitis     post hysterectomy  . Sleep apnea   . Aortic stenosis     Very mild  . Hypercholesteremia   . OSA on CPAP   . PAF (paroxysmal atrial fibrillation) ([place])   . History of valvular heart disease     LEFT  . Chicken pox as a child  . Measles as a child  . Mumps child and teenager  . Allergy   . Obesity   . Unspecified constipation 11/16/2013  . Heme positive stool 11/16/2013  . DDD (degenerative disc disease) 11/16/2013  . Unspecified sleep apnea 11/19/2013  . Allergic state 11/19/2013  . OA (osteoarthritis) 11/19/2013    S/p L TKR    . Benign paroxysmal positional vertigo 12/17/2013  . Abnormal results of thyroid function studies 12/17/2013  . Iron deficiency anemia 11/13/2013  . Personal history of colonic polyps 02/25/2014  . Spinal stenosis   . Obstructive sleep apnea 02/22/2015  . Visit for monitoring Tikosyn therapy 08/11/2015  . Medicare annual wellness visit, subsequent 09/15/2015   Past Surgical History  Procedure Laterality Date  . Tonsilectomy, adenoidectomy, bilateral myringotomy and tubes  child  . Lumbar laminectomy  40 yrs ago    "L3-4"  . Tubal ligation  82 years old  . Total abdominal hysterectomy  1995    had 2 tumors- benign  . Replacement total knee Left 2013  . Cyst removal hand Bilateral 1990's    "played to much golf"  . Meniscus repair Bilateral 2000 and 2010  . Appendectomy    . Tonsillectomy and adenoidectomy    . Joint replacement    . Back surgery    . Cataract extraction w/ intraocular lens  implant, bilateral Bilateral 2016  . Dilation and curettage of uterus    . Cardioversion N/A 08/09/2015    Procedure: CARDIOVERSION;  Surgeon: Jerline Pain, MD;  Location: [facility];  Service: Cardiovascular;  Laterality: N/A;  . Eye surgery  2016    cataracts    Current Outpatient Prescriptions  Medication Sig Dispense  Refill  . acetaminophen (TYLENOL) 650 MG CR tablet Take 1,300 mg by mouth every evening.    Marland Kitchen apixaban (ELIQUIS) 5 MG TABS tablet Take 1 tablet (5 mg total) by mouth 2 (two) times daily. 180 tablet 2  . atorvastatin (LIPITOR) 20 MG tablet Take 1 tablet (20 mg total) by mouth daily. 90 tablet 2  . bumetanide (BUMEX) 1 MG tablet TAKE ONE (1) TABLET BY MOUTH EVERY DAY 90 tablet 0  . calcium carbonate (OS-CAL) 600 MG TABS tablet Take 600 mg by mouth 2 (two) times daily with a meal.    . diltiazem (CARDIZEM CD) 240 MG 24 hr capsule Take 240 mg by mouth daily.    Marland Kitchen dofetilide (TIKOSYN) 250 MCG capsule Take 1 capsule (250 mcg total) by mouth 2 (two) times daily. 60 capsule 3  .  Lactobacillus (PROBIOTIC ACIDOPHILUS) TABS Take 1 tablet by mouth daily. Reported on 03/04/2016    . lisinopril (PRINIVIL,ZESTRIL) 10 MG tablet Take 1 tablet (10 mg total) by mouth daily. 90 tablet 2  . loratadine (CLARITIN) 10 MG tablet Take 10 mg by mouth daily.     . potassium chloride (KLOR-CON 10) 10 MEQ tablet TAKE ONE (1) TABLET EACH DAY 90 tablet 2  . psyllium (REGULOID) 0.52 g capsule Take 0.52 g by mouth daily.     No current facility-administered medications for this encounter.    Allergies  Allergen Reactions  . Gabapentin Swelling  . Lactose Intolerance (Gi) Other (See Comments)    Bothers her stomach  . Zebeta [Bisoprolol Fumarate] Nausea Only  . Penicillins Rash    Social History   Social History  . Marital Status: Widowed    Spouse Name: N/A  . Number of Children: 3  . Years of Education: N/A   Occupational History  . retired Pharmacist, hospital    Social History Main Topics  . Smoking status: Never Smoker   . Smokeless tobacco: Never Used     Comment: never used tobacco  . Alcohol Use: No  . Drug Use: No  . Sexual Activity: No     Comment: lives at [place], low sodium diet   Other Topics Concern  . Not on file   Social History Narrative    Family History  Problem Relation Age of Onset  . Heart attack Mother 30  . Hyperlipidemia Mother     ?  Marland Kitchen Dementia Mother   . Pernicious anemia Mother   . COPD Father     smoker  . Cancer Father 75    prostate  . Heart disease Brother     quadruple bipass surgery  . Hyperlipidemia Brother   . Hypertension Brother   . Diabetes Maternal Grandmother     type 2  . Pernicious anemia Maternal Grandmother   . Colon cancer Neg Hx   . Pancreatic cancer Neg Hx   . Stomach cancer Neg Hx   . Throat cancer Neg Hx   . Liver disease Neg Hx   . Gout Son   . Atrial fibrillation Son   . Hyperlipidemia Son   . Cancer Maternal Grandfather     liver  . Cancer Paternal Grandmother     lung- doesn't think she smokes?  .  Stroke Paternal Grandfather   . Cancer Son 50    non hodgin's lymphoma  . Gout Son   . Hyperlipidemia Son   . Sleep apnea Son     ROS- All systems are reviewed and negative except as per the  HPI above  Physical Exam: Filed Vitals:   03/04/16 1351  BP: 138/74  Pulse: 75  Height: 5' 3.5" (1.613 m)  Weight: 260 lb 12.8 oz (118.298 kg)    GEN- The patient is well appearing, alert and oriented x 3 today.   Head- normocephalic, atraumatic Eyes-  Sclera clear, conjunctiva pink Ears- hearing intact Oropharynx- clear Neck- supple, no JVP Lymph- no cervical lymphadenopathy Lungs- Clear to ausculation bilaterally, normal work of breathing Heart- Regular rate and rhythm, no murmurs, rubs or gallops, PMI not laterally displaced GI- soft, NT, ND, + BS Extremities- no clubbing, cyanosis, or edema MS- no significant deformity or atrophy Skin- no rash or lesion Psych- euthymic mood, full affect Neuro- strength and sensation are intact  EKG- NSR at 73 bpm, Pr int 176 ms, QRS int 74 ms, Qtc 449 ms Epic records reviewed   Assessment and Plan: 1. Afib Doing well on tikosyn, continue 250 mg bid Continue apixaban Bmet, mag today  2. Recent use of daily NSAIDS for arthritic pain Asked not to take on a daily basis for fear of increased bleeding risk She will talk to pcp for alternatives Feels as thought she has retained about 5 lbs of fluid since taking daily NSAIDS She can take 1 extra mg of Bumex tomorrow to help diuresis  F/u in 3 months  Butch Penny C. Tracy Miles, [facility] [address] [place], [place] [address] [phone]

## 2016-07-07 ENCOUNTER — Encounter: Payer: Self-pay | Admitting: Physician Assistant

## 2016-07-07 ENCOUNTER — Ambulatory Visit (INDEPENDENT_AMBULATORY_CARE_PROVIDER_SITE_OTHER): Payer: Medicare Other | Admitting: Physician Assistant

## 2016-07-07 VITALS — BP 108/70 | HR 82 | Temp 98.2°F | Resp 16 | Ht 63.0 in | Wt 266.0 lb

## 2016-07-07 DIAGNOSIS — L568 Other specified acute skin changes due to ultraviolet radiation: Secondary | ICD-10-CM | POA: Diagnosis not present

## 2016-07-07 MED ORDER — TRIAMCINOLONE ACETONIDE 0.1 % EX CREA: 1.0000 "application " | TOPICAL_CREAM | Freq: Two times a day (BID) | CUTANEOUS | 0 refills | Status: DC

## 2016-07-07 NOTE — Progress Notes (Signed)
Patient presents to clinic today c/o 1 weeks of a pruritic and blistering scattered rash of dorsal hands and forearms bilaterally, as well as neck and lower face. Endorses rash began after being in direct sunlight for a prolonged period of time. Endorses rash is only on uncovered areas. Denies fever, chills, malaise or fatiggue  Past Medical History:  Diagnosis Date  . Abnormal results of thyroid function studies 12/17/2013  . Allergic state 11/19/2013  . Allergy   . Anemia   . Aortic stenosis    Very mild  . Arthritis   . Atrial fibrillation ([place])   . Benign paroxysmal positional vertigo 12/17/2013  . Chicken pox as a child  . Colon polyp   . Coronary artery disease   . DDD (degenerative disc disease) 11/16/2013  . Heart murmur   . Heme positive stool 11/16/2013  . History of valvular heart disease    LEFT  . Hypercholesteremia   . Hyperlipidemia   . Hypertension   . Iron deficiency anemia 11/13/2013  . Measles as a child  . Medicare annual wellness visit, subsequent 09/15/2015  . Mumps child and teenager  . OA (osteoarthritis) 11/19/2013   S/p L TKR  . Obesity   . Obstructive sleep apnea 02/22/2015  . OSA on CPAP   . PAF (paroxysmal atrial fibrillation) ([place])   . Pancreatitis    post hysterectomy  . Personal history of colonic polyps 02/25/2014  . Personal history of DVT (deep vein thrombosis) X 2   "left"  . Sleep apnea   . Spinal stenosis   . Unspecified constipation 11/16/2013  . Unspecified sleep apnea 11/19/2013  . Visit for monitoring Tikosyn therapy 08/11/2015    Current Outpatient Prescriptions on File Prior to Visit  Medication Sig Dispense Refill  . atorvastatin (LIPITOR) 20 MG tablet Take 1 tablet (20 mg total) by mouth daily at 6 PM. 90 tablet 1  . bumetanide (BUMEX) 1 MG tablet Take 1 tablet (1 mg total) by mouth daily. 90 tablet 1  . calcium carbonate (OS-CAL) 600 MG TABS tablet Take 600 mg by mouth 2 (two) times daily with a meal.    . diltiazem (CARDIZEM  CD) 120 MG 24 hr capsule Take 1 capsule (120 mg total) by mouth daily. 30 capsule 6  . dofetilide (TIKOSYN) 250 MCG capsule Take 1 capsule (250 mcg total) by mouth 2 (two) times daily. 60 capsule 3  . ELIQUIS 5 MG TABS tablet TAKE ONE (1) TABLET BY MOUTH TWO (2) TIMES DAILY 180 tablet 0  . Lactobacillus (PROBIOTIC ACIDOPHILUS) TABS Take 1 tablet by mouth daily. Reported on 03/04/2016    . lisinopril (PRINIVIL,ZESTRIL) 10 MG tablet Take 1 tablet (10 mg total) by mouth daily. 90 tablet 1  . loratadine (CLARITIN) 10 MG tablet Take 10 mg by mouth daily.     . potassium chloride (KLOR-CON 10) 10 MEQ tablet Take one tablet in the morning and two tablets in the pm 90 tablet 3  . psyllium (REGULOID) 0.52 g capsule Take 0.52 g by mouth daily.     No current facility-administered medications on file prior to visit.     Allergies  Allergen Reactions  . Gabapentin Swelling  . Lactose Intolerance (Gi) Other (See Comments)    Bothers her stomach  . Zebeta [Bisoprolol Fumarate] Nausea Only  . Penicillins Rash    Has patient had a PCN reaction causing immediate rash, facial/tongue/throat swelling, SOB or l} If all of the above answers are "NO", then may  proceed with Cephalosporin use.  . Sulfa Antibiotics Rash    Family History  Problem Relation Age of Onset  . Heart attack Mother 62  . Hyperlipidemia Mother     ?  Marland Kitchen Dementia Mother   . Pernicious anemia Mother   . COPD Father     smoker  . Cancer Father 71    prostate  . Heart disease Brother     quadruple bipass surgery  . Hyperlipidemia Brother   . Hypertension Brother   . Diabetes Maternal Grandmother     type 2  . Pernicious anemia Maternal Grandmother   . Colon cancer Neg Hx   . Pancreatic cancer Neg Hx   . Stomach cancer Neg Hx   . Throat cancer Neg Hx   . Liver disease Neg Hx   . Gout Son   . Atrial fibrillation Son   . Hyperlipidemia Son   . Cancer Maternal Grandfather     liver  . Cancer Paternal Grandmother     lung-  doesn't think she smokes?  . Stroke Paternal Grandfather   . Cancer Son 50    non hodgin's lymphoma  . Gout Son   . Hyperlipidemia Son   . Sleep apnea Son     Social History   Social History  . Marital status: Widowed    Spouse name: N/A  . Number of children: 3  . Years of education: N/A   Occupational History  . retired Pharmacist, hospital    Social History Main Topics  . Smoking status: Never Smoker  . Smokeless tobacco: Never Used     Comment: never used tobacco  . Alcohol use No  . Drug use: No  . Sexual activity: No     Comment: lives at [place], low sodium diet   Other Topics Concern  . None   Social History Narrative  . None    Review of Systems - See HPI.  All other ROS are negative.  Ht 5' 3"  (1.6 m)   Wt 266 lb (120.7 kg)   BMI 47.12 kg/m   Physical Exam  Constitutional: She is well-developed, well-nourished, and in no distress.  HENT:  Head: Normocephalic and atraumatic.  Eyes: Conjunctivae are normal.  Neck: Neck supple.  Cardiovascular: Normal rate, regular rhythm, normal heart sounds and intact distal pulses.   Pulmonary/Chest: Effort normal.  Skin: Skin is warm and dry.  Papulovesicular scattered rash of dorsal hands and forearms bilaterally. Rash ceases at sleeve line. Scattered papules of neck and upper chest noted. No rash noted elsewhere.  Vitals reviewed.   Recent Results (from the past 2160 hour(s))  CBC     Status: None   Collection Time: 04/20/16  8:18 AM  Result Value Ref Range   WBC 6.7 4.0 - 10.5 K/uL   RBC 4.17 3.87 - 5.11 Mil/uL   Platelets 229.0 150.0 - 400.0 K/uL   Hemoglobin 13.3 12.0 - 15.0 g/dL   HCT 38.9 36.0 - 46.0 %   MCV 93.3 78.0 - 100.0 fl   MCHC 34.1 30.0 - 36.0 g/dL   RDW 12.7 11.5 - 15.5 %  TSH     Status: None   Collection Time: 04/20/16  8:18 AM  Result Value Ref Range   TSH 3.13 0.35 - 4.50 uIU/mL  Lipid panel     Status: Abnormal   Collection Time: 04/20/16  8:18 AM  Result Value Ref Range    Cholesterol 166 0 - 200 mg/dL    Comment: ATP  III Classification       Desirable:  < 200 mg/dL               Borderline High:  200 - 239 mg/dL          High:  > = 240 mg/dL   Triglycerides 165.0 (H) 0.0 - 149.0 mg/dL    Comment: Normal:  <150 mg/dLBorderline High:  150 - 199 mg/dL   HDL 48.10 >39.00 mg/dL   VLDL 33.0 0.0 - 40.0 mg/dL   LDL Cholesterol 85 0 - 99 mg/dL   Total CHOL/HDL Ratio 3     Comment:                Men          Women1/2 Average Risk     3.4          3.3Average Risk          5.0          4.42X Average Risk          9.6          7.13X Average Risk          15.0          11.0                       NonHDL 118.12     Comment: NOTE:  Non-HDL goal should be 30 mg/dL higher than patient's LDL goal (i.e. LDL goal of < 70 mg/dL, would have non-HDL goal of < 100 mg/dL)  Basic metabolic panel     Status: Abnormal   Collection Time: 06/17/16  2:13 PM  Result Value Ref Range   Sodium 141 135 - 145 mmol/L   Potassium 4.1 3.5 - 5.1 mmol/L   Chloride 103 101 - 111 mmol/L   CO2 31 22 - 32 mmol/L   Glucose, Bld 106 (H) 65 - 99 mg/dL   BUN 17 6 - 20 mg/dL   Creatinine, Ser 0.95 0.44 - 1.00 mg/dL   Calcium 9.7 8.9 - 10.3 mg/dL   GFR calc non Af Amer 55 (L) >60 mL/min   GFR calc Af Amer >60 >60 mL/min    Comment: (NOTE) The eGFR has been calculated using the CKD EPI equation. This calculation has not been validated in all clinical situations. eGFR's persistently <60 mL/min signify possible Chronic Kidney Disease.    Anion gap 7 5 - 15  Magnesium     Status: None   Collection Time: 06/17/16  2:13 PM  Result Value Ref Range   Magnesium 2.2 1.7 - 2.4 mg/dL    Assessment/Plan: 1. Photodermatitis Suspect secondary to NSAID and other chronic medications. Rash only on sun exposed areas. Trying to avoid systemic steroids per patient preference. Will begin Kenalog BID to affected areas. Supportive measures discussed. FU if not resolving as we will need a short course of oral steroid. -  triamcinolone cream (KENALOG) 0.1 %; Apply 1 application topically 2 (two) times daily.  Dispense: 30 g; Refill: 0   Leeanne Rio, [place]

## 2016-07-07 NOTE — Patient Instructions (Signed)
Please keep skin clean and dry.  Apply Kenalog cream twice daily as directed. Cold compresses and over-the-counter Sarna lotion will also be beneficial. Avoid prolonged sunlight exposure.  Follow-up if there is not noted improvement within 48-72 hours.

## 2016-07-07 NOTE — Progress Notes (Signed)
Pre visit review using our clinic review tool, if applicable. No additional management support is needed unless otherwise documented below in the visit note/SLS  

## 2016-08-04 ENCOUNTER — Other Ambulatory Visit: Payer: Self-pay | Admitting: Family Medicine

## 2016-08-13 ENCOUNTER — Ambulatory Visit: Payer: Medicare Other | Admitting: Family Medicine

## 2016-08-21 ENCOUNTER — Other Ambulatory Visit (HOSPITAL_COMMUNITY): Payer: Self-pay | Admitting: *Deleted

## 2016-08-21 MED ORDER — POTASSIUM CHLORIDE ER 10 MEQ PO TBCR: EXTENDED_RELEASE_TABLET | ORAL | 6 refills | Status: DC

## 2016-08-28 ENCOUNTER — Ambulatory Visit (INDEPENDENT_AMBULATORY_CARE_PROVIDER_SITE_OTHER): Payer: Medicare Other | Admitting: Family Medicine

## 2016-08-28 ENCOUNTER — Encounter: Payer: Self-pay | Admitting: Family Medicine

## 2016-08-28 VITALS — BP 132/86 | HR 84 | Temp 98.1°F | Wt 272.8 lb

## 2016-08-28 DIAGNOSIS — M48061 Spinal stenosis, lumbar region without neurogenic claudication: Secondary | ICD-10-CM | POA: Diagnosis not present

## 2016-08-28 DIAGNOSIS — I10 Essential (primary) hypertension: Secondary | ICD-10-CM

## 2016-08-28 DIAGNOSIS — B379 Candidiasis, unspecified: Secondary | ICD-10-CM

## 2016-08-28 DIAGNOSIS — E782 Mixed hyperlipidemia: Secondary | ICD-10-CM | POA: Diagnosis not present

## 2016-08-28 DIAGNOSIS — R600 Localized edema: Secondary | ICD-10-CM | POA: Insufficient documentation

## 2016-08-28 LAB — LIPID PANEL
CHOL/HDL RATIO: 3
Cholesterol: 174 mg/dL (ref 0–200)
HDL: 51.7 mg/dL (ref >39.00)
LDL Cholesterol: 88 mg/dL (ref 0–99)
NONHDL: 121.9
Triglycerides: 171 mg/dL — ABNORMAL HIGH (ref 0.0–149.0)
VLDL: 34.2 mg/dL (ref 0.0–40.0)

## 2016-08-28 LAB — CBC
HCT: 37.8 % (ref 36.0–46.0)
HEMOGLOBIN: 13.1 g/dL (ref 12.0–15.0)
MCHC: 34.7 g/dL (ref 30.0–36.0)
MCV: 93.5 fl (ref 78.0–100.0)
Platelets: 218 10*3/uL (ref 150.0–400.0)
RBC: 4.04 Mil/uL (ref 3.87–5.11)
RDW: 12.9 % (ref 11.5–15.5)
WBC: 7 10*3/uL (ref 4.0–10.5)

## 2016-08-28 LAB — COMPREHENSIVE METABOLIC PANEL
ALK PHOS: 83 U/L (ref 39–117)
ALT: 13 U/L (ref 0–35)
AST: 15 U/L (ref 0–37)
Albumin: 4.4 g/dL (ref 3.5–5.2)
BILIRUBIN TOTAL: 0.6 mg/dL (ref 0.2–1.2)
BUN: 22 mg/dL (ref 6–23)
CO2: 30 mEq/L (ref 19–32)
CREATININE: 1.06 mg/dL (ref 0.40–1.20)
Calcium: 9.6 mg/dL (ref 8.4–10.5)
Chloride: 103 mEq/L (ref 96–112)
GFR: 52.96 mL/min — ABNORMAL LOW (ref >60.00)
GLUCOSE: 96 mg/dL (ref 70–99)
Potassium: 4.1 mEq/L (ref 3.5–5.1)
SODIUM: 142 meq/L (ref 135–145)
TOTAL PROTEIN: 6.9 g/dL (ref 6.0–8.3)

## 2016-08-28 LAB — TSH: TSH: 2.79 u[IU]/mL (ref 0.35–4.50)

## 2016-08-28 MED ORDER — FLUCONAZOLE 150 MG PO TABS: 150.0000 mg | ORAL_TABLET | Freq: Once | ORAL | 0 refills | Status: DC

## 2016-08-28 NOTE — Assessment & Plan Note (Signed)
Encouraged DASH diet, decrease po intake and increase exercise as tolerated. Needs 7-8 hours of sleep nightly. Avoid trans fats, eat small, frequent meals every 4-5 hours with lean proteins, complex carbs and healthy fats. Minimize simple carbs 

## 2016-08-28 NOTE — Assessment & Plan Note (Signed)
Well controlled, no changes to meds. Encouraged heart healthy diet such as the DASH diet and exercise as tolerated.  °

## 2016-08-28 NOTE — Assessment & Plan Note (Signed)
Having increased pain in posterior right hip but denies any recent fall or acute injury. Similar to pain she struggled with 45 years ago that required discectomy declines imaging or referral.

## 2016-08-28 NOTE — Assessment & Plan Note (Signed)
Tolerating statin, encouraged heart healthy diet, avoid trans fats, minimize simple carbs and saturated fats. Increase exercise as tolerated 

## 2016-08-28 NOTE — Progress Notes (Signed)
Pre visit review using our clinic review tool, if applicable. No additional management support is needed unless otherwise documented below in the visit note. 

## 2016-08-28 NOTE — Assessment & Plan Note (Signed)
Trace, encouraged compression hose minimize sodium and elevate feet. Continue current meds

## 2016-08-28 NOTE — Patient Instructions (Addendum)
Tylenol OTC 500mg  take one (1) in the morning and one (1) at night for pain On a bad pain day you can take two (2) in the morning and two (2) at night  Lidocaine patches for pain    [employer] for compression socks.  Back Pain, Adult Back pain is very common in adults.The cause of back pain is rarely dangerous and the pain often gets better over time.The cause of your back pain may not be known. Some common causes of back pain include:  Strain of the muscles or ligaments supporting the spine.  Wear and tear (degeneration) of the spinal disks.  Arthritis.  Direct injury to the back. For many people, back pain may return. Since back pain is rarely dangerous, most people can learn to manage this condition on their own. Follow these instructions at home: Watch your back pain for any changes. The following actions may help to lessen any discomfort you are feeling:  Remain active. It is stressful on your back to sit or stand in one place for long periods of time. Do not sit, drive, or stand in one place for more than 30 minutes at a time. Take short walks on even surfaces as soon as you are able.Try to increase the length of time you walk each day.  Exercise regularly as directed by your health care provider. Exercise helps your back heal faster. It also helps avoid future injury by keeping your muscles strong and flexible.  Do not stay in bed.Resting more than 1-2 days can delay your recovery.  Pay attention to your body when you bend and lift. The most comfortable positions are those that put less stress on your recovering back. Always use proper lifting techniques, including:  Bending your knees.  Keeping the load close to your body.  Avoiding twisting.  Find a comfortable position to sleep. Use a firm mattress and lie on your side with your knees slightly bent. If you lie on your back, put a pillow under your knees.  Avoid feeling anxious or stressed.Stress increases  muscle tension and can worsen back pain.It is important to recognize when you are anxious or stressed and learn ways to manage it, such as with exercise.  Take medicines only as directed by your health care provider. Over-the-counter medicines to reduce pain and inflammation are often the most helpful.Your health care provider may prescribe muscle relaxant drugs.These medicines help dull your pain so you can more quickly return to your normal activities and healthy exercise.  Apply ice to the injured area:  Put ice in a plastic bag.  Place a towel between your skin and the bag.  Leave the ice on for 20 minutes, 2-3 times a day for the first 2-3 days. After that, ice and heat may be alternated to reduce pain and spasms.  Maintain a healthy weight. Excess weight puts extra stress on your back and makes it difficult to maintain good posture. Contact a health care provider if:  You have pain that is not relieved with rest or medicine.  You have increasing pain going down into the legs or buttocks.  You have pain that does not improve in one week.  You have night pain.  You lose weight.  You have a fever or chills. Get help right away if:  You develop new bowel or bladder control problems.  You have unusual weakness or numbness in your arms or legs.  You develop nausea or vomiting.  You develop abdominal pain.  You feel faint. This information is not intended to replace advice given to you by your health care provider. Make sure you discuss any questions you have with your health care provider. Document Released: 08/17/2005 Document Revised: 12/26/2015 Document Reviewed: 12/19/2013 Elsevier Interactive Patient Education  2017 [employer].

## 2016-08-28 NOTE — Progress Notes (Signed)
Patient ID: Tracy Miles, female   DOB: [birth year], 82 y.o.   MRN: [id number]   Subjective:    Patient ID: Tracy Miles, female    DOB: 1933/12/15, 82 y.o.   MRN: [id number]  Chief Complaint  Patient presents with  . Follow-up    HPI Patient is in today for follow up patient complains of lower back pain that radiates to her right leg. Having increased pain in posterior right hip but denies any recent fall or acute injury. Similar to pain she struggled with 45 years ago that required a discectomy. Otherwise no recent hospitalization or acute illness. Denies CP/palp/SOB/HA/congestion/fevers/GI or GU c/o. Taking meds as prescribed  Past Medical History:  Diagnosis Date  . Abnormal results of thyroid function studies 12/17/2013  . Allergic state 11/19/2013  . Allergy   . Anemia   . Aortic stenosis    Very mild  . Arthritis   . Atrial fibrillation ([place])   . Benign paroxysmal positional vertigo 12/17/2013  . Chicken pox as a child  . Colon polyp   . Coronary artery disease   . DDD (degenerative disc disease) 11/16/2013  . Heart murmur   . Heme positive stool 11/16/2013  . History of valvular heart disease    LEFT  . Hypercholesteremia   . Hyperlipidemia   . Hypertension   . Iron deficiency anemia 11/13/2013  . Measles as a child  . Medicare annual wellness visit, subsequent 09/15/2015  . Mumps child and teenager  . OA (osteoarthritis) 11/19/2013   S/p L TKR  . Obesity   . Obstructive sleep apnea 02/22/2015  . OSA on CPAP   . PAF (paroxysmal atrial fibrillation) ([place])   . Pancreatitis    post hysterectomy  . Pedal edema 08/28/2016  . Personal history of colonic polyps 02/25/2014  . Personal history of DVT (deep vein thrombosis) X 2   "left"  . Sleep apnea   . Spinal stenosis   . Unspecified constipation 11/16/2013  . Unspecified sleep apnea 11/19/2013  . Visit for monitoring Tikosyn therapy 08/11/2015    Past Surgical History:  Procedure Laterality Date  . APPENDECTOMY     . BACK SURGERY    . CARDIOVERSION N/A 08/09/2015   Procedure: CARDIOVERSION;  Surgeon: Jerline Pain, MD;  Location: [facility];  Service: Cardiovascular;  Laterality: N/A;  . CATARACT EXTRACTION W/ INTRAOCULAR LENS  IMPLANT, BILATERAL Bilateral 2016  . CYST REMOVAL HAND Bilateral 1990's   "played to much golf"  . DILATION AND CURETTAGE OF UTERUS    . EYE SURGERY  2016   cataracts  . JOINT REPLACEMENT    . LUMBAR LAMINECTOMY  40 yrs ago   "L3-4"  . MENISCUS REPAIR Bilateral 2000 and 2010  . REPLACEMENT TOTAL KNEE Left 2013  . TONSILECTOMY, ADENOIDECTOMY, BILATERAL MYRINGOTOMY AND TUBES  child  . TONSILLECTOMY AND ADENOIDECTOMY    . TOTAL ABDOMINAL HYSTERECTOMY  1995   had 2 tumors- benign  . TUBAL LIGATION  82 years old    Family History  Problem Relation Age of Onset  . Heart attack Mother 72  . Hyperlipidemia Mother     ?  Marland Kitchen Dementia Mother   . Pernicious anemia Mother   . COPD Father     smoker  . Cancer Father 53    prostate  . Heart disease Brother     quadruple bipass surgery  . Hyperlipidemia Brother   . Hypertension Brother   . Diabetes Maternal Grandmother     type 2  .  Pernicious anemia Maternal Grandmother   . Colon cancer Neg Hx   . Pancreatic cancer Neg Hx   . Stomach cancer Neg Hx   . Throat cancer Neg Hx   . Liver disease Neg Hx   . Gout Son   . Atrial fibrillation Son   . Hyperlipidemia Son   . Cancer Maternal Grandfather     liver  . Cancer Paternal Grandmother     lung- doesn't think she smokes?  . Stroke Paternal Grandfather   . Cancer Son 50    non hodgin's lymphoma  . Gout Son   . Hyperlipidemia Son   . Sleep apnea Son     Social History   Social History  . Marital status: Widowed    Spouse name: N/A  . Number of children: 3  . Years of education: N/A   Occupational History  . retired Pharmacist, hospital    Social History Main Topics  . Smoking status: Never Smoker  . Smokeless tobacco: Never Used     Comment: never used tobacco    . Alcohol use No  . Drug use: No  . Sexual activity: No     Comment: lives at [place], low sodium diet   Other Topics Concern  . Not on file   Social History Narrative  . No narrative on file    Outpatient Medications Prior to Visit  Medication Sig Dispense Refill  . acetaminophen (TYLENOL) 325 MG tablet Take 325 mg by mouth 2 (two) times daily.    Marland Kitchen atorvastatin (LIPITOR) 20 MG tablet Take 1 tablet (20 mg total) by mouth daily at 6 PM. 90 tablet 1  . bumetanide (BUMEX) 1 MG tablet TAKE ONE (1) TABLET EACH DAY 90 tablet 0  . calcium carbonate (OS-CAL) 600 MG TABS tablet Take 600 mg by mouth 2 (two) times daily with a meal.    . diltiazem (CARDIZEM CD) 120 MG 24 hr capsule Take 1 capsule (120 mg total) by mouth daily. 30 capsule 6  . dofetilide (TIKOSYN) 250 MCG capsule Take 1 capsule (250 mcg total) by mouth 2 (two) times daily. 60 capsule 3  . ELIQUIS 5 MG TABS tablet TAKE ONE (1) TABLET BY MOUTH TWO (2) TIMES DAILY 180 tablet 0  . hydrocortisone cream 1 % Apply 1 application topically 4 (four) times daily. OTC    . Lactobacillus (PROBIOTIC ACIDOPHILUS) TABS Take 1 tablet by mouth daily. Reported on 03/04/2016    . lisinopril (PRINIVIL,ZESTRIL) 10 MG tablet Take 1 tablet (10 mg total) by mouth daily. 90 tablet 1  . loratadine (CLARITIN) 10 MG tablet Take 10 mg by mouth daily.     . potassium chloride (KLOR-CON 10) 10 MEQ tablet Take one tablet in the morning and two tablets in the pm (Patient taking differently: Take two tablet in the morning and 3 tablets in the pm) 90 tablet 6  . psyllium (REGULOID) 0.52 g capsule Take 0.52 g by mouth daily.    Marland Kitchen triamcinolone cream (KENALOG) 0.1 % Apply 1 application topically 2 (two) times daily. (Patient not taking: Reported on 08/28/2016) 30 g 0   No facility-administered medications prior to visit.      Review of Systems  Constitutional: Positive for malaise/fatigue. Negative for fever.  HENT: Negative for congestion.   Eyes:  Negative for blurred vision.  Respiratory: Negative for cough and shortness of breath.   Cardiovascular: Negative for chest pain, palpitations and leg swelling.  Gastrointestinal: Negative for vomiting.  Musculoskeletal: Positive for back  pain and joint pain.  Skin: Negative for rash.  Neurological: Negative for loss of consciousness and headaches.       Objective:    Physical Exam  Constitutional: She is oriented to person, place, and time. She appears well-developed and well-nourished. No distress.  HENT:  Head: Normocephalic and atraumatic.  Eyes: Conjunctivae are normal.  Neck: Normal range of motion. No thyromegaly present.  Cardiovascular: Normal rate and regular rhythm.   Murmur heard. 1/6 systolic murmur  Pulmonary/Chest: Effort normal and breath sounds normal. She has no wheezes.  Abdominal: Soft. Bowel sounds are normal. There is no tenderness.  Musculoskeletal: Normal range of motion. She exhibits no edema or deformity.  Neurological: She is alert and oriented to person, place, and time.  Skin: Skin is warm and dry. She is not diaphoretic.  Psychiatric: She has a normal mood and affect.    BP 132/86   Pulse 84   Temp 98.1 F (36.7 C) (Oral)   Wt 272 lb 12.8 oz (123.7 kg)   SpO2 97%   BMI 48.32 kg/m  Wt Readings from Last 3 Encounters:  08/28/16 272 lb 12.8 oz (123.7 kg)  07/07/16 266 lb (120.7 kg)  06/17/16 268 lb (121.6 kg)     Lab Results  Component Value Date   WBC 6.7 04/20/2016   HGB 13.3 04/20/2016   HCT 38.9 04/20/2016   PLT 229.0 04/20/2016   GLUCOSE 106 (H) 06/17/2016   CHOL 166 04/20/2016   TRIG 165.0 (H) 04/20/2016   HDL 48.10 04/20/2016   LDLCALC 85 04/20/2016   ALT 10 04/03/2016   AST 14 04/03/2016   NA 141 06/17/2016   K 4.1 06/17/2016   CL 103 06/17/2016   CREATININE 0.95 06/17/2016   BUN 17 06/17/2016   CO2 31 06/17/2016   TSH 3.13 04/20/2016   INR 1.55 (H) 08/06/2015    Lab Results  Component Value Date   TSH 3.13  04/20/2016   Lab Results  Component Value Date   WBC 6.7 04/20/2016   HGB 13.3 04/20/2016   HCT 38.9 04/20/2016   MCV 93.3 04/20/2016   PLT 229.0 04/20/2016   Lab Results  Component Value Date   NA 141 06/17/2016   K 4.1 06/17/2016   CO2 31 06/17/2016   GLUCOSE 106 (H) 06/17/2016   BUN 17 06/17/2016   CREATININE 0.95 06/17/2016   BILITOT 0.6 04/03/2016   ALKPHOS 76 04/03/2016   AST 14 04/03/2016   ALT 10 04/03/2016   PROT 7.0 04/03/2016   ALBUMIN 4.3 04/03/2016   CALCIUM 9.7 06/17/2016   ANIONGAP 7 06/17/2016   GFR 58.73 (L) 04/03/2016   Lab Results  Component Value Date   CHOL 166 04/20/2016   Lab Results  Component Value Date   HDL 48.10 04/20/2016   Lab Results  Component Value Date   LDLCALC 85 04/20/2016   Lab Results  Component Value Date   TRIG 165.0 (H) 04/20/2016   Lab Results  Component Value Date   CHOLHDL 3 04/20/2016   No results found for: HGBA1C    I acted as a Education administrator for Dr. Charlett Blake. Princess, RMA  Assessment & Plan:   Problem List Items Addressed This Visit    Essential hypertension - Primary    Well controlled, no changes to meds. Encouraged heart healthy diet such as the DASH diet and exercise as tolerated.       Relevant Orders   CBC   Comprehensive metabolic panel   TSH  Hyperlipidemia, mixed    Tolerating statin, encouraged heart healthy diet, avoid trans fats, minimize simple carbs and saturated fats. Increase exercise as tolerated      Relevant Orders   Lipid panel   Spinal stenosis of lumbar region    Having increased pain in posterior right hip but denies any recent fall or acute injury. Similar to pain she struggled with 45 years ago that required discectomy declines imaging or referral.       Pedal edema    Trace, encouraged compression hose minimize sodium and elevate feet. Continue current meds       Other Visit Diagnoses    Yeast infection          I have discontinued Ms. Jasperson's fluconazole. I am also  having her maintain her calcium carbonate, loratadine, PROBIOTIC ACIDOPHILUS, dofetilide, psyllium, atorvastatin, lisinopril, diltiazem, ELIQUIS, hydrocortisone cream, acetaminophen, triamcinolone cream, bumetanide, and potassium chloride.  Meds ordered this encounter  Medications  . DISCONTD: fluconazole (DIFLUCAN) 150 MG tablet    Sig: Take 1 tablet (150 mg total) by mouth once.    Dispense:  2 tablet    Refill:  0    CMA functioned as scribe in visit. History, physical and plan performed by MD [name], MD

## 2016-09-08 ENCOUNTER — Other Ambulatory Visit: Payer: Self-pay | Admitting: Family Medicine

## 2016-09-16 ENCOUNTER — Ambulatory Visit (HOSPITAL_COMMUNITY): Payer: Medicare Other | Admitting: Nurse Practitioner

## 2016-09-22 ENCOUNTER — Encounter (HOSPITAL_COMMUNITY): Payer: Self-pay | Admitting: Nurse Practitioner

## 2016-09-22 ENCOUNTER — Ambulatory Visit (HOSPITAL_COMMUNITY)
Admission: RE | Admit: 2016-09-22 | Discharge: 2016-09-22 | Disposition: A | Discharge status: Home / Self Care | Payer: Medicare Other | Source: Ambulatory Visit | Attending: Nurse Practitioner | Admitting: Nurse Practitioner

## 2016-09-22 VITALS — BP 118/76 | HR 147 | Ht 63.0 in | Wt 271.2 lb

## 2016-09-22 DIAGNOSIS — Z833 Family history of diabetes mellitus: Secondary | ICD-10-CM | POA: Insufficient documentation

## 2016-09-22 DIAGNOSIS — Z888 Allergy status to other drugs, medicaments and biological substances status: Secondary | ICD-10-CM | POA: Diagnosis not present

## 2016-09-22 DIAGNOSIS — Z79899 Other long term (current) drug therapy: Secondary | ICD-10-CM | POA: Diagnosis not present

## 2016-09-22 DIAGNOSIS — I350 Nonrheumatic aortic (valve) stenosis: Secondary | ICD-10-CM | POA: Insufficient documentation

## 2016-09-22 DIAGNOSIS — M1990 Unspecified osteoarthritis, unspecified site: Secondary | ICD-10-CM | POA: Insufficient documentation

## 2016-09-22 DIAGNOSIS — Z86718 Personal history of other venous thrombosis and embolism: Secondary | ICD-10-CM | POA: Diagnosis not present

## 2016-09-22 DIAGNOSIS — Z6841 Body Mass Index (BMI) 40.0 and over, adult: Secondary | ICD-10-CM | POA: Diagnosis not present

## 2016-09-22 DIAGNOSIS — Z9989 Dependence on other enabling machines and devices: Secondary | ICD-10-CM

## 2016-09-22 DIAGNOSIS — I481 Persistent atrial fibrillation: Secondary | ICD-10-CM | POA: Diagnosis not present

## 2016-09-22 DIAGNOSIS — I10 Essential (primary) hypertension: Secondary | ICD-10-CM | POA: Diagnosis not present

## 2016-09-22 DIAGNOSIS — H8110 Benign paroxysmal vertigo, unspecified ear: Secondary | ICD-10-CM | POA: Diagnosis not present

## 2016-09-22 DIAGNOSIS — I4819 Other persistent atrial fibrillation: Secondary | ICD-10-CM

## 2016-09-22 DIAGNOSIS — Z9889 Other specified postprocedural states: Secondary | ICD-10-CM | POA: Insufficient documentation

## 2016-09-22 DIAGNOSIS — I2510 Atherosclerotic heart disease of native coronary artery without angina pectoris: Secondary | ICD-10-CM | POA: Insufficient documentation

## 2016-09-22 DIAGNOSIS — G4733 Obstructive sleep apnea (adult) (pediatric): Secondary | ICD-10-CM | POA: Insufficient documentation

## 2016-09-22 DIAGNOSIS — Z800 Family history of malignant neoplasm of digestive organs: Secondary | ICD-10-CM | POA: Insufficient documentation

## 2016-09-22 DIAGNOSIS — Z8249 Family history of ischemic heart disease and other diseases of the circulatory system: Secondary | ICD-10-CM | POA: Insufficient documentation

## 2016-09-22 DIAGNOSIS — K859 Acute pancreatitis without necrosis or infection, unspecified: Secondary | ICD-10-CM | POA: Insufficient documentation

## 2016-09-22 DIAGNOSIS — Z8042 Family history of malignant neoplasm of prostate: Secondary | ICD-10-CM | POA: Diagnosis not present

## 2016-09-22 DIAGNOSIS — Z86010 Personal history of colonic polyps: Secondary | ICD-10-CM | POA: Diagnosis not present

## 2016-09-22 DIAGNOSIS — E785 Hyperlipidemia, unspecified: Secondary | ICD-10-CM | POA: Diagnosis not present

## 2016-09-22 DIAGNOSIS — Z880 Allergy status to penicillin: Secondary | ICD-10-CM | POA: Insufficient documentation

## 2016-09-22 HISTORY — DX: Other persistent atrial fibrillation: I48.19

## 2016-09-22 LAB — CBC
HEMATOCRIT: 39.3 % (ref 36.0–46.0)
HEMOGLOBIN: 13.3 g/dL (ref 12.0–15.0)
MCH: 31.8 pg (ref 26.0–34.0)
MCHC: 33.8 g/dL (ref 30.0–36.0)
MCV: 94 fL (ref 78.0–100.0)
Platelets: 234 10*3/uL (ref 150–400)
RBC: 4.18 MIL/uL (ref 3.87–5.11)
RDW: 13 % (ref 11.5–15.5)
WBC: 8.4 10*3/uL (ref 4.0–10.5)

## 2016-09-22 LAB — BASIC METABOLIC PANEL
ANION GAP: 7 (ref 5–15)
BUN: 23 mg/dL — ABNORMAL HIGH (ref 6–20)
CALCIUM: 9.9 mg/dL (ref 8.9–10.3)
CHLORIDE: 104 mmol/L (ref 101–111)
CO2: 27 mmol/L (ref 22–32)
Creatinine, Ser: 1.09 mg/dL — ABNORMAL HIGH (ref 0.44–1.00)
GFR calc non Af Amer: 47 mL/min — ABNORMAL LOW (ref >60)
GFR, EST AFRICAN AMERICAN: 54 mL/min — AB (ref >60)
Glucose, Bld: 112 mg/dL — ABNORMAL HIGH (ref 65–99)
Potassium: 4.3 mmol/L (ref 3.5–5.1)
SODIUM: 138 mmol/L (ref 135–145)

## 2016-09-22 LAB — MAGNESIUM: Magnesium: 2 mg/dL (ref 1.7–2.4)

## 2016-09-22 MED ORDER — DILTIAZEM HCL 30 MG PO TABS: ORAL_TABLET | ORAL | 3 refills | Status: DC

## 2016-09-22 NOTE — Progress Notes (Signed)
Primary Care Physician: [name], MD Primary Cardiologist: Meda Coffee Primary Electrophysiologist: Lavone Neri is a 83 y.o. female with a history of persistent atrial fibrillation who presents for follow up in the [facility].  She was previously on amiodarone but this was discontinued by patient due to concerns about long term side effects. 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 She was subsequently started on Tikosyn in December of 2016 and has done well without recurrence of atrial fibrillation until last Thursday. She has been symptomatic with shortness of breath and fatigue since then and feels that she has persistently been in AF.  She reports compliance with anticoagulation without missed doses.  She feels that this episode of atrial fibrillation may have been triggered by worsening back pain. She has seen orthopedic surgery who has done steroid injections but felt that these did not help. She is taking Tylenol for pain and does not want to take anything stronger.   She is still walking in the pool [facility] 3 days/week for an hour at a time but has been more fatigued since last Thursday.    Atrial Fibrillation Risk Factors:  she does have symptoms or diagnosis of sleep apnea. she is compliant with CPAP therapy.  she does not have a history of rheumatic fever.  she does not have a history of alcohol use.  she has a BMI of Body mass index is 48.04 kg/m.Marland Kitchen Filed Weights   09/22/16 0944  Weight: 271 lb 3.2 oz (123 kg)    LA size: 41   Atrial Fibrillation Management history:  Previous antiarrhythmic drugs: amiodarone (stopped 2/2 patient concerns, with subsequent corneal deposits identified); Tikosyn started 08/2015 (was on 579mcg initially but decreased to 268mcg 2/2 prolonged QTc, 326mcg not tried)  Previous cardioversions: 2016  Previous ablations:  none  CHADS2VASC score: 5  Anticoagulation history: previous bleeding issues on Xarelto, has done well on Eliquis    Past Medical History:  Diagnosis Date  . Anemia   . Aortic stenosis    Very mild  . Benign paroxysmal positional vertigo 12/17/2013  . Chicken pox as a child  . Colon polyp   . Coronary artery disease   . DDD (degenerative disc disease) 11/16/2013  . Hyperlipidemia   . Hypertension   . Iron deficiency anemia 11/13/2013  . Mumps child and teenager  . OA (osteoarthritis) 11/19/2013   S/p L TKR  . Obesity   . OSA on CPAP   . Pancreatitis    post hysterectomy  . Persistent atrial fibrillation ([place])   . Personal history of colonic polyps 02/25/2014  . Personal history of DVT (deep vein thrombosis) X 2   "left"  . Sleep apnea   . Spinal stenosis    Past Surgical History:  Procedure Laterality Date  . APPENDECTOMY    . CARDIOVERSION N/A 08/09/2015   Procedure: CARDIOVERSION;  Surgeon: Jerline Pain, MD;  Location: [facility];  Service: Cardiovascular;  Laterality: N/A;  . CATARACT EXTRACTION W/ INTRAOCULAR LENS  IMPLANT, BILATERAL Bilateral 2016  . CYST REMOVAL HAND Bilateral 1990's   "played to much golf"  . DILATION AND CURETTAGE OF UTERUS    . LUMBAR LAMINECTOMY  40 yrs ago   "L3-4"  . MENISCUS REPAIR Bilateral 2000 and 2010  . REPLACEMENT TOTAL KNEE Left 2013  . TONSILECTOMY, ADENOIDECTOMY, BILATERAL MYRINGOTOMY AND TUBES  child  . TOTAL ABDOMINAL  HYSTERECTOMY  1995   had 2 tumors- benign  . TUBAL LIGATION  83 years old    Current Outpatient Prescriptions  Medication Sig Dispense Refill  . acetaminophen (TYLENOL) 325 MG tablet Take 325 mg by mouth 2 (two) times daily.    Marland Kitchen atorvastatin (LIPITOR) 20 MG tablet Take 1 tablet (20 mg total) by mouth daily at 6 PM. 90 tablet 1  . bumetanide (BUMEX) 1 MG tablet TAKE ONE (1) TABLET EACH DAY 90 tablet 0  . calcium carbonate (OS-CAL) 600 MG TABS tablet Take 600 mg by mouth 2 (two) times daily with a meal.    .  diltiazem (CARDIZEM CD) 120 MG 24 hr capsule Take 1 capsule (120 mg total) by mouth daily. 30 capsule 6  . dofetilide (TIKOSYN) 250 MCG capsule Take 1 capsule (250 mcg total) by mouth 2 (two) times daily. 60 capsule 3  . ELIQUIS 5 MG TABS tablet TAKE ONE (1) TABLET BY MOUTH TWO (2) TIMES DAILY 180 tablet 1  . hydrocortisone cream 1 % Apply 1 application topically 4 (four) times daily. OTC    . Lactobacillus (PROBIOTIC ACIDOPHILUS) TABS Take 1 tablet by mouth daily. Reported on 03/04/2016    . lisinopril (PRINIVIL,ZESTRIL) 10 MG tablet Take 1 tablet (10 mg total) by mouth daily. 90 tablet 1  . loratadine (CLARITIN) 10 MG tablet Take 10 mg by mouth daily.     . potassium chloride (KLOR-CON 10) 10 MEQ tablet Take one tablet in the morning and two tablets in the pm (Patient taking differently: No sig reported) 90 tablet 6  . psyllium (REGULOID) 0.52 g capsule Take 0.52 g by mouth daily.    Marland Kitchen diltiazem (CARDIZEM) 30 MG tablet Take 1 tablet every 4 hours AS NEEDED for heart rate >100 as long as blood pressure >100. 45 tablet 3   No current facility-administered medications for this encounter.     Allergies  Allergen Reactions  . Gabapentin Swelling  . Lactose Intolerance (Gi) Other (See Comments)    Bothers her stomach  . Zebeta [Bisoprolol Fumarate] Nausea Only  . Penicillins Rash      . Sulfa Antibiotics Rash    Social History   Social History  . Marital status: Widowed    Spouse name: N/A  . Number of children: 3  . Years of education: N/A   Occupational History  . retired Pharmacist, hospital    Social History Main Topics  . Smoking status: Never Smoker  . Smokeless tobacco: Never Used     Comment: never used tobacco  . Alcohol use No  . Drug use: No  . Sexual activity: No     Comment: lives at [place], low sodium diet   Other Topics Concern  . Not on file   Social History Narrative  . No narrative on file    Family History  Problem Relation Age of Onset  . Heart attack  Mother 7  . Hyperlipidemia Mother     ?  Marland Kitchen Dementia Mother   . Pernicious anemia Mother   . COPD Father     smoker  . Cancer Father 42    prostate  . Heart disease Brother     quadruple bipass surgery  . Hyperlipidemia Brother   . Hypertension Brother   . Diabetes Maternal Grandmother     type 2  . Pernicious anemia Maternal Grandmother   . Colon cancer Neg Hx   . Pancreatic cancer Neg Hx   . Stomach cancer Neg Hx   .  Throat cancer Neg Hx   . Liver disease Neg Hx   . Gout Son   . Atrial fibrillation Son   . Hyperlipidemia Son   . Cancer Maternal Grandfather     liver  . Cancer Paternal Grandmother     lung- doesn't think she smokes?  . Stroke Paternal Grandfather   . Cancer Son 50    non hodgin's lymphoma  . Gout Son   . Hyperlipidemia Son   . Sleep apnea Son    The patient does not have a history of early familial atrial fibrillation or other arrhythmias.  ROS- All systems are reviewed and negative except as per the HPI above.  Physical Exam: Vitals:   09/22/16 0944  BP: 118/76  Pulse: (!) 147  Weight: 271 lb 3.2 oz (123 kg)  Height: 5\' 3"  (1.6 m)    GEN- The patient is well appearing, alert and oriented x 3 today.   Head- normocephalic, atraumatic Eyes-  Sclera clear, conjunctiva pink Ears- hearing intact Oropharynx- clear Neck- supple  Lungs- Clear to ausculation bilaterally, normal work of breathing Heart- Regular rate and rhythm, no murmurs, rubs or gallops  GI- soft, NT, ND, + BS Extremities- no clubbing, cyanosis, or edema MS- no significant deformity or atrophy Skin- no rash or lesion Psych- euthymic mood, full affect Neuro- strength and sensation are intact  Wt Readings from Last 3 Encounters:  09/22/16 271 lb 3.2 oz (123 kg)  08/28/16 272 lb 12.8 oz (123.7 kg)  07/07/16 266 lb (120.7 kg)    EKG today demonstrates atrial fibrillation, rate 147 Echo 08/2015 demonstrated EF 55-60%, no RWMA, mild MR, LA 41  Epic records are reviewed at  length today  Assessment and Plan:  1. Persistent atrial fibrillation The patient has symptomatic persistent atrial fibrillation. She has had good success with Tikosyn for the last year until returning to AF last week.  As she has maintained SR until now, I think DCCV is reasonable at this time.  She reports compliance with Eliquis with no missed doses for the last 4 weeks.  Continue Eliquis long term for CHADS2VASC of 5 If she has recurrent AF after DCCV, could consider increasing Tikosyn to 339mcg - would require rehospitalization and would like to avoid if possible.   2. Morbid obesity Body mass index is 48.04 kg/m. Weight loss advised   3. Obstructive sleep apnea The importance of adequate treatment of sleep apnea was discussed today in order to improve our ability to maintain sinus rhythm long term.  Follow up with AF clinic after cardioversion    Chanetta Marshall, NP 09/22/2016 3:16 PM

## 2016-09-22 NOTE — Patient Instructions (Addendum)
Your physician has recommended you make the following change in your medication:  1)Cardizem 30mg  -- take 1 tablet every 4 hours AS NEEDED for heart rate >100 as long as blood pressure (top number) >100.    Cardioversion scheduled for Thursday, January 25th  - Arrive at the [facility] and go to admitting at 9:30AM  -Do not eat or drink anything after midnight the night prior to your procedure.  - Take all your medication with a sip of water prior to arrival.  - You will not be able to drive home after your procedure.

## 2016-09-24 ENCOUNTER — Ambulatory Visit (HOSPITAL_COMMUNITY)
Admission: RE | Admit: 2016-09-24 | Discharge: 2016-09-24 | Disposition: A | Discharge status: Home / Self Care | Payer: Medicare Other | Source: Ambulatory Visit | Attending: Internal Medicine | Admitting: Internal Medicine

## 2016-09-24 ENCOUNTER — Encounter (HOSPITAL_COMMUNITY)
Admission: RE | Disposition: A | Discharge status: Home / Self Care | Payer: Self-pay | Source: Ambulatory Visit | Attending: Internal Medicine

## 2016-09-24 DIAGNOSIS — Z538 Procedure and treatment not carried out for other reasons: Secondary | ICD-10-CM | POA: Insufficient documentation

## 2016-09-24 DIAGNOSIS — I4581 Long QT syndrome: Secondary | ICD-10-CM | POA: Diagnosis not present

## 2016-09-24 DIAGNOSIS — I480 Paroxysmal atrial fibrillation: Secondary | ICD-10-CM | POA: Insufficient documentation

## 2016-09-24 DIAGNOSIS — I4891 Unspecified atrial fibrillation: Secondary | ICD-10-CM | POA: Diagnosis present

## 2016-09-24 SURGERY — CANCELLED PROCEDURE

## 2016-09-24 MED ORDER — SODIUM CHLORIDE 0.9 % IV SOLN: INTRAVENOUS | Status: DC

## 2016-09-24 NOTE — Progress Notes (Signed)
Patient admitted to Endo unit. Patient placed on monitor and NSR seen. EKG obtained and confirmed with MD. Patient discharged home with instructions to continue all medications and keep follow up scheduled. Patient showed understanding of sign and symptoms if heart was to return to afib and to call MD office.

## 2016-09-25 NOTE — Progress Notes (Signed)
Subjective:   Tracy Miles is a 83 y.o. female who presents for Medicare Annual (Subsequent) preventive examination.  Review of Systems:  No ROS.  Medicare Wellness Visit. Cardiac Risk Factors include: advanced age (>43men, >99 women);dyslipidemia;hypertension;obesity (BMI >30kg/m2) Sleep patterns: Wears Bi-Pap at night. Sleeps 7 hrs per night. Feels rested. Home Safety/Smoke Alarms:  Feels safe in home. Smoke alarms in place.  Living environment; residence and Firearm Safety: Lives in [place]. Seat Belt Safety/Bike Helmet: Wears seat belt.   Counseling:   Eye Exam- Wears glasses. Dr.Digby annually. Dental- Dr.Nunn every 4 months.  Female:   Pap-  Hysterectomy.     Mammo-  Last 05/12/16: BI-RADS CATEGORY  1: Negative. Dexa scan-  Last 09/13/15: Normal      CCS- Last 11/21/13: Precancerous polyp removed. F/u 5 years per report.     Objective:     Vitals: BP 120/70 (BP Location: Left Arm, Patient Position: Sitting, Cuff Size: Large)   Pulse 76   Ht 5\' 3"  (1.6 m)   Wt 268 lb 6.4 oz (121.7 kg)   SpO2 98%   BMI 47.54 kg/m   Body mass index is 47.54 kg/m.   Tobacco History  Smoking Status  . Never Smoker  Smokeless Tobacco  . Never Used    Comment: never used tobacco     Counseling given: No   Past Medical History:  Diagnosis Date  . Anemia   . Aortic stenosis    Very mild  . Benign paroxysmal positional vertigo 12/17/2013  . Chicken pox as a child  . Colon polyp   . Coronary artery disease   . DDD (degenerative disc disease) 11/16/2013  . Hyperlipidemia   . Hypertension   . Iron deficiency anemia 11/13/2013  . Mumps child and teenager  . OA (osteoarthritis) 11/19/2013   S/p L TKR  . Obesity   . OSA on CPAP   . Pancreatitis    post hysterectomy  . Persistent atrial fibrillation ([place])   . Personal history of colonic polyps 02/25/2014  . Personal history of DVT (deep vein thrombosis) X 2   "left"  . Sleep apnea   . Spinal stenosis      Past Surgical History:  Procedure Laterality Date  . APPENDECTOMY    . CARDIOVERSION N/A 08/09/2015   Procedure: CARDIOVERSION;  Surgeon: Jerline Pain, MD;  Location: [facility];  Service: Cardiovascular;  Laterality: N/A;  . CATARACT EXTRACTION W/ INTRAOCULAR LENS  IMPLANT, BILATERAL Bilateral 2016  . CYST REMOVAL HAND Bilateral 1990's   "played to much golf"  . DILATION AND CURETTAGE OF UTERUS    . LUMBAR LAMINECTOMY  40 yrs ago   "L3-4"  . MENISCUS REPAIR Bilateral 2000 and 2010  . REPLACEMENT TOTAL KNEE Left 2013  . TONSILECTOMY, ADENOIDECTOMY, BILATERAL MYRINGOTOMY AND TUBES  child  . TOTAL ABDOMINAL HYSTERECTOMY  1995   had 2 tumors- benign  . TUBAL LIGATION  83 years old   Family History  Problem Relation Age of Onset  . Heart attack Mother 60  . Hyperlipidemia Mother     ?  Marland Kitchen Dementia Mother   . Pernicious anemia Mother   . COPD Father     smoker  . Cancer Father 5    prostate  . Heart disease Brother     quadruple bipass surgery  . Hyperlipidemia Brother   . Hypertension Brother   . Diabetes Maternal Grandmother     type 2  . Pernicious anemia Maternal Grandmother   .  Gout Son   . Atrial fibrillation Son   . Hyperlipidemia Son   . Cancer Maternal Grandfather     liver  . Cancer Paternal Grandmother     lung- doesn't think she smokes?  . Stroke Paternal Grandfather   . Cancer Son 50    non hodgin's lymphoma  . Gout Son   . Hyperlipidemia Son   . Sleep apnea Son   . Colon cancer Neg Hx   . Pancreatic cancer Neg Hx   . Stomach cancer Neg Hx   . Throat cancer Neg Hx   . Liver disease Neg Hx    History  Sexual Activity  . Sexual activity: No    Comment: lives at [place], low sodium diet    Outpatient Encounter Prescriptions as of 09/29/2016  Medication Sig  . acetaminophen (TYLENOL) 325 MG tablet Take 325 mg by mouth 2 (two) times daily.  Marland Kitchen atorvastatin (LIPITOR) 20 MG tablet Take 1 tablet (20 mg total) by mouth daily at 6 PM.  .  bumetanide (BUMEX) 1 MG tablet TAKE ONE (1) TABLET EACH DAY  . calcium carbonate (OS-CAL) 600 MG TABS tablet Take 600 mg by mouth 2 (two) times daily with a meal.  . diltiazem (CARDIZEM CD) 120 MG 24 hr capsule Take 1 capsule (120 mg total) by mouth daily.  Marland Kitchen dofetilide (TIKOSYN) 250 MCG capsule Take 1 capsule (250 mcg total) by mouth 2 (two) times daily.  Marland Kitchen ELIQUIS 5 MG TABS tablet TAKE ONE (1) TABLET BY MOUTH TWO (2) TIMES DAILY  . hydrocortisone cream 1 % Apply 1 application topically 4 (four) times daily. OTC  . Lactobacillus (PROBIOTIC ACIDOPHILUS) TABS Take 1 tablet by mouth daily. Reported on 03/04/2016  . lisinopril (PRINIVIL,ZESTRIL) 10 MG tablet Take 1 tablet (10 mg total) by mouth daily.  Marland Kitchen loratadine (CLARITIN) 10 MG tablet Take 10 mg by mouth daily.   . potassium chloride (KLOR-CON 10) 10 MEQ tablet Take one tablet in the morning and two tablets in the pm (Patient taking differently: No sig reported)  . psyllium (REGULOID) 0.52 g capsule Take 0.52 g by mouth 2 (two) times daily.   Marland Kitchen diltiazem (CARDIZEM) 30 MG tablet Take 1 tablet every 4 hours AS NEEDED for heart rate >100 as long as blood pressure >100. (Patient not taking: Reported on 09/29/2016)   No facility-administered encounter medications on file as of 09/29/2016.     Activities of Daily Living In your present state of health, do you have any difficulty performing the following activities: 09/29/2016  Hearing? N  Vision? N  Difficulty concentrating or making decisions? N  Walking or climbing stairs? N  Dressing or bathing? N  Doing errands, shopping? N  Preparing Food and eating ? N  Using the Toilet? N  In the past six months, have you accidently leaked urine? N  Do you have problems with loss of bowel control? N  Managing your Medications? N  Managing your Finances? N  Housekeeping or managing your Housekeeping? N  Some recent data might be hidden    Patient Care Team: [name], MD as PCP - General (Family  Medicine)    Assessment:    Physical assessment deferred to PCP.  Exercise Activities and Dietary recommendations Current Exercise Habits: Structured exercise class, Time (Minutes): 60, Frequency (Times/Week): 3, Weekly Exercise (Minutes/Week): 180, Intensity: Mild   Diet (meal preparation, eat out, water intake, caffeinated beverages, dairy products, fruits and vegetables): in general, a "healthy" diet  , on  average, 3 meals per day    Goals    . Would love to play golf again      Fall Risk Fall Risk  09/29/2016 09/06/2015 08/23/2014 01/03/2014  Falls in the past year? No No Yes Yes  Number falls in past yr: - - 1 1  Injury with Fall? - - - No   Depression Screen PHQ 2/9 Scores 09/29/2016 09/06/2015 08/23/2014  PHQ - 2 Score 0 0 0     Cognitive Function MMSE - Mini Mental State Exam 09/29/2016  Orientation to time 5  Orientation to Place 5  Registration 3  Attention/ Calculation 5  Recall 3  Language- name 2 objects 2  Language- repeat 1  Language- follow 3 step command 3  Language- read & follow direction 1  Write a sentence 1  Copy design 1  Total score 30        Immunization History  Administered Date(s) Administered  . Influenza,inj,Quad PF,36+ Mos 04/30/2016  . Influenza-Unspecified 05/31/2013, 05/01/2014, 06/11/2015  . Pneumococcal Conjugate-13 04/02/2014  . Pneumococcal Polysaccharide-23 08/31/1998, 09/06/2015  . Tdap 07/31/2012   Screening Tests Health Maintenance  Topic Date Due  . COLONOSCOPY  11/22/2018  . TETANUS/TDAP  07/31/2022  . INFLUENZA VACCINE  Completed  . DEXA SCAN  Completed  . ZOSTAVAX  Completed  . PNA vac Low Risk Adult  Completed      Plan:      Follow up with Dr. Charlett Blake as scheduled 11/25/16.  Continue to eat heart healthy diet (full of fruits, vegetables, whole grains, lean protein, water--limit salt, fat, and sugar intake) and increase physical activity as tolerated.   During the course of the visit the patient was educated  and counseled about the following appropriate screening and preventive services:   Vaccines to include Pneumoccal, Influenza, Hepatitis B, Td, Zostavax, HCV  Cardiovascular Disease  Colorectal cancer screening  Bone density screening  Diabetes screening  Glaucoma screening  Mammography/PAP  Nutrition counseling   Patient Instructions (the written plan) was given to the patient.   Shela Nevin, [place]  09/29/2016

## 2016-09-25 NOTE — Progress Notes (Signed)
Pre visit review using our clinic review tool, if applicable. No additional management support is needed unless otherwise documented below in the visit note. 

## 2016-09-29 ENCOUNTER — Encounter: Payer: Self-pay | Admitting: *Deleted

## 2016-09-29 ENCOUNTER — Ambulatory Visit (INDEPENDENT_AMBULATORY_CARE_PROVIDER_SITE_OTHER): Payer: Medicare Other | Admitting: *Deleted

## 2016-09-29 VITALS — BP 120/70 | HR 76 | Ht 63.0 in | Wt 268.4 lb

## 2016-09-29 DIAGNOSIS — Z0000 Encounter for general adult medical examination without abnormal findings: Secondary | ICD-10-CM | POA: Diagnosis not present

## 2016-09-29 NOTE — Patient Instructions (Signed)
Follow up with Dr. Charlett Blake as scheduled 11/25/16.  Continue to eat heart healthy diet (full of fruits, vegetables, whole grains, lean protein, water--limit salt, fat, and sugar intake) and increase physical activity as tolerated.

## 2016-09-29 NOTE — Progress Notes (Signed)
RN AWV note reviewed. Agree with documention and plan. 

## 2016-10-08 ENCOUNTER — Other Ambulatory Visit (HOSPITAL_COMMUNITY): Payer: Self-pay | Admitting: *Deleted

## 2016-10-08 MED ORDER — DOFETILIDE 250 MCG PO CAPS: 250.0000 ug | ORAL_CAPSULE | Freq: Two times a day (BID) | ORAL | 3 refills | Status: DC

## 2016-10-14 ENCOUNTER — Ambulatory Visit (HOSPITAL_COMMUNITY)
Admission: RE | Admit: 2016-10-14 | Discharge: 2016-10-14 | Disposition: A | Discharge status: Home / Self Care | Payer: Medicare Other | Source: Ambulatory Visit | Attending: Nurse Practitioner | Admitting: Nurse Practitioner

## 2016-10-14 ENCOUNTER — Encounter (HOSPITAL_COMMUNITY): Payer: Self-pay | Admitting: Nurse Practitioner

## 2016-10-14 VITALS — BP 146/74 | HR 77 | Ht 63.0 in | Wt 268.0 lb

## 2016-10-14 DIAGNOSIS — I350 Nonrheumatic aortic (valve) stenosis: Secondary | ICD-10-CM | POA: Diagnosis not present

## 2016-10-14 DIAGNOSIS — Z888 Allergy status to other drugs, medicaments and biological substances status: Secondary | ICD-10-CM | POA: Diagnosis not present

## 2016-10-14 DIAGNOSIS — G4733 Obstructive sleep apnea (adult) (pediatric): Secondary | ICD-10-CM | POA: Insufficient documentation

## 2016-10-14 DIAGNOSIS — Z801 Family history of malignant neoplasm of trachea, bronchus and lung: Secondary | ICD-10-CM | POA: Diagnosis not present

## 2016-10-14 DIAGNOSIS — Z8042 Family history of malignant neoplasm of prostate: Secondary | ICD-10-CM | POA: Diagnosis not present

## 2016-10-14 DIAGNOSIS — Z825 Family history of asthma and other chronic lower respiratory diseases: Secondary | ICD-10-CM | POA: Insufficient documentation

## 2016-10-14 DIAGNOSIS — M1990 Unspecified osteoarthritis, unspecified site: Secondary | ICD-10-CM | POA: Insufficient documentation

## 2016-10-14 DIAGNOSIS — I10 Essential (primary) hypertension: Secondary | ICD-10-CM | POA: Insufficient documentation

## 2016-10-14 DIAGNOSIS — I4891 Unspecified atrial fibrillation: Secondary | ICD-10-CM | POA: Diagnosis present

## 2016-10-14 DIAGNOSIS — Z880 Allergy status to penicillin: Secondary | ICD-10-CM | POA: Insufficient documentation

## 2016-10-14 DIAGNOSIS — Z86718 Personal history of other venous thrombosis and embolism: Secondary | ICD-10-CM | POA: Insufficient documentation

## 2016-10-14 DIAGNOSIS — Z86010 Personal history of colonic polyps: Secondary | ICD-10-CM | POA: Insufficient documentation

## 2016-10-14 DIAGNOSIS — K859 Acute pancreatitis without necrosis or infection, unspecified: Secondary | ICD-10-CM | POA: Diagnosis not present

## 2016-10-14 DIAGNOSIS — Z79899 Other long term (current) drug therapy: Secondary | ICD-10-CM | POA: Insufficient documentation

## 2016-10-14 DIAGNOSIS — Z8249 Family history of ischemic heart disease and other diseases of the circulatory system: Secondary | ICD-10-CM | POA: Diagnosis not present

## 2016-10-14 DIAGNOSIS — E669 Obesity, unspecified: Secondary | ICD-10-CM | POA: Insufficient documentation

## 2016-10-14 DIAGNOSIS — E7800 Pure hypercholesterolemia, unspecified: Secondary | ICD-10-CM | POA: Diagnosis not present

## 2016-10-14 DIAGNOSIS — H8110 Benign paroxysmal vertigo, unspecified ear: Secondary | ICD-10-CM | POA: Insufficient documentation

## 2016-10-14 DIAGNOSIS — Z807 Family history of other malignant neoplasms of lymphoid, hematopoietic and related tissues: Secondary | ICD-10-CM | POA: Diagnosis not present

## 2016-10-14 DIAGNOSIS — Z808 Family history of malignant neoplasm of other organs or systems: Secondary | ICD-10-CM | POA: Diagnosis not present

## 2016-10-14 DIAGNOSIS — I480 Paroxysmal atrial fibrillation: Secondary | ICD-10-CM

## 2016-10-14 DIAGNOSIS — Z90710 Acquired absence of both cervix and uterus: Secondary | ICD-10-CM | POA: Insufficient documentation

## 2016-10-14 DIAGNOSIS — I2510 Atherosclerotic heart disease of native coronary artery without angina pectoris: Secondary | ICD-10-CM | POA: Diagnosis not present

## 2016-10-14 NOTE — Progress Notes (Signed)
.aPatient ID: Tracy Miles, female   DOB: 1934-01-28, 83 y.o.   MRN: [id number]     Primary Care Physician: [name], MD Referring Physician: Dr. Lillette Boxer Neitzke is a 83 y.o. female with a h/o  PAF on tikosyn for f/u. She returns today after being set up for cardioversion for persistent afib. However, when she presented for cardioversion, she had returned to [place] [facility]. She has since bought a Fitbit so they she can track heart rate.  Today, she denies symptoms of palpitations, chest pain, shortness of breath, orthopnea, PND, lower extremity edema, dizziness, presyncope, syncope, or neurologic sequela. The patient is tolerating medications without difficulties and is otherwise without complaint today.   Past Medical History  Diagnosis Date  . Hypertension   . Heart murmur   . Coronary artery disease   . Atrial fibrillation ([place])   . Anemia   . Arthritis   . Colon polyp   . Personal history of DVT (deep vein thrombosis) X 2    "left"  . Hyperlipidemia   . Pancreatitis     post hysterectomy  . Sleep apnea   . Aortic stenosis     Very mild  . Hypercholesteremia   . OSA on CPAP   . PAF (paroxysmal atrial fibrillation) ([place])   . History of valvular heart disease     LEFT  . Chicken pox as a child  . Measles as a child  . Mumps child and teenager  . Allergy   . Obesity   . Unspecified constipation 11/16/2013  . Heme positive stool 11/16/2013  . DDD (degenerative disc disease) 11/16/2013  . Unspecified sleep apnea 11/19/2013  . Allergic state 11/19/2013  . OA (osteoarthritis) 11/19/2013    S/p L TKR  . Benign paroxysmal positional vertigo 12/17/2013  . Abnormal results of thyroid function studies 12/17/2013  . Iron deficiency anemia 11/13/2013  . Personal history of colonic polyps 02/25/2014  . Spinal stenosis   . Obstructive sleep apnea 02/22/2015  . Visit for monitoring Tikosyn therapy 08/11/2015  . Medicare annual wellness visit, subsequent 09/15/2015   Past Surgical  History  Procedure Laterality Date  . Tonsilectomy, adenoidectomy, bilateral myringotomy and tubes  child  . Lumbar laminectomy  40 yrs ago    "L3-4"  . Tubal ligation  83 years old  . Total abdominal hysterectomy  1995    had 2 tumors- benign  . Replacement total knee Left 2013  . Cyst removal hand Bilateral 1990's    "played to much golf"  . Meniscus repair Bilateral 2000 and 2010  . Appendectomy    . Tonsillectomy and adenoidectomy    . Joint replacement    . Back surgery    . Cataract extraction w/ intraocular lens  implant, bilateral Bilateral 2016  . Dilation and curettage of uterus    . Cardioversion N/A 08/09/2015    Procedure: CARDIOVERSION;  Surgeon: Jerline Pain, MD;  Location: [facility];  Service: Cardiovascular;  Laterality: N/A;  . Eye surgery  2016    cataracts    Current Outpatient Prescriptions  Medication Sig Dispense Refill  . acetaminophen (TYLENOL) 650 MG CR tablet Take 1,300 mg by mouth every evening.    Marland Kitchen apixaban (ELIQUIS) 5 MG TABS tablet Take 1 tablet (5 mg total) by mouth 2 (two) times daily. 180 tablet 2  . atorvastatin (LIPITOR) 20 MG tablet Take 1 tablet (20 mg total) by mouth daily. 90 tablet 2  . bumetanide (BUMEX) 1 MG tablet TAKE  ONE (1) TABLET BY MOUTH EVERY DAY 90 tablet 0  . calcium carbonate (OS-CAL) 600 MG TABS tablet Take 600 mg by mouth 2 (two) times daily with a meal.    . diltiazem (CARDIZEM CD) 240 MG 24 hr capsule Take 240 mg by mouth daily.    Marland Kitchen dofetilide (TIKOSYN) 250 MCG capsule Take 1 capsule (250 mcg total) by mouth 2 (two) times daily. 60 capsule 3  . Lactobacillus (PROBIOTIC ACIDOPHILUS) TABS Take 1 tablet by mouth daily. Reported on 03/04/2016    . lisinopril (PRINIVIL,ZESTRIL) 10 MG tablet Take 1 tablet (10 mg total) by mouth daily. 90 tablet 2  . loratadine (CLARITIN) 10 MG tablet Take 10 mg by mouth daily.     . potassium chloride (KLOR-CON 10) 10 MEQ tablet TAKE ONE (1) TABLET EACH DAY 90 tablet 2  . psyllium (REGULOID)  0.52 g capsule Take 0.52 g by mouth daily.     No current facility-administered medications for this encounter.    Allergies  Allergen Reactions  . Gabapentin Swelling  . Lactose Intolerance (Gi) Other (See Comments)    Bothers her stomach  . Zebeta [Bisoprolol Fumarate] Nausea Only  . Penicillins Rash    Social History   Social History  . Marital Status: Widowed    Spouse Name: N/A  . Number of Children: 3  . Years of Education: N/A   Occupational History  . retired Pharmacist, hospital    Social History Main Topics  . Smoking status: Never Smoker   . Smokeless tobacco: Never Used     Comment: never used tobacco  . Alcohol Use: No  . Drug Use: No  . Sexual Activity: No     Comment: [place], low sodium diet   Other Topics Concern  . Not on file   Social History Narrative    Family History  Problem Relation Age of Onset  . Heart attack Mother 14  . Hyperlipidemia Mother     ?  Marland Kitchen Dementia Mother   . Pernicious anemia Mother   . COPD Father     smoker  . Cancer Father 41    prostate  . Heart disease Brother     quadruple bipass surgery  . Hyperlipidemia Brother   . Hypertension Brother   . Diabetes Maternal Grandmother     type 2  . Pernicious anemia Maternal Grandmother   . Colon cancer Neg Hx   . Pancreatic cancer Neg Hx   . Stomach cancer Neg Hx   . Throat cancer Neg Hx   . Liver disease Neg Hx   . Gout Son   . Atrial fibrillation Son   . Hyperlipidemia Son   . Cancer Maternal Grandfather     liver  . Cancer Paternal Grandmother     lung- doesn't think she smokes?  . Stroke Paternal Grandfather   . Cancer Son 50    non hodgin's lymphoma  . Gout Son   . Hyperlipidemia Son   . Sleep apnea Son     ROS- All systems are reviewed and negative except as per the HPI above  Physical Exam: Filed Vitals:   03/04/16 1351  BP: 138/74  Pulse: 75  Height: 5' 3.5" (1.613 m)  Weight: 260 lb 12.8 oz (118.298 kg)    GEN- The patient is well  appearing, alert and oriented x 3 today.   Head- normocephalic, atraumatic Eyes-  Sclera clear, conjunctiva pink Ears- hearing intact Oropharynx- clear Neck- supple, no JVP Lymph- no  cervical lymphadenopathy Lungs- Clear to ausculation bilaterally, normal work of breathing Heart- Regular rate and rhythm, no murmurs, rubs or gallops, PMI not laterally displaced GI- soft, NT, ND, + BS Extremities- no clubbing, cyanosis, or edema MS- no significant deformity or atrophy Skin- no rash or lesion Psych- euthymic mood, full affect Neuro- strength and sensation are intact  EKG- NSR at 77 bpm, Pr int 180 ms, QRS int 76 ms, Qtc 445 ms Epic records reviewed   Assessment and Plan: 1. Afib Spontaneous return to SR Doing well on tikosyn, continue 250 mg bid Continue daily cardizem with 30 mg as needed for bouts of afib Continue apixaban  F/u in 3 months   Butch Penny C. Brendan Gruwell, [facility] [address] [place], [place] [address] [phone]

## 2016-10-30 ENCOUNTER — Other Ambulatory Visit: Payer: Self-pay | Admitting: Family Medicine

## 2016-10-30 MED ORDER — BUMETANIDE 1 MG PO TABS: 1.0000 mg | ORAL_TABLET | Freq: Every day | ORAL | 0 refills | Status: DC

## 2016-11-20 ENCOUNTER — Ambulatory Visit: Payer: Medicare Other | Admitting: Family Medicine

## 2016-12-11 ENCOUNTER — Other Ambulatory Visit (HOSPITAL_COMMUNITY): Payer: Self-pay | Admitting: *Deleted

## 2016-12-11 MED ORDER — DILTIAZEM HCL ER COATED BEADS 120 MG PO CP24: 120.0000 mg | ORAL_CAPSULE | Freq: Every day | ORAL | 6 refills | Status: DC

## 2016-12-29 ENCOUNTER — Encounter: Payer: Self-pay | Admitting: Family Medicine

## 2016-12-29 ENCOUNTER — Ambulatory Visit (INDEPENDENT_AMBULATORY_CARE_PROVIDER_SITE_OTHER): Payer: Medicare Other | Admitting: Family Medicine

## 2016-12-29 VITALS — BP 148/88 | HR 87 | Temp 98.3°F | Resp 18 | Wt 268.2 lb

## 2016-12-29 DIAGNOSIS — I5032 Chronic diastolic (congestive) heart failure: Secondary | ICD-10-CM

## 2016-12-29 DIAGNOSIS — I481 Persistent atrial fibrillation: Secondary | ICD-10-CM

## 2016-12-29 DIAGNOSIS — I10 Essential (primary) hypertension: Secondary | ICD-10-CM | POA: Diagnosis not present

## 2016-12-29 DIAGNOSIS — G4733 Obstructive sleep apnea (adult) (pediatric): Secondary | ICD-10-CM | POA: Diagnosis not present

## 2016-12-29 DIAGNOSIS — E782 Mixed hyperlipidemia: Secondary | ICD-10-CM | POA: Diagnosis not present

## 2016-12-29 DIAGNOSIS — M48061 Spinal stenosis, lumbar region without neurogenic claudication: Secondary | ICD-10-CM | POA: Diagnosis not present

## 2016-12-29 DIAGNOSIS — D509 Iron deficiency anemia, unspecified: Secondary | ICD-10-CM

## 2016-12-29 DIAGNOSIS — I4819 Other persistent atrial fibrillation: Secondary | ICD-10-CM

## 2016-12-29 DIAGNOSIS — T7840XD Allergy, unspecified, subsequent encounter: Secondary | ICD-10-CM

## 2016-12-29 DIAGNOSIS — E6609 Other obesity due to excess calories: Secondary | ICD-10-CM

## 2016-12-29 MED ORDER — LISINOPRIL 10 MG PO TABS: 10.0000 mg | ORAL_TABLET | Freq: Every day | ORAL | 1 refills | Status: DC

## 2016-12-29 MED ORDER — APIXABAN 5 MG PO TABS: ORAL_TABLET | ORAL | 1 refills | Status: DC

## 2016-12-29 MED ORDER — BUMETANIDE 1 MG PO TABS: 1.0000 mg | ORAL_TABLET | Freq: Every day | ORAL | 0 refills | Status: DC

## 2016-12-29 MED ORDER — ATORVASTATIN CALCIUM 20 MG PO TABS: 20.0000 mg | ORAL_TABLET | Freq: Every day | ORAL | 1 refills | Status: DC

## 2016-12-29 NOTE — Assessment & Plan Note (Signed)
Uses CPAP qhs with good rest

## 2016-12-29 NOTE — Assessment & Plan Note (Signed)
Encouraged heart healthy diet, increase exercise, avoid trans fats, consider a krill oil cap daily 

## 2016-12-29 NOTE — Progress Notes (Signed)
Pre visit review using our clinic review tool, if applicable. No additional management support is needed unless otherwise documented below in the visit note. 

## 2016-12-29 NOTE — Progress Notes (Signed)
Subjective:  I acted as a Education administrator for Dr. Charlett Blake. Tracy Miles, [place]  Patient ID: Tracy Miles, female    DOB: [birth year], 83 y.o.   MRN: [id number]  Chief Complaint  Patient presents with  . Follow-up  . Hypertension    HPI  Patient is in today for a 4 month follow up on hypertension and other medical concerns. She feels well today. No recent febrile illness or hospitalizations. Is doing well with ADLs at home. Is trying to stay active. Denies CP/palp/SOB/HA/congestion/fevers/GI or GU c/o. Taking meds as prescribed  Patient Care Team: [name], MD as PCP - General (Family Medicine) Clayborne Artist, NP as Nurse Practitioner (Cardiology) Rigoberto Noel, MD as Consulting Physician (Pulmonary Disease) Melrose Nakayama, MD as Consulting Physician (Orthopedic Surgery)   Past Medical History:  Diagnosis Date  . Anemia   . Aortic stenosis    Very mild  . Benign paroxysmal positional vertigo 12/17/2013  . Chicken pox as a child  . Colon polyp   . Coronary artery disease   . DDD (degenerative disc disease) 11/16/2013  . Hyperlipidemia   . Hypertension   . Iron deficiency anemia 11/13/2013  . Mumps child and teenager  . OA (osteoarthritis) 11/19/2013   S/p L TKR  . Obesity   . OSA on CPAP   . Pancreatitis    post hysterectomy  . Persistent atrial fibrillation ([place])   . Personal history of colonic polyps 02/25/2014  . Personal history of DVT (deep vein thrombosis) X 2   "left"  . Sleep apnea   . Spinal stenosis     Past Surgical History:  Procedure Laterality Date  . APPENDECTOMY    . CARDIOVERSION N/A 08/09/2015   Procedure: CARDIOVERSION;  Surgeon: Jerline Pain, MD;  Location: [facility];  Service: Cardiovascular;  Laterality: N/A;  . CATARACT EXTRACTION W/ INTRAOCULAR LENS  IMPLANT, BILATERAL Bilateral 2016  . CYST REMOVAL HAND Bilateral 1990's   "played to much golf"  . DILATION AND CURETTAGE OF UTERUS    . LUMBAR LAMINECTOMY  40 yrs ago   "L3-4"  . MENISCUS REPAIR  Bilateral 2000 and 2010  . REPLACEMENT TOTAL KNEE Left 2013  . TONSILECTOMY, ADENOIDECTOMY, BILATERAL MYRINGOTOMY AND TUBES  child  . TOTAL ABDOMINAL HYSTERECTOMY  1995   had 2 tumors- benign  . TUBAL LIGATION  83 years old    Family History  Problem Relation Age of Onset  . Heart attack Mother 67  . Hyperlipidemia Mother     ?  Marland Kitchen Dementia Mother   . Pernicious anemia Mother   . COPD Father     smoker  . Cancer Father 36    prostate  . Heart disease Brother     quadruple bipass surgery  . Hyperlipidemia Brother   . Hypertension Brother   . Diabetes Maternal Grandmother     type 2  . Pernicious anemia Maternal Grandmother   . Gout Son   . Atrial fibrillation Son   . Hyperlipidemia Son   . Cancer Maternal Grandfather     liver  . Cancer Paternal Grandmother     lung- doesn't think she smokes?  . Stroke Paternal Grandfather   . Cancer Son 50    non hodgin's lymphoma  . Gout Son   . Hyperlipidemia Son   . Sleep apnea Son   . Colon cancer Neg Hx   . Pancreatic cancer Neg Hx   . Stomach cancer Neg Hx   . Throat cancer Neg  Hx   . Liver disease Neg Hx     Social History   Social History  . Marital status: Widowed    Spouse name: N/A  . Number of children: 3  . Years of education: N/A   Occupational History  . retired Pharmacist, hospital    Social History Main Topics  . Smoking status: Never Smoker  . Smokeless tobacco: Never Used     Comment: never used tobacco  . Alcohol use No  . Drug use: No  . Sexual activity: No     Comment: [place], low sodium diet   Other Topics Concern  . Not on file   Social History Narrative  . No narrative on file    Outpatient Medications Prior to Visit  Medication Sig Dispense Refill  . acetaminophen (TYLENOL) 325 MG tablet Take 325 mg by mouth 2 (two) times daily.    . calcium carbonate (OS-CAL) 600 MG TABS tablet Take 600 mg by mouth 2 (two) times daily with a meal.    . diltiazem (CARDIZEM CD) 120 MG 24 hr  capsule Take 1 capsule (120 mg total) by mouth daily. 30 capsule 6  . diltiazem (CARDIZEM) 30 MG tablet Take 1 tablet every 4 hours AS NEEDED for heart rate >100 as long as blood pressure >100. 45 tablet 3  . dofetilide (TIKOSYN) 250 MCG capsule Take 1 capsule (250 mcg total) by mouth 2 (two) times daily. 60 capsule 3  . hydrocortisone cream 1 % Apply 1 application topically 4 (four) times daily. OTC    . Lactobacillus (PROBIOTIC ACIDOPHILUS) TABS Take 1 tablet by mouth daily. Reported on 03/04/2016    . loratadine (CLARITIN) 10 MG tablet Take 10 mg by mouth daily.     . potassium chloride (KLOR-CON 10) 10 MEQ tablet Take one tablet in the morning and two tablets in the pm (Patient taking differently: No sig reported) 90 tablet 6  . psyllium (REGULOID) 0.52 g capsule Take 0.52 g by mouth 2 (two) times daily.     Marland Kitchen atorvastatin (LIPITOR) 20 MG tablet Take 1 tablet (20 mg total) by mouth daily at 6 PM. 90 tablet 1  . bumetanide (BUMEX) 1 MG tablet Take 1 tablet (1 mg total) by mouth daily. 90 tablet 0  . ELIQUIS 5 MG TABS tablet TAKE ONE (1) TABLET BY MOUTH TWO (2) TIMES DAILY 180 tablet 1  . lisinopril (PRINIVIL,ZESTRIL) 10 MG tablet Take 1 tablet (10 mg total) by mouth daily. 90 tablet 1   No facility-administered medications prior to visit.      Review of Systems  Constitutional: Positive for malaise/fatigue. Negative for fever.  HENT: Negative for congestion.   Eyes: Negative for blurred vision.  Respiratory: Negative for shortness of breath.   Cardiovascular: Negative for chest pain, palpitations and leg swelling.  Gastrointestinal: Negative for abdominal pain, blood in stool and nausea.  Genitourinary: Negative for dysuria and frequency.  Musculoskeletal: Negative for falls.  Skin: Negative for rash.  Neurological: Negative for dizziness, loss of consciousness and headaches.  Endo/Heme/Allergies: Negative for environmental allergies.  Psychiatric/Behavioral: Negative for depression.  The patient is not nervous/anxious.        Objective:    Physical Exam  Constitutional: She is oriented to person, place, and time. She appears well-developed and well-nourished. No distress.  HENT:  Head: Normocephalic and atraumatic.  Nose: Nose normal.  Eyes: Right eye exhibits no discharge. Left eye exhibits no discharge.  Neck: Normal range of motion. Neck supple.  Cardiovascular: Normal rate and regular rhythm.   No murmur heard. Pulmonary/Chest: Effort normal and breath sounds normal.  Abdominal: Soft. Bowel sounds are normal. There is no tenderness.  Musculoskeletal: She exhibits no edema.  Neurological: She is alert and oriented to person, place, and time.  Skin: Skin is warm and dry.  Psychiatric: She has a normal mood and affect.  Nursing note and vitals reviewed.   BP (!) 148/88   Pulse 87   Temp 98.3 F (36.8 C) (Oral)   Resp 18   Wt 268 lb 3.2 oz (121.7 kg)   SpO2 98%   BMI 47.51 kg/m  Wt Readings from Last 3 Encounters:  12/29/16 268 lb 3.2 oz (121.7 kg)  10/14/16 268 lb (121.6 kg)  09/29/16 268 lb 6.4 oz (121.7 kg)   BP Readings from Last 3 Encounters:  12/30/16 (!) 148/88  10/14/16 (!) 146/74  09/29/16 120/70     Immunization History  Administered Date(s) Administered  . Influenza,inj,Quad PF,36+ Mos 04/30/2016  . Influenza-Unspecified 05/31/2013, 05/01/2014, 06/11/2015  . Pneumococcal Conjugate-13 04/02/2014  . Pneumococcal Polysaccharide-23 08/31/1998, 09/06/2015  . Tdap 07/31/2012    Health Maintenance  Topic Date Due  . INFLUENZA VACCINE  03/31/2017  . COLONOSCOPY  11/22/2018  . TETANUS/TDAP  07/31/2022  . DEXA SCAN  Completed  . PNA vac Low Risk Adult  Completed    Lab Results  Component Value Date   WBC 8.4 09/22/2016   HGB 13.3 09/22/2016   HCT 39.3 09/22/2016   PLT 234 09/22/2016   GLUCOSE 112 (H) 09/22/2016   CHOL 174 08/28/2016   TRIG 171.0 (H) 08/28/2016   HDL 51.70 08/28/2016   LDLCALC 88 08/28/2016   ALT 13  08/28/2016   AST 15 08/28/2016   NA 138 09/22/2016   K 4.3 09/22/2016   CL 104 09/22/2016   CREATININE 1.09 (H) 09/22/2016   BUN 23 (H) 09/22/2016   CO2 27 09/22/2016   TSH 2.79 08/28/2016   INR 1.55 (H) 08/06/2015    Lab Results  Component Value Date   TSH 2.79 08/28/2016   Lab Results  Component Value Date   WBC 8.4 09/22/2016   HGB 13.3 09/22/2016   HCT 39.3 09/22/2016   MCV 94.0 09/22/2016   PLT 234 09/22/2016   Lab Results  Component Value Date   NA 138 09/22/2016   K 4.3 09/22/2016   CO2 27 09/22/2016   GLUCOSE 112 (H) 09/22/2016   BUN 23 (H) 09/22/2016   CREATININE 1.09 (H) 09/22/2016   BILITOT 0.6 08/28/2016   ALKPHOS 83 08/28/2016   AST 15 08/28/2016   ALT 13 08/28/2016   PROT 6.9 08/28/2016   ALBUMIN 4.4 08/28/2016   CALCIUM 9.9 09/22/2016   ANIONGAP 7 09/22/2016   GFR 52.96 (L) 08/28/2016   Lab Results  Component Value Date   CHOL 174 08/28/2016   Lab Results  Component Value Date   HDL 51.70 08/28/2016   Lab Results  Component Value Date   LDLCALC 88 08/28/2016   Lab Results  Component Value Date   TRIG 171.0 (H) 08/28/2016   Lab Results  Component Value Date   CHOLHDL 3 08/28/2016   No results found for: HGBA1C       Assessment & Plan:   Problem List Items Addressed This Visit    Atrial fibrillation ([place])    No recent flares on Tikosyn      Relevant Medications   apixaban (ELIQUIS) 5 MG TABS tablet   atorvastatin (LIPITOR)  20 MG tablet   bumetanide (BUMEX) 1 MG tablet   lisinopril (PRINIVIL,ZESTRIL) 10 MG tablet   Other Relevant Orders   CBC   Comprehensive metabolic panel   TSH   Essential hypertension    Mildly elevated but patient asymptomatic and following with cardiology. Will continue to monitor. She reports better numbers at home. She will report any concerning syptoms or persistently elevated numbers.       Relevant Medications   apixaban (ELIQUIS) 5 MG TABS tablet   atorvastatin (LIPITOR) 20 MG tablet    bumetanide (BUMEX) 1 MG tablet   lisinopril (PRINIVIL,ZESTRIL) 10 MG tablet   Hyperlipidemia, mixed    Encouraged heart healthy diet, increase exercise, avoid trans fats, consider a krill oil cap daily      Relevant Medications   apixaban (ELIQUIS) 5 MG TABS tablet   atorvastatin (LIPITOR) 20 MG tablet   bumetanide (BUMEX) 1 MG tablet   lisinopril (PRINIVIL,ZESTRIL) 10 MG tablet   Other Relevant Orders   Lipid panel   Iron deficiency anemia    Increase leafy greens, consider increased lean red meat and using cast iron cookware. Continue to monitor, report any concerns      Relevant Orders   CBC   Obesity    Encouraged DASH diet, decrease po intake and increase exercise as tolerated. Needs 7-8 hours of sleep nightly. Avoid trans fats, eat small, frequent meals every 4-5 hours with lean proteins, complex carbs and healthy fats. Minimize simple carbs, bariatrics referral       Allergic state    Flared presently try increasing Claritin to bid prn consider Flonase      Spinal stenosis of lumbar region    Still struggles with daily back pain, gets relief in a pool otherwise, no response to Lidocaine patches. Try Menthol patches      Obstructive sleep apnea    Uses CPAP qhs with good rest      Chronic diastolic HF (heart failure) ([facility])    No recent exacerbations or hospitalizations      Relevant Medications   apixaban (ELIQUIS) 5 MG TABS tablet   atorvastatin (LIPITOR) 20 MG tablet   bumetanide (BUMEX) 1 MG tablet   lisinopril (PRINIVIL,ZESTRIL) 10 MG tablet    Other Visit Diagnoses    Hypertension, unspecified type    -  Primary   Relevant Medications   apixaban (ELIQUIS) 5 MG TABS tablet   atorvastatin (LIPITOR) 20 MG tablet   bumetanide (BUMEX) 1 MG tablet   lisinopril (PRINIVIL,ZESTRIL) 10 MG tablet   Other Relevant Orders   CBC   Comprehensive metabolic panel   TSH      I have changed Tracy Miles's ELIQUIS to apixaban. I am also having her maintain her calcium  carbonate, loratadine, PROBIOTIC ACIDOPHILUS, psyllium, hydrocortisone cream, acetaminophen, potassium chloride, diltiazem, dofetilide, diltiazem, atorvastatin, bumetanide, and lisinopril.  Meds ordered this encounter  Medications  . apixaban (ELIQUIS) 5 MG TABS tablet    Sig: TAKE ONE (1) TABLET BY MOUTH TWO (2) TIMES DAILY    Dispense:  180 tablet    Refill:  1  . atorvastatin (LIPITOR) 20 MG tablet    Sig: Take 1 tablet (20 mg total) by mouth daily at 6 PM.    Dispense:  90 tablet    Refill:  1  . bumetanide (BUMEX) 1 MG tablet    Sig: Take 1 tablet (1 mg total) by mouth daily.    Dispense:  90 tablet    Refill:  0  .  lisinopril (PRINIVIL,ZESTRIL) 10 MG tablet    Sig: Take 1 tablet (10 mg total) by mouth daily.    Dispense:  90 tablet    Refill:  1    CMA served as Education administrator during this visit. History, Physical and Plan performed by medical provider. Documentation and orders reviewed and attested to.  [name], MD

## 2016-12-29 NOTE — Assessment & Plan Note (Signed)
Flared presently try increasing Claritin to bid prn consider Flonase

## 2016-12-29 NOTE — Assessment & Plan Note (Signed)
No recent flares on Tikosyn

## 2016-12-29 NOTE — Assessment & Plan Note (Signed)
Still struggles with daily back pain, gets relief in a pool otherwise, no response to Lidocaine patches. Try Menthol patches

## 2016-12-29 NOTE — Assessment & Plan Note (Signed)
Encouraged DASH diet, decrease po intake and increase exercise as tolerated. Needs 7-8 hours of sleep nightly. Avoid trans fats, eat small, frequent meals every 4-5 hours with lean proteins, complex carbs and healthy fats. Minimize simple carbs, bariatrics referral

## 2016-12-29 NOTE — Assessment & Plan Note (Signed)
Increase leafy greens, consider increased lean red meat and using cast iron cookware. Continue to monitor, report any concerns 

## 2016-12-29 NOTE — Assessment & Plan Note (Signed)
No recent exacerbations or hospitalizations. 

## 2016-12-30 NOTE — Assessment & Plan Note (Signed)
Mildly elevated but patient asymptomatic and following with cardiology. Will continue to monitor. She reports better numbers at home. She will report any concerning syptoms or persistently elevated numbers.

## 2017-01-13 ENCOUNTER — Encounter (HOSPITAL_COMMUNITY): Payer: Self-pay | Admitting: Nurse Practitioner

## 2017-01-13 ENCOUNTER — Ambulatory Visit (HOSPITAL_COMMUNITY)
Admission: RE | Admit: 2017-01-13 | Discharge: 2017-01-13 | Disposition: A | Discharge status: Home / Self Care | Payer: Medicare Other | Source: Ambulatory Visit | Attending: Nurse Practitioner | Admitting: Nurse Practitioner

## 2017-01-13 VITALS — BP 136/84 | HR 80 | Ht 63.0 in | Wt 271.0 lb

## 2017-01-13 DIAGNOSIS — Z825 Family history of asthma and other chronic lower respiratory diseases: Secondary | ICD-10-CM | POA: Diagnosis not present

## 2017-01-13 DIAGNOSIS — H8110 Benign paroxysmal vertigo, unspecified ear: Secondary | ICD-10-CM | POA: Diagnosis not present

## 2017-01-13 DIAGNOSIS — Z880 Allergy status to penicillin: Secondary | ICD-10-CM | POA: Insufficient documentation

## 2017-01-13 DIAGNOSIS — Z807 Family history of other malignant neoplasms of lymphoid, hematopoietic and related tissues: Secondary | ICD-10-CM | POA: Diagnosis not present

## 2017-01-13 DIAGNOSIS — I480 Paroxysmal atrial fibrillation: Secondary | ICD-10-CM

## 2017-01-13 DIAGNOSIS — M1990 Unspecified osteoarthritis, unspecified site: Secondary | ICD-10-CM | POA: Diagnosis not present

## 2017-01-13 DIAGNOSIS — Z86718 Personal history of other venous thrombosis and embolism: Secondary | ICD-10-CM | POA: Insufficient documentation

## 2017-01-13 DIAGNOSIS — Z9889 Other specified postprocedural states: Secondary | ICD-10-CM | POA: Diagnosis not present

## 2017-01-13 DIAGNOSIS — Z8249 Family history of ischemic heart disease and other diseases of the circulatory system: Secondary | ICD-10-CM | POA: Diagnosis not present

## 2017-01-13 DIAGNOSIS — Z888 Allergy status to other drugs, medicaments and biological substances status: Secondary | ICD-10-CM | POA: Insufficient documentation

## 2017-01-13 DIAGNOSIS — K859 Acute pancreatitis without necrosis or infection, unspecified: Secondary | ICD-10-CM | POA: Insufficient documentation

## 2017-01-13 DIAGNOSIS — Z79899 Other long term (current) drug therapy: Secondary | ICD-10-CM | POA: Diagnosis not present

## 2017-01-13 DIAGNOSIS — I350 Nonrheumatic aortic (valve) stenosis: Secondary | ICD-10-CM | POA: Insufficient documentation

## 2017-01-13 DIAGNOSIS — Z800 Family history of malignant neoplasm of digestive organs: Secondary | ICD-10-CM | POA: Insufficient documentation

## 2017-01-13 DIAGNOSIS — I10 Essential (primary) hypertension: Secondary | ICD-10-CM | POA: Insufficient documentation

## 2017-01-13 DIAGNOSIS — E7800 Pure hypercholesterolemia, unspecified: Secondary | ICD-10-CM | POA: Diagnosis not present

## 2017-01-13 DIAGNOSIS — Z90710 Acquired absence of both cervix and uterus: Secondary | ICD-10-CM | POA: Insufficient documentation

## 2017-01-13 DIAGNOSIS — E669 Obesity, unspecified: Secondary | ICD-10-CM | POA: Diagnosis not present

## 2017-01-13 DIAGNOSIS — Z801 Family history of malignant neoplasm of trachea, bronchus and lung: Secondary | ICD-10-CM | POA: Insufficient documentation

## 2017-01-13 DIAGNOSIS — Z86010 Personal history of colonic polyps: Secondary | ICD-10-CM | POA: Insufficient documentation

## 2017-01-13 DIAGNOSIS — Z833 Family history of diabetes mellitus: Secondary | ICD-10-CM | POA: Diagnosis not present

## 2017-01-13 DIAGNOSIS — I2510 Atherosclerotic heart disease of native coronary artery without angina pectoris: Secondary | ICD-10-CM | POA: Diagnosis not present

## 2017-01-13 DIAGNOSIS — G4733 Obstructive sleep apnea (adult) (pediatric): Secondary | ICD-10-CM | POA: Insufficient documentation

## 2017-01-13 LAB — BASIC METABOLIC PANEL
ANION GAP: 8 (ref 5–15)
BUN: 15 mg/dL (ref 6–20)
CHLORIDE: 102 mmol/L (ref 101–111)
CO2: 29 mmol/L (ref 22–32)
Calcium: 9.4 mg/dL (ref 8.9–10.3)
Creatinine, Ser: 0.98 mg/dL (ref 0.44–1.00)
GFR, EST NON AFRICAN AMERICAN: 53 mL/min — AB (ref >60)
Glucose, Bld: 104 mg/dL — ABNORMAL HIGH (ref 65–99)
POTASSIUM: 4 mmol/L (ref 3.5–5.1)
SODIUM: 139 mmol/L (ref 135–145)

## 2017-01-13 LAB — MAGNESIUM: MAGNESIUM: 2.2 mg/dL (ref 1.7–2.4)

## 2017-01-13 NOTE — Progress Notes (Addendum)
.aPatient ID: Tracy Miles, female   DOB: [birth year], 83 y.o.   MRN: [id number]     Primary Care Physician: [name], MD Referring Physician: Dr. Lillette Boxer Colledge is a 83 y.o. female with a h/o  PAF on tikosyn for f/u.  She reports about a 24 hour breakthrough episode of afib about every 4 months that she can get under control with prn 30 mg cardizem. She continues on eliquis 5 mg bid, appropriately dosed.   Today, she denies symptoms of palpitations, chest pain, shortness of breath, orthopnea, PND, lower extremity edema, dizziness, presyncope, syncope, or neurologic sequela. The patient is tolerating medications without difficulties and is otherwise without complaint today.   Past Medical History  Diagnosis Date  . Hypertension   . Heart murmur   . Coronary artery disease   . Atrial fibrillation ([place])   . Anemia   . Arthritis   . Colon polyp   . Personal history of DVT (deep vein thrombosis) X 2    "left"  . Hyperlipidemia   . Pancreatitis     post hysterectomy  . Sleep apnea   . Aortic stenosis     Very mild  . Hypercholesteremia   . OSA on CPAP   . PAF (paroxysmal atrial fibrillation) ([place])   . History of valvular heart disease     LEFT  . Chicken pox as a child  . Measles as a child  . Mumps child and teenager  . Allergy   . Obesity   . Unspecified constipation 11/16/2013  . Heme positive stool 11/16/2013  . DDD (degenerative disc disease) 11/16/2013  . Unspecified sleep apnea 11/19/2013  . Allergic state 11/19/2013  . OA (osteoarthritis) 11/19/2013    S/p L TKR  . Benign paroxysmal positional vertigo 12/17/2013  . Abnormal results of thyroid function studies 12/17/2013  . Iron deficiency anemia 11/13/2013  . Personal history of colonic polyps 02/25/2014  . Spinal stenosis   . Obstructive sleep apnea 02/22/2015  . Visit for monitoring Tikosyn therapy 08/11/2015  . Medicare annual wellness visit, subsequent 09/15/2015   Past Surgical History  Procedure  Laterality Date  . Tonsilectomy, adenoidectomy, bilateral myringotomy and tubes  child  . Lumbar laminectomy  40 yrs ago    "L3-4"  . Tubal ligation  83 years old  . Total abdominal hysterectomy  1995    had 2 tumors- benign  . Replacement total knee Left 2013  . Cyst removal hand Bilateral 1990's    "played to much golf"  . Meniscus repair Bilateral 2000 and 2010  . Appendectomy    . Tonsillectomy and adenoidectomy    . Joint replacement    . Back surgery    . Cataract extraction w/ intraocular lens  implant, bilateral Bilateral 2016  . Dilation and curettage of uterus    . Cardioversion N/A 08/09/2015    Procedure: CARDIOVERSION;  Surgeon: Jerline Pain, MD;  Location: [facility];  Service: Cardiovascular;  Laterality: N/A;  . Eye surgery  2016    cataracts    Current Outpatient Prescriptions  Medication Sig Dispense Refill  . acetaminophen (TYLENOL) 650 MG CR tablet Take 1,300 mg by mouth every evening.    Marland Kitchen apixaban (ELIQUIS) 5 MG TABS tablet Take 1 tablet (5 mg total) by mouth 2 (two) times daily. 180 tablet 2  . atorvastatin (LIPITOR) 20 MG tablet Take 1 tablet (20 mg total) by mouth daily. 90 tablet 2  . bumetanide (BUMEX) 1 MG tablet TAKE  ONE (1) TABLET BY MOUTH EVERY DAY 90 tablet 0  . calcium carbonate (OS-CAL) 600 MG TABS tablet Take 600 mg by mouth 2 (two) times daily with a meal.    . diltiazem (CARDIZEM CD) 240 MG 24 hr capsule Take 240 mg by mouth daily.    Marland Kitchen dofetilide (TIKOSYN) 250 MCG capsule Take 1 capsule (250 mcg total) by mouth 2 (two) times daily. 60 capsule 3  . Lactobacillus (PROBIOTIC ACIDOPHILUS) TABS Take 1 tablet by mouth daily. Reported on 03/04/2016    . lisinopril (PRINIVIL,ZESTRIL) 10 MG tablet Take 1 tablet (10 mg total) by mouth daily. 90 tablet 2  . loratadine (CLARITIN) 10 MG tablet Take 10 mg by mouth daily.     . potassium chloride (KLOR-CON 10) 10 MEQ tablet TAKE ONE (1) TABLET EACH DAY 90 tablet 2  . psyllium (REGULOID) 0.52 g capsule Take  0.52 g by mouth daily.     No current facility-administered medications for this encounter.    Allergies  Allergen Reactions  . Gabapentin Swelling  . Lactose Intolerance (Gi) Other (See Comments)    Bothers her stomach  . Zebeta [Bisoprolol Fumarate] Nausea Only  . Penicillins Rash    Social History   Social History  . Marital Status: Widowed    Spouse Name: N/A  . Number of Children: 3  . Years of Education: N/A   Occupational History  . retired Pharmacist, hospital    Social History Main Topics  . Smoking status: Never Smoker   . Smokeless tobacco: Never Used     Comment: never used tobacco  . Alcohol Use: No  . Drug Use: No  . Sexual Activity: No     Comment: lives at [place], low sodium diet   Other Topics Concern  . Not on file   Social History Narrative    Family History  Problem Relation Age of Onset  . Heart attack Mother 62  . Hyperlipidemia Mother     ?  Marland Kitchen Dementia Mother   . Pernicious anemia Mother   . COPD Father     smoker  . Cancer Father 39    prostate  . Heart disease Brother     quadruple bipass surgery  . Hyperlipidemia Brother   . Hypertension Brother   . Diabetes Maternal Grandmother     type 2  . Pernicious anemia Maternal Grandmother   . Colon cancer Neg Hx   . Pancreatic cancer Neg Hx   . Stomach cancer Neg Hx   . Throat cancer Neg Hx   . Liver disease Neg Hx   . Gout Son   . Atrial fibrillation Son   . Hyperlipidemia Son   . Cancer Maternal Grandfather     liver  . Cancer Paternal Grandmother     lung- doesn't think she smokes?  . Stroke Paternal Grandfather   . Cancer Son 50    non hodgin's lymphoma  . Gout Son   . Hyperlipidemia Son   . Sleep apnea Son     ROS- All systems are reviewed and negative except as per the HPI above  Physical Exam: Filed Vitals:   03/04/16 1351  BP: 138/74  Pulse: 75  Height: 5' 3.5" (1.613 m)  Weight: 260 lb 12.8 oz (118.298 kg)    GEN- The patient is well appearing, alert and  oriented x 3 today.   Head- normocephalic, atraumatic Eyes-  Sclera clear, conjunctiva pink Ears- hearing intact Oropharynx- clear Neck- supple, no JVP Lymph- no  cervical lymphadenopathy Lungs- Clear to ausculation bilaterally, normal work of breathing Heart- Regular rate and rhythm, no murmurs, rubs or gallops, PMI not laterally displaced GI- soft, NT, ND, + BS Extremities- no clubbing, cyanosis, or edema MS- no significant deformity or atrophy Skin- no rash or lesion Psych- euthymic mood, full affect Neuro- strength and sensation are intact  EKG- NSR at 80 bpm, Pr int 172 ms, QRS int 76 ms, Qtc 449 ms Epic records reviewed   Assessment and Plan: 1. Afib Doing well on tikosyn, continue 250 mg bid, fairly low afib burden Continue daily cardizem with 30 mg as needed for bouts of afib Continue apixaban Bmet/mag pending  F/u in 4 months  PCP in June as scheduled   Butch Penny C. Rulon Abdalla, [facility] [address] [place], [place] [address] [phone]

## 2017-02-01 ENCOUNTER — Other Ambulatory Visit (INDEPENDENT_AMBULATORY_CARE_PROVIDER_SITE_OTHER): Payer: Medicare Other

## 2017-02-01 DIAGNOSIS — D509 Iron deficiency anemia, unspecified: Secondary | ICD-10-CM | POA: Diagnosis not present

## 2017-02-01 DIAGNOSIS — I4819 Other persistent atrial fibrillation: Secondary | ICD-10-CM

## 2017-02-01 DIAGNOSIS — I10 Essential (primary) hypertension: Secondary | ICD-10-CM

## 2017-02-01 DIAGNOSIS — E782 Mixed hyperlipidemia: Secondary | ICD-10-CM

## 2017-02-01 DIAGNOSIS — I481 Persistent atrial fibrillation: Secondary | ICD-10-CM | POA: Diagnosis not present

## 2017-02-01 LAB — COMPREHENSIVE METABOLIC PANEL
ALK PHOS: 78 U/L (ref 39–117)
ALT: 12 U/L (ref 0–35)
AST: 15 U/L (ref 0–37)
Albumin: 4.4 g/dL (ref 3.5–5.2)
BILIRUBIN TOTAL: 0.7 mg/dL (ref 0.2–1.2)
BUN: 22 mg/dL (ref 6–23)
CALCIUM: 9.9 mg/dL (ref 8.4–10.5)
CO2: 29 mEq/L (ref 19–32)
CREATININE: 1.07 mg/dL (ref 0.40–1.20)
Chloride: 101 mEq/L (ref 96–112)
GFR: 52.33 mL/min — AB (ref >60.00)
GLUCOSE: 105 mg/dL — AB (ref 70–99)
Potassium: 4.5 mEq/L (ref 3.5–5.1)
Sodium: 138 mEq/L (ref 135–145)
TOTAL PROTEIN: 7.1 g/dL (ref 6.0–8.3)

## 2017-02-01 LAB — LIPID PANEL
Cholesterol: 167 mg/dL (ref 0–200)
HDL: 46.6 mg/dL (ref >39.00)
LDL CALC: 85 mg/dL (ref 0–99)
NonHDL: 120.76
TRIGLYCERIDES: 181 mg/dL — AB (ref 0.0–149.0)
Total CHOL/HDL Ratio: 4
VLDL: 36.2 mg/dL (ref 0.0–40.0)

## 2017-02-01 LAB — CBC
HCT: 39.1 % (ref 36.0–46.0)
HEMOGLOBIN: 13.5 g/dL (ref 12.0–15.0)
MCHC: 34.4 g/dL (ref 30.0–36.0)
MCV: 93 fl (ref 78.0–100.0)
PLATELETS: 234 10*3/uL (ref 150.0–400.0)
RBC: 4.2 Mil/uL (ref 3.87–5.11)
RDW: 12.9 % (ref 11.5–15.5)
WBC: 6.4 10*3/uL (ref 4.0–10.5)

## 2017-02-01 LAB — TSH: TSH: 1.95 u[IU]/mL (ref 0.35–4.50)

## 2017-02-04 ENCOUNTER — Other Ambulatory Visit (HOSPITAL_COMMUNITY): Payer: Self-pay | Admitting: *Deleted

## 2017-02-04 MED ORDER — DOFETILIDE 250 MCG PO CAPS: 250.0000 ug | ORAL_CAPSULE | Freq: Two times a day (BID) | ORAL | 6 refills | Status: DC

## 2017-02-08 ENCOUNTER — Other Ambulatory Visit (HOSPITAL_COMMUNITY): Payer: Self-pay | Admitting: *Deleted

## 2017-02-08 MED ORDER — DOFETILIDE 250 MCG PO CAPS: 250.0000 ug | ORAL_CAPSULE | Freq: Two times a day (BID) | ORAL | 6 refills | Status: DC

## 2017-03-18 ENCOUNTER — Other Ambulatory Visit (HOSPITAL_COMMUNITY): Payer: Self-pay | Admitting: *Deleted

## 2017-03-18 MED ORDER — POTASSIUM CHLORIDE ER 10 MEQ PO TBCR: EXTENDED_RELEASE_TABLET | ORAL | 6 refills | Status: DC

## 2017-03-29 ENCOUNTER — Other Ambulatory Visit: Payer: Self-pay | Admitting: Family Medicine

## 2017-03-29 DIAGNOSIS — Z1231 Encounter for screening mammogram for malignant neoplasm of breast: Secondary | ICD-10-CM

## 2017-04-29 ENCOUNTER — Encounter: Payer: Self-pay | Admitting: Adult Health

## 2017-04-29 ENCOUNTER — Ambulatory Visit (INDEPENDENT_AMBULATORY_CARE_PROVIDER_SITE_OTHER): Payer: Medicare Other | Admitting: Adult Health

## 2017-04-29 DIAGNOSIS — G4733 Obstructive sleep apnea (adult) (pediatric): Secondary | ICD-10-CM | POA: Diagnosis not present

## 2017-04-29 DIAGNOSIS — E6609 Other obesity due to excess calories: Secondary | ICD-10-CM

## 2017-04-29 NOTE — Assessment & Plan Note (Signed)
Well controlled on BIPAP   Plan  Patient Instructions  Great job with BIPAP  Keep up good work .  Work on weight loss Do not drive if sleepy.   follow up Dr. Elsworth Soho  In 1 year.

## 2017-04-29 NOTE — Assessment & Plan Note (Signed)
Wt loss  

## 2017-04-29 NOTE — Progress Notes (Signed)
@Patient  ID: Tracy Miles, female    DOB: 1933-09-10, 83 y.o.   MRN: [id number]  Chief Complaint  Patient presents with  . Follow-up    OSA     Referring provider: [name], MD  HPI: 83 year old female followed for obstructive sleep apnea on BiPAP at bedtime  A FIb hx    TEST  PSG 03/07/15 >had trouble sleeping due to back pain (total sleep time was only 243 min ) AHI 10.4./hr (AHI during REM 74.7 /hr ) Low sat 84%. Severe PLMS.   04/29/2017 Follow up : OSA  Patient presents for a one-year follow-up for sleep apnea. Patient is on BiPAP at bedtime. She says she is doing very well on BiPAP. She feels rested with no significant daytime sleepiness. Download shows excellent compliance with average usage around 8 hours. She is on IPAP 16, EPAP 12 cm H2O. AHI is 1.7. Minimum leaks.   Immunization History  Administered Date(s) Administered  . Influenza,inj,Quad PF,6+ Mos 04/30/2016  . Influenza-Unspecified 05/31/2013, 05/01/2014, 06/11/2015  . Pneumococcal Conjugate-13 04/02/2014  . Pneumococcal Polysaccharide-23 08/31/1998, 09/06/2015  . Tdap 07/31/2012    Past Medical History:  Diagnosis Date  . Anemia   . Aortic stenosis    Very mild  . Benign paroxysmal positional vertigo 12/17/2013  . Chicken pox as a child  . Colon polyp   . Coronary artery disease   . DDD (degenerative disc disease) 11/16/2013  . Hyperlipidemia   . Hypertension   . Iron deficiency anemia 11/13/2013  . Mumps child and teenager  . OA (osteoarthritis) 11/19/2013   S/p L TKR  . Obesity   . OSA on CPAP   . Pancreatitis    post hysterectomy  . Persistent atrial fibrillation ([place])   . Personal history of colonic polyps 02/25/2014  . Personal history of DVT (deep vein thrombosis) X 2   "left"  . Sleep apnea   . Spinal stenosis     Tobacco History: History  Smoking Status  . Never Smoker  Smokeless Tobacco  . Never Used    Comment: never used tobacco   Counseling given: Not  Answered   Outpatient Encounter Prescriptions as of 04/29/2017  Medication Sig  . acetaminophen (TYLENOL) 325 MG tablet Take 325 mg by mouth 2 (two) times daily.  Marland Kitchen apixaban (ELIQUIS) 5 MG TABS tablet TAKE ONE (1) TABLET BY MOUTH TWO (2) TIMES DAILY  . atorvastatin (LIPITOR) 20 MG tablet Take 1 tablet (20 mg total) by mouth daily at 6 PM.  . bumetanide (BUMEX) 1 MG tablet Take 1 tablet (1 mg total) by mouth daily.  . calcium carbonate (OS-CAL) 600 MG TABS tablet Take 600 mg by mouth 2 (two) times daily with a meal.  . diltiazem (CARDIZEM CD) 120 MG 24 hr capsule Take 1 capsule (120 mg total) by mouth daily.  Marland Kitchen diltiazem (CARDIZEM) 30 MG tablet Take 1 tablet every 4 hours AS NEEDED for heart rate >100 as long as blood pressure >100.  Marland Kitchen dofetilide (TIKOSYN) 250 MCG capsule Take 1 capsule (250 mcg total) by mouth 2 (two) times daily.  Marland Kitchen lisinopril (PRINIVIL,ZESTRIL) 10 MG tablet Take 1 tablet (10 mg total) by mouth daily.  Marland Kitchen loratadine (CLARITIN) 10 MG tablet Take 10 mg by mouth daily.   . potassium chloride (KLOR-CON 10) 10 MEQ tablet Take one tablet in the morning and two tablets in the pm  . psyllium (REGULOID) 0.52 g capsule Take 0.52 g by mouth 2 (two) times daily.   . [  DISCONTINUED] hydrocortisone cream 1 % Apply 1 application topically 4 (four) times daily. OTC  . [DISCONTINUED] Lactobacillus (PROBIOTIC ACIDOPHILUS) TABS Take 1 tablet by mouth daily. Reported on 03/04/2016   No facility-administered encounter medications on file as of 04/29/2017.      Review of Systems  Constitutional:   No  weight loss, night sweats,  Fevers, chills, + fatigue, or  lassitude.  HEENT:   No headaches,  Difficulty swallowing,  Tooth/dental problems, or  Sore throat,                No sneezing, itching, ear ache, nasal congestion, post nasal drip,   CV:  No chest pain,  Orthopnea, PND, swelling in lower extremities, anasarca, dizziness, palpitations, syncope.   GI  No heartburn, indigestion, abdominal  pain, nausea, vomiting, diarrhea, change in bowel habits, loss of appetite, bloody stools.   Resp: No shortness of breath with exertion or at rest.  No excess mucus, no productive cough,  No non-productive cough,  No coughing up of blood.  No change in color of mucus.  No wheezing.  No chest wall deformity  Skin: no rash or lesions.  GU: no dysuria, change in color of urine, no urgency or frequency.  No flank pain, no hematuria   MS:  No joint pain or swelling.  No decreased range of motion.  No back pain.    Physical Exam  BP (!) 143/85 (BP Location: Right Arm, Patient Position: Sitting, Cuff Size: Normal)   Pulse 75   Ht 5\' 3"  (1.6 m)   Wt 270 lb (122.5 kg)   SpO2 96%   BMI 47.83 kg/m   GEN: A/Ox3; pleasant , NAD, obese    HEENT:  [place]/AT,  EACs-clear, TMs-wnl, NOSE-clear, THROAT-clear, no lesions, no postnasal drip or exudate noted. Class 2-3 MP airway   NECK:  Supple w/ fair ROM; no JVD; normal carotid impulses w/o bruits; no thyromegaly or nodules palpated; no lymphadenopathy.    RESP  Clear  P & A; w/o, wheezes/ rales/ or rhonchi. no accessory muscle use, no dullness to percussion  CARD:  RRR, no m/r/g, tr  peripheral edema, pulses intact, no cyanosis or clubbing.  GI:   Soft & nt; nml bowel sounds; no organomegaly or masses detected.   Musco: Warm bil, no deformities or joint swelling noted.   Neuro: alert, no focal deficits noted.    Skin: Warm, no lesions or rashes    Lab Results:  CBC   ProBNP No results found for: PROBNP  Imaging: No results found.   Assessment & Plan:   Obstructive sleep apnea Well controlled on BIPAP   Plan  Patient Instructions  Great job with BIPAP  Keep up good work .  Work on weight loss Do not drive if sleepy.   follow up Dr. Elsworth Soho  In 1 year.       Obesity Wt loss      Rexene Edison, NP 04/29/2017

## 2017-04-29 NOTE — Patient Instructions (Signed)
Great job with BIPAP  Keep up good work .  Work on weight loss Do not drive if sleepy.   follow up Dr. Elsworth Soho  In 1 year.

## 2017-05-11 ENCOUNTER — Ambulatory Visit (INDEPENDENT_AMBULATORY_CARE_PROVIDER_SITE_OTHER): Payer: Medicare Other | Admitting: Family Medicine

## 2017-05-11 ENCOUNTER — Encounter: Payer: Self-pay | Admitting: Family Medicine

## 2017-05-11 VITALS — BP 120/64 | HR 86 | Temp 98.2°F | Resp 18 | Wt 271.0 lb

## 2017-05-11 DIAGNOSIS — G4733 Obstructive sleep apnea (adult) (pediatric): Secondary | ICD-10-CM

## 2017-05-11 DIAGNOSIS — Z23 Encounter for immunization: Secondary | ICD-10-CM | POA: Diagnosis not present

## 2017-05-11 DIAGNOSIS — I4819 Other persistent atrial fibrillation: Secondary | ICD-10-CM

## 2017-05-11 DIAGNOSIS — I10 Essential (primary) hypertension: Secondary | ICD-10-CM | POA: Diagnosis not present

## 2017-05-11 DIAGNOSIS — I481 Persistent atrial fibrillation: Secondary | ICD-10-CM | POA: Diagnosis not present

## 2017-05-11 DIAGNOSIS — E782 Mixed hyperlipidemia: Secondary | ICD-10-CM | POA: Diagnosis not present

## 2017-05-11 DIAGNOSIS — E6609 Other obesity due to excess calories: Secondary | ICD-10-CM | POA: Diagnosis not present

## 2017-05-11 LAB — COMPREHENSIVE METABOLIC PANEL
ALBUMIN: 4.5 g/dL (ref 3.5–5.2)
ALT: 13 U/L (ref 0–35)
AST: 17 U/L (ref 0–37)
Alkaline Phosphatase: 75 U/L (ref 39–117)
BILIRUBIN TOTAL: 0.6 mg/dL (ref 0.2–1.2)
BUN: 25 mg/dL — ABNORMAL HIGH (ref 6–23)
CALCIUM: 10 mg/dL (ref 8.4–10.5)
CO2: 28 meq/L (ref 19–32)
Chloride: 101 mEq/L (ref 96–112)
Creatinine, Ser: 1.07 mg/dL (ref 0.40–1.20)
GFR: 52.3 mL/min — ABNORMAL LOW (ref >60.00)
Glucose, Bld: 132 mg/dL — ABNORMAL HIGH (ref 70–99)
Potassium: 3.9 mEq/L (ref 3.5–5.1)
SODIUM: 140 meq/L (ref 135–145)
Total Protein: 7.2 g/dL (ref 6.0–8.3)

## 2017-05-11 LAB — LIPID PANEL
CHOLESTEROL: 173 mg/dL (ref 0–200)
HDL: 46 mg/dL (ref >39.00)
LDL Cholesterol: 98 mg/dL (ref 0–99)
NonHDL: 126.65
TRIGLYCERIDES: 144 mg/dL (ref 0.0–149.0)
Total CHOL/HDL Ratio: 4
VLDL: 28.8 mg/dL (ref 0.0–40.0)

## 2017-05-11 LAB — CBC
HCT: 40.2 % (ref 36.0–46.0)
Hemoglobin: 13.4 g/dL (ref 12.0–15.0)
MCHC: 33.4 g/dL (ref 30.0–36.0)
MCV: 96.5 fl (ref 78.0–100.0)
PLATELETS: 243 10*3/uL (ref 150.0–400.0)
RBC: 4.16 Mil/uL (ref 3.87–5.11)
RDW: 12.8 % (ref 11.5–15.5)
WBC: 6.2 10*3/uL (ref 4.0–10.5)

## 2017-05-11 LAB — TSH: TSH: 2.13 u[IU]/mL (ref 0.35–4.50)

## 2017-05-11 MED ORDER — BUMETANIDE 1 MG PO TABS: 1.0000 mg | ORAL_TABLET | Freq: Every day | ORAL | 0 refills | Status: DC

## 2017-05-11 NOTE — Assessment & Plan Note (Signed)
Well controlled, no changes to meds. Encouraged heart healthy diet such as the DASH diet and exercise as tolerated.  °

## 2017-05-11 NOTE — Assessment & Plan Note (Signed)
Tolerating statin, encouraged heart healthy diet, avoid trans fats, minimize simple carbs and saturated fats. Increase exercise as tolerated 

## 2017-05-11 NOTE — Patient Instructions (Signed)

## 2017-05-11 NOTE — Assessment & Plan Note (Signed)
Encouraged DASH diet, decrease po intake and increase exercise as tolerated. Needs 7-8 hours of sleep nightly. Avoid trans fats, eat small, frequent meals every 4-5 hours with lean proteins, complex carbs and healthy fats. Minimize simple carbs, bariatric referral declined

## 2017-05-11 NOTE — Progress Notes (Signed)
Subjective:  I acted as a Education administrator for Dr. Charlett Blake. Tracy Miles, [place]  Patient ID: Tracy Miles, female    DOB: March 01, 1934, 83 y.o.   MRN: [id number]  No chief complaint on file.   HPI  Patient is in today for a follow up. Patient is following up on her HTn, hyperlipidemia and other medical concerns. Presently she has patient has no acute concerns. No recent febrile illness or acute hospitalizations. Denies CP/palp/SOB/HA/congestion/fevers/GI or GU c/o. Taking meds as prescribed. Notes she is eating better and more vegetables.     Patient Care Team: [name], MD as PCP - General (Family Medicine) Clayborne Artist, NP as Nurse Practitioner (Cardiology) Rigoberto Noel, MD as Consulting Physician (Pulmonary Disease) Melrose Nakayama, MD as Consulting Physician (Orthopedic Surgery)   Past Medical History:  Diagnosis Date  . Anemia   . Aortic stenosis    Very mild  . Benign paroxysmal positional vertigo 12/17/2013  . Chicken pox as a child  . Colon polyp   . Coronary artery disease   . DDD (degenerative disc disease) 11/16/2013  . Hyperlipidemia   . Hypertension   . Iron deficiency anemia 11/13/2013  . Mumps child and teenager  . OA (osteoarthritis) 11/19/2013   S/p L TKR  . Obesity   . OSA on CPAP   . Pancreatitis    post hysterectomy  . Persistent atrial fibrillation ([place])   . Personal history of colonic polyps 02/25/2014  . Personal history of DVT (deep vein thrombosis) X 2   "left"  . Sleep apnea   . Spinal stenosis     Past Surgical History:  Procedure Laterality Date  . APPENDECTOMY    . CARDIOVERSION N/A 08/09/2015   Procedure: CARDIOVERSION;  Surgeon: Jerline Pain, MD;  Location: [facility];  Service: Cardiovascular;  Laterality: N/A;  . CATARACT EXTRACTION W/ INTRAOCULAR LENS  IMPLANT, BILATERAL Bilateral 2016  . CYST REMOVAL HAND Bilateral 1990's   "played to much golf"  . DILATION AND CURETTAGE OF UTERUS    . LUMBAR LAMINECTOMY  40 yrs ago   "L3-4"  .  MENISCUS REPAIR Bilateral 2000 and 2010  . REPLACEMENT TOTAL KNEE Left 2013  . TONSILECTOMY, ADENOIDECTOMY, BILATERAL MYRINGOTOMY AND TUBES  child  . TOTAL ABDOMINAL HYSTERECTOMY  1995   had 2 tumors- benign  . TUBAL LIGATION  83 years old    Family History  Problem Relation Age of Onset  . Heart attack Mother 29  . Hyperlipidemia Mother        ?  Marland Kitchen Dementia Mother   . Pernicious anemia Mother   . COPD Father        smoker  . Cancer Father 11       prostate  . Heart disease Brother        quadruple bipass surgery  . Hyperlipidemia Brother   . Hypertension Brother   . Diabetes Maternal Grandmother        type 2  . Pernicious anemia Maternal Grandmother   . Gout Son   . Atrial fibrillation Son   . Hyperlipidemia Son   . Cancer Maternal Grandfather        liver  . Cancer Paternal Grandmother        lung- doesn't think she smokes?  . Stroke Paternal Grandfather   . Cancer Son 50       non hodgin's lymphoma  . Gout Son   . Hyperlipidemia Son   . Sleep apnea Son   .  Colon cancer Neg Hx   . Pancreatic cancer Neg Hx   . Stomach cancer Neg Hx   . Throat cancer Neg Hx   . Liver disease Neg Hx     Social History   Social History  . Marital status: Widowed    Spouse name: N/A  . Number of children: 3  . Years of education: N/A   Occupational History  . retired Pharmacist, hospital    Social History Main Topics  . Smoking status: Never Smoker  . Smokeless tobacco: Never Used     Comment: never used tobacco  . Alcohol use No  . Drug use: No  . Sexual activity: No     Comment: lives at [place], low sodium diet   Other Topics Concern  . Not on file   Social History Narrative  . No narrative on file    Outpatient Medications Prior to Visit  Medication Sig Dispense Refill  . acetaminophen (TYLENOL) 325 MG tablet Take 325 mg by mouth 2 (two) times daily.    Marland Kitchen apixaban (ELIQUIS) 5 MG TABS tablet TAKE ONE (1) TABLET BY MOUTH TWO (2) TIMES DAILY 180 tablet 1  .  atorvastatin (LIPITOR) 20 MG tablet Take 1 tablet (20 mg total) by mouth daily at 6 PM. 90 tablet 1  . calcium carbonate (OS-CAL) 600 MG TABS tablet Take 600 mg by mouth 2 (two) times daily with a meal.    . diltiazem (CARDIZEM CD) 120 MG 24 hr capsule Take 1 capsule (120 mg total) by mouth daily. 30 capsule 6  . diltiazem (CARDIZEM) 30 MG tablet Take 1 tablet every 4 hours AS NEEDED for heart rate >100 as long as blood pressure >100. 45 tablet 3  . dofetilide (TIKOSYN) 250 MCG capsule Take 1 capsule (250 mcg total) by mouth 2 (two) times daily. 60 capsule 6  . lisinopril (PRINIVIL,ZESTRIL) 10 MG tablet Take 1 tablet (10 mg total) by mouth daily. 90 tablet 1  . loratadine (CLARITIN) 10 MG tablet Take 10 mg by mouth daily.     . potassium chloride (KLOR-CON 10) 10 MEQ tablet Take one tablet in the morning and two tablets in the pm 90 tablet 6  . psyllium (REGULOID) 0.52 g capsule Take 0.52 g by mouth 2 (two) times daily.     . bumetanide (BUMEX) 1 MG tablet Take 1 tablet (1 mg total) by mouth daily. 90 tablet 0   No facility-administered medications prior to visit.      Review of Systems  Constitutional: Negative for fever and malaise/fatigue.  HENT: Negative for congestion.   Eyes: Negative for blurred vision.  Respiratory: Negative for cough and shortness of breath.   Cardiovascular: Negative for chest pain, palpitations and leg swelling.  Gastrointestinal: Negative for vomiting.  Musculoskeletal: Negative for back pain.  Skin: Negative for rash.  Neurological: Negative for loss of consciousness and headaches.       Objective:    Physical Exam  Constitutional: She is oriented to person, place, and time. She appears well-developed and well-nourished. No distress.  HENT:  Head: Normocephalic and atraumatic.  Eyes: Conjunctivae are normal.  Neck: Normal range of motion. No thyromegaly present.  Cardiovascular: Normal rate and regular rhythm.   Pulmonary/Chest: Effort normal and  breath sounds normal. She has no wheezes.  Abdominal: Soft. Bowel sounds are normal. There is no tenderness.  Musculoskeletal: Normal range of motion. She exhibits no edema or deformity.  Neurological: She is alert and oriented to person, place, and  time.  Skin: Skin is warm and dry. She is not diaphoretic.  Psychiatric: She has a normal mood and affect.    BP 120/64 (BP Location: Left Arm, Patient Position: Sitting, Cuff Size: Normal)   Pulse 86   Temp 98.2 F (36.8 C) (Oral)   Resp 18   Wt 271 lb (122.9 kg)   SpO2 98%   BMI 48.01 kg/m  Wt Readings from Last 3 Encounters:  05/11/17 271 lb (122.9 kg)  04/29/17 270 lb (122.5 kg)  01/13/17 271 lb (122.9 kg)   BP Readings from Last 3 Encounters:  05/11/17 120/64  04/29/17 (!) 143/85  01/13/17 136/84     Immunization History  Administered Date(s) Administered  . Influenza,inj,Quad PF,6+ Mos 04/30/2016  . Influenza-Unspecified 05/31/2013, 05/01/2014, 06/11/2015  . Pneumococcal Conjugate-13 04/02/2014  . Pneumococcal Polysaccharide-23 08/31/1998, 09/06/2015  . Tdap 07/31/2012    Health Maintenance  Topic Date Due  . INFLUENZA VACCINE  03/31/2017  . COLONOSCOPY  11/22/2018  . TETANUS/TDAP  07/31/2022  . DEXA SCAN  Completed  . PNA vac Low Risk Adult  Completed    Lab Results  Component Value Date   WBC 6.4 02/01/2017   HGB 13.5 02/01/2017   HCT 39.1 02/01/2017   PLT 234.0 02/01/2017   GLUCOSE 105 (H) 02/01/2017   CHOL 167 02/01/2017   TRIG 181.0 (H) 02/01/2017   HDL 46.60 02/01/2017   LDLCALC 85 02/01/2017   ALT 12 02/01/2017   AST 15 02/01/2017   NA 138 02/01/2017   K 4.5 02/01/2017   CL 101 02/01/2017   CREATININE 1.07 02/01/2017   BUN 22 02/01/2017   CO2 29 02/01/2017   TSH 1.95 02/01/2017   INR 1.55 (H) 08/06/2015    Lab Results  Component Value Date   TSH 1.95 02/01/2017   Lab Results  Component Value Date   WBC 6.4 02/01/2017   HGB 13.5 02/01/2017   HCT 39.1 02/01/2017   MCV 93.0 02/01/2017     PLT 234.0 02/01/2017   Lab Results  Component Value Date   NA 138 02/01/2017   K 4.5 02/01/2017   CO2 29 02/01/2017   GLUCOSE 105 (H) 02/01/2017   BUN 22 02/01/2017   CREATININE 1.07 02/01/2017   BILITOT 0.7 02/01/2017   ALKPHOS 78 02/01/2017   AST 15 02/01/2017   ALT 12 02/01/2017   PROT 7.1 02/01/2017   ALBUMIN 4.4 02/01/2017   CALCIUM 9.9 02/01/2017   ANIONGAP 8 01/13/2017   GFR 52.33 (L) 02/01/2017   Lab Results  Component Value Date   CHOL 167 02/01/2017   Lab Results  Component Value Date   HDL 46.60 02/01/2017   Lab Results  Component Value Date   LDLCALC 85 02/01/2017   Lab Results  Component Value Date   TRIG 181.0 (H) 02/01/2017   Lab Results  Component Value Date   CHOLHDL 4 02/01/2017   No results found for: HGBA1C       Assessment & Plan:   Problem List Items Addressed This Visit    Atrial fibrillation ([place])    Rate controlled, follows with afib clinic next week. Is asymptomatic      Relevant Medications   bumetanide (BUMEX) 1 MG tablet   Essential hypertension    Well controlled, no changes to meds. Encouraged heart healthy diet such as the DASH diet and exercise as tolerated.       Relevant Medications   bumetanide (BUMEX) 1 MG tablet   Other Relevant Orders  CBC   Comprehensive metabolic panel   TSH   Hyperlipidemia, mixed    Tolerating statin, encouraged heart healthy diet, avoid trans fats, minimize simple carbs and saturated fats. Increase exercise as tolerated      Relevant Medications   bumetanide (BUMEX) 1 MG tablet   Other Relevant Orders   Lipid panel   Obesity    Encouraged DASH diet, decrease po intake and increase exercise as tolerated. Needs 7-8 hours of sleep nightly. Avoid trans fats, eat small, frequent meals every 4-5 hours with lean proteins, complex carbs and healthy fats. Minimize simple carbs, bariatric referral declined      Obstructive sleep apnea    Uses CPAP nightly follows with CPAP annually sees  pulmonary.         I am having Ms. Rue maintain her calcium carbonate, loratadine, psyllium, acetaminophen, diltiazem, diltiazem, apixaban, atorvastatin, lisinopril, dofetilide, potassium chloride, and bumetanide.  Meds ordered this encounter  Medications  . bumetanide (BUMEX) 1 MG tablet    Sig: Take 1 tablet (1 mg total) by mouth daily.    Dispense:  90 tablet    Refill:  0    CMA served as scribe during this visit. History, Physical and Plan performed by medical provider. Documentation and orders reviewed and attested to.  [name], MD

## 2017-05-11 NOTE — Assessment & Plan Note (Signed)
Rate controlled, follows with afib clinic next week. Is asymptomatic

## 2017-05-11 NOTE — Assessment & Plan Note (Signed)
Uses CPAP nightly follows with CPAP annually sees pulmonary.

## 2017-05-11 NOTE — Addendum Note (Signed)
Addended by: Magdalene Molly A on: 05/11/2017 02:17 PM   Modules accepted: Orders

## 2017-05-13 ENCOUNTER — Ambulatory Visit (HOSPITAL_BASED_OUTPATIENT_CLINIC_OR_DEPARTMENT_OTHER)
Admission: RE | Admit: 2017-05-13 | Discharge: 2017-05-13 | Disposition: A | Discharge status: Home / Self Care | Payer: Medicare Other | Source: Ambulatory Visit | Attending: Family Medicine | Admitting: Family Medicine

## 2017-05-13 DIAGNOSIS — Z1231 Encounter for screening mammogram for malignant neoplasm of breast: Secondary | ICD-10-CM | POA: Insufficient documentation

## 2017-05-19 ENCOUNTER — Ambulatory Visit (HOSPITAL_COMMUNITY)
Admission: RE | Admit: 2017-05-19 | Discharge: 2017-05-19 | Disposition: A | Discharge status: Home / Self Care | Payer: Medicare Other | Source: Ambulatory Visit | Attending: Nurse Practitioner | Admitting: Nurse Practitioner

## 2017-05-19 VITALS — BP 142/74 | HR 77 | Ht 63.0 in | Wt 272.6 lb

## 2017-05-19 DIAGNOSIS — I10 Essential (primary) hypertension: Secondary | ICD-10-CM | POA: Insufficient documentation

## 2017-05-19 DIAGNOSIS — I480 Paroxysmal atrial fibrillation: Secondary | ICD-10-CM | POA: Diagnosis not present

## 2017-05-19 DIAGNOSIS — G4733 Obstructive sleep apnea (adult) (pediatric): Secondary | ICD-10-CM | POA: Diagnosis not present

## 2017-05-19 DIAGNOSIS — Z86718 Personal history of other venous thrombosis and embolism: Secondary | ICD-10-CM | POA: Insufficient documentation

## 2017-05-19 DIAGNOSIS — M1990 Unspecified osteoarthritis, unspecified site: Secondary | ICD-10-CM | POA: Insufficient documentation

## 2017-05-19 DIAGNOSIS — I2510 Atherosclerotic heart disease of native coronary artery without angina pectoris: Secondary | ICD-10-CM | POA: Insufficient documentation

## 2017-05-19 DIAGNOSIS — Z79899 Other long term (current) drug therapy: Secondary | ICD-10-CM | POA: Diagnosis not present

## 2017-05-19 DIAGNOSIS — I350 Nonrheumatic aortic (valve) stenosis: Secondary | ICD-10-CM | POA: Insufficient documentation

## 2017-05-19 DIAGNOSIS — E669 Obesity, unspecified: Secondary | ICD-10-CM | POA: Insufficient documentation

## 2017-05-19 DIAGNOSIS — Z8249 Family history of ischemic heart disease and other diseases of the circulatory system: Secondary | ICD-10-CM | POA: Insufficient documentation

## 2017-05-19 DIAGNOSIS — E7800 Pure hypercholesterolemia, unspecified: Secondary | ICD-10-CM | POA: Diagnosis not present

## 2017-05-19 LAB — MAGNESIUM: Magnesium: 2.2 mg/dL (ref 1.7–2.4)

## 2017-05-19 NOTE — Progress Notes (Signed)
.aPatient ID: Tracy Miles, female   DOB: Mar 09, 1934, 83 y.o.   MRN: [id number]     Primary Care Physician: Tracy Homans, MD Referring Physician: Dr. Lillette Boxer Miles is a 83 y.o. female with a h/o  PAF on tikosyn for f/u. She reports no occurence of afib. Feels well. Recent physical with good labs. Creatinine 9/11 at 1.07. Continues on Eliquis at 5 mg bid.  Today, she denies symptoms of palpitations, chest Miles, shortness of breath, orthopnea, PND, lower extremity edema, dizziness, presyncope, syncope, or neurologic sequela. The patient is tolerating medications without difficulties and is otherwise without complaint today.   Past Medical History  Diagnosis Date  . Hypertension   . Heart murmur   . Coronary artery disease   . Atrial fibrillation ([place])   . Anemia   . Arthritis   . Colon polyp   . Personal history of DVT (deep vein thrombosis) X 2    "left"  . Hyperlipidemia   . Pancreatitis     post hysterectomy  . Sleep apnea   . Aortic stenosis     Very mild  . Hypercholesteremia   . OSA on CPAP   . PAF (paroxysmal atrial fibrillation) ([place])   . History of valvular heart disease     LEFT  . Chicken pox as a child  . Measles as a child  . Mumps child and teenager  . Allergy   . Obesity   . Unspecified constipation 11/16/2013  . Heme positive stool 11/16/2013  . DDD (degenerative disc disease) 11/16/2013  . Unspecified sleep apnea 11/19/2013  . Allergic state 11/19/2013  . OA (osteoarthritis) 11/19/2013    S/p L TKR  . Benign paroxysmal positional vertigo 12/17/2013  . Abnormal results of thyroid function studies 12/17/2013  . Iron deficiency anemia 11/13/2013  . Personal history of colonic polyps 02/25/2014  . Spinal stenosis   . Obstructive sleep apnea 02/22/2015  . Visit for monitoring Tikosyn therapy 08/11/2015  . Medicare annual wellness visit, subsequent 09/15/2015   Past Surgical History  Procedure Laterality Date  . Tonsilectomy, adenoidectomy,  bilateral myringotomy and tubes  child  . Lumbar laminectomy  40 yrs ago    "L3-4"  . Tubal ligation  83 years old  . Total abdominal hysterectomy  1995    had 2 tumors- benign  . Replacement total knee Left 2013  . Cyst removal hand Bilateral 1990's    "played to much golf"  . Meniscus repair Bilateral 2000 and 2010  . Appendectomy    . Tonsillectomy and adenoidectomy    . Joint replacement    . Back surgery    . Cataract extraction w/ intraocular lens  implant, bilateral Bilateral 2016  . Dilation and curettage of uterus    . Cardioversion N/A 08/09/2015    Procedure: CARDIOVERSION;  Surgeon: Tracy Pain, MD;  Location: [facility];  Service: Cardiovascular;  Laterality: N/A;  . Eye surgery  2016    cataracts    Current Outpatient Prescriptions  Medication Sig Dispense Refill  . acetaminophen (TYLENOL) 650 MG CR tablet Take 1,300 mg by mouth every evening.    Marland Kitchen apixaban (ELIQUIS) 5 MG TABS tablet Take 1 tablet (5 mg total) by mouth 2 (two) times daily. 180 tablet 2  . atorvastatin (LIPITOR) 20 MG tablet Take 1 tablet (20 mg total) by mouth daily. 90 tablet 2  . bumetanide (BUMEX) 1 MG tablet TAKE ONE (1) TABLET BY MOUTH EVERY DAY 90 tablet 0  .  calcium carbonate (OS-CAL) 600 MG TABS tablet Take 600 mg by mouth 2 (two) times daily with a meal.    . diltiazem (CARDIZEM CD) 240 MG 24 hr capsule Take 240 mg by mouth daily.    Marland Kitchen dofetilide (TIKOSYN) 250 MCG capsule Take 1 capsule (250 mcg total) by mouth 2 (two) times daily. 60 capsule 3  . Lactobacillus (PROBIOTIC ACIDOPHILUS) TABS Take 1 tablet by mouth daily. Reported on 03/04/2016    . lisinopril (PRINIVIL,ZESTRIL) 10 MG tablet Take 1 tablet (10 mg total) by mouth daily. 90 tablet 2  . loratadine (CLARITIN) 10 MG tablet Take 10 mg by mouth daily.     . potassium chloride (KLOR-CON 10) 10 MEQ tablet TAKE ONE (1) TABLET EACH DAY 90 tablet 2  . psyllium (REGULOID) 0.52 g capsule Take 0.52 g by mouth daily.     No current  facility-administered medications for this encounter.    Allergies  Allergen Reactions  . Gabapentin Swelling  . Lactose Intolerance (Gi) Other (See Comments)    Bothers her stomach  . Zebeta [Bisoprolol Fumarate] Nausea Only  . Penicillins Rash    Social History   Social History  . Marital Status: Widowed    Spouse Name: N/A  . Number of Children: 3  . Years of Education: N/A   Occupational History  . retired Pharmacist, hospital    Social History Main Topics  . Smoking status: Never Smoker   . Smokeless tobacco: Never Used     Comment: never used tobacco  . Alcohol Use: No  . Drug Use: No  . Sexual Activity: No     Comment: lives [place] [facility], low sodium diet   Other Topics Concern  . Not on file   Social History Narrative    Family History  Problem Relation Age of Onset  . Heart attack Mother 60  . Hyperlipidemia Mother     ?  Marland Kitchen Dementia Mother   . Pernicious anemia Mother   . COPD Father     smoker  . Cancer Father 66    prostate  . Heart disease Brother     quadruple bipass surgery  . Hyperlipidemia Brother   . Hypertension Brother   . Diabetes Maternal Grandmother     type 2  . Pernicious anemia Maternal Grandmother   . Colon cancer Neg Hx   . Pancreatic cancer Neg Hx   . Stomach cancer Neg Hx   . Throat cancer Neg Hx   . Liver disease Neg Hx   . Gout Son   . Atrial fibrillation Son   . Hyperlipidemia Son   . Cancer Maternal Grandfather     liver  . Cancer Paternal Grandmother     lung- doesn't think she smokes?  . Stroke Paternal Grandfather   . Cancer Son 50    non hodgin's lymphoma  . Gout Son   . Hyperlipidemia Son   . Sleep apnea Son     ROS- All systems are reviewed and negative except as per the HPI above  Physical Exam: Filed Vitals:   03/04/16 1351  BP: 138/74  Pulse: 75  Height: 5' 3.5" (1.613 m)  Weight: 260 lb 12.8 oz (118.298 kg)    GEN- The patient is well appearing, alert and oriented x 3 today.   Head- normocephalic,  atraumatic Eyes-  Sclera clear, conjunctiva pink Ears- hearing intact Oropharynx- clear Neck- supple, no JVP Lymph- no cervical lymphadenopathy Lungs- Clear to ausculation bilaterally, normal work of breathing Heart-  Regular rate and rhythm, no murmurs, rubs or gallops, PMI not laterally displaced GI- soft, NT, ND, + BS Extremities- no clubbing, cyanosis, or edema MS- no significant deformity or atrophy Skin- no rash or lesion Psych- euthymic mood, full affect Neuro- strength and sensation are intact  EKG- NSR at 80 bpm, Pr int 172 ms, QRS int 76 ms, Qtc 449 ms Epic records reviewed   Assessment and Plan: 1. Afib Doing well on tikosyn, continue 250 mg bid, reports no recent afib Continue daily cardizem with 30 mg as needed for bouts of afib Continue apixaban  Bmet/mag pending  F/u in 4 months  PCP as scheduled  Butch Penny C. Jahnyla Parrillo, [facility] [address] [place], [place] [address] [phone]

## 2017-06-16 DIAGNOSIS — H524 Presbyopia: Secondary | ICD-10-CM | POA: Diagnosis not present

## 2017-06-16 DIAGNOSIS — H26493 Other secondary cataract, bilateral: Secondary | ICD-10-CM | POA: Diagnosis not present

## 2017-07-05 ENCOUNTER — Encounter: Payer: Self-pay | Admitting: Family

## 2017-07-05 ENCOUNTER — Ambulatory Visit (INDEPENDENT_AMBULATORY_CARE_PROVIDER_SITE_OTHER): Payer: Medicare Other | Admitting: Family

## 2017-07-05 VITALS — BP 140/78 | HR 80 | Temp 99.1°F | Resp 16 | Ht 63.0 in | Wt 273.2 lb

## 2017-07-05 DIAGNOSIS — H109 Unspecified conjunctivitis: Secondary | ICD-10-CM

## 2017-07-05 MED ORDER — DOXYCYCLINE HYCLATE 100 MG PO TABS
100.0000 mg | ORAL_TABLET | Freq: Two times a day (BID) | ORAL | 0 refills | Status: DC
Start: 2017-07-05 — End: 2017-07-16

## 2017-07-05 MED ORDER — NEOMYCIN-POLYMYXIN-HC 3.5-10000-1 OP SUSP: 3.0000 [drp] | Freq: Four times a day (QID) | OPHTHALMIC | 0 refills | Status: DC

## 2017-07-05 NOTE — Progress Notes (Signed)
Subjective:    Patient ID: Tracy Miles, female    DOB: 10-27-1933, 83 y.o.   MRN: [id number]  HPI  Tracy Miles is an 83 yr old female who presents today with chief complaint of headache.  HA began on Saturday above the right eye.  Since that time she reports that she has had some photophobia in the right eye and pain with EOM's.  Has AM discharge with eye matted shut.  Using a warm compress in the AM which seems to help.   Notes some photophobia in the right eye.  She denies vision changes. Had yearly annual checkup with Dr. Bing Plume her ophthalmologist 2 weeks ago and was told exam looked good.    She reports that she takes a claritin every AM. Mild sinus congestion is at baseline. Denies known fever.     Review of Systems See HPI  Past Medical History:  Diagnosis Date  . Anemia   . Aortic stenosis    Very mild  . Benign paroxysmal positional vertigo 12/17/2013  . Chicken pox as a child  . Colon polyp   . Coronary artery disease   . DDD (degenerative disc disease) 11/16/2013  . Hyperlipidemia   . Hypertension   . Iron deficiency anemia 11/13/2013  . Mumps child and teenager  . OA (osteoarthritis) 11/19/2013   S/p L TKR  . Obesity   . OSA on CPAP   . Pancreatitis    post hysterectomy  . Persistent atrial fibrillation ([place])   . Personal history of colonic polyps 02/25/2014  . Personal history of DVT (deep vein thrombosis) X 2   "left"  . Sleep apnea   . Spinal stenosis      Social History   Socioeconomic History  . Marital status: Widowed    Spouse name: Not on file  . Number of children: 3  . Years of education: Not on file  . Highest education level: Not on file  Social Needs  . Financial resource strain: Not on file  . Food insecurity - worry: Not on file  . Food insecurity - inability: Not on file  . Transportation needs - medical: Not on file  . Transportation needs - non-medical: Not on file  Occupational History  . Occupation: retired Pharmacist, hospital  Tobacco  Use  . Smoking status: Never Smoker  . Smokeless tobacco: Never Used  . Tobacco comment: never used tobacco  Substance and Sexual Activity  . Alcohol use: No  . Drug use: No  . Sexual activity: No    Comment: lives [place] [facility], low sodium diet  Other Topics Concern  . Not on file  Social History Narrative  . Not on file    Past Surgical History:  Procedure Laterality Date  . APPENDECTOMY    . CATARACT EXTRACTION W/ INTRAOCULAR LENS  IMPLANT, BILATERAL Bilateral 2016  . CYST REMOVAL HAND Bilateral 1990's   "played to much golf"  . DILATION AND CURETTAGE OF UTERUS    . LUMBAR LAMINECTOMY  40 yrs ago   "L3-4"  . MENISCUS REPAIR Bilateral 2000 and 2010  . REPLACEMENT TOTAL KNEE Left 2013  . TONSILECTOMY, ADENOIDECTOMY, BILATERAL MYRINGOTOMY AND TUBES  child  . TOTAL ABDOMINAL HYSTERECTOMY  1995   had 2 tumors- benign  . TUBAL LIGATION  83 years old    Family History  Problem Relation Age of Onset  . Heart attack Mother 66  . Hyperlipidemia Mother        ?  Marland Kitchen Dementia  Mother   . Pernicious anemia Mother   . COPD Father        smoker  . Cancer Father 64       prostate  . Heart disease Brother        quadruple bipass surgery  . Hyperlipidemia Brother   . Hypertension Brother   . Diabetes Maternal Grandmother        type 2  . Pernicious anemia Maternal Grandmother   . Gout Son   . Atrial fibrillation Son   . Hyperlipidemia Son   . Cancer Maternal Grandfather        liver  . Cancer Paternal Grandmother        lung- doesn't think she smokes?  . Stroke Paternal Grandfather   . Cancer Son 50       non hodgin's lymphoma  . Gout Son   . Hyperlipidemia Son   . Sleep apnea Son   . Colon cancer Neg Hx   . Pancreatic cancer Neg Hx   . Stomach cancer Neg Hx   . Throat cancer Neg Hx   . Liver disease Neg Hx       Current Outpatient Medications on File Prior to Visit  Medication Sig Dispense Refill  . acetaminophen (TYLENOL) 325 MG tablet Take 325 mg by mouth  2 (two) times daily.    Marland Kitchen apixaban (ELIQUIS) 5 MG TABS tablet TAKE ONE (1) TABLET BY MOUTH TWO (2) TIMES DAILY 180 tablet 1  . atorvastatin (LIPITOR) 20 MG tablet Take 1 tablet (20 mg total) by mouth daily at 6 PM. 90 tablet 1  . bumetanide (BUMEX) 1 MG tablet Take 1 tablet (1 mg total) by mouth daily. 90 tablet 0  . calcium carbonate (OS-CAL) 600 MG TABS tablet Take 600 mg by mouth 2 (two) times daily with a meal.    . diltiazem (CARDIZEM CD) 120 MG 24 hr capsule Take 1 capsule (120 mg total) by mouth daily. 30 capsule 6  . diltiazem (CARDIZEM) 30 MG tablet Take 1 tablet every 4 hours AS NEEDED for heart rate >100 as long as blood pressure >100. 45 tablet 3  . dofetilide (TIKOSYN) 250 MCG capsule Take 1 capsule (250 mcg total) by mouth 2 (two) times daily. 60 capsule 6  . lactobacillus acidophilus (BACID) TABS tablet Take 2 tablets by mouth 3 (three) times daily.    Marland Kitchen lisinopril (PRINIVIL,ZESTRIL) 10 MG tablet Take 1 tablet (10 mg total) by mouth daily. 90 tablet 1  . loratadine (CLARITIN) 10 MG tablet Take 10 mg by mouth daily.     . potassium chloride (KLOR-CON 10) 10 MEQ tablet Take one tablet in the morning and two tablets in the pm 90 tablet 6  . psyllium (REGULOID) 0.52 g capsule Take 0.52 g by mouth 2 (two) times daily.      No current facility-administered medications on file prior to visit.     BP 140/78 (BP Location: Right Arm, Cuff Size: Large)   Pulse 80   Temp 99.1 F (37.3 C) (Oral)   Resp 16   Ht 5\' 3"  (1.6 m)   Wt 273 lb 3.2 oz (123.9 kg)   SpO2 98%   BMI 48.40 kg/m       Objective:   Physical Exam  Constitutional: She appears well-developed and well-nourished.  HENT:  Right Ear: Tympanic membrane and ear canal normal.  Left Ear: Tympanic membrane and ear canal normal.  Eyes: EOM are normal.    Lower half of sclera  on right appears mildly injected and swollen.   Mild tenderness to palpation beneath the right eye  Cardiovascular: Normal rate, regular rhythm  and normal heart sounds.  No murmur heard. Pulmonary/Chest: Effort normal and breath sounds normal. No respiratory distress. She has no wheezes.  Psychiatric: She has a normal mood and affect. Her behavior is normal. Judgment and thought content normal.          Assessment & Plan:  Conjunctivitis- Temp 99.1. Case reviewed with Dr. Charlett Blake. Will rx with cortisporin ophthalmic drops and cover with empirically with doxycycline due to tenderness beneath the right eye. Pt is advised as follows:   Call if increased pain.  Go to ER if changes in the vision of your right eye. Let me know if symptoms are not improved in 3 days.

## 2017-07-05 NOTE — Patient Instructions (Addendum)
Add Rhinocort, Nasonex, Nasacort and consider Loratadine twice a day Sinusitis, Adult Sinusitis is soreness and inflammation of your sinuses. Sinuses are hollow spaces in the bones around your face. They are located:  Around your eyes.  In the middle of your forehead.  Behind your nose.  In your cheekbones.  Your sinuses and nasal passages are lined with a stringy fluid (mucus). Mucus normally drains out of your sinuses. When your nasal tissues get inflamed or swollen, the mucus can get trapped or blocked so air cannot flow through your sinuses. This lets bacteria, viruses, and funguses grow, and that leads to infection. Follow these instructions at home: Medicines  Take, use, or apply over-the-counter and prescription medicines only as told by your doctor. These may include nasal sprays.  If you were prescribed an antibiotic medicine, take it as told by your doctor. Do not stop taking the antibiotic even if you start to feel better. Hydrate and Humidify  Drink enough water to keep your pee (urine) clear or pale yellow.  Use a cool mist humidifier to keep the humidity level in your home above 50%.  Breathe in steam for 10-15 minutes, 3-4 times a day or as told by your doctor. You can do this in the bathroom while a hot shower is running.  Try not to spend time in cool or dry air. Rest  Rest as much as possible.  Sleep with your head raised (elevated).  Make sure to get enough sleep each night. General instructions  Put a warm, moist washcloth on your face 3-4 times a day or as told by your doctor. This will help with discomfort.  Wash your hands often with soap and water. If there is no soap and water, use hand sanitizer.  Do not smoke. Avoid being around people who are smoking (secondhand smoke).  Keep all follow-up visits as told by your doctor. This is important. Contact a doctor if:  You have a fever.  Your symptoms get worse.  Your symptoms do not get better  within 10 days. Get help right away if:  You have a very bad headache.  You cannot stop throwing up (vomiting).  You have pain or swelling around your face or eyes.  You have trouble seeing.  You feel confused.  Your neck is stiff.  You have trouble breathing. This information is not intended to replace advice given to you by your health care provider. Make sure you discuss any questions you have with your health care provider. Document Released: 02/03/2008 Document Revised: 04/12/2016 Document Reviewed: 06/12/2015 Elsevier Interactive Patient Education  [employer].

## 2017-07-08 ENCOUNTER — Other Ambulatory Visit: Payer: Self-pay | Admitting: Family Medicine

## 2017-07-16 ENCOUNTER — Ambulatory Visit (INDEPENDENT_AMBULATORY_CARE_PROVIDER_SITE_OTHER): Payer: Medicare Other | Admitting: Family Medicine

## 2017-07-16 ENCOUNTER — Encounter: Payer: Self-pay | Admitting: Family Medicine

## 2017-07-16 DIAGNOSIS — J329 Chronic sinusitis, unspecified: Secondary | ICD-10-CM | POA: Diagnosis not present

## 2017-07-16 DIAGNOSIS — I10 Essential (primary) hypertension: Secondary | ICD-10-CM | POA: Diagnosis not present

## 2017-07-16 DIAGNOSIS — E782 Mixed hyperlipidemia: Secondary | ICD-10-CM

## 2017-07-16 DIAGNOSIS — E6609 Other obesity due to excess calories: Secondary | ICD-10-CM

## 2017-07-16 MED ORDER — DOXYCYCLINE HYCLATE 100 MG PO TABS: 100.0000 mg | ORAL_TABLET | Freq: Two times a day (BID) | ORAL | 0 refills | Status: DC

## 2017-07-16 NOTE — Progress Notes (Signed)
Subjective:  I acted as a Education administrator for Dr. Charlett Blake. Tracy Miles, [place]  Patient ID: Tracy Miles, female    DOB: [birth year], 83 y.o.   MRN: [id number]  No chief complaint on file.   HPI  Patient is in today for a follow up visit. She is following up on her HTn, hyperlipidemia and other medical concerns. No recent febrile illness or acute hospitalizations. Denies CP/palp/SOB/HA/fevers/GI or GU c/o. Taking meds as prescribed. She has been struggling with sinus congestion. Also notes some fatigue but no fevers or chills. Persistent pedal edema Is noted but it is not worsening. Is trying to maintain a heart healthy diet.    Patient Care Team: [name], MD as PCP - General (Family Medicine) Clayborne Artist, NP as Nurse Practitioner (Cardiology) Rigoberto Noel, MD as Consulting Physician (Pulmonary Disease) Melrose Nakayama, MD as Consulting Physician (Orthopedic Surgery)   Past Medical History:  Diagnosis Date  . Anemia   . Aortic stenosis    Very mild  . Benign paroxysmal positional vertigo 12/17/2013  . Chicken pox as a child  . Colon polyp   . Coronary artery disease   . DDD (degenerative disc disease) 11/16/2013  . Hyperlipidemia   . Hypertension   . Iron deficiency anemia 11/13/2013  . Mumps child and teenager  . OA (osteoarthritis) 11/19/2013   S/p L TKR  . Obesity   . OSA on CPAP   . Pancreatitis    post hysterectomy  . Persistent atrial fibrillation ([place])   . Personal history of colonic polyps 02/25/2014  . Personal history of DVT (deep vein thrombosis) X 2   "left"  . Sleep apnea   . Spinal stenosis     Past Surgical History:  Procedure Laterality Date  . APPENDECTOMY    . CANCELLED PROCEDURE  09/24/2016   Performed by Fay Records, MD at [facility] ENDOSCOPY  . CARDIOVERSION N/A 08/09/2015   Performed by Jerline Pain, MD at [facility] ENDOSCOPY  . CATARACT EXTRACTION W/ INTRAOCULAR LENS  IMPLANT, BILATERAL Bilateral 2016  . CYST REMOVAL HAND Bilateral 1990's   "played to  much golf"  . DILATION AND CURETTAGE OF UTERUS    . LUMBAR LAMINECTOMY  40 yrs ago   "L3-4"  . MENISCUS REPAIR Bilateral 2000 and 2010  . REPLACEMENT TOTAL KNEE Left 2013  . TONSILECTOMY, ADENOIDECTOMY, BILATERAL MYRINGOTOMY AND TUBES  child  . TOTAL ABDOMINAL HYSTERECTOMY  1995   had 2 tumors- benign  . TUBAL LIGATION  83 years old    Family History  Problem Relation Age of Onset  . Heart attack Mother 70  . Hyperlipidemia Mother        ?  Marland Kitchen Dementia Mother   . Pernicious anemia Mother   . COPD Father        smoker  . Cancer Father 19       prostate  . Heart disease Brother        quadruple bipass surgery  . Hyperlipidemia Brother   . Hypertension Brother   . Diabetes Maternal Grandmother        type 2  . Pernicious anemia Maternal Grandmother   . Gout Son   . Atrial fibrillation Son   . Hyperlipidemia Son   . Cancer Maternal Grandfather        liver  . Cancer Paternal Grandmother        lung- doesn't think she smokes?  . Stroke Paternal Grandfather   . Cancer Son 24  non hodgin's lymphoma  . Gout Son   . Hyperlipidemia Son   . Sleep apnea Son   . Colon cancer Neg Hx   . Pancreatic cancer Neg Hx   . Stomach cancer Neg Hx   . Throat cancer Neg Hx   . Liver disease Neg Hx     Social History   Socioeconomic History  . Marital status: Widowed    Spouse name: Not on file  . Number of children: 3  . Years of education: Not on file  . Highest education level: Not on file  Social Needs  . Financial resource strain: Not on file  . Food insecurity - worry: Not on file  . Food insecurity - inability: Not on file  . Transportation needs - medical: Not on file  . Transportation needs - non-medical: Not on file  Occupational History  . Occupation: retired Pharmacist, hospital  Tobacco Use  . Smoking status: Never Smoker  . Smokeless tobacco: Never Used  . Tobacco comment: never used tobacco  Substance and Sexual Activity  . Alcohol use: No  . Drug use: No  . Sexual  activity: No    Comment: lives at [place], low sodium diet  Other Topics Concern  . Not on file  Social History Narrative  . Not on file    Outpatient Medications Prior to Visit  Medication Sig Dispense Refill  . acetaminophen (TYLENOL) 325 MG tablet Take 325 mg by mouth 2 (two) times daily.    Marland Kitchen atorvastatin (LIPITOR) 20 MG tablet Take 1 tablet (20 mg total) by mouth daily at 6 PM. 90 tablet 1  . bumetanide (BUMEX) 1 MG tablet Take 1 tablet (1 mg total) by mouth daily. 90 tablet 0  . calcium carbonate (OS-CAL) 600 MG TABS tablet Take 600 mg by mouth 2 (two) times daily with a meal.    . diltiazem (CARDIZEM CD) 120 MG 24 hr capsule Take 1 capsule (120 mg total) by mouth daily. 30 capsule 6  . diltiazem (CARDIZEM) 30 MG tablet Take 1 tablet every 4 hours AS NEEDED for heart rate >100 as long as blood pressure >100. 45 tablet 3  . dofetilide (TIKOSYN) 250 MCG capsule Take 1 capsule (250 mcg total) by mouth 2 (two) times daily. 60 capsule 6  . ELIQUIS STARTER PACK (ELIQUIS STARTER PACK) 5 MG TABS TAKE ONE TABLET TWICE DAILY 180 each 0  . lactobacillus acidophilus (BACID) TABS tablet Take 2 tablets daily by mouth.     Marland Kitchen lisinopril (PRINIVIL,ZESTRIL) 10 MG tablet Take 1 tablet (10 mg total) by mouth daily. 90 tablet 1  . loratadine (CLARITIN) 10 MG tablet Take 10 mg by mouth daily.     . potassium chloride (KLOR-CON 10) 10 MEQ tablet Take one tablet in the morning and two tablets in the pm 90 tablet 6  . psyllium (REGULOID) 0.52 g capsule Take 0.52 g by mouth 2 (two) times daily.     Marland Kitchen doxycycline (VIBRA-TABS) 100 MG tablet Take 1 tablet (100 mg total) 2 (two) times daily by mouth. 14 tablet 0  . neomycin-polymyxin-hydrocortisone (CORTISPORIN) 3.5-10000-1 ophthalmic suspension Place 3 drops 4 (four) times daily into both eyes. (Patient not taking: Reported on 07/16/2017) 7.5 mL 0   No facility-administered medications prior to visit.      Review of Systems  Constitutional: Negative  for fever and malaise/fatigue.  HENT: Positive for congestion.   Eyes: Negative for blurred vision.  Respiratory: Negative for cough and shortness of breath.  Cardiovascular: Negative for chest pain, palpitations and leg swelling.  Gastrointestinal: Negative for vomiting.  Musculoskeletal: Negative for back pain.  Skin: Negative for rash.  Neurological: Negative for loss of consciousness and headaches.       Objective:    Physical Exam  Constitutional: She is oriented to person, place, and time. She appears well-developed and well-nourished. No distress.  HENT:  Head: Normocephalic and atraumatic.  Nasal mucosa boggy and erythematous  Eyes: Conjunctivae are normal.  Neck: Normal range of motion. No thyromegaly present.  Cardiovascular: Normal rate and regular rhythm.  Pulmonary/Chest: Effort normal and breath sounds normal. She has no wheezes.  Abdominal: Soft. Bowel sounds are normal. There is no tenderness.  Musculoskeletal: Normal range of motion. She exhibits edema. She exhibits no deformity.  Neurological: She is alert and oriented to person, place, and time.  Skin: Skin is warm and dry. She is not diaphoretic.  Psychiatric: She has a normal mood and affect.    BP 126/84 (BP Location: Left Arm, Patient Position: Sitting, Cuff Size: Normal)   Pulse 75   Temp 97.9 F (36.6 C) (Oral)   Resp 18   Wt 270 lb 6.4 oz (122.7 kg)   SpO2 99%   BMI 47.90 kg/m  Wt Readings from Last 3 Encounters:  07/16/17 270 lb 6.4 oz (122.7 kg)  07/05/17 273 lb 3.2 oz (123.9 kg)  05/19/17 272 lb 9.6 oz (123.7 kg)   BP Readings from Last 3 Encounters:  07/16/17 126/84  07/05/17 140/78  05/19/17 (!) 142/74     Immunization History  Administered Date(s) Administered  . Influenza, High Dose Seasonal PF 05/11/2017  . Influenza,inj,Quad PF,6+ Mos 04/30/2016  . Influenza-Unspecified 05/31/2013, 05/01/2014, 06/11/2015  . Pneumococcal Conjugate-13 04/02/2014  . Pneumococcal  Polysaccharide-23 08/31/1998, 09/06/2015  . Tdap 07/31/2012    Health Maintenance  Topic Date Due  . COLONOSCOPY  11/22/2018  . TETANUS/TDAP  07/31/2022  . INFLUENZA VACCINE  Completed  . DEXA SCAN  Completed  . PNA vac Low Risk Adult  Completed    Lab Results  Component Value Date   WBC 6.2 05/11/2017   HGB 13.4 05/11/2017   HCT 40.2 05/11/2017   PLT 243.0 05/11/2017   GLUCOSE 132 (H) 05/11/2017   CHOL 173 05/11/2017   TRIG 144.0 05/11/2017   HDL 46.00 05/11/2017   LDLCALC 98 05/11/2017   ALT 13 05/11/2017   AST 17 05/11/2017   NA 140 05/11/2017   K 3.9 05/11/2017   CL 101 05/11/2017   CREATININE 1.07 05/11/2017   BUN 25 (H) 05/11/2017   CO2 28 05/11/2017   TSH 2.13 05/11/2017   INR 1.55 (H) 08/06/2015    Lab Results  Component Value Date   TSH 2.13 05/11/2017   Lab Results  Component Value Date   WBC 6.2 05/11/2017   HGB 13.4 05/11/2017   HCT 40.2 05/11/2017   MCV 96.5 05/11/2017   PLT 243.0 05/11/2017   Lab Results  Component Value Date   NA 140 05/11/2017   K 3.9 05/11/2017   CO2 28 05/11/2017   GLUCOSE 132 (H) 05/11/2017   BUN 25 (H) 05/11/2017   CREATININE 1.07 05/11/2017   BILITOT 0.6 05/11/2017   ALKPHOS 75 05/11/2017   AST 17 05/11/2017   ALT 13 05/11/2017   PROT 7.2 05/11/2017   ALBUMIN 4.5 05/11/2017   CALCIUM 10.0 05/11/2017   ANIONGAP 8 01/13/2017   GFR 52.30 (L) 05/11/2017   Lab Results  Component Value Date   CHOL  173 05/11/2017   Lab Results  Component Value Date   HDL 46.00 05/11/2017   Lab Results  Component Value Date   LDLCALC 98 05/11/2017   Lab Results  Component Value Date   TRIG 144.0 05/11/2017   Lab Results  Component Value Date   CHOLHDL 4 05/11/2017   No results found for: HGBA1C       Assessment & Plan:   Problem List Items Addressed This Visit    Essential hypertension    Well controlled, no changes to meds. Encouraged heart healthy diet such as the DASH diet and exercise as tolerated.        Hyperlipidemia, mixed    Encouraged heart healthy diet, increase exercise, avoid trans fats, consider a krill oil cap daily      Obesity    Encouraged DASH diet, decrease po intake and increase exercise as tolerated. Needs 7-8 hours of sleep nightly. Avoid trans fats, eat small, frequent meals every 4-5 hours with lean proteins, complex carbs and healthy fats. Minimize simple carbs, consider bariatric referral      Sinusitis    Started on Doxycycline and Mucinex bid. Encouraged increased rest and hydration, add probiotics, zinc such as Coldeze or Xicam. Treat fevers as needed      Relevant Medications   doxycycline (VIBRA-TABS) 100 MG tablet      I have discontinued Benjamine Mola Cowman "Betty"'s neomycin-polymyxin-hydrocortisone. I am also having her maintain her calcium carbonate, loratadine, psyllium, acetaminophen, diltiazem, diltiazem, atorvastatin, lisinopril, dofetilide, potassium chloride, bumetanide, lactobacillus acidophilus, ELIQUIS STARTER PACK, and doxycycline.  Meds ordered this encounter  Medications  . DISCONTD: doxycycline (VIBRA-TABS) 100 MG tablet    Sig: Take 1 tablet (100 mg total) 2 (two) times daily by mouth.    Dispense:  28 tablet    Refill:  0  . doxycycline (VIBRA-TABS) 100 MG tablet    Sig: Take 1 tablet (100 mg total) 2 (two) times daily by mouth.    Dispense:  28 tablet    Refill:  0    CMA served as scribe during this visit. History, Physical and Plan performed by medical provider. Documentation and orders reviewed and attested to.  [name], MD

## 2017-07-16 NOTE — Patient Instructions (Signed)
Start Rhinocort, nasacort, nasonex or flonase. Consider increasing Loratadine to twice daily for a week. Call opthatmology if synmptoms. Sinusitis, Adult Sinusitis is soreness and inflammation of your sinuses. Sinuses are hollow spaces in the bones around your face. They are located:  Around your eyes.  In the middle of your forehead.  Behind your nose.  In your cheekbones.  Your sinuses and nasal passages are lined with a stringy fluid (mucus). Mucus normally drains out of your sinuses. When your nasal tissues get inflamed or swollen, the mucus can get trapped or blocked so air cannot flow through your sinuses. This lets bacteria, viruses, and funguses grow, and that leads to infection. Follow these instructions at home: Medicines  Take, use, or apply over-the-counter and prescription medicines only as told by your doctor. These may include nasal sprays.  If you were prescribed an antibiotic medicine, take it as told by your doctor. Do not stop taking the antibiotic even if you start to feel better. Hydrate and Humidify  Drink enough water to keep your pee (urine) clear or pale yellow.  Use a cool mist humidifier to keep the humidity level in your home above 50%.  Breathe in steam for 10-15 minutes, 3-4 times a day or as told by your doctor. You can do this in the bathroom while a hot shower is running.  Try not to spend time in cool or dry air. Rest  Rest as much as possible.  Sleep with your head raised (elevated).  Make sure to get enough sleep each night. General instructions  Put a warm, moist washcloth on your face 3-4 times a day or as told by your doctor. This will help with discomfort.  Wash your hands often with soap and water. If there is no soap and water, use hand sanitizer.  Do not smoke. Avoid being around people who are smoking (secondhand smoke).  Keep all follow-up visits as told by your doctor. This is important. Contact a doctor if:  You have a  fever.  Your symptoms get worse.  Your symptoms do not get better within 10 days. Get help right away if:  You have a very bad headache.  You cannot stop throwing up (vomiting).  You have pain or swelling around your face or eyes.  You have trouble seeing.  You feel confused.  Your neck is stiff.  You have trouble breathing. This information is not intended to replace advice given to you by your health care provider. Make sure you discuss any questions you have with your health care provider. Document Released: 02/03/2008 Document Revised: 04/12/2016 Document Reviewed: 06/12/2015 Elsevier Interactive Patient Education  [employer].

## 2017-07-18 DIAGNOSIS — J329 Chronic sinusitis, unspecified: Secondary | ICD-10-CM | POA: Insufficient documentation

## 2017-07-18 NOTE — Assessment & Plan Note (Signed)
Encouraged DASH diet, decrease po intake and increase exercise as tolerated. Needs 7-8 hours of sleep nightly. Avoid trans fats, eat small, frequent meals every 4-5 hours with lean proteins, complex carbs and healthy fats. Minimize simple carbs, consider bariatric referral 

## 2017-07-18 NOTE — Assessment & Plan Note (Signed)
Well controlled, no changes to meds. Encouraged heart healthy diet such as the DASH diet and exercise as tolerated.  °

## 2017-07-18 NOTE — Assessment & Plan Note (Signed)
Encouraged heart healthy diet, increase exercise, avoid trans fats, consider a krill oil cap daily 

## 2017-07-18 NOTE — Assessment & Plan Note (Signed)
Started on Doxycycline and Mucinex bid. Encouraged increased rest and hydration, add probiotics, zinc such as Coldeze or Xicam. Treat fevers as needed

## 2017-07-26 DIAGNOSIS — H01002 Unspecified blepharitis right lower eyelid: Secondary | ICD-10-CM | POA: Diagnosis not present

## 2017-07-26 DIAGNOSIS — H01001 Unspecified blepharitis right upper eyelid: Secondary | ICD-10-CM | POA: Diagnosis not present

## 2017-07-26 DIAGNOSIS — H01005 Unspecified blepharitis left lower eyelid: Secondary | ICD-10-CM | POA: Diagnosis not present

## 2017-07-26 DIAGNOSIS — H01004 Unspecified blepharitis left upper eyelid: Secondary | ICD-10-CM | POA: Diagnosis not present

## 2017-08-05 ENCOUNTER — Other Ambulatory Visit (HOSPITAL_COMMUNITY): Payer: Self-pay | Admitting: *Deleted

## 2017-08-05 MED ORDER — DILTIAZEM HCL ER COATED BEADS 120 MG PO CP24: 120.0000 mg | ORAL_CAPSULE | Freq: Every day | ORAL | 6 refills | Status: DC

## 2017-08-20 DIAGNOSIS — H524 Presbyopia: Secondary | ICD-10-CM | POA: Diagnosis not present

## 2017-08-20 DIAGNOSIS — H01004 Unspecified blepharitis left upper eyelid: Secondary | ICD-10-CM | POA: Diagnosis not present

## 2017-08-20 DIAGNOSIS — H01002 Unspecified blepharitis right lower eyelid: Secondary | ICD-10-CM | POA: Diagnosis not present

## 2017-08-20 DIAGNOSIS — H01001 Unspecified blepharitis right upper eyelid: Secondary | ICD-10-CM | POA: Diagnosis not present

## 2017-08-20 DIAGNOSIS — H01005 Unspecified blepharitis left lower eyelid: Secondary | ICD-10-CM | POA: Diagnosis not present

## 2017-08-26 ENCOUNTER — Other Ambulatory Visit: Payer: Self-pay | Admitting: Family Medicine

## 2017-08-26 MED ORDER — ATORVASTATIN CALCIUM 20 MG PO TABS: 20.0000 mg | ORAL_TABLET | Freq: Every day | ORAL | 1 refills | Status: DC

## 2017-08-26 MED ORDER — LISINOPRIL 10 MG PO TABS: 10.0000 mg | ORAL_TABLET | Freq: Every day | ORAL | 1 refills | Status: DC

## 2017-08-26 NOTE — Telephone Encounter (Signed)
Copied from [place]. Topic: Quick Communication - See Telephone Encounter >> Aug 26, 2017 10:03 AM Bea Graff, NT wrote: CRM for notification. See Telephone encounter for: Pt needing refill of Lisinopril and lipitor called into [facility] for 90 day supply.   08/26/17.

## 2017-08-26 NOTE — Telephone Encounter (Signed)
Refills have been sent.  

## 2017-08-26 NOTE — Telephone Encounter (Signed)
Last refill on both meds 12/29/16 90 tabs with 1 refill. Last OV: 12/29/16. Pt has upcoming OV 11/09/17. Outside 30 day refill until appt. Sent back to provider.

## 2017-09-02 ENCOUNTER — Other Ambulatory Visit (HOSPITAL_COMMUNITY): Payer: Self-pay | Admitting: *Deleted

## 2017-09-02 MED ORDER — DILTIAZEM HCL ER COATED BEADS 120 MG PO CP24: 120.0000 mg | ORAL_CAPSULE | Freq: Every day | ORAL | 6 refills | Status: DC

## 2017-09-15 ENCOUNTER — Ambulatory Visit (HOSPITAL_COMMUNITY)
Admission: RE | Admit: 2017-09-15 | Discharge: 2017-09-15 | Disposition: A | Discharge status: Home / Self Care | Payer: Medicare Other | Source: Ambulatory Visit | Attending: Nurse Practitioner | Admitting: Nurse Practitioner

## 2017-09-15 VITALS — BP 126/64 | HR 76 | Ht 63.0 in | Wt 271.6 lb

## 2017-09-15 DIAGNOSIS — Z888 Allergy status to other drugs, medicaments and biological substances status: Secondary | ICD-10-CM | POA: Insufficient documentation

## 2017-09-15 DIAGNOSIS — Z86718 Personal history of other venous thrombosis and embolism: Secondary | ICD-10-CM | POA: Diagnosis not present

## 2017-09-15 DIAGNOSIS — Z825 Family history of asthma and other chronic lower respiratory diseases: Secondary | ICD-10-CM | POA: Insufficient documentation

## 2017-09-15 DIAGNOSIS — Z961 Presence of intraocular lens: Secondary | ICD-10-CM | POA: Diagnosis not present

## 2017-09-15 DIAGNOSIS — Z807 Family history of other malignant neoplasms of lymphoid, hematopoietic and related tissues: Secondary | ICD-10-CM | POA: Insufficient documentation

## 2017-09-15 DIAGNOSIS — Z8042 Family history of malignant neoplasm of prostate: Secondary | ICD-10-CM | POA: Diagnosis not present

## 2017-09-15 DIAGNOSIS — I10 Essential (primary) hypertension: Secondary | ICD-10-CM | POA: Insufficient documentation

## 2017-09-15 DIAGNOSIS — I350 Nonrheumatic aortic (valve) stenosis: Secondary | ICD-10-CM | POA: Diagnosis not present

## 2017-09-15 DIAGNOSIS — Z8249 Family history of ischemic heart disease and other diseases of the circulatory system: Secondary | ICD-10-CM | POA: Diagnosis not present

## 2017-09-15 DIAGNOSIS — E669 Obesity, unspecified: Secondary | ICD-10-CM | POA: Diagnosis not present

## 2017-09-15 DIAGNOSIS — I480 Paroxysmal atrial fibrillation: Secondary | ICD-10-CM | POA: Diagnosis not present

## 2017-09-15 DIAGNOSIS — Z6841 Body Mass Index (BMI) 40.0 and over, adult: Secondary | ICD-10-CM | POA: Insufficient documentation

## 2017-09-15 DIAGNOSIS — Z880 Allergy status to penicillin: Secondary | ICD-10-CM | POA: Diagnosis not present

## 2017-09-15 DIAGNOSIS — Z96652 Presence of left artificial knee joint: Secondary | ICD-10-CM | POA: Diagnosis not present

## 2017-09-15 DIAGNOSIS — Z9841 Cataract extraction status, right eye: Secondary | ICD-10-CM | POA: Diagnosis not present

## 2017-09-15 DIAGNOSIS — E785 Hyperlipidemia, unspecified: Secondary | ICD-10-CM | POA: Diagnosis not present

## 2017-09-15 DIAGNOSIS — Z9842 Cataract extraction status, left eye: Secondary | ICD-10-CM | POA: Insufficient documentation

## 2017-09-15 DIAGNOSIS — Z79899 Other long term (current) drug therapy: Secondary | ICD-10-CM | POA: Diagnosis not present

## 2017-09-15 DIAGNOSIS — Z90710 Acquired absence of both cervix and uterus: Secondary | ICD-10-CM | POA: Insufficient documentation

## 2017-09-15 DIAGNOSIS — I2510 Atherosclerotic heart disease of native coronary artery without angina pectoris: Secondary | ICD-10-CM | POA: Insufficient documentation

## 2017-09-15 DIAGNOSIS — Z86010 Personal history of colonic polyps: Secondary | ICD-10-CM | POA: Diagnosis not present

## 2017-09-15 DIAGNOSIS — M1990 Unspecified osteoarthritis, unspecified site: Secondary | ICD-10-CM | POA: Insufficient documentation

## 2017-09-15 DIAGNOSIS — Z7901 Long term (current) use of anticoagulants: Secondary | ICD-10-CM | POA: Insufficient documentation

## 2017-09-15 DIAGNOSIS — G4733 Obstructive sleep apnea (adult) (pediatric): Secondary | ICD-10-CM | POA: Diagnosis not present

## 2017-09-15 LAB — BASIC METABOLIC PANEL
ANION GAP: 10 (ref 5–15)
BUN: 17 mg/dL (ref 6–20)
CALCIUM: 9.4 mg/dL (ref 8.9–10.3)
CO2: 27 mmol/L (ref 22–32)
Chloride: 102 mmol/L (ref 101–111)
Creatinine, Ser: 1.03 mg/dL — ABNORMAL HIGH (ref 0.44–1.00)
GFR calc non Af Amer: 50 mL/min — ABNORMAL LOW (ref >60)
GFR, EST AFRICAN AMERICAN: 57 mL/min — AB (ref >60)
Glucose, Bld: 123 mg/dL — ABNORMAL HIGH (ref 65–99)
Potassium: 4.2 mmol/L (ref 3.5–5.1)
SODIUM: 139 mmol/L (ref 135–145)

## 2017-09-15 LAB — MAGNESIUM: Magnesium: 2 mg/dL (ref 1.7–2.4)

## 2017-09-15 IMAGING — DX DG CHEST 2V
2 series · 2 of 2 positions shown · non-contrast
Comparison: None.

CLINICAL DATA: Shortness of breath, cough

EXAM:
CHEST  2 VIEW

[chest pa]
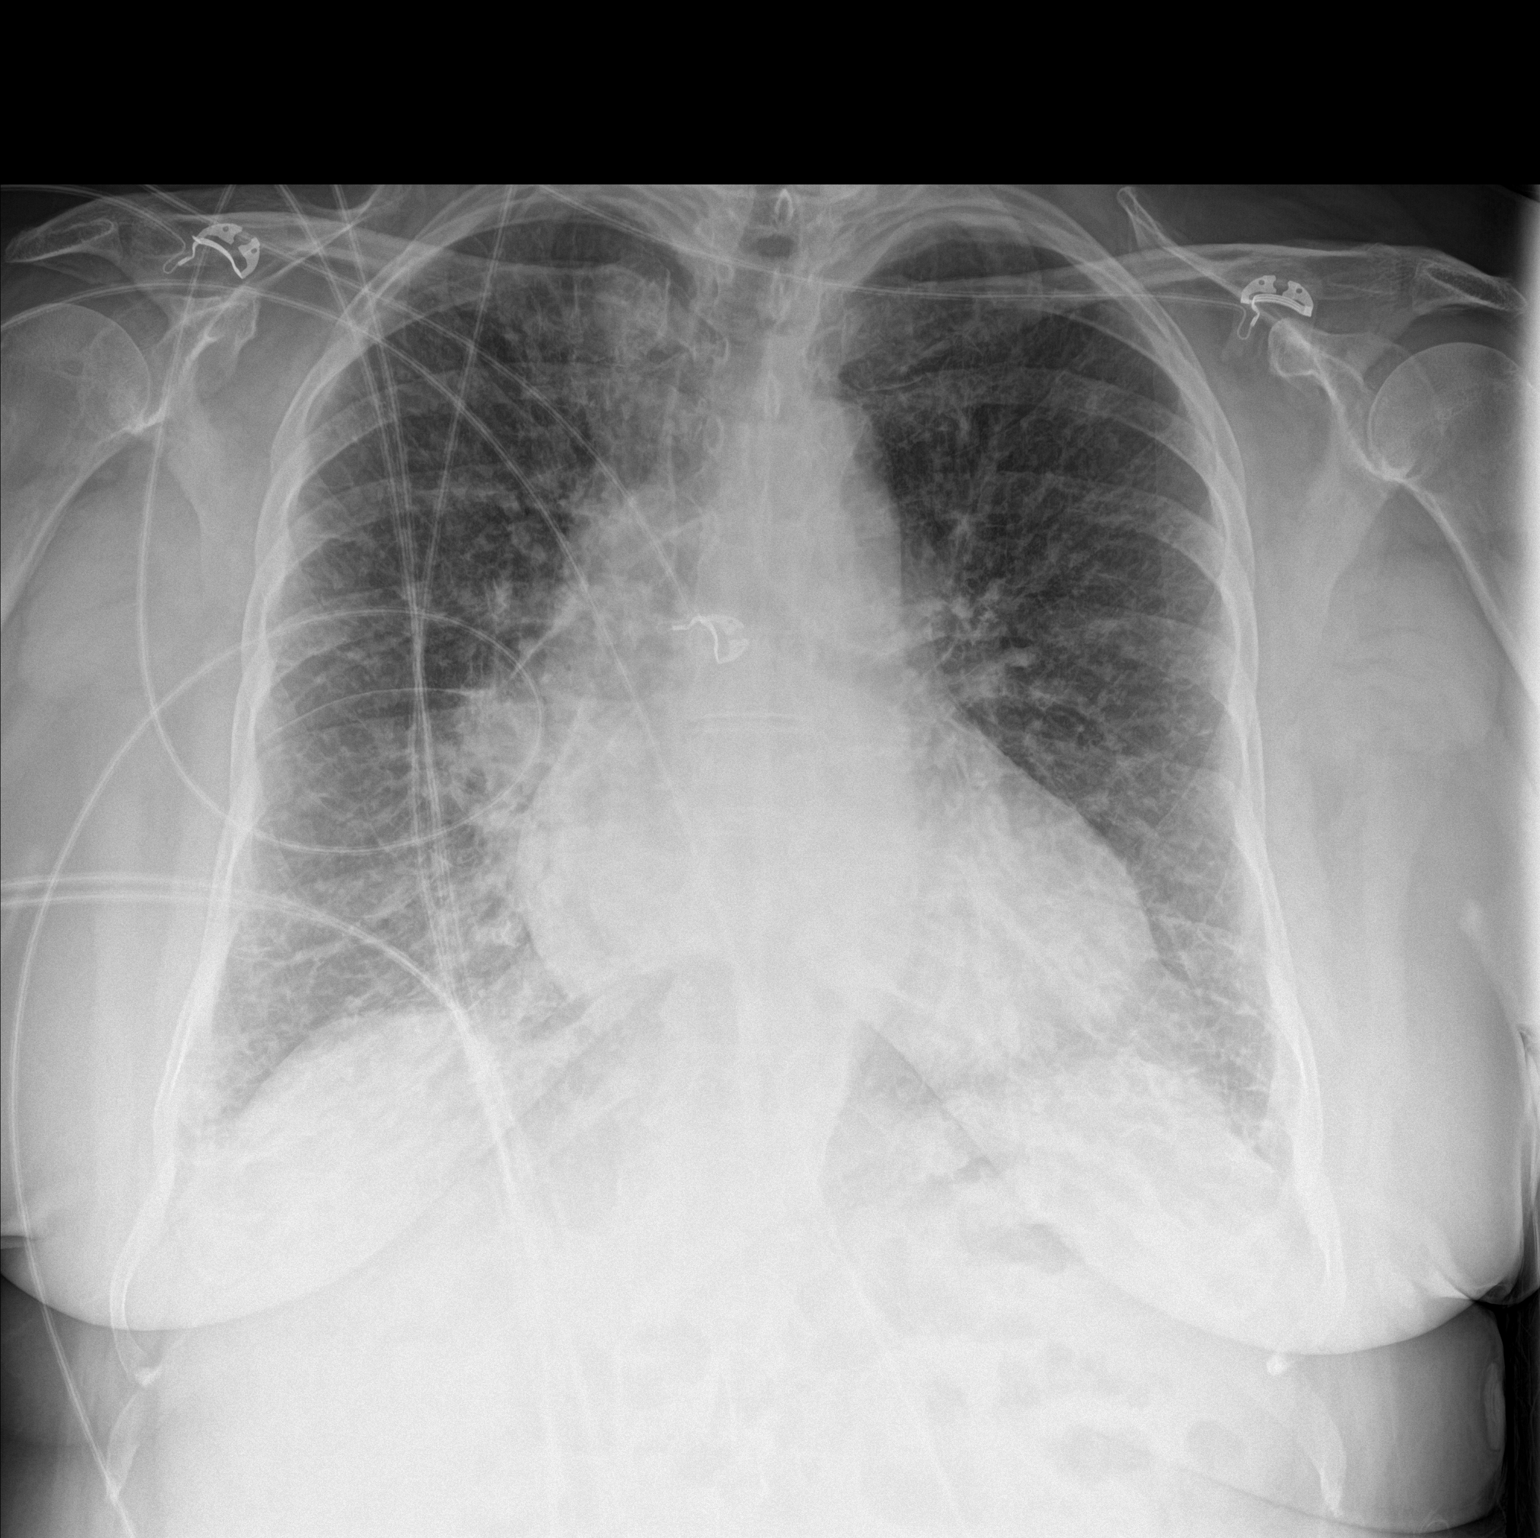

[chest lat]
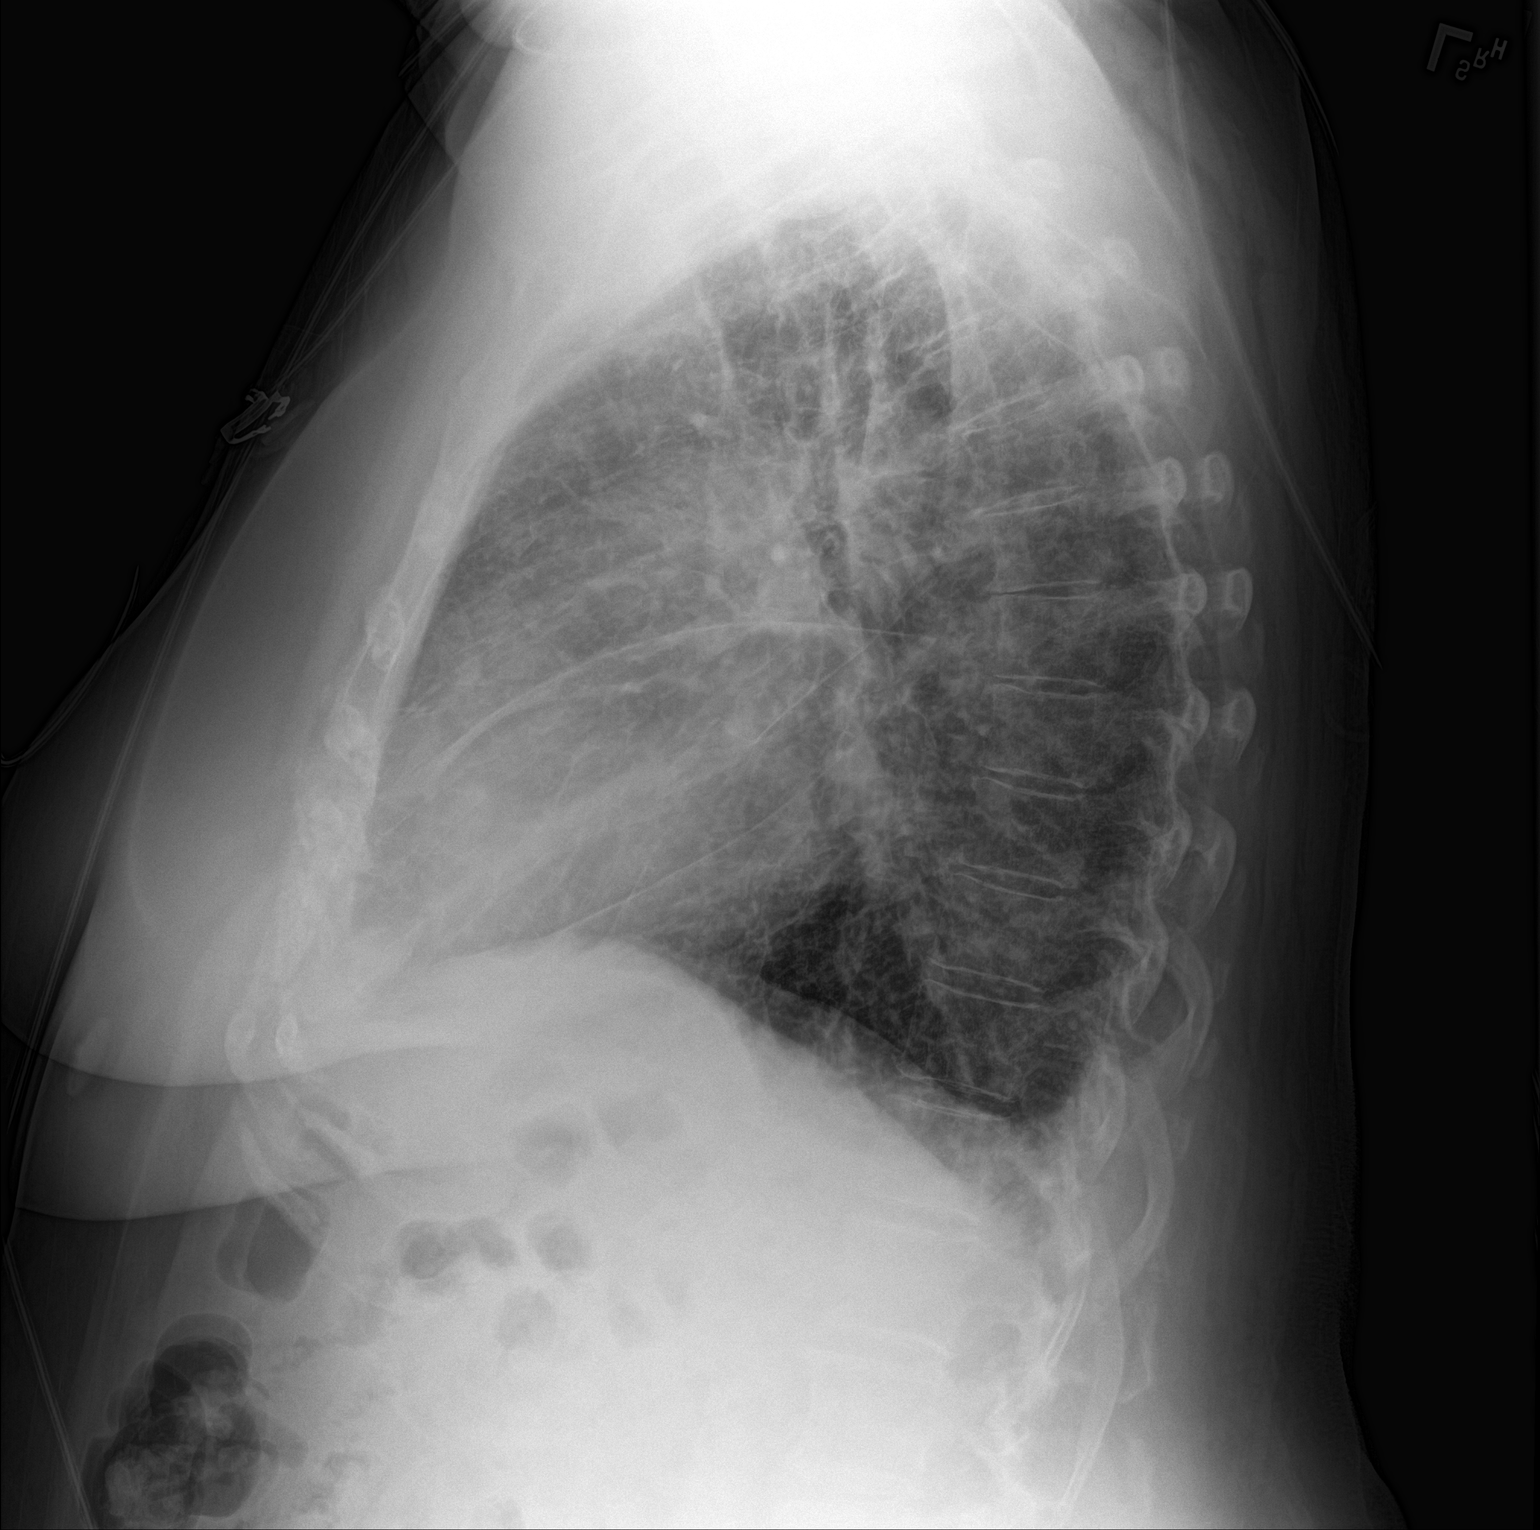

[2 of 2 positions shown; findings below may reference images not displayed]

FINDINGS: Diffuse interstitial prominence throughout the lungs. Heart is
mildly enlarged. No effusions. No acute bony abnormality.
IMPRESSION: Diffuse interstitial prominence throughout the lungs which could
reflect chronic interstitial disease. Chronicity is difficult to
determine without old films. Edema is possible.

## 2017-09-15 MED ORDER — DOFETILIDE 250 MCG PO CAPS: 250.0000 ug | ORAL_CAPSULE | Freq: Two times a day (BID) | ORAL | 6 refills | Status: DC

## 2017-09-15 MED ORDER — POTASSIUM CHLORIDE ER 10 MEQ PO TBCR: EXTENDED_RELEASE_TABLET | ORAL | 2 refills | Status: DC

## 2017-09-15 NOTE — Progress Notes (Signed)
.aPatient ID: Tracy Miles, female   DOB: [birth year], 84 y.o.   MRN: [id number]     Primary Care Physician: [name], MD Referring Physician: Dr. Lillette Boxer Mcswain is a 84 y.o. female with a h/o paroxysmal afib  on tikosyn for f/u. She reports no occurence of afib. Feels well.  Continues on Eliquis at 5 mg bid.  Today, she denies symptoms of palpitations, chest pain, shortness of breath, orthopnea, PND, lower extremity edema, dizziness, presyncope, syncope, or neurologic sequela. The patient is tolerating medications without difficulties and is otherwise without complaint today.   Past Medical History  Diagnosis Date  . Hypertension   . Heart murmur   . Coronary artery disease   . Atrial fibrillation ([place])   . Anemia   . Arthritis   . Colon polyp   . Personal history of DVT (deep vein thrombosis) X 2    "left"  . Hyperlipidemia   . Pancreatitis     post hysterectomy  . Sleep apnea   . Aortic stenosis     Very mild  . Hypercholesteremia   . OSA on CPAP   . PAF (paroxysmal atrial fibrillation) ([place])   . History of valvular heart disease     LEFT  . Chicken pox as a child  . Measles as a child  . Mumps child and teenager  . Allergy   . Obesity   . Unspecified constipation 11/16/2013  . Heme positive stool 11/16/2013  . DDD (degenerative disc disease) 11/16/2013  . Unspecified sleep apnea 11/19/2013  . Allergic state 11/19/2013  . OA (osteoarthritis) 11/19/2013    S/p L TKR  . Benign paroxysmal positional vertigo 12/17/2013  . Abnormal results of thyroid function studies 12/17/2013  . Iron deficiency anemia 11/13/2013  . Personal history of colonic polyps 02/25/2014  . Spinal stenosis   . Obstructive sleep apnea 02/22/2015  . Visit for monitoring Tikosyn therapy 08/11/2015  . Medicare annual wellness visit, subsequent 09/15/2015   Past Surgical History  Procedure Laterality Date  . Tonsilectomy, adenoidectomy, bilateral myringotomy and tubes  child  . Lumbar  laminectomy  42 yrs ago    "L3-4"  . Tubal ligation  84 years old  . Total abdominal hysterectomy  1995    had 2 tumors- benign  . Replacement total knee Left 2013  . Cyst removal hand Bilateral 1990's    "played to much golf"  . Meniscus repair Bilateral 2000 and 2010  . Appendectomy    . Tonsillectomy and adenoidectomy    . Joint replacement    . Back surgery    . Cataract extraction w/ intraocular lens  implant, bilateral Bilateral 2016  . Dilation and curettage of uterus    . Cardioversion N/A 08/09/2015    Procedure: CARDIOVERSION;  Surgeon: Jerline Pain, MD;  Location: [facility];  Service: Cardiovascular;  Laterality: N/A;  . Eye surgery  2016    cataracts    Current Outpatient Prescriptions  Medication Sig Dispense Refill  . acetaminophen (TYLENOL) 650 MG CR tablet Take 1,300 mg by mouth every evening.    Marland Kitchen apixaban (ELIQUIS) 5 MG TABS tablet Take 1 tablet (5 mg total) by mouth 2 (two) times daily. 180 tablet 2  . atorvastatin (LIPITOR) 20 MG tablet Take 1 tablet (20 mg total) by mouth daily. 90 tablet 2  . bumetanide (BUMEX) 1 MG tablet TAKE ONE (1) TABLET BY MOUTH EVERY DAY 90 tablet 0  . calcium carbonate (OS-CAL) 600 MG TABS tablet  Take 600 mg by mouth 2 (two) times daily with a meal.    . diltiazem (CARDIZEM CD) 240 MG 24 hr capsule Take 240 mg by mouth daily.    Marland Kitchen dofetilide (TIKOSYN) 250 MCG capsule Take 1 capsule (250 mcg total) by mouth 2 (two) times daily. 60 capsule 3  . Lactobacillus (PROBIOTIC ACIDOPHILUS) TABS Take 1 tablet by mouth daily. Reported on 03/04/2016    . lisinopril (PRINIVIL,ZESTRIL) 10 MG tablet Take 1 tablet (10 mg total) by mouth daily. 90 tablet 2  . loratadine (CLARITIN) 10 MG tablet Take 10 mg by mouth daily.     . potassium chloride (KLOR-CON 10) 10 MEQ tablet TAKE ONE (1) TABLET EACH DAY 90 tablet 2  . psyllium (REGULOID) 0.52 g capsule Take 0.52 g by mouth daily.     No current facility-administered medications for this encounter.     Allergies  Allergen Reactions  . Gabapentin Swelling  . Lactose Intolerance (Gi) Other (See Comments)    Bothers her stomach  . Zebeta [Bisoprolol Fumarate] Nausea Only  . Penicillins Rash    Social History   Social History  . Marital Status: Widowed    Spouse Name: N/A  . Number of Children: 3  . Years of Education: N/A   Occupational History  . retired Pharmacist, hospital    Social History Main Topics  . Smoking status: Never Smoker   . Smokeless tobacco: Never Used     Comment: never used tobacco  . Alcohol Use: No  . Drug Use: No  . Sexual Activity: No     Comment: lives [place], low sodium diet   Other Topics Concern  . Not on file   Social History Narrative    Family History  Problem Relation Age of Onset  . Heart attack Mother 7  . Hyperlipidemia Mother     ?  Marland Kitchen Dementia Mother   . Pernicious anemia Mother   . COPD Father     smoker  . Cancer Father 79    prostate  . Heart disease Brother     quadruple bipass surgery  . Hyperlipidemia Brother   . Hypertension Brother   . Diabetes Maternal Grandmother     type 2  . Pernicious anemia Maternal Grandmother   . Colon cancer Neg Hx   . Pancreatic cancer Neg Hx   . Stomach cancer Neg Hx   . Throat cancer Neg Hx   . Liver disease Neg Hx   . Gout Son   . Atrial fibrillation Son   . Hyperlipidemia Son   . Cancer Maternal Grandfather     liver  . Cancer Paternal Grandmother     lung- doesn't think she smokes?  . Stroke Paternal Grandfather   . Cancer Son 50    non hodgin's lymphoma  . Gout Son   . Hyperlipidemia Son   . Sleep apnea Son     ROS- All systems are reviewed and negative except as per the HPI above  Physical Exam: Filed Vitals:   03/04/16 1351  BP: 138/74  Pulse: 75  Height: 5' 3.5" (1.613 m)  Weight: 260 lb 12.8 oz (118.298 kg)    GEN- The patient is well appearing, alert and oriented x 3 today.   Head- normocephalic, atraumatic Eyes-  Sclera clear, conjunctiva  pink Ears- hearing intact Oropharynx- clear Neck- supple, no JVP Lymph- no cervical lymphadenopathy Lungs- Clear to ausculation bilaterally, normal work of breathing Heart- Regular rate and rhythm, no murmurs, rubs  or gallops, PMI not laterally displaced GI- soft, NT, ND, + BS Extremities- no clubbing, cyanosis, or edema MS- no significant deformity or atrophy Skin- no rash or lesion Psych- euthymic mood, full affect Neuro- strength and sensation are intact  EKG- NSR at 76 bpm, Pr int 172 ms, QRS int 76 ms, Qtc 454 ms(qtc is stable) Epic records reviewed   Assessment and Plan: 1. Afib Doing well on tikosyn, continue 250 mg bid, reports no recent afib Continue daily cardizem with 30 mg as needed for bouts of afib Continue apixaban with chadsvasc score of 5 Bmet/mag pending  F/u in 4 months  PCP as scheduled  Butch Penny C. Susette Seminara, [facility] [address] [place], [place] [address] [phone]

## 2017-10-21 ENCOUNTER — Other Ambulatory Visit: Payer: Self-pay | Admitting: Family Medicine

## 2017-11-09 ENCOUNTER — Ambulatory Visit: Payer: Medicare Other | Admitting: Family Medicine

## 2017-11-19 ENCOUNTER — Other Ambulatory Visit (HOSPITAL_COMMUNITY): Payer: Self-pay | Admitting: *Deleted

## 2017-11-19 MED ORDER — POTASSIUM CHLORIDE ER 10 MEQ PO TBCR: EXTENDED_RELEASE_TABLET | ORAL | 2 refills | Status: DC

## 2017-11-22 ENCOUNTER — Ambulatory Visit (INDEPENDENT_AMBULATORY_CARE_PROVIDER_SITE_OTHER): Payer: Medicare Other | Admitting: Family Medicine

## 2017-11-22 VITALS — BP 132/78 | HR 78 | Temp 97.9°F | Resp 18 | Wt 271.0 lb

## 2017-11-22 DIAGNOSIS — R3915 Urgency of urination: Secondary | ICD-10-CM

## 2017-11-22 DIAGNOSIS — N3001 Acute cystitis with hematuria: Secondary | ICD-10-CM | POA: Diagnosis not present

## 2017-11-22 DIAGNOSIS — E6609 Other obesity due to excess calories: Secondary | ICD-10-CM | POA: Diagnosis not present

## 2017-11-22 DIAGNOSIS — I10 Essential (primary) hypertension: Secondary | ICD-10-CM | POA: Diagnosis not present

## 2017-11-22 DIAGNOSIS — G4733 Obstructive sleep apnea (adult) (pediatric): Secondary | ICD-10-CM | POA: Diagnosis not present

## 2017-11-22 DIAGNOSIS — E782 Mixed hyperlipidemia: Secondary | ICD-10-CM

## 2017-11-22 DIAGNOSIS — R350 Frequency of micturition: Secondary | ICD-10-CM

## 2017-11-22 DIAGNOSIS — R739 Hyperglycemia, unspecified: Secondary | ICD-10-CM | POA: Diagnosis not present

## 2017-11-22 LAB — CBC
HCT: 39.4 % (ref 36.0–46.0)
Hemoglobin: 13.6 g/dL (ref 12.0–15.0)
MCHC: 34.6 g/dL (ref 30.0–36.0)
MCV: 93.9 fl (ref 78.0–100.0)
Platelets: 241 10*3/uL (ref 150.0–400.0)
RBC: 4.2 Mil/uL (ref 3.87–5.11)
RDW: 12.8 % (ref 11.5–15.5)
WBC: 5.7 10*3/uL (ref 4.0–10.5)

## 2017-11-22 LAB — URINALYSIS, ROUTINE W REFLEX MICROSCOPIC
BILIRUBIN URINE: NEGATIVE
HGB URINE DIPSTICK: NEGATIVE
KETONES UR: NEGATIVE
NITRITE: NEGATIVE
PH: 6.5 (ref 5.0–8.0)
RBC / HPF: NONE SEEN (ref 0–?)
Specific Gravity, Urine: 1.01 (ref 1.000–1.030)
TOTAL PROTEIN, URINE-UPE24: NEGATIVE
UROBILINOGEN UA: 0.2 (ref 0.0–1.0)
Urine Glucose: NEGATIVE

## 2017-11-22 LAB — COMPREHENSIVE METABOLIC PANEL
ALBUMIN: 4.2 g/dL (ref 3.5–5.2)
ALK PHOS: 80 U/L (ref 39–117)
ALT: 10 U/L (ref 0–35)
AST: 14 U/L (ref 0–37)
BILIRUBIN TOTAL: 0.7 mg/dL (ref 0.2–1.2)
BUN: 18 mg/dL (ref 6–23)
CO2: 32 mEq/L (ref 19–32)
Calcium: 10.1 mg/dL (ref 8.4–10.5)
Chloride: 101 mEq/L (ref 96–112)
Creatinine, Ser: 0.92 mg/dL (ref 0.40–1.20)
GFR: 62.17 mL/min (ref >60.00)
GLUCOSE: 114 mg/dL — AB (ref 70–99)
Potassium: 4.5 mEq/L (ref 3.5–5.1)
Sodium: 140 mEq/L (ref 135–145)
TOTAL PROTEIN: 7.3 g/dL (ref 6.0–8.3)

## 2017-11-22 LAB — LIPID PANEL
CHOLESTEROL: 148 mg/dL (ref 0–200)
HDL: 44.5 mg/dL (ref >39.00)
LDL Cholesterol: 78 mg/dL (ref 0–99)
NonHDL: 103.94
Total CHOL/HDL Ratio: 3
Triglycerides: 132 mg/dL (ref 0.0–149.0)
VLDL: 26.4 mg/dL (ref 0.0–40.0)

## 2017-11-22 LAB — TSH: TSH: 2.21 u[IU]/mL (ref 0.35–4.50)

## 2017-11-22 LAB — HEMOGLOBIN A1C: HEMOGLOBIN A1C: 5.8 % (ref 4.6–6.5)

## 2017-11-22 MED ORDER — LISINOPRIL 10 MG PO TABS: 10.0000 mg | ORAL_TABLET | Freq: Every day | ORAL | 1 refills | Status: DC

## 2017-11-22 MED ORDER — APIXABAN 5 MG PO TABS: 5.0000 mg | ORAL_TABLET | Freq: Two times a day (BID) | ORAL | 1 refills | Status: DC

## 2017-11-22 MED ORDER — BUMETANIDE 1 MG PO TABS: ORAL_TABLET | ORAL | 2 refills | Status: DC

## 2017-11-22 MED ORDER — ATORVASTATIN CALCIUM 20 MG PO TABS: 20.0000 mg | ORAL_TABLET | Freq: Every day | ORAL | 2 refills | Status: DC

## 2017-11-22 NOTE — Assessment & Plan Note (Addendum)
Encouraged DASH MIND diet, decrease po intake and increase exercise as tolerated. Needs 7-8 hours of sleep nightly. Avoid trans fats, eat small, frequent meals every 4-5 hours with lean proteins, complex carbs and healthy fats. Minimize simple carbs

## 2017-11-22 NOTE — Assessment & Plan Note (Signed)
Check UA and urinalysis

## 2017-11-22 NOTE — Progress Notes (Signed)
Subjective:  I acted as a Education administrator for Dr. Charlett Blake. [place], [place]   Patient ID: Tracy Miles, female    DOB: [birth year], 84 y.o.   MRN: [id number]  No chief complaint on file.   HPI  Patient is in today for a 6 month follow up and overall she is doing well. No recent febrile illness or hospitalizations. She does note some urinary urgency but no dysuria. She also notes a small unchanging raised spot on the back of her head. nontender and not draining. Notes some polydipsia. Denies CP/palp/SOB/HA/congestion/fevers/GI c/o. Taking meds as prescribed  Patient Care Team: [name], MD as PCP - General (Family Medicine) Clayborne Artist, NP as Nurse Practitioner (Cardiology) Rigoberto Noel, MD as Consulting Physician (Pulmonary Disease) Melrose Nakayama, MD as Consulting Physician (Orthopedic Surgery)   Past Medical History:  Diagnosis Date  . Anemia   . Aortic stenosis    Very mild  . Benign paroxysmal positional vertigo 12/17/2013  . Chicken pox as a child  . Colon polyp   . Coronary artery disease   . DDD (degenerative disc disease) 11/16/2013  . Hyperlipidemia   . Hypertension   . Iron deficiency anemia 11/13/2013  . Mumps child and teenager  . OA (osteoarthritis) 11/19/2013   S/p L TKR  . Obesity   . OSA on CPAP   . Pancreatitis    post hysterectomy  . Persistent atrial fibrillation ([place])   . Personal history of colonic polyps 02/25/2014  . Personal history of DVT (deep vein thrombosis) X 2   "left"  . Sleep apnea   . Spinal stenosis     Past Surgical History:  Procedure Laterality Date  . APPENDECTOMY    . CARDIOVERSION N/A 08/09/2015   Procedure: CARDIOVERSION;  Surgeon: Jerline Pain, MD;  Location: [facility];  Service: Cardiovascular;  Laterality: N/A;  . CATARACT EXTRACTION W/ INTRAOCULAR LENS  IMPLANT, BILATERAL Bilateral 2016  . CYST REMOVAL HAND Bilateral 1990's   "played to much golf"  . DILATION AND CURETTAGE OF UTERUS    . LUMBAR LAMINECTOMY  40 yrs  ago   "L3-4"  . MENISCUS REPAIR Bilateral 2000 and 2010  . REPLACEMENT TOTAL KNEE Left 2013  . TONSILECTOMY, ADENOIDECTOMY, BILATERAL MYRINGOTOMY AND TUBES  child  . TOTAL ABDOMINAL HYSTERECTOMY  1995   had 2 tumors- benign  . TUBAL LIGATION  84 years old    Family History  Problem Relation Age of Onset  . Heart attack Mother 70  . Hyperlipidemia Mother        ?  Marland Kitchen Dementia Mother   . Pernicious anemia Mother   . COPD Father        smoker  . Cancer Father 49       prostate  . Heart disease Brother        quadruple bipass surgery  . Hyperlipidemia Brother   . Hypertension Brother   . Diabetes Maternal Grandmother        type 2  . Pernicious anemia Maternal Grandmother   . Gout Son   . Atrial fibrillation Son   . Hyperlipidemia Son   . Cancer Maternal Grandfather        liver  . Cancer Paternal Grandmother        lung- doesn't think she smokes?  . Stroke Paternal Grandfather   . Cancer Son 50       non hodgin's lymphoma  . Gout Son   . Hyperlipidemia Son   . Sleep  apnea Son   . Colon cancer Neg Hx   . Pancreatic cancer Neg Hx   . Stomach cancer Neg Hx   . Throat cancer Neg Hx   . Liver disease Neg Hx     Social History   Socioeconomic History  . Marital status: Widowed    Spouse name: Not on file  . Number of children: 3  . Years of education: Not on file  . Highest education level: Not on file  Occupational History  . Occupation: retired Tour manager  . Financial resource strain: Not on file  . Food insecurity:    Worry: Not on file    Inability: Not on file  . Transportation needs:    Medical: Not on file    Non-medical: Not on file  Tobacco Use  . Smoking status: Never Smoker  . Smokeless tobacco: Never Used  . Tobacco comment: never used tobacco  Substance and Sexual Activity  . Alcohol use: No  . Drug use: No  . Sexual activity: Never    Comment: lives [place], low sodium diet  Lifestyle  . Physical activity:    Days  per week: Not on file    Minutes per session: Not on file  . Stress: Not on file  Relationships  . Social connections:    Talks on phone: Not on file    Gets together: Not on file    Attends religious service: Not on file    Active member of club or organization: Not on file    Attends meetings of clubs or organizations: Not on file    Relationship status: Not on file  . Intimate partner violence:    Fear of current or ex partner: Not on file    Emotionally abused: Not on file    Physically abused: Not on file    Forced sexual activity: Not on file  Other Topics Concern  . Not on file  Social History Narrative  . Not on file    Outpatient Medications Prior to Visit  Medication Sig Dispense Refill  . acetaminophen (TYLENOL) 325 MG tablet Take 325 mg by mouth daily.     . calcium carbonate (OS-CAL) 600 MG TABS tablet Take 600 mg by mouth 2 (two) times daily with a meal.    . diltiazem (CARDIZEM CD) 120 MG 24 hr capsule Take 1 capsule (120 mg total) by mouth daily. 30 capsule 6  . diltiazem (CARDIZEM) 30 MG tablet Take 1 tablet every 4 hours AS NEEDED for heart rate >100 as long as blood pressure >100. 45 tablet 3  . dofetilide (TIKOSYN) 250 MCG capsule Take 1 capsule (250 mcg total) by mouth 2 (two) times daily. 60 capsule 6  . lactobacillus acidophilus (BACID) TABS tablet Take 1 tablet by mouth daily.     Marland Kitchen loratadine (CLARITIN) 10 MG tablet Take 10 mg by mouth daily.     . potassium chloride (KLOR-CON 10) 10 MEQ tablet Take one tablet in the morning and two tablets in the pm 270 tablet 2  . psyllium (REGULOID) 0.52 g capsule Take 0.52 g by mouth 2 (two) times daily.     Marland Kitchen apixaban (ELIQUIS) 5 MG TABS tablet Take 5 mg by mouth 2 (two) times daily.    Marland Kitchen atorvastatin (LIPITOR) 20 MG tablet Take 1 tablet (20 mg total) by mouth daily at 6 PM. 90 tablet 1  . bumetanide (BUMEX) 1 MG tablet TAKE ONE (1) TABLET BY MOUTH EACH DAY  90 tablet 0  . lisinopril (PRINIVIL,ZESTRIL) 10 MG tablet  Take 1 tablet (10 mg total) by mouth daily. 90 tablet 1   No facility-administered medications prior to visit.      Review of Systems  Constitutional: Positive for malaise/fatigue. Negative for fever.  HENT: Negative for congestion.   Eyes: Negative for blurred vision.  Respiratory: Negative for shortness of breath.   Cardiovascular: Negative for chest pain, palpitations and leg swelling.  Gastrointestinal: Negative for abdominal pain, blood in stool and nausea.  Genitourinary: Positive for urgency. Negative for dysuria and frequency.  Musculoskeletal: Negative for falls.  Skin: Negative for rash.  Neurological: Negative for dizziness, loss of consciousness and headaches.  Endo/Heme/Allergies: Negative for environmental allergies.  Psychiatric/Behavioral: Negative for depression. The patient is not nervous/anxious.        Objective:    Physical Exam  Constitutional: She is oriented to person, place, and time. She appears well-developed and well-nourished. No distress.  HENT:  Head: Normocephalic and atraumatic.  Nose: Nose normal.  Eyes: Right eye exhibits no discharge. Left eye exhibits no discharge.  Neck: Normal range of motion. Neck supple.  Cardiovascular: Normal rate and regular rhythm.  No murmur heard. Pulmonary/Chest: Effort normal and breath sounds normal.  Abdominal: Soft. Bowel sounds are normal. There is no tenderness.  Musculoskeletal: She exhibits no edema.  Neurological: She is alert and oriented to person, place, and time.  Skin: Skin is warm and dry.  1 cm raised nontender, nonerythematous lesion on occiput.  Psychiatric: She has a normal mood and affect.  Nursing note and vitals reviewed.   BP 132/78 (BP Location: Left Arm, Patient Position: Sitting, Cuff Size: Normal)   Pulse 78   Temp 97.9 F (36.6 C) (Oral)   Resp 18   Wt 271 lb (122.9 kg)   SpO2 97%   BMI 48.01 kg/m  Wt Readings from Last 3 Encounters:  11/22/17 271 lb (122.9 kg)  09/15/17  271 lb 9.6 oz (123.2 kg)  07/16/17 270 lb 6.4 oz (122.7 kg)   BP Readings from Last 3 Encounters:  11/22/17 132/78  09/15/17 126/64  07/16/17 126/84     Immunization History  Administered Date(s) Administered  . Influenza, High Dose Seasonal PF 05/11/2017  . Influenza,inj,Quad PF,6+ Mos 04/30/2016  . Influenza-Unspecified 05/31/2013, 05/01/2014, 06/11/2015  . Pneumococcal Conjugate-13 04/02/2014  . Pneumococcal Polysaccharide-23 08/31/1998, 09/06/2015  . Tdap 07/31/2012    Health Maintenance  Topic Date Due  . COLONOSCOPY  11/22/2018  . TETANUS/TDAP  07/31/2022  . INFLUENZA VACCINE  Completed  . DEXA SCAN  Completed  . PNA vac Low Risk Adult  Completed    Lab Results  Component Value Date   WBC 5.7 11/22/2017   HGB 13.6 11/22/2017   HCT 39.4 11/22/2017   PLT 241.0 11/22/2017   GLUCOSE 114 (H) 11/22/2017   CHOL 148 11/22/2017   TRIG 132.0 11/22/2017   HDL 44.50 11/22/2017   LDLCALC 78 11/22/2017   ALT 10 11/22/2017   AST 14 11/22/2017   NA 140 11/22/2017   K 4.5 11/22/2017   CL 101 11/22/2017   CREATININE 0.92 11/22/2017   BUN 18 11/22/2017   CO2 32 11/22/2017   TSH 2.21 11/22/2017   INR 1.55 (H) 08/06/2015   HGBA1C 5.8 11/22/2017    Lab Results  Component Value Date   TSH 2.21 11/22/2017   Lab Results  Component Value Date   WBC 5.7 11/22/2017   HGB 13.6 11/22/2017   HCT 39.4 11/22/2017  MCV 93.9 11/22/2017   PLT 241.0 11/22/2017   Lab Results  Component Value Date   NA 140 11/22/2017   K 4.5 11/22/2017   CO2 32 11/22/2017   GLUCOSE 114 (H) 11/22/2017   BUN 18 11/22/2017   CREATININE 0.92 11/22/2017   BILITOT 0.7 11/22/2017   ALKPHOS 80 11/22/2017   AST 14 11/22/2017   ALT 10 11/22/2017   PROT 7.3 11/22/2017   ALBUMIN 4.2 11/22/2017   CALCIUM 10.1 11/22/2017   ANIONGAP 10 09/15/2017   GFR 62.17 11/22/2017   Lab Results  Component Value Date   CHOL 148 11/22/2017   Lab Results  Component Value Date   HDL 44.50 11/22/2017   Lab  Results  Component Value Date   LDLCALC 78 11/22/2017   Lab Results  Component Value Date   TRIG 132.0 11/22/2017   Lab Results  Component Value Date   CHOLHDL 3 11/22/2017   Lab Results  Component Value Date   HGBA1C 5.8 11/22/2017         Assessment & Plan:   Problem List Items Addressed This Visit    Essential hypertension    Well controlled, no changes to meds. Encouraged heart healthy diet such as the DASH diet and exercise as tolerated.       Relevant Medications   atorvastatin (LIPITOR) 20 MG tablet   bumetanide (BUMEX) 1 MG tablet   lisinopril (PRINIVIL,ZESTRIL) 10 MG tablet   apixaban (ELIQUIS) 5 MG TABS tablet   Other Relevant Orders   CBC (Completed)   Comprehensive metabolic panel (Completed)   TSH (Completed)   Hyperlipidemia, mixed    Tolerating statin, encouraged heart healthy diet, avoid trans fats, minimize simple carbs and saturated fats. Increase exercise as tolerated      Relevant Medications   atorvastatin (LIPITOR) 20 MG tablet   bumetanide (BUMEX) 1 MG tablet   lisinopril (PRINIVIL,ZESTRIL) 10 MG tablet   apixaban (ELIQUIS) 5 MG TABS tablet   Other Relevant Orders   Lipid panel (Completed)   Obesity    Encouraged DASH MIND diet, decrease po intake and increase exercise as tolerated. Needs 7-8 hours of sleep nightly. Avoid trans fats, eat small, frequent meals every 4-5 hours with lean proteins, complex carbs and healthy fats. Minimize simple carbs      Urinary tract infection with hematuria    Repeat UA and culture does not show UTI. Maintain adequate hydration and consider cranberry and probiotic tabs.       Obstructive sleep apnea    Follows with Dr Elsworth Soho, uses CPAP      Hyperglycemia    minimize simple carbs. Increase exercise as tolerated.       Relevant Orders   Hemoglobin A1c (Completed)   Urinary urgency    Check UA and urinalysis      Relevant Orders   Urinalysis   Urine Culture    Other Visit Diagnoses    Urine  frequency    -  Primary   Relevant Orders   Urine Culture (Completed)   Urinalysis      I have changed Benjamine Mola Shorb "Betty"'s apixaban. I am also having her maintain her calcium carbonate, loratadine, psyllium, acetaminophen, diltiazem, lactobacillus acidophilus, diltiazem, dofetilide, potassium chloride, atorvastatin, bumetanide, and lisinopril.  Meds ordered this encounter  Medications  . atorvastatin (LIPITOR) 20 MG tablet    Sig: Take 1 tablet (20 mg total) by mouth daily at 6 PM.    Dispense:  90 tablet    Refill:  2  .  bumetanide (BUMEX) 1 MG tablet    Sig: TAKE ONE (1) TABLET BY MOUTH EACH DAY    Dispense:  90 tablet    Refill:  2  . lisinopril (PRINIVIL,ZESTRIL) 10 MG tablet    Sig: Take 1 tablet (10 mg total) by mouth daily.    Dispense:  90 tablet    Refill:  1  . apixaban (ELIQUIS) 5 MG TABS tablet    Sig: Take 1 tablet (5 mg total) by mouth 2 (two) times daily.    Dispense:  90 tablet    Refill:  1    CMA served as Education administrator during this visit. History, Physical and Plan performed by medical provider. Documentation and orders reviewed and attested to.  [name], MD

## 2017-11-22 NOTE — Assessment & Plan Note (Signed)
Tolerating statin, encouraged heart healthy diet, avoid trans fats, minimize simple carbs and saturated fats. Increase exercise as tolerated 

## 2017-11-22 NOTE — Assessment & Plan Note (Signed)
Well controlled, no changes to meds. Encouraged heart healthy diet such as the DASH diet and exercise as tolerated.  °

## 2017-11-22 NOTE — Patient Instructions (Addendum)
DASH or MIND diet  Shingrix is the new shingles shot 2 shots over 2-6 months at [facility]  Lipoma A lipoma is a noncancerous (benign) tumor that is made up of fat cells. This is a very common type of soft-tissue growth. Lipomas are usually found under the skin (subcutaneous). They may occur in any tissue of the body that contains fat. Common areas for lipomas to appear include the back, shoulders, buttocks, and thighs. Lipomas grow slowly, and they are usually painless. Most lipomas do not cause problems and do not require treatment. What are the causes? The cause of this condition is not known. What increases the risk? This condition is more likely to develop in:  People who are 40-37 years old.  People who have a family history of lipomas.  What are the signs or symptoms? A lipoma usually appears as a small, round bump under the skin. It may feel soft or rubbery, but the firmness can vary. Most lipomas are not painful. However, a lipoma may become painful if it is located in an area where it pushes on nerves. How is this diagnosed? A lipoma can usually be diagnosed with a physical exam. You may also have tests to confirm the diagnosis and to rule out other conditions. Tests may include:  Imaging tests, such as a CT scan or MRI.  Removal of a tissue sample to be looked at under a microscope (biopsy).  How is this treated? Treatment is not needed for small lipomas that are not causing problems. If a lipoma continues to get bigger or it causes problems, removal is often the best option. Lipomas can also be removed to improve appearance. Removal of a lipoma is usually done with a surgery in which the fatty cells and the surrounding capsule are removed. Most often, a medicine that numbs the area (local anesthetic) is used for this procedure. Follow these instructions at home:  Keep all follow-up visits as directed by your health care provider. This is  important. Contact a health care provider if:  Your lipoma becomes larger or hard.  Your lipoma becomes painful, red, or increasingly swollen. These could be signs of infection or a more serious condition. This information is not intended to replace advice given to you by your health care provider. Make sure you discuss any questions you have with your health care provider. Document Released: 08/07/2002 Document Revised: 01/23/2016 Document Reviewed: 08/13/2014 Elsevier Interactive Patient Education  [employer].

## 2017-11-22 NOTE — Assessment & Plan Note (Signed)
minimize simple carbs. Increase exercise as tolerated.  

## 2017-11-22 NOTE — Assessment & Plan Note (Signed)
Follows with Dr Elsworth Soho, uses CPAP

## 2017-11-23 LAB — URINE CULTURE
MICRO NUMBER: 90369760
SPECIMEN QUALITY:: ADEQUATE

## 2017-11-27 NOTE — Assessment & Plan Note (Signed)
Repeat UA and culture does not show UTI. Maintain adequate hydration and consider cranberry and probiotic tabs.

## 2017-11-29 ENCOUNTER — Telehealth: Payer: Self-pay | Admitting: Family Medicine

## 2017-11-29 NOTE — Telephone Encounter (Signed)
Copied from [place] [phone]. Topic: Quick Communication - See Telephone Encounter >> Nov 29, 2017 11:37 AM Synthia Innocent wrote: CRM for notification. See Telephone encounter for: 11/29/17. Need RX for Shringix injection to have a pharmacy. Please advise

## 2017-11-30 MED ORDER — ZOSTER VAC RECOMB ADJUVANTED 50 MCG/0.5ML IM SUSR: 0.5000 mL | Freq: Once | INTRAMUSCULAR | 1 refills | Status: AC

## 2017-11-30 MED ORDER — ZOSTER VAC RECOMB ADJUVANTED 50 MCG/0.5ML IM SUSR: 0.5000 mL | Freq: Once | INTRAMUSCULAR | 1 refills | Status: DC

## 2017-11-30 NOTE — Addendum Note (Signed)
Addended by: Magdalene Molly A on: 11/30/2017 05:14 PM   Modules accepted: Orders

## 2017-11-30 NOTE — Telephone Encounter (Signed)
Spoke to patient she states all she needs was a rx sent to [facility]

## 2017-11-30 NOTE — Telephone Encounter (Signed)
rx filled and ready for pick up

## 2017-11-30 NOTE — Telephone Encounter (Signed)
rx sent

## 2018-01-12 ENCOUNTER — Ambulatory Visit (HOSPITAL_COMMUNITY)
Admission: RE | Admit: 2018-01-12 | Discharge: 2018-01-12 | Disposition: A | Discharge status: Home / Self Care | Payer: Medicare Other | Source: Ambulatory Visit | Attending: Nurse Practitioner | Admitting: Nurse Practitioner

## 2018-01-12 ENCOUNTER — Encounter (HOSPITAL_COMMUNITY): Payer: Self-pay | Admitting: Nurse Practitioner

## 2018-01-12 VITALS — BP 128/74 | HR 73 | Ht 63.0 in | Wt 272.0 lb

## 2018-01-12 DIAGNOSIS — R9431 Abnormal electrocardiogram [ECG] [EKG]: Secondary | ICD-10-CM | POA: Diagnosis not present

## 2018-01-12 DIAGNOSIS — R0989 Other specified symptoms and signs involving the circulatory and respiratory systems: Secondary | ICD-10-CM

## 2018-01-12 DIAGNOSIS — I4891 Unspecified atrial fibrillation: Secondary | ICD-10-CM | POA: Diagnosis present

## 2018-01-12 DIAGNOSIS — I480 Paroxysmal atrial fibrillation: Secondary | ICD-10-CM | POA: Diagnosis not present

## 2018-01-12 LAB — BASIC METABOLIC PANEL
Anion gap: 9 (ref 5–15)
BUN: 19 mg/dL (ref 6–20)
CALCIUM: 9.6 mg/dL (ref 8.9–10.3)
CO2: 31 mmol/L (ref 22–32)
Chloride: 103 mmol/L (ref 101–111)
Creatinine, Ser: 0.99 mg/dL (ref 0.44–1.00)
GFR calc Af Amer: >60 mL/min (ref >60)
GFR, EST NON AFRICAN AMERICAN: 52 mL/min — AB (ref >60)
GLUCOSE: 114 mg/dL — AB (ref 65–99)
Potassium: 4 mmol/L (ref 3.5–5.1)
SODIUM: 143 mmol/L (ref 135–145)

## 2018-01-12 LAB — MAGNESIUM: MAGNESIUM: 2 mg/dL (ref 1.7–2.4)

## 2018-01-12 NOTE — Progress Notes (Signed)
.aPatient ID: Tracy Miles, female   DOB: 1934-02-15, 84 y.o.   MRN: [id number]     Primary Care Physician: [name], MD Referring Physician: Dr. Lillette Boxer [name] is a 84 y.o. female with a h/o paroxysmal afib  on tikosyn for f/u. She reports no occurence of afib.  Continues on Eliquis at 5 mg bid. Mentions that she has noted some lightheadedness mostly in the am and feeling slightly off balance, no falls, noted around 2 weeks ago.Nothing  has really changed in her health. Is still using cpap. No issues with Eliquis.  Today, she denies symptoms of palpitations, chest pain, shortness of breath, orthopnea, PND, lower extremity edema, dizziness, presyncope, syncope, or neurologic sequela. The patient is tolerating medications without difficulties and is otherwise without complaint today.   Past Medical History  Diagnosis Date  . Hypertension   . Heart murmur   . Coronary artery disease   . Atrial fibrillation ([place])   . Anemia   . Arthritis   . Colon polyp   . Personal history of DVT (deep vein thrombosis) X 2    "left"  . Hyperlipidemia   . Pancreatitis     post hysterectomy  . Sleep apnea   . Aortic stenosis     Very mild  . Hypercholesteremia   . OSA on CPAP   . PAF (paroxysmal atrial fibrillation) ([place])   . History of valvular heart disease     LEFT  . Chicken pox as a child  . Measles as a child  . Mumps child and teenager  . Allergy   . Obesity   . Unspecified constipation 11/16/2013  . Heme positive stool 11/16/2013  . DDD (degenerative disc disease) 11/16/2013  . Unspecified sleep apnea 11/19/2013  . Allergic state 11/19/2013  . OA (osteoarthritis) 11/19/2013    S/p L TKR  . Benign paroxysmal positional vertigo 12/17/2013  . Abnormal results of thyroid function studies 12/17/2013  . Iron deficiency anemia 11/13/2013  . Personal history of colonic polyps 02/25/2014  . Spinal stenosis   . Obstructive sleep apnea 02/22/2015  . Visit for monitoring Tikosyn  therapy 08/11/2015  . Medicare annual wellness visit, subsequent 09/15/2015   Past Surgical History  Procedure Laterality Date  . Tonsilectomy, adenoidectomy, bilateral myringotomy and tubes  child  . Lumbar laminectomy  42 yrs ago    "L3-4"  . Tubal ligation  84 years old  . Total abdominal hysterectomy  1995    had 2 tumors- benign  . Replacement total knee Left 2013  . Cyst removal hand Bilateral 1990's    "played to much golf"  . Meniscus repair Bilateral 2000 and 2010  . Appendectomy    . Tonsillectomy and adenoidectomy    . Joint replacement    . Back surgery    . Cataract extraction w/ intraocular lens  implant, bilateral Bilateral 2016  . Dilation and curettage of uterus    . Cardioversion N/A 08/09/2015    Procedure: CARDIOVERSION;  Surgeon: Jerline Pain, MD;  Location: [facility];  Service: Cardiovascular;  Laterality: N/A;  . Eye surgery  2016    cataracts    Current Outpatient Prescriptions  Medication Sig Dispense Refill  . acetaminophen (TYLENOL) 650 MG CR tablet Take 1,300 mg by mouth every evening.    Marland Kitchen apixaban (ELIQUIS) 5 MG TABS tablet Take 1 tablet (5 mg total) by mouth 2 (two) times daily. 180 tablet 2  . atorvastatin (LIPITOR) 20 MG tablet Take 1 tablet (  20 mg total) by mouth daily. 90 tablet 2  . bumetanide (BUMEX) 1 MG tablet TAKE ONE (1) TABLET BY MOUTH EVERY DAY 90 tablet 0  . calcium carbonate (OS-CAL) 600 MG TABS tablet Take 600 mg by mouth 2 (two) times daily with a meal.    . diltiazem (CARDIZEM CD) 240 MG 24 hr capsule Take 240 mg by mouth daily.    Marland Kitchen dofetilide (TIKOSYN) 250 MCG capsule Take 1 capsule (250 mcg total) by mouth 2 (two) times daily. 60 capsule 3  . Lactobacillus (PROBIOTIC ACIDOPHILUS) TABS Take 1 tablet by mouth daily. Reported on 03/04/2016    . lisinopril (PRINIVIL,ZESTRIL) 10 MG tablet Take 1 tablet (10 mg total) by mouth daily. 90 tablet 2  . loratadine (CLARITIN) 10 MG tablet Take 10 mg by mouth daily.     . potassium chloride  (KLOR-CON 10) 10 MEQ tablet TAKE ONE (1) TABLET EACH DAY 90 tablet 2  . psyllium (REGULOID) 0.52 g capsule Take 0.52 g by mouth daily.     No current facility-administered medications for this encounter.    Allergies  Allergen Reactions  . Gabapentin Swelling  . Lactose Intolerance (Gi) Other (See Comments)    Bothers her stomach  . Zebeta [Bisoprolol Fumarate] Nausea Only  . Penicillins Rash    Social History   Social History  . Marital Status: Widowed    Spouse Name: N/A  . Number of Children: 3  . Years of Education: N/A   Occupational History  . retired Pharmacist, hospital    Social History Main Topics  . Smoking status: Never Smoker   . Smokeless tobacco: Never Used     Comment: never used tobacco  . Alcohol Use: No  . Drug Use: No  . Sexual Activity: No     Comment: lives [place], low sodium diet   Other Topics Concern  . Not on file   Social History Narrative    Family History  Problem Relation Age of Onset  . Heart attack Mother 36  . Hyperlipidemia Mother     ?  Marland Kitchen Dementia Mother   . Pernicious anemia Mother   . COPD Father     smoker  . Cancer Father 65    prostate  . Heart disease Brother     quadruple bipass surgery  . Hyperlipidemia Brother   . Hypertension Brother   . Diabetes Maternal Grandmother     type 2  . Pernicious anemia Maternal Grandmother   . Colon cancer Neg Hx   . Pancreatic cancer Neg Hx   . Stomach cancer Neg Hx   . Throat cancer Neg Hx   . Liver disease Neg Hx   . Gout Son   . Atrial fibrillation Son   . Hyperlipidemia Son   . Cancer Maternal Grandfather     liver  . Cancer Paternal Grandmother     lung- doesn't think she smokes?  . Stroke Paternal Grandfather   . Cancer Son 50    non hodgin's lymphoma  . Gout Son   . Hyperlipidemia Son   . Sleep apnea Son     ROS- All systems are reviewed and negative except as per the HPI above  Physical Exam: Filed Vitals:   03/04/16 1351  BP: 138/74  Pulse: 75    Height: 5' 3.5" (1.613 m)  Weight: 260 lb 12.8 oz (118.298 kg)    GEN- The patient is well appearing, alert and oriented x 3 today.   Head- normocephalic,  atraumatic Eyes-  Sclera clear, conjunctiva pink Ears- hearing intact Oropharynx- clear Neck- supple, no JVP, soft bruit rt carotid Lymph- no cervical lymphadenopathy Lungs- Clear to ausculation bilaterally, normal work of breathing Heart- Regular rate and rhythm, no murmurs, rubs or gallops, PMI not laterally displaced GI- soft, NT, ND, + BS Extremities- no clubbing, cyanosis, or edema MS- no significant deformity or atrophy Skin- no rash or lesion Psych- euthymic mood, full affect Neuro- strength and sensation are intact  EKG- NSR at 76 bpm, Pr int 172 ms, QRS int 76 ms, Qtc 454 ms(qtc is stable) Epic records reviewed   Assessment and Plan: 1. Afib Doing well on tikosyn, continue 250 mg bid, reports no recent afib Continue daily cardizem with 30 mg as needed for bouts of afib Continue apixaban with chadsvasc score of 5 Bmet/mag pending  2. Lightheadedness Soft bruit rt carotid Carotid u/s ordered   F/u in 4 months  PCP as scheduled  Butch Penny C. Liyana Suniga, [facility] [address] [place], [place] [address] [phone]

## 2018-01-21 ENCOUNTER — Other Ambulatory Visit: Payer: Self-pay | Admitting: Family Medicine

## 2018-02-01 ENCOUNTER — Ambulatory Visit (HOSPITAL_BASED_OUTPATIENT_CLINIC_OR_DEPARTMENT_OTHER)
Admission: RE | Admit: 2018-02-01 | Discharge: 2018-02-01 | Disposition: A | Discharge status: Home / Self Care | Payer: Medicare Other | Source: Ambulatory Visit | Attending: Nurse Practitioner | Admitting: Nurse Practitioner

## 2018-02-01 DIAGNOSIS — R0989 Other specified symptoms and signs involving the circulatory and respiratory systems: Secondary | ICD-10-CM | POA: Insufficient documentation

## 2018-02-01 NOTE — Progress Notes (Signed)
  Complete carotid artery duplex performed. Grossly normal exam. No ICA stenosis identified. Joelene Millin 02/01/2018, 11:14 AM

## 2018-02-21 ENCOUNTER — Encounter: Payer: Self-pay | Admitting: Family

## 2018-02-21 ENCOUNTER — Ambulatory Visit: Payer: Self-pay | Admitting: *Deleted

## 2018-02-21 ENCOUNTER — Ambulatory Visit (INDEPENDENT_AMBULATORY_CARE_PROVIDER_SITE_OTHER): Payer: Medicare Other | Admitting: Family

## 2018-02-21 VITALS — BP 150/79 | HR 80 | Temp 98.6°F | Resp 16 | Ht 63.0 in | Wt 271.0 lb

## 2018-02-21 DIAGNOSIS — H8110 Benign paroxysmal vertigo, unspecified ear: Secondary | ICD-10-CM | POA: Diagnosis not present

## 2018-02-21 MED ORDER — MECLIZINE HCL 25 MG PO TABS: 25.0000 mg | ORAL_TABLET | Freq: Three times a day (TID) | ORAL | 0 refills | Status: DC | PRN

## 2018-02-21 NOTE — Telephone Encounter (Signed)
Pt reports intermittent episodes of vertigo, onset Saturday. States occurred with vomiting this weekend, not this morning and pt states was able to eat and staying hydrated. Reports vertigo worsens with bending over, leaning to left. Reports vertigo with inner ear problems in the past, "Feels like same problem."  Has been doing Epley maneuver, ineffective. Pt has H/O of A-Fib, states she is not in A-Fib per FitBit monitor. Pt states "My heart rate is fine."  Same day appt made with M. Inda Castle for this am. Instructed pt to have someone drive her, go to ED if symptoms worsen. Reason for Disposition . Vomiting occurs with dizziness  Answer Assessment - Initial Assessment Questions 1. DESCRIPTION: "Describe your dizziness."     vertigo 2. VERTIGO: "Do you feel like either you or the room is spinning or tilting?"      spinning 3. LIGHTHEADED: "Do you feel lightheaded?" (e.g., somewhat faint, woozy, weak upon standing)     yes 4. SEVERITY: "How bad is it?"  "Can you walk?"   - MILD - Feels unsteady but walking normally.   - MODERATE - Feels very unsteady when walking, but not falling; interferes with normal activities (e.g., school, work) .   - SEVERE - Unable to walk without falling (requires assistance).     moderate 5. ONSET:  "When did the dizziness begin?"     Saturday 6. AGGRAVATING FACTORS: "Does anything make it worse?" (e.g., standing, change in head position)     Bending over 7. CAUSE: "What do you think is causing the dizziness?"     unsure 8. RECURRENT SYMPTOM: "Have you had dizziness before?" If so, ask: "When was the last time?" "What happened that time?"      9. OTHER SYMPTOMS: "Do you have any other symptoms?" (e.g., headache, weakness, numbness, vomiting, earache)     Nausea, vomiting over weekend.  Protocols used: DIZZINESS - VERTIGO-A-AH

## 2018-02-21 NOTE — Patient Instructions (Addendum)
Please begin meclizine as needed for dizziness. You should be contacted about schedule your appointment with the therapist for vestibular rehab.   Vertigo Vertigo means that you feel like you are moving when you are not. Vertigo can also make you feel like things around you are moving when they are not. This feeling can come and go at any time. Vertigo often goes away on its own. Follow these instructions at home:  Avoid making fast movements.  Avoid driving.  Avoid using heavy machinery.  Avoid doing any task or activity that might cause danger to you or other people if you would have a vertigo attack while you are doing it.  Sit down right away if you feel dizzy or have trouble with your balance.  Take over-the-counter and prescription medicines only as told by your doctor.  Follow instructions from your doctor about which positions or movements you should avoid.  Drink enough fluid to keep your pee (urine) clear or pale yellow.  Keep all follow-up visits as told by your doctor. This is important. Contact a doctor if:  Medicine does not help your vertigo.  You have a fever.  Your problems get worse or you have new symptoms.  Your family or friends see changes in your behavior.  You feel sick to your stomach (nauseous) or you throw up (vomit).  You have a "pins and needles" feeling or you are numb in part of your body. Get help right away if:  You have trouble moving or talking.  You are always dizzy.  You pass out (faint).  You get very bad headaches.  You feel weak or have trouble using your hands, arms, or legs.  You have changes in your hearing.  You have changes in your seeing (vision).  You get a stiff neck.  Bright light starts to bother you. This information is not intended to replace advice given to you by your health care provider. Make sure you discuss any questions you have with your health care provider. Document Released: 05/26/2008 Document  Revised: 01/23/2016 Document Reviewed: 12/10/2014 Elsevier Interactive Patient Education  [employer].

## 2018-02-21 NOTE — Progress Notes (Signed)
Subjective:    Patient ID: Tracy Miles, female    DOB: 07-Jan-1934, 84 y.o.   MRN: [id number]  HPI  Tracy Miles is an 84 yr old female who presents today with chief complaint of dizziness.  Reports that her dizziness started on Saturday AM. Reports that her symptoms are worst when laying down or bending over.   Reports previous history of similar symptoms in 2013.Saw ENT at that time.  Has had nausea/vomitting- last episode of vomiting was yesterday AM.  Denies numbness, weakness or slurred speech.     Review of Systems See HPI  Past Medical History:  Diagnosis Date  . Anemia   . Aortic stenosis    Very mild  . Benign paroxysmal positional vertigo 12/17/2013  . Chicken pox as a child  . Colon polyp   . Coronary artery disease   . DDD (degenerative disc disease) 11/16/2013  . Hyperlipidemia   . Hypertension   . Iron deficiency anemia 11/13/2013  . Mumps child and teenager  . OA (osteoarthritis) 11/19/2013   S/p L TKR  . Obesity   . OSA on CPAP   . Pancreatitis    post hysterectomy  . Persistent atrial fibrillation ([place])   . Personal history of colonic polyps 02/25/2014  . Personal history of DVT (deep vein thrombosis) X 2   "left"  . Sleep apnea   . Spinal stenosis      Social History   Socioeconomic History  . Marital status: Widowed    Spouse name: Not on file  . Number of children: 3  . Years of education: Not on file  . Highest education level: Not on file  Occupational History  . Occupation: retired Tour manager  . Financial resource strain: Not on file  . Food insecurity:    Worry: Not on file    Inability: Not on file  . Transportation needs:    Medical: Not on file    Non-medical: Not on file  Tobacco Use  . Smoking status: Never Smoker  . Smokeless tobacco: Never Used  . Tobacco comment: never used tobacco  Substance and Sexual Activity  . Alcohol use: No  . Drug use: No  . Sexual activity: Never    Comment: lives at [place],  low sodium diet  Lifestyle  . Physical activity:    Days per week: Not on file    Minutes per session: Not on file  . Stress: Not on file  Relationships  . Social connections:    Talks on phone: Not on file    Gets together: Not on file    Attends religious service: Not on file    Active member of club or organization: Not on file    Attends meetings of clubs or organizations: Not on file    Relationship status: Not on file  . Intimate partner violence:    Fear of current or ex partner: Not on file    Emotionally abused: Not on file    Physically abused: Not on file    Forced sexual activity: Not on file  Other Topics Concern  . Not on file  Social History Narrative  . Not on file    Past Surgical History:  Procedure Laterality Date  . APPENDECTOMY    . CARDIOVERSION N/A 08/09/2015   Procedure: CARDIOVERSION;  Surgeon: Jerline Pain, MD;  Location: [facility];  Service: Cardiovascular;  Laterality: N/A;  . CATARACT EXTRACTION W/ INTRAOCULAR LENS  IMPLANT, BILATERAL Bilateral  2016  . CYST REMOVAL HAND Bilateral 1990's   "played to much golf"  . DILATION AND CURETTAGE OF UTERUS    . LUMBAR LAMINECTOMY  40 yrs ago   "L3-4"  . MENISCUS REPAIR Bilateral 2000 and 2010  . REPLACEMENT TOTAL KNEE Left 2013  . TONSILECTOMY, ADENOIDECTOMY, BILATERAL MYRINGOTOMY AND TUBES  child  . TOTAL ABDOMINAL HYSTERECTOMY  1995   had 2 tumors- benign  . TUBAL LIGATION  84 years old    Family History  Problem Relation Age of Onset  . Heart attack Mother 47  . Hyperlipidemia Mother        ?  Marland Kitchen Dementia Mother   . Pernicious anemia Mother   . COPD Father        smoker  . Cancer Father 38       prostate  . Heart disease Brother        quadruple bipass surgery  . Hyperlipidemia Brother   . Hypertension Brother   . Diabetes Maternal Grandmother        type 2  . Pernicious anemia Maternal Grandmother   . Gout Son   . Atrial fibrillation Son   . Hyperlipidemia Son   . Cancer  Maternal Grandfather        liver  . Cancer Paternal Grandmother        lung- doesn't think she smokes?  . Stroke Paternal Grandfather   . Cancer Son 50       non hodgin's lymphoma  . Gout Son   . Hyperlipidemia Son   . Sleep apnea Son   . Colon cancer Neg Hx   . Pancreatic cancer Neg Hx   . Stomach cancer Neg Hx   . Throat cancer Neg Hx   . Liver disease Neg Hx       Current Outpatient Medications on File Prior to Visit  Medication Sig Dispense Refill  . acetaminophen (TYLENOL) 325 MG tablet Take 325 mg by mouth daily.     Marland Kitchen apixaban (ELIQUIS) 5 MG TABS tablet Take 1 tablet (5 mg total) by mouth 2 (two) times daily. 90 tablet 1  . atorvastatin (LIPITOR) 20 MG tablet Take 1 tablet (20 mg total) by mouth daily at 6 PM. 90 tablet 2  . bumetanide (BUMEX) 1 MG tablet TAKE ONE (1) TABLET BY MOUTH EACH DAY 90 tablet 2  . bumetanide (BUMEX) 1 MG tablet TAKE ONE (1) TABLET BY MOUTH EACH DAY 90 tablet 0  . calcium carbonate (OS-CAL) 600 MG TABS tablet Take 600 mg by mouth 2 (two) times daily with a meal.    . diltiazem (CARDIZEM CD) 120 MG 24 hr capsule Take 1 capsule (120 mg total) by mouth daily. 30 capsule 6  . diltiazem (CARDIZEM) 30 MG tablet Take 1 tablet every 4 hours AS NEEDED for heart rate >100 as long as blood pressure >100. 45 tablet 3  . dofetilide (TIKOSYN) 250 MCG capsule Take 1 capsule (250 mcg total) by mouth 2 (two) times daily. 60 capsule 6  . lactobacillus acidophilus (BACID) TABS tablet Take 1 tablet by mouth daily.     Marland Kitchen lisinopril (PRINIVIL,ZESTRIL) 10 MG tablet Take 1 tablet (10 mg total) by mouth daily. 90 tablet 1  . loratadine (CLARITIN) 10 MG tablet Take 10 mg by mouth daily.     . potassium chloride (KLOR-CON 10) 10 MEQ tablet Take one tablet in the morning and two tablets in the pm 270 tablet 2  . psyllium (REGULOID) 0.52 g capsule  Take 0.52 g by mouth 2 (two) times daily.      No current facility-administered medications on file prior to visit.     BP (!)  150/79 (BP Location: Left Arm, Patient Position: Sitting, Cuff Size: Large)   Pulse 80   Temp 98.6 F (37 C) (Oral)   Resp 16   Ht 5\' 3"  (1.6 m)   Wt 271 lb (122.9 kg)   SpO2 97%   BMI 48.01 kg/m       Objective:   Physical Exam  Constitutional: She is oriented to person, place, and time. She appears well-developed and well-nourished.  Eyes: Pupils are equal, round, and reactive to light.  Cardiovascular: Normal rate, regular rhythm and normal heart sounds.  No murmur heard. Pulmonary/Chest: Effort normal and breath sounds normal. No respiratory distress. She has no wheezes.  Neurological: She is alert and oriented to person, place, and time.  EOM intact Bilateral UE/LE strength is 5/5 Had extreme dizziness with attempt at Dix-Hallpike test  Skin: Skin is warm and dry.  Psychiatric: She has a normal mood and affect. Her behavior is normal. Judgment and thought content normal.          Assessment & Plan:  BPV-  Discussed standing carefully/slowly and avoiding driving if dizzy.  Add trial of meclizine and refer for vestibular rehab. Discussed signs/symptoms of stroke and she is advised to call 911 if this occurs. Follow up with PCP in 6 weeks.

## 2018-02-25 ENCOUNTER — Telehealth: Payer: Self-pay | Admitting: Family

## 2018-02-25 NOTE — Telephone Encounter (Signed)
Please advise on referral

## 2018-02-25 NOTE — Telephone Encounter (Signed)
Copied from [place] [id number]. Topic: General - Other >> Feb 25, 2018  1:19 PM Marin Olp L wrote: Reason for CRM: Patient still has not heard back regarding vestibular rehab order that was placed on 06/24 for vertigo. The dizziness is still coming and going. She would like a call back asap.

## 2018-03-01 NOTE — Telephone Encounter (Signed)
Order was sent to [facility] again, they will contact pt today.

## 2018-03-09 DIAGNOSIS — R262 Difficulty in walking, not elsewhere classified: Secondary | ICD-10-CM | POA: Diagnosis not present

## 2018-03-09 DIAGNOSIS — R42 Dizziness and giddiness: Secondary | ICD-10-CM | POA: Diagnosis not present

## 2018-03-09 DIAGNOSIS — H8110 Benign paroxysmal vertigo, unspecified ear: Secondary | ICD-10-CM | POA: Diagnosis not present

## 2018-03-09 DIAGNOSIS — H81391 Other peripheral vertigo, right ear: Secondary | ICD-10-CM | POA: Diagnosis not present

## 2018-03-14 ENCOUNTER — Telehealth: Payer: Self-pay | Admitting: *Deleted

## 2018-03-14 DIAGNOSIS — R262 Difficulty in walking, not elsewhere classified: Secondary | ICD-10-CM | POA: Diagnosis not present

## 2018-03-14 DIAGNOSIS — R42 Dizziness and giddiness: Secondary | ICD-10-CM | POA: Diagnosis not present

## 2018-03-14 DIAGNOSIS — H81391 Other peripheral vertigo, right ear: Secondary | ICD-10-CM | POA: Diagnosis not present

## 2018-03-14 DIAGNOSIS — H8110 Benign paroxysmal vertigo, unspecified ear: Secondary | ICD-10-CM | POA: Diagnosis not present

## 2018-03-14 NOTE — Telephone Encounter (Signed)
Received Physician Orders from [facility]; forwarded to provider/SLS 07/15

## 2018-03-16 DIAGNOSIS — R262 Difficulty in walking, not elsewhere classified: Secondary | ICD-10-CM | POA: Diagnosis not present

## 2018-03-16 DIAGNOSIS — H81391 Other peripheral vertigo, right ear: Secondary | ICD-10-CM | POA: Diagnosis not present

## 2018-03-16 DIAGNOSIS — H8110 Benign paroxysmal vertigo, unspecified ear: Secondary | ICD-10-CM | POA: Diagnosis not present

## 2018-03-16 DIAGNOSIS — R42 Dizziness and giddiness: Secondary | ICD-10-CM | POA: Diagnosis not present

## 2018-03-21 DIAGNOSIS — H81391 Other peripheral vertigo, right ear: Secondary | ICD-10-CM | POA: Diagnosis not present

## 2018-03-21 DIAGNOSIS — R42 Dizziness and giddiness: Secondary | ICD-10-CM | POA: Diagnosis not present

## 2018-03-21 DIAGNOSIS — R262 Difficulty in walking, not elsewhere classified: Secondary | ICD-10-CM | POA: Diagnosis not present

## 2018-03-21 DIAGNOSIS — H8110 Benign paroxysmal vertigo, unspecified ear: Secondary | ICD-10-CM | POA: Diagnosis not present

## 2018-03-23 DIAGNOSIS — R262 Difficulty in walking, not elsewhere classified: Secondary | ICD-10-CM | POA: Diagnosis not present

## 2018-03-23 DIAGNOSIS — R42 Dizziness and giddiness: Secondary | ICD-10-CM | POA: Diagnosis not present

## 2018-03-23 DIAGNOSIS — H81391 Other peripheral vertigo, right ear: Secondary | ICD-10-CM | POA: Diagnosis not present

## 2018-03-23 DIAGNOSIS — H8110 Benign paroxysmal vertigo, unspecified ear: Secondary | ICD-10-CM | POA: Diagnosis not present

## 2018-03-28 DIAGNOSIS — R262 Difficulty in walking, not elsewhere classified: Secondary | ICD-10-CM | POA: Diagnosis not present

## 2018-03-28 DIAGNOSIS — H8110 Benign paroxysmal vertigo, unspecified ear: Secondary | ICD-10-CM | POA: Diagnosis not present

## 2018-03-28 DIAGNOSIS — H81391 Other peripheral vertigo, right ear: Secondary | ICD-10-CM | POA: Diagnosis not present

## 2018-03-28 DIAGNOSIS — R42 Dizziness and giddiness: Secondary | ICD-10-CM | POA: Diagnosis not present

## 2018-03-30 DIAGNOSIS — R262 Difficulty in walking, not elsewhere classified: Secondary | ICD-10-CM | POA: Diagnosis not present

## 2018-03-30 DIAGNOSIS — H8110 Benign paroxysmal vertigo, unspecified ear: Secondary | ICD-10-CM | POA: Diagnosis not present

## 2018-03-30 DIAGNOSIS — R42 Dizziness and giddiness: Secondary | ICD-10-CM | POA: Diagnosis not present

## 2018-03-30 DIAGNOSIS — H81391 Other peripheral vertigo, right ear: Secondary | ICD-10-CM | POA: Diagnosis not present

## 2018-03-31 ENCOUNTER — Other Ambulatory Visit (HOSPITAL_COMMUNITY): Payer: Self-pay | Admitting: *Deleted

## 2018-03-31 MED ORDER — DILTIAZEM HCL ER COATED BEADS 120 MG PO CP24: 120.0000 mg | ORAL_CAPSULE | Freq: Every day | ORAL | 6 refills | Status: DC

## 2018-04-01 ENCOUNTER — Other Ambulatory Visit: Payer: Self-pay

## 2018-04-04 DIAGNOSIS — R42 Dizziness and giddiness: Secondary | ICD-10-CM | POA: Diagnosis not present

## 2018-04-04 DIAGNOSIS — H81391 Other peripheral vertigo, right ear: Secondary | ICD-10-CM | POA: Diagnosis not present

## 2018-04-04 DIAGNOSIS — H8110 Benign paroxysmal vertigo, unspecified ear: Secondary | ICD-10-CM | POA: Diagnosis not present

## 2018-04-04 DIAGNOSIS — R262 Difficulty in walking, not elsewhere classified: Secondary | ICD-10-CM | POA: Diagnosis not present

## 2018-04-05 ENCOUNTER — Ambulatory Visit (INDEPENDENT_AMBULATORY_CARE_PROVIDER_SITE_OTHER): Payer: Medicare Other | Admitting: Family Medicine

## 2018-04-05 ENCOUNTER — Ambulatory Visit (HOSPITAL_BASED_OUTPATIENT_CLINIC_OR_DEPARTMENT_OTHER)
Admission: RE | Admit: 2018-04-05 | Discharge: 2018-04-05 | Disposition: A | Discharge status: Home / Self Care | Payer: Medicare Other | Source: Ambulatory Visit | Attending: Family Medicine | Admitting: Family Medicine

## 2018-04-05 ENCOUNTER — Encounter: Payer: Self-pay | Admitting: Family Medicine

## 2018-04-05 VITALS — BP 122/76 | HR 79 | Temp 98.1°F | Resp 18 | Ht 63.0 in | Wt 270.0 lb

## 2018-04-05 DIAGNOSIS — I10 Essential (primary) hypertension: Secondary | ICD-10-CM | POA: Diagnosis not present

## 2018-04-05 DIAGNOSIS — L0291 Cutaneous abscess, unspecified: Secondary | ICD-10-CM | POA: Diagnosis not present

## 2018-04-05 DIAGNOSIS — M542 Cervicalgia: Secondary | ICD-10-CM | POA: Insufficient documentation

## 2018-04-05 DIAGNOSIS — R739 Hyperglycemia, unspecified: Secondary | ICD-10-CM | POA: Diagnosis not present

## 2018-04-05 DIAGNOSIS — H8110 Benign paroxysmal vertigo, unspecified ear: Secondary | ICD-10-CM

## 2018-04-05 DIAGNOSIS — M4802 Spinal stenosis, cervical region: Secondary | ICD-10-CM | POA: Diagnosis not present

## 2018-04-05 DIAGNOSIS — M5090 Cervical disc disorder, unspecified, unspecified cervical region: Secondary | ICD-10-CM

## 2018-04-05 DIAGNOSIS — E6609 Other obesity due to excess calories: Secondary | ICD-10-CM

## 2018-04-05 DIAGNOSIS — E782 Mixed hyperlipidemia: Secondary | ICD-10-CM | POA: Diagnosis not present

## 2018-04-05 MED ORDER — VALACYCLOVIR HCL 1 G PO TABS: 1000.0000 mg | ORAL_TABLET | Freq: Three times a day (TID) | ORAL | 0 refills | Status: DC

## 2018-04-05 MED ORDER — APIXABAN 5 MG PO TABS: 5.0000 mg | ORAL_TABLET | Freq: Two times a day (BID) | ORAL | 1 refills | Status: DC

## 2018-04-05 MED ORDER — DOXYCYCLINE HYCLATE 100 MG PO TABS: 100.0000 mg | ORAL_TABLET | Freq: Two times a day (BID) | ORAL | 0 refills | Status: DC

## 2018-04-05 NOTE — Assessment & Plan Note (Signed)
Well controlled, no changes to meds. Encouraged heart healthy diet such as the DASH diet and exercise as tolerated.  °

## 2018-04-05 NOTE — Assessment & Plan Note (Signed)
Encouraged DASH diet, decrease po intake and increase exercise as tolerated. Needs 7-8 hours of sleep nightly. Avoid trans fats, eat small, frequent meals every 4-5 hours with lean proteins, complex carbs and healthy fats. Minimize simple carbs 

## 2018-04-05 NOTE — Progress Notes (Signed)
Subjective:  I acted as a Education administrator for Dr. Charlett Blake. Tracy Miles, [place]  Patient ID: Tracy Miles, female    DOB: April 05, 1934, 84 y.o.   MRN: [id number]  No chief complaint on file.   HPI  Patient is in today for a 6 week follow up.  She is struggling with worsening pain at the base of her neck.  She notes it is been more swollen and tender for about 6 days now.  Tylenol does help some.  She has been noting some mild neuropathy into her fingers with some tingling since the swelling worsened.  No fevers or chills.  Does endorse some fatigue but no worsening myalgias.  She was struggling with vertigo but after a course of vestibular rehab at [facility] that has resolved. No polyuria or polydipsia. Denies CP/palp/SOB/HA/congestion/fevers/GI or GU c/o. Taking meds as prescribed  Patient Care Team: [name], MD as PCP - General (Family Medicine) Clayborne Artist, NP as Nurse Practitioner (Cardiology) Rigoberto Noel, MD as Consulting Physician (Pulmonary Disease) Melrose Nakayama, MD as Consulting Physician (Orthopedic Surgery)   Past Medical History:  Diagnosis Date  . Anemia   . Aortic stenosis    Very mild  . Benign paroxysmal positional vertigo 12/17/2013  . Chicken pox as a child  . Colon polyp   . Coronary artery disease   . DDD (degenerative disc disease) 11/16/2013  . Hyperlipidemia   . Hypertension   . Iron deficiency anemia 11/13/2013  . Mumps child and teenager  . OA (osteoarthritis) 11/19/2013   S/p L TKR  . Obesity   . OSA on CPAP   . Pancreatitis    post hysterectomy  . Persistent atrial fibrillation ([place])   . Personal history of colonic polyps 02/25/2014  . Personal history of DVT (deep vein thrombosis) X 2   "left"  . Sleep apnea   . Spinal stenosis     Past Surgical History:  Procedure Laterality Date  . APPENDECTOMY    . CARDIOVERSION N/A 08/09/2015   Procedure: CARDIOVERSION;  Surgeon: Jerline Pain, MD;  Location: [place] [facility];  Service:  Cardiovascular;  Laterality: N/A;  . CATARACT EXTRACTION W/ INTRAOCULAR LENS  IMPLANT, BILATERAL Bilateral 2016  . CYST REMOVAL HAND Bilateral 1990's   "played to much golf"  . DILATION AND CURETTAGE OF UTERUS    . LUMBAR LAMINECTOMY  40 yrs ago   "L3-4"  . MENISCUS REPAIR Bilateral 2000 and 2010  . REPLACEMENT TOTAL KNEE Left 2013  . TONSILECTOMY, ADENOIDECTOMY, BILATERAL MYRINGOTOMY AND TUBES  child  . TOTAL ABDOMINAL HYSTERECTOMY  1995   had 2 tumors- benign  . TUBAL LIGATION  84 years old    Family History  Problem Relation Age of Onset  . Heart attack Mother 10  . Hyperlipidemia Mother        ?  Marland Kitchen Dementia Mother   . Pernicious anemia Mother   . COPD Father        smoker  . Cancer Father 61       prostate  . Heart disease Brother        quadruple bipass surgery  . Hyperlipidemia Brother   . Hypertension Brother   . Diabetes Maternal Grandmother        type 2  . Pernicious anemia Maternal Grandmother   . Gout Son   . Atrial fibrillation Son   . Hyperlipidemia Son   . Cancer Maternal Grandfather        liver  .  Cancer Paternal Grandmother        lung- doesn't think she smokes?  . Stroke Paternal Grandfather   . Cancer Son 50       non hodgin's lymphoma  . Gout Son   . Hyperlipidemia Son   . Sleep apnea Son   . Colon cancer Neg Hx   . Pancreatic cancer Neg Hx   . Stomach cancer Neg Hx   . Throat cancer Neg Hx   . Liver disease Neg Hx     Social History   Socioeconomic History  . Marital status: Widowed    Spouse name: Not on file  . Number of children: 3  . Years of education: Not on file  . Highest education level: Not on file  Occupational History  . Occupation: retired Tour manager  . Financial resource strain: Not on file  . Food insecurity:    Worry: Not on file    Inability: Not on file  . Transportation needs:    Medical: Not on file    Non-medical: Not on file  Tobacco Use  . Smoking status: Never Smoker  . Smokeless tobacco:  Never Used  . Tobacco comment: never used tobacco  Substance and Sexual Activity  . Alcohol use: No  . Drug use: No  . Sexual activity: Never    Comment: lives at [place], low sodium diet  Lifestyle  . Physical activity:    Days per week: Not on file    Minutes per session: Not on file  . Stress: Not on file  Relationships  . Social connections:    Talks on phone: Not on file    Gets together: Not on file    Attends religious service: Not on file    Active member of club or organization: Not on file    Attends meetings of clubs or organizations: Not on file    Relationship status: Not on file  . Intimate partner violence:    Fear of current or ex partner: Not on file    Emotionally abused: Not on file    Physically abused: Not on file    Forced sexual activity: Not on file  Other Topics Concern  . Not on file  Social History Narrative  . Not on file    Outpatient Medications Prior to Visit  Medication Sig Dispense Refill  . acetaminophen (TYLENOL) 325 MG tablet Take 325 mg by mouth daily.     Marland Kitchen atorvastatin (LIPITOR) 20 MG tablet Take 1 tablet (20 mg total) by mouth daily at 6 PM. 90 tablet 2  . bumetanide (BUMEX) 1 MG tablet TAKE ONE (1) TABLET BY MOUTH EACH DAY 90 tablet 0  . calcium carbonate (OS-CAL) 600 MG TABS tablet Take 600 mg by mouth 2 (two) times daily with a meal.    . diltiazem (CARDIZEM CD) 120 MG 24 hr capsule Take 1 capsule (120 mg total) by mouth daily. 30 capsule 6  . diltiazem (CARDIZEM) 30 MG tablet Take 1 tablet every 4 hours AS NEEDED for heart rate >100 as long as blood pressure >100. 45 tablet 3  . dofetilide (TIKOSYN) 250 MCG capsule Take 1 capsule (250 mcg total) by mouth 2 (two) times daily. 60 capsule 6  . lactobacillus acidophilus (BACID) TABS tablet Take 1 tablet by mouth daily.     Marland Kitchen lisinopril (PRINIVIL,ZESTRIL) 10 MG tablet Take 1 tablet (10 mg total) by mouth daily. 90 tablet 1  . loratadine (CLARITIN) 10 MG tablet Take  10 mg by mouth  daily.     . meclizine (ANTIVERT) 25 MG tablet Take 1 tablet (25 mg total) by mouth 3 (three) times daily as needed for dizziness. 30 tablet 0  . potassium chloride (KLOR-CON 10) 10 MEQ tablet Take one tablet in the morning and two tablets in the pm 270 tablet 2  . psyllium (REGULOID) 0.52 g capsule Take 0.52 g by mouth 2 (two) times daily.     Marland Kitchen apixaban (ELIQUIS) 5 MG TABS tablet Take 1 tablet (5 mg total) by mouth 2 (two) times daily. 90 tablet 1   No facility-administered medications prior to visit.      Review of Systems  Constitutional: Positive for malaise/fatigue. Negative for fever.  HENT: Negative for congestion.   Eyes: Negative for blurred vision.  Respiratory: Negative for shortness of breath.   Cardiovascular: Negative for chest pain, palpitations and leg swelling.  Gastrointestinal: Negative for abdominal pain, blood in stool and nausea.  Genitourinary: Negative for dysuria and frequency.  Musculoskeletal: Positive for neck pain. Negative for falls.  Skin: Positive for rash.  Neurological: Positive for dizziness. Negative for loss of consciousness and headaches.  Endo/Heme/Allergies: Negative for environmental allergies.  Psychiatric/Behavioral: Negative for depression. The patient is not nervous/anxious.        Objective:    Physical Exam  Constitutional: She is oriented to person, place, and time. She appears well-developed and well-nourished. No distress.  HENT:  Head: Normocephalic and atraumatic.  Nose: Nose normal.  Eyes: Right eye exhibits no discharge. Left eye exhibits no discharge.  Neck: Normal range of motion. Neck supple.  Cardiovascular: Normal rate and regular rhythm.  No murmur heard. Pulmonary/Chest: Effort normal and breath sounds normal.  Abdominal: Soft. Bowel sounds are normal. There is no tenderness.  Musculoskeletal: She exhibits edema.  Neurological: She is alert and oriented to person, place, and time.  Skin: Skin is warm and dry. There  is erythema.  Raised firm lesion under skin at base of cervical spine, erythema noted no discharge.  Psychiatric: She has a normal mood and affect.  Nursing note and vitals reviewed.   BP 122/76 (BP Location: Left Arm, Patient Position: Sitting, Cuff Size: Large)   Pulse 79   Temp 98.1 F (36.7 C) (Oral)   Resp 18   Ht 5\' 3"  (1.6 m)   Wt 270 lb (122.5 kg)   SpO2 97%   BMI 47.83 kg/m  Wt Readings from Last 3 Encounters:  04/05/18 270 lb (122.5 kg)  02/21/18 271 lb (122.9 kg)  01/12/18 272 lb (123.4 kg)   BP Readings from Last 3 Encounters:  04/05/18 122/76  02/21/18 (!) 150/79  01/12/18 128/74     Immunization History  Administered Date(s) Administered  . Influenza, High Dose Seasonal PF 05/11/2017  . Influenza,inj,Quad PF,6+ Mos 04/30/2016  . Influenza-Unspecified 05/31/2013, 05/01/2014, 06/11/2015  . Pneumococcal Conjugate-13 04/02/2014  . Pneumococcal Polysaccharide-23 08/31/1998, 09/06/2015  . Tdap 07/31/2012    Health Maintenance  Topic Date Due  . INFLUENZA VACCINE  03/31/2018  . COLONOSCOPY  11/22/2018  . TETANUS/TDAP  07/31/2022  . DEXA SCAN  Completed  . PNA vac Low Risk Adult  Completed    Lab Results  Component Value Date   WBC 7.3 04/05/2018   HGB 13.0 04/05/2018   HCT 37.8 04/05/2018   PLT 263.0 04/05/2018   GLUCOSE 104 (H) 04/05/2018   CHOL 152 04/05/2018   TRIG 151.0 (H) 04/05/2018   HDL 45.30 04/05/2018   LDLCALC 77 04/05/2018  ALT 9 04/05/2018   AST 13 04/05/2018   NA 139 04/05/2018   K 4.2 04/05/2018   CL 100 04/05/2018   CREATININE 1.01 04/05/2018   BUN 15 04/05/2018   CO2 29 04/05/2018   TSH 2.06 04/05/2018   INR 1.55 (H) 08/06/2015   HGBA1C 5.9 04/05/2018    Lab Results  Component Value Date   TSH 2.06 04/05/2018   Lab Results  Component Value Date   WBC 7.3 04/05/2018   HGB 13.0 04/05/2018   HCT 37.8 04/05/2018   MCV 93.7 04/05/2018   PLT 263.0 04/05/2018   Lab Results  Component Value Date   NA 139 04/05/2018    K 4.2 04/05/2018   CO2 29 04/05/2018   GLUCOSE 104 (H) 04/05/2018   BUN 15 04/05/2018   CREATININE 1.01 04/05/2018   BILITOT 0.7 04/05/2018   ALKPHOS 83 04/05/2018   AST 13 04/05/2018   ALT 9 04/05/2018   PROT 6.9 04/05/2018   ALBUMIN 4.4 04/05/2018   CALCIUM 9.9 04/05/2018   ANIONGAP 9 01/12/2018   GFR 55.77 (L) 04/05/2018   Lab Results  Component Value Date   CHOL 152 04/05/2018   Lab Results  Component Value Date   HDL 45.30 04/05/2018   Lab Results  Component Value Date   LDLCALC 77 04/05/2018   Lab Results  Component Value Date   TRIG 151.0 (H) 04/05/2018   Lab Results  Component Value Date   CHOLHDL 3 04/05/2018   Lab Results  Component Value Date   HGBA1C 5.9 04/05/2018         Assessment & Plan:   Problem List Items Addressed This Visit    Essential hypertension    Well controlled, no changes to meds. Encouraged heart healthy diet such as the DASH diet and exercise as tolerated.       Relevant Medications   apixaban (ELIQUIS) 5 MG TABS tablet   Other Relevant Orders   CBC (Completed)   Comprehensive metabolic panel (Completed)   TSH (Completed)   Hyperlipidemia, mixed    Encouraged heart healthy diet, increase exercise, avoid trans fats, consider a krill oil cap daily      Relevant Medications   apixaban (ELIQUIS) 5 MG TABS tablet   Other Relevant Orders   Lipid panel (Completed)   Obesity    Encouraged DASH diet, decrease po intake and increase exercise as tolerated. Needs 7-8 hours of sleep nightly. Avoid trans fats, eat small, frequent meals every 4-5 hours with lean proteins, complex carbs and healthy fats. Minimize simple carbs      Cervical disc disease    Confirmed on xray but no acute disc disease noted.       Benign paroxysmal positional vertigo    Improved afte vestibula rehab at [facility] but did have a recent flare. Reminded to hydrate well.       Hyperglycemia     minimize simple carbs. Increase exercise as  tolerated. Continue current meds      Relevant Orders   Hemoglobin A1c (Completed)   Abscess    In skin at base of cervical spine confirmed by CT scan. Stated on antibiotics and referred to general surgery for further evaluation and excision. Seek care if worsens.        Other Visit Diagnoses    Neck pain    -  Primary   Relevant Orders   DG Cervical Spine Complete (Completed)   Sedimentation rate (Completed)      I am  having [address] "Tracy Miles" start on doxycycline and valACYclovir. I am also having her maintain her calcium carbonate, loratadine, psyllium, acetaminophen, diltiazem, lactobacillus acidophilus, dofetilide, potassium chloride, atorvastatin, lisinopril, bumetanide, meclizine, diltiazem, and apixaban.  Meds ordered this encounter  Medications  . apixaban (ELIQUIS) 5 MG TABS tablet    Sig: Take 1 tablet (5 mg total) by mouth 2 (two) times daily.    Dispense:  90 tablet    Refill:  1  . doxycycline (VIBRA-TABS) 100 MG tablet    Sig: Take 1 tablet (100 mg total) by mouth 2 (two) times daily.    Dispense:  20 tablet    Refill:  0  . valACYclovir (VALTREX) 1000 MG tablet    Sig: Take 1 tablet (1,000 mg total) by mouth 3 (three) times daily.    Dispense:  21 tablet    Refill:  0    CMA served as scribe during this visit. History, Physical and Plan performed by medical provider. Documentation and orders reviewed and attested to.  [name], MD

## 2018-04-05 NOTE — Assessment & Plan Note (Signed)
minimize simple carbs. Increase exercise as tolerated. Continue current meds  

## 2018-04-05 NOTE — Patient Instructions (Signed)
Shingles Shingles, which is also known as herpes zoster, is an infection that causes a painful skin rash and fluid-filled blisters. Shingles is not related to genital herpes, which is a sexually transmitted infection. Shingles only develops in people who:  Have had chickenpox.  Have received the chickenpox vaccine. (This is rare.)  What are the causes? Shingles is caused by varicella-zoster virus (VZV). This is the same virus that causes chickenpox. After exposure to VZV, the virus stays in the body in an inactive (dormant) state. Shingles develops if the virus reactivates. This can happen many years after the initial exposure to VZV. It is not known what causes this virus to reactivate. What increases the risk? People who have had chickenpox or received the chickenpox vaccine are at risk for shingles. Infection is more common in people who:  Are older than age 50.  Have a weakened defense (immune) system, such as those with HIV, AIDS, or cancer.  Are taking medicines that weaken the immune system, such as transplant medicines.  Are under great stress.  What are the signs or symptoms? Early symptoms of this condition include itching, tingling, and pain in an area on your skin. Pain may be described as burning, stabbing, or throbbing. A few days or weeks after symptoms start, a painful red rash appears, usually on one side of the body in a bandlike or beltlike pattern. The rash eventually turns into fluid-filled blisters that break open, scab over, and dry up in about 2-3 weeks. At any time during the infection, you may also develop:  A fever.  Chills.  A headache.  An upset stomach.  How is this diagnosed? This condition is diagnosed with a skin exam. Sometimes, skin or fluid samples are taken from the blisters before a diagnosis is made. These samples are examined under a microscope or sent to a lab for testing. How is this treated? There is no specific cure for this condition.  Your health care provider will probably prescribe medicines to help you manage pain, recover more quickly, and avoid long-term problems. Medicines may include:  Antiviral drugs.  Anti-inflammatory drugs.  Pain medicines.  If the area involved is on your face, you may be referred to a specialist, such as an eye doctor (ophthalmologist) or an ear, nose, and throat (ENT) doctor to help you avoid eye problems, chronic pain, or disability. Follow these instructions at home: Medicines  Take medicines only as directed by your health care provider.  Apply an anti-itch or numbing cream to the affected area as directed by your health care provider. Blister and Rash Care  Take a cool bath or apply cool compresses to the area of the rash or blisters as directed by your health care provider. This may help with pain and itching.  Keep your rash covered with a loose bandage (dressing). Wear loose-fitting clothing to help ease the pain of material rubbing against the rash.  Keep your rash and blisters clean with mild soap and cool water or as directed by your health care provider.  Check your rash every day for signs of infection. These include redness, swelling, and pain that lasts or increases.  Do not pick your blisters.  Do not scratch your rash. General instructions  Rest as directed by your health care provider.  Keep all follow-up visits as directed by your health care provider. This is important.  Until your blisters scab over, your infection can cause chickenpox in people who have never had it or been vaccinated   against it. To prevent this from happening, avoid contact with other people, especially: ? Babies. ? Pregnant women. ? Children who have eczema. ? Elderly people who have transplants. ? People who have chronic illnesses, such as leukemia or AIDS. Contact a health care provider if:  Your pain is not relieved with prescribed medicines.  Your pain does not get better after  the rash heals.  Your rash looks infected. Signs of infection include redness, swelling, and pain that lasts or increases. Get help right away if:  The rash is on your face or nose.  You have facial pain, pain around your eye area, or loss of feeling on one side of your face.  You have ear pain or you have ringing in your ear.  You have loss of taste.  Your condition gets worse. This information is not intended to replace advice given to you by your health care provider. Make sure you discuss any questions you have with your health care provider. Document Released: 08/17/2005 Document Revised: 04/12/2016 Document Reviewed: 06/28/2014 Elsevier Interactive Patient Education  2018 [employer]  

## 2018-04-05 NOTE — Assessment & Plan Note (Signed)
Encouraged heart healthy diet, increase exercise, avoid trans fats, consider a krill oil cap daily 

## 2018-04-06 ENCOUNTER — Other Ambulatory Visit: Payer: Self-pay | Admitting: Family Medicine

## 2018-04-06 DIAGNOSIS — M542 Cervicalgia: Secondary | ICD-10-CM

## 2018-04-06 LAB — COMPREHENSIVE METABOLIC PANEL
ALBUMIN: 4.4 g/dL (ref 3.5–5.2)
ALK PHOS: 83 U/L (ref 39–117)
ALT: 9 U/L (ref 0–35)
AST: 13 U/L (ref 0–37)
BILIRUBIN TOTAL: 0.7 mg/dL (ref 0.2–1.2)
BUN: 15 mg/dL (ref 6–23)
CALCIUM: 9.9 mg/dL (ref 8.4–10.5)
CHLORIDE: 100 meq/L (ref 96–112)
CO2: 29 mEq/L (ref 19–32)
CREATININE: 1.01 mg/dL (ref 0.40–1.20)
GFR: 55.77 mL/min — ABNORMAL LOW (ref >60.00)
Glucose, Bld: 104 mg/dL — ABNORMAL HIGH (ref 70–99)
Potassium: 4.2 mEq/L (ref 3.5–5.1)
Sodium: 139 mEq/L (ref 135–145)
TOTAL PROTEIN: 6.9 g/dL (ref 6.0–8.3)

## 2018-04-06 LAB — CBC
HCT: 37.8 % (ref 36.0–46.0)
Hemoglobin: 13 g/dL (ref 12.0–15.0)
MCHC: 34.3 g/dL (ref 30.0–36.0)
MCV: 93.7 fl (ref 78.0–100.0)
PLATELETS: 263 10*3/uL (ref 150.0–400.0)
RBC: 4.04 Mil/uL (ref 3.87–5.11)
RDW: 12.8 % (ref 11.5–15.5)
WBC: 7.3 10*3/uL (ref 4.0–10.5)

## 2018-04-06 LAB — LIPID PANEL
CHOLESTEROL: 152 mg/dL (ref 0–200)
HDL: 45.3 mg/dL (ref >39.00)
LDL Cholesterol: 77 mg/dL (ref 0–99)
NonHDL: 106.79
TRIGLYCERIDES: 151 mg/dL — AB (ref 0.0–149.0)
Total CHOL/HDL Ratio: 3
VLDL: 30.2 mg/dL (ref 0.0–40.0)

## 2018-04-06 LAB — TSH: TSH: 2.06 u[IU]/mL (ref 0.35–4.50)

## 2018-04-06 LAB — HEMOGLOBIN A1C: Hgb A1c MFr Bld: 5.9 % (ref 4.6–6.5)

## 2018-04-06 LAB — SEDIMENTATION RATE: Sed Rate: 24 mm/hr (ref 0–30)

## 2018-04-07 ENCOUNTER — Telehealth: Payer: Self-pay | Admitting: *Deleted

## 2018-04-07 ENCOUNTER — Ambulatory Visit (HOSPITAL_BASED_OUTPATIENT_CLINIC_OR_DEPARTMENT_OTHER)
Admission: RE | Admit: 2018-04-07 | Discharge: 2018-04-07 | Disposition: A | Discharge status: Home / Self Care | Payer: Medicare Other | Source: Ambulatory Visit | Attending: Family Medicine | Admitting: Family Medicine

## 2018-04-07 ENCOUNTER — Encounter (HOSPITAL_BASED_OUTPATIENT_CLINIC_OR_DEPARTMENT_OTHER): Payer: Self-pay

## 2018-04-07 DIAGNOSIS — R221 Localized swelling, mass and lump, neck: Secondary | ICD-10-CM | POA: Diagnosis not present

## 2018-04-07 DIAGNOSIS — M542 Cervicalgia: Secondary | ICD-10-CM

## 2018-04-07 DIAGNOSIS — L0211 Cutaneous abscess of neck: Secondary | ICD-10-CM | POA: Insufficient documentation

## 2018-04-07 MED ORDER — IOPAMIDOL (ISOVUE-300) INJECTION 61%
100.0000 mL | Freq: Once | INTRAVENOUS | Status: AC | PRN
  Administered 2018-04-07: 75 mL via INTRAVENOUS

## 2018-04-07 NOTE — Telephone Encounter (Signed)
Received End of Care Orders for review from [facility]; forwarded to provider/SLS 08/08

## 2018-04-08 ENCOUNTER — Other Ambulatory Visit: Payer: Self-pay | Admitting: Family Medicine

## 2018-04-08 DIAGNOSIS — L0291 Cutaneous abscess, unspecified: Secondary | ICD-10-CM

## 2018-04-10 DIAGNOSIS — L0291 Cutaneous abscess, unspecified: Secondary | ICD-10-CM | POA: Insufficient documentation

## 2018-04-10 NOTE — Assessment & Plan Note (Signed)
Confirmed on xray but no acute disc disease noted.

## 2018-04-10 NOTE — Assessment & Plan Note (Signed)
Improved afte vestibula rehab at [facility] but did have a recent flare. Reminded to hydrate well.

## 2018-04-10 NOTE — Assessment & Plan Note (Signed)
In skin at base of cervical spine confirmed by CT scan. Stated on antibiotics and referred to general surgery for further evaluation and excision. Seek care if worsens.

## 2018-04-14 DIAGNOSIS — L723 Sebaceous cyst: Secondary | ICD-10-CM | POA: Diagnosis not present

## 2018-04-14 DIAGNOSIS — L089 Local infection of the skin and subcutaneous tissue, unspecified: Secondary | ICD-10-CM | POA: Diagnosis not present

## 2018-04-15 DIAGNOSIS — Z5189 Encounter for other specified aftercare: Secondary | ICD-10-CM | POA: Diagnosis not present

## 2018-04-18 DIAGNOSIS — Z5189 Encounter for other specified aftercare: Secondary | ICD-10-CM | POA: Diagnosis not present

## 2018-04-19 DIAGNOSIS — Z5189 Encounter for other specified aftercare: Secondary | ICD-10-CM | POA: Diagnosis not present

## 2018-04-20 NOTE — Progress Notes (Addendum)
Subjective:   Tracy Miles is a 84 y.o. female who presents for Medicare Annual (Subsequent) preventive examination.  Review of Systems: No ROS.  Medicare Wellness Visit. Additional risk factors are reflected in the social history.   Sleep patterns:  Wears CPAP. Sleept 7-8 hrs. Does not nap. Home Safety/Smoke Alarms: Feels safe in home. Smoke alarms in place. Lives alone in cottage in [place] community. Walk in shower. Emergency pull cords in place.    Female:       Mammo-utd       Dexa scan- pt states she will wait until next year.          Objective:     Vitals: There were no vitals taken for this visit.  There is no height or weight on file to calculate BMI.  Advanced Directives 09/29/2016 09/06/2015 08/09/2015 08/05/2015 08/05/2015 03/22/2015 03/07/2015  Does Patient Have a Medical Advance Directive? Yes Yes Yes Yes Yes Yes Yes  Type of Paramedic of [place];Living will [place];Living will [place];Living will Healthcare Power of [place] of [place];Living will [place];Living will  Does patient want to make changes to medical advance directive? - Yes - information given - No - Patient declined - - No - Patient declined  Copy of [place] in Chart? Yes No - copy requested - No - copy requested - - No - copy requested    Tobacco Social History   Tobacco Use  Smoking Status Never Smoker  Smokeless Tobacco Never Used  Tobacco Comment   never used tobacco     Counseling given: Not Answered Comment: never used tobacco   Clinical Intake:                       Past Medical History:  Diagnosis Date  . Anemia   . Aortic stenosis    Very mild  . Benign paroxysmal positional vertigo 12/17/2013  . Chicken pox as a child  . Colon polyp   . Coronary artery disease   . DDD (degenerative disc disease) 11/16/2013  .  Hyperlipidemia   . Hypertension   . Iron deficiency anemia 11/13/2013  . Mumps child and teenager  . OA (osteoarthritis) 11/19/2013   S/p L TKR  . Obesity   . OSA on CPAP   . Pancreatitis    post hysterectomy  . Persistent atrial fibrillation ([place])   . Personal history of colonic polyps 02/25/2014  . Personal history of DVT (deep vein thrombosis) X 2   "left"  . Sleep apnea   . Spinal stenosis    Past Surgical History:  Procedure Laterality Date  . APPENDECTOMY    . CARDIOVERSION N/A 08/09/2015   Procedure: CARDIOVERSION;  Surgeon: Jerline Pain, MD;  Location: [facility];  Service: Cardiovascular;  Laterality: N/A;  . CATARACT EXTRACTION W/ INTRAOCULAR LENS  IMPLANT, BILATERAL Bilateral 2016  . CYST REMOVAL HAND Bilateral 1990's   "played to much golf"  . DILATION AND CURETTAGE OF UTERUS    . LUMBAR LAMINECTOMY  40 yrs ago   "L3-4"  . MENISCUS REPAIR Bilateral 2000 and 2010  . REPLACEMENT TOTAL KNEE Left 2013  . TONSILECTOMY, ADENOIDECTOMY, BILATERAL MYRINGOTOMY AND TUBES  child  . TOTAL ABDOMINAL HYSTERECTOMY  1995   had 2 tumors- benign  . TUBAL LIGATION  84 years old   Family History  Problem Relation Age of Onset  .  Heart attack Mother 17  . Hyperlipidemia Mother        ?  Marland Kitchen Dementia Mother   . Pernicious anemia Mother   . COPD Father        smoker  . Cancer Father 53       prostate  . Heart disease Brother        quadruple bipass surgery  . Hyperlipidemia Brother   . Hypertension Brother   . Diabetes Maternal Grandmother        type 2  . Pernicious anemia Maternal Grandmother   . Gout Son   . Atrial fibrillation Son   . Hyperlipidemia Son   . Cancer Maternal Grandfather        liver  . Cancer Paternal Grandmother        lung- doesn't think she smokes?  . Stroke Paternal Grandfather   . Cancer Son 50       non hodgin's lymphoma  . Gout Son   . Hyperlipidemia Son   . Sleep apnea Son   . Colon cancer Neg Hx   . Pancreatic cancer Neg Hx   . Stomach  cancer Neg Hx   . Throat cancer Neg Hx   . Liver disease Neg Hx    Social History   Socioeconomic History  . Marital status: Widowed    Spouse name: Not on file  . Number of children: 3  . Years of education: Not on file  . Highest education level: Not on file  Occupational History  . Occupation: retired Tour manager  . Financial resource strain: Not on file  . Food insecurity:    Worry: Not on file    Inability: Not on file  . Transportation needs:    Medical: Not on file    Non-medical: Not on file  Tobacco Use  . Smoking status: Never Smoker  . Smokeless tobacco: Never Used  . Tobacco comment: never used tobacco  Substance and Sexual Activity  . Alcohol use: No  . Drug use: No  . Sexual activity: Never    Comment: lives at [place], low sodium diet  Lifestyle  . Physical activity:    Days per week: Not on file    Minutes per session: Not on file  . Stress: Not on file  Relationships  . Social connections:    Talks on phone: Not on file    Gets together: Not on file    Attends religious service: Not on file    Active member of club or organization: Not on file    Attends meetings of clubs or organizations: Not on file    Relationship status: Not on file  Other Topics Concern  . Not on file  Social History Narrative  . Not on file    Outpatient Encounter Medications as of 04/21/2018  Medication Sig  . acetaminophen (TYLENOL) 325 MG tablet Take 325 mg by mouth daily.   Marland Kitchen apixaban (ELIQUIS) 5 MG TABS tablet Take 1 tablet (5 mg total) by mouth 2 (two) times daily.  Marland Kitchen atorvastatin (LIPITOR) 20 MG tablet Take 1 tablet (20 mg total) by mouth daily at 6 PM.  . bumetanide (BUMEX) 1 MG tablet TAKE ONE (1) TABLET BY MOUTH EACH DAY  . calcium carbonate (OS-CAL) 600 MG TABS tablet Take 600 mg by mouth 2 (two) times daily with a meal.  . diltiazem (CARDIZEM CD) 120 MG 24 hr capsule Take 1 capsule (120 mg total) by mouth daily.  Marland Kitchen  diltiazem (CARDIZEM) 30 MG  tablet Take 1 tablet every 4 hours AS NEEDED for heart rate >100 as long as blood pressure >100.  Marland Kitchen dofetilide (TIKOSYN) 250 MCG capsule Take 1 capsule (250 mcg total) by mouth 2 (two) times daily.  Marland Kitchen doxycycline (VIBRA-TABS) 100 MG tablet Take 1 tablet (100 mg total) by mouth 2 (two) times daily.  Marland Kitchen lactobacillus acidophilus (BACID) TABS tablet Take 1 tablet by mouth daily.   Marland Kitchen lisinopril (PRINIVIL,ZESTRIL) 10 MG tablet Take 1 tablet (10 mg total) by mouth daily.  Marland Kitchen loratadine (CLARITIN) 10 MG tablet Take 10 mg by mouth daily.   . meclizine (ANTIVERT) 25 MG tablet Take 1 tablet (25 mg total) by mouth 3 (three) times daily as needed for dizziness.  . potassium chloride (KLOR-CON 10) 10 MEQ tablet Take one tablet in the morning and two tablets in the pm  . psyllium (REGULOID) 0.52 g capsule Take 0.52 g by mouth 2 (two) times daily.   . valACYclovir (VALTREX) 1000 MG tablet Take 1 tablet (1,000 mg total) by mouth 3 (three) times daily.   No facility-administered encounter medications on file as of 04/21/2018.     Activities of Daily Living No flowsheet data found.  Patient Care Team: [name], MD as PCP - General (Family Medicine) Clayborne Artist, NP as Nurse Practitioner (Cardiology) Rigoberto Noel, MD as Consulting Physician (Pulmonary Disease) Melrose Nakayama, MD as Consulting Physician (Orthopedic Surgery)    Assessment:   This is a routine wellness examination for [place]. Physical assessment deferred to PCP.  Exercise Activities and Dietary recommendations   Diet (meal preparation, eat out, water intake, caffeinated beverages, dairy products, fruits and vegetables):  Breakfast: cheerios or yogurt or oatmeal with fruit Lunch: sandwich or salad Dinner:  Chicken and salad Water- several bottles per day  Goals    . Would love to play golf again       Fall Risk Fall Risk  09/29/2016 09/06/2015 08/23/2014 01/03/2014  Falls in the past year? No No Yes Yes  Number falls in  past yr: - - 1 1  Injury with Fall? - - - No   Depression Screen PHQ 2/9 Scores 09/29/2016 09/06/2015 08/23/2014  PHQ - 2 Score 0 0 0     Cognitive Function MMSE - Mini Mental State Exam 09/29/2016  Orientation to time 5  Orientation to Place 5  Registration 3  Attention/ Calculation 5  Recall 3  Language- name 2 objects 2  Language- repeat 1  Language- follow 3 step command 3  Language- read & follow direction 1  Write a sentence 1  Copy design 1  Total score 30        Immunization History  Administered Date(s) Administered  . Influenza, High Dose Seasonal PF 05/11/2017  . Influenza,inj,Quad PF,6+ Mos 04/30/2016  . Influenza-Unspecified 05/31/2013, 05/01/2014, 06/11/2015  . Pneumococcal Conjugate-13 04/02/2014  . Pneumococcal Polysaccharide-23 08/31/1998, 09/06/2015  . Tdap 07/31/2012    Screening Tests Health Maintenance  Topic Date Due  . INFLUENZA VACCINE  03/31/2018  . COLONOSCOPY  11/22/2018  . TETANUS/TDAP  07/31/2022  . DEXA SCAN  Completed  . PNA vac Low Risk Adult  Completed      Plan:    Please schedule your next medicare wellness visit with me in 1 yr.  Continue to eat heart healthy diet (full of fruits, vegetables, whole grains, lean protein, water--limit salt, fat, and sugar intake) and increase physical activity as tolerated.  Continue doing brain stimulating activities (  puzzles, reading, adult coloring books, staying active) to keep memory sharp.     I have personally reviewed and noted the following in the patient's chart:   . Medical and social history . Use of alcohol, tobacco or illicit drugs  . Current medications and supplements . Functional ability and status . Nutritional status . Physical activity . Advanced directives . List of other physicians . Hospitalizations, surgeries, and ER visits in previous 12 months . Vitals . Screenings to include cognitive, depression, and falls . Referrals and appointments  In addition, I have  reviewed and discussed with patient certain preventive protocols, quality metrics, and best practice recommendations. A written personalized care plan for preventive services as well as general preventive health recommendations were provided to patient.     Shela Nevin, [place]  04/20/2018    Medical screening examination/treatment was performed by qualified clinical staff member and as supervising physician I was immediately available for consultation/collaboration. I have reviewed documentation and agree with assessment and plan.  [name], MD

## 2018-04-21 ENCOUNTER — Ambulatory Visit (INDEPENDENT_AMBULATORY_CARE_PROVIDER_SITE_OTHER): Payer: Medicare Other | Admitting: *Deleted

## 2018-04-21 ENCOUNTER — Encounter: Payer: Self-pay | Admitting: *Deleted

## 2018-04-21 VITALS — BP 145/73 | HR 73 | Ht 63.0 in | Wt 272.2 lb

## 2018-04-21 DIAGNOSIS — Z0000 Encounter for general adult medical examination without abnormal findings: Secondary | ICD-10-CM | POA: Diagnosis not present

## 2018-04-21 NOTE — Patient Instructions (Signed)
Please schedule your next medicare wellness visit with me in 1 yr.  Continue to eat heart healthy diet (full of fruits, vegetables, whole grains, lean protein, water--limit salt, fat, and sugar intake) and increase physical activity as tolerated.  Continue doing brain stimulating activities (puzzles, reading, adult coloring books, staying active) to keep memory sharp.    Tracy Miles , Thank you for taking time to come for your Medicare Wellness Visit. I appreciate your ongoing commitment to your health goals. Please review the following plan we discussed and let me know if I can assist you in the future.   These are the goals we discussed: Goals    . Maintain current health and independence       This is a list of the screening recommended for you and due dates:  Health Maintenance  Topic Date Due  . Flu Shot  05/21/2018*  . Colon Cancer Screening  11/22/2018  . Tetanus Vaccine  07/31/2022  . DEXA scan (bone density measurement)  Completed  . Pneumonia vaccines  Completed  *Topic was postponed. The date shown is not the original due date.    Health Maintenance for Postmenopausal Women Menopause is a normal process in which your reproductive ability comes to an end. This process happens gradually over a span of months to years, usually between the ages of 70 and 6. Menopause is complete when you have missed 12 consecutive menstrual periods. It is important to talk with your health care provider about some of the most common conditions that affect postmenopausal women, such as heart disease, cancer, and bone loss (osteoporosis). Adopting a healthy lifestyle and getting preventive care can help to promote your health and wellness. Those actions can also lower your chances of developing some of these common conditions. What should I know about menopause? During menopause, you may experience a number of symptoms, such as:  Moderate-to-severe hot flashes.  Night sweats.  Decrease in sex  drive.  Mood swings.  Headaches.  Tiredness.  Irritability.  Memory problems.  Insomnia.  Choosing to treat or not to treat menopausal changes is an individual decision that you make with your health care provider. What should I know about hormone replacement therapy and supplements? Hormone therapy products are effective for treating symptoms that are associated with menopause, such as hot flashes and night sweats. Hormone replacement carries certain risks, especially as you become older. If you are thinking about using estrogen or estrogen with progestin treatments, discuss the benefits and risks with your health care provider. What should I know about heart disease and stroke? Heart disease, heart attack, and stroke become more likely as you age. This may be due, in part, to the hormonal changes that your body experiences during menopause. These can affect how your body processes dietary fats, triglycerides, and cholesterol. Heart attack and stroke are both medical emergencies. There are many things that you can do to help prevent heart disease and stroke:  Have your blood pressure checked at least every 1-2 years. High blood pressure causes heart disease and increases the risk of stroke.  If you are 16-48 years old, ask your health care provider if you should take aspirin to prevent a heart attack or a stroke.  Do not use any tobacco products, including cigarettes, chewing tobacco, or electronic cigarettes. If you need help quitting, ask your health care provider.  It is important to eat a healthy diet and maintain a healthy weight. ? Be sure to include plenty of vegetables, fruits,  low-fat dairy products, and lean protein. ? Avoid eating foods that are high in solid fats, added sugars, or salt (sodium).  Get regular exercise. This is one of the most important things that you can do for your health. ? Try to exercise for at least 150 minutes each week. The type of exercise that  you do should increase your heart rate and make you sweat. This is known as moderate-intensity exercise. ? Try to do strengthening exercises at least twice each week. Do these in addition to the moderate-intensity exercise.  Know your numbers.Ask your health care provider to check your cholesterol and your blood glucose. Continue to have your blood tested as directed by your health care provider.  What should I know about cancer screening? There are several types of cancer. Take the following steps to reduce your risk and to catch any cancer development as early as possible. Breast Cancer  Practice breast self-awareness. ? This means understanding how your breasts normally appear and feel. ? It also means doing regular breast self-exams. Let your health care provider know about any changes, no matter how small.  If you are 73 or older, have a clinician do a breast exam (clinical breast exam or CBE) every year. Depending on your age, family history, and medical history, it may be recommended that you also have a yearly breast X-ray (mammogram).  If you have a family history of breast cancer, talk with your health care provider about genetic screening.  If you are at high risk for breast cancer, talk with your health care provider about having an MRI and a mammogram every year.  Breast cancer (BRCA) gene test is recommended for women who have family members with BRCA-related cancers. Results of the assessment will determine the need for genetic counseling and BRCA1 and for BRCA2 testing. BRCA-related cancers include these types: ? Breast. This occurs in males or females. ? Ovarian. ? Tubal. This may also be called fallopian tube cancer. ? Cancer of the abdominal or pelvic lining (peritoneal cancer). ? Prostate. ? Pancreatic.  Cervical, Uterine, and Ovarian Cancer Your health care provider may recommend that you be screened regularly for cancer of the pelvic organs. These include your  ovaries, uterus, and vagina. This screening involves a pelvic exam, which includes checking for microscopic changes to the surface of your cervix (Pap test).  For women ages 21-65, health care providers may recommend a pelvic exam and a Pap test every three years. For women ages 65-65, they may recommend the Pap test and pelvic exam, combined with testing for human papilloma virus (HPV), every five years. Some types of HPV increase your risk of cervical cancer. Testing for HPV may also be done on women of any age who have unclear Pap test results.  Other health care providers may not recommend any screening for nonpregnant women who are considered low risk for pelvic cancer and have no symptoms. Ask your health care provider if a screening pelvic exam is right for you.  If you have had past treatment for cervical cancer or a condition that could lead to cancer, you need Pap tests and screening for cancer for at least 20 years after your treatment. If Pap tests have been discontinued for you, your risk factors (such as having a new sexual partner) need to be reassessed to determine if you should start having screenings again. Some women have medical problems that increase the chance of getting cervical cancer. In these cases, your health care provider may  recommend that you have screening and Pap tests more often.  If you have a family history of uterine cancer or ovarian cancer, talk with your health care provider about genetic screening.  If you have vaginal bleeding after reaching menopause, tell your health care provider.  There are currently no reliable tests available to screen for ovarian cancer.  Lung Cancer Lung cancer screening is recommended for adults 58-25 years old who are at high risk for lung cancer because of a history of smoking. A yearly low-dose CT scan of the lungs is recommended if you:  Currently smoke.  Have a history of at least 30 pack-years of smoking and you currently  smoke or have quit within the past 15 years. A pack-year is smoking an average of one pack of cigarettes per day for one year.  Yearly screening should:  Continue until it has been 15 years since you quit.  Stop if you develop a health problem that would prevent you from having lung cancer treatment.  Colorectal Cancer  This type of cancer can be detected and can often be prevented.  Routine colorectal cancer screening usually begins at age 30 and continues through age 26.  If you have risk factors for colon cancer, your health care provider may recommend that you be screened at an earlier age.  If you have a family history of colorectal cancer, talk with your health care provider about genetic screening.  Your health care provider may also recommend using home test kits to check for hidden blood in your stool.  A small camera at the end of a tube can be used to examine your colon directly (sigmoidoscopy or colonoscopy). This is done to check for the earliest forms of colorectal cancer.  Direct examination of the colon should be repeated every 5-10 years until age 74. However, if early forms of precancerous polyps or small growths are found or if you have a family history or genetic risk for colorectal cancer, you may need to be screened more often.  Skin Cancer  Check your skin from head to toe regularly.  Monitor any moles. Be sure to tell your health care provider: ? About any new moles or changes in moles, especially if there is a change in a mole's shape or color. ? If you have a mole that is larger than the size of a pencil eraser.  If any of your family members has a history of skin cancer, especially at a young age, talk with your health care provider about genetic screening.  Always use sunscreen. Apply sunscreen liberally and repeatedly throughout the day.  Whenever you are outside, protect yourself by wearing long sleeves, pants, a wide-brimmed hat, and  sunglasses.  What should I know about osteoporosis? Osteoporosis is a condition in which bone destruction happens more quickly than new bone creation. After menopause, you may be at an increased risk for osteoporosis. To help prevent osteoporosis or the bone fractures that can happen because of osteoporosis, the following is recommended:  If you are 70-2 years old, get at least 1,000 mg of calcium and at least 600 mg of vitamin D per day.  If you are older than age 87 but younger than age 8, get at least 1,200 mg of calcium and at least 600 mg of vitamin D per day.  If you are older than age 87, get at least 1,200 mg of calcium and at least 800 mg of vitamin D per day.  Smoking and excessive alcohol  intake increase the risk of osteoporosis. Eat foods that are rich in calcium and vitamin D, and do weight-bearing exercises several times each week as directed by your health care provider. What should I know about how menopause affects my mental health? Depression may occur at any age, but it is more common as you become older. Common symptoms of depression include:  Low or sad mood.  Changes in sleep patterns.  Changes in appetite or eating patterns.  Feeling an overall lack of motivation or enjoyment of activities that you previously enjoyed.  Frequent crying spells.  Talk with your health care provider if you think that you are experiencing depression. What should I know about immunizations? It is important that you get and maintain your immunizations. These include:  Tetanus, diphtheria, and pertussis (Tdap) booster vaccine.  Influenza every year before the flu season begins.  Pneumonia vaccine.  Shingles vaccine.  Your health care provider may also recommend other immunizations. This information is not intended to replace advice given to you by your health care provider. Make sure you discuss any questions you have with your health care provider. Document Released:  10/09/2005 Document Revised: 03/06/2016 Document Reviewed: 05/21/2015 Elsevier Interactive Patient Education  2018 [employer].

## 2018-04-22 DIAGNOSIS — Z5189 Encounter for other specified aftercare: Secondary | ICD-10-CM | POA: Diagnosis not present

## 2018-04-25 ENCOUNTER — Other Ambulatory Visit: Payer: Self-pay | Admitting: Family Medicine

## 2018-04-25 DIAGNOSIS — Z1231 Encounter for screening mammogram for malignant neoplasm of breast: Secondary | ICD-10-CM

## 2018-04-26 DIAGNOSIS — Z5189 Encounter for other specified aftercare: Secondary | ICD-10-CM | POA: Diagnosis not present

## 2018-04-28 DIAGNOSIS — Z5189 Encounter for other specified aftercare: Secondary | ICD-10-CM | POA: Diagnosis not present

## 2018-05-02 DIAGNOSIS — Z5189 Encounter for other specified aftercare: Secondary | ICD-10-CM | POA: Diagnosis not present

## 2018-05-03 ENCOUNTER — Other Ambulatory Visit (HOSPITAL_COMMUNITY): Payer: Self-pay | Admitting: *Deleted

## 2018-05-03 MED ORDER — DOFETILIDE 250 MCG PO CAPS: 250.0000 ug | ORAL_CAPSULE | Freq: Two times a day (BID) | ORAL | 6 refills | Status: DC

## 2018-05-04 DIAGNOSIS — Z5189 Encounter for other specified aftercare: Secondary | ICD-10-CM | POA: Diagnosis not present

## 2018-05-06 DIAGNOSIS — Z5189 Encounter for other specified aftercare: Secondary | ICD-10-CM | POA: Diagnosis not present

## 2018-05-10 DIAGNOSIS — L089 Local infection of the skin and subcutaneous tissue, unspecified: Secondary | ICD-10-CM | POA: Diagnosis not present

## 2018-05-10 DIAGNOSIS — L723 Sebaceous cyst: Secondary | ICD-10-CM | POA: Diagnosis not present

## 2018-05-11 ENCOUNTER — Encounter (HOSPITAL_COMMUNITY): Payer: Self-pay | Admitting: Nurse Practitioner

## 2018-05-11 ENCOUNTER — Ambulatory Visit (HOSPITAL_COMMUNITY)
Admission: RE | Admit: 2018-05-11 | Discharge: 2018-05-11 | Disposition: A | Discharge status: Home / Self Care | Payer: Medicare Other | Source: Ambulatory Visit | Attending: Nurse Practitioner | Admitting: Nurse Practitioner

## 2018-05-11 VITALS — BP 128/74 | HR 75 | Ht 63.0 in | Wt 270.6 lb

## 2018-05-11 DIAGNOSIS — Z79899 Other long term (current) drug therapy: Secondary | ICD-10-CM | POA: Diagnosis not present

## 2018-05-11 DIAGNOSIS — G4733 Obstructive sleep apnea (adult) (pediatric): Secondary | ICD-10-CM | POA: Diagnosis not present

## 2018-05-11 DIAGNOSIS — Z880 Allergy status to penicillin: Secondary | ICD-10-CM | POA: Insufficient documentation

## 2018-05-11 DIAGNOSIS — I10 Essential (primary) hypertension: Secondary | ICD-10-CM | POA: Insufficient documentation

## 2018-05-11 DIAGNOSIS — Z86718 Personal history of other venous thrombosis and embolism: Secondary | ICD-10-CM | POA: Insufficient documentation

## 2018-05-11 DIAGNOSIS — I4891 Unspecified atrial fibrillation: Secondary | ICD-10-CM | POA: Insufficient documentation

## 2018-05-11 DIAGNOSIS — Z90710 Acquired absence of both cervix and uterus: Secondary | ICD-10-CM | POA: Insufficient documentation

## 2018-05-11 DIAGNOSIS — I2510 Atherosclerotic heart disease of native coronary artery without angina pectoris: Secondary | ICD-10-CM | POA: Insufficient documentation

## 2018-05-11 DIAGNOSIS — I480 Paroxysmal atrial fibrillation: Secondary | ICD-10-CM

## 2018-05-11 DIAGNOSIS — Z888 Allergy status to other drugs, medicaments and biological substances status: Secondary | ICD-10-CM | POA: Diagnosis not present

## 2018-05-11 DIAGNOSIS — R42 Dizziness and giddiness: Secondary | ICD-10-CM | POA: Diagnosis not present

## 2018-05-11 DIAGNOSIS — E7800 Pure hypercholesterolemia, unspecified: Secondary | ICD-10-CM | POA: Insufficient documentation

## 2018-05-11 DIAGNOSIS — Z9889 Other specified postprocedural states: Secondary | ICD-10-CM | POA: Diagnosis not present

## 2018-05-11 DIAGNOSIS — Z7901 Long term (current) use of anticoagulants: Secondary | ICD-10-CM | POA: Diagnosis not present

## 2018-05-11 DIAGNOSIS — Z8249 Family history of ischemic heart disease and other diseases of the circulatory system: Secondary | ICD-10-CM | POA: Diagnosis not present

## 2018-05-11 DIAGNOSIS — Z9851 Tubal ligation status: Secondary | ICD-10-CM | POA: Insufficient documentation

## 2018-05-11 LAB — BASIC METABOLIC PANEL
Anion gap: 10 (ref 5–15)
BUN: 14 mg/dL (ref 8–23)
CHLORIDE: 100 mmol/L (ref 98–111)
CO2: 28 mmol/L (ref 22–32)
CREATININE: 1.05 mg/dL — AB (ref 0.44–1.00)
Calcium: 9.5 mg/dL (ref 8.9–10.3)
GFR calc non Af Amer: 48 mL/min — ABNORMAL LOW (ref >60)
GFR, EST AFRICAN AMERICAN: 56 mL/min — AB (ref >60)
Glucose, Bld: 106 mg/dL — ABNORMAL HIGH (ref 70–99)
Potassium: 4.2 mmol/L (ref 3.5–5.1)
Sodium: 138 mmol/L (ref 135–145)

## 2018-05-11 LAB — MAGNESIUM: Magnesium: 2.3 mg/dL (ref 1.7–2.4)

## 2018-05-11 NOTE — Progress Notes (Signed)
.aPatient ID: Tracy Miles, female   DOB: 1934-02-19, 84 y.o.   MRN: [id number]     Primary Care Physician: [name], MD Referring Physician: Dr. [name] is a 84 y.o. female with a h/o paroxysmal afib  on tikosyn for f/u. Tracy Miles reports no occurence of afib.  Continues on Eliquis at 5 mg bid. Mentions that Tracy Miles has noted some lightheadedness mostly in the am and feeling slightly off balance, no falls, noted around 2 weeks ago.Nothing  has really changed in her health. Is still using cpap. No issues with Eliquis.  F/u in afib clinic for surveillance of Tikosyn. Tracy Miles is maintaining  SR. When I saw her last Tracy Miles was c/o of lightheadedness. Carotid dopplers were ordered and clear of significant obstruction. Tracy Miles was then found to have an abscess st the base of her posterior neck which was excised but continues to  drain. Tracy Miles may have to have further surgery to resolve.  Today, Tracy Miles denies symptoms of palpitations, chest pain, shortness of breath, orthopnea, PND, lower extremity edema, dizziness, presyncope, syncope, or neurologic sequela. The patient is tolerating medications without difficulties and is otherwise without complaint today.   Past Medical History  Diagnosis Date  . Hypertension   . Heart murmur   . Coronary artery disease   . Atrial fibrillation ([place])   . Anemia   . Arthritis   . Colon polyp   . Personal history of DVT (deep vein thrombosis) X 2    "left"  . Hyperlipidemia   . Pancreatitis     post hysterectomy  . Sleep apnea   . Aortic stenosis     Very mild  . Hypercholesteremia   . OSA on CPAP   . PAF (paroxysmal atrial fibrillation) ([place])   . History of valvular heart disease     LEFT  . Chicken pox as a child  . Measles as a child  . Mumps child and teenager  . Allergy   . Obesity   . Unspecified constipation 11/16/2013  . Heme positive stool 11/16/2013  . DDD (degenerative disc disease) 11/16/2013  . Unspecified sleep apnea 11/19/2013  .  Allergic state 11/19/2013  . OA (osteoarthritis) 11/19/2013    S/p L TKR  . Benign paroxysmal positional vertigo 12/17/2013  . Abnormal results of thyroid function studies 12/17/2013  . Iron deficiency anemia 11/13/2013  . Personal history of colonic polyps 02/25/2014  . Spinal stenosis   . Obstructive sleep apnea 02/22/2015  . Visit for monitoring Tikosyn therapy 08/11/2015  . Medicare annual wellness visit, subsequent 09/15/2015   Past Surgical History  Procedure Laterality Date  . Tonsilectomy, adenoidectomy, bilateral myringotomy and tubes  child  . Lumbar laminectomy  40 yrs ago    "L3-4"  . Tubal ligation  84 years old  . Total abdominal hysterectomy  1995    had 2 tumors- benign  . Replacement total knee Left 2013  . Cyst removal hand Bilateral 1990's    "played to much golf"  . Meniscus repair Bilateral 2000 and 2010  . Appendectomy    . Tonsillectomy and adenoidectomy    . Joint replacement    . Back surgery    . Cataract extraction w/ intraocular lens  implant, bilateral Bilateral 2016  . Dilation and curettage of uterus    . Cardioversion Tracy Miles 08/09/2015    Procedure: CARDIOVERSION;  Surgeon: Jerline Pain, MD;  Location: [place];  Service: Cardiovascular;  Laterality: Tracy Miles;  . Eye surgery  2016  cataracts    Current Outpatient Prescriptions  Medication Sig Dispense Refill  . acetaminophen (TYLENOL) 650 MG CR tablet Take 1,300 mg by mouth every evening.    Marland Kitchen apixaban (ELIQUIS) 5 MG TABS tablet Take 1 tablet (5 mg total) by mouth 2 (two) times daily. 180 tablet 2  . atorvastatin (LIPITOR) 20 MG tablet Take 1 tablet (20 mg total) by mouth daily. 90 tablet 2  . bumetanide (BUMEX) 1 MG tablet TAKE ONE (1) TABLET BY MOUTH EVERY DAY 90 tablet 0  . calcium carbonate (OS-CAL) 600 MG TABS tablet Take 600 mg by mouth 2 (two) times daily with a meal.    . diltiazem (CARDIZEM CD) 240 MG 24 hr capsule Take 240 mg by mouth daily.    Marland Kitchen dofetilide (TIKOSYN) 250 MCG capsule Take 1  capsule (250 mcg total) by mouth 2 (two) times daily. 60 capsule 3  . Lactobacillus (PROBIOTIC ACIDOPHILUS) TABS Take 1 tablet by mouth daily. Reported on 03/04/2016    . lisinopril (PRINIVIL,ZESTRIL) 10 MG tablet Take 1 tablet (10 mg total) by mouth daily. 90 tablet 2  . loratadine (CLARITIN) 10 MG tablet Take 10 mg by mouth daily.     . potassium chloride (KLOR-CON 10) 10 MEQ tablet TAKE ONE (1) TABLET EACH DAY 90 tablet 2  . psyllium (REGULOID) 0.52 g capsule Take 0.52 g by mouth daily.     No current facility-administered medications for this encounter.    Allergies  Allergen Reactions  . Gabapentin Swelling  . Lactose Intolerance (Gi) Other (See Comments)    Bothers her stomach  . Zebeta [Bisoprolol Fumarate] Nausea Only  . Penicillins Rash    Social History   Social History  . Marital Status: Widowed    Spouse Name: Tracy Miles  . Number of Children: 3  . Years of Education: Tracy Miles   Occupational History  . retired Pharmacist, hospital    Social History Main Topics  . Smoking status: Never Smoker   . Smokeless tobacco: Never Used     Comment: never used tobacco  . Alcohol Use: No  . Drug Use: No  . Sexual Activity: No     Comment: lives at [place], low sodium diet   Other Topics Concern  . Not on file   Social History Narrative    Family History  Problem Relation Age of Onset  . Heart attack Mother 87  . Hyperlipidemia Mother     ?  Marland Kitchen Dementia Mother   . Pernicious anemia Mother   . COPD Father     smoker  . Cancer Father 51    prostate  . Heart disease Brother     quadruple bipass surgery  . Hyperlipidemia Brother   . Hypertension Brother   . Diabetes Maternal Grandmother     type 2  . Pernicious anemia Maternal Grandmother   . Colon cancer Neg Hx   . Pancreatic cancer Neg Hx   . Stomach cancer Neg Hx   . Throat cancer Neg Hx   . Liver disease Neg Hx   . Gout Son   . Atrial fibrillation Son   . Hyperlipidemia Son   . Cancer Maternal Grandfather     liver  .  Cancer Paternal Grandmother     lung- doesn't think Tracy Miles smokes?  . Stroke Paternal Grandfather   . Cancer Son 50    non hodgin's lymphoma  . Gout Son   . Hyperlipidemia Son   . Sleep apnea Son  ROS- All systems are reviewed and negative except as per the HPI above  Physical Exam: Filed Vitals:   03/04/16 1351  BP: 138/74  Pulse: 75  Height: 5' 3.5" (1.613 m)  Weight: 260 lb 12.8 oz (118.298 kg)    GEN- The patient is well appearing, alert and oriented x 3 today.   Head- normocephalic, atraumatic Eyes-  Sclera clear, conjunctiva pink Ears- hearing intact Oropharynx- clear Neck- supple, no JVP, soft bruit rt carotid Lymph- no cervical lymphadenopathy Lungs- Clear to ausculation bilaterally, normal work of breathing Heart- Regular rate and rhythm, no murmurs, rubs or gallops, PMI not laterally displaced GI- soft, NT, ND, + BS Extremities- no clubbing, cyanosis, or edema MS- no significant deformity or atrophy Skin- no rash or lesion Psych- euthymic mood, full affect Neuro- strength and sensation are intact  EKG- NSR at 75 bpm, Pr int 172 ms, QRS int 78 ms, Qtc 448 ms(qtc is stable) Epic records reviewed   Assessment and Plan: 1. Afib Doing well on tikosyn, continue 250 mg bid, reports no recent afib Continue daily cardizem with 30 mg as needed for bouts of afib Continue apixaban with chadsvasc score of 5 Bmet/mag pending  2. Lightheadedness Carotids ordered last visit without any significant obstruction  Tracy Miles went on to have treatment for BPPV and has improved   F/u in 4 months  PCP as scheduled  Butch Penny C. Marshaun Lortie, [facility] [address] [place], [place] [address] [phone]

## 2018-05-12 ENCOUNTER — Other Ambulatory Visit: Payer: Self-pay | Admitting: Family Medicine

## 2018-05-12 DIAGNOSIS — Z5189 Encounter for other specified aftercare: Secondary | ICD-10-CM | POA: Diagnosis not present

## 2018-05-16 DIAGNOSIS — Z5189 Encounter for other specified aftercare: Secondary | ICD-10-CM | POA: Diagnosis not present

## 2018-05-16 DIAGNOSIS — L02212 Cutaneous abscess of back [any part, except buttock]: Secondary | ICD-10-CM | POA: Diagnosis not present

## 2018-05-18 DIAGNOSIS — L02212 Cutaneous abscess of back [any part, except buttock]: Secondary | ICD-10-CM | POA: Diagnosis not present

## 2018-05-18 DIAGNOSIS — Z5189 Encounter for other specified aftercare: Secondary | ICD-10-CM | POA: Diagnosis not present

## 2018-05-19 ENCOUNTER — Ambulatory Visit (HOSPITAL_BASED_OUTPATIENT_CLINIC_OR_DEPARTMENT_OTHER)
Admission: RE | Admit: 2018-05-19 | Discharge: 2018-05-19 | Disposition: A | Discharge status: Home / Self Care | Payer: Medicare Other | Source: Ambulatory Visit | Attending: Family Medicine | Admitting: Family Medicine

## 2018-05-19 DIAGNOSIS — Z1231 Encounter for screening mammogram for malignant neoplasm of breast: Secondary | ICD-10-CM | POA: Diagnosis not present

## 2018-05-20 DIAGNOSIS — Z5189 Encounter for other specified aftercare: Secondary | ICD-10-CM | POA: Diagnosis not present

## 2018-05-24 DIAGNOSIS — Z5189 Encounter for other specified aftercare: Secondary | ICD-10-CM | POA: Diagnosis not present

## 2018-05-24 DIAGNOSIS — Z23 Encounter for immunization: Secondary | ICD-10-CM | POA: Diagnosis not present

## 2018-05-25 DIAGNOSIS — L723 Sebaceous cyst: Secondary | ICD-10-CM | POA: Diagnosis not present

## 2018-05-25 DIAGNOSIS — L089 Local infection of the skin and subcutaneous tissue, unspecified: Secondary | ICD-10-CM | POA: Diagnosis not present

## 2018-05-26 ENCOUNTER — Ambulatory Visit (INDEPENDENT_AMBULATORY_CARE_PROVIDER_SITE_OTHER): Payer: Medicare Other | Admitting: Family Medicine

## 2018-05-26 ENCOUNTER — Telehealth: Payer: Self-pay | Admitting: Family Medicine

## 2018-05-26 DIAGNOSIS — H8110 Benign paroxysmal vertigo, unspecified ear: Secondary | ICD-10-CM

## 2018-05-26 DIAGNOSIS — R739 Hyperglycemia, unspecified: Secondary | ICD-10-CM | POA: Diagnosis not present

## 2018-05-26 DIAGNOSIS — I481 Persistent atrial fibrillation: Secondary | ICD-10-CM | POA: Diagnosis not present

## 2018-05-26 DIAGNOSIS — E6609 Other obesity due to excess calories: Secondary | ICD-10-CM

## 2018-05-26 DIAGNOSIS — L0291 Cutaneous abscess, unspecified: Secondary | ICD-10-CM | POA: Diagnosis not present

## 2018-05-26 DIAGNOSIS — I10 Essential (primary) hypertension: Secondary | ICD-10-CM | POA: Diagnosis not present

## 2018-05-26 DIAGNOSIS — E782 Mixed hyperlipidemia: Secondary | ICD-10-CM

## 2018-05-26 DIAGNOSIS — I4819 Other persistent atrial fibrillation: Secondary | ICD-10-CM

## 2018-05-26 MED ORDER — BUMETANIDE 1 MG PO TABS: ORAL_TABLET | ORAL | 1 refills | Status: DC

## 2018-05-26 MED ORDER — APIXABAN 5 MG PO TABS: 5.0000 mg | ORAL_TABLET | Freq: Two times a day (BID) | ORAL | 1 refills | Status: DC

## 2018-05-26 MED ORDER — ATORVASTATIN CALCIUM 20 MG PO TABS: ORAL_TABLET | ORAL | 2 refills | Status: DC

## 2018-05-26 MED ORDER — LISINOPRIL 10 MG PO TABS: 10.0000 mg | ORAL_TABLET | Freq: Every day | ORAL | 1 refills | Status: DC

## 2018-05-26 NOTE — Telephone Encounter (Signed)
Rx re-sent, #180 and 1rf.

## 2018-05-26 NOTE — Telephone Encounter (Signed)
Copied from [place] [id number]. Topic: Quick Communication - See Telephone Encounter >> May 26, 2018 10:28 AM Bea Graff, NT wrote: CRM for notification. See Telephone encounter for: 05/26/18. [facility] calling to clairfy if the apixaban (ELIQUIS) 5 MG TABS tablet was to be a 90 day supply or 30 day supply. CB#: [phone]

## 2018-05-26 NOTE — Patient Instructions (Signed)

## 2018-05-29 NOTE — Assessment & Plan Note (Signed)
On her posterior neck it has been excised and she is feeling better at this time.

## 2018-05-29 NOTE — Progress Notes (Signed)
Subjective:    Patient ID: Tracy Miles, female    DOB: 1934-06-26, 84 y.o.   MRN: [id number]  No chief complaint on file.   HPI Patient is in today for follow up and overall she is doing better. After a course of vestibular rehab her BPV is much better and she has no remaining sense of dizziness. She has had to be seen by [facility] and Dr Rosendo Gros had to open and drain the cyst on the back of her neck that became infected. She had to return for packing and repacking for weeks but it is finally healing. No recent fever or hospitalization. Denies CP/palp/SOB/HA/congestion/fevers/GI or GU c/o. Taking meds as prescribed  Past Medical History:  Diagnosis Date  . Anemia   . Aortic stenosis    Very mild  . Benign paroxysmal positional vertigo 12/17/2013  . Chicken pox as a child  . Colon polyp   . Coronary artery disease   . DDD (degenerative disc disease) 11/16/2013  . Hyperlipidemia   . Hypertension   . Iron deficiency anemia 11/13/2013  . Mumps child and teenager  . OA (osteoarthritis) 11/19/2013   S/p L TKR  . Obesity   . OSA on CPAP   . Pancreatitis    post hysterectomy  . Persistent atrial fibrillation ([place])   . Personal history of colonic polyps 02/25/2014  . Personal history of DVT (deep vein thrombosis) X 2   "left"  . Sleep apnea   . Spinal stenosis     Past Surgical History:  Procedure Laterality Date  . APPENDECTOMY    . CARDIOVERSION N/A 08/09/2015   Procedure: CARDIOVERSION;  Surgeon: Jerline Pain, MD;  Location: [place];  Service: Cardiovascular;  Laterality: N/A;  . CATARACT EXTRACTION W/ INTRAOCULAR LENS  IMPLANT, BILATERAL Bilateral 2016  . CYST REMOVAL HAND Bilateral 1990's   "played to much golf"  . DILATION AND CURETTAGE OF UTERUS    . LUMBAR LAMINECTOMY  40 yrs ago   "L3-4"  . MENISCUS REPAIR Bilateral 2000 and 2010  . REPLACEMENT TOTAL KNEE Left 2013  . TONSILECTOMY, ADENOIDECTOMY, BILATERAL MYRINGOTOMY AND TUBES  child  . TOTAL  ABDOMINAL HYSTERECTOMY  1995   had 2 tumors- benign  . TUBAL LIGATION  84 years old    Family History  Problem Relation Age of Onset  . Heart attack Mother 41  . Hyperlipidemia Mother        ?  Marland Kitchen Dementia Mother   . Pernicious anemia Mother   . COPD Father        smoker  . Cancer Father 27       prostate  . Heart disease Brother        quadruple bipass surgery  . Hyperlipidemia Brother   . Hypertension Brother   . Diabetes Maternal Grandmother        type 2  . Pernicious anemia Maternal Grandmother   . Gout Son   . Atrial fibrillation Son   . Hyperlipidemia Son   . Cancer Maternal Grandfather        liver  . Cancer Paternal Grandmother        lung- doesn't think she smokes?  . Stroke Paternal Grandfather   . Cancer Son 50       non hodgin's lymphoma  . Gout Son   . Hyperlipidemia Son   . Sleep apnea Son   . Colon cancer Neg Hx   . Pancreatic cancer Neg Hx   . Stomach  cancer Neg Hx   . Throat cancer Neg Hx   . Liver disease Neg Hx     Social History   Socioeconomic History  . Marital status: Widowed    Spouse name: Not on file  . Number of children: 3  . Years of education: Not on file  . Highest education level: Not on file  Occupational History  . Occupation: retired Tour manager  . Financial resource strain: Not on file  . Food insecurity:    Worry: Not on file    Inability: Not on file  . Transportation needs:    Medical: Not on file    Non-medical: Not on file  Tobacco Use  . Smoking status: Never Smoker  . Smokeless tobacco: Never Used  . Tobacco comment: never used tobacco  Substance and Sexual Activity  . Alcohol use: No  . Drug use: No  . Sexual activity: Never    Comment: lives at [place], low sodium diet  Lifestyle  . Physical activity:    Days per week: Not on file    Minutes per session: Not on file  . Stress: Not on file  Relationships  . Social connections:    Talks on phone: Not on file    Gets together: Not  on file    Attends religious service: Not on file    Active member of club or organization: Not on file    Attends meetings of clubs or organizations: Not on file    Relationship status: Not on file  . Intimate partner violence:    Fear of current or ex partner: Not on file    Emotionally abused: Not on file    Physically abused: Not on file    Forced sexual activity: Not on file  Other Topics Concern  . Not on file  Social History Narrative  . Not on file    Outpatient Medications Prior to Visit  Medication Sig Dispense Refill  . acetaminophen (TYLENOL) 325 MG tablet Take 325 mg by mouth daily.     . calcium carbonate (OS-CAL) 600 MG TABS tablet Take 600 mg by mouth 2 (two) times daily with a meal.    . diltiazem (CARDIZEM CD) 120 MG 24 hr capsule Take 1 capsule (120 mg total) by mouth daily. 30 capsule 6  . diltiazem (CARDIZEM) 30 MG tablet Take 1 tablet every 4 hours AS NEEDED for heart rate >100 as long as blood pressure >100. 45 tablet 3  . dofetilide (TIKOSYN) 250 MCG capsule Take 1 capsule (250 mcg total) by mouth 2 (two) times daily. 60 capsule 6  . lactobacillus acidophilus (BACID) TABS tablet Take 1 tablet by mouth daily.     Marland Kitchen loratadine (CLARITIN) 10 MG tablet Take 10 mg by mouth daily.     . potassium chloride (KLOR-CON 10) 10 MEQ tablet Take one tablet in the morning and two tablets in the pm 270 tablet 2  . psyllium (REGULOID) 0.52 g capsule Take 0.52 g by mouth 2 (two) times daily.     Marland Kitchen apixaban (ELIQUIS) 5 MG TABS tablet Take 1 tablet (5 mg total) by mouth 2 (two) times daily. 90 tablet 1  . atorvastatin (LIPITOR) 20 MG tablet TAKE 1 TABLET BY MOUTH  DAILY AT 6 PM. 90 tablet 2  . bumetanide (BUMEX) 1 MG tablet TAKE ONE (1) TABLET BY MOUTH EVERY DAY 90 tablet 0  . lisinopril (PRINIVIL,ZESTRIL) 10 MG tablet Take 1 tablet (10 mg total) by mouth  daily. 90 tablet 1   No facility-administered medications prior to visit.      Review of Systems  Constitutional: Negative  for fever and malaise/fatigue.  HENT: Negative for congestion.   Eyes: Negative for blurred vision.  Respiratory: Negative for sputum production and shortness of breath.   Cardiovascular: Negative for chest pain, palpitations and leg swelling.  Gastrointestinal: Negative for abdominal pain, blood in stool and nausea.  Genitourinary: Negative for dysuria and frequency.  Musculoskeletal: Positive for neck pain. Negative for falls.  Skin: Negative for rash.  Neurological: Negative for dizziness, loss of consciousness and headaches.  Endo/Heme/Allergies: Negative for environmental allergies.  Psychiatric/Behavioral: Negative for depression. The patient is not nervous/anxious.        Objective:    Physical Exam  Constitutional: She is oriented to person, place, and time. She appears well-developed and well-nourished. No distress.  HENT:  Head: Normocephalic and atraumatic.  Nose: Nose normal.  Eyes: Right eye exhibits no discharge. Left eye exhibits no discharge.  Neck: Normal range of motion. Neck supple.  Cardiovascular: Normal rate and regular rhythm.  No murmur heard. Pulmonary/Chest: Effort normal and breath sounds normal.  Abdominal: Soft. Bowel sounds are normal. There is no tenderness.  Musculoskeletal: She exhibits no edema.  Neurological: She is alert and oriented to person, place, and time.  Skin: Skin is warm and dry.  Healing incision posterior neck, no surroundin fluctuance  Psychiatric: She has a normal mood and affect.  Nursing note and vitals reviewed.   BP 120/78   Pulse 71   Temp 98.1 F (36.7 C) (Oral)   Resp 18   Wt 271 lb (122.9 kg)   SpO2 96%   BMI 48.01 kg/m  Wt Readings from Last 3 Encounters:  05/26/18 271 lb (122.9 kg)  05/11/18 270 lb 9.6 oz (122.7 kg)  04/21/18 272 lb 3.2 oz (123.5 kg)     Lab Results  Component Value Date   WBC 7.3 04/05/2018   HGB 13.0 04/05/2018   HCT 37.8 04/05/2018   PLT 263.0 04/05/2018   GLUCOSE 106 (H)  05/11/2018   CHOL 152 04/05/2018   TRIG 151.0 (H) 04/05/2018   HDL 45.30 04/05/2018   LDLCALC 77 04/05/2018   ALT 9 04/05/2018   AST 13 04/05/2018   NA 138 05/11/2018   K 4.2 05/11/2018   CL 100 05/11/2018   CREATININE 1.05 (H) 05/11/2018   BUN 14 05/11/2018   CO2 28 05/11/2018   TSH 2.06 04/05/2018   INR 1.55 (H) 08/06/2015   HGBA1C 5.9 04/05/2018    Lab Results  Component Value Date   TSH 2.06 04/05/2018   Lab Results  Component Value Date   WBC 7.3 04/05/2018   HGB 13.0 04/05/2018   HCT 37.8 04/05/2018   MCV 93.7 04/05/2018   PLT 263.0 04/05/2018   Lab Results  Component Value Date   NA 138 05/11/2018   K 4.2 05/11/2018   CO2 28 05/11/2018   GLUCOSE 106 (H) 05/11/2018   BUN 14 05/11/2018   CREATININE 1.05 (H) 05/11/2018   BILITOT 0.7 04/05/2018   ALKPHOS 83 04/05/2018   AST 13 04/05/2018   ALT 9 04/05/2018   PROT 6.9 04/05/2018   ALBUMIN 4.4 04/05/2018   CALCIUM 9.5 05/11/2018   ANIONGAP 10 05/11/2018   GFR 55.77 (L) 04/05/2018   Lab Results  Component Value Date   CHOL 152 04/05/2018   Lab Results  Component Value Date   HDL 45.30 04/05/2018   Lab Results  Component Value Date   LDLCALC 77 04/05/2018   Lab Results  Component Value Date   TRIG 151.0 (H) 04/05/2018   Lab Results  Component Value Date   CHOLHDL 3 04/05/2018   Lab Results  Component Value Date   HGBA1C 5.9 04/05/2018       Assessment & Plan:   Problem List Items Addressed This Visit    Atrial fibrillation ([place])    Tolerating Eliquis and rate controlled. Follows with cardiology      Relevant Medications   lisinopril (PRINIVIL,ZESTRIL) 10 MG tablet   atorvastatin (LIPITOR) 20 MG tablet   bumetanide (BUMEX) 1 MG tablet   Essential hypertension    Well controlled, no changes to meds. Encouraged heart healthy diet such as the DASH diet and exercise as tolerated.       Relevant Medications   lisinopril (PRINIVIL,ZESTRIL) 10 MG tablet   atorvastatin (LIPITOR) 20 MG  tablet   bumetanide (BUMEX) 1 MG tablet   Hyperlipidemia, mixed    Tolerating statin, encouraged heart healthy diet, avoid trans fats, minimize simple carbs and saturated fats. Increase exercise as tolerated      Relevant Medications   lisinopril (PRINIVIL,ZESTRIL) 10 MG tablet   atorvastatin (LIPITOR) 20 MG tablet   bumetanide (BUMEX) 1 MG tablet   Obesity    Encouraged DASH diet, decrease po intake and increase exercise as tolerated. Needs 7-8 hours of sleep nightly. Avoid trans fats, eat small, frequent meals every 4-5 hours with lean proteins, complex carbs and healthy fats. Minimize simple carbs      Benign paroxysmal positional vertigo    Underwent vestibular rehab and is feeling much better.       Hyperglycemia    hgba1c acceptable, minimize simple carbs. Increase exercise as tolerated.       Abscess    On her posterior neck it has been excised and she is feeling better at this time.          I have discontinued Tracy Miles "Betty"'s apixaban. I am also having her maintain her calcium carbonate, loratadine, psyllium, acetaminophen, diltiazem, lactobacillus acidophilus, potassium chloride, diltiazem, dofetilide, lisinopril, atorvastatin, and bumetanide.  Meds ordered this encounter  Medications  . lisinopril (PRINIVIL,ZESTRIL) 10 MG tablet    Sig: Take 1 tablet (10 mg total) by mouth daily.    Dispense:  90 tablet    Refill:  1  . DISCONTD: apixaban (ELIQUIS) 5 MG TABS tablet    Sig: Take 1 tablet (5 mg total) by mouth 2 (two) times daily.    Dispense:  90 tablet    Refill:  1  . atorvastatin (LIPITOR) 20 MG tablet    Sig: TAKE 1 TABLET BY MOUTH  DAILY AT 6 PM.    Dispense:  90 tablet    Refill:  2  . bumetanide (BUMEX) 1 MG tablet    Sig: TAKE ONE (1) TABLET BY MOUTH EVERY DAY    Dispense:  90 tablet    Refill:  1     [name], MD

## 2018-05-29 NOTE — Assessment & Plan Note (Signed)
Underwent vestibular rehab and is feeling much better.

## 2018-05-29 NOTE — Assessment & Plan Note (Signed)
Tolerating statin, encouraged heart healthy diet, avoid trans fats, minimize simple carbs and saturated fats. Increase exercise as tolerated 

## 2018-05-29 NOTE — Assessment & Plan Note (Signed)
hgba1c acceptable, minimize simple carbs. Increase exercise as tolerated.  

## 2018-05-29 NOTE — Assessment & Plan Note (Signed)
Tolerating Eliquis and rate controlled. Follows with cardiology

## 2018-05-29 NOTE — Assessment & Plan Note (Signed)
Encouraged DASH diet, decrease po intake and increase exercise as tolerated. Needs 7-8 hours of sleep nightly. Avoid trans fats, eat small, frequent meals every 4-5 hours with lean proteins, complex carbs and healthy fats. Minimize simple carbs 

## 2018-05-29 NOTE — Assessment & Plan Note (Signed)
Well controlled, no changes to meds. Encouraged heart healthy diet such as the DASH diet and exercise as tolerated.  °

## 2018-07-07 ENCOUNTER — Other Ambulatory Visit (HOSPITAL_COMMUNITY): Payer: Self-pay | Admitting: Nurse Practitioner

## 2018-07-21 ENCOUNTER — Ambulatory Visit: Payer: Medicare Other | Admitting: Family Medicine

## 2018-08-02 DIAGNOSIS — H26493 Other secondary cataract, bilateral: Secondary | ICD-10-CM | POA: Diagnosis not present

## 2018-08-10 ENCOUNTER — Ambulatory Visit (HOSPITAL_COMMUNITY)
Admission: RE | Admit: 2018-08-10 | Discharge: 2018-08-10 | Disposition: A | Discharge status: Home / Self Care | Payer: Medicare Other | Source: Ambulatory Visit | Attending: Nurse Practitioner | Admitting: Nurse Practitioner

## 2018-08-10 ENCOUNTER — Encounter (HOSPITAL_COMMUNITY): Payer: Self-pay | Admitting: Nurse Practitioner

## 2018-08-10 VITALS — BP 130/70 | HR 80 | Ht 63.0 in | Wt 271.0 lb

## 2018-08-10 DIAGNOSIS — I10 Essential (primary) hypertension: Secondary | ICD-10-CM | POA: Insufficient documentation

## 2018-08-10 DIAGNOSIS — Z888 Allergy status to other drugs, medicaments and biological substances status: Secondary | ICD-10-CM | POA: Diagnosis not present

## 2018-08-10 DIAGNOSIS — Z7901 Long term (current) use of anticoagulants: Secondary | ICD-10-CM | POA: Diagnosis not present

## 2018-08-10 DIAGNOSIS — E669 Obesity, unspecified: Secondary | ICD-10-CM | POA: Insufficient documentation

## 2018-08-10 DIAGNOSIS — Z86718 Personal history of other venous thrombosis and embolism: Secondary | ICD-10-CM | POA: Diagnosis not present

## 2018-08-10 DIAGNOSIS — E7800 Pure hypercholesterolemia, unspecified: Secondary | ICD-10-CM | POA: Diagnosis not present

## 2018-08-10 DIAGNOSIS — M1990 Unspecified osteoarthritis, unspecified site: Secondary | ICD-10-CM | POA: Diagnosis not present

## 2018-08-10 DIAGNOSIS — Z8249 Family history of ischemic heart disease and other diseases of the circulatory system: Secondary | ICD-10-CM | POA: Insufficient documentation

## 2018-08-10 DIAGNOSIS — R42 Dizziness and giddiness: Secondary | ICD-10-CM | POA: Diagnosis not present

## 2018-08-10 DIAGNOSIS — I350 Nonrheumatic aortic (valve) stenosis: Secondary | ICD-10-CM | POA: Diagnosis not present

## 2018-08-10 DIAGNOSIS — G4733 Obstructive sleep apnea (adult) (pediatric): Secondary | ICD-10-CM | POA: Diagnosis not present

## 2018-08-10 DIAGNOSIS — E785 Hyperlipidemia, unspecified: Secondary | ICD-10-CM | POA: Insufficient documentation

## 2018-08-10 DIAGNOSIS — Z90710 Acquired absence of both cervix and uterus: Secondary | ICD-10-CM | POA: Insufficient documentation

## 2018-08-10 DIAGNOSIS — I2510 Atherosclerotic heart disease of native coronary artery without angina pectoris: Secondary | ICD-10-CM | POA: Insufficient documentation

## 2018-08-10 DIAGNOSIS — I480 Paroxysmal atrial fibrillation: Secondary | ICD-10-CM | POA: Diagnosis not present

## 2018-08-10 DIAGNOSIS — Z79899 Other long term (current) drug therapy: Secondary | ICD-10-CM | POA: Insufficient documentation

## 2018-08-10 DIAGNOSIS — Z880 Allergy status to penicillin: Secondary | ICD-10-CM | POA: Diagnosis not present

## 2018-08-10 LAB — BASIC METABOLIC PANEL
ANION GAP: 10 (ref 5–15)
BUN: 22 mg/dL (ref 8–23)
CALCIUM: 9.6 mg/dL (ref 8.9–10.3)
CO2: 30 mmol/L (ref 22–32)
CREATININE: 1.11 mg/dL — AB (ref 0.44–1.00)
Chloride: 99 mmol/L (ref 98–111)
GFR calc Af Amer: 54 mL/min — ABNORMAL LOW (ref >60)
GFR calc non Af Amer: 46 mL/min — ABNORMAL LOW (ref >60)
Glucose, Bld: 102 mg/dL — ABNORMAL HIGH (ref 70–99)
Potassium: 3.9 mmol/L (ref 3.5–5.1)
Sodium: 139 mmol/L (ref 135–145)

## 2018-08-10 LAB — MAGNESIUM: Magnesium: 2.2 mg/dL (ref 1.7–2.4)

## 2018-08-10 NOTE — Progress Notes (Signed)
.aPatient ID: Tracy Miles, female   DOB: 1934-01-30, 84 y.o.   MRN: [id number]     Primary Care Physician: [name], MD Referring Physician: Dr. Lillette Boxer Neff is a 84 y.o. female with a h/o paroxysmal afib  on tikosyn for f/u. She reports no occurence of afib.  Continues on Eliquis at 5 mg bid. Mentions that she has noted some lightheadedness mostly in the am and feeling slightly off balance, no falls, noted around 2 weeks ago.Nothing  has really changed in her health. Is still using cpap. No issues with Eliquis.  F/u in afib clinic for surveillance of Tikosyn. She is maintaining  SR. When I saw her last she was c/o of lightheadedness. Carotid dopplers were ordered and clear of significant obstruction. She was then found to have an abscess st the base of her posterior neck which was excised but continues to  drain. She may have to have further surgery to resolve.she underwent Epley  Maneuvers for vertigo.  F/u in afib clinic 12/11 for Tikosyn surveillance. She reports no afib. Overall doing well.  Today, she denies symptoms of palpitations, chest pain, shortness of breath, orthopnea, PND, lower extremity edema, dizziness, presyncope, syncope, or neurologic sequela. The patient is tolerating medications without difficulties and is otherwise without complaint today.   Past Medical History  Diagnosis Date  . Hypertension   . Heart murmur   . Coronary artery disease   . Atrial fibrillation ([place])   . Anemia   . Arthritis   . Colon polyp   . Personal history of DVT (deep vein thrombosis) X 2    "left"  . Hyperlipidemia   . Pancreatitis     post hysterectomy  . Sleep apnea   . Aortic stenosis     Very mild  . Hypercholesteremia   . OSA on CPAP   . PAF (paroxysmal atrial fibrillation) ([place])   . History of valvular heart disease     LEFT  . Chicken pox as a child  . Measles as a child  . Mumps child and teenager  . Allergy   . Obesity   . Unspecified  constipation 11/16/2013  . Heme positive stool 11/16/2013  . DDD (degenerative disc disease) 11/16/2013  . Unspecified sleep apnea 11/19/2013  . Allergic state 11/19/2013  . OA (osteoarthritis) 11/19/2013    S/p L TKR  . Benign paroxysmal positional vertigo 12/17/2013  . Abnormal results of thyroid function studies 12/17/2013  . Iron deficiency anemia 11/13/2013  . Personal history of colonic polyps 02/25/2014  . Spinal stenosis   . Obstructive sleep apnea 02/22/2015  . Visit for monitoring Tikosyn therapy 08/11/2015  . Medicare annual wellness visit, subsequent 09/15/2015   Past Surgical History  Procedure Laterality Date  . Tonsilectomy, adenoidectomy, bilateral myringotomy and tubes  child  . Lumbar laminectomy  40 yrs ago    "L3-4"  . Tubal ligation  84 years old  . Total abdominal hysterectomy  1995    had 2 tumors- benign  . Replacement total knee Left 2013  . Cyst removal hand Bilateral 1990's    "played to much golf"  . Meniscus repair Bilateral 2000 and 2010  . Appendectomy    . Tonsillectomy and adenoidectomy    . Joint replacement    . Back surgery    . Cataract extraction w/ intraocular lens  implant, bilateral Bilateral 2016  . Dilation and curettage of uterus    . Cardioversion N/A 08/09/2015    Procedure: CARDIOVERSION;  Surgeon: Jerline Pain, MD;  Location: [facility];  Service: Cardiovascular;  Laterality: N/A;  . Eye surgery  2016    cataracts    Current Outpatient Prescriptions  Medication Sig Dispense Refill  . acetaminophen (TYLENOL) 650 MG CR tablet Take 1,300 mg by mouth every evening.    Marland Kitchen apixaban (ELIQUIS) 5 MG TABS tablet Take 1 tablet (5 mg total) by mouth 2 (two) times daily. 180 tablet 2  . atorvastatin (LIPITOR) 20 MG tablet Take 1 tablet (20 mg total) by mouth daily. 90 tablet 2  . bumetanide (BUMEX) 1 MG tablet TAKE ONE (1) TABLET BY MOUTH EVERY DAY 90 tablet 0  . calcium carbonate (OS-CAL) 600 MG TABS tablet Take 600 mg by mouth 2 (two) times  daily with a meal.    . diltiazem (CARDIZEM CD) 240 MG 24 hr capsule Take 240 mg by mouth daily.    Marland Kitchen dofetilide (TIKOSYN) 250 MCG capsule Take 1 capsule (250 mcg total) by mouth 2 (two) times daily. 60 capsule 3  . Lactobacillus (PROBIOTIC ACIDOPHILUS) TABS Take 1 tablet by mouth daily. Reported on 03/04/2016    . lisinopril (PRINIVIL,ZESTRIL) 10 MG tablet Take 1 tablet (10 mg total) by mouth daily. 90 tablet 2  . loratadine (CLARITIN) 10 MG tablet Take 10 mg by mouth daily.     . potassium chloride (KLOR-CON 10) 10 MEQ tablet TAKE ONE (1) TABLET EACH DAY 90 tablet 2  . psyllium (REGULOID) 0.52 g capsule Take 0.52 g by mouth daily.     No current facility-administered medications for this encounter.    Allergies  Allergen Reactions  . Gabapentin Swelling  . Lactose Intolerance (Gi) Other (See Comments)    Bothers her stomach  . Zebeta [Bisoprolol Fumarate] Nausea Only  . Penicillins Rash    Social History   Social History  . Marital Status: Widowed    Spouse Name: N/A  . Number of Children: 3  . Years of Education: N/A   Occupational History  . retired Pharmacist, hospital    Social History Main Topics  . Smoking status: Never Smoker   . Smokeless tobacco: Never Used     Comment: never used tobacco  . Alcohol Use: No  . Drug Use: No  . Sexual Activity: No     Comment: lives at [place], low sodium diet   Other Topics Concern  . Not on file   Social History Narrative    Family History  Problem Relation Age of Onset  . Heart attack Mother 27  . Hyperlipidemia Mother     ?  Marland Kitchen Dementia Mother   . Pernicious anemia Mother   . COPD Father     smoker  . Cancer Father 94    prostate  . Heart disease Brother     quadruple bipass surgery  . Hyperlipidemia Brother   . Hypertension Brother   . Diabetes Maternal Grandmother     type 2  . Pernicious anemia Maternal Grandmother   . Colon cancer Neg Hx   . Pancreatic cancer Neg Hx   . Stomach cancer Neg Hx   . Throat cancer  Neg Hx   . Liver disease Neg Hx   . Gout Son   . Atrial fibrillation Son   . Hyperlipidemia Son   . Cancer Maternal Grandfather     liver  . Cancer Paternal Grandmother     lung- doesn't think she smokes?  . Stroke Paternal Grandfather   . Cancer Son 28  non hodgin's lymphoma  . Gout Son   . Hyperlipidemia Son   . Sleep apnea Son     ROS- All systems are reviewed and negative except as per the HPI above  Physical Exam: Filed Vitals:   03/04/16 1351  BP: 138/74  Pulse: 75  Height: 5' 3.5" (1.613 m)  Weight: 260 lb 12.8 oz (118.298 kg)    GEN- The patient is well appearing, alert and oriented x 3 today.   Head- normocephalic, atraumatic Eyes-  Sclera clear, conjunctiva pink Ears- hearing intact Oropharynx- clear Neck- supple, no JVP, soft bruit rt carotid Lymph- no cervical lymphadenopathy Lungs- Clear to ausculation bilaterally, normal work of breathing Heart- Regular rate and rhythm, no murmurs, rubs or gallops, PMI not laterally displaced GI- soft, NT, ND, + BS Extremities- no clubbing, cyanosis, or edema MS- no significant deformity or atrophy Skin- no rash or lesion Psych- euthymic mood, full affect Neuro- strength and sensation are intact  EKG- NSR at 80 bpm, Pr int 184 ms, QRS int 74 ms, Qtc 463 ms(qtc is stable) Epic records reviewed   Assessment and Plan: 1. Afib Doing well on dofetilide, continue 250 mg bid, reports no recent afib Continue daily cardizem with 30 mg as needed for bouts of afib Continue apixaban with chadsvasc score of 5 Bmet/mag pending  2. Lightheadedness Dx with vertigo Did well with Epley manuvers   F/u in 6 months  PCP as scheduled  Butch Penny C. Aundra Espin, [facility] [address] [place], [place] [address] [phone]

## 2018-09-06 ENCOUNTER — Ambulatory Visit: Payer: Medicare Other | Admitting: Family Medicine

## 2018-09-06 ENCOUNTER — Telehealth: Payer: Self-pay | Admitting: Family Medicine

## 2018-09-06 MED ORDER — LISINOPRIL 10 MG PO TABS: 10.0000 mg | ORAL_TABLET | Freq: Every day | ORAL | 1 refills | Status: DC

## 2018-09-06 NOTE — Telephone Encounter (Signed)
Medication sent in. 

## 2018-09-06 NOTE — Telephone Encounter (Signed)
Pt is needing refill on lisinopril (PRINIVIL,ZESTRIL) 10 MG tablet, sent to [facility].

## 2018-09-06 NOTE — Telephone Encounter (Signed)
Pt stated is leaving out of town soon and will need the refill.

## 2018-09-07 ENCOUNTER — Encounter: Payer: Self-pay | Admitting: Adult Health

## 2018-09-07 ENCOUNTER — Ambulatory Visit (INDEPENDENT_AMBULATORY_CARE_PROVIDER_SITE_OTHER): Payer: Medicare Other | Admitting: Adult Health

## 2018-09-07 ENCOUNTER — Telehealth (HOSPITAL_COMMUNITY): Payer: Self-pay | Admitting: *Deleted

## 2018-09-07 DIAGNOSIS — G4733 Obstructive sleep apnea (adult) (pediatric): Secondary | ICD-10-CM | POA: Diagnosis not present

## 2018-09-07 DIAGNOSIS — I480 Paroxysmal atrial fibrillation: Secondary | ICD-10-CM

## 2018-09-07 DIAGNOSIS — E6609 Other obesity due to excess calories: Secondary | ICD-10-CM

## 2018-09-07 NOTE — Assessment & Plan Note (Signed)
Excellent control on BIPAP   Plan  Patient Instructions  Great job with BIPAP  Keep up good work .  Work on weight loss Do not drive if sleepy.  Please contact A Fib clinic that you are having break through episodes.  Follow up Dr. Halford Chessman or [place] in 1 year at the [facility]

## 2018-09-07 NOTE — Progress Notes (Signed)
@Patient  ID: Tracy Miles, female    DOB: September 10, 1933, 85 y.o.   MRN: [id number]  Chief Complaint  Patient presents with  . Follow-up    OSA     Referring provider: [name], MD  HPI: 85 year old female followed for obstructive sleep apnea on BiPAP at bedtime  A FIb hx    TEST  PSG 03/07/15 >had trouble sleeping due to back pain (total sleep time was only 243 min ) AHI 10.4./hr (AHI during REM 74.7 /hr ) Low sat 84%. Severe PLMS.    09/07/2018 Follow up : OSA Patient presents for a one-year follow-up for sleep apnea.  Patient remains on BiPAP support at bedtime.  She says she is doing very well.  She feels rested with no significant daytime sleepiness.  Says that she wears her machine every night and does not miss any nights.  Download shows excellent compliance with average usage at 8 hours.  Patient is on IPAP 16, EPAP 12 cm H2O.  AHI 1.3.  Minimum leaks.  Patient does have A. fib and is followed by cardiology.  She remains on Tikosyn, Cardizem and Eliquis.  Visit she has been having trouble with increased episodes of A. Fib. She is taking extra cardizem as directed and Rate is down . We discussed follow up with Cards for this  No chest pain, orthopnea or increased leg swelling .      Immunization History  Administered Date(s) Administered  . Influenza, High Dose Seasonal PF 05/11/2017  . Influenza,inj,Quad PF,6+ Mos 04/30/2016  . Influenza-Unspecified 05/31/2013, 05/01/2014, 06/11/2015  . Pneumococcal Conjugate-13 04/02/2014  . Pneumococcal Polysaccharide-23 08/31/1998, 09/06/2015  . Tdap 07/31/2012    Past Medical History:  Diagnosis Date  . Anemia   . Aortic stenosis    Very mild  . Benign paroxysmal positional vertigo 12/17/2013  . Chicken pox as a child  . Colon polyp   . Coronary artery disease   . DDD (degenerative disc disease) 11/16/2013  . Hyperlipidemia   . Hypertension   . Iron deficiency anemia 11/13/2013  . Mumps child and teenager  . OA  (osteoarthritis) 11/19/2013   S/p L TKR  . Obesity   . OSA on CPAP   . Pancreatitis    post hysterectomy  . Persistent atrial fibrillation   . Personal history of colonic polyps 02/25/2014  . Personal history of DVT (deep vein thrombosis) X 2   "left"  . Sleep apnea   . Spinal stenosis     Tobacco History: Social History   Tobacco Use  Smoking Status Never Smoker  Smokeless Tobacco Never Used  Tobacco Comment   never used tobacco   Counseling given: Not Answered Comment: never used tobacco   Outpatient Medications Prior to Visit  Medication Sig Dispense Refill  . acetaminophen (TYLENOL) 325 MG tablet Take 325 mg by mouth daily.     Marland Kitchen apixaban (ELIQUIS) 5 MG TABS tablet Take 1 tablet (5 mg total) by mouth 2 (two) times daily. 180 tablet 1  . atorvastatin (LIPITOR) 20 MG tablet TAKE 1 TABLET BY MOUTH  DAILY AT 6 PM. 90 tablet 2  . bumetanide (BUMEX) 1 MG tablet TAKE ONE (1) TABLET BY MOUTH EVERY DAY 90 tablet 1  . calcium carbonate (OS-CAL) 600 MG TABS tablet Take 600 mg by mouth 2 (two) times daily with a meal.    . diltiazem (CARDIZEM CD) 120 MG 24 hr capsule Take 1 capsule (120 mg total) by mouth daily. 30 capsule  6  . dofetilide (TIKOSYN) 250 MCG capsule Take 1 capsule (250 mcg total) by mouth 2 (two) times daily. 60 capsule 6  . lactobacillus acidophilus (BACID) TABS tablet Take 1 tablet by mouth daily.     Marland Kitchen lisinopril (PRINIVIL,ZESTRIL) 10 MG tablet Take 1 tablet (10 mg total) by mouth daily. 90 tablet 1  . loratadine (CLARITIN) 10 MG tablet Take 10 mg by mouth daily.     . potassium chloride (K-DUR) 10 MEQ tablet TAKE 1 TABLET BY MOUTH IN  THE MORNING AND 2 TABLETS  IN THE EVENING 270 tablet 2  . psyllium (REGULOID) 0.52 g capsule Take 0.52 g by mouth 2 (two) times daily.     Marland Kitchen diltiazem (CARDIZEM) 30 MG tablet Take 1 tablet every 4 hours AS NEEDED for heart rate >100 as long as blood pressure >100. (Patient not taking: Reported on 08/10/2018) 45 tablet 3   No  facility-administered medications prior to visit.      Review of Systems:   Constitutional:   No  weight loss, night sweats,  Fevers, chills, fatigue, or  lassitude.  HEENT:   No headaches,  Difficulty swallowing,  Tooth/dental problems, or  Sore throat,                No sneezing, itching, ear ache, nasal congestion, post nasal drip,   CV:  No chest pain,  Orthopnea, PND, swelling in lower extremities, anasarca, dizziness, palpitations, syncope.   GI  No heartburn, indigestion, abdominal pain, nausea, vomiting, diarrhea, change in bowel habits, loss of appetite, bloody stools.   Resp: No shortness of breath with exertion or at rest.  No excess mucus, no productive cough,  No non-productive cough,  No coughing up of blood.  No change in color of mucus.  No wheezing.  No chest wall deformity  Skin: no rash or lesions.  GU: no dysuria, change in color of urine, no urgency or frequency.  No flank pain, no hematuria   MS:  No joint pain or swelling.  No decreased range of motion.  No back pain.    Physical Exam  BP 114/81 (BP Location: Left Arm, Patient Position: Sitting, Cuff Size: Large)   Pulse 75   Ht 5\' 3"  (1.6 m)   Wt 273 lb (123.8 kg)   SpO2 99%   BMI 48.36 kg/m   GEN: A/Ox3; pleasant , NAD, obese    HEENT:  [place]/AT,  EACs-clear, TMs-wnl, NOSE-clear, THROAT-clear, no lesions, no postnasal drip or exudate noted.   NECK:  Supple w/ fair ROM; no JVD; normal carotid impulses w/o bruits; no thyromegaly or nodules palpated; no lymphadenopathy.    RESP  Clear  P & A; w/o, wheezes/ rales/ or rhonchi. no accessory muscle use, no dullness to percussion  CARD:  RRR, no m/r/g, no peripheral edema, pulses intact, no cyanosis or clubbing.  GI:   Soft & nt; nml bowel sounds; no organomegaly or masses detected.   Musco: Warm bil, no deformities or joint swelling noted.   Neuro: alert, no focal deficits noted.    Skin: Warm, no lesions or rashes    Lab  Results:  CBC  ProBNP No results found for: PROBNP  Imaging: No results found.    PFT Results Latest Ref Rng & Units 11/15/2013  FVC-Pre L 2.24  FVC-Predicted Pre % 86  FVC-Post L 2.34  FVC-Predicted Post % 90  Pre FEV1/FVC % % 79  Post FEV1/FCV % % 83  FEV1-Pre L 1.78  FEV1-Predicted Pre %  92  FEV1-Post L 1.95  DLCO UNC% % 62  DLCO COR %Predicted % 78  TLC L 4.38  TLC % Predicted % 89  RV % Predicted % 87    No results found for: NITRICOXIDE      Assessment & Plan:   Obstructive sleep apnea Excellent control on BIPAP   Plan  Patient Instructions  Great job with BIPAP  Keep up good work .  Work on weight loss Do not drive if sleepy.  Please contact A Fib clinic that you are having break through episodes.  Follow up Dr. Halford Chessman or Marcin Holte in 1 year at the [facility]        Obesity Wt loss   Atrial fibrillation ([facility]) Increased episodes . No signs of volume overload or instability on exam  Needs follow up with cards  Plan  Patient Instructions  Great job with BIPAP  Keep up good work .  Work on weight loss Do not drive if sleepy.  Please contact A Fib clinic that you are having break through episodes.  Follow up Dr. Halford Chessman or Tiffnay Bossi in 1 year at the [facility] [place], [place] 09/07/2018

## 2018-09-07 NOTE — Telephone Encounter (Signed)
Patient with breakthrough afib HR in the 80-140s. BP 114/72. Currently taking cardizem 30mg  tabs PRN but none since this morning - instructed to take one now as her HR was 109 if tonight before bed her HR is still elevated she will take an extra 120mg  of cardizem tonight and call in the AM.

## 2018-09-07 NOTE — Patient Instructions (Signed)
Great job with BIPAP  Keep up good work .  Work on weight loss Do not drive if sleepy.  Please contact A Fib clinic that you are having break through episodes.  Follow up Dr. Halford Chessman or Milicent Acheampong in 1 year at the [facility]

## 2018-09-07 NOTE — Progress Notes (Signed)
Reviewed and agree with assessment/plan.   Doraine Schexnider, MD [facility] 08/26/2016, 12:24 PM Pager:  [phone]  

## 2018-09-07 NOTE — Assessment & Plan Note (Signed)
Increased episodes . No signs of volume overload or instability on exam  Needs follow up with cards  Plan  Patient Instructions  Great job with BIPAP  Keep up good work .  Work on weight loss Do not drive if sleepy.  Please contact A Fib clinic that you are having break through episodes.  Follow up Dr. Halford Chessman or Emmamarie Kluender in 1 year at the [facility]

## 2018-09-07 NOTE — Assessment & Plan Note (Signed)
Wt loss  

## 2018-09-19 ENCOUNTER — Ambulatory Visit (HOSPITAL_COMMUNITY)
Admission: RE | Admit: 2018-09-19 | Discharge: 2018-09-19 | Disposition: A | Discharge status: Home / Self Care | Payer: Medicare Other | Source: Ambulatory Visit | Attending: Nurse Practitioner | Admitting: Nurse Practitioner

## 2018-09-19 ENCOUNTER — Encounter (HOSPITAL_COMMUNITY): Payer: Self-pay | Admitting: Nurse Practitioner

## 2018-09-19 VITALS — BP 126/76 | HR 134 | Ht 63.0 in | Wt 273.0 lb

## 2018-09-19 DIAGNOSIS — I4891 Unspecified atrial fibrillation: Secondary | ICD-10-CM | POA: Diagnosis not present

## 2018-09-19 DIAGNOSIS — I119 Hypertensive heart disease without heart failure: Secondary | ICD-10-CM | POA: Insufficient documentation

## 2018-09-19 DIAGNOSIS — I350 Nonrheumatic aortic (valve) stenosis: Secondary | ICD-10-CM | POA: Insufficient documentation

## 2018-09-19 DIAGNOSIS — Z8249 Family history of ischemic heart disease and other diseases of the circulatory system: Secondary | ICD-10-CM | POA: Diagnosis not present

## 2018-09-19 DIAGNOSIS — G4733 Obstructive sleep apnea (adult) (pediatric): Secondary | ICD-10-CM | POA: Diagnosis not present

## 2018-09-19 DIAGNOSIS — Z833 Family history of diabetes mellitus: Secondary | ICD-10-CM | POA: Insufficient documentation

## 2018-09-19 DIAGNOSIS — I2510 Atherosclerotic heart disease of native coronary artery without angina pectoris: Secondary | ICD-10-CM | POA: Insufficient documentation

## 2018-09-19 DIAGNOSIS — E785 Hyperlipidemia, unspecified: Secondary | ICD-10-CM | POA: Insufficient documentation

## 2018-09-19 DIAGNOSIS — Z6841 Body Mass Index (BMI) 40.0 and over, adult: Secondary | ICD-10-CM | POA: Insufficient documentation

## 2018-09-19 DIAGNOSIS — Z90710 Acquired absence of both cervix and uterus: Secondary | ICD-10-CM | POA: Diagnosis not present

## 2018-09-19 DIAGNOSIS — E7800 Pure hypercholesterolemia, unspecified: Secondary | ICD-10-CM | POA: Diagnosis not present

## 2018-09-19 DIAGNOSIS — M1990 Unspecified osteoarthritis, unspecified site: Secondary | ICD-10-CM | POA: Diagnosis not present

## 2018-09-19 DIAGNOSIS — E669 Obesity, unspecified: Secondary | ICD-10-CM | POA: Insufficient documentation

## 2018-09-19 DIAGNOSIS — R9431 Abnormal electrocardiogram [ECG] [EKG]: Secondary | ICD-10-CM | POA: Diagnosis not present

## 2018-09-19 DIAGNOSIS — Z888 Allergy status to other drugs, medicaments and biological substances status: Secondary | ICD-10-CM | POA: Diagnosis not present

## 2018-09-19 DIAGNOSIS — Z86718 Personal history of other venous thrombosis and embolism: Secondary | ICD-10-CM | POA: Insufficient documentation

## 2018-09-19 DIAGNOSIS — E739 Lactose intolerance, unspecified: Secondary | ICD-10-CM | POA: Insufficient documentation

## 2018-09-19 DIAGNOSIS — Z79899 Other long term (current) drug therapy: Secondary | ICD-10-CM | POA: Insufficient documentation

## 2018-09-19 DIAGNOSIS — Z7901 Long term (current) use of anticoagulants: Secondary | ICD-10-CM | POA: Diagnosis not present

## 2018-09-19 DIAGNOSIS — Z880 Allergy status to penicillin: Secondary | ICD-10-CM | POA: Insufficient documentation

## 2018-09-19 DIAGNOSIS — I480 Paroxysmal atrial fibrillation: Secondary | ICD-10-CM

## 2018-09-19 LAB — BASIC METABOLIC PANEL
Anion gap: 10 (ref 5–15)
BUN: 15 mg/dL (ref 8–23)
CO2: 27 mmol/L (ref 22–32)
Calcium: 9.3 mg/dL (ref 8.9–10.3)
Chloride: 101 mmol/L (ref 98–111)
Creatinine, Ser: 1.15 mg/dL — ABNORMAL HIGH (ref 0.44–1.00)
GFR calc Af Amer: 51 mL/min — ABNORMAL LOW (ref >60)
GFR, EST NON AFRICAN AMERICAN: 44 mL/min — AB (ref >60)
Glucose, Bld: 107 mg/dL — ABNORMAL HIGH (ref 70–99)
Potassium: 4.1 mmol/L (ref 3.5–5.1)
Sodium: 138 mmol/L (ref 135–145)

## 2018-09-19 LAB — MAGNESIUM: Magnesium: 2.1 mg/dL (ref 1.7–2.4)

## 2018-09-19 MED ORDER — DILTIAZEM HCL ER COATED BEADS 120 MG PO CP24: 120.0000 mg | ORAL_CAPSULE | Freq: Two times a day (BID) | ORAL | 6 refills | Status: DC

## 2018-09-19 MED ORDER — DILTIAZEM HCL 30 MG PO TABS: 30.0000 mg | ORAL_TABLET | ORAL | 2 refills | Status: DC

## 2018-09-19 NOTE — Patient Instructions (Signed)
Increase cardizem to 120mg twice a day ?

## 2018-09-19 NOTE — Progress Notes (Signed)
.aPatient ID: Tracy Miles, female   DOB: [birth year], 85 y.o.   MRN: [id number]     Primary Care Physician: [name], MD Referring Physician: Dr. Lillette Boxer Betker is a 85 y.o. female with a h/o paroxysmal afib  on tikosyn for f/u. She reports no occurence of afib.  Continues on Eliquis at 5 mg bid. Mentions that she has noted some lightheadedness mostly in the am and feeling slightly off balance, no falls, noted around 2 weeks ago.Nothing  has really changed in her health. Is still using cpap. No issues with Eliquis.  F/u in afib clinic for surveillance of Tikosyn. She is maintaining  SR. When I saw her last she was c/o of lightheadedness. Carotid dopplers were ordered and clear of significant obstruction. She was then found to have an abscess st the base of her posterior neck which was excised but continues to  drain. She may have to have further surgery to resolve.she underwent Epley  Maneuvers for vertigo.  F/u in afib clinic 08/10/18 for Tikosyn surveillance. She reports no afib. Overall doing well.  F/u urgently in the afib clinic, 1/20, for afib present since 1/7. She feels that she may be paroxysmal but she can not be sure. Has not missed nay of her eliquis for the last 3 weeks.Taking dofetilide without change.  She has been under more stress as her son has been diagnosed with cancer.Ekg shows afib at 125 bpm. She has tried some 30 mg Cardizem but it  had not seemed to work and she noted that they were out of date x one year.   Today, she denies symptoms of palpitations, chest pain, shortness of breath, orthopnea, PND, lower extremity edema, dizziness, presyncope, syncope, or neurologic sequela. + for palpitations and fatigue. The patient is tolerating medications without difficulties and is otherwise without complaint today.   Past Medical History  Diagnosis Date  . Hypertension   . Heart murmur   . Coronary artery disease   . Atrial fibrillation ([place])   . Anemia   .  Arthritis   . Miles polyp   . Personal history of DVT (deep vein thrombosis) X 2    "left"  . Hyperlipidemia   . Pancreatitis     post hysterectomy  . Sleep apnea   . Aortic stenosis     Very mild  . Hypercholesteremia   . OSA on CPAP   . PAF (paroxysmal atrial fibrillation) ([place])   . History of valvular heart disease     LEFT  . Chicken pox as a child  . Measles as a child  . Mumps child and teenager  . Allergy   . Obesity   . Unspecified constipation 11/16/2013  . Heme positive stool 11/16/2013  . DDD (degenerative disc disease) 11/16/2013  . Unspecified sleep apnea 11/19/2013  . Allergic state 11/19/2013  . OA (osteoarthritis) 11/19/2013    S/p L TKR  . Benign paroxysmal positional vertigo 12/17/2013  . Abnormal results of thyroid function studies 12/17/2013  . Iron deficiency anemia 11/13/2013  . Personal history of colonic polyps 02/25/2014  . Spinal stenosis   . Obstructive sleep apnea 02/22/2015  . Visit for monitoring Tikosyn therapy 08/11/2015  . Medicare annual wellness visit, subsequent 09/15/2015   Past Surgical History  Procedure Laterality Date  . Tonsilectomy, adenoidectomy, bilateral myringotomy and tubes  child  . Lumbar laminectomy  40 yrs ago    "L3-4"  . Tubal ligation  85 years old  . Total abdominal  hysterectomy  1995    had 2 tumors- benign  . Replacement total knee Left 2013  . Cyst removal hand Bilateral 1990's    "played to much golf"  . Meniscus repair Bilateral 2000 and 2010  . Appendectomy    . Tonsillectomy and adenoidectomy    . Joint replacement    . Back surgery    . Cataract extraction w/ intraocular lens  implant, bilateral Bilateral 2016  . Dilation and curettage of uterus    . Cardioversion N/A 08/09/2015    Procedure: CARDIOVERSION;  Surgeon: Jerline Pain, MD;  Location: [facility];  Service: Cardiovascular;  Laterality: N/A;  . Eye surgery  2016    cataracts    Current Outpatient Prescriptions  Medication Sig Dispense Refill    . acetaminophen (TYLENOL) 650 MG CR tablet Take 1,300 mg by mouth every evening.    Marland Kitchen apixaban (ELIQUIS) 5 MG TABS tablet Take 1 tablet (5 mg total) by mouth 2 (two) times daily. 180 tablet 2  . atorvastatin (LIPITOR) 20 MG tablet Take 1 tablet (20 mg total) by mouth daily. 90 tablet 2  . bumetanide (BUMEX) 1 MG tablet TAKE ONE (1) TABLET BY MOUTH EVERY DAY 90 tablet 0  . calcium carbonate (OS-CAL) 600 MG TABS tablet Take 600 mg by mouth 2 (two) times daily with a meal.    . diltiazem (CARDIZEM CD) 240 MG 24 hr capsule Take 240 mg by mouth daily.    Marland Kitchen dofetilide (TIKOSYN) 250 MCG capsule Take 1 capsule (250 mcg total) by mouth 2 (two) times daily. 60 capsule 3  . Lactobacillus (PROBIOTIC ACIDOPHILUS) TABS Take 1 tablet by mouth daily. Reported on 03/04/2016    . lisinopril (PRINIVIL,ZESTRIL) 10 MG tablet Take 1 tablet (10 mg total) by mouth daily. 90 tablet 2  . loratadine (CLARITIN) 10 MG tablet Take 10 mg by mouth daily.     . potassium chloride (KLOR-CON 10) 10 MEQ tablet TAKE ONE (1) TABLET EACH DAY 90 tablet 2  . psyllium (REGULOID) 0.52 g capsule Take 0.52 g by mouth daily.     No current facility-administered medications for this encounter.    Allergies  Allergen Reactions  . Gabapentin Swelling  . Lactose Intolerance (Gi) Other (See Comments)    Bothers her stomach  . Zebeta [Bisoprolol Fumarate] Nausea Only  . Penicillins Rash    Social History   Social History  . Marital Status: Widowed    Spouse Name: N/A  . Number of Children: 3  . Years of Education: N/A   Occupational History  . retired Pharmacist, hospital    Social History Main Topics  . Smoking status: Never Smoker   . Smokeless tobacco: Never Used     Comment: never used tobacco  . Alcohol Use: No  . Drug Use: No  . Sexual Activity: No     Comment: lives [place], low sodium diet   Other Topics Concern  . Not on file   Social History Narrative    Family History  Problem Relation Age of Onset  . Heart  attack Mother 28  . Hyperlipidemia Mother     ?  Marland Kitchen Dementia Mother   . Pernicious anemia Mother   . COPD Father     smoker  . Cancer Father 58    prostate  . Heart disease Brother     quadruple bipass surgery  . Hyperlipidemia Brother   . Hypertension Brother   . Diabetes Maternal Grandmother  type 2  . Pernicious anemia Maternal Grandmother   . Miles cancer Neg Hx   . Pancreatic cancer Neg Hx   . Stomach cancer Neg Hx   . Throat cancer Neg Hx   . Liver disease Neg Hx   . Gout Son   . Atrial fibrillation Son   . Hyperlipidemia Son   . Cancer Maternal Grandfather     liver  . Cancer Paternal Grandmother     lung- doesn't think she smokes?  . Stroke Paternal Grandfather   . Cancer Son 50    non hodgin's lymphoma  . Gout Son   . Hyperlipidemia Son   . Sleep apnea Son     ROS- All systems are reviewed and negative except as per the HPI above  Physical Exam: Filed Vitals:   03/04/16 1351  BP: 138/74  Pulse: 75  Height: 5' 3.5" (1.613 m)  Weight: 260 lb 12.8 oz (118.298 kg)    GEN- The patient is well appearing, alert and oriented x 3 today.   Head- normocephalic, atraumatic Eyes-  Sclera clear, conjunctiva pink Ears- hearing intact Oropharynx- clear Neck- supple, no JVP, soft bruit rt carotid Lymph- no cervical lymphadenopathy Lungs- Clear to ausculation bilaterally, normal work of breathing Heart- irregular rate and rhythm, no murmurs, rubs or gallops, PMI not laterally displaced GI- soft, NT, ND, + BS Extremities- no clubbing, cyanosis, or edema MS- no significant deformity or atrophy Skin- no rash or lesion Psych- euthymic mood, full affect Neuro- strength and sensation are intact  EKG-  afib at 134 bpm,qrs int 66 ms, qtc 519 ms  Epic records reviewed   Assessment and Plan: 1. Afib Present since 1/7 Pt is not sure of paroxysmal or persistence  Will place a 1 week zio patch Continue dofetilide 250 mg bid  Increase daily Cardizem from qd 120 mg  to 120 mg bid Renew rx for 30 mg cardizem to be used as needed for RVR Continue apixaban with chadsvasc score of 5, states no missed doses Bmet/mag pending  F/u in 7-10 days   Butch Penny C. [facility] [address] [place], [place] [address] [place] Tracy Miles, [facility] [address] [place], [place] [address] [phone]

## 2018-09-29 ENCOUNTER — Encounter (HOSPITAL_COMMUNITY): Payer: Self-pay | Admitting: Nurse Practitioner

## 2018-09-29 ENCOUNTER — Ambulatory Visit (HOSPITAL_COMMUNITY)
Admission: RE | Admit: 2018-09-29 | Discharge: 2018-09-29 | Disposition: A | Discharge status: Home / Self Care | Payer: Medicare Other | Source: Ambulatory Visit | Attending: Nurse Practitioner | Admitting: Nurse Practitioner

## 2018-09-29 VITALS — BP 126/78 | HR 142 | Ht 63.0 in | Wt 274.0 lb

## 2018-09-29 DIAGNOSIS — I10 Essential (primary) hypertension: Secondary | ICD-10-CM | POA: Insufficient documentation

## 2018-09-29 DIAGNOSIS — Z8249 Family history of ischemic heart disease and other diseases of the circulatory system: Secondary | ICD-10-CM | POA: Insufficient documentation

## 2018-09-29 DIAGNOSIS — E7800 Pure hypercholesterolemia, unspecified: Secondary | ICD-10-CM | POA: Insufficient documentation

## 2018-09-29 DIAGNOSIS — Z86718 Personal history of other venous thrombosis and embolism: Secondary | ICD-10-CM | POA: Insufficient documentation

## 2018-09-29 DIAGNOSIS — G4733 Obstructive sleep apnea (adult) (pediatric): Secondary | ICD-10-CM | POA: Insufficient documentation

## 2018-09-29 DIAGNOSIS — M1990 Unspecified osteoarthritis, unspecified site: Secondary | ICD-10-CM | POA: Diagnosis not present

## 2018-09-29 DIAGNOSIS — I4819 Other persistent atrial fibrillation: Secondary | ICD-10-CM | POA: Diagnosis not present

## 2018-09-29 DIAGNOSIS — Z7901 Long term (current) use of anticoagulants: Secondary | ICD-10-CM | POA: Diagnosis not present

## 2018-09-29 DIAGNOSIS — I350 Nonrheumatic aortic (valve) stenosis: Secondary | ICD-10-CM | POA: Insufficient documentation

## 2018-09-29 DIAGNOSIS — I2510 Atherosclerotic heart disease of native coronary artery without angina pectoris: Secondary | ICD-10-CM | POA: Insufficient documentation

## 2018-09-29 DIAGNOSIS — Z79899 Other long term (current) drug therapy: Secondary | ICD-10-CM | POA: Diagnosis not present

## 2018-09-29 DIAGNOSIS — E785 Hyperlipidemia, unspecified: Secondary | ICD-10-CM | POA: Insufficient documentation

## 2018-09-29 DIAGNOSIS — I480 Paroxysmal atrial fibrillation: Secondary | ICD-10-CM | POA: Insufficient documentation

## 2018-09-29 LAB — CBC
HCT: 39.5 % (ref 36.0–46.0)
Hemoglobin: 12.8 g/dL (ref 12.0–15.0)
MCH: 31.2 pg (ref 26.0–34.0)
MCHC: 32.4 g/dL (ref 30.0–36.0)
MCV: 96.3 fL (ref 80.0–100.0)
Platelets: 278 10*3/uL (ref 150–400)
RBC: 4.1 MIL/uL (ref 3.87–5.11)
RDW: 13.1 % (ref 11.5–15.5)
WBC: 6.6 10*3/uL (ref 4.0–10.5)
nRBC: 0 % (ref 0.0–0.2)

## 2018-09-29 LAB — BASIC METABOLIC PANEL
Anion gap: 12 (ref 5–15)
BUN: 17 mg/dL (ref 8–23)
CHLORIDE: 101 mmol/L (ref 98–111)
CO2: 26 mmol/L (ref 22–32)
Calcium: 9.3 mg/dL (ref 8.9–10.3)
Creatinine, Ser: 1.15 mg/dL — ABNORMAL HIGH (ref 0.44–1.00)
GFR calc Af Amer: 51 mL/min — ABNORMAL LOW (ref >60)
GFR calc non Af Amer: 44 mL/min — ABNORMAL LOW (ref >60)
Glucose, Bld: 111 mg/dL — ABNORMAL HIGH (ref 70–99)
Potassium: 4 mmol/L (ref 3.5–5.1)
SODIUM: 139 mmol/L (ref 135–145)

## 2018-09-29 MED ORDER — DILTIAZEM HCL ER COATED BEADS 120 MG PO CP24: 120.0000 mg | ORAL_CAPSULE | Freq: Every day | ORAL | 6 refills | Status: DC

## 2018-09-29 NOTE — Progress Notes (Signed)
.aPatient ID: Tracy Miles, female   DOB: [birth year], 85 y.o.   MRN: [id number]     Primary Care Physician: Tracy Homans, MD Referring Physician: Dr. Lillette Boxer Miles is a 85 y.o. female with a h/o paroxysmal afib  on tikosyn for f/u. She reports no occurence of afib.  Continues on Eliquis at 5 mg bid. Mentions that she has noted some lightheadedness mostly in the am and feeling slightly off balance, no falls, noted around 2 weeks ago.Nothing  has really changed in her health. Is still using cpap. No issues with Eliquis.  F/u in afib clinic for surveillance of Tikosyn. She is maintaining  SR. When I saw her last she was c/o of lightheadedness. Carotid dopplers were ordered and clear of significant obstruction. She was then found to have an abscess st the base of her posterior neck which was excised but continues to  drain. She may have to have further surgery to resolve.she underwent Epley  Maneuvers for vertigo.  F/u in afib clinic 08/10/18 for Tikosyn surveillance. She reports no afib. Overall doing well.  F/u urgently in the afib clinic, 1/20, for afib present since 1/7. She feels that she may be paroxysmal but she can not be sure. Has not missed nay of her eliquis for the last 3 weeks.Taking dofetilide without change.  She has been under more stress as her son has been diagnosed with cancer.Ekg shows afib at 125 bpm. She has tried some 30 mg Cardizem but it  had not seemed to work and she noted that they were out of date x one year.   Today, she denies symptoms of palpitations, chest pain, shortness of breath, orthopnea, PND, lower extremity edema, dizziness, presyncope, syncope, or neurologic sequela. + for palpitations and fatigue. The patient is tolerating medications without difficulties and is otherwise without complaint today.   Past Medical History  Diagnosis Date  . Hypertension   . Heart murmur   . Coronary artery disease   . Atrial fibrillation ([place])   . Anemia   .  Arthritis   . Colon polyp   . Personal history of DVT (deep vein thrombosis) X 2    "left"  . Hyperlipidemia   . Pancreatitis     post hysterectomy  . Sleep apnea   . Aortic stenosis     Very mild  . Hypercholesteremia   . OSA on CPAP   . PAF (paroxysmal atrial fibrillation) ([place])   . History of valvular heart disease     LEFT  . Chicken pox as a child  . Measles as a child  . Mumps child and teenager  . Allergy   . Obesity   . Unspecified constipation 11/16/2013  . Heme positive stool 11/16/2013  . DDD (degenerative disc disease) 11/16/2013  . Unspecified sleep apnea 11/19/2013  . Allergic state 11/19/2013  . OA (osteoarthritis) 11/19/2013    S/p L TKR  . Benign paroxysmal positional vertigo 12/17/2013  . Abnormal results of thyroid function studies 12/17/2013  . Iron deficiency anemia 11/13/2013  . Personal history of colonic polyps 02/25/2014  . Spinal stenosis   . Obstructive sleep apnea 02/22/2015  . Visit for monitoring Tikosyn therapy 08/11/2015  . Medicare annual wellness visit, subsequent 09/15/2015   Past Surgical History  Procedure Laterality Date  . Tonsilectomy, adenoidectomy, bilateral myringotomy and tubes  child  . Lumbar laminectomy  40 yrs ago    "L3-4"  . Tubal ligation  85 years old  . Total abdominal  hysterectomy  1995    had 2 tumors- benign  . Replacement total knee Left 2013  . Cyst removal hand Bilateral 1990's    "played to much golf"  . Meniscus repair Bilateral 2000 and 2010  . Appendectomy    . Tonsillectomy and adenoidectomy    . Joint replacement    . Back surgery    . Cataract extraction w/ intraocular lens  implant, bilateral Bilateral 2016  . Dilation and curettage of uterus    . Cardioversion N/A 08/09/2015    Procedure: CARDIOVERSION;  Surgeon: Jerline Pain, MD;  Location: [facility];  Service: Cardiovascular;  Laterality: N/A;  . Eye surgery  2016    cataracts    Current Outpatient Prescriptions  Medication Sig Dispense Refill    . acetaminophen (TYLENOL) 650 MG CR tablet Take 1,300 mg by mouth every evening.    Marland Kitchen apixaban (ELIQUIS) 5 MG TABS tablet Take 1 tablet (5 mg total) by mouth 2 (two) times daily. 180 tablet 2  . atorvastatin (LIPITOR) 20 MG tablet Take 1 tablet (20 mg total) by mouth daily. 90 tablet 2  . bumetanide (BUMEX) 1 MG tablet TAKE ONE (1) TABLET BY MOUTH EVERY DAY 90 tablet 0  . calcium carbonate (OS-CAL) 600 MG TABS tablet Take 600 mg by mouth 2 (two) times daily with a meal.    . diltiazem (CARDIZEM CD) 240 MG 24 hr capsule Take 240 mg by mouth daily.    Marland Kitchen dofetilide (TIKOSYN) 250 MCG capsule Take 1 capsule (250 mcg total) by mouth 2 (two) times daily. 60 capsule 3  . Lactobacillus (PROBIOTIC ACIDOPHILUS) TABS Take 1 tablet by mouth daily. Reported on 03/04/2016    . lisinopril (PRINIVIL,ZESTRIL) 10 MG tablet Take 1 tablet (10 mg total) by mouth daily. 90 tablet 2  . loratadine (CLARITIN) 10 MG tablet Take 10 mg by mouth daily.     . potassium chloride (KLOR-CON 10) 10 MEQ tablet TAKE ONE (1) TABLET EACH DAY 90 tablet 2  . psyllium (REGULOID) 0.52 g capsule Take 0.52 g by mouth daily.     No current facility-administered medications for this encounter.    Allergies  Allergen Reactions  . Gabapentin Swelling  . Lactose Intolerance (Gi) Other (See Comments)    Bothers her stomach  . Zebeta [Bisoprolol Fumarate] Nausea Only  . Penicillins Rash    Social History   Social History  . Marital Status: Widowed    Spouse Name: N/A  . Number of Children: 3  . Years of Education: N/A   Occupational History  . retired Pharmacist, hospital    Social History Main Topics  . Smoking status: Never Smoker   . Smokeless tobacco: Never Used     Comment: never used tobacco  . Alcohol Use: No  . Drug Use: No  . Sexual Activity: No     Comment: [place], low sodium diet   Other Topics Concern  . Not on file   Social History Narrative    Family History  Problem Relation Age of Onset  . Heart  attack Mother 7  . Hyperlipidemia Mother     ?  Marland Kitchen Dementia Mother   . Pernicious anemia Mother   . COPD Father     smoker  . Cancer Father 6    prostate  . Heart disease Brother     quadruple bipass surgery  . Hyperlipidemia Brother   . Hypertension Brother   . Diabetes Maternal Grandmother  type 2  . Pernicious anemia Maternal Grandmother   . Colon cancer Neg Hx   . Pancreatic cancer Neg Hx   . Stomach cancer Neg Hx   . Throat cancer Neg Hx   . Liver disease Neg Hx   . Gout Son   . Atrial fibrillation Son   . Hyperlipidemia Son   . Cancer Maternal Grandfather     liver  . Cancer Paternal Grandmother     lung- doesn't think she smokes?  . Stroke Paternal Grandfather   . Cancer Son 50    non hodgin's lymphoma  . Gout Son   . Hyperlipidemia Son   . Sleep apnea Son     ROS- All systems are reviewed and negative except as per the HPI above     GEN- The patient is well appearing, alert and oriented x 3 today.   Head- normocephalic, atraumatic Eyes-  Sclera clear, conjunctiva pink Ears- hearing intact Oropharynx- clear Neck- supple, no JVP, soft bruit rt carotid Lymph- no cervical lymphadenopathy Lungs- Clear to ausculation bilaterally, normal work of breathing Heart- irregular rate and rhythm, no murmurs, rubs or gallops, PMI not laterally displaced GI- soft, NT, ND, + BS Extremities- no clubbing, cyanosis, or edema MS- no significant deformity or atrophy Skin- no rash or lesion Psych- euthymic mood, full affect Neuro- strength and sensation are intact  EKG-  afib at 134 bpm,qrs int 66 ms, qtc 519 ms  Epic records reviewed   Assessment and Plan: 1. Afib Present since 1/7 Pt is not sure of paroxysmal or persistence  Will place a 1 week zio patch Continue dofetilide 250 mg bid  Increase daily Cardizem from qd 120 mg to 120 mg bid Renew rx for 30 mg cardizem to be used as needed for RVR Continue apixaban with chadsvasc score of 5, states no missed  doses Bmet/mag pending  F/u in 7-10 days   Butch Penny C. [place], [facility] [address] [place], [place] [address] [phone]

## 2018-09-29 NOTE — H&P (View-Only) (Signed)
.aPatient ID: Tracy Miles, female   DOB: 10/05/1933, 85 y.o.   MRN: [id number]     Primary Care Physician: [name], MD Referring Physician: Dr. Lillette Boxer Sheard is a 85 y.o. female with a h/o paroxysmal afib  on tikosyn for f/u. She reports no occurence of afib.  Continues on Eliquis at 5 mg bid. Mentions that she has noted some lightheadedness mostly in the am and feeling slightly off balance, no falls, noted around 2 weeks ago.Nothing  has really changed in her health. Is still using cpap. No issues with Eliquis.  F/u in afib clinic for surveillance of Tikosyn. She is maintaining  SR. When I saw her last she was c/o of lightheadedness. Carotid dopplers were ordered and clear of significant obstruction. She was then found to have an abscess st the base of her posterior neck which was excised but continues to  drain. She may have to have further surgery to resolve.she underwent Epley  Maneuvers for vertigo.  F/u in afib clinic 08/10/18 for Tikosyn surveillance. She reports no afib. Overall doing well.  F/u urgently in the afib clinic, 1/20, for afib present since 1/7. She feels that she may be paroxysmal but she can not be sure. Has not missed nay of her eliquis for the last 3 weeks.Taking dofetilide without change.  She has been under more stress as her son has been diagnosed with cancer.Ekg shows afib at 125 bpm. She has tried some 30 mg Cardizem but it  had not seemed to work and she noted that they were out of date x one year.   Today, she denies symptoms of palpitations, chest pain, shortness of breath, orthopnea, PND, lower extremity edema, dizziness, presyncope, syncope, or neurologic sequela. + for palpitations and fatigue. The patient is tolerating medications without difficulties and is otherwise without complaint today.   Past Medical History  Diagnosis Date  . Hypertension   . Heart murmur   . Coronary artery disease   . Atrial fibrillation ([place])   . Anemia   .  Arthritis   . Colon polyp   . Personal history of DVT (deep vein thrombosis) X 2    "left"  . Hyperlipidemia   . Pancreatitis     post hysterectomy  . Sleep apnea   . Aortic stenosis     Very mild  . Hypercholesteremia   . OSA on CPAP   . PAF (paroxysmal atrial fibrillation) ([place])   . History of valvular heart disease     LEFT  . Chicken pox as a child  . Measles as a child  . Mumps child and teenager  . Allergy   . Obesity   . Unspecified constipation 11/16/2013  . Heme positive stool 11/16/2013  . DDD (degenerative disc disease) 11/16/2013  . Unspecified sleep apnea 11/19/2013  . Allergic state 11/19/2013  . OA (osteoarthritis) 11/19/2013    S/p L TKR  . Benign paroxysmal positional vertigo 12/17/2013  . Abnormal results of thyroid function studies 12/17/2013  . Iron deficiency anemia 11/13/2013  . Personal history of colonic polyps 02/25/2014  . Spinal stenosis   . Obstructive sleep apnea 02/22/2015  . Visit for monitoring Tikosyn therapy 08/11/2015  . Medicare annual wellness visit, subsequent 09/15/2015   Past Surgical History  Procedure Laterality Date  . Tonsilectomy, adenoidectomy, bilateral myringotomy and tubes  child  . Lumbar laminectomy  40 yrs ago    "L3-4"  . Tubal ligation  85 years old  . Total abdominal  hysterectomy  1995    had 2 tumors- benign  . Replacement total knee Left 2013  . Cyst removal hand Bilateral 1990's    "played to much golf"  . Meniscus repair Bilateral 2000 and 2010  . Appendectomy    . Tonsillectomy and adenoidectomy    . Joint replacement    . Back surgery    . Cataract extraction w/ intraocular lens  implant, bilateral Bilateral 2016  . Dilation and curettage of uterus    . Cardioversion N/A 08/09/2015    Procedure: CARDIOVERSION;  Surgeon: Jerline Pain, MD;  Location: [facility];  Service: Cardiovascular;  Laterality: N/A;  . Eye surgery  2016    cataracts    Current Outpatient Prescriptions  Medication Sig Dispense Refill    . acetaminophen (TYLENOL) 650 MG CR tablet Take 1,300 mg by mouth every evening.    Marland Kitchen apixaban (ELIQUIS) 5 MG TABS tablet Take 1 tablet (5 mg total) by mouth 2 (two) times daily. 180 tablet 2  . atorvastatin (LIPITOR) 20 MG tablet Take 1 tablet (20 mg total) by mouth daily. 90 tablet 2  . bumetanide (BUMEX) 1 MG tablet TAKE ONE (1) TABLET BY MOUTH EVERY DAY 90 tablet 0  . calcium carbonate (OS-CAL) 600 MG TABS tablet Take 600 mg by mouth 2 (two) times daily with a meal.    . diltiazem (CARDIZEM CD) 240 MG 24 hr capsule Take 240 mg by mouth daily.    Marland Kitchen dofetilide (TIKOSYN) 250 MCG capsule Take 1 capsule (250 mcg total) by mouth 2 (two) times daily. 60 capsule 3  . Lactobacillus (PROBIOTIC ACIDOPHILUS) TABS Take 1 tablet by mouth daily. Reported on 03/04/2016    . lisinopril (PRINIVIL,ZESTRIL) 10 MG tablet Take 1 tablet (10 mg total) by mouth daily. 90 tablet 2  . loratadine (CLARITIN) 10 MG tablet Take 10 mg by mouth daily.     . potassium chloride (KLOR-CON 10) 10 MEQ tablet TAKE ONE (1) TABLET EACH DAY 90 tablet 2  . psyllium (REGULOID) 0.52 g capsule Take 0.52 g by mouth daily.     No current facility-administered medications for this encounter.    Allergies  Allergen Reactions  . Gabapentin Swelling  . Lactose Intolerance (Gi) Other (See Comments)    Bothers her stomach  . Zebeta [Bisoprolol Fumarate] Nausea Only  . Penicillins Rash    Social History   Social History  . Marital Status: Widowed    Spouse Name: N/A  . Number of Children: 3  . Years of Education: N/A   Occupational History  . retired Pharmacist, hospital    Social History Main Topics  . Smoking status: Never Smoker   . Smokeless tobacco: Never Used     Comment: never used tobacco  . Alcohol Use: No  . Drug Use: No  . Sexual Activity: No     Comment: [place], low sodium diet   Other Topics Concern  . Not on file   Social History Narrative    Family History  Problem Relation Age of Onset  . Heart  attack Mother 75  . Hyperlipidemia Mother     ?  Marland Kitchen Dementia Mother   . Pernicious anemia Mother   . COPD Father     smoker  . Cancer Father 58    prostate  . Heart disease Brother     quadruple bipass surgery  . Hyperlipidemia Brother   . Hypertension Brother   . Diabetes Maternal Grandmother  type 2  . Pernicious anemia Maternal Grandmother   . Colon cancer Neg Hx   . Pancreatic cancer Neg Hx   . Stomach cancer Neg Hx   . Throat cancer Neg Hx   . Liver disease Neg Hx   . Gout Son   . Atrial fibrillation Son   . Hyperlipidemia Son   . Cancer Maternal Grandfather     liver  . Cancer Paternal Grandmother     lung- doesn't think she smokes?  . Stroke Paternal Grandfather   . Cancer Son 50    non hodgin's lymphoma  . Gout Son   . Hyperlipidemia Son   . Sleep apnea Son     ROS- All systems are reviewed and negative except as per the HPI above     GEN- The patient is well appearing, alert and oriented x 3 today.   Head- normocephalic, atraumatic Eyes-  Sclera clear, conjunctiva pink Ears- hearing intact Oropharynx- clear Neck- supple, no JVP, soft bruit rt carotid Lymph- no cervical lymphadenopathy Lungs- Clear to ausculation bilaterally, normal work of breathing Heart- irregular rate and rhythm, no murmurs, rubs or gallops, PMI not laterally displaced GI- soft, NT, ND, + BS Extremities- no clubbing, cyanosis, or edema MS- no significant deformity or atrophy Skin- no rash or lesion Psych- euthymic mood, full affect Neuro- strength and sensation are intact  EKG-  afib at 134 bpm,qrs int 66 ms, qtc 519 ms  Epic records reviewed   Assessment and Plan: 1. Afib Present since 1/7 Pt is not sure of paroxysmal or persistence  Will place a 1 week zio patch Continue dofetilide 250 mg bid  Increase daily Cardizem from qd 120 mg to 120 mg bid Renew rx for 30 mg cardizem to be used as needed for RVR Continue apixaban with chadsvasc score of 5, states no missed  doses Bmet/mag pending  F/u in 7-10 days   Tracy Miles, [facility] [address] [place], [place] [address] [phone]

## 2018-09-29 NOTE — Progress Notes (Signed)
Primary Care Physician: [name], MD Referring Physician:Dr. Rayann Heman   Tracy Miles is a 85 y.o. female with a h/o paroxysmal afib, on dofetilide, that was seen 10 days ago and found to be in afib with RVR. She has had more stress recently due to a son with a cancer d A 1 week monitor was placed as pt was not sure if she was consistently in afib. The monitor was returned the first of the week but no results are in [place] yet. She however is now sure she is staying in afib and has noted more swelling around her eyes since Cardizem was increased to BID. However, she does not show any significant weight gain by last weight. She is on Bumex 1 mg daily. She is tired. She continues with RVR today.  Today, she denies symptoms of palpitations, chest pain, shortness of breath, orthopnea, PND, lower extremity edema, dizziness, presyncope, syncope, or neurologic sequela. The patient is tolerating medications without difficulties and is otherwise without complaint today.   Past Medical History:  Diagnosis Date  . Anemia   . Aortic stenosis    Very mild  . Benign paroxysmal positional vertigo 12/17/2013  . Chicken pox as a child  . Colon polyp   . Coronary artery disease   . DDD (degenerative disc disease) 11/16/2013  . Hyperlipidemia   . Hypertension   . Iron deficiency anemia 11/13/2013  . Mumps child and teenager  . OA (osteoarthritis) 11/19/2013   S/p L TKR  . Obesity   . OSA on CPAP   . Pancreatitis    post hysterectomy  . Persistent atrial fibrillation   . Personal history of colonic polyps 02/25/2014  . Personal history of DVT (deep vein thrombosis) X 2   "left"  . Sleep apnea   . Spinal stenosis    Past Surgical History:  Procedure Laterality Date  . APPENDECTOMY    . CARDIOVERSION N/A 08/09/2015   Procedure: CARDIOVERSION;  Surgeon: Jerline Pain, MD;  Location: [place];  Service: Cardiovascular;  Laterality: N/A;  . CATARACT EXTRACTION W/ INTRAOCULAR LENS  IMPLANT,  BILATERAL Bilateral 2016  . CYST REMOVAL HAND Bilateral 1990's   "played to much golf"  . DILATION AND CURETTAGE OF UTERUS    . LUMBAR LAMINECTOMY  40 yrs ago   "L3-4"  . MENISCUS REPAIR Bilateral 2000 and 2010  . REPLACEMENT TOTAL KNEE Left 2013  . TONSILECTOMY, ADENOIDECTOMY, BILATERAL MYRINGOTOMY AND TUBES  child  . TOTAL ABDOMINAL HYSTERECTOMY  1995   had 2 tumors- benign  . TUBAL LIGATION  85 years old    Current Outpatient Medications  Medication Sig Dispense Refill  . acetaminophen (TYLENOL) 325 MG tablet Take 325 mg by mouth daily.     Marland Kitchen apixaban (ELIQUIS) 5 MG TABS tablet Take 1 tablet (5 mg total) by mouth 2 (two) times daily. 180 tablet 1  . atorvastatin (LIPITOR) 20 MG tablet TAKE 1 TABLET BY MOUTH  DAILY AT 6 PM. 90 tablet 2  . bumetanide (BUMEX) 1 MG tablet TAKE ONE (1) TABLET BY MOUTH EVERY DAY 90 tablet 1  . calcium carbonate (OS-CAL) 600 MG TABS tablet Take 600 mg by mouth 2 (two) times daily with a meal.    . diltiazem (CARDIZEM CD) 120 MG 24 hr capsule Take 1 capsule (120 mg total) by mouth daily. 60 capsule 6  . diltiazem (CARDIZEM) 30 MG tablet Take 1 tablet (30 mg total) by mouth as directed. Take one tablet by mouth  every 4 hours AS NEEDED for A-fib HR > 100 as long as BP > 100. 45 tablet 2  . dofetilide (TIKOSYN) 250 MCG capsule Take 1 capsule (250 mcg total) by mouth 2 (two) times daily. 60 capsule 6  . lactobacillus acidophilus (BACID) TABS tablet Take 1 tablet by mouth daily.     Marland Kitchen lisinopril (PRINIVIL,ZESTRIL) 10 MG tablet Take 1 tablet (10 mg total) by mouth daily. 90 tablet 1  . loratadine (CLARITIN) 10 MG tablet Take 10 mg by mouth daily.     . potassium chloride (K-DUR) 10 MEQ tablet TAKE 1 TABLET BY MOUTH IN  THE MORNING AND 2 TABLETS  IN THE EVENING 270 tablet 2  . psyllium (REGULOID) 0.52 g capsule Take 0.52 g by mouth 2 (two) times daily.      No current facility-administered medications for this encounter.     Allergies  Allergen Reactions  .  Gabapentin Swelling  . Lactose Intolerance (Gi) Other (See Comments)    Bothers her stomach  . Zebeta [Bisoprolol Fumarate] Nausea Only  . Penicillins Rash      . Sulfa Antibiotics Rash    Social History   Socioeconomic History  . Marital status: Widowed    Spouse name: Not on file  . Number of children: 3  . Years of education: Not on file  . Highest education level: Not on file  Occupational History  . Occupation: retired Tour manager  . Financial resource strain: Not on file  . Food insecurity:    Worry: Not on file    Inability: Not on file  . Transportation needs:    Medical: Not on file    Non-medical: Not on file  Tobacco Use  . Smoking status: Never Smoker  . Smokeless tobacco: Never Used  . Tobacco comment: never used tobacco  Substance and Sexual Activity  . Alcohol use: No  . Drug use: No  . Sexual activity: Never    Comment: lives at [place], low sodium diet  Lifestyle  . Physical activity:    Days per week: Not on file    Minutes per session: Not on file  . Stress: Not on file  Relationships  . Social connections:    Talks on phone: Not on file    Gets together: Not on file    Attends religious service: Not on file    Active member of club or organization: Not on file    Attends meetings of clubs or organizations: Not on file    Relationship status: Not on file  . Intimate partner violence:    Fear of current or ex partner: Not on file    Emotionally abused: Not on file    Physically abused: Not on file    Forced sexual activity: Not on file  Other Topics Concern  . Not on file  Social History Narrative  . Not on file    Family History  Problem Relation Age of Onset  . Heart attack Mother 92  . Hyperlipidemia Mother        ?  Marland Kitchen Dementia Mother   . Pernicious anemia Mother   . COPD Father        smoker  . Cancer Father 55       prostate  . Heart disease Brother        quadruple bipass surgery  . Hyperlipidemia Brother    . Hypertension Brother   . Diabetes Maternal Grandmother  type 2  . Pernicious anemia Maternal Grandmother   . Gout Son   . Atrial fibrillation Son   . Hyperlipidemia Son   . Cancer Maternal Grandfather        liver  . Cancer Paternal Grandmother        lung- doesn't think she smokes?  . Stroke Paternal Grandfather   . Cancer Son 50       non hodgin's lymphoma  . Gout Son   . Hyperlipidemia Son   . Sleep apnea Son   . Colon cancer Neg Hx   . Pancreatic cancer Neg Hx   . Stomach cancer Neg Hx   . Throat cancer Neg Hx   . Liver disease Neg Hx     ROS- All systems are reviewed and negative except as per the HPI above  Physical Exam: Vitals:   09/29/18 1356  BP: 126/78  Pulse: (!) 142  SpO2: 95%  Weight: 124.3 kg  Height: 5\' 3"  (1.6 m)   Wt Readings from Last 3 Encounters:  09/29/18 124.3 kg  09/19/18 123.8 kg  09/07/18 123.8 kg    Labs: Lab Results  Component Value Date   NA 138 09/19/2018   K 4.1 09/19/2018   CL 101 09/19/2018   CO2 27 09/19/2018   GLUCOSE 107 (H) 09/19/2018   BUN 15 09/19/2018   CREATININE 1.15 (H) 09/19/2018   CALCIUM 9.3 09/19/2018   PHOS 3.7 08/23/2014   MG 2.1 09/19/2018   Lab Results  Component Value Date   INR 1.55 (H) 08/06/2015   Lab Results  Component Value Date   CHOL 152 04/05/2018   HDL 45.30 04/05/2018   LDLCALC 77 04/05/2018   TRIG 151.0 (H) 04/05/2018     GEN- The patient is well appearing, alert and oriented x 3 today.   Head- normocephalic, atraumatic Eyes-  Sclera clear, conjunctiva pink Ears- hearing intact Oropharynx- clear Neck- supple, no JVP Lymph- no cervical lymphadenopathy Lungs- Clear to ausculation bilaterally, normal work of breathing Heart- Rapid irregular rate and rhythm, no murmurs, rubs or gallops, PMI not laterally displaced GI- soft, NT, ND, + BS Extremities- no clubbing, cyanosis, or edema MS- no significant deformity or atrophy Skin- no rash or lesion Psych- euthymic mood,  full affect Neuro- strength and sensation are intact  EKG-afib at 142 bpm, qrs int 72 ms, qtc 492 ms Epic records reviewed 1 week Zio patch pending but clearly at this point feel she is persistent afib  Assessment and Plan: 1. Persistent afib with RVR Pt appears to been out of rhythm x 2 weeks Increase in cardizem has contributed to some facial edema, has not controlled v rates Go back to 120 mg cardizem daily We have been able to obtain DCCV spot for her tomorrow She has not missed any of eliquis 5 mg bid for at least 3 weeks Cbc/bmet today  F/u in afib clinic in one week  Butch Penny C. Jewelia Bocchino, [facility] [address] [place], [place] [address] [phone]

## 2018-09-29 NOTE — Patient Instructions (Signed)
Decrease cardizem 120mg  once a day  Cardioversion scheduled for Friday, January 31st  - Arrive at the [facility] and go to admitting at [place] not eat or drink anything after midnight the night prior to your procedure.  - Take all your morning medication with a sip of water prior to arrival.  - You will not be able to drive home after your procedure.

## 2018-09-30 ENCOUNTER — Ambulatory Visit (HOSPITAL_COMMUNITY): Payer: Medicare Other | Admitting: Certified Registered Nurse Anesthetist

## 2018-09-30 ENCOUNTER — Encounter (HOSPITAL_COMMUNITY)
Admission: RE | Disposition: A | Discharge status: Home / Self Care | Payer: Self-pay | Source: Home / Self Care | Attending: Cardiology

## 2018-09-30 ENCOUNTER — Other Ambulatory Visit: Payer: Self-pay

## 2018-09-30 ENCOUNTER — Ambulatory Visit (HOSPITAL_COMMUNITY)
Admission: RE | Admit: 2018-09-30 | Discharge: 2018-09-30 | Disposition: A | Discharge status: Home / Self Care | Payer: Medicare Other | Attending: Cardiology | Admitting: Cardiology

## 2018-09-30 ENCOUNTER — Encounter (HOSPITAL_COMMUNITY): Payer: Self-pay | Admitting: *Deleted

## 2018-09-30 DIAGNOSIS — E785 Hyperlipidemia, unspecified: Secondary | ICD-10-CM | POA: Insufficient documentation

## 2018-09-30 DIAGNOSIS — Z86718 Personal history of other venous thrombosis and embolism: Secondary | ICD-10-CM | POA: Diagnosis not present

## 2018-09-30 DIAGNOSIS — I10 Essential (primary) hypertension: Secondary | ICD-10-CM | POA: Diagnosis not present

## 2018-09-30 DIAGNOSIS — I480 Paroxysmal atrial fibrillation: Secondary | ICD-10-CM | POA: Insufficient documentation

## 2018-09-30 DIAGNOSIS — Z90710 Acquired absence of both cervix and uterus: Secondary | ICD-10-CM | POA: Diagnosis not present

## 2018-09-30 DIAGNOSIS — I2510 Atherosclerotic heart disease of native coronary artery without angina pectoris: Secondary | ICD-10-CM | POA: Insufficient documentation

## 2018-09-30 DIAGNOSIS — E669 Obesity, unspecified: Secondary | ICD-10-CM | POA: Diagnosis not present

## 2018-09-30 DIAGNOSIS — Z7901 Long term (current) use of anticoagulants: Secondary | ICD-10-CM | POA: Diagnosis not present

## 2018-09-30 DIAGNOSIS — Z79899 Other long term (current) drug therapy: Secondary | ICD-10-CM | POA: Insufficient documentation

## 2018-09-30 DIAGNOSIS — I350 Nonrheumatic aortic (valve) stenosis: Secondary | ICD-10-CM | POA: Insufficient documentation

## 2018-09-30 DIAGNOSIS — M1990 Unspecified osteoarthritis, unspecified site: Secondary | ICD-10-CM | POA: Diagnosis not present

## 2018-09-30 DIAGNOSIS — E7800 Pure hypercholesterolemia, unspecified: Secondary | ICD-10-CM | POA: Diagnosis not present

## 2018-09-30 DIAGNOSIS — G4733 Obstructive sleep apnea (adult) (pediatric): Secondary | ICD-10-CM | POA: Insufficient documentation

## 2018-09-30 DIAGNOSIS — I4891 Unspecified atrial fibrillation: Secondary | ICD-10-CM | POA: Diagnosis not present

## 2018-09-30 HISTORY — PX: CARDIOVERSION: SHX1299

## 2018-09-30 SURGERY — CARDIOVERSION
Anesthesia: General

## 2018-09-30 MED ORDER — PROPOFOL 10 MG/ML IV BOLUS
INTRAVENOUS | Status: DC | PRN
  Administered 2018-09-30: 70 mg via INTRAVENOUS

## 2018-09-30 MED ORDER — SODIUM CHLORIDE 0.9 % IV SOLN
INTRAVENOUS | Status: DC | PRN
  Administered 2018-09-30: 13:00:00 via INTRAVENOUS

## 2018-09-30 MED ORDER — LIDOCAINE 2% (20 MG/ML) 5 ML SYRINGE
INTRAMUSCULAR | Status: DC | PRN
  Administered 2018-09-30: 80 mg via INTRAVENOUS

## 2018-09-30 NOTE — CV Procedure (Signed)
   Cardioversion Note  Jolayne Branson [id number] [birth year]  Procedure: DC Cardioversion Indications: atrial fibrillation  Procedure Details Consent: Obtained Time Out: Verified patient identification, verified procedure, site/side was marked, verified correct patient position, special equipment/implants available, Radiology Safety Procedures followed,  medications/allergies/relevent history reviewed, required imaging and test results available.  Performed  The patient has been on adequate anticoagulation.  The patient received IV propofol administered by anesthesia staff for sedation.  Synchronous cardioversion was performed at 120 joules.  The cardioversion was successful.  Complications: No apparent complications Patient did tolerate procedure well.  [name], MD, [facility] 09/30/2018, 1:00 PM

## 2018-09-30 NOTE — Anesthesia Procedure Notes (Signed)
Procedure Name: General with mask airway Date/Time: 09/30/2018 12:53 PM Performed by: Candis Shine, CRNA Pre-anesthesia Checklist: Patient identified, Emergency Drugs available, Suction available and Patient being monitored Patient Re-evaluated:Patient Re-evaluated prior to induction Oxygen Delivery Method: Ambu bag Preoxygenation: Pre-oxygenation with 100% oxygen Induction Type: IV induction Dental Injury: Teeth and Oropharynx as per pre-operative assessment

## 2018-09-30 NOTE — Anesthesia Preprocedure Evaluation (Signed)
Anesthesia Evaluation  Patient identified by MRN, date of birth, ID band Patient awake    Reviewed: Allergy & Precautions, H&P , NPO status , Patient's Chart, lab work & pertinent test results  Airway Mallampati: II  TM Distance: >3 FB Neck ROM: Full    Dental no notable dental hx. (+) Teeth Intact, Dental Advisory Given   Pulmonary sleep apnea and Continuous Positive Airway Pressure Ventilation ,    Pulmonary exam normal breath sounds clear to auscultation       Cardiovascular hypertension, Pt. on medications + CAD  + dysrhythmias Atrial Fibrillation  Rhythm:Irregular Rate:Normal     Neuro/Psych negative neurological ROS  negative psych ROS   GI/Hepatic negative GI ROS, Neg liver ROS,   Endo/Other  Morbid obesity  Renal/GU negative Renal ROS  negative genitourinary   Musculoskeletal  (+) Arthritis , Osteoarthritis,    Abdominal   Peds  Hematology  (+) Blood dyscrasia, anemia ,   Anesthesia Other Findings   Reproductive/Obstetrics negative OB ROS                             Anesthesia Physical  Anesthesia Plan  ASA: III  Anesthesia Plan: General   Post-op Pain Management:    Induction: Intravenous  PONV Risk Score and Plan:   Airway Management Planned: Mask and Nasal Cannula  Additional Equipment:   Intra-op Plan:   Post-operative Plan:   Informed Consent: I have reviewed the patients History and Physical, chart, labs and discussed the procedure including the risks, benefits and alternatives for the proposed anesthesia with the patient or authorized representative who has indicated his/her understanding and acceptance.     Dental advisory given  Plan Discussed with: CRNA, Anesthesiologist and Surgeon  Anesthesia Plan Comments:         Anesthesia Quick Evaluation

## 2018-09-30 NOTE — Interval H&P Note (Signed)
History and Physical Interval Note:  09/30/2018 12:12 PM  Tracy Miles  has presented today for surgery, with the diagnosis of AFIB  The various methods of treatment have been discussed with the patient and family. After consideration of risks, benefits and other options for treatment, the patient has consented to  Procedure(s): CARDIOVERSION (N/A) as a surgical intervention .  The patient's history has been reviewed, patient examined, no change in status, stable for surgery.  I have reviewed the patient's chart and labs.  Questions were answered to the patient's satisfaction.     [name]

## 2018-09-30 NOTE — Anesthesia Postprocedure Evaluation (Signed)
Anesthesia Post Note  Patient: [name]  Procedure(s) Performed: CARDIOVERSION (N/A )     Patient location during evaluation: PACU Anesthesia Type: General Level of consciousness: awake and alert Pain management: pain level controlled Vital Signs Assessment: post-procedure vital signs reviewed and stable Respiratory status: spontaneous breathing, nonlabored ventilation, respiratory function stable and patient connected to nasal cannula oxygen Cardiovascular status: blood pressure returned to baseline and stable Postop Assessment: no apparent nausea or vomiting Anesthetic complications: no    Last Vitals:  Vitals:   09/30/18 1310 09/30/18 1320  BP: 125/64 (!) 105/56  Pulse: 81 79  Resp: 19 14  Temp:    SpO2: 94% 95%    Last Pain:  Vitals:   09/30/18 1320  TempSrc:   PainSc: 0-No pain                 Daffney Greenly

## 2018-09-30 NOTE — Transfer of Care (Signed)
Immediate Anesthesia Transfer of Care Note  Patient: Tracy Miles  Procedure(s) Performed: CARDIOVERSION (N/A )  Patient Location: Endoscopy Unit  Anesthesia Type:General  Level of Consciousness: awake, alert  and oriented  Airway & Oxygen Therapy: Patient Spontanous Breathing  Post-op Assessment: Report given to RN and Post -op Vital signs reviewed and stable  Post vital signs: Reviewed and stable  Last Vitals:  Vitals Value Taken Time  BP 125/64 09/30/2018  1:02 PM  Temp    Pulse 81 09/30/2018  1:03 PM  Resp 18 09/30/2018  1:03 PM  SpO2 94 % 09/30/2018  1:03 PM    Last Pain:  Vitals:   09/30/18 1200  TempSrc: Oral  PainSc: 0-No pain         Complications: No apparent anesthesia complications

## 2018-09-30 NOTE — Discharge Instructions (Signed)
Electrical Cardioversion, Care After °This sheet gives you information about how to care for yourself after your procedure. Your health care provider may also give you more specific instructions. If you have problems or questions, contact your health care provider. °What can I expect after the procedure? °After the procedure, it is common to have: °· Some redness on the skin where the shocks were given. °Follow these instructions at home: ° °· Do not drive for 24 hours if you were given a medicine to help you relax (sedative). °· Take over-the-counter and prescription medicines only as told by your health care provider. °· Ask your health care provider how to check your pulse. Check it often. °· Rest for 48 hours after the procedure or as told by your health care provider. °· Avoid or limit your caffeine use as told by your health care provider. °Contact a health care provider if: °· You feel like your heart is beating too quickly or your pulse is not regular. °· You have a serious muscle cramp that does not go away. °Get help right away if: ° °· You have discomfort in your chest. °· You are dizzy or you feel faint. °· You have trouble breathing or you are short of breath. °· Your speech is slurred. °· You have trouble moving an arm or leg on one side of your body. °· Your fingers or toes turn cold or blue. °This information is not intended to replace advice given to you by your health care provider. Make sure you discuss any questions you have with your health care provider. °Document Released: 06/07/2013 Document Revised: 03/20/2016 Document Reviewed: 02/21/2016 °Elsevier Interactive Patient Education © 2019 [employer] ° °

## 2018-10-01 ENCOUNTER — Encounter (HOSPITAL_COMMUNITY): Payer: Self-pay | Admitting: Cardiology

## 2018-10-06 ENCOUNTER — Other Ambulatory Visit: Payer: Self-pay | Admitting: Family Medicine

## 2018-10-11 ENCOUNTER — Encounter (HOSPITAL_COMMUNITY): Payer: Self-pay | Admitting: Nurse Practitioner

## 2018-10-11 ENCOUNTER — Ambulatory Visit (HOSPITAL_COMMUNITY)
Admission: RE | Admit: 2018-10-11 | Discharge: 2018-10-11 | Disposition: A | Discharge status: Home / Self Care | Payer: Medicare Other | Source: Ambulatory Visit | Attending: Nurse Practitioner | Admitting: Nurse Practitioner

## 2018-10-11 VITALS — BP 142/74 | HR 71 | Ht 63.0 in | Wt 270.0 lb

## 2018-10-11 DIAGNOSIS — Z90710 Acquired absence of both cervix and uterus: Secondary | ICD-10-CM | POA: Insufficient documentation

## 2018-10-11 DIAGNOSIS — I2510 Atherosclerotic heart disease of native coronary artery without angina pectoris: Secondary | ICD-10-CM | POA: Diagnosis not present

## 2018-10-11 DIAGNOSIS — Z882 Allergy status to sulfonamides status: Secondary | ICD-10-CM | POA: Insufficient documentation

## 2018-10-11 DIAGNOSIS — I4819 Other persistent atrial fibrillation: Secondary | ICD-10-CM | POA: Diagnosis not present

## 2018-10-11 DIAGNOSIS — Z880 Allergy status to penicillin: Secondary | ICD-10-CM | POA: Diagnosis not present

## 2018-10-11 DIAGNOSIS — Z888 Allergy status to other drugs, medicaments and biological substances status: Secondary | ICD-10-CM | POA: Diagnosis not present

## 2018-10-11 DIAGNOSIS — Z833 Family history of diabetes mellitus: Secondary | ICD-10-CM | POA: Insufficient documentation

## 2018-10-11 DIAGNOSIS — Z7901 Long term (current) use of anticoagulants: Secondary | ICD-10-CM | POA: Insufficient documentation

## 2018-10-11 DIAGNOSIS — Z8249 Family history of ischemic heart disease and other diseases of the circulatory system: Secondary | ICD-10-CM | POA: Insufficient documentation

## 2018-10-11 DIAGNOSIS — E785 Hyperlipidemia, unspecified: Secondary | ICD-10-CM | POA: Insufficient documentation

## 2018-10-11 DIAGNOSIS — I10 Essential (primary) hypertension: Secondary | ICD-10-CM | POA: Diagnosis not present

## 2018-10-11 DIAGNOSIS — Z801 Family history of malignant neoplasm of trachea, bronchus and lung: Secondary | ICD-10-CM | POA: Diagnosis not present

## 2018-10-11 DIAGNOSIS — G4733 Obstructive sleep apnea (adult) (pediatric): Secondary | ICD-10-CM | POA: Diagnosis not present

## 2018-10-11 DIAGNOSIS — Z96652 Presence of left artificial knee joint: Secondary | ICD-10-CM | POA: Diagnosis not present

## 2018-10-11 DIAGNOSIS — E669 Obesity, unspecified: Secondary | ICD-10-CM | POA: Diagnosis not present

## 2018-10-11 DIAGNOSIS — Z8349 Family history of other endocrine, nutritional and metabolic diseases: Secondary | ICD-10-CM | POA: Insufficient documentation

## 2018-10-11 DIAGNOSIS — Z8042 Family history of malignant neoplasm of prostate: Secondary | ICD-10-CM | POA: Insufficient documentation

## 2018-10-11 DIAGNOSIS — Z800 Family history of malignant neoplasm of digestive organs: Secondary | ICD-10-CM | POA: Diagnosis not present

## 2018-10-11 DIAGNOSIS — E739 Lactose intolerance, unspecified: Secondary | ICD-10-CM | POA: Diagnosis not present

## 2018-10-11 DIAGNOSIS — Z807 Family history of other malignant neoplasms of lymphoid, hematopoietic and related tissues: Secondary | ICD-10-CM | POA: Diagnosis not present

## 2018-10-11 DIAGNOSIS — I350 Nonrheumatic aortic (valve) stenosis: Secondary | ICD-10-CM | POA: Diagnosis not present

## 2018-10-11 DIAGNOSIS — Z79899 Other long term (current) drug therapy: Secondary | ICD-10-CM | POA: Insufficient documentation

## 2018-10-11 DIAGNOSIS — Z6841 Body Mass Index (BMI) 40.0 and over, adult: Secondary | ICD-10-CM | POA: Insufficient documentation

## 2018-10-11 NOTE — Progress Notes (Signed)
Primary Care Physician: [name], MD Referring Physician:Dr. Rayann Heman   Tracy Miles is a 85 y.o. female with a h/o paroxysmal afib, on dofetilide, that was seen 10 days ago and found to be in afib with RVR. She has had more stress recently due to a son with a cancer d A 1 week monitor was placed as pt was not sure if she was consistently in afib. The monitor was returned the first of the week but no results are in [place] yet. She however is now sure she is staying in afib and has noted more swelling around her eyes since Cardizem was increased to BID. However, she does not show any significant weight gain by last weight. She is on Bumex 1 mg daily. She is tired. She continues with RVR today.  F/u afib clinic 10/11/18. She had successful cardioversion and remains in SR. She feels much improved. Edema is much improved since reduction in diltiazem dose.  She feels improved.  Today, she denies symptoms of palpitations, chest pain, shortness of breath, orthopnea, PND, lower extremity edema, dizziness, presyncope, syncope, or neurologic sequela. The patient is tolerating medications without difficulties and is otherwise without complaint today.   Past Medical History:  Diagnosis Date  . Anemia   . Aortic stenosis    Very mild  . Benign paroxysmal positional vertigo 12/17/2013  . Chicken pox as a child  . Colon polyp   . Coronary artery disease   . DDD (degenerative disc disease) 11/16/2013  . Hyperlipidemia   . Hypertension   . Iron deficiency anemia 11/13/2013  . Mumps child and teenager  . OA (osteoarthritis) 11/19/2013   S/p L TKR  . Obesity   . OSA on CPAP   . Pancreatitis    post hysterectomy  . Persistent atrial fibrillation   . Personal history of colonic polyps 02/25/2014  . Personal history of DVT (deep vein thrombosis) X 2   "left"  . Sleep apnea   . Spinal stenosis    Past Surgical History:  Procedure Laterality Date  . APPENDECTOMY    . CARDIOVERSION N/A 08/09/2015     Procedure: CARDIOVERSION;  Surgeon: Jerline Pain, MD;  Location: [facility];  Service: Cardiovascular;  Laterality: N/A;  . CARDIOVERSION N/A 09/30/2018   Procedure: CARDIOVERSION;  Surgeon: [name], MD;  Location: [facility] ENDOSCOPY;  Service: Cardiovascular;  Laterality: N/A;  . CATARACT EXTRACTION W/ INTRAOCULAR LENS  IMPLANT, BILATERAL Bilateral 2016  . CYST REMOVAL HAND Bilateral 1990's   "played to much golf"  . DILATION AND CURETTAGE OF UTERUS    . LUMBAR LAMINECTOMY  40 yrs ago   "L3-4"  . MENISCUS REPAIR Bilateral 2000 and 2010  . REPLACEMENT TOTAL KNEE Left 2013  . TONSILECTOMY, ADENOIDECTOMY, BILATERAL MYRINGOTOMY AND TUBES  child  . TOTAL ABDOMINAL HYSTERECTOMY  1995   had 2 tumors- benign  . TUBAL LIGATION  85 years old    Current Outpatient Medications  Medication Sig Dispense Refill  . acetaminophen (TYLENOL) 325 MG tablet Take 325 mg by mouth daily.     Marland Kitchen apixaban (ELIQUIS) 5 MG TABS tablet Take 1 tablet (5 mg total) by mouth 2 (two) times daily. 180 tablet 1  . atorvastatin (LIPITOR) 20 MG tablet TAKE 1 TABLET BY MOUTH  DAILY AT 6 PM. 90 tablet 2  . bumetanide (BUMEX) 1 MG tablet TAKE ONE (1) TABLET BY MOUTH EVERY DAY 90 tablet 1  . calcium carbonate (OS-CAL) 600 MG TABS tablet Take 600  mg by mouth 2 (two) times daily with a meal.    . diltiazem (CARDIZEM CD) 120 MG 24 hr capsule Take 1 capsule (120 mg total) by mouth daily. 60 capsule 6  . diltiazem (CARDIZEM) 30 MG tablet Take 1 tablet (30 mg total) by mouth as directed. Take one tablet by mouth every 4 hours AS NEEDED for A-fib HR > 100 as long as BP > 100. 45 tablet 2  . dofetilide (TIKOSYN) 250 MCG capsule Take 1 capsule (250 mcg total) by mouth 2 (two) times daily. 60 capsule 6  . lactobacillus acidophilus (BACID) TABS tablet Take 1 tablet by mouth daily.     Marland Kitchen lisinopril (PRINIVIL,ZESTRIL) 10 MG tablet Take 1 tablet (10 mg total) by mouth daily. 90 tablet 1  . loratadine (CLARITIN) 10 MG tablet Take 10  mg by mouth daily.     . potassium chloride (K-DUR) 10 MEQ tablet TAKE 1 TABLET BY MOUTH IN  THE MORNING AND 2 TABLETS  IN THE EVENING (Patient taking differently: Take 10-20 mEq by mouth See admin instructions. TAKE 1 TABLET BY MOUTH IN  THE MORNING AND 2 TABLETS  IN THE EVENING) 270 tablet 2  . psyllium (REGULOID) 0.52 g capsule Take 0.52 g by mouth 2 (two) times daily.      No current facility-administered medications for this encounter.     Allergies  Allergen Reactions  . Gabapentin Swelling  . Lactose Intolerance (Gi) Other (See Comments)    Bothers her stomach  . Zebeta [Bisoprolol Fumarate] Nausea Only  . Penicillins Rash      . Sulfa Antibiotics Rash    Social History   Socioeconomic History  . Marital status: Widowed    Spouse name: Not on file  . Number of children: 3  . Years of education: Not on file  . Highest education level: Not on file  Occupational History  . Occupation: retired Tour manager  . Financial resource strain: Not on file  . Food insecurity:    Worry: Not on file    Inability: Not on file  . Transportation needs:    Medical: Not on file    Non-medical: Not on file  Tobacco Use  . Smoking status: Never Smoker  . Smokeless tobacco: Never Used  . Tobacco comment: never used tobacco  Substance and Sexual Activity  . Alcohol use: No  . Drug use: No  . Sexual activity: Never    Comment: lives at [place], low sodium diet  Lifestyle  . Physical activity:    Days per week: Not on file    Minutes per session: Not on file  . Stress: Not on file  Relationships  . Social connections:    Talks on phone: Not on file    Gets together: Not on file    Attends religious service: Not on file    Active member of club or organization: Not on file    Attends meetings of clubs or organizations: Not on file    Relationship status: Not on file  . Intimate partner violence:    Fear of current or ex partner: Not on file    Emotionally  abused: Not on file    Physically abused: Not on file    Forced sexual activity: Not on file  Other Topics Concern  . Not on file  Social History Narrative  . Not on file    Family History  Problem Relation Age of Onset  . Heart attack Mother  27  . Hyperlipidemia Mother        ?  Marland Kitchen Dementia Mother   . Pernicious anemia Mother   . COPD Father        smoker  . Cancer Father 5       prostate  . Heart disease Brother        quadruple bipass surgery  . Hyperlipidemia Brother   . Hypertension Brother   . Diabetes Maternal Grandmother        type 2  . Pernicious anemia Maternal Grandmother   . Gout Son   . Atrial fibrillation Son   . Hyperlipidemia Son   . Cancer Maternal Grandfather        liver  . Cancer Paternal Grandmother        lung- doesn't think she smokes?  . Stroke Paternal Grandfather   . Cancer Son 50       non hodgin's lymphoma  . Gout Son   . Hyperlipidemia Son   . Sleep apnea Son   . Colon cancer Neg Hx   . Pancreatic cancer Neg Hx   . Stomach cancer Neg Hx   . Throat cancer Neg Hx   . Liver disease Neg Hx     ROS- All systems are reviewed and negative except as per the HPI above  Physical Exam: Vitals:   10/11/18 1425  BP: (!) 142/74  Pulse: 71  Weight: 122.5 kg  Height: 5\' 3"  (1.6 m)   Wt Readings from Last 3 Encounters:  10/11/18 122.5 kg  09/29/18 124.3 kg  09/19/18 123.8 kg    Labs: Lab Results  Component Value Date   NA 139 09/29/2018   K 4.0 09/29/2018   CL 101 09/29/2018   CO2 26 09/29/2018   GLUCOSE 111 (H) 09/29/2018   BUN 17 09/29/2018   CREATININE 1.15 (H) 09/29/2018   CALCIUM 9.3 09/29/2018   PHOS 3.7 08/23/2014   MG 2.1 09/19/2018   Lab Results  Component Value Date   INR 1.55 (H) 08/06/2015   Lab Results  Component Value Date   CHOL 152 04/05/2018   HDL 45.30 04/05/2018   LDLCALC 77 04/05/2018   TRIG 151.0 (H) 04/05/2018     GEN- The patient is well appearing, alert and oriented x 3 today.   Head-  normocephalic, atraumatic Eyes-  Sclera clear, conjunctiva pink Ears- hearing intact Oropharynx- clear Neck- supple, no JVP Lymph- no cervical lymphadenopathy Lungs- Clear to ausculation bilaterally, normal work of breathing Heart- Rapid irregular rate and rhythm, no murmurs, rubs or gallops, PMI not laterally displaced GI- soft, NT, ND, + BS Extremities- no clubbing, cyanosis, or edema MS- no significant deformity or atrophy Skin- no rash or lesion Psych- euthymic mood, full affect Neuro- strength and sensation are intact  EKG-SR at 71 bpm, Pr int 174 ms, qrs int 76 ms, qtc 458 ms Epic records reviewed   Assessment and Plan: 1. Persistent afib with RVR S/p successful cardioversion and staying in SR Continue 120 mg cardizem daily Continue eliquis 5 mg bid  Continue dofetilide 250 mcgs bid  2. HTN Stable  F/u in afib clinic 6/17  Butch Penny C. Nikiya Starn, [facility] [address] [place], [place] [address] [phone]

## 2018-10-20 ENCOUNTER — Ambulatory Visit (INDEPENDENT_AMBULATORY_CARE_PROVIDER_SITE_OTHER): Payer: Medicare Other | Admitting: Family Medicine

## 2018-10-20 DIAGNOSIS — I4819 Other persistent atrial fibrillation: Secondary | ICD-10-CM | POA: Diagnosis not present

## 2018-10-20 DIAGNOSIS — R739 Hyperglycemia, unspecified: Secondary | ICD-10-CM | POA: Diagnosis not present

## 2018-10-20 DIAGNOSIS — E782 Mixed hyperlipidemia: Secondary | ICD-10-CM

## 2018-10-20 DIAGNOSIS — I10 Essential (primary) hypertension: Secondary | ICD-10-CM | POA: Diagnosis not present

## 2018-10-20 NOTE — Assessment & Plan Note (Signed)
hgba1c acceptable, minimize simple carbs. Increase exercise as tolerated.  

## 2018-10-20 NOTE — Assessment & Plan Note (Signed)
Well controlled, no changes to meds. Encouraged heart healthy diet such as the DASH diet and exercise as tolerated.  °

## 2018-10-20 NOTE — Assessment & Plan Note (Signed)
Had a month worth of afib up to 150 bpm andshe had to be cardioverted and she is better. Following closely with afib clinic

## 2018-10-20 NOTE — Progress Notes (Signed)
Subjective:    Patient ID: Tracy Miles, female    DOB: 1933/12/17, 85 y.o.   MRN: [id number]  No chief complaint on file.   HPI Patient is in today for followup. She had a bad a bad January with  Persistent rapid atrial fibrillation which left her weak and shortness of breath. After a cardioversion she is doing much better now. Otherwise she denies any new complaints since her last visit. Denies CP/palp/SOB/HA/congestion/fevers/GI or GU c/o. Taking meds as prescribed  Past Medical History:  Diagnosis Date  . Anemia   . Aortic stenosis    Very mild  . Benign paroxysmal positional vertigo 12/17/2013  . Chicken pox as a child  . Colon polyp   . Coronary artery disease   . DDD (degenerative disc disease) 11/16/2013  . Hyperlipidemia   . Hypertension   . Iron deficiency anemia 11/13/2013  . Mumps child and teenager  . OA (osteoarthritis) 11/19/2013   S/p L TKR  . Obesity   . OSA on CPAP   . Pancreatitis    post hysterectomy  . Persistent atrial fibrillation   . Personal history of colonic polyps 02/25/2014  . Personal history of DVT (deep vein thrombosis) X 2   "left"  . Sleep apnea   . Spinal stenosis     Past Surgical History:  Procedure Laterality Date  . APPENDECTOMY    . CARDIOVERSION N/A 08/09/2015   Procedure: CARDIOVERSION;  Surgeon: Jerline Pain, MD;  Location: [facility];  Service: Cardiovascular;  Laterality: N/A;  . CARDIOVERSION N/A 09/30/2018   Procedure: CARDIOVERSION;  Surgeon: [name], MD;  Location: [facility] ENDOSCOPY;  Service: Cardiovascular;  Laterality: N/A;  . CATARACT EXTRACTION W/ INTRAOCULAR LENS  IMPLANT, BILATERAL Bilateral 2016  . CYST REMOVAL HAND Bilateral 1990's   "played to much golf"  . DILATION AND CURETTAGE OF UTERUS    . LUMBAR LAMINECTOMY  40 yrs ago   "L3-4"  . MENISCUS REPAIR Bilateral 2000 and 2010  . REPLACEMENT TOTAL KNEE Left 2013  . TONSILECTOMY, ADENOIDECTOMY, BILATERAL MYRINGOTOMY AND TUBES  child  . TOTAL  ABDOMINAL HYSTERECTOMY  1995   had 2 tumors- benign  . TUBAL LIGATION  85 years old    Family History  Problem Relation Age of Onset  . Heart attack Mother 51  . Hyperlipidemia Mother        ?  Marland Kitchen Dementia Mother   . Pernicious anemia Mother   . COPD Father        smoker  . Cancer Father 63       prostate  . Heart disease Brother        quadruple bipass surgery  . Hyperlipidemia Brother   . Hypertension Brother   . Diabetes Maternal Grandmother        type 2  . Pernicious anemia Maternal Grandmother   . Gout Son   . Atrial fibrillation Son   . Hyperlipidemia Son   . Cancer Maternal Grandfather        liver  . Cancer Paternal Grandmother        lung- doesn't think she smokes?  . Stroke Paternal Grandfather   . Cancer Son 50       non hodgin's lymphoma  . Gout Son   . Hyperlipidemia Son   . Sleep apnea Son   . Colon cancer Neg Hx   . Pancreatic cancer Neg Hx   . Stomach cancer Neg Hx   . Throat cancer Neg Hx   .  Liver disease Neg Hx     Social History   Socioeconomic History  . Marital status: Widowed    Spouse name: Not on file  . Number of children: 3  . Years of education: Not on file  . Highest education level: Not on file  Occupational History  . Occupation: retired Tour manager  . Financial resource strain: Not on file  . Food insecurity:    Worry: Not on file    Inability: Not on file  . Transportation needs:    Medical: Not on file    Non-medical: Not on file  Tobacco Use  . Smoking status: Never Smoker  . Smokeless tobacco: Never Used  . Tobacco comment: never used tobacco  Substance and Sexual Activity  . Alcohol use: No  . Drug use: No  . Sexual activity: Never    Comment: lives [place], low sodium diet  Lifestyle  . Physical activity:    Days per week: Not on file    Minutes per session: Not on file  . Stress: Not on file  Relationships  . Social connections:    Talks on phone: Not on file    Gets together: Not  on file    Attends religious service: Not on file    Active member of club or organization: Not on file    Attends meetings of clubs or organizations: Not on file    Relationship status: Not on file  . Intimate partner violence:    Fear of current or ex partner: Not on file    Emotionally abused: Not on file    Physically abused: Not on file    Forced sexual activity: Not on file  Other Topics Concern  . Not on file  Social History Narrative  . Not on file    Outpatient Medications Prior to Visit  Medication Sig Dispense Refill  . acetaminophen (TYLENOL) 325 MG tablet Take 325 mg by mouth daily.     Marland Kitchen apixaban (ELIQUIS) 5 MG TABS tablet Take 1 tablet (5 mg total) by mouth 2 (two) times daily. 180 tablet 1  . atorvastatin (LIPITOR) 20 MG tablet TAKE 1 TABLET BY MOUTH  DAILY AT 6 PM. 90 tablet 2  . bumetanide (BUMEX) 1 MG tablet TAKE ONE (1) TABLET BY MOUTH EVERY DAY 90 tablet 1  . calcium carbonate (OS-CAL) 600 MG TABS tablet Take 600 mg by mouth 2 (two) times daily with a meal.    . diltiazem (CARDIZEM CD) 120 MG 24 hr capsule Take 1 capsule (120 mg total) by mouth daily. 60 capsule 6  . diltiazem (CARDIZEM) 30 MG tablet Take 1 tablet (30 mg total) by mouth as directed. Take one tablet by mouth every 4 hours AS NEEDED for A-fib HR > 100 as long as BP > 100. 45 tablet 2  . dofetilide (TIKOSYN) 250 MCG capsule Take 1 capsule (250 mcg total) by mouth 2 (two) times daily. 60 capsule 6  . lactobacillus acidophilus (BACID) TABS tablet Take 1 tablet by mouth daily.     Marland Kitchen lisinopril (PRINIVIL,ZESTRIL) 10 MG tablet Take 1 tablet (10 mg total) by mouth daily. 90 tablet 1  . loratadine (CLARITIN) 10 MG tablet Take 10 mg by mouth daily.     . potassium chloride (K-DUR) 10 MEQ tablet TAKE 1 TABLET BY MOUTH IN  THE MORNING AND 2 TABLETS  IN THE EVENING (Patient taking differently: Take 10-20 mEq by mouth See admin instructions. TAKE 1 TABLET BY  MOUTH IN  THE MORNING AND 2 TABLETS  IN THE EVENING) 270  tablet 2  . psyllium (REGULOID) 0.52 g capsule Take 0.52 g by mouth 2 (two) times daily.      No facility-administered medications prior to visit.      Review of Systems  Constitutional: Positive for malaise/fatigue. Negative for fever.  HENT: Negative for congestion.   Eyes: Negative for blurred vision.  Respiratory: Negative for shortness of breath.   Cardiovascular: Negative for chest pain, palpitations and leg swelling.  Gastrointestinal: Negative for abdominal pain, blood in stool and nausea.  Genitourinary: Negative for dysuria and frequency.  Musculoskeletal: Negative for falls.  Skin: Negative for rash.  Neurological: Negative for dizziness, loss of consciousness and headaches.  Endo/Heme/Allergies: Negative for environmental allergies.  Psychiatric/Behavioral: Negative for depression. The patient is not nervous/anxious.        Objective:    Physical Exam Vitals signs and nursing note reviewed.  Constitutional:      General: She is not in acute distress.    Appearance: She is well-developed.  HENT:     Head: Normocephalic and atraumatic.     Nose: Nose normal.  Eyes:     General:        Right eye: No discharge.        Left eye: No discharge.  Neck:     Musculoskeletal: Normal range of motion and neck supple.  Cardiovascular:     Rate and Rhythm: Normal rate and regular rhythm.     Heart sounds: No murmur.  Pulmonary:     Effort: Pulmonary effort is normal.     Breath sounds: Normal breath sounds.  Abdominal:     General: Bowel sounds are normal.     Palpations: Abdomen is soft.     Tenderness: There is no abdominal tenderness.  Skin:    General: Skin is warm and dry.  Neurological:     Mental Status: She is alert and oriented to person, place, and time.     BP (!) 142/74 (BP Location: Left Arm, Patient Position: Sitting, Cuff Size: Normal)   Pulse 72   Temp 97.8 F (36.6 C) (Oral)   Resp 18   Wt 271 lb 12.8 oz (123.3 kg)   SpO2 97%   BMI 48.15  kg/m  Wt Readings from Last 3 Encounters:  10/20/18 271 lb 12.8 oz (123.3 kg)  10/11/18 270 lb (122.5 kg)  09/29/18 274 lb (124.3 kg)     Lab Results  Component Value Date   WBC 6.6 09/29/2018   HGB 12.8 09/29/2018   HCT 39.5 09/29/2018   PLT 278 09/29/2018   GLUCOSE 111 (H) 09/29/2018   CHOL 152 04/05/2018   TRIG 151.0 (H) 04/05/2018   HDL 45.30 04/05/2018   LDLCALC 77 04/05/2018   ALT 9 04/05/2018   AST 13 04/05/2018   NA 139 09/29/2018   K 4.0 09/29/2018   CL 101 09/29/2018   CREATININE 1.15 (H) 09/29/2018   BUN 17 09/29/2018   CO2 26 09/29/2018   TSH 2.06 04/05/2018   INR 1.55 (H) 08/06/2015   HGBA1C 5.9 04/05/2018    Lab Results  Component Value Date   TSH 2.06 04/05/2018   Lab Results  Component Value Date   WBC 6.6 09/29/2018   HGB 12.8 09/29/2018   HCT 39.5 09/29/2018   MCV 96.3 09/29/2018   PLT 278 09/29/2018   Lab Results  Component Value Date   NA 139 09/29/2018   K 4.0  09/29/2018   CO2 26 09/29/2018   GLUCOSE 111 (H) 09/29/2018   BUN 17 09/29/2018   CREATININE 1.15 (H) 09/29/2018   BILITOT 0.7 04/05/2018   ALKPHOS 83 04/05/2018   AST 13 04/05/2018   ALT 9 04/05/2018   PROT 6.9 04/05/2018   ALBUMIN 4.4 04/05/2018   CALCIUM 9.3 09/29/2018   ANIONGAP 12 09/29/2018   GFR 55.77 (L) 04/05/2018   Lab Results  Component Value Date   CHOL 152 04/05/2018   Lab Results  Component Value Date   HDL 45.30 04/05/2018   Lab Results  Component Value Date   LDLCALC 77 04/05/2018   Lab Results  Component Value Date   TRIG 151.0 (H) 04/05/2018   Lab Results  Component Value Date   CHOLHDL 3 04/05/2018   Lab Results  Component Value Date   HGBA1C 5.9 04/05/2018       Assessment & Plan:   Problem List Items Addressed This Visit    Atrial fibrillation ([place])    Had a month worth of afib up to 150 bpm andshe had to be cardioverted and she is better. Following closely with afib clinic      Essential hypertension    Well controlled, no  changes to meds. Encouraged heart healthy diet such as the DASH diet and exercise as tolerated.       Relevant Orders   CBC   Comprehensive metabolic panel   TSH   Hyperlipidemia, mixed    Encouraged heart healthy diet, increase exercise, avoid trans fats, consider a krill oil cap daily      Relevant Orders   Lipid panel   Hyperglycemia    hgba1c acceptable, minimize simple carbs. Increase exercise as tolerated.       Relevant Orders   Hemoglobin A1c      I am having Tracy Miles "Tracy Miles" maintain her calcium carbonate, loratadine, psyllium, acetaminophen, lactobacillus acidophilus, dofetilide, atorvastatin, apixaban, potassium chloride, lisinopril, diltiazem, diltiazem, and bumetanide.  No orders of the defined types were placed in this encounter.    [name], MD

## 2018-10-20 NOTE — Patient Instructions (Signed)

## 2018-10-20 NOTE — Assessment & Plan Note (Signed)
Encouraged heart healthy diet, increase exercise, avoid trans fats, consider a krill oil cap daily 

## 2018-10-21 LAB — HEMOGLOBIN A1C
HEMOGLOBIN A1C: 5.7 %{Hb} — AB (ref <5.7)
MEAN PLASMA GLUCOSE: 117 (calc)
eAG (mmol/L): 6.5 (calc)

## 2018-10-21 LAB — CBC
HCT: 40.4 % (ref 35.0–45.0)
Hemoglobin: 13.3 g/dL (ref 11.7–15.5)
MCH: 30.4 pg (ref 27.0–33.0)
MCHC: 32.9 g/dL (ref 32.0–36.0)
MCV: 92.2 fL (ref 80.0–100.0)
MPV: 10.2 fL (ref 7.5–12.5)
Platelets: 291 10*3/uL (ref 140–400)
RBC: 4.38 10*6/uL (ref 3.80–5.10)
RDW: 12 % (ref 11.0–15.0)
WBC: 6.6 10*3/uL (ref 3.8–10.8)

## 2018-10-21 LAB — COMPREHENSIVE METABOLIC PANEL
AG Ratio: 1.8 (calc) (ref 1.0–2.5)
ALKALINE PHOSPHATASE (APISO): 86 U/L (ref 37–153)
ALT: 8 U/L (ref 6–29)
AST: 15 U/L (ref 10–35)
Albumin: 4.6 g/dL (ref 3.6–5.1)
BUN/Creatinine Ratio: 24 (calc) — ABNORMAL HIGH (ref 6–22)
BUN: 24 mg/dL (ref 7–25)
CO2: 28 mmol/L (ref 20–32)
Calcium: 10.2 mg/dL (ref 8.6–10.4)
Chloride: 102 mmol/L (ref 98–110)
Creat: 0.98 mg/dL — ABNORMAL HIGH (ref 0.60–0.88)
Globulin: 2.6 g/dL (calc) (ref 1.9–3.7)
Glucose, Bld: 97 mg/dL (ref 65–99)
Potassium: 5.3 mmol/L (ref 3.5–5.3)
Sodium: 142 mmol/L (ref 135–146)
Total Bilirubin: 0.6 mg/dL (ref 0.2–1.2)
Total Protein: 7.2 g/dL (ref 6.1–8.1)

## 2018-10-21 LAB — LIPID PANEL
Cholesterol: 165 mg/dL (ref <200)
HDL: 45 mg/dL — ABNORMAL LOW (ref >=50)
LDL Cholesterol (Calc): 95 mg/dL (calc)
Non-HDL Cholesterol (Calc): 120 mg/dL (calc) (ref <130)
Total CHOL/HDL Ratio: 3.7 (calc) (ref <5.0)
Triglycerides: 158 mg/dL — ABNORMAL HIGH (ref <150)

## 2018-10-21 LAB — TSH: TSH: 1.93 mIU/L (ref 0.40–4.50)

## 2018-11-07 ENCOUNTER — Other Ambulatory Visit (HOSPITAL_COMMUNITY): Payer: Self-pay | Admitting: Nurse Practitioner

## 2018-11-13 ENCOUNTER — Encounter: Payer: Self-pay | Admitting: Internal Medicine

## 2018-12-16 ENCOUNTER — Ambulatory Visit (INDEPENDENT_AMBULATORY_CARE_PROVIDER_SITE_OTHER): Payer: Medicare Other | Admitting: Medical

## 2018-12-16 ENCOUNTER — Encounter: Payer: Self-pay | Admitting: Medical

## 2018-12-16 ENCOUNTER — Other Ambulatory Visit: Payer: Self-pay

## 2018-12-16 DIAGNOSIS — N390 Urinary tract infection, site not specified: Secondary | ICD-10-CM

## 2018-12-16 DIAGNOSIS — R300 Dysuria: Secondary | ICD-10-CM | POA: Diagnosis not present

## 2018-12-16 MED ORDER — CIPROFLOXACIN HCL 500 MG PO TABS: 500.0000 mg | ORAL_TABLET | Freq: Two times a day (BID) | ORAL | 0 refills | Status: DC

## 2018-12-16 NOTE — Progress Notes (Signed)
   Subjective:    Patient ID: Tracy Miles, female    DOB: 01/08/1934, 85 y.o.   MRN: [id number]  HPI  Virtual Visit via Video Note  I connected with Tracy Miles on 12/16/18 at  9:00 AM EDT by a video enabled telemedicine application and verified that I am speaking with the correct person using two identifiers.   I discussed the limitations of evaluation and management by telemedicine and the availability of in person appointments. The patient expressed understanding and agreed to proceed.   History of Present Illness:  Pt in today reporting urinary symptoms since 2 am.  Dysuria- yes Frequent urination- feels like has to urinate a lot. Will urinate some.  Hesitancy-no Suprapubic pressure-she does have pressure over the bladder. Fever-no chills-no Nausea-no Vomiting-no CVA pain-no.  History of UTI- Has had some uti in the past.(over her life 10 uti spread out over the years since youth) Gross hematuria-no    Observations/Objective: No acute distress.   Assessment and Plan: Patient seen for 3 and has symptoms of probable early UTI.  Based on the symptoms and her history I do think it is best to go ahead and give her antibiotic.  I explained to her benefit of having her come by the office to give a urinalysis and to get a culture.  However patient has concern about COVID virus and where she lives would probably require her to be quarantined for a 2weeks after leaving since her facility is in complete shutdown mode.  Nobody has left the grams per patient report.  I do want to give her strong enough antibiotic to cover various bacteria.  Patient says Cipro before without any adverse side effects.  500mg  twice daily for 3 days.  She will update me on Monday if she has any residual type signs or symptoms.  If so then explained to her that I think at that point culture would be beneficial.  Hesitant at this point to extend for 7-day course of Cipro since sometimes patients in her  age group have more side effects.  Advised patient to hydrate well and she could also get some cranberry tablets over-the-counter.  Follow-up on Monday or as needed.  Follow Up Instructions:    I discussed the assessment and treatment plan with the patient. The patient was provided an opportunity to ask questions and all were answered. The patient agreed with the plan and demonstrated an understanding of the instructions.   The patient was advised to call back or seek an in-person evaluation if the symptoms worsen or if the condition fails to improve as anticipated.  I provided 15 minutes of non-face-to-face time during this encounter.   Mackie Pai, PA-C    Review of Systems  Constitutional: Negative for chills, fatigue and fever.  Cardiovascular: Negative for chest pain and palpitations.  Gastrointestinal: Negative for abdominal distention, abdominal pain, nausea and vomiting.  Genitourinary: Positive for dysuria, frequency and urgency. Negative for difficulty urinating, dyspareunia, flank pain, pelvic pain and vaginal pain.  Musculoskeletal:       No cva pain.       Objective:   Physical Exam  No acute distress.      Assessment & Plan:

## 2018-12-16 NOTE — Patient Instructions (Signed)
Patient seen for 3 and has symptoms of probable early UTI.  Based on the symptoms and her history I do think it is best to go ahead and give her antibiotic.  I explained to her benefit of having her come by the office to give a urinalysis and to get a culture.  However patient has concern about COVID virus and where she lives would probably require her to be quarantined for a 2weeks after leaving since her facility is in complete shutdown mode.  Nobody has left the grams per patient report.  I do want to give her strong enough antibiotic to cover various bacteria.  Patient says Cipro before without any adverse side effects.  500mg  twice daily for 3 days.  She will update me on Monday if she has any residual type signs or symptoms.  If so then explained to her that I think at that point culture would be beneficial.  Hesitant at this point to extend for 7-day course of Cipro since sometimes patients in her age group have more side effects.  Advised patient to hydrate well and she could also get some cranberry tablets over-the-counter.  Follow-up on Monday or as needed.

## 2018-12-30 ENCOUNTER — Other Ambulatory Visit: Payer: Self-pay | Admitting: Family Medicine

## 2019-01-19 ENCOUNTER — Other Ambulatory Visit: Payer: Self-pay

## 2019-01-19 ENCOUNTER — Ambulatory Visit (INDEPENDENT_AMBULATORY_CARE_PROVIDER_SITE_OTHER): Payer: Medicare Other | Admitting: Family Medicine

## 2019-01-19 DIAGNOSIS — E782 Mixed hyperlipidemia: Secondary | ICD-10-CM

## 2019-01-19 DIAGNOSIS — K5909 Other constipation: Secondary | ICD-10-CM | POA: Diagnosis not present

## 2019-01-19 DIAGNOSIS — I10 Essential (primary) hypertension: Secondary | ICD-10-CM | POA: Diagnosis not present

## 2019-01-19 DIAGNOSIS — R739 Hyperglycemia, unspecified: Secondary | ICD-10-CM

## 2019-01-19 DIAGNOSIS — N3001 Acute cystitis with hematuria: Secondary | ICD-10-CM

## 2019-01-19 MED ORDER — ATORVASTATIN CALCIUM 20 MG PO TABS: ORAL_TABLET | ORAL | 2 refills | Status: DC

## 2019-01-19 MED ORDER — LISINOPRIL 10 MG PO TABS: 10.0000 mg | ORAL_TABLET | Freq: Every day | ORAL | 1 refills | Status: DC

## 2019-01-19 NOTE — Assessment & Plan Note (Signed)
Well controlled, no changes to meds. Encouraged heart healthy diet such as the DASH diet and exercise as tolerated.  °

## 2019-01-23 NOTE — Assessment & Plan Note (Signed)
Encouraged heart healthy diet, increase exercise, avoid trans fats, consider a krill oil cap daily 

## 2019-01-23 NOTE — Assessment & Plan Note (Signed)
Feeling better after treatment encouraged adeqaute hydration, cranberry tabs and probiotics daily. Given a prescription for Ciprofloxacin prn if symptoms return

## 2019-01-23 NOTE — Assessment & Plan Note (Signed)
Is doing well with the addition of psyllium and probiotics. Discussed the idea of a repeat colonoscopy but she is warned that the risk of colonoscopy increases after 82 so since she is asymptomatic. If she develops symptoms she will discuss with GI

## 2019-01-23 NOTE — Assessment & Plan Note (Signed)
hgba1c acceptable, minimize simple carbs. Increase exercise as tolerated.  

## 2019-01-23 NOTE — Progress Notes (Signed)
Virtual Visit via Video Note  I connected with Tracy Miles on 01/18/2019 at  1:40 PM EDT by a video enabled telemedicine application and verified that I am speaking with the correct person using two identifiers.  Location: Patient: home Provider: office   I discussed the limitations of evaluation and management by telemedicine and the availability of in person appointments. The patient expressed understanding and agreed to proceed. Princess Eulas Post CMA was able to get patient set up on video visit    Subjective:    Patient ID: Tracy Miles, female    DOB: Dec 05, 1933, 85 y.o.   MRN: [id number]  No chief complaint on file.   HPI Patient is in today for follow up on constipation, urinary tract infection, hyperlipidemia, hyperglycemia and more. She feels well today. No recent febrile illness or hospitalizations. She had a urinary tract infection but feels better now that she is well treated. No other acute concerns. Denies CP/palp/SOB/HA/congestion/fevers/GI or GU c/o. Taking meds as prescribed  Past Medical History:  Diagnosis Date  . Anemia   . Aortic stenosis    Very mild  . Benign paroxysmal positional vertigo 12/17/2013  . Chicken pox as a child  . Colon polyp   . Coronary artery disease   . DDD (degenerative disc disease) 11/16/2013  . Hyperlipidemia   . Hypertension   . Iron deficiency anemia 11/13/2013  . Mumps child and teenager  . OA (osteoarthritis) 11/19/2013   S/p L TKR  . Obesity   . OSA on CPAP   . Pancreatitis    post hysterectomy  . Persistent atrial fibrillation   . Personal history of colonic polyps 02/25/2014  . Personal history of DVT (deep vein thrombosis) X 2   "left"  . Sleep apnea   . Spinal stenosis     Past Surgical History:  Procedure Laterality Date  . APPENDECTOMY    . CARDIOVERSION N/A 08/09/2015   Procedure: CARDIOVERSION;  Surgeon: Jerline Pain, MD;  Location: [place];  Service: Cardiovascular;  Laterality: N/A;  . CARDIOVERSION  N/A 09/30/2018   Procedure: CARDIOVERSION;  Surgeon: [name], MD;  Location: [facility] ENDOSCOPY;  Service: Cardiovascular;  Laterality: N/A;  . CATARACT EXTRACTION W/ INTRAOCULAR LENS  IMPLANT, BILATERAL Bilateral 2016  . CYST REMOVAL HAND Bilateral 1990's   "played to much golf"  . DILATION AND CURETTAGE OF UTERUS    . LUMBAR LAMINECTOMY  40 yrs ago   "L3-4"  . MENISCUS REPAIR Bilateral 2000 and 2010  . REPLACEMENT TOTAL KNEE Left 2013  . TONSILECTOMY, ADENOIDECTOMY, BILATERAL MYRINGOTOMY AND TUBES  child  . TOTAL ABDOMINAL HYSTERECTOMY  1995   had 2 tumors- benign  . TUBAL LIGATION  85 years old    Family History  Problem Relation Age of Onset  . Heart attack Mother 4  . Hyperlipidemia Mother        ?  Marland Kitchen Dementia Mother   . Pernicious anemia Mother   . COPD Father        smoker  . Cancer Father 35       prostate  . Heart disease Brother        quadruple bipass surgery  . Hyperlipidemia Brother   . Hypertension Brother   . Diabetes Maternal Grandmother        type 2  . Pernicious anemia Maternal Grandmother   . Gout Son   . Atrial fibrillation Son   . Hyperlipidemia Son   . Cancer Maternal Grandfather  liver  . Cancer Paternal Grandmother        lung- doesn't think she smokes?  . Stroke Paternal Grandfather   . Cancer Son 50       non hodgin's lymphoma  . Gout Son   . Hyperlipidemia Son   . Sleep apnea Son   . Colon cancer Neg Hx   . Pancreatic cancer Neg Hx   . Stomach cancer Neg Hx   . Throat cancer Neg Hx   . Liver disease Neg Hx     Social History   Socioeconomic History  . Marital status: Widowed    Spouse name: Not on file  . Number of children: 3  . Years of education: Not on file  . Highest education level: Not on file  Occupational History  . Occupation: retired Tour manager  . Financial resource strain: Not on file  . Food insecurity:    Worry: Not on file    Inability: Not on file  . Transportation needs:     Medical: Not on file    Non-medical: Not on file  Tobacco Use  . Smoking status: Never Smoker  . Smokeless tobacco: Never Used  . Tobacco comment: never used tobacco  Substance and Sexual Activity  . Alcohol use: No  . Drug use: No  . Sexual activity: Never    Comment: lives at [place], low sodium diet  Lifestyle  . Physical activity:    Days per week: Not on file    Minutes per session: Not on file  . Stress: Not on file  Relationships  . Social connections:    Talks on phone: Not on file    Gets together: Not on file    Attends religious service: Not on file    Active member of club or organization: Not on file    Attends meetings of clubs or organizations: Not on file    Relationship status: Not on file  . Intimate partner violence:    Fear of current or ex partner: Not on file    Emotionally abused: Not on file    Physically abused: Not on file    Forced sexual activity: Not on file  Other Topics Concern  . Not on file  Social History Narrative  . Not on file    Outpatient Medications Prior to Visit  Medication Sig Dispense Refill  . acetaminophen (TYLENOL) 325 MG tablet Take 325 mg by mouth daily.     . bumetanide (BUMEX) 1 MG tablet TAKE ONE (1) TABLET BY MOUTH EVERY DAY 90 tablet 1  . calcium carbonate (OS-CAL) 600 MG TABS tablet Take 600 mg by mouth 2 (two) times daily with a meal.    . ciprofloxacin (CIPRO) 500 MG tablet Take 1 tablet (500 mg total) by mouth 2 (two) times daily. 6 tablet 0  . diltiazem (CARDIZEM CD) 120 MG 24 hr capsule Take 1 capsule (120 mg total) by mouth daily. 60 capsule 6  . diltiazem (CARDIZEM) 30 MG tablet Take 1 tablet (30 mg total) by mouth as directed. Take one tablet by mouth every 4 hours AS NEEDED for A-fib HR > 100 as long as BP > 100. 45 tablet 2  . dofetilide (TIKOSYN) 250 MCG capsule TAKE ONE CAPSULE BY MOUTH TWICE A DAY 60 capsule 6  . ELIQUIS 5 MG TABS tablet TAKE ONE (1) TABLET BY MOUTH TWO (2) TIMES DAILY 180 tablet 1   . lactobacillus acidophilus (BACID) TABS tablet Take 1 tablet  by mouth daily.     Marland Kitchen loratadine (CLARITIN) 10 MG tablet Take 10 mg by mouth daily.     . potassium chloride (K-DUR) 10 MEQ tablet TAKE 1 TABLET BY MOUTH IN  THE MORNING AND 2 TABLETS  IN THE EVENING (Patient taking differently: Take 10-20 mEq by mouth See admin instructions. TAKE 1 TABLET BY MOUTH IN  THE MORNING AND 2 TABLETS  IN THE EVENING) 270 tablet 2  . psyllium (REGULOID) 0.52 g capsule Take 0.52 g by mouth 2 (two) times daily.     Marland Kitchen atorvastatin (LIPITOR) 20 MG tablet TAKE 1 TABLET BY MOUTH  DAILY AT 6 PM. 90 tablet 2  . lisinopril (PRINIVIL,ZESTRIL) 10 MG tablet Take 1 tablet (10 mg total) by mouth daily. 90 tablet 1   No facility-administered medications prior to visit.      Review of Systems  Constitutional: Negative for fever and malaise/fatigue.  HENT: Negative for congestion.   Eyes: Negative for blurred vision.  Respiratory: Negative for shortness of breath.   Cardiovascular: Negative for chest pain, palpitations and leg swelling.  Gastrointestinal: Negative for abdominal pain, blood in stool and nausea.  Genitourinary: Positive for frequency and urgency. Negative for dysuria, flank pain and hematuria.  Musculoskeletal: Negative for falls.  Skin: Negative for rash.  Neurological: Negative for dizziness, loss of consciousness and headaches.  Endo/Heme/Allergies: Negative for environmental allergies.  Psychiatric/Behavioral: Negative for depression. The patient is not nervous/anxious.        Objective:    Physical Exam Constitutional:      Appearance: Normal appearance. She is not ill-appearing.  HENT:     Head: Normocephalic and atraumatic.  Eyes:     General:        Right eye: No discharge.        Left eye: No discharge.  Neck:     Musculoskeletal: Neck supple.  Cardiovascular:     Rate and Rhythm: Normal rate.  Pulmonary:     Effort: Pulmonary effort is normal.  Skin:    General: Skin is dry.   Neurological:     Mental Status: She is alert and oriented to person, place, and time.  Psychiatric:        Mood and Affect: Mood normal.        Behavior: Behavior normal.     BP 134/77 (BP Location: Left Arm, Patient Position: Sitting, Cuff Size: Normal)   Pulse 77  Wt Readings from Last 3 Encounters:  10/20/18 271 lb 12.8 oz (123.3 kg)  10/11/18 270 lb (122.5 kg)  09/29/18 274 lb (124.3 kg)    Diabetic Foot Exam - Simple   No data filed     Lab Results  Component Value Date   WBC 6.6 10/20/2018   HGB 13.3 10/20/2018   HCT 40.4 10/20/2018   PLT 291 10/20/2018   GLUCOSE 97 10/20/2018   CHOL 165 10/20/2018   TRIG 158 (H) 10/20/2018   HDL 45 (L) 10/20/2018   LDLCALC 95 10/20/2018   ALT 8 10/20/2018   AST 15 10/20/2018   NA 142 10/20/2018   K 5.3 10/20/2018   CL 102 10/20/2018   CREATININE 0.98 (H) 10/20/2018   BUN 24 10/20/2018   CO2 28 10/20/2018   TSH 1.93 10/20/2018   INR 1.55 (H) 08/06/2015   HGBA1C 5.7 (H) 10/20/2018    Lab Results  Component Value Date   TSH 1.93 10/20/2018   Lab Results  Component Value Date   WBC 6.6 10/20/2018  HGB 13.3 10/20/2018   HCT 40.4 10/20/2018   MCV 92.2 10/20/2018   PLT 291 10/20/2018   Lab Results  Component Value Date   NA 142 10/20/2018   K 5.3 10/20/2018   CO2 28 10/20/2018   GLUCOSE 97 10/20/2018   BUN 24 10/20/2018   CREATININE 0.98 (H) 10/20/2018   BILITOT 0.6 10/20/2018   ALKPHOS 83 04/05/2018   AST 15 10/20/2018   ALT 8 10/20/2018   PROT 7.2 10/20/2018   ALBUMIN 4.4 04/05/2018   CALCIUM 10.2 10/20/2018   ANIONGAP 12 09/29/2018   GFR 55.77 (L) 04/05/2018   Lab Results  Component Value Date   CHOL 165 10/20/2018   Lab Results  Component Value Date   HDL 45 (L) 10/20/2018   Lab Results  Component Value Date   LDLCALC 95 10/20/2018   Lab Results  Component Value Date   TRIG 158 (H) 10/20/2018   Lab Results  Component Value Date   CHOLHDL 3.7 10/20/2018   Lab Results  Component Value  Date   HGBA1C 5.7 (H) 10/20/2018       Assessment & Plan:   Problem List Items Addressed This Visit    Essential hypertension    Well controlled, no changes to meds. Encouraged heart healthy diet such as the DASH diet and exercise as tolerated.       Relevant Medications   lisinopril (ZESTRIL) 10 MG tablet   atorvastatin (LIPITOR) 20 MG tablet   Hyperlipidemia, mixed    Encouraged heart healthy diet, increase exercise, avoid trans fats, consider a krill oil cap daily      Relevant Medications   lisinopril (ZESTRIL) 10 MG tablet   atorvastatin (LIPITOR) 20 MG tablet   Constipation    Is doing well with the addition of psyllium and probiotics. Discussed the idea of a repeat colonoscopy but she is warned that the risk of colonoscopy increases after 80 so since she is asymptomatic. If she develops symptoms she will discuss with GI      Urinary tract infection with hematuria    Feeling better after treatment encouraged adeqaute hydration, cranberry tabs and probiotics daily. Given a prescription for Ciprofloxacin prn if symptoms return      Hyperglycemia    hgba1c acceptable, minimize simple carbs. Increase exercise as tolerated.          I have changed Tracy Miles "Tracy Miles"'s lisinopril. I am also having her maintain her calcium carbonate, loratadine, psyllium, acetaminophen, lactobacillus acidophilus, potassium chloride, diltiazem, diltiazem, bumetanide, dofetilide, ciprofloxacin, Eliquis, and atorvastatin.  Meds ordered this encounter  Medications  . lisinopril (ZESTRIL) 10 MG tablet    Sig: Take 1 tablet (10 mg total) by mouth daily.    Dispense:  90 tablet    Refill:  1  . atorvastatin (LIPITOR) 20 MG tablet    Sig: TAKE 1 TABLET BY MOUTH  DAILY AT 6 PM.    Dispense:  90 tablet    Refill:  2   I discussed the assessment and treatment plan with the patient. The patient was provided an opportunity to ask questions and all were answered. The patient agreed with the  plan and demonstrated an understanding of the instructions.   The patient was advised to call back or seek an in-person evaluation if the symptoms worsen or if the condition fails to improve as anticipated.  I provided 25 minutes of non-face-to-face time during this encounter.   [name], MD

## 2019-02-15 ENCOUNTER — Ambulatory Visit (HOSPITAL_COMMUNITY): Payer: Medicare Other | Admitting: Nurse Practitioner

## 2019-02-17 ENCOUNTER — Telehealth (HOSPITAL_COMMUNITY): Payer: Self-pay | Admitting: *Deleted

## 2019-02-17 NOTE — Telephone Encounter (Signed)
Pt called in stating she returned to AF - she has been taking cardizem PRN but her HR is still in the 130s. BP within normal range. Pt Will increase cardizem 120mg  to BID and follow up with scheduled for Monday. Pt in agreement.

## 2019-02-20 ENCOUNTER — Encounter (HOSPITAL_COMMUNITY): Payer: Self-pay | Admitting: Nurse Practitioner

## 2019-02-20 ENCOUNTER — Other Ambulatory Visit: Payer: Self-pay

## 2019-02-20 ENCOUNTER — Other Ambulatory Visit (HOSPITAL_COMMUNITY): Payer: Self-pay | Admitting: *Deleted

## 2019-02-20 ENCOUNTER — Ambulatory Visit (HOSPITAL_COMMUNITY)
Admission: RE | Admit: 2019-02-20 | Discharge: 2019-02-20 | Disposition: A | Discharge status: Home / Self Care | Payer: Medicare Other | Source: Ambulatory Visit | Attending: Nurse Practitioner | Admitting: Nurse Practitioner

## 2019-02-20 VITALS — BP 110/62 | HR 141 | Ht 63.0 in | Wt 278.0 lb

## 2019-02-20 DIAGNOSIS — Z96652 Presence of left artificial knee joint: Secondary | ICD-10-CM | POA: Diagnosis not present

## 2019-02-20 DIAGNOSIS — Z833 Family history of diabetes mellitus: Secondary | ICD-10-CM | POA: Diagnosis not present

## 2019-02-20 DIAGNOSIS — Z7901 Long term (current) use of anticoagulants: Secondary | ICD-10-CM | POA: Insufficient documentation

## 2019-02-20 DIAGNOSIS — G4733 Obstructive sleep apnea (adult) (pediatric): Secondary | ICD-10-CM | POA: Diagnosis not present

## 2019-02-20 DIAGNOSIS — Z825 Family history of asthma and other chronic lower respiratory diseases: Secondary | ICD-10-CM | POA: Insufficient documentation

## 2019-02-20 DIAGNOSIS — I10 Essential (primary) hypertension: Secondary | ICD-10-CM | POA: Insufficient documentation

## 2019-02-20 DIAGNOSIS — E785 Hyperlipidemia, unspecified: Secondary | ICD-10-CM | POA: Diagnosis not present

## 2019-02-20 DIAGNOSIS — Z6841 Body Mass Index (BMI) 40.0 and over, adult: Secondary | ICD-10-CM | POA: Diagnosis not present

## 2019-02-20 DIAGNOSIS — Z882 Allergy status to sulfonamides status: Secondary | ICD-10-CM | POA: Insufficient documentation

## 2019-02-20 DIAGNOSIS — I480 Paroxysmal atrial fibrillation: Secondary | ICD-10-CM | POA: Diagnosis present

## 2019-02-20 DIAGNOSIS — Z86718 Personal history of other venous thrombosis and embolism: Secondary | ICD-10-CM | POA: Diagnosis not present

## 2019-02-20 DIAGNOSIS — I2510 Atherosclerotic heart disease of native coronary artery without angina pectoris: Secondary | ICD-10-CM | POA: Diagnosis not present

## 2019-02-20 DIAGNOSIS — I484 Atypical atrial flutter: Secondary | ICD-10-CM | POA: Diagnosis not present

## 2019-02-20 DIAGNOSIS — Z79899 Other long term (current) drug therapy: Secondary | ICD-10-CM | POA: Diagnosis not present

## 2019-02-20 DIAGNOSIS — Z90710 Acquired absence of both cervix and uterus: Secondary | ICD-10-CM | POA: Diagnosis not present

## 2019-02-20 DIAGNOSIS — I4892 Unspecified atrial flutter: Secondary | ICD-10-CM | POA: Insufficient documentation

## 2019-02-20 DIAGNOSIS — E669 Obesity, unspecified: Secondary | ICD-10-CM | POA: Insufficient documentation

## 2019-02-20 DIAGNOSIS — Z888 Allergy status to other drugs, medicaments and biological substances status: Secondary | ICD-10-CM | POA: Diagnosis not present

## 2019-02-20 DIAGNOSIS — Z880 Allergy status to penicillin: Secondary | ICD-10-CM | POA: Insufficient documentation

## 2019-02-20 DIAGNOSIS — Z8249 Family history of ischemic heart disease and other diseases of the circulatory system: Secondary | ICD-10-CM | POA: Diagnosis not present

## 2019-02-20 LAB — BASIC METABOLIC PANEL
Anion gap: 10 (ref 5–15)
BUN: 16 mg/dL (ref 8–23)
CO2: 26 mmol/L (ref 22–32)
Calcium: 9.3 mg/dL (ref 8.9–10.3)
Chloride: 101 mmol/L (ref 98–111)
Creatinine, Ser: 1.12 mg/dL — ABNORMAL HIGH (ref 0.44–1.00)
GFR calc Af Amer: 53 mL/min — ABNORMAL LOW (ref >60)
GFR calc non Af Amer: 46 mL/min — ABNORMAL LOW (ref >60)
Glucose, Bld: 122 mg/dL — ABNORMAL HIGH (ref 70–99)
Potassium: 4.1 mmol/L (ref 3.5–5.1)
Sodium: 137 mmol/L (ref 135–145)

## 2019-02-20 LAB — CBC
HCT: 40.6 % (ref 36.0–46.0)
Hemoglobin: 13.4 g/dL (ref 12.0–15.0)
MCH: 31.2 pg (ref 26.0–34.0)
MCHC: 33 g/dL (ref 30.0–36.0)
MCV: 94.4 fL (ref 80.0–100.0)
Platelets: 277 10*3/uL (ref 150–400)
RBC: 4.3 MIL/uL (ref 3.87–5.11)
RDW: 13.1 % (ref 11.5–15.5)
WBC: 7.2 10*3/uL (ref 4.0–10.5)
nRBC: 0 % (ref 0.0–0.2)

## 2019-02-20 LAB — MAGNESIUM: Magnesium: 2.1 mg/dL (ref 1.7–2.4)

## 2019-02-20 NOTE — H&P (View-Only) (Signed)
Primary Care Physician: [name], MD Referring Physician:Dr. Rayann Heman   Tracy Miles is a 85 y.o. female with a h/o paroxysmal afib, on dofetilide, that has been in afib for several days. She knows of no trigger. Being compliant with cpap. Last cardioversion was in January. She has had a long run  of being in rhythm with tikosyn. Her v rate is 140 bpm today.Cardizem  to be  increased to BID.She is tired in afib.. She continues with RVR today. She reports one episode last week while in afib, after eating lunch, that she had chest tightness and arm weakness. She is without chest pain today. Denies chest pain any other time.    Today, she denies symptoms of palpitations, chest pain, shortness of breath, orthopnea, PND, lower extremity edema, dizziness, presyncope, syncope, or neurologic sequela. The patient is tolerating medications without difficulties and is otherwise without complaint today.   Past Medical History:  Diagnosis Date  . Anemia   . Aortic stenosis    Very mild  . Benign paroxysmal positional vertigo 12/17/2013  . Chicken pox as a child  . Colon polyp   . Coronary artery disease   . DDD (degenerative disc disease) 11/16/2013  . Hyperlipidemia   . Hypertension   . Iron deficiency anemia 11/13/2013  . Mumps child and teenager  . OA (osteoarthritis) 11/19/2013   S/p L TKR  . Obesity   . OSA on CPAP   . Pancreatitis    post hysterectomy  . Persistent atrial fibrillation   . Personal history of colonic polyps 02/25/2014  . Personal history of DVT (deep vein thrombosis) X 2   "left"  . Sleep apnea   . Spinal stenosis    Past Surgical History:  Procedure Laterality Date  . APPENDECTOMY    . CARDIOVERSION N/A 08/09/2015   Procedure: CARDIOVERSION;  Surgeon: Jerline Pain, MD;  Location: [place];  Service: Cardiovascular;  Laterality: N/A;  . CARDIOVERSION N/A 09/30/2018   Procedure: CARDIOVERSION;  Surgeon: [name], MD;  [facility] ENDOSCOPY;   Service: Cardiovascular;  Laterality: N/A;  . CATARACT EXTRACTION W/ INTRAOCULAR LENS  IMPLANT, BILATERAL Bilateral 2016  . CYST REMOVAL HAND Bilateral 1990's   "played to much golf"  . DILATION AND CURETTAGE OF UTERUS    . LUMBAR LAMINECTOMY  40 yrs ago   "L3-4"  . MENISCUS REPAIR Bilateral 2000 and 2010  . REPLACEMENT TOTAL KNEE Left 2013  . TONSILECTOMY, ADENOIDECTOMY, BILATERAL MYRINGOTOMY AND TUBES  child  . TOTAL ABDOMINAL HYSTERECTOMY  1995   had 2 tumors- benign  . TUBAL LIGATION  85 years old    Current Outpatient Medications  Medication Sig Dispense Refill  . acetaminophen (TYLENOL) 325 MG tablet Take 325 mg by mouth daily.     Marland Kitchen atorvastatin (LIPITOR) 20 MG tablet TAKE 1 TABLET BY MOUTH  DAILY AT 6 PM. 90 tablet 2  . bumetanide (BUMEX) 1 MG tablet TAKE ONE (1) TABLET BY MOUTH EVERY DAY 90 tablet 1  . calcium carbonate (OS-CAL) 600 MG TABS tablet Take 600 mg by mouth 2 (two) times daily with a meal.    . diltiazem (CARDIZEM CD) 120 MG 24 hr capsule Take 1 capsule (120 mg total) by mouth daily. 60 capsule 6  . diltiazem (CARDIZEM) 30 MG tablet Take 1 tablet (30 mg total) by mouth as directed. Take one tablet by mouth every 4 hours AS NEEDED for A-fib HR > 100 as long as BP > 100. 45 tablet  2  . dofetilide (TIKOSYN) 250 MCG capsule TAKE ONE CAPSULE BY MOUTH TWICE A DAY 60 capsule 6  . ELIQUIS 5 MG TABS tablet TAKE ONE (1) TABLET BY MOUTH TWO (2) TIMES DAILY 180 tablet 1  . lactobacillus acidophilus (BACID) TABS tablet Take 1 tablet by mouth daily.     Marland Kitchen lisinopril (ZESTRIL) 10 MG tablet Take 1 tablet (10 mg total) by mouth daily. 90 tablet 1  . loratadine (CLARITIN) 10 MG tablet Take 10 mg by mouth daily.     . potassium chloride (K-DUR) 10 MEQ tablet TAKE 1 TABLET BY MOUTH IN  THE MORNING AND 2 TABLETS  IN THE EVENING (Patient taking differently: Take 10-20 mEq by mouth See admin instructions. TAKE 1 TABLET BY MOUTH IN  THE MORNING AND 2 TABLETS  IN THE EVENING) 270 tablet 2  .  psyllium (REGULOID) 0.52 g capsule Take 0.52 g by mouth 2 (two) times daily.      No current facility-administered medications for this encounter.     Allergies  Allergen Reactions  . Gabapentin Swelling  . Lactose Intolerance (Gi) Other (See Comments)    Bothers her stomach  . Zebeta [Bisoprolol Fumarate] Nausea Only  . Penicillins Rash      . Sulfa Antibiotics Rash    Social History   Socioeconomic History  . Marital status: Widowed    Spouse name: Not on file  . Number of children: 3  . Years of education: Not on file  . Highest education level: Not on file  Occupational History  . Occupation: retired Tour manager  . Financial resource strain: Not on file  . Food insecurity    Worry: Not on file    Inability: Not on file  . Transportation needs    Medical: Not on file    Non-medical: Not on file  Tobacco Use  . Smoking status: Never Smoker  . Smokeless tobacco: Never Used  . Tobacco comment: never used tobacco  Substance and Sexual Activity  . Alcohol use: No  . Drug use: No  . Sexual activity: Never    Comment: lives [place], low sodium diet  Lifestyle  . Physical activity    Days per week: Not on file    Minutes per session: Not on file  . Stress: Not on file  Relationships  . Social Herbalist on phone: Not on file    Gets together: Not on file    Attends religious service: Not on file    Active member of club or organization: Not on file    Attends meetings of clubs or organizations: Not on file    Relationship status: Not on file  . Intimate partner violence    Fear of current or ex partner: Not on file    Emotionally abused: Not on file    Physically abused: Not on file    Forced sexual activity: Not on file  Other Topics Concern  . Not on file  Social History Narrative  . Not on file    Family History  Problem Relation Age of Onset  . Heart attack Mother 28  . Hyperlipidemia Mother        ?  Marland Kitchen Dementia  Mother   . Pernicious anemia Mother   . COPD Father        smoker  . Cancer Father 3       prostate  . Heart disease Brother  quadruple bipass surgery  . Hyperlipidemia Brother   . Hypertension Brother   . Diabetes Maternal Grandmother        type 2  . Pernicious anemia Maternal Grandmother   . Gout Son   . Atrial fibrillation Son   . Hyperlipidemia Son   . Cancer Maternal Grandfather        liver  . Cancer Paternal Grandmother        lung- doesn't think she smokes?  . Stroke Paternal Grandfather   . Cancer Son 50       non hodgin's lymphoma  . Gout Son   . Hyperlipidemia Son   . Sleep apnea Son   . Colon cancer Neg Hx   . Pancreatic cancer Neg Hx   . Stomach cancer Neg Hx   . Throat cancer Neg Hx   . Liver disease Neg Hx     ROS- All systems are reviewed and negative except as per the HPI above  Physical Exam: Vitals:   02/20/19 1448  BP: 110/62  Pulse: (!) 141  Weight: 126.1 kg  Height: 5\' 3"  (1.6 m)   Wt Readings from Last 3 Encounters:  02/20/19 126.1 kg  10/20/18 123.3 kg  10/11/18 122.5 kg    Labs: Lab Results  Component Value Date   NA 142 10/20/2018   K 5.3 10/20/2018   CL 102 10/20/2018   CO2 28 10/20/2018   GLUCOSE 97 10/20/2018   BUN 24 10/20/2018   CREATININE 0.98 (H) 10/20/2018   CALCIUM 10.2 10/20/2018   PHOS 3.7 08/23/2014   MG 2.1 09/19/2018   Lab Results  Component Value Date   INR 1.55 (H) 08/06/2015   Lab Results  Component Value Date   CHOL 165 10/20/2018   HDL 45 (L) 10/20/2018   LDLCALC 95 10/20/2018   TRIG 158 (H) 10/20/2018     GEN- The patient is well appearing, alert and oriented x 3 today.   Head- normocephalic, atraumatic Eyes-  Sclera clear, conjunctiva pink Ears- hearing intact Oropharynx- clear Neck- supple, no JVP Lymph- no cervical lymphadenopathy Lungs- Clear to ausculation bilaterally, normal work of breathing Heart- Rapid irregular rate and rhythm, no murmurs, rubs or gallops, PMI not  laterally displaced GI- soft, NT, ND, + BS Extremities- no clubbing, cyanosis, or edema MS- no significant deformity or atrophy Skin- no rash or lesion Psych- euthymic mood, full affect Neuro- strength and sensation are intact  EKG-atrial flutter at 141 bpm, qrs int 62 bpm, qtc 493 ms Epic records reviewed   Assessment and Plan: 1.  Atrial flutter  with RVR S/p successful cardioversion in January  Now back out of rhythm may be having failure of tikosyn. Has used amio in the past with SE's  Increase cardizem to 120 mg bid, go back to once daily the day before cardioversion  Continue eliquis 5 mg bid, states no missed doses for at least 3 weeks Continue dofetilide 250 mcg  Bid Will update echo and perform lexi myoview after SR is returned, looking for other factors that may be aggravating afib May send her back to Dr. Rayann Heman to see if she is an ablation candidate after echo /stress test results are known Bmet/cbc/mag today  Covid test will be required for DCCV She will call back to get scheduled as she is not aware of her ride status  2. HTN Stable  F/u in afib clinic after cardioversion  Butch Penny C. Mila [facility] [place],  [place] [address] [phone]

## 2019-02-20 NOTE — Progress Notes (Signed)
Primary Care Physician: [name], MD Referring Physician:Dr. Rayann Heman   Tracy Miles is a 85 y.o. female with a h/o paroxysmal afib, on dofetilide, that has been in afib for several days. She knows of no trigger. Being compliant with cpap. Last cardioversion was in January. She has had a long run  of being in rhythm with tikosyn. Her v rate is 140 bpm today.Cardizem  to be  increased to BID.She is tired in afib.. She continues with RVR today. She reports one episode last week while in afib, after eating lunch, that she had chest tightness and arm weakness. She is without chest pain today. Denies chest pain any other time.    Today, she denies symptoms of palpitations, chest pain, shortness of breath, orthopnea, PND, lower extremity edema, dizziness, presyncope, syncope, or neurologic sequela. The patient is tolerating medications without difficulties and is otherwise without complaint today.   Past Medical History:  Diagnosis Date  . Anemia   . Aortic stenosis    Very mild  . Benign paroxysmal positional vertigo 12/17/2013  . Chicken pox as a child  . Colon polyp   . Coronary artery disease   . DDD (degenerative disc disease) 11/16/2013  . Hyperlipidemia   . Hypertension   . Iron deficiency anemia 11/13/2013  . Mumps child and teenager  . OA (osteoarthritis) 11/19/2013   S/p L TKR  . Obesity   . OSA on CPAP   . Pancreatitis    post hysterectomy  . Persistent atrial fibrillation   . Personal history of colonic polyps 02/25/2014  . Personal history of DVT (deep vein thrombosis) X 2   "left"  . Sleep apnea   . Spinal stenosis    Past Surgical History:  Procedure Laterality Date  . APPENDECTOMY    . CARDIOVERSION N/A 08/09/2015   Procedure: CARDIOVERSION;  Surgeon: Jerline Pain, MD;  Location: [place];  Service: Cardiovascular;  Laterality: N/A;  . CARDIOVERSION N/A 09/30/2018   Procedure: CARDIOVERSION;  Surgeon: [name], MD;  Location: [facility];   Service: Cardiovascular;  Laterality: N/A;  . CATARACT EXTRACTION W/ INTRAOCULAR LENS  IMPLANT, BILATERAL Bilateral 2016  . CYST REMOVAL HAND Bilateral 1990's   "played to much golf"  . DILATION AND CURETTAGE OF UTERUS    . LUMBAR LAMINECTOMY  40 yrs ago   "L3-4"  . MENISCUS REPAIR Bilateral 2000 and 2010  . REPLACEMENT TOTAL KNEE Left 2013  . TONSILECTOMY, ADENOIDECTOMY, BILATERAL MYRINGOTOMY AND TUBES  child  . TOTAL ABDOMINAL HYSTERECTOMY  1995   had 2 tumors- benign  . TUBAL LIGATION  85 years old    Current Outpatient Medications  Medication Sig Dispense Refill  . acetaminophen (TYLENOL) 325 MG tablet Take 325 mg by mouth daily.     Marland Kitchen atorvastatin (LIPITOR) 20 MG tablet TAKE 1 TABLET BY MOUTH  DAILY AT 6 PM. 90 tablet 2  . bumetanide (BUMEX) 1 MG tablet TAKE ONE (1) TABLET BY MOUTH EVERY DAY 90 tablet 1  . calcium carbonate (OS-CAL) 600 MG TABS tablet Take 600 mg by mouth 2 (two) times daily with a meal.    . diltiazem (CARDIZEM CD) 120 MG 24 hr capsule Take 1 capsule (120 mg total) by mouth daily. 60 capsule 6  . diltiazem (CARDIZEM) 30 MG tablet Take 1 tablet (30 mg total) by mouth as directed. Take one tablet by mouth every 4 hours AS NEEDED for A-fib HR > 100 as long as BP > 100. 45 tablet  2  . dofetilide (TIKOSYN) 250 MCG capsule TAKE ONE CAPSULE BY MOUTH TWICE A DAY 60 capsule 6  . ELIQUIS 5 MG TABS tablet TAKE ONE (1) TABLET BY MOUTH TWO (2) TIMES DAILY 180 tablet 1  . lactobacillus acidophilus (BACID) TABS tablet Take 1 tablet by mouth daily.     Marland Kitchen lisinopril (ZESTRIL) 10 MG tablet Take 1 tablet (10 mg total) by mouth daily. 90 tablet 1  . loratadine (CLARITIN) 10 MG tablet Take 10 mg by mouth daily.     . potassium chloride (K-DUR) 10 MEQ tablet TAKE 1 TABLET BY MOUTH IN  THE MORNING AND 2 TABLETS  IN THE EVENING (Patient taking differently: Take 10-20 mEq by mouth See admin instructions. TAKE 1 TABLET BY MOUTH IN  THE MORNING AND 2 TABLETS  IN THE EVENING) 270 tablet 2  .  psyllium (REGULOID) 0.52 g capsule Take 0.52 g by mouth 2 (two) times daily.      No current facility-administered medications for this encounter.     Allergies  Allergen Reactions  . Gabapentin Swelling  . Lactose Intolerance (Gi) Other (See Comments)    Bothers her stomach  . Zebeta [Bisoprolol Fumarate] Nausea Only  . Penicillins Rash      . Sulfa Antibiotics Rash    Social History   Socioeconomic History  . Marital status: Widowed    Spouse name: Not on file  . Number of children: 3  . Years of education: Not on file  . Highest education level: Not on file  Occupational History  . Occupation: retired Tour manager  . Financial resource strain: Not on file  . Food insecurity    Worry: Not on file    Inability: Not on file  . Transportation needs    Medical: Not on file    Non-medical: Not on file  Tobacco Use  . Smoking status: Never Smoker  . Smokeless tobacco: Never Used  . Tobacco comment: never used tobacco  Substance and Sexual Activity  . Alcohol use: No  . Drug use: No  . Sexual activity: Never    Comment: lives [place], low sodium diet  Lifestyle  . Physical activity    Days per week: Not on file    Minutes per session: Not on file  . Stress: Not on file  Relationships  . Social Herbalist on phone: Not on file    Gets together: Not on file    Attends religious service: Not on file    Active member of club or organization: Not on file    Attends meetings of clubs or organizations: Not on file    Relationship status: Not on file  . Intimate partner violence    Fear of current or ex partner: Not on file    Emotionally abused: Not on file    Physically abused: Not on file    Forced sexual activity: Not on file  Other Topics Concern  . Not on file  Social History Narrative  . Not on file    Family History  Problem Relation Age of Onset  . Heart attack Mother 21  . Hyperlipidemia Mother        ?  Marland Kitchen Dementia  Mother   . Pernicious anemia Mother   . COPD Father        smoker  . Cancer Father 59       prostate  . Heart disease Brother  quadruple bipass surgery  . Hyperlipidemia Brother   . Hypertension Brother   . Diabetes Maternal Grandmother        type 2  . Pernicious anemia Maternal Grandmother   . Gout Son   . Atrial fibrillation Son   . Hyperlipidemia Son   . Cancer Maternal Grandfather        liver  . Cancer Paternal Grandmother        lung- doesn't think she smokes?  . Stroke Paternal Grandfather   . Cancer Son 50       non hodgin's lymphoma  . Gout Son   . Hyperlipidemia Son   . Sleep apnea Son   . Colon cancer Neg Hx   . Pancreatic cancer Neg Hx   . Stomach cancer Neg Hx   . Throat cancer Neg Hx   . Liver disease Neg Hx     ROS- All systems are reviewed and negative except as per the HPI above  Physical Exam: Vitals:   02/20/19 1448  BP: 110/62  Pulse: (!) 141  Weight: 126.1 kg  Height: 5\' 3"  (1.6 m)   Wt Readings from Last 3 Encounters:  02/20/19 126.1 kg  10/20/18 123.3 kg  10/11/18 122.5 kg    Labs: Lab Results  Component Value Date   NA 142 10/20/2018   K 5.3 10/20/2018   CL 102 10/20/2018   CO2 28 10/20/2018   GLUCOSE 97 10/20/2018   BUN 24 10/20/2018   CREATININE 0.98 (H) 10/20/2018   CALCIUM 10.2 10/20/2018   PHOS 3.7 08/23/2014   MG 2.1 09/19/2018   Lab Results  Component Value Date   INR 1.55 (H) 08/06/2015   Lab Results  Component Value Date   CHOL 165 10/20/2018   HDL 45 (L) 10/20/2018   LDLCALC 95 10/20/2018   TRIG 158 (H) 10/20/2018     GEN- The patient is well appearing, alert and oriented x 3 today.   Head- normocephalic, atraumatic Eyes-  Sclera clear, conjunctiva pink Ears- hearing intact Oropharynx- clear Neck- supple, no JVP Lymph- no cervical lymphadenopathy Lungs- Clear to ausculation bilaterally, normal work of breathing Heart- Rapid irregular rate and rhythm, no murmurs, rubs or gallops, PMI not  laterally displaced GI- soft, NT, ND, + BS Extremities- no clubbing, cyanosis, or edema MS- no significant deformity or atrophy Skin- no rash or lesion Psych- euthymic mood, full affect Neuro- strength and sensation are intact  EKG-atrial flutter at 141 bpm, qrs int 62 bpm, qtc 493 ms Epic records reviewed   Assessment and Plan: 1.  Atrial flutter  with RVR S/p successful cardioversion in January  Now back out of rhythm may be having failure of tikosyn. Has used amio in the past with SE's  Increase cardizem to 120 mg bid, go back to once daily the day before cardioversion  Continue eliquis 5 mg bid, states no missed doses for at least 3 weeks Continue dofetilide 250 mcg  Bid Will update echo and perform lexi myoview after SR is returned, looking for other factors that may be aggravating afib May send her back to Dr. Rayann Heman to see if she is an ablation candidate after echo /stress test results are known Bmet/cbc/mag today  Covid test will be required for DCCV She will call back to get scheduled as she is not aware of her ride status  2. HTN Stable  F/u in afib clinic after cardioversion  Butch Penny C. [facility] [place],  [place] [address] [phone]

## 2019-02-22 ENCOUNTER — Ambulatory Visit (HOSPITAL_COMMUNITY): Payer: Medicare Other | Admitting: Nurse Practitioner

## 2019-02-23 ENCOUNTER — Other Ambulatory Visit (HOSPITAL_COMMUNITY)
Admission: RE | Admit: 2019-02-23 | Discharge: 2019-02-23 | Disposition: A | Discharge status: Home / Self Care | Payer: Medicare Other | Source: Ambulatory Visit | Attending: Cardiology | Admitting: Cardiology

## 2019-02-23 DIAGNOSIS — Z1159 Encounter for screening for other viral diseases: Secondary | ICD-10-CM | POA: Diagnosis not present

## 2019-02-23 LAB — SARS CORONAVIRUS 2 (TAT 6-24 HRS): SARS Coronavirus 2: NEGATIVE

## 2019-02-27 ENCOUNTER — Ambulatory Visit (HOSPITAL_COMMUNITY): Payer: Medicare Other | Admitting: Registered Nurse

## 2019-02-27 ENCOUNTER — Encounter (HOSPITAL_COMMUNITY)
Admission: RE | Disposition: A | Discharge status: Home / Self Care | Payer: Self-pay | Source: Home / Self Care | Attending: Cardiology

## 2019-02-27 ENCOUNTER — Other Ambulatory Visit: Payer: Self-pay

## 2019-02-27 ENCOUNTER — Encounter (HOSPITAL_COMMUNITY): Payer: Self-pay | Admitting: *Deleted

## 2019-02-27 ENCOUNTER — Ambulatory Visit (HOSPITAL_COMMUNITY)
Admission: RE | Admit: 2019-02-27 | Discharge: 2019-02-27 | Disposition: A | Discharge status: Home / Self Care | Payer: Medicare Other | Attending: Cardiology | Admitting: Cardiology

## 2019-02-27 DIAGNOSIS — Z90710 Acquired absence of both cervix and uterus: Secondary | ICD-10-CM | POA: Insufficient documentation

## 2019-02-27 DIAGNOSIS — M1990 Unspecified osteoarthritis, unspecified site: Secondary | ICD-10-CM | POA: Insufficient documentation

## 2019-02-27 DIAGNOSIS — Z7901 Long term (current) use of anticoagulants: Secondary | ICD-10-CM | POA: Insufficient documentation

## 2019-02-27 DIAGNOSIS — I350 Nonrheumatic aortic (valve) stenosis: Secondary | ICD-10-CM | POA: Insufficient documentation

## 2019-02-27 DIAGNOSIS — Z86718 Personal history of other venous thrombosis and embolism: Secondary | ICD-10-CM | POA: Diagnosis not present

## 2019-02-27 DIAGNOSIS — G4733 Obstructive sleep apnea (adult) (pediatric): Secondary | ICD-10-CM | POA: Diagnosis not present

## 2019-02-27 DIAGNOSIS — I2510 Atherosclerotic heart disease of native coronary artery without angina pectoris: Secondary | ICD-10-CM | POA: Diagnosis not present

## 2019-02-27 DIAGNOSIS — E785 Hyperlipidemia, unspecified: Secondary | ICD-10-CM | POA: Diagnosis not present

## 2019-02-27 DIAGNOSIS — E669 Obesity, unspecified: Secondary | ICD-10-CM | POA: Insufficient documentation

## 2019-02-27 DIAGNOSIS — Z79899 Other long term (current) drug therapy: Secondary | ICD-10-CM | POA: Diagnosis not present

## 2019-02-27 DIAGNOSIS — I10 Essential (primary) hypertension: Secondary | ICD-10-CM | POA: Diagnosis not present

## 2019-02-27 DIAGNOSIS — I4892 Unspecified atrial flutter: Secondary | ICD-10-CM | POA: Diagnosis not present

## 2019-02-27 DIAGNOSIS — I480 Paroxysmal atrial fibrillation: Secondary | ICD-10-CM | POA: Diagnosis not present

## 2019-02-27 DIAGNOSIS — Z882 Allergy status to sulfonamides status: Secondary | ICD-10-CM | POA: Insufficient documentation

## 2019-02-27 DIAGNOSIS — I4891 Unspecified atrial fibrillation: Secondary | ICD-10-CM | POA: Diagnosis not present

## 2019-02-27 DIAGNOSIS — I4819 Other persistent atrial fibrillation: Secondary | ICD-10-CM | POA: Diagnosis not present

## 2019-02-27 DIAGNOSIS — K5900 Constipation, unspecified: Secondary | ICD-10-CM | POA: Diagnosis not present

## 2019-02-27 HISTORY — PX: CARDIOVERSION: SHX1299

## 2019-02-27 SURGERY — CARDIOVERSION
Anesthesia: General

## 2019-02-27 MED ORDER — PROPOFOL 10 MG/ML IV BOLUS
INTRAVENOUS | Status: DC | PRN
  Administered 2019-02-27: 70 mg via INTRAVENOUS

## 2019-02-27 MED ORDER — LIDOCAINE 2% (20 MG/ML) 5 ML SYRINGE
INTRAMUSCULAR | Status: DC | PRN
  Administered 2019-02-27: 40 mg via INTRAVENOUS

## 2019-02-27 MED ORDER — SODIUM CHLORIDE 0.9 % IV SOLN
Freq: Once | INTRAVENOUS | Status: AC
  Administered 2019-02-27: 10:00:00 via INTRAVENOUS

## 2019-02-27 NOTE — Anesthesia Procedure Notes (Signed)
Date/Time: 02/27/2019 10:02 AM Performed by: Trinna Post., CRNA Pre-anesthesia Checklist: Patient identified, Emergency Drugs available, Suction available, Patient being monitored and Timeout performed Patient Re-evaluated:Patient Re-evaluated prior to induction Oxygen Delivery Method: Ambu bag Preoxygenation: Pre-oxygenation with 100% oxygen Induction Type: IV induction Placement Confirmation: positive ETCO2

## 2019-02-27 NOTE — CV Procedure (Signed)
Procedure:   DCCV  Indication:  Symptomatic atrial fibrillation  Procedure Note:  The patient signed informed consent.  She has had had therapeutic anticoagulation with apixaban greater than 3 weeks.  Anesthesia was administered by Dr. Nyoka Cowden.  Adequate airway was maintained throughout and vital followed per protocol.  She was cardioverted x 1 with 150J of biphasic synchronized energy.  She converted to NSR.  There were no apparent complications.  The patient had normal neuro status and respiratory status post procedure with vitals stable as recorded elsewhere.    Follow up:  Has follow up with cardiology.  She will continue on current medical therapy.    Buford Dresser, MD PhD 02/27/2019 10:14 AM

## 2019-02-27 NOTE — Anesthesia Postprocedure Evaluation (Signed)
Anesthesia Post Note  Patient: Tracy Miles  Procedure(s) Performed: CARDIOVERSION (N/A )     Patient location during evaluation: Endoscopy Anesthesia Type: General Level of consciousness: awake Pain management: pain level controlled Vital Signs Assessment: post-procedure vital signs reviewed and stable Respiratory status: spontaneous breathing Cardiovascular status: stable Postop Assessment: no apparent nausea or vomiting Anesthetic complications: no    Last Vitals:  Vitals:   02/27/19 1023 02/27/19 1033  BP: 111/61 108/63  Pulse: 80 78  Resp: 19 18  Temp:    SpO2: 94% 94%    Last Pain:  Vitals:   02/27/19 1033  TempSrc:   PainSc: 0-No pain                 Aroldo Galli

## 2019-02-27 NOTE — Anesthesia Preprocedure Evaluation (Signed)
Anesthesia Evaluation  Patient identified by MRN, date of birth, ID band Patient awake    Reviewed: Allergy & Precautions, NPO status   Airway Mallampati: II  TM Distance: >3 FB     Dental   Pulmonary    breath sounds clear to auscultation       Cardiovascular hypertension, + CAD   Rhythm:Irregular Rate:Normal     Neuro/Psych    GI/Hepatic negative GI ROS, Neg liver ROS,   Endo/Other  negative endocrine ROS  Renal/GU negative Renal ROS     Musculoskeletal   Abdominal   Peds  Hematology   Anesthesia Other Findings   Reproductive/Obstetrics                             Anesthesia Physical Anesthesia Plan  ASA: III  Anesthesia Plan: General   Post-op Pain Management:    Induction: Intravenous  PONV Risk Score and Plan: Treatment may vary due to age or medical condition  Airway Management Planned: Simple Face Mask and Mask  Additional Equipment:   Intra-op Plan:   Post-operative Plan:   Informed Consent: I have reviewed the patients History and Physical, chart, labs and discussed the procedure including the risks, benefits and alternatives for the proposed anesthesia with the patient or authorized representative who has indicated his/her understanding and acceptance.       Plan Discussed with: CRNA and Anesthesiologist  Anesthesia Plan Comments:         Anesthesia Quick Evaluation

## 2019-02-27 NOTE — Interval H&P Note (Signed)
History and Physical Interval Note:  02/27/2019 9:51 AM  [name]  has presented today for surgery, with the diagnosis of AFIB.  The various methods of treatment have been discussed with the patient and family. After consideration of risks, benefits and other options for treatment, the patient has consented to  Procedure(s): CARDIOVERSION (N/A) as a surgical intervention.  The patient's history has been reviewed, patient examined, no change in status, stable for surgery.  I have reviewed the patient's chart and labs.  Questions were answered to the patient's satisfaction.     Rochele Lueck Harrell Gave

## 2019-02-27 NOTE — Transfer of Care (Signed)
Immediate Anesthesia Transfer of Care Note  Patient: Tracy Miles  Procedure(s) Performed: CARDIOVERSION (N/A )  Patient Location: PACU and Endoscopy Unit  Anesthesia Type:General  Level of Consciousness: drowsy  Airway & Oxygen Therapy: Patient Spontanous Breathing  Post-op Assessment: Report given to RN and Post -op Vital signs reviewed and stable  Post vital signs: Reviewed and stable  Last Vitals:  Vitals Value Taken Time  BP    Temp    Pulse    Resp    SpO2      Last Pain:  Vitals:   02/27/19 0945  TempSrc: Oral  PainSc: 0-No pain         Complications: No apparent anesthesia complications

## 2019-02-28 ENCOUNTER — Encounter (HOSPITAL_COMMUNITY): Payer: Self-pay | Admitting: Cardiology

## 2019-03-08 ENCOUNTER — Ambulatory Visit (HOSPITAL_COMMUNITY)
Admission: RE | Admit: 2019-03-08 | Discharge: 2019-03-08 | Disposition: A | Discharge status: Home / Self Care | Payer: Medicare Other | Source: Ambulatory Visit | Attending: Nurse Practitioner | Admitting: Nurse Practitioner

## 2019-03-08 ENCOUNTER — Other Ambulatory Visit: Payer: Self-pay

## 2019-03-08 ENCOUNTER — Encounter (HOSPITAL_COMMUNITY): Payer: Self-pay | Admitting: Nurse Practitioner

## 2019-03-08 VITALS — BP 128/64 | HR 121 | Ht 63.0 in | Wt 275.0 lb

## 2019-03-08 DIAGNOSIS — Z882 Allergy status to sulfonamides status: Secondary | ICD-10-CM | POA: Diagnosis not present

## 2019-03-08 DIAGNOSIS — I4819 Other persistent atrial fibrillation: Secondary | ICD-10-CM

## 2019-03-08 DIAGNOSIS — E669 Obesity, unspecified: Secondary | ICD-10-CM | POA: Insufficient documentation

## 2019-03-08 DIAGNOSIS — G4733 Obstructive sleep apnea (adult) (pediatric): Secondary | ICD-10-CM | POA: Insufficient documentation

## 2019-03-08 DIAGNOSIS — Z96652 Presence of left artificial knee joint: Secondary | ICD-10-CM | POA: Insufficient documentation

## 2019-03-08 DIAGNOSIS — Z79899 Other long term (current) drug therapy: Secondary | ICD-10-CM | POA: Insufficient documentation

## 2019-03-08 DIAGNOSIS — I10 Essential (primary) hypertension: Secondary | ICD-10-CM | POA: Insufficient documentation

## 2019-03-08 DIAGNOSIS — I4892 Unspecified atrial flutter: Secondary | ICD-10-CM | POA: Insufficient documentation

## 2019-03-08 DIAGNOSIS — Z86718 Personal history of other venous thrombosis and embolism: Secondary | ICD-10-CM | POA: Insufficient documentation

## 2019-03-08 DIAGNOSIS — Z7901 Long term (current) use of anticoagulants: Secondary | ICD-10-CM | POA: Insufficient documentation

## 2019-03-08 DIAGNOSIS — Z888 Allergy status to other drugs, medicaments and biological substances status: Secondary | ICD-10-CM | POA: Diagnosis not present

## 2019-03-08 DIAGNOSIS — Z880 Allergy status to penicillin: Secondary | ICD-10-CM | POA: Insufficient documentation

## 2019-03-08 DIAGNOSIS — I480 Paroxysmal atrial fibrillation: Secondary | ICD-10-CM | POA: Insufficient documentation

## 2019-03-08 DIAGNOSIS — Z825 Family history of asthma and other chronic lower respiratory diseases: Secondary | ICD-10-CM | POA: Insufficient documentation

## 2019-03-08 DIAGNOSIS — E785 Hyperlipidemia, unspecified: Secondary | ICD-10-CM | POA: Diagnosis not present

## 2019-03-08 DIAGNOSIS — Z6841 Body Mass Index (BMI) 40.0 and over, adult: Secondary | ICD-10-CM | POA: Insufficient documentation

## 2019-03-08 DIAGNOSIS — Z8249 Family history of ischemic heart disease and other diseases of the circulatory system: Secondary | ICD-10-CM | POA: Insufficient documentation

## 2019-03-08 DIAGNOSIS — I2510 Atherosclerotic heart disease of native coronary artery without angina pectoris: Secondary | ICD-10-CM | POA: Diagnosis not present

## 2019-03-08 MED ORDER — METOPROLOL SUCCINATE ER 25 MG PO TB24: 25.0000 mg | ORAL_TABLET | Freq: Every day | ORAL | 3 refills | Status: DC

## 2019-03-08 NOTE — Progress Notes (Signed)
Primary Care Physician: [name], MD Referring Physician:Dr. Rayann Heman   Tracy Miles is a 85 y.o. female with a h/o paroxysmal afib, on dofetilide, that  Is her after successful cardioversion. Unfortunately only lasted 4 days. She has RVR today in the 120's. Dofetilide had worked very well for pt for years. She had another cardioversion the first of this year.    Today, she denies symptoms of palpitations, chest pain, shortness of breath, orthopnea, PND, lower extremity edema, dizziness, presyncope, syncope, or neurologic sequela.+ fatigue. The patient is tolerating medications without difficulties and is otherwise without complaint today.   Past Medical History:  Diagnosis Date  . Anemia   . Aortic stenosis    Very mild  . Benign paroxysmal positional vertigo 12/17/2013  . Chicken pox as a child  . Colon polyp   . Coronary artery disease   . DDD (degenerative disc disease) 11/16/2013  . Hyperlipidemia   . Hypertension   . Iron deficiency anemia 11/13/2013  . Mumps child and teenager  . OA (osteoarthritis) 11/19/2013   S/p L TKR  . Obesity   . OSA on CPAP   . Pancreatitis    post hysterectomy  . Persistent atrial fibrillation   . Personal history of colonic polyps 02/25/2014  . Personal history of DVT (deep vein thrombosis) X 2   "left"  . Sleep apnea   . Spinal stenosis    Past Surgical History:  Procedure Laterality Date  . APPENDECTOMY    . CARDIOVERSION N/A 08/09/2015   Procedure: CARDIOVERSION;  Surgeon: Jerline Pain, MD;  Location: [place];  Service: Cardiovascular;  Laterality: N/A;  . CARDIOVERSION N/A 09/30/2018   Procedure: CARDIOVERSION;  Surgeon: [name], MD;  Location: [facility] ENDOSCOPY;  Service: Cardiovascular;  Laterality: N/A;  . CARDIOVERSION N/A 02/27/2019   Procedure: CARDIOVERSION;  Surgeon: Buford Dresser, MD;  Location: [facility];  Service: Cardiovascular;  Laterality: N/A;  . CATARACT EXTRACTION W/ INTRAOCULAR LENS   IMPLANT, BILATERAL Bilateral 2016  . CYST REMOVAL HAND Bilateral 1990's   "played to much golf"  . DILATION AND CURETTAGE OF UTERUS    . LUMBAR LAMINECTOMY  40 yrs ago   "L3-4"  . MENISCUS REPAIR Bilateral 2000 and 2010  . REPLACEMENT TOTAL KNEE Left 2013  . TONSILECTOMY, ADENOIDECTOMY, BILATERAL MYRINGOTOMY AND TUBES  child  . TOTAL ABDOMINAL HYSTERECTOMY  1995   had 2 tumors- benign  . TUBAL LIGATION  85 years old    Current Outpatient Medications  Medication Sig Dispense Refill  . acetaminophen (TYLENOL) 325 MG tablet Take 325 mg by mouth daily.     Marland Kitchen atorvastatin (LIPITOR) 20 MG tablet TAKE 1 TABLET BY MOUTH  DAILY AT 6 PM. 90 tablet 2  . bumetanide (BUMEX) 1 MG tablet TAKE ONE (1) TABLET BY MOUTH EVERY DAY (Patient taking differently: Take 1 mg by mouth daily. TAKE ONE (1) TABLET BY MOUTH EVERY DAY) 90 tablet 1  . calcium carbonate (OS-CAL) 600 MG TABS tablet Take 600 mg by mouth 2 (two) times daily with a meal.    . diltiazem (CARDIZEM CD) 120 MG 24 hr capsule Take 1 capsule (120 mg total) by mouth daily. 60 capsule 6  . dofetilide (TIKOSYN) 250 MCG capsule TAKE ONE CAPSULE BY MOUTH TWICE A DAY 60 capsule 6  . ELIQUIS 5 MG TABS tablet TAKE ONE (1) TABLET BY MOUTH TWO (2) TIMES DAILY 180 tablet 1  . lactobacillus acidophilus (BACID) TABS tablet Take 1 tablet by mouth daily.     Marland Kitchen  lisinopril (ZESTRIL) 10 MG tablet Take 1 tablet (10 mg total) by mouth daily. 90 tablet 1  . loratadine (CLARITIN) 10 MG tablet Take 10 mg by mouth daily.     . potassium chloride (K-DUR) 10 MEQ tablet TAKE 1 TABLET BY MOUTH IN  THE MORNING AND 2 TABLETS  IN THE EVENING (Patient taking differently: Take 10-20 mEq by mouth See admin instructions. TAKE 1 TABLET BY MOUTH IN  THE MORNING AND 2 TABLETS  IN THE EVENING) 270 tablet 2  . psyllium (REGULOID) 0.52 g capsule Take 0.52 g by mouth 2 (two) times daily.     Marland Kitchen diltiazem (CARDIZEM) 30 MG tablet Take 1 tablet (30 mg total) by mouth as directed. Take one  tablet by mouth every 4 hours AS NEEDED for A-fib HR > 100 as long as BP > 100. (Patient not taking: Reported on 03/08/2019) 45 tablet 2  . metoprolol succinate (TOPROL XL) 25 MG 24 hr tablet Take 1 tablet (25 mg total) by mouth at bedtime. 30 tablet 3   No current facility-administered medications for this encounter.     Allergies  Allergen Reactions  . Gabapentin Swelling  . Lactose Intolerance (Gi) Other (See Comments)    Bothers her stomach  . Zebeta [Bisoprolol Fumarate] Nausea Only  . Penicillins Rash      . Sulfa Antibiotics Rash    Social History   Socioeconomic History  . Marital status: Widowed    Spouse name: Not on file  . Number of children: 3  . Years of education: Not on file  . Highest education level: Not on file  Occupational History  . Occupation: retired Tour manager  . Financial resource strain: Not on file  . Food insecurity    Worry: Not on file    Inability: Not on file  . Transportation needs    Medical: Not on file    Non-medical: Not on file  Tobacco Use  . Smoking status: Never Smoker  . Smokeless tobacco: Never Used  . Tobacco comment: never used tobacco  Substance and Sexual Activity  . Alcohol use: No  . Drug use: No  . Sexual activity: Never    Comment: lives [place], low sodium diet  Lifestyle  . Physical activity    Days per week: Not on file    Minutes per session: Not on file  . Stress: Not on file  Relationships  . Social Herbalist on phone: Not on file    Gets together: Not on file    Attends religious service: Not on file    Active member of club or organization: Not on file    Attends meetings of clubs or organizations: Not on file    Relationship status: Not on file  . Intimate partner violence    Fear of current or ex partner: Not on file    Emotionally abused: Not on file    Physically abused: Not on file    Forced sexual activity: Not on file  Other Topics Concern  . Not on file   Social History Narrative  . Not on file    Family History  Problem Relation Age of Onset  . Heart attack Mother 27  . Hyperlipidemia Mother        ?  Marland Kitchen Dementia Mother   . Pernicious anemia Mother   . COPD Father        smoker  . Cancer Father 76  prostate  . Heart disease Brother        quadruple bipass surgery  . Hyperlipidemia Brother   . Hypertension Brother   . Diabetes Maternal Grandmother        type 2  . Pernicious anemia Maternal Grandmother   . Gout Son   . Atrial fibrillation Son   . Hyperlipidemia Son   . Cancer Maternal Grandfather        liver  . Cancer Paternal Grandmother        lung- doesn't think she smokes?  . Stroke Paternal Grandfather   . Cancer Son 50       non hodgin's lymphoma  . Gout Son   . Hyperlipidemia Son   . Sleep apnea Son   . Colon cancer Neg Hx   . Pancreatic cancer Neg Hx   . Stomach cancer Neg Hx   . Throat cancer Neg Hx   . Liver disease Neg Hx     ROS- All systems are reviewed and negative except as per the HPI above  Physical Exam: Vitals:   03/08/19 1442  BP: 128/64  Pulse: (!) 121  Weight: 124.7 kg  Height: 5\' 3"  (1.6 m)   Wt Readings from Last 3 Encounters:  03/08/19 124.7 kg  02/27/19 123.8 kg  02/20/19 126.1 kg    Labs: Lab Results  Component Value Date   NA 137 02/20/2019   K 4.1 02/20/2019   CL 101 02/20/2019   CO2 26 02/20/2019   GLUCOSE 122 (H) 02/20/2019   BUN 16 02/20/2019   CREATININE 1.12 (H) 02/20/2019   CALCIUM 9.3 02/20/2019   PHOS 3.7 08/23/2014   MG 2.1 02/20/2019   Lab Results  Component Value Date   INR 1.55 (H) 08/06/2015   Lab Results  Component Value Date   CHOL 165 10/20/2018   HDL 45 (L) 10/20/2018   LDLCALC 95 10/20/2018   TRIG 158 (H) 10/20/2018     GEN- The patient is well appearing, alert and oriented x 3 today.   Head- normocephalic, atraumatic Eyes-  Sclera clear, conjunctiva pink Ears- hearing intact Oropharynx- clear Neck- supple, no JVP Lymph- no  cervical lymphadenopathy Lungs- Clear to ausculation bilaterally, normal work of breathing Heart- Rapid irregular rate and rhythm, no murmurs, rubs or gallops, PMI not laterally displaced GI- soft, NT, ND, + BS Extremities- no clubbing, cyanosis, or edema MS- no significant deformity or atrophy Skin- no rash or lesion Psych- euthymic mood, full affect Neuro- strength and sensation are intact  EKG-atrial flutter at 141 bpm, qrs int 62 bpm, qtc 493 ms Epic records reviewed   Assessment and Plan: 1.  Atrial fib/flutter  with RVR S/p successful cardioversion in January and again last week but ERAF Now back out of rhythm may be having failure of tikosyn. Has used amio in the past, states didn't work. Continue cardizem   120 mg qd  Add toprol er 25 mg  In the pm Continue eliquis 5 mg bid  Continue dofetilide 250 mcg bid for now Will rate control and then  update echo and perform lexi myoview,   looking for other factors that may be aggravating afib May send her back to Dr. Rayann Heman to see if she is an ablation candidate/rechallenge amio  after echo /stress test results are known   2. HTN Stable  F/u in afib clinic on Monday to see effects of adding BB  Butch Penny C. Noach Calvillo, [facility] [place], [place]  [address] [phone]    

## 2019-03-08 NOTE — Patient Instructions (Signed)
Metoprolol 25mg  once a day with supper

## 2019-03-13 ENCOUNTER — Encounter (HOSPITAL_COMMUNITY): Payer: Self-pay | Admitting: Nurse Practitioner

## 2019-03-13 ENCOUNTER — Other Ambulatory Visit: Payer: Self-pay

## 2019-03-13 ENCOUNTER — Other Ambulatory Visit (HOSPITAL_COMMUNITY): Payer: Self-pay | Admitting: Nurse Practitioner

## 2019-03-13 ENCOUNTER — Ambulatory Visit (HOSPITAL_COMMUNITY)
Admission: RE | Admit: 2019-03-13 | Discharge: 2019-03-13 | Disposition: A | Discharge status: Home / Self Care | Payer: Medicare Other | Source: Ambulatory Visit | Attending: Nurse Practitioner | Admitting: Nurse Practitioner

## 2019-03-13 DIAGNOSIS — R0600 Dyspnea, unspecified: Secondary | ICD-10-CM

## 2019-03-13 DIAGNOSIS — I350 Nonrheumatic aortic (valve) stenosis: Secondary | ICD-10-CM | POA: Diagnosis not present

## 2019-03-13 DIAGNOSIS — E785 Hyperlipidemia, unspecified: Secondary | ICD-10-CM | POA: Insufficient documentation

## 2019-03-13 DIAGNOSIS — G4733 Obstructive sleep apnea (adult) (pediatric): Secondary | ICD-10-CM | POA: Insufficient documentation

## 2019-03-13 DIAGNOSIS — I10 Essential (primary) hypertension: Secondary | ICD-10-CM | POA: Insufficient documentation

## 2019-03-13 DIAGNOSIS — I4891 Unspecified atrial fibrillation: Secondary | ICD-10-CM | POA: Diagnosis not present

## 2019-03-13 DIAGNOSIS — Z86718 Personal history of other venous thrombosis and embolism: Secondary | ICD-10-CM | POA: Insufficient documentation

## 2019-03-13 DIAGNOSIS — M1990 Unspecified osteoarthritis, unspecified site: Secondary | ICD-10-CM | POA: Diagnosis not present

## 2019-03-13 DIAGNOSIS — R0602 Shortness of breath: Secondary | ICD-10-CM | POA: Diagnosis not present

## 2019-03-13 DIAGNOSIS — R918 Other nonspecific abnormal finding of lung field: Secondary | ICD-10-CM | POA: Diagnosis not present

## 2019-03-13 DIAGNOSIS — I2510 Atherosclerotic heart disease of native coronary artery without angina pectoris: Secondary | ICD-10-CM | POA: Diagnosis not present

## 2019-03-13 DIAGNOSIS — Z79899 Other long term (current) drug therapy: Secondary | ICD-10-CM | POA: Insufficient documentation

## 2019-03-13 DIAGNOSIS — Z7901 Long term (current) use of anticoagulants: Secondary | ICD-10-CM | POA: Insufficient documentation

## 2019-03-13 MED ORDER — AMIODARONE HCL 200 MG PO TABS: ORAL_TABLET | ORAL | 0 refills | Status: DC

## 2019-03-13 NOTE — Progress Notes (Addendum)
Primary Care Physician: [name], MD Referring Physician:Dr. Rayann Heman   Tracy Miles is a 85 y.o. female with a h/o paroxysmal afib, on dofetilide, that  Is her after successful cardioversion. Unfortunately only lasted 4 days. She has RVR today in the 120's. Dofetilide had worked very well for pt for years. She had another cardioversion the first of this year.   F/u in afib clinic, 7/13. She is back for EKG with 25 mg of Toprol added. She remains with RVR and soft BP so no room to increase BB. Discussed with pt washing out Tikosyn and staring amiodarone. She wishes to go ahead with this plan as she is symptomatic in afib, has RVR and cannot do her usual activities.  She was on amiodarone in 2015 and  eye doctor found some mild corneal scarring and she wanted to come off  as it to prevent potential side effects. CXR showed possible interstitial lung disease vrs edema in 2016. Per d/c instructions form Tikosyn admit in 2016, Dr. Rayann Heman suggested outpatient amiodarone if she did not continue SR on tikosyn.  Today, she denies symptoms of palpitations, chest pain, shortness of breath, orthopnea, PND, lower extremity edema, dizziness, presyncope, syncope, or neurologic sequela.+ fatigue/lightheadedness. The patient is tolerating medications without difficulties and is otherwise without complaint today.   Past Medical History:  Diagnosis Date  . Anemia   . Aortic stenosis    Very mild  . Benign paroxysmal positional vertigo 12/17/2013  . Chicken pox as a child  . Colon polyp   . Coronary artery disease   . DDD (degenerative disc disease) 11/16/2013  . Hyperlipidemia   . Hypertension   . Iron deficiency anemia 11/13/2013  . Mumps child and teenager  . OA (osteoarthritis) 11/19/2013   S/p L TKR  . Obesity   . OSA on CPAP   . Pancreatitis    post hysterectomy  . Persistent atrial fibrillation   . Personal history of colonic polyps 02/25/2014  . Personal history of DVT (deep vein  thrombosis) X 2   "left"  . Sleep apnea   . Spinal stenosis    Past Surgical History:  Procedure Laterality Date  . APPENDECTOMY    . CARDIOVERSION N/A 08/09/2015   Procedure: CARDIOVERSION;  Surgeon: Jerline Pain, MD;  Location: [facility];  Service: Cardiovascular;  Laterality: N/A;  . CARDIOVERSION N/A 09/30/2018   Procedure: CARDIOVERSION;  Surgeon: [name], MD;  Location: [facility] ENDOSCOPY;  Service: Cardiovascular;  Laterality: N/A;  . CARDIOVERSION N/A 02/27/2019   Procedure: CARDIOVERSION;  Surgeon: Buford Dresser, MD;  [facility];  Service: Cardiovascular;  Laterality: N/A;  . CATARACT EXTRACTION W/ INTRAOCULAR LENS  IMPLANT, BILATERAL Bilateral 2016  . CYST REMOVAL HAND Bilateral 1990's   "played to much golf"  . DILATION AND CURETTAGE OF UTERUS    . LUMBAR LAMINECTOMY  40 yrs ago   "L3-4"  . MENISCUS REPAIR Bilateral 2000 and 2010  . REPLACEMENT TOTAL KNEE Left 2013  . TONSILECTOMY, ADENOIDECTOMY, BILATERAL MYRINGOTOMY AND TUBES  child  . TOTAL ABDOMINAL HYSTERECTOMY  1995   had 2 tumors- benign  . TUBAL LIGATION  85 years old    Current Outpatient Medications  Medication Sig Dispense Refill  . acetaminophen (TYLENOL) 325 MG tablet Take 325 mg by mouth daily.     Marland Kitchen atorvastatin (LIPITOR) 20 MG tablet TAKE 1 TABLET BY MOUTH  DAILY AT 6 PM. 90 tablet 2  . bumetanide (BUMEX) 1 MG tablet TAKE ONE (1)  TABLET BY MOUTH EVERY DAY (Patient taking differently: Take 1 mg by mouth daily. TAKE ONE (1) TABLET BY MOUTH EVERY DAY) 90 tablet 1  . calcium carbonate (OS-CAL) 600 MG TABS tablet Take 600 mg by mouth 2 (two) times daily with a meal.    . diltiazem (CARDIZEM CD) 120 MG 24 hr capsule Take 1 capsule (120 mg total) by mouth daily. 60 capsule 6  . diltiazem (CARDIZEM) 30 MG tablet Take 1 tablet (30 mg total) by mouth as directed. Take one tablet by mouth every 4 hours AS NEEDED for A-fib HR > 100 as long as BP > 100. (Patient not taking: Reported on  03/08/2019) 45 tablet 2  . dofetilide (TIKOSYN) 250 MCG capsule TAKE ONE CAPSULE BY MOUTH TWICE A DAY 60 capsule 6  . ELIQUIS 5 MG TABS tablet TAKE ONE (1) TABLET BY MOUTH TWO (2) TIMES DAILY 180 tablet 1  . lactobacillus acidophilus (BACID) TABS tablet Take 1 tablet by mouth daily.     Marland Kitchen lisinopril (ZESTRIL) 10 MG tablet Take 1 tablet (10 mg total) by mouth daily. 90 tablet 1  . loratadine (CLARITIN) 10 MG tablet Take 10 mg by mouth daily.     . metoprolol succinate (TOPROL XL) 25 MG 24 hr tablet Take 1 tablet (25 mg total) by mouth at bedtime. 30 tablet 3  . potassium chloride (K-DUR) 10 MEQ tablet TAKE 1 TABLET BY MOUTH  EVERY MORNING AND 2 TABLETS BY MOUTH IN THE EVENING 270 tablet 2  . psyllium (REGULOID) 0.52 g capsule Take 0.52 g by mouth 2 (two) times daily.      No current facility-administered medications for this encounter.     Allergies  Allergen Reactions  . Gabapentin Swelling  . Lactose Intolerance (Gi) Other (See Comments)    Bothers her stomach  . Zebeta [Bisoprolol Fumarate] Nausea Only  . Penicillins Rash      . Sulfa Antibiotics Rash    Social History   Socioeconomic History  . Marital status: Widowed    Spouse name: Not on file  . Number of children: 3  . Years of education: Not on file  . Highest education level: Not on file  Occupational History  . Occupation: retired Tour manager  . Financial resource strain: Not on file  . Food insecurity    Worry: Not on file    Inability: Not on file  . Transportation needs    Medical: Not on file    Non-medical: Not on file  Tobacco Use  . Smoking status: Never Smoker  . Smokeless tobacco: Never Used  . Tobacco comment: never used tobacco  Substance and Sexual Activity  . Alcohol use: No  . Drug use: No  . Sexual activity: Never    Comment: lives at [place], low sodium diet  Lifestyle  . Physical activity    Days per week: Not on file    Minutes per session: Not on file  . Stress: Not on  file  Relationships  . Social Herbalist on phone: Not on file    Gets together: Not on file    Attends religious service: Not on file    Active member of club or organization: Not on file    Attends meetings of clubs or organizations: Not on file    Relationship status: Not on file  . Intimate partner violence    Fear of current or ex partner: Not on file    Emotionally  abused: Not on file    Physically abused: Not on file    Forced sexual activity: Not on file  Other Topics Concern  . Not on file  Social History Narrative  . Not on file    Family History  Problem Relation Age of Onset  . Heart attack Mother 73  . Hyperlipidemia Mother        ?  Marland Kitchen Dementia Mother   . Pernicious anemia Mother   . COPD Father        smoker  . Cancer Father 76       prostate  . Heart disease Brother        quadruple bipass surgery  . Hyperlipidemia Brother   . Hypertension Brother   . Diabetes Maternal Grandmother        type 2  . Pernicious anemia Maternal Grandmother   . Gout Son   . Atrial fibrillation Son   . Hyperlipidemia Son   . Cancer Maternal Grandfather        liver  . Cancer Paternal Grandmother        lung- doesn't think she smokes?  . Stroke Paternal Grandfather   . Cancer Son 50       non hodgin's lymphoma  . Gout Son   . Hyperlipidemia Son   . Sleep apnea Son   . Colon cancer Neg Hx   . Pancreatic cancer Neg Hx   . Stomach cancer Neg Hx   . Throat cancer Neg Hx   . Liver disease Neg Hx     ROS- All systems are reviewed and negative except as per the HPI above  Physical Exam: Vitals:   03/13/19 1430  BP: 108/62  Pulse: (!) 131   Wt Readings from Last 3 Encounters:  03/08/19 124.7 kg  02/27/19 123.8 kg  02/20/19 126.1 kg    Labs: Lab Results  Component Value Date   NA 137 02/20/2019   K 4.1 02/20/2019   CL 101 02/20/2019   CO2 26 02/20/2019   GLUCOSE 122 (H) 02/20/2019   BUN 16 02/20/2019   CREATININE 1.12 (H) 02/20/2019   CALCIUM  9.3 02/20/2019   PHOS 3.7 08/23/2014   MG 2.1 02/20/2019   Lab Results  Component Value Date   INR 1.55 (H) 08/06/2015   Lab Results  Component Value Date   CHOL 165 10/20/2018   HDL 45 (L) 10/20/2018   LDLCALC 95 10/20/2018   TRIG 158 (H) 10/20/2018     GEN- The patient is well appearing, alert and oriented x 3 today.   Head- normocephalic, atraumatic Eyes-  Sclera clear, conjunctiva pink Ears- hearing intact Oropharynx- clear Neck- supple, no JVP Lymph- no cervical lymphadenopathy Lungs- Clear to ausculation bilaterally, normal work of breathing Heart- Rapid irregular rate and rhythm, no murmurs, rubs or gallops, PMI not laterally displaced GI- soft, NT, ND, + BS Extremities- no clubbing, cyanosis, or edema MS- no significant deformity or atrophy Skin- no rash or lesion Psych- euthymic mood, full affect Neuro- strength and sensation are intact  EKG-atrial fib at 131  bpm, qrs int 62 bpm, qtc 440 ms Epic records reviewed CXR ordered   Assessment and Plan: 1.  Atrial fib/flutter  with RVR S/p successful cardioversion in January and again last week but ERAF Now back out of rhythm may be having failure of tikosyn. Has used amio in the past,  She states that she is willing to retry Potential side effects of drug  reviewed Will repeat cxr  today for possible interstitial  lung disease, vrs edema on cxr in 2016 TSH and cmet in chart from 10/2018 and wnl Continue cardizem  120 mg qd  Stop  toprol er 25 mg when starting amiodarone Continue eliquis 5 mg bid  Stop dofetilide and allow for 3 day wash out, she will start amiodarone 200 mg bid Friday am, will plan on cardioversion in one month Would like to  update echo and perform lexi myoview, looking for other factors that may be aggravating afib, when she is either rate controlled or back in SR She would like to avoid ablation at this point   2. HTN Stable  F/u in afib clinic in one week for EKG  Addendum-  4 pm-Chest  xray today shows:   IMPRESSION: Improved interstitial prominence in the lungs, likely improving interstitial edema. A component of underlying chronic interstitial change is likely.  Borderline heart size.  No active disease.  I will forward to get opinion from  a pulmonary colleague if it is  ok to still start amiodarone with the above xr repost. The pt has been reminded to stay on tikosyn and do not start amio until I get back to her   Addendum-7/15- Rexene Edison,  NP that has seen her in the past for OSA, looked at CXR and said changes are chronic but recommended PFT's prior to [facility] start. For now she continues on Tikosyn and BB for rate control. She will have a f/u with Tammy as well to review results.   Geroge Baseman Iver Miklas, [facility] [address] [place], [place] [address] [phone]

## 2019-03-13 NOTE — Patient Instructions (Signed)
Stop tikosyn Stop metoprolol  Start on FRI MORNING Amiodarone 200mg  twice a day with food

## 2019-03-13 NOTE — Addendum Note (Signed)
Encounter addended by: Sherran Needs, NP on: 03/13/2019 4:08 PM  Actions taken: Clinical Note Signed

## 2019-03-15 ENCOUNTER — Other Ambulatory Visit (HOSPITAL_COMMUNITY): Payer: Self-pay | Admitting: *Deleted

## 2019-03-15 DIAGNOSIS — I4819 Other persistent atrial fibrillation: Secondary | ICD-10-CM

## 2019-03-15 NOTE — Addendum Note (Signed)
Encounter addended by: Sherran Needs, NP on: 03/15/2019 11:49 AM  Actions taken: Clinical Note Signed

## 2019-03-17 ENCOUNTER — Telehealth: Payer: Self-pay | Admitting: Adult Health

## 2019-03-17 NOTE — Telephone Encounter (Signed)
Yes donna said she would refer her to [place]  Glad to see , can put on MD or APP schedule  Next week

## 2019-03-17 NOTE — Telephone Encounter (Signed)
Spoke with the pt  She states that she has been in Afib for 6-8 wks now and they are thinking of starting her on amiodarone  She states [name], NP, her cards has been following her for this and has reached out to [place] and told her that Tammy P would be calling her to discuss her recent cxr She is concerned bc she has not heard anything yet Tammy does this sounds familiar to you?  Please advise thanks

## 2019-03-17 NOTE — Telephone Encounter (Signed)
Spoke with the pt  She prefers to see TP  Appt scheduled  New address provided and covid screen neg

## 2019-03-20 ENCOUNTER — Encounter: Payer: Self-pay | Admitting: Adult Health

## 2019-03-20 ENCOUNTER — Other Ambulatory Visit: Payer: Self-pay

## 2019-03-20 ENCOUNTER — Ambulatory Visit (INDEPENDENT_AMBULATORY_CARE_PROVIDER_SITE_OTHER): Payer: Medicare Other | Admitting: Adult Health

## 2019-03-20 VITALS — BP 138/78 | HR 124 | Ht 63.0 in | Wt 274.6 lb

## 2019-03-20 DIAGNOSIS — Z79899 Other long term (current) drug therapy: Secondary | ICD-10-CM

## 2019-03-20 DIAGNOSIS — Z9189 Other specified personal risk factors, not elsewhere classified: Secondary | ICD-10-CM | POA: Diagnosis not present

## 2019-03-20 DIAGNOSIS — I5032 Chronic diastolic (congestive) heart failure: Secondary | ICD-10-CM | POA: Diagnosis not present

## 2019-03-20 DIAGNOSIS — G4733 Obstructive sleep apnea (adult) (pediatric): Secondary | ICD-10-CM

## 2019-03-20 DIAGNOSIS — R9389 Abnormal findings on diagnostic imaging of other specified body structures: Secondary | ICD-10-CM

## 2019-03-20 NOTE — Assessment & Plan Note (Signed)
Chest x-ray shows increased interstitial prominence-previous pulmonary function testing showed no airflow obstruction or restriction.  She did have a decreased diffusing capacity however may been related to her morbid obesity Since she is starting on amiodarone we will check a high-resolution CT chest to rule out underlying interstitial lung disease and repeat her pulmonary function testing since it is been 5 years along with a DLCO. She has no significant respiratory symptoms other than shortness of breath that is probably multifactorial with A. fib with RVR, diastolic dysfunction and morbid obesity with deconditioning She is a non-smoker.  Has no occupational or personal risk factors for pulmonary fibrosis.  She did have heavy secondhand smoke exposure.  Plan  Patient Instructions  Get PFTs (COVID prescreen )  Set up for high-resolution CT chest Will be in touch with results.   Great job with BIPAP  Keep up good work .  Work on weight loss Do not drive if sleepy.   Follow up with Dr. Halford Chessman and Kamyla Olejnik NP  In 4-6 months and As needed

## 2019-03-20 NOTE — Assessment & Plan Note (Signed)
Does not appear to be in overt overload.  Continue on current regimen.  Follow with cardiology as planned

## 2019-03-20 NOTE — Progress Notes (Signed)
@Patient  ID: Tracy Miles, female    DOB: Mar 13, 1934, 85 y.o.   MRN: [id number]  Chief Complaint  Patient presents with  . Follow-up    Amiodarone     Referring provider: [name], MD  HPI: 85 year old female never smoker followed for obstructive sleep apnea on BiPAP at bedtime A FIb hx    TEST/EVENTS :  PSG 03/07/15 >had trouble sleeping due to back pain (total sleep time was only 243 min ) AHI 10.4./hr (AHI during REM 74.7 /hr ) Low sat 84%. Severe PLMS.    03/20/2019 Follow up : Referral from Cardiology regarding Amiodarone  Patient presents for a follow-up visit.  She has been referred from cardiology for clearance to begin amiodarone. Patient has underlying atrial fibrillation and is followed by cardiology.  She is previously been managed on Tikosyn, Cardizem and Eliquis however is been having episodes of A. fib with RVR.  Underwent cardioversion that was initially successful however A. fib with RVR returned.  Cardiology is looking to begin amiodarone.Marland Kitchen TSH was 1.93 on 10/20/2018 She does have underlying sleep apnea.  She has excellent compliance and control in the past.  Says she is doing well on BIPAP .  Download shows excellent control with daily average usage at 8 hours.  She is 100% compliant.  Patient is on IPAP 16, EPAP 12 cm H2O.  AHI 1.3. Chest x-ray March 13, 2019 showed improved interstitial prominence in the lungs likely improving interstitial edema. Pulmonary function testing in 2015 showed no airflow obstruction or restriction.  FEV1 was 100%, ratio 83, FVC 90% no significant bronchodilator response, mid flow reversibility.  DLCO 62%.  Was on Amiodarone in past . Was stopped for unclear reason.  No significant cough . No wheezing . No known lung problems.  Has dyspnea relates it to tachycardia from A Fib . Says it makes her tired and low energy .   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 No hot tub  No occupations or hobbies exposure x 10 yr  Refinished furniture -personal occasional.     Immunization History  Administered Date(s) Administered  . Influenza, High Dose Seasonal PF 05/11/2017, 05/24/2018  . Influenza,inj,Quad PF,6+ Mos 04/30/2016  . Influenza-Unspecified 05/31/2013, 05/01/2014, 06/11/2015  . Pneumococcal Conjugate-13 04/02/2014  . Pneumococcal Polysaccharide-23 08/31/1998, 09/06/2015  . Tdap 07/31/2012    Past Medical History:  Diagnosis Date  . Anemia   . Aortic stenosis    Very mild  . Benign paroxysmal positional vertigo 12/17/2013  . Chicken pox as a child  . Colon polyp   . Coronary artery disease   . DDD (degenerative disc disease) 11/16/2013  . Hyperlipidemia   . Hypertension   . Iron deficiency anemia 11/13/2013  . Mumps child and teenager  . OA (osteoarthritis) 11/19/2013   S/p L TKR  . Obesity   . OSA on CPAP   . Pancreatitis    post hysterectomy  . Persistent atrial fibrillation   . Personal history of colonic polyps 02/25/2014  . Personal history of DVT (deep vein thrombosis) X 2   "left"  . Sleep apnea   . Spinal stenosis     Tobacco History: Social History   Tobacco Use  Smoking Status Never Smoker  Smokeless Tobacco Never Used  Tobacco Comment   never used tobacco   Counseling given: Not Answered Comment: never  used tobacco   Outpatient Medications Prior to Visit  Medication Sig Dispense Refill  . acetaminophen (TYLENOL) 325 MG tablet Take 325 mg by mouth daily.     Marland Kitchen atorvastatin (LIPITOR) 20 MG tablet TAKE 1 TABLET BY MOUTH  DAILY AT 6 PM. 90 tablet 2  . bumetanide (BUMEX) 1 MG tablet TAKE ONE (1) TABLET BY MOUTH EVERY DAY (Patient taking differently: Take 1 mg by mouth daily. TAKE ONE (1) TABLET BY MOUTH EVERY DAY) 90 tablet 1  . calcium carbonate (OS-CAL) 600 MG TABS tablet Take 600 mg by mouth 2 (two) times daily with a meal.    . diltiazem (CARDIZEM) 30 MG tablet  Take 1 tablet (30 mg total) by mouth as directed. Take one tablet by mouth every 4 hours AS NEEDED for A-fib HR > 100 as long as BP > 100. 45 tablet 2  . dofetilide (TIKOSYN) 250 MCG capsule     . ELIQUIS 5 MG TABS tablet TAKE ONE (1) TABLET BY MOUTH TWO (2) TIMES DAILY 180 tablet 1  . lactobacillus acidophilus (BACID) TABS tablet Take 1 tablet by mouth daily.     Marland Kitchen lisinopril (ZESTRIL) 10 MG tablet Take 1 tablet (10 mg total) by mouth daily. 90 tablet 1  . loratadine (CLARITIN) 10 MG tablet Take 10 mg by mouth daily.     . metoprolol tartrate (LOPRESSOR) 25 MG tablet Take 25 mg by mouth daily.    . potassium chloride (K-DUR) 10 MEQ tablet TAKE 1 TABLET BY MOUTH  EVERY MORNING AND 2 TABLETS BY MOUTH IN THE EVENING 270 tablet 2  . psyllium (REGULOID) 0.52 g capsule Take 0.52 g by mouth 2 (two) times daily.     Marland Kitchen amiodarone (PACERONE) 200 MG tablet Take 1 tablet twice a day with food for 1 month then reduce to 1 tablet a day 60 tablet 0  . diltiazem (CARDIZEM CD) 120 MG 24 hr capsule Take 1 capsule (120 mg total) by mouth daily. (Patient not taking: Reported on 03/20/2019) 60 capsule 6   No facility-administered medications prior to visit.      Review of Systems:   Constitutional:   No  weight loss, night sweats,  Fevers, chills,  +fatigue, or  lassitude.  HEENT:   No headaches,  Difficulty swallowing,  Tooth/dental problems, or  Sore throat,                No sneezing, itching, ear ache, nasal congestion, post nasal drip,   CV:  No chest pain,  Orthopnea, PND, swelling in lower extremities, anasarca, dizziness, palpitations, syncope.  + A fib w/ RVR   GI  No heartburn, indigestion, abdominal pain, nausea, vomiting, diarrhea, change in bowel habits, loss of appetite, bloody stools.   Resp: + shortness of breath with exertion or at rest.  No excess mucus, no productive cough,  No non-productive cough,  No coughing up of blood.  No change in color of mucus.  No wheezing.  No chest wall  deformity  Skin: no rash or lesions.  GU: no dysuria, change in color of urine, no urgency or frequency.  No flank pain, no hematuria   MS:  No joint pain or swelling.  No decreased range of motion.  No back pain.    Physical Exam  BP 138/78   Pulse (!) 124   Ht 5\' 3"  (1.6 m)   Wt 274 lb 9.6 oz (124.6 kg)   SpO2 98%   BMI 48.64 kg/m   GEN:  A/Ox3; pleasant , NAD, obese    HEENT:  [place]/AT,    NOSE-clear, THROAT-clear, no lesions, no postnasal drip or exudate noted.  Class 2-3 MP airway   NECK:  Supple w/ fair ROM; no JVD; normal carotid impulses w/o bruits; no thyromegaly or nodules palpated; no lymphadenopathy.    RESP  Clear  P & A; w/o, wheezes/ rales/ or rhonchi. no accessory muscle use, no dullness to percussion  CARD:  RRR, no m/r/g, tr  peripheral edema, pulses intact, no cyanosis or clubbing.  GI:   Soft & nt; nml bowel sounds; no organomegaly or masses detected.   Musco: Warm bil, no deformities or joint swelling noted.   Neuro: alert, no focal deficits noted.    Skin: Warm, no lesions or rashes    Lab Results:  CBC    Component Value Date/Time   WBC 7.2 02/20/2019 1515   RBC 4.30 02/20/2019 1515   HGB 13.4 02/20/2019 1515   HGB 12.0 01/03/2014 1051   HCT 40.6 02/20/2019 1515   HCT 36.9 01/03/2014 1051   PLT 277 02/20/2019 1515   PLT 234 01/03/2014 1051   MCV 94.4 02/20/2019 1515   MCV 89 01/03/2014 1051   MCH 31.2 02/20/2019 1515   MCHC 33.0 02/20/2019 1515   RDW 13.1 02/20/2019 1515   RDW 24.0 (H) 01/03/2014 1051   LYMPHSABS 1.3 08/05/2015 1600   LYMPHSABS 0.9 01/03/2014 1051   MONOABS 0.8 08/05/2015 1600   EOSABS 0.2 08/05/2015 1600   EOSABS 0.2 01/03/2014 1051   BASOSABS 0.0 08/05/2015 1600   BASOSABS 0.0 01/03/2014 1051    BMET    Component Value Date/Time   NA 137 02/20/2019 1515   K 4.1 02/20/2019 1515   CL 101 02/20/2019 1515   CO2 26 02/20/2019 1515   GLUCOSE 122 (H) 02/20/2019 1515   BUN 16 02/20/2019 1515   CREATININE 1.12  (H) 02/20/2019 1515   CREATININE 0.98 (H) 10/20/2018 1604   CALCIUM 9.3 02/20/2019 1515   GFRNONAA 46 (L) 02/20/2019 1515   GFRAA 53 (L) 02/20/2019 1515    BNP    Component Value Date/Time   BNP 322.3 (H) 08/05/2015 1600    ProBNP No results found for: PROBNP  Imaging: Dg Chest 2 View  Result Date: 03/13/2019 CLINICAL DATA:  Atrial fibrillation, shortness of breath. EXAM: CHEST - 2 VIEW COMPARISON:  08/05/2015 FINDINGS: Improved interstitial prominence within the lungs. Mild residual prominence persists. Heart is borderline in size. No effusions. No acute bony abnormality. IMPRESSION: Improved interstitial prominence in the lungs, likely improving interstitial edema. A component of underlying chronic interstitial change is likely. Borderline heart size. No active disease. Electronically Signed   By: Rolm Baptise M.D.   On: 03/13/2019 15:19      PFT Results Latest Ref Rng & Units 11/15/2013  FVC-Pre L 2.24  FVC-Predicted Pre % 86  FVC-Post L 2.34  FVC-Predicted Post % 90  Pre FEV1/FVC % % 79  Post FEV1/FCV % % 83  FEV1-Pre L 1.78  FEV1-Predicted Pre % 92  FEV1-Post L 1.95  DLCO UNC% % 62  DLCO COR %Predicted % 78  TLC L 4.38  TLC % Predicted % 89  RV % Predicted % 87    No results found for: NITRICOXIDE      Assessment & Plan:   Obstructive sleep apnea Excellent control and compliance No changes in BiPAP setting  Abnormal chest x-ray Chest x-ray shows increased interstitial prominence-previous pulmonary function testing showed no airflow obstruction or  restriction.  She did have a decreased diffusing capacity however may been related to her morbid obesity Since she is starting on amiodarone we will check a high-resolution CT chest to rule out underlying interstitial lung disease and repeat her pulmonary function testing since it is been 5 years along with a DLCO. She has no significant respiratory symptoms other than shortness of breath that is probably  multifactorial with A. fib with RVR, diastolic dysfunction and morbid obesity with deconditioning She is a non-smoker.  Has no occupational or personal risk factors for pulmonary fibrosis.  She did have heavy secondhand smoke exposure.  Plan  Patient Instructions  Get PFTs (COVID prescreen )  Set up for high-resolution CT chest Will be in touch with results.   Great job with BIPAP  Keep up good work .  Work on weight loss Do not drive if sleepy.   Follow up with Dr. Halford Chessman and Parrett NP  In 4-6 months and As needed        At risk for amiodarone toxicity with long term use Pulmonary screening for potential amiodarone use.  Patient has been on amiodarone several years ago.  Says that she felt she did okay on this. We will check a high-resolution CT chest and pulmonary function testing as a baseline.  If these look okay with no underlying interstitial lung disease and are stable from 2015 would most likely be a candidate to proceed with amiodarone use and would proceed with close monitoring. We will leave this to cardiology  Chronic diastolic HF (heart failure) ([place]) Does not appear to be in overt overload.  Continue on current regimen.  Follow with cardiology as planned     Rexene Edison, NP 03/20/2019

## 2019-03-20 NOTE — Assessment & Plan Note (Signed)
Pulmonary screening for potential amiodarone use.  Patient has been on amiodarone several years ago.  Says that she felt she did okay on this. We will check a high-resolution CT chest and pulmonary function testing as a baseline.  If these look okay with no underlying interstitial lung disease and are stable from 2015 would most likely be a candidate to proceed with amiodarone use and would proceed with close monitoring. We will leave this to cardiology

## 2019-03-20 NOTE — Assessment & Plan Note (Signed)
Excellent control and compliance No changes in BiPAP setting

## 2019-03-20 NOTE — Patient Instructions (Addendum)
Get PFTs (COVID prescreen )  Set up for high-resolution CT chest Will be in touch with results.   Great job with BIPAP  Keep up good work .  Work on weight loss Do not drive if sleepy.   Follow up with Dr. Halford Chessman and Giankarlo Leamer NP  In 4-6 months and As needed

## 2019-03-21 ENCOUNTER — Other Ambulatory Visit: Payer: Self-pay | Admitting: Pulmonary Disease

## 2019-03-23 ENCOUNTER — Ambulatory Visit (HOSPITAL_COMMUNITY): Payer: Medicare Other | Admitting: Nurse Practitioner

## 2019-03-28 ENCOUNTER — Other Ambulatory Visit (HOSPITAL_COMMUNITY)
Admission: RE | Admit: 2019-03-28 | Discharge: 2019-03-28 | Disposition: A | Discharge status: Home / Self Care | Payer: Medicare Other | Source: Ambulatory Visit | Attending: Pulmonary Disease | Admitting: Pulmonary Disease

## 2019-03-28 DIAGNOSIS — Z20828 Contact with and (suspected) exposure to other viral communicable diseases: Secondary | ICD-10-CM | POA: Diagnosis not present

## 2019-03-28 LAB — SARS CORONAVIRUS 2 (TAT 6-24 HRS): SARS Coronavirus 2: NEGATIVE

## 2019-03-31 ENCOUNTER — Other Ambulatory Visit: Payer: Self-pay

## 2019-03-31 ENCOUNTER — Ambulatory Visit (INDEPENDENT_AMBULATORY_CARE_PROVIDER_SITE_OTHER): Payer: Medicare Other | Admitting: Pulmonary Disease

## 2019-03-31 ENCOUNTER — Ambulatory Visit (HOSPITAL_BASED_OUTPATIENT_CLINIC_OR_DEPARTMENT_OTHER)
Admission: RE | Admit: 2019-03-31 | Discharge: 2019-03-31 | Disposition: A | Discharge status: Home / Self Care | Payer: Medicare Other | Source: Ambulatory Visit | Attending: Adult Health | Admitting: Adult Health

## 2019-03-31 DIAGNOSIS — R9389 Abnormal findings on diagnostic imaging of other specified body structures: Secondary | ICD-10-CM | POA: Diagnosis not present

## 2019-03-31 DIAGNOSIS — I2721 Secondary pulmonary arterial hypertension: Secondary | ICD-10-CM | POA: Diagnosis not present

## 2019-03-31 DIAGNOSIS — J8410 Pulmonary fibrosis, unspecified: Secondary | ICD-10-CM | POA: Diagnosis not present

## 2019-03-31 LAB — PULMONARY FUNCTION TEST
DL/VA % pred: 125 %
DL/VA: 5.13 ml/min/mmHg/L
DLCO cor % pred: 109 %
DLCO cor: 19.74 ml/min/mmHg
DLCO unc % pred: 109 %
DLCO unc: 19.74 ml/min/mmHg
FEF 25-75 Post: 1.5 L/sec
FEF 25-75 Pre: 1.01 L/sec
FEF2575-%Change-Post: 48 %
FEF2575-%Pred-Post: 119 %
FEF2575-%Pred-Pre: 80 %
FEV1-%Change-Post: 11 %
FEV1-%Pred-Post: 89 %
FEV1-%Pred-Pre: 80 %
FEV1-Post: 1.59 L
FEV1-Pre: 1.42 L
FEV1FVC-%Change-Post: 3 %
FEV1FVC-%Pred-Pre: 100 %
FEV6-%Change-Post: 7 %
FEV6-%Pred-Post: 91 %
FEV6-%Pred-Pre: 85 %
FEV6-Post: 2.08 L
FEV6-Pre: 1.93 L
FEV6FVC-%Pred-Post: 106 %
FEV6FVC-%Pred-Pre: 106 %
FVC-%Change-Post: 7 %
FVC-%Pred-Post: 86 %
FVC-%Pred-Pre: 80 %
FVC-Post: 2.08 L
FVC-Pre: 1.94 L
Post FEV1/FVC ratio: 76 %
Post FEV6/FVC ratio: 100 %
Pre FEV1/FVC ratio: 74 %
Pre FEV6/FVC Ratio: 100 %
RV % pred: 94 %
RV: 2.24 L
TLC % pred: 88 %
TLC: 4.36 L

## 2019-03-31 NOTE — Progress Notes (Signed)
PFT performed.

## 2019-04-04 ENCOUNTER — Other Ambulatory Visit (HOSPITAL_COMMUNITY): Payer: Self-pay | Admitting: *Deleted

## 2019-04-04 MED ORDER — METOPROLOL SUCCINATE ER 25 MG PO TB24: 25.0000 mg | ORAL_TABLET | Freq: Two times a day (BID) | ORAL | 3 refills | Status: DC

## 2019-04-07 ENCOUNTER — Other Ambulatory Visit (HOSPITAL_BASED_OUTPATIENT_CLINIC_OR_DEPARTMENT_OTHER): Payer: Medicare Other

## 2019-04-11 ENCOUNTER — Ambulatory Visit (INDEPENDENT_AMBULATORY_CARE_PROVIDER_SITE_OTHER): Payer: Medicare Other | Admitting: Adult Health

## 2019-04-11 ENCOUNTER — Encounter: Payer: Self-pay | Admitting: Adult Health

## 2019-04-11 ENCOUNTER — Other Ambulatory Visit: Payer: Self-pay

## 2019-04-11 VITALS — BP 114/66 | HR 113 | Temp 97.8°F | Ht 63.0 in | Wt 273.4 lb

## 2019-04-11 DIAGNOSIS — G4733 Obstructive sleep apnea (adult) (pediatric): Secondary | ICD-10-CM

## 2019-04-11 DIAGNOSIS — Z79899 Other long term (current) drug therapy: Secondary | ICD-10-CM | POA: Diagnosis not present

## 2019-04-11 DIAGNOSIS — J849 Interstitial pulmonary disease, unspecified: Secondary | ICD-10-CM | POA: Diagnosis not present

## 2019-04-11 DIAGNOSIS — J8410 Pulmonary fibrosis, unspecified: Secondary | ICD-10-CM | POA: Diagnosis not present

## 2019-04-11 DIAGNOSIS — Z9189 Other specified personal risk factors, not elsewhere classified: Secondary | ICD-10-CM

## 2019-04-11 NOTE — Patient Instructions (Addendum)
Would not use Amiodarone going forward.   Mucinex Twice daily  As needed  Cough/congestion .  Activity as tolerated.   Labs today .    Great job with BIPAP  Keep up good work .  Work on weight loss Do not drive if sleepy.   Follow up with Dr. Halford Chessman and Karsynn Deweese NP  In 4-6 months and As needed       Bronchiectasis  Bronchiectasis is a condition in which the airways in the lungs (bronchi) are damaged and widened. The condition makes it hard for the lungs to get rid of mucus, and it causes mucus to gather in the bronchi. This condition often leads to lung infections, which can make the condition worse. What are the causes? You can be born with this condition or you can develop it later in life. Common causes of this condition include:  Cystic fibrosis.  Repeated lung infections, such as pneumonia or tuberculosis.  An object or other blockage in the lungs.  Breathing in fluid, food, or other objects (aspiration).  A problem with the immune system and lung structure that is present at birth (congenital). Sometimes the cause is not known. What are the signs or symptoms? Common symptoms of this condition include:  A daily cough that brings up mucus and lasts for more than 3 weeks.  Lung infections that happen often.  Shortness of breath and wheezing.  Weakness and fatigue. How is this diagnosed? This condition is diagnosed with tests, such as:  Chest X-rays or CT scans. These are done to check for changes in the lungs.  Breathing tests. These are done to check how well your lungs are working.  A test of a sample of your saliva (sputum culture). This test is done to check for infection.  Blood tests and other tests. These are done to check for related diseases or causes. How is this treated? Treatment for this condition depends on the severity of the illness and its cause. Treatment may include:  Medicines that loosen mucus so it can be coughed up  (expectorants).  Medicines that relax the muscles of the bronchi (bronchodilators).  Antibiotic medicines to prevent or treat infection.  Physical therapy to help clear mucus from the lungs. Techniques may include: ? Postural drainage. This is when you sit or lie in certain positions so that mucus can drain by gravity. ? Chest percussion. This involves tapping the chest or back with a cupped hand. ? Chest vibration. For this therapy, a hand or special equipment vibrates your chest and back.  Surgery to remove the affected part of the lung. This may be done in severe cases. Follow these instructions at home: Medicines  Take over-the-counter and prescription medicines only as told by your health care provider.  If you were prescribed an antibiotic medicine, take it as told by your health care provider. Do not stop taking the antibiotic even if you start to feel better.  Avoid taking sedatives and antihistamines unless your health care provider tells you to take them. These medicines tend to thicken the mucus in the lungs. Managing symptoms  Perform breathing exercises or techniques to clear your lungs as told by your health care provider.  Consider using a cold steam vaporizer or humidifier in your room or home to help loosen secretions.  If you have a cough that gets worse at night, try sleeping in a semi-upright position. General instructions  Get plenty of rest.  Drink enough fluid to keep your urine clear  or pale yellow.  Stay inside when pollution and ozone levels are high.  Stay up to date with vaccinations and immunizations.  Avoid cigarette smoke and other lung irritants.  Do not use any products that contain nicotine or tobacco, such as cigarettes and e-cigarettes. If you need help quitting, ask your health care provider.  Keep all follow-up visits as told by your health care provider. This is important. Contact a health care provider if:  You cough up more sputum  than before and the sputum is yellow or green in color.  You have a fever.  You cannot control your cough and are losing sleep. Get help right away if:  You cough up blood.  You have chest pain.  You have increasing shortness of breath.  You have pain that gets worse or is not controlled with medicines.  You have a fever and your symptoms suddenly get worse. Summary  Bronchiectasis is a condition in which the airways in the lungs (bronchi) are damaged and widened. The condition makes it hard for the lungs to get rid of mucus, and it causes mucus to gather in the bronchi.  Treatment usually includes therapy to help clear mucus from the lungs.  Stay up to date with vaccinations and immunizations. This information is not intended to replace advice given to you by your health care provider. Make sure you discuss any questions you have with your health care provider. Document Released: 06/14/2007 Document Revised: 07/30/2017 Document Reviewed: 09/21/2016 Elsevier Patient Education  2020 [employer].

## 2019-04-11 NOTE — Assessment & Plan Note (Signed)
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  No identifiable culprits other than minimal Macrodantin use less than 2 weeks several years ago and 6 months use of amiodarone greater than 5 years ago. Pulmonary function testing shows normal lung function and DLCO.  Her ongoing dyspnea is suspected to be multifactorial with underlying recurrent A. fib with RVR and diastolic dysfunction, deconditioning morbid obesity. For now we will check autoimmune/connective tissue labs with RA factor, ANA, ANCA panel-suspect these will be unrevealing.  We will recheck her in 4 to 6 months and consider PFTs in 1 year. Considering her underlying subpleural fibrosis would not use amiodarone going forward.

## 2019-04-11 NOTE — Assessment & Plan Note (Signed)
Patient has mild Nonspecific subpleural fibrosis unclear etiology - would avoid Amiodarone use going forward.

## 2019-04-11 NOTE — Progress Notes (Signed)
@Patient  ID: Tracy Miles, female    DOB: 1934-05-01, 85 y.o.   MRN: [id number]  Chief Complaint  Patient presents with  . 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 Severe PLMS.  04/11/2019 Follow up :  Patient returns for a 3-week follow-up.  Patient was seen last visit after referral from cardiology for clearance for amiodarone.  Patient has underlying atrial fibrillation is followed by cardiology.  She is previously been managed on Tikosyn, Cardizem and Eliquis.  She has been having recurrent episodes of A. fib with RVR.  Underwent cardioversion that was initially successful however A. fib with RVR return.  Cardiology is looking to begin amiodarone.  TSH was normal.  Last visit chest x-ray showed improved interstitial prominence in the lungs likely improving interstitial edema.  Previous pulmonary function testing had showed no airflow obstruction or restriction.  Decreased diffusing capacity.. She was set up for a pulmonary function test that was done on March 31, 2019 that showed normal lung function with FEV1 at 89%, ratio 76, FVC 86%, positive bronchodilator response, DLCO 109%. High-resolution CT chest on July 31 showed mild basilar subpleural reticulation and groundglass, mild traction bronchiectasis.  No honeycombing consistent with a nonspecific interstitial pneumonitis or early UIP. Patient denies any cough.  Does have shortness of breath but relates it to her A. Fib and tachycardia. Says she gives out of breath easily.  On ACE Inhibitor .    She does have underlying sleep apnea that has excellent control and compliance on BiPAP.   SH :  From [place] , lived in [place] for 56 yrs  Education officer, museum, homemaker.  Second hand smoke as child  Frequent bronchitis as child and adult. (frequently long time to get over) . No official dx of Asthma .  Mild rhinitis  Previous Cat . (no bird/chickens)  No hot tub.  No recent travel. (previous international travel -nothing in last 10 yrs. ) No known autoimmune disease  No long term macrodantin. No chemo use. No methotrexate use.  On Amiodarone in past for ~ 6 months , taken off 2014 due to eye issues.  No unusual hobbies . Occasional refinish furniture  Lives alone, does light house work .  Goes to indoor pool , walks in pool .  No dysphagia or choking or GERD .     Immunization History  Administered Date(s) Administered  . Influenza, High Dose Seasonal PF 05/11/2017, 05/24/2018  . Influenza,inj,Quad PF,6+ Mos 04/30/2016  . Influenza-Unspecified 05/31/2013, 05/01/2014, 06/11/2015  . Pneumococcal Conjugate-13 04/02/2014  . Pneumococcal Polysaccharide-23 08/31/1998, 09/06/2015  . Tdap 07/31/2012    Past Medical History:  Diagnosis Date  . Anemia   . Aortic stenosis    Very mild  . Benign paroxysmal positional vertigo 12/17/2013  . Chicken pox as a child  . Colon polyp   . Coronary artery disease   . DDD (degenerative disc disease) 11/16/2013  . Hyperlipidemia   . Hypertension   . Iron deficiency anemia 11/13/2013  . Mumps child and teenager  . OA (osteoarthritis) 11/19/2013   S/p L TKR  . Obesity   .  OSA on CPAP   . Pancreatitis    post hysterectomy  . Persistent atrial fibrillation   . Personal history of colonic polyps 02/25/2014  . Personal history of DVT (deep vein thrombosis) X 2   "left"  . Sleep apnea   . Spinal stenosis     Tobacco History: Social History   Tobacco Use  Smoking Status Never Smoker  Smokeless Tobacco Never Used  Tobacco Comment   never used tobacco   Counseling given: Not Answered Comment: never used tobacco   Outpatient  Medications Prior to Visit  Medication Sig Dispense Refill  . acetaminophen (TYLENOL) 325 MG tablet Take 325 mg by mouth daily.     Marland Kitchen atorvastatin (LIPITOR) 20 MG tablet TAKE 1 TABLET BY MOUTH  DAILY AT 6 PM. 90 tablet 2  . bumetanide (BUMEX) 1 MG tablet TAKE ONE (1) TABLET BY MOUTH EVERY DAY (Patient taking differently: Take 1 mg by mouth daily. TAKE ONE (1) TABLET BY MOUTH EVERY DAY) 90 tablet 1  . calcium carbonate (OS-CAL) 600 MG TABS tablet Take 600 mg by mouth 2 (two) times daily with a meal.    . diltiazem (CARDIZEM CD) 120 MG 24 hr capsule Take 1 capsule (120 mg total) by mouth daily. 60 capsule 6  . dofetilide (TIKOSYN) 250 MCG capsule Take 250 mcg by mouth 2 (two) times daily.    Marland Kitchen ELIQUIS 5 MG TABS tablet TAKE ONE (1) TABLET BY MOUTH TWO (2) TIMES DAILY 180 tablet 1  . lactobacillus acidophilus (BACID) TABS tablet Take 1 tablet by mouth daily.     Marland Kitchen lisinopril (ZESTRIL) 10 MG tablet Take 1 tablet (10 mg total) by mouth daily. 90 tablet 1  . loratadine (CLARITIN) 10 MG tablet Take 10 mg by mouth daily.     . metoprolol succinate (TOPROL XL) 25 MG 24 hr tablet Take 1 tablet (25 mg total) by mouth 2 (two) times daily. 60 tablet 3  . potassium chloride (K-DUR) 10 MEQ tablet TAKE 1 TABLET BY MOUTH  EVERY MORNING AND 2 TABLETS BY MOUTH IN THE EVENING 270 tablet 2  . psyllium (REGULOID) 0.52 g capsule Take 0.52 g by mouth 2 (two) times daily.     Marland Kitchen diltiazem (CARDIZEM) 30 MG tablet Take 1 tablet (30 mg total) by mouth as directed. Take one tablet by mouth every 4 hours AS NEEDED for A-fib HR > 100 as long as BP > 100. (Patient not taking: Reported on 04/11/2019) 45 tablet 2   No facility-administered medications prior to visit.      Review of Systems:   Constitutional:   No  weight loss, night sweats,  Fevers, chills,  +fatigue, or  lassitude.  HEENT:   No headaches,  Difficulty swallowing,  Tooth/dental problems, or  Sore throat,                No sneezing, itching, ear ache, nasal  congestion, post nasal drip,   CV:  No chest pain,  Orthopnea, PND,   anasarca, dizziness, palpitations, syncope.   GI  No heartburn, indigestion, abdominal pain, nausea, vomiting, diarrhea, change in bowel habits, loss of appetite, bloody stools.   Resp:   No chest wall deformity  Skin: no rash or lesions.  GU: no dysuria, change in color of urine, no urgency or frequency.  No flank pain, no hematuria   MS:  No joint pain or swelling.  No decreased range of motion.  No back pain.    Physical Exam  BP  114/66 (BP Location: Left Arm, Cuff Size: Large)   Pulse (!) 113   Temp 97.8 F (36.6 C) (Oral)   Ht 5\' 3"  (1.6 m)   Wt 273 lb 6.4 oz (124 kg)   SpO2 97%   BMI 48.43 kg/m   GEN: A/Ox3; pleasant , NAD, obese    HEENT:  [place]/AT,   NOSE-clear, THROAT-clear, no lesions, no postnasal drip or exudate noted.   NECK:  Supple w/ fair ROM; no JVD; normal carotid impulses w/o bruits; no thyromegaly or nodules palpated; no lymphadenopathy.    RESP  Clear  P & A; w/o, wheezes/ rales/ or rhonchi. no accessory muscle use, no dullness to percussion  CARD:  RRR, no m/r/g, tr  peripheral edema, pulses intact, no cyanosis or clubbing.  GI:   Soft & nt; nml bowel sounds; no organomegaly or masses detected.   Musco: Warm bil, no deformities or joint swelling noted.   Neuro: alert, no focal deficits noted.    Skin: Warm, no lesions or rashes    Lab Results:  CBC    Component Value Date/Time   WBC 7.2 02/20/2019 1515   RBC 4.30 02/20/2019 1515   HGB 13.4 02/20/2019 1515   HGB 12.0 01/03/2014 1051   HCT 40.6 02/20/2019 1515   HCT 36.9 01/03/2014 1051   PLT 277 02/20/2019 1515   PLT 234 01/03/2014 1051   MCV 94.4 02/20/2019 1515   MCV 89 01/03/2014 1051   MCH 31.2 02/20/2019 1515   MCHC 33.0 02/20/2019 1515   RDW 13.1 02/20/2019 1515   RDW 24.0 (H) 01/03/2014 1051   LYMPHSABS 1.3 08/05/2015 1600   LYMPHSABS 0.9 01/03/2014 1051   MONOABS 0.8 08/05/2015 1600   EOSABS 0.2  08/05/2015 1600   EOSABS 0.2 01/03/2014 1051   BASOSABS 0.0 08/05/2015 1600   BASOSABS 0.0 01/03/2014 1051    BMET    Component Value Date/Time   NA 137 02/20/2019 1515   K 4.1 02/20/2019 1515   CL 101 02/20/2019 1515   CO2 26 02/20/2019 1515   GLUCOSE 122 (H) 02/20/2019 1515   BUN 16 02/20/2019 1515   CREATININE 1.12 (H) 02/20/2019 1515   CREATININE 0.98 (H) 10/20/2018 1604   CALCIUM 9.3 02/20/2019 1515   GFRNONAA 46 (L) 02/20/2019 1515   GFRAA 53 (L) 02/20/2019 1515    BNP    Component Value Date/Time   BNP 322.3 (H) 08/05/2015 1600    ProBNP No results found for: PROBNP  Imaging: Dg Chest 2 View  Result Date: 03/13/2019 CLINICAL DATA:  Atrial fibrillation, shortness of breath. EXAM: CHEST - 2 VIEW COMPARISON:  08/05/2015 FINDINGS: Improved interstitial prominence within the lungs. Mild residual prominence persists. Heart is borderline in size. No effusions. No acute bony abnormality. IMPRESSION: Improved interstitial prominence in the lungs, likely improving interstitial edema. A component of underlying chronic interstitial change is likely. Borderline heart size. No active disease. Electronically Signed   By: Rolm Baptise M.D.   On: 03/13/2019 15:19   Ct Chest High Resolution  Result Date: 03/31/2019 CLINICAL DATA:  Abnormal chest radiograph, amiodarone use. EXAM: CT CHEST WITHOUT CONTRAST TECHNIQUE: Multidetector CT imaging of the chest was performed following the standard protocol without intravenous contrast. High resolution imaging of the lungs, as well as inspiratory and expiratory imaging, was performed. COMPARISON:  Chest radiograph 03/13/2019. FINDINGS: Cardiovascular: Atherosclerotic calcification of the aorta, aortic valve and coronary arteries. Pulmonic trunk and heart are enlarged. No pericardial effusion. Mediastinum/Nodes: Mediastinal lymph nodes are not enlarged by  CT size criteria. Hilar regions are difficult to evaluate without IV contrast. No axillary  adenopathy. Esophagus is grossly unremarkable. Lungs/Pleura: Mild basilar subpleural reticulation and ground-glass. Associated mild traction bronchiolectasis. No honeycombing. Airway is unremarkable. No air trapping. Upper Abdomen: Visualized portion of the liver is unremarkable. Stones are seen in the gallbladder. Adrenal glands, kidneys, spleen, pancreas, stomach and bowel are grossly unremarkable. No upper abdominal adenopathy. Musculoskeletal: Degenerative changes in the spine. No worrisome lytic or sclerotic lesions. IMPRESSION: 1. Mild basilar predominant subpleural fibrosis can be seen with amiodarone use. Nonspecific interstitial pneumonitis or early/mild usual interstitial pneumonitis due to other etiologies cannot be excluded. 2. Cholelithiasis. 3. Aortic atherosclerosis (ICD10-170.0). Coronary artery calcification. 4. Enlarged pulmonic trunk, indicative of pulmonary arterial hypertension. Electronically Signed   By: Lorin Picket M.D.   On: 03/31/2019 14:31      PFT Results Latest Ref Rng & Units 03/31/2019 11/15/2013  FVC-Pre L 1.94 2.24  FVC-Predicted Pre % 80 86  FVC-Post L 2.08 2.34  FVC-Predicted Post % 86 90  Pre FEV1/FVC % % 74 79  Post FEV1/FCV % % 76 83  FEV1-Pre L 1.42 1.78  FEV1-Predicted Pre % 80 92  FEV1-Post L 1.59 1.95  DLCO UNC% % 109 62  DLCO COR %Predicted % 125 78  TLC L 4.36 4.38  TLC % Predicted % 88 89  RV % Predicted % 94 87    No results found for: NITRICOXIDE      Assessment & Plan:   Obstructive sleep apnea Controlled on BIPAP          At risk for amiodarone toxicity with long term use Patient has mild Nonspecific subpleural fibrosis unclear etiology - would avoid Amiodarone use going forward.   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  No identifiable culprits other than minimal Macrodantin use less than 2 weeks several years ago and  6 months use of amiodarone greater than 5 years ago. Pulmonary function testing shows normal lung function and DLCO.  Her ongoing dyspnea is suspected to be multifactorial with underlying recurrent A. fib with RVR and diastolic dysfunction, deconditioning morbid obesity. For now we will check autoimmune/connective tissue labs with RA factor, ANA, ANCA panel-suspect these will be unrevealing.  We will recheck her in 4 to 6 months and consider PFTs in 1 year. Considering her underlying subpleural fibrosis would not use amiodarone going forward.      Rexene Edison, NP 04/11/2019

## 2019-04-11 NOTE — Assessment & Plan Note (Signed)
Controlled on BIPAP

## 2019-04-12 ENCOUNTER — Telehealth (HOSPITAL_COMMUNITY): Payer: Self-pay | Admitting: *Deleted

## 2019-04-12 NOTE — Telephone Encounter (Signed)
-----   Message from Oliver Barre, [place] sent at 04/12/2019 10:09 AM EDT ----- Regarding: appt Hey,   Can we get a follow up for this patient with Butch Penny to talk about her amiodarone?  Thanks ----- Message ----- From: Melvenia Needles, NP Sent: 04/12/2019   9:33 AM EDT To: Sherran Needs, NP  Bethena Midget,   Saw this nice lady yesterday . I sent you my note, unfortunately do not think Amio is a great choice for her. She has some Basilar subpleural fibrosis , unclear etiology. PFT are good .   Take care  Tam

## 2019-04-12 NOTE — Telephone Encounter (Signed)
Lft msg for pt to clbk to sched appt to discuss medications.  See previous note.

## 2019-04-14 ENCOUNTER — Encounter: Payer: Self-pay | Admitting: Family Medicine

## 2019-04-14 ENCOUNTER — Ambulatory Visit (INDEPENDENT_AMBULATORY_CARE_PROVIDER_SITE_OTHER): Payer: Medicare Other | Admitting: Family Medicine

## 2019-04-14 VITALS — BP 114/66 | HR 113 | Wt 273.0 lb

## 2019-04-14 DIAGNOSIS — I10 Essential (primary) hypertension: Secondary | ICD-10-CM

## 2019-04-14 DIAGNOSIS — R739 Hyperglycemia, unspecified: Secondary | ICD-10-CM | POA: Diagnosis not present

## 2019-04-14 DIAGNOSIS — I4819 Other persistent atrial fibrillation: Secondary | ICD-10-CM

## 2019-04-14 DIAGNOSIS — G4733 Obstructive sleep apnea (adult) (pediatric): Secondary | ICD-10-CM

## 2019-04-14 DIAGNOSIS — E782 Mixed hyperlipidemia: Secondary | ICD-10-CM | POA: Diagnosis not present

## 2019-04-14 DIAGNOSIS — Z79899 Other long term (current) drug therapy: Secondary | ICD-10-CM

## 2019-04-14 DIAGNOSIS — Z9189 Other specified personal risk factors, not elsewhere classified: Secondary | ICD-10-CM

## 2019-04-14 MED ORDER — METOPROLOL SUCCINATE ER 25 MG PO TB24: 25.0000 mg | ORAL_TABLET | Freq: Three times a day (TID) | ORAL | 3 refills | Status: DC

## 2019-04-14 MED ORDER — ATORVASTATIN CALCIUM 20 MG PO TABS: ORAL_TABLET | ORAL | 2 refills | Status: DC

## 2019-04-14 MED ORDER — APIXABAN 5 MG PO TABS: ORAL_TABLET | ORAL | 1 refills | Status: DC

## 2019-04-16 NOTE — Assessment & Plan Note (Signed)
hgba1c acceptable, minimize simple carbs. Increase exercise as tolerated.  

## 2019-04-16 NOTE — Assessment & Plan Note (Signed)
Using CPAP 

## 2019-04-16 NOTE — Assessment & Plan Note (Signed)
Imaging has shown patient already has a level of pulmonary fibrosis making her not a candidate for Amiodarone.

## 2019-04-16 NOTE — Assessment & Plan Note (Signed)
Has had symptomatic Afib frequently since June. She has follow up with cardiology next week but she reports her pulse is consistently running above 110 so will have her increase her metoprolol 25 mg to tid til seen by cardiology

## 2019-04-16 NOTE — Assessment & Plan Note (Signed)
Encouraged heart healthy diet, increase exercise, avoid trans fats, tolerating Atorvasatin.

## 2019-04-16 NOTE — Progress Notes (Signed)
Virtual Visit via Video Note  I connected with Tracy Miles on 04/14/19 at  9:40 AM EDT by a video enabled telemedicine application and verified that I am speaking with the correct person using two identifiers.  Location: Patient: home Provider: home   I discussed the limitations of evaluation and management by telemedicine and the availability of in person appointments. The patient expressed understanding and agreed to proceed. Magdalene Molly, CMA was able to get patient set up on visit, video   Subjective:    Patient ID: Tracy Miles, female    DOB: [birth year], 85 y.o.   MRN: [id number]  No chief complaint on file.   HPI Patient is in today for follow up on chronic medical concerns including atrial fibrillation, hypertension, hyperlipidemia and more. She has been having midly symptomatic episodes of afib since June now. No recent febrile illness or hospitalizations. Notes she is having fatigue and palpitations. Denies CP/HA/congestion/fevers/GI or GU c/o. Taking meds as prescribed  Past Medical History:  Diagnosis Date  . Anemia   . Aortic stenosis    Very mild  . Benign paroxysmal positional vertigo 12/17/2013  . Chicken pox as a child  . Colon polyp   . Coronary artery disease   . DDD (degenerative disc disease) 11/16/2013  . Hyperlipidemia   . Hypertension   . Iron deficiency anemia 11/13/2013  . Mumps child and teenager  . OA (osteoarthritis) 11/19/2013   S/p L TKR  . Obesity   . OSA on CPAP   . Pancreatitis    post hysterectomy  . Persistent atrial fibrillation   . Personal history of colonic polyps 02/25/2014  . Personal history of DVT (deep vein thrombosis) X 2   "left"  . Sleep apnea   . Spinal stenosis     Past Surgical History:  Procedure Laterality Date  . APPENDECTOMY    . CARDIOVERSION N/A 08/09/2015   Procedure: CARDIOVERSION;  Surgeon: Jerline Pain, MD;  Location: [place];  Service: Cardiovascular;  Laterality: N/A;  . CARDIOVERSION N/A  09/30/2018   Procedure: CARDIOVERSION;  Surgeon: [name], MD;  Location: [facility] ENDOSCOPY;  Service: Cardiovascular;  Laterality: N/A;  . CARDIOVERSION N/A 02/27/2019   Procedure: CARDIOVERSION;  Surgeon: Buford Dresser, MD;  Location: [facility] ENDOSCOPY;  Service: Cardiovascular;  Laterality: N/A;  . CATARACT EXTRACTION W/ INTRAOCULAR LENS  IMPLANT, BILATERAL Bilateral 2016  . CYST REMOVAL HAND Bilateral 1990's   "played to much golf"  . DILATION AND CURETTAGE OF UTERUS    . LUMBAR LAMINECTOMY  40 yrs ago   "L3-4"  . MENISCUS REPAIR Bilateral 2000 and 2010  . REPLACEMENT TOTAL KNEE Left 2013  . TONSILECTOMY, ADENOIDECTOMY, BILATERAL MYRINGOTOMY AND TUBES  child  . TOTAL ABDOMINAL HYSTERECTOMY  1995   had 2 tumors- benign  . TUBAL LIGATION  85 years old    Family History  Problem Relation Age of Onset  . Heart attack Mother 3  . Hyperlipidemia Mother        ?  Marland Kitchen Dementia Mother   . Pernicious anemia Mother   . COPD Father        smoker  . Cancer Father 21       prostate  . Heart disease Brother        quadruple bipass surgery  . Hyperlipidemia Brother   . Hypertension Brother   . Diabetes Maternal Grandmother        type 2  . Pernicious anemia Maternal Grandmother   . Gout Son   .  Atrial fibrillation Son   . Hyperlipidemia Son   . Cancer Maternal Grandfather        liver  . Cancer Paternal Grandmother        lung- doesn't think she smokes?  . Stroke Paternal Grandfather   . Cancer Son 50       non hodgin's lymphoma  . Gout Son   . Hyperlipidemia Son   . Sleep apnea Son   . Colon cancer Neg Hx   . Pancreatic cancer Neg Hx   . Stomach cancer Neg Hx   . Throat cancer Neg Hx   . Liver disease Neg Hx     Social History   Socioeconomic History  . Marital status: Widowed    Spouse name: Not on file  . Number of children: 3  . Years of education: Not on file  . Highest education level: Not on file  Occupational History  . Occupation: retired Management consultant  . Financial resource strain: Not on file  . Food insecurity    Worry: Not on file    Inability: Not on file  . Transportation needs    Medical: Not on file    Non-medical: Not on file  Tobacco Use  . Smoking status: Never Smoker  . Smokeless tobacco: Never Used  . Tobacco comment: never used tobacco  Substance and Sexual Activity  . Alcohol use: No  . Drug use: No  . Sexual activity: Never    Comment: [place], low sodium diet  Lifestyle  . Physical activity    Days per week: Not on file    Minutes per session: Not on file  . Stress: Not on file  Relationships  . Social Herbalist on phone: Not on file    Gets together: Not on file    Attends religious service: Not on file    Active member of club or organization: Not on file    Attends meetings of clubs or organizations: Not on file    Relationship status: Not on file  . Intimate partner violence    Fear of current or ex partner: Not on file    Emotionally abused: Not on file    Physically abused: Not on file    Forced sexual activity: Not on file  Other Topics Concern  . Not on file  Social History Narrative  . Not on file    Outpatient Medications Prior to Visit  Medication Sig Dispense Refill  . acetaminophen (TYLENOL) 325 MG tablet Take 325 mg by mouth daily.     . bumetanide (BUMEX) 1 MG tablet TAKE ONE (1) TABLET BY MOUTH EVERY DAY (Patient taking differently: Take 1 mg by mouth daily. TAKE ONE (1) TABLET BY MOUTH EVERY DAY) 90 tablet 1  . calcium carbonate (OS-CAL) 600 MG TABS tablet Take 600 mg by mouth 2 (two) times daily with a meal.    . diltiazem (CARDIZEM CD) 120 MG 24 hr capsule Take 1 capsule (120 mg total) by mouth daily. 60 capsule 6  . dofetilide (TIKOSYN) 250 MCG capsule Take 250 mcg by mouth 2 (two) times daily.    Marland Kitchen lactobacillus acidophilus (BACID) TABS tablet Take 1 tablet by mouth daily.     Marland Kitchen lisinopril (ZESTRIL) 10 MG tablet Take 1 tablet (10 mg total)  by mouth daily. 90 tablet 1  . loratadine (CLARITIN) 10 MG tablet Take 10 mg by mouth daily.     . potassium chloride (  K-DUR) 10 MEQ tablet TAKE 1 TABLET BY MOUTH  EVERY MORNING AND 2 TABLETS BY MOUTH IN THE EVENING 270 tablet 2  . psyllium (REGULOID) 0.52 g capsule Take 0.52 g by mouth 2 (two) times daily.     Marland Kitchen atorvastatin (LIPITOR) 20 MG tablet TAKE 1 TABLET BY MOUTH  DAILY AT 6 PM. 90 tablet 2  . ELIQUIS 5 MG TABS tablet TAKE ONE (1) TABLET BY MOUTH TWO (2) TIMES DAILY 180 tablet 1  . metoprolol succinate (TOPROL XL) 25 MG 24 hr tablet Take 1 tablet (25 mg total) by mouth 2 (two) times daily. 60 tablet 3  . diltiazem (CARDIZEM) 30 MG tablet Take 1 tablet (30 mg total) by mouth as directed. Take one tablet by mouth every 4 hours AS NEEDED for A-fib HR > 100 as long as BP > 100. (Patient not taking: Reported on 04/11/2019) 45 tablet 2   No facility-administered medications prior to visit.      Review of Systems  Constitutional: Negative for fever and malaise/fatigue.  HENT: Negative for congestion.   Eyes: Negative for blurred vision.  Respiratory: Positive for shortness of breath.   Cardiovascular: Positive for palpitations. Negative for chest pain and leg swelling.  Gastrointestinal: Negative for abdominal pain, blood in stool and nausea.  Genitourinary: Negative for dysuria and frequency.  Musculoskeletal: Negative for falls.  Skin: Negative for rash.  Neurological: Negative for dizziness, loss of consciousness and headaches.  Endo/Heme/Allergies: Negative for environmental allergies.  Psychiatric/Behavioral: Negative for depression. The patient is not nervous/anxious.        Objective:    Physical Exam Constitutional:      Appearance: Normal appearance. She is not ill-appearing.  HENT:     Head: Normocephalic and atraumatic.     Nose: Nose normal.     Mouth/Throat:     Mouth: Mucous membranes are dry.  Pulmonary:     Effort: Pulmonary effort is normal.  Neurological:      Mental Status: She is oriented to person, place, and time.  Psychiatric:        Mood and Affect: Mood normal.        Behavior: Behavior normal.     BP 114/66 (BP Location: Left Arm, Patient Position: Sitting, Cuff Size: Normal)   Pulse (!) 113   Wt 273 lb (123.8 kg)   BMI 48.36 kg/m  Wt Readings from Last 3 Encounters:  04/14/19 273 lb (123.8 kg)  04/11/19 273 lb 6.4 oz (124 kg)  03/20/19 274 lb 9.6 oz (124.6 kg)    Diabetic Foot Exam - Simple   No data filed     Lab Results  Component Value Date   WBC 7.2 02/20/2019   HGB 13.4 02/20/2019   HCT 40.6 02/20/2019   PLT 277 02/20/2019   GLUCOSE 122 (H) 02/20/2019   CHOL 165 10/20/2018   TRIG 158 (H) 10/20/2018   HDL 45 (L) 10/20/2018   LDLCALC 95 10/20/2018   ALT 8 10/20/2018   AST 15 10/20/2018   NA 137 02/20/2019   K 4.1 02/20/2019   CL 101 02/20/2019   CREATININE 1.12 (H) 02/20/2019   BUN 16 02/20/2019   CO2 26 02/20/2019   TSH 1.93 10/20/2018   INR 1.55 (H) 08/06/2015   HGBA1C 5.7 (H) 10/20/2018    Lab Results  Component Value Date   TSH 1.93 10/20/2018   Lab Results  Component Value Date   WBC 7.2 02/20/2019   HGB 13.4 02/20/2019   HCT 40.6  02/20/2019   MCV 94.4 02/20/2019   PLT 277 02/20/2019   Lab Results  Component Value Date   NA 137 02/20/2019   K 4.1 02/20/2019   CO2 26 02/20/2019   GLUCOSE 122 (H) 02/20/2019   BUN 16 02/20/2019   CREATININE 1.12 (H) 02/20/2019   BILITOT 0.6 10/20/2018   ALKPHOS 83 04/05/2018   AST 15 10/20/2018   ALT 8 10/20/2018   PROT 7.2 10/20/2018   ALBUMIN 4.4 04/05/2018   CALCIUM 9.3 02/20/2019   ANIONGAP 10 02/20/2019   GFR 55.77 (L) 04/05/2018   Lab Results  Component Value Date   CHOL 165 10/20/2018   Lab Results  Component Value Date   HDL 45 (L) 10/20/2018   Lab Results  Component Value Date   LDLCALC 95 10/20/2018   Lab Results  Component Value Date   TRIG 158 (H) 10/20/2018   Lab Results  Component Value Date   CHOLHDL 3.7 10/20/2018    Lab Results  Component Value Date   HGBA1C 5.7 (H) 10/20/2018       Assessment & Plan:   Problem List Items Addressed This Visit    Atrial fibrillation ([place])    Has had symptomatic Afib frequently since June. She has follow up with cardiology next week but she reports her pulse is consistently running above 110 so will have her increase her metoprolol 25 mg to tid til seen by cardiology      Relevant Medications   apixaban (ELIQUIS) 5 MG TABS tablet   atorvastatin (LIPITOR) 20 MG tablet   metoprolol succinate (TOPROL XL) 25 MG 24 hr tablet   Essential hypertension - Primary   Relevant Medications   apixaban (ELIQUIS) 5 MG TABS tablet   atorvastatin (LIPITOR) 20 MG tablet   metoprolol succinate (TOPROL XL) 25 MG 24 hr tablet   Other Relevant Orders   CBC   Comprehensive metabolic panel   TSH   Hyperlipidemia, mixed    Encouraged heart healthy diet, increase exercise, avoid trans fats, tolerating Atorvasatin.      Relevant Medications   apixaban (ELIQUIS) 5 MG TABS tablet   atorvastatin (LIPITOR) 20 MG tablet   metoprolol succinate (TOPROL XL) 25 MG 24 hr tablet   Other Relevant Orders   Lipid panel   Obstructive sleep apnea    Using CPAP      Hyperglycemia    hgba1c acceptable, minimize simple carbs. Increase exercise as tolerated.       Relevant Orders   Hemoglobin A1c   At risk for amiodarone toxicity with long term use    Imaging has shown patient already has a level of pulmonary fibrosis making her not a candidate for Amiodarone.         I have changed Tracy Miles "Betty"'s Eliquis to apixaban. I have also changed her metoprolol succinate. I am also having her maintain her calcium carbonate, loratadine, psyllium, acetaminophen, lactobacillus acidophilus, diltiazem, bumetanide, lisinopril, potassium chloride, dofetilide, and atorvastatin.  Meds ordered this encounter  Medications  . apixaban (ELIQUIS) 5 MG TABS tablet    Sig: TAKE ONE (1) TABLET BY  MOUTH TWO (2) TIMES DAILY    Dispense:  180 tablet    Refill:  1  . atorvastatin (LIPITOR) 20 MG tablet    Sig: TAKE 1 TABLET BY MOUTH  DAILY AT 6 PM.    Dispense:  90 tablet    Refill:  2  . metoprolol succinate (TOPROL XL) 25 MG 24 hr tablet    Sig:  Take 1 tablet (25 mg total) by mouth 3 (three) times daily.    Dispense:  60 tablet    Refill:  3    Dose increase     I discussed the assessment and treatment plan with the patient. The patient was provided an opportunity to ask questions and all were answered. The patient agreed with the plan and demonstrated an understanding of the instructions.   The patient was advised to call back or seek an in-person evaluation if the symptoms worsen or if the condition fails to improve as anticipated.  I provided 25 minutes of non-face-to-face time during this encounter.   [name], MD

## 2019-04-19 ENCOUNTER — Ambulatory Visit (HOSPITAL_BASED_OUTPATIENT_CLINIC_OR_DEPARTMENT_OTHER)
Admission: RE | Admit: 2019-04-19 | Discharge: 2019-04-19 | Disposition: A | Discharge status: Home / Self Care | Payer: Medicare Other | Source: Ambulatory Visit | Attending: Nurse Practitioner | Admitting: Nurse Practitioner

## 2019-04-19 ENCOUNTER — Ambulatory Visit (HOSPITAL_COMMUNITY)
Admission: RE | Admit: 2019-04-19 | Discharge: 2019-04-19 | Disposition: A | Discharge status: Home / Self Care | Payer: Medicare Other | Source: Ambulatory Visit | Attending: Nurse Practitioner | Admitting: Nurse Practitioner

## 2019-04-19 ENCOUNTER — Encounter (HOSPITAL_COMMUNITY): Payer: Self-pay | Admitting: Nurse Practitioner

## 2019-04-19 ENCOUNTER — Other Ambulatory Visit: Payer: Self-pay

## 2019-04-19 ENCOUNTER — Other Ambulatory Visit (HOSPITAL_COMMUNITY): Payer: Self-pay | Admitting: *Deleted

## 2019-04-19 VITALS — BP 140/82 | HR 131 | Ht 63.0 in | Wt 275.6 lb

## 2019-04-19 DIAGNOSIS — Z9842 Cataract extraction status, left eye: Secondary | ICD-10-CM | POA: Diagnosis not present

## 2019-04-19 DIAGNOSIS — Z96652 Presence of left artificial knee joint: Secondary | ICD-10-CM | POA: Diagnosis not present

## 2019-04-19 DIAGNOSIS — Z9841 Cataract extraction status, right eye: Secondary | ICD-10-CM | POA: Insufficient documentation

## 2019-04-19 DIAGNOSIS — I480 Paroxysmal atrial fibrillation: Secondary | ICD-10-CM | POA: Diagnosis not present

## 2019-04-19 DIAGNOSIS — I4892 Unspecified atrial flutter: Secondary | ICD-10-CM | POA: Insufficient documentation

## 2019-04-19 DIAGNOSIS — Z7901 Long term (current) use of anticoagulants: Secondary | ICD-10-CM | POA: Insufficient documentation

## 2019-04-19 DIAGNOSIS — I10 Essential (primary) hypertension: Secondary | ICD-10-CM | POA: Diagnosis not present

## 2019-04-19 DIAGNOSIS — Z881 Allergy status to other antibiotic agents status: Secondary | ICD-10-CM | POA: Diagnosis not present

## 2019-04-19 DIAGNOSIS — Z8249 Family history of ischemic heart disease and other diseases of the circulatory system: Secondary | ICD-10-CM | POA: Diagnosis not present

## 2019-04-19 DIAGNOSIS — I4819 Other persistent atrial fibrillation: Secondary | ICD-10-CM

## 2019-04-19 DIAGNOSIS — Z825 Family history of asthma and other chronic lower respiratory diseases: Secondary | ICD-10-CM | POA: Diagnosis not present

## 2019-04-19 DIAGNOSIS — I2510 Atherosclerotic heart disease of native coronary artery without angina pectoris: Secondary | ICD-10-CM | POA: Insufficient documentation

## 2019-04-19 DIAGNOSIS — Z6841 Body Mass Index (BMI) 40.0 and over, adult: Secondary | ICD-10-CM | POA: Diagnosis not present

## 2019-04-19 DIAGNOSIS — G4733 Obstructive sleep apnea (adult) (pediatric): Secondary | ICD-10-CM | POA: Insufficient documentation

## 2019-04-19 DIAGNOSIS — Z86718 Personal history of other venous thrombosis and embolism: Secondary | ICD-10-CM | POA: Insufficient documentation

## 2019-04-19 DIAGNOSIS — Z888 Allergy status to other drugs, medicaments and biological substances status: Secondary | ICD-10-CM | POA: Insufficient documentation

## 2019-04-19 DIAGNOSIS — E669 Obesity, unspecified: Secondary | ICD-10-CM | POA: Diagnosis not present

## 2019-04-19 DIAGNOSIS — E785 Hyperlipidemia, unspecified: Secondary | ICD-10-CM | POA: Insufficient documentation

## 2019-04-19 DIAGNOSIS — Z882 Allergy status to sulfonamides status: Secondary | ICD-10-CM | POA: Diagnosis not present

## 2019-04-19 DIAGNOSIS — Z79899 Other long term (current) drug therapy: Secondary | ICD-10-CM | POA: Insufficient documentation

## 2019-04-19 LAB — ECHOCARDIOGRAM COMPLETE
Height: 63 in
Weight: 4409.6 oz

## 2019-04-19 MED ORDER — POTASSIUM CHLORIDE ER 10 MEQ PO TBCR: EXTENDED_RELEASE_TABLET | ORAL | 2 refills | Status: DC

## 2019-04-19 NOTE — Progress Notes (Signed)
Primary Care Physician: [name], MD Referring Physician:Dr. Rayann Heman   Tracy Miles is a 85 y.o. female with a h/o paroxysmal afib, on dofetilide, that  Is her after successful cardioversion. Unfortunately only lasted 4 days. She has RVR today in the 120's. Dofetilide had worked very well for pt for years. She had another cardioversion the first of this year.   F/u in afib clinic, 7/13. She is back for EKG with 25 mg of Toprol added. She remains with RVR and soft BP so no room to increase BB. Discussed with pt washing out Tikosyn and staring amiodarone. She wishes to go ahead with this plan as she is symptomatic in afib, has RVR and cannot do her usual activities.  She was on amiodarone in 2015 and  eye doctor found some mild corneal scarring and she wanted to come off  as it to prevent potential side effects. CXR showed possible interstitial lung disease vrs edema in 2016. Per d/c instructions form Tikosyn admit in 2016, Dr. Rayann Heman suggested outpatient amiodarone if she did not continue SR on tikosyn. We discussed going back on amiodarone but I wanted pulmonary consult as pt showed the interstial changes.  Pt is now back in afib clinic, 8/19, f/u pulmonology consult by Rexene Edison, NP to see possibility of amiodarone use. Pt did not stop Tikosyn. She has afib with RVR at 130 bpm.  She  did not advise use of amiodarone 2/2 to subpleural fibrosis . Pt is extremely fatigued in afib and is very fatigued, feels her QOL is suffering. She  has RVR despite recent  increase in BB and swells with BP drop with increase of CCB.   Today, she denies symptoms of palpitations, chest pain, shortness of breath, orthopnea, PND, lower extremity edema, dizziness, presyncope, syncope, or neurologic sequela.+ fatigue/lightheadedness. The patient is tolerating medications without difficulties and is otherwise without complaint today.   Past Medical History:  Diagnosis Date  . Anemia   . Aortic stenosis    Very mild  . Benign paroxysmal positional vertigo 12/17/2013  . Chicken pox as a child  . Colon polyp   . Coronary artery disease   . DDD (degenerative disc disease) 11/16/2013  . Hyperlipidemia   . Hypertension   . Iron deficiency anemia 11/13/2013  . Mumps child and teenager  . OA (osteoarthritis) 11/19/2013   S/p L TKR  . Obesity   . OSA on CPAP   . Pancreatitis    post hysterectomy  . Persistent atrial fibrillation   . Personal history of colonic polyps 02/25/2014  . Personal history of DVT (deep vein thrombosis) X 2   "left"  . Sleep apnea   . Spinal stenosis    Past Surgical History:  Procedure Laterality Date  . APPENDECTOMY    . CARDIOVERSION N/A 08/09/2015   Procedure: CARDIOVERSION;  Surgeon: Jerline Pain, MD;  Location: [place];  Service: Cardiovascular;  Laterality: N/A;  . CARDIOVERSION N/A 09/30/2018   Procedure: CARDIOVERSION;  Surgeon: [name], MD;  Location: [facility] ENDOSCOPY;  Service: Cardiovascular;  Laterality: N/A;  . CARDIOVERSION N/A 02/27/2019   Procedure: CARDIOVERSION;  Surgeon: Buford Dresser, MD;  Location: [facility] ENDOSCOPY;  Service: Cardiovascular;  Laterality: N/A;  . CATARACT EXTRACTION W/ INTRAOCULAR LENS  IMPLANT, BILATERAL Bilateral 2016  . CYST REMOVAL HAND Bilateral 1990's   "played to much golf"  . DILATION AND CURETTAGE OF UTERUS    . LUMBAR LAMINECTOMY  40 yrs ago   "L3-4"  . MENISCUS  REPAIR Bilateral 2000 and 2010  . REPLACEMENT TOTAL KNEE Left 2013  . TONSILECTOMY, ADENOIDECTOMY, BILATERAL MYRINGOTOMY AND TUBES  child  . TOTAL ABDOMINAL HYSTERECTOMY  1995   had 2 tumors- benign  . TUBAL LIGATION  85 years old    Current Outpatient Medications  Medication Sig Dispense Refill  . acetaminophen (TYLENOL) 325 MG tablet Take 325 mg by mouth daily.     Marland Kitchen apixaban (ELIQUIS) 5 MG TABS tablet TAKE ONE (1) TABLET BY MOUTH TWO (2) TIMES DAILY 180 tablet 1  . atorvastatin (LIPITOR) 20 MG tablet TAKE 1 TABLET BY MOUTH  DAILY AT 6  PM. 90 tablet 2  . bumetanide (BUMEX) 1 MG tablet TAKE ONE (1) TABLET BY MOUTH EVERY DAY (Patient taking differently: Take 1 mg by mouth daily. TAKE ONE (1) TABLET BY MOUTH EVERY DAY) 90 tablet 1  . calcium carbonate (OS-CAL) 600 MG TABS tablet Take 600 mg by mouth 2 (two) times daily with a meal.    . diltiazem (CARDIZEM CD) 120 MG 24 hr capsule Take 1 capsule (120 mg total) by mouth daily. 60 capsule 6  . dofetilide (TIKOSYN) 250 MCG capsule Take 250 mcg by mouth 2 (two) times daily.    Marland Kitchen lactobacillus acidophilus (BACID) TABS tablet Take 1 tablet by mouth daily.     Marland Kitchen lisinopril (ZESTRIL) 10 MG tablet Take 1 tablet (10 mg total) by mouth daily. 90 tablet 1  . loratadine (CLARITIN) 10 MG tablet Take 10 mg by mouth daily.     . metoprolol succinate (TOPROL XL) 25 MG 24 hr tablet Take 1 tablet (25 mg total) by mouth 3 (three) times daily. 60 tablet 3  . potassium chloride (K-DUR) 10 MEQ tablet TAKE 1 TABLET BY MOUTH  EVERY MORNING AND 2 TABLETS BY MOUTH IN THE EVENING 270 tablet 2  . psyllium (REGULOID) 0.52 g capsule Take 0.52 g by mouth 2 (two) times daily.      No current facility-administered medications for this encounter.     Allergies  Allergen Reactions  . Gabapentin Swelling  . Lactose Intolerance (Gi) Other (See Comments)    Bothers her stomach  . Zebeta [Bisoprolol Fumarate] Nausea Only  . Penicillins Rash      . Sulfa Antibiotics Rash    Social History   Socioeconomic History  . Marital status: Widowed    Spouse name: Not on file  . Number of children: 3  . Years of education: Not on file  . Highest education level: Not on file  Occupational History  . Occupation: retired Tour manager  . Financial resource strain: Not on file  . Food insecurity    Worry: Not on file    Inability: Not on file  . Transportation needs    Medical: Not on file    Non-medical: Not on file  Tobacco Use  . Smoking status: Never Smoker  . Smokeless tobacco: Never Used  .  Tobacco comment: never used tobacco  Substance and Sexual Activity  . Alcohol use: No  . Drug use: No  . Sexual activity: Never    Comment: [place], low sodium diet  Lifestyle  . Physical activity    Days per week: Not on file    Minutes per session: Not on file  . Stress: Not on file  Relationships  . Social Herbalist on phone: Not on file    Gets together: Not on file    Attends religious service:  Not on file    Active member of club or organization: Not on file    Attends meetings of clubs or organizations: Not on file    Relationship status: Not on file  . Intimate partner violence    Fear of current or ex partner: Not on file    Emotionally abused: Not on file    Physically abused: Not on file    Forced sexual activity: Not on file  Other Topics Concern  . Not on file  Social History Narrative  . Not on file    Family History  Problem Relation Age of Onset  . Heart attack Mother 58  . Hyperlipidemia Mother        ?  Marland Kitchen Dementia Mother   . Pernicious anemia Mother   . COPD Father        smoker  . Cancer Father 21       prostate  . Heart disease Brother        quadruple bipass surgery  . Hyperlipidemia Brother   . Hypertension Brother   . Diabetes Maternal Grandmother        type 2  . Pernicious anemia Maternal Grandmother   . Gout Son   . Atrial fibrillation Son   . Hyperlipidemia Son   . Cancer Maternal Grandfather        liver  . Cancer Paternal Grandmother        lung- doesn't think she smokes?  . Stroke Paternal Grandfather   . Cancer Son 50       non hodgin's lymphoma  . Gout Son   . Hyperlipidemia Son   . Sleep apnea Son   . Colon cancer Neg Hx   . Pancreatic cancer Neg Hx   . Stomach cancer Neg Hx   . Throat cancer Neg Hx   . Liver disease Neg Hx     ROS- All systems are reviewed and negative except as per the HPI above  Physical Exam: Vitals:   04/19/19 1440  BP: 140/82  Pulse: (!) 131  Weight: 125 kg   Height: 5\' 3"  (1.6 m)   Wt Readings from Last 3 Encounters:  04/19/19 125 kg  04/14/19 123.8 kg  04/11/19 124 kg    Labs: Lab Results  Component Value Date   NA 137 02/20/2019   K 4.1 02/20/2019   CL 101 02/20/2019   CO2 26 02/20/2019   GLUCOSE 122 (H) 02/20/2019   BUN 16 02/20/2019   CREATININE 1.12 (H) 02/20/2019   CALCIUM 9.3 02/20/2019   PHOS 3.7 08/23/2014   MG 2.1 02/20/2019   Lab Results  Component Value Date   INR 1.55 (H) 08/06/2015   Lab Results  Component Value Date   CHOL 165 10/20/2018   HDL 45 (L) 10/20/2018   LDLCALC 95 10/20/2018   TRIG 158 (H) 10/20/2018     GEN- The patient is well appearing, alert and oriented x 3 today.   Head- normocephalic, atraumatic Eyes-  Sclera clear, conjunctiva pink Ears- hearing intact Oropharynx- clear Neck- supple, no JVP Lymph- no cervical lymphadenopathy Lungs- Clear to ausculation bilaterally, normal work of breathing Heart- Rapid irregular rate and rhythm, no murmurs, rubs or gallops, PMI not laterally displaced GI- soft, NT, ND, + BS Extremities- no clubbing, cyanosis, or edema MS- no significant deformity or atrophy Skin- no rash or lesion Psych- euthymic mood, full affect Neuro- strength and sensation are intact  EKG-atrial fib at 131  bpm, qrs int 66 bpm, qtc  434 ms Epic records reviewed    Assessment and Plan: 1.  Atrial fib/flutter  with RVR S/p successful cardioversion in January and again end of June  but ERAF Had failure of tikosyn. Has used amio in the past,  She states that she was willing to retry However, Pulmonary does not advise it as she has subpleural fibrosis She is very hard to rate control and is very symptomatic with afib with poor QOL Continue cardizem  120 mg qd  Continue BB at current dose Continue eliquis 5 mg bid  Will update echo and get referral to Dr. Rayann Heman for consideration for  ablation( although her age may be an issue) vrs PPM with AV nodal ablation For now stay on  tikosyn as pt did not stop it,  until plan is known  2. HTN Stable    Tracy Miles C. Eran Mistry, [facility] [address] [place], [place] [address] [phone]

## 2019-04-20 ENCOUNTER — Ambulatory Visit: Payer: Medicare Other | Admitting: Family Medicine

## 2019-04-20 ENCOUNTER — Other Ambulatory Visit (HOSPITAL_COMMUNITY): Payer: Self-pay | Admitting: *Deleted

## 2019-04-20 ENCOUNTER — Other Ambulatory Visit: Payer: Self-pay | Admitting: Family Medicine

## 2019-04-20 DIAGNOSIS — I4819 Other persistent atrial fibrillation: Secondary | ICD-10-CM

## 2019-04-20 DIAGNOSIS — J849 Interstitial pulmonary disease, unspecified: Secondary | ICD-10-CM | POA: Diagnosis not present

## 2019-04-21 ENCOUNTER — Telehealth: Payer: Self-pay | Admitting: *Deleted

## 2019-04-21 ENCOUNTER — Other Ambulatory Visit (HOSPITAL_COMMUNITY)
Admission: RE | Admit: 2019-04-21 | Discharge: 2019-04-21 | Disposition: A | Discharge status: Home / Self Care | Payer: Medicare Other | Source: Ambulatory Visit | Attending: Internal Medicine | Admitting: Internal Medicine

## 2019-04-21 ENCOUNTER — Ambulatory Visit (HOSPITAL_COMMUNITY)
Admission: RE | Admit: 2019-04-21 | Discharge: 2019-04-21 | Disposition: A | Discharge status: Home / Self Care | Payer: Medicare Other | Source: Ambulatory Visit | Attending: Nurse Practitioner | Admitting: Nurse Practitioner

## 2019-04-21 ENCOUNTER — Other Ambulatory Visit (HOSPITAL_COMMUNITY): Payer: Self-pay | Admitting: *Deleted

## 2019-04-21 ENCOUNTER — Encounter (HOSPITAL_COMMUNITY): Payer: Self-pay | Admitting: Internal Medicine

## 2019-04-21 ENCOUNTER — Encounter (HOSPITAL_COMMUNITY): Payer: Self-pay

## 2019-04-21 ENCOUNTER — Telehealth (HOSPITAL_COMMUNITY): Payer: Self-pay | Admitting: *Deleted

## 2019-04-21 ENCOUNTER — Other Ambulatory Visit: Payer: Self-pay

## 2019-04-21 ENCOUNTER — Ambulatory Visit: Payer: Medicare Other | Admitting: Pulmonary Disease

## 2019-04-21 DIAGNOSIS — G4733 Obstructive sleep apnea (adult) (pediatric): Secondary | ICD-10-CM | POA: Diagnosis not present

## 2019-04-21 DIAGNOSIS — E6609 Other obesity due to excess calories: Secondary | ICD-10-CM | POA: Diagnosis not present

## 2019-04-21 DIAGNOSIS — I4819 Other persistent atrial fibrillation: Secondary | ICD-10-CM

## 2019-04-21 DIAGNOSIS — Z01812 Encounter for preprocedural laboratory examination: Secondary | ICD-10-CM | POA: Diagnosis not present

## 2019-04-21 DIAGNOSIS — Z20828 Contact with and (suspected) exposure to other viral communicable diseases: Secondary | ICD-10-CM | POA: Insufficient documentation

## 2019-04-21 LAB — SARS CORONAVIRUS 2 (TAT 6-24 HRS): SARS Coronavirus 2: NEGATIVE

## 2019-04-21 NOTE — Telephone Encounter (Signed)
Copied from [place] [id number]. Topic: General - Inquiry >> Apr 21, 2019  3:21 PM Scherrie Gerlach wrote: Reason for CRM:  pt is having a procedure Monday.  Dr had ordered labs, but pt went to [facility].they need results for  CBC CMP  stacey  [phone]

## 2019-04-21 NOTE — Progress Notes (Signed)
PT states they will remain quarantined until time of procedure. Denies fever and cough. Pt made aware of visitor policy.   

## 2019-04-21 NOTE — Telephone Encounter (Signed)
Called patient and let her know that she will need to get in touch with [facility] to send the results to [place]. She stated she will call them. She gave me the address of where she went so I could see if I can get in touch with someone

## 2019-04-21 NOTE — Telephone Encounter (Addendum)
Patient had labs ordered by Dr. Charlett Blake including CBC/CMET drawn at [facility] on 8/20. Call has been placed to Dr. Charlett Blake office to expedite lab results be faxed to DR. Allred office for upcoming TEE & Ablation on 8/24. Fax # [phone].

## 2019-04-21 NOTE — Telephone Encounter (Signed)
Patient will need to call [facility]. I do not know which location she went to.

## 2019-04-22 NOTE — Progress Notes (Signed)
Reviewed and agree with assessment/plan.   Joenathan Sakuma, MD [facility] 08/26/2016, 12:24 PM Pager:  [phone]  

## 2019-04-23 NOTE — Progress Notes (Signed)
Reviewed and agree with assessment/plan.   Tamiki Kuba, MD [place] [facility] 08/26/2016, 12:24 PM Pager:  [phone]  

## 2019-04-23 NOTE — H&P (View-Only) (Signed)
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  She has developed medicine refractory atrial fibrillation.  I saw her several years ago and we placed her on tikosyn.  She did well for quite some time.  Unfortunately, she has developed symptomatic and persistent afib.  She had very fast ventricular rates with symptoms of fatigue and decreased exercise tolerance.  + SOB.  Today, she denies symptoms of palpitations, chest pain,   lower extremity edema, dizziness, presyncope, or syncope.  The patient is otherwise without complaint today.   Past Medical History:  Diagnosis Date  . Anemia   . Aortic stenosis    Very mild  . Benign paroxysmal positional vertigo 12/17/2013  . Chicken pox as a child  . Colon polyp   . Coronary artery disease   . DDD (degenerative disc disease) 11/16/2013  . Hyperlipidemia   . Hypertension   . Iron deficiency anemia 11/13/2013  . Mumps child and teenager  . OA (osteoarthritis) 11/19/2013   S/p L TKR  . Obesity   . OSA on CPAP   . Pancreatitis    post hysterectomy  . Persistent atrial fibrillation   . Personal history of colonic polyps 02/25/2014  . Personal history of DVT (deep vein thrombosis) X 2   "left"  . Sleep apnea   . Spinal stenosis    Past Surgical History:  Procedure Laterality Date  . APPENDECTOMY    . CARDIOVERSION N/A 08/09/2015   Procedure: CARDIOVERSION;  Surgeon: Jerline Pain, MD;  Location: [place];  Service: Cardiovascular;  Laterality: N/A;  . CARDIOVERSION N/A 09/30/2018   Procedure: CARDIOVERSION;  Surgeon: [name], MD;  Location: [facility] ENDOSCOPY;  Service: Cardiovascular;  Laterality: N/A;  . CARDIOVERSION N/A 02/27/2019   Procedure: CARDIOVERSION;  Surgeon: Buford Dresser, MD;  Location: [facility] ENDOSCOPY;  Service: Cardiovascular;  Laterality: N/A;  . CATARACT EXTRACTION W/ INTRAOCULAR LENS  IMPLANT, BILATERAL  Bilateral 2016  . CYST REMOVAL HAND Bilateral 1990's   "played to much golf"  . DILATION AND CURETTAGE OF UTERUS    . LUMBAR LAMINECTOMY  40 yrs ago   "L3-4"  . MENISCUS REPAIR Bilateral 2000 and 2010  . REPLACEMENT TOTAL KNEE Left 2013  . TONSILECTOMY, ADENOIDECTOMY, BILATERAL MYRINGOTOMY AND TUBES  child  . TOTAL ABDOMINAL HYSTERECTOMY  1995   had 2 tumors- benign  . TUBAL LIGATION  85 years old    ROS- all systems are reviewed and negatives except as per HPI above  Current Outpatient Medications  Medication Sig Dispense Refill  . acetaminophen (TYLENOL) 325 MG tablet Take 325 mg by mouth daily.     Marland Kitchen apixaban (ELIQUIS) 5 MG TABS tablet TAKE ONE (1) TABLET BY MOUTH TWO (2) TIMES DAILY (Patient taking differently: Take 5 mg by mouth 2 (two) times daily. ) 180 tablet 1  . atorvastatin (LIPITOR) 20 MG tablet TAKE 1 TABLET BY MOUTH  DAILY AT 6 PM. (Patient taking differently: Take 20 mg by mouth daily at 6 PM. ) 90 tablet 2  . bumetanide (BUMEX) 1 MG tablet TAKE ONE (1) TABLET BY MOUTH EVERY DAY (Patient taking differently: Take 1 mg by mouth daily. TAKE ONE (1) TABLET BY MOUTH EVERY DAY) 90 tablet 1  . calcium carbonate (OS-CAL) 600 MG TABS tablet Take 600 mg by mouth 2 (two) times daily with a meal.    . diltiazem (CARDIZEM CD) 120 MG  24 hr capsule Take 1 capsule (120 mg total) by mouth daily. 60 capsule 6  . dofetilide (TIKOSYN) 250 MCG capsule Take 250 mcg by mouth 2 (two) times daily.    Marland Kitchen lactobacillus acidophilus (BACID) TABS tablet Take 1 tablet by mouth daily.     Marland Kitchen lisinopril (ZESTRIL) 10 MG tablet Take 1 tablet (10 mg total) by mouth daily. 90 tablet 1  . loratadine (CLARITIN) 10 MG tablet Take 10 mg by mouth daily.     . metoprolol succinate (TOPROL XL) 25 MG 24 hr tablet Take 1 tablet (25 mg total) by mouth 3 (three) times daily. 60 tablet 3  . potassium chloride (K-DUR) 10 MEQ tablet TAKE 1 TABLET BY MOUTH  EVERY MORNING AND 2 TABLETS BY MOUTH IN THE EVENING (Patient taking  differently: Take 10 mEq by mouth 2 (two) times daily. ) 270 tablet 2  . psyllium (REGULOID) 0.52 g capsule Take 0.52 g by mouth 2 (two) times daily.     . hydroxypropyl methylcellulose / hypromellose (ISOPTO TEARS / GONIOVISC) 2.5 % ophthalmic solution Place 1 drop into both eyes 3 (three) times daily as needed for dry eyes.     No current facility-administered medications for this encounter.     Physical Exam: Vitals:   04/21/19 0948  BP: 128/70  Pulse: (!) 113  SpO2: 95%  Weight: 125.1 kg  Height: 5\' 3"  (1.6 m)    GEN- The patient is well appearing, alert and oriented x 3 today.   Head- normocephalic, atraumatic Eyes-  Sclera clear, conjunctiva pink Ears- hearing intact Oropharynx- clear Lungs- Clear to ausculation bilaterally, normal work of breathing Heart- tachycardic irregular rhythm GI- soft, NT, ND, + BS Extremities- no clubbing, cyanosis, or edema  Wt Readings from Last 3 Encounters:  04/21/19 125.1 kg  04/19/19 125 kg  04/14/19 123.8 kg    EKG tracing ordered today is personally reviewed and shows afib with RVR (V rates 130s)  Recent echo is also reviewed  Assessment and Plan:  1. Persistent atrial fibrillation The patient has symptomatic, recurrent persistent atrial fibrillation. she has failed medical therapy with tikosyn.  Given advanced lung disease, she is felt to not be a candidate for amiodarone.  She is very symptomatic with afib.  V rates are also quite elevated. Chads2vasc score is at least 5.  She also had had recurrent L leg DVTs.  she is anticoagulated with eliquis . Therapeutic strategies for afib including medicine and ablation were discussed in detail with the patient today. Risk, benefits, and alternatives to EP study and radiofrequency ablation for afib were also discussed in detail today. These risks include but are not limited to stroke, bleeding, vascular damage, tamponade, perforation, damage to the esophagus, lungs, and other structures,  pulmonary vein stenosis, worsening renal function, and death. The patient understands these risk and wishes to proceed.  We will therefore proceed with catheter ablation at the next available time.  Carto, ICE, anesthesia are requested for the procedure.  Will also obtain TEE prior to the procedure to exclude LAA thrombus and further evaluate atrial anatomy.  2. Obesity Lifestyle modification is encouraged I have reviewed the patients BMI and decreased success rates with ablation at length today.  Weight loss is strongly advised.  Per Guijian et al (PACE 2013; [id number]), patients with BMI 25-29.9 (obese) have a 27% increase in AF recurrence post ablation.  Patients with BMI >30 have a 31% increase in AF recurrence post ablation when compared to those with BMI <  25.  3. OSA She reports compliance with CPAP  4. HTN Stable No change required today   [name] MD, [facility]

## 2019-04-23 NOTE — Telephone Encounter (Signed)
Please forward labs if need be, check with Dr Jackalyn Lombard office. The labs are in [facility]

## 2019-04-23 NOTE — H&P (View-Only) (Signed)
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  She has developed medicine refractory atrial fibrillation.  I saw her several years ago and we placed her on tikosyn.  She did well for quite some time.  Unfortunately, she has developed symptomatic and persistent afib.  She had very fast ventricular rates with symptoms of fatigue and decreased exercise tolerance.  + SOB.  Today, she denies symptoms of palpitations, chest pain,   lower extremity edema, dizziness, presyncope, or syncope.  The patient is otherwise without complaint today.   Past Medical History:  Diagnosis Date  . Anemia   . Aortic stenosis    Very mild  . Benign paroxysmal positional vertigo 12/17/2013  . Chicken pox as a child  . Colon polyp   . Coronary artery disease   . DDD (degenerative disc disease) 11/16/2013  . Hyperlipidemia   . Hypertension   . Iron deficiency anemia 11/13/2013  . Mumps child and teenager  . OA (osteoarthritis) 11/19/2013   S/p L TKR  . Obesity   . OSA on CPAP   . Pancreatitis    post hysterectomy  . Persistent atrial fibrillation   . Personal history of colonic polyps 02/25/2014  . Personal history of DVT (deep vein thrombosis) X 2   "left"  . Sleep apnea   . Spinal stenosis    Past Surgical History:  Procedure Laterality Date  . APPENDECTOMY    . CARDIOVERSION N/A 08/09/2015   Procedure: CARDIOVERSION;  Surgeon: Jerline Pain, MD;  Location: [place];  Service: Cardiovascular;  Laterality: N/A;  . CARDIOVERSION N/A 09/30/2018   Procedure: CARDIOVERSION;  Surgeon: [name], MD;  Location: [facility] ENDOSCOPY;  Service: Cardiovascular;  Laterality: N/A;  . CARDIOVERSION N/A 02/27/2019   Procedure: CARDIOVERSION;  Surgeon: Buford Dresser, MD;  Location: [facility] ENDOSCOPY;  Service: Cardiovascular;  Laterality: N/A;  . CATARACT EXTRACTION W/ INTRAOCULAR LENS  IMPLANT, BILATERAL  Bilateral 2016  . CYST REMOVAL HAND Bilateral 1990's   "played to much golf"  . DILATION AND CURETTAGE OF UTERUS    . LUMBAR LAMINECTOMY  40 yrs ago   "L3-4"  . MENISCUS REPAIR Bilateral 2000 and 2010  . REPLACEMENT TOTAL KNEE Left 2013  . TONSILECTOMY, ADENOIDECTOMY, BILATERAL MYRINGOTOMY AND TUBES  child  . TOTAL ABDOMINAL HYSTERECTOMY  1995   had 2 tumors- benign  . TUBAL LIGATION  85 years old    ROS- all systems are reviewed and negatives except as per HPI above  Current Outpatient Medications  Medication Sig Dispense Refill  . acetaminophen (TYLENOL) 325 MG tablet Take 325 mg by mouth daily.     Marland Kitchen apixaban (ELIQUIS) 5 MG TABS tablet TAKE ONE (1) TABLET BY MOUTH TWO (2) TIMES DAILY (Patient taking differently: Take 5 mg by mouth 2 (two) times daily. ) 180 tablet 1  . atorvastatin (LIPITOR) 20 MG tablet TAKE 1 TABLET BY MOUTH  DAILY AT 6 PM. (Patient taking differently: Take 20 mg by mouth daily at 6 PM. ) 90 tablet 2  . bumetanide (BUMEX) 1 MG tablet TAKE ONE (1) TABLET BY MOUTH EVERY DAY (Patient taking differently: Take 1 mg by mouth daily. TAKE ONE (1) TABLET BY MOUTH EVERY DAY) 90 tablet 1  . calcium carbonate (OS-CAL) 600 MG TABS tablet Take 600 mg by mouth 2 (two) times daily with a meal.    . diltiazem (CARDIZEM CD) 120 MG  24 hr capsule Take 1 capsule (120 mg total) by mouth daily. 60 capsule 6  . dofetilide (TIKOSYN) 250 MCG capsule Take 250 mcg by mouth 2 (two) times daily.    Marland Kitchen lactobacillus acidophilus (BACID) TABS tablet Take 1 tablet by mouth daily.     Marland Kitchen lisinopril (ZESTRIL) 10 MG tablet Take 1 tablet (10 mg total) by mouth daily. 90 tablet 1  . loratadine (CLARITIN) 10 MG tablet Take 10 mg by mouth daily.     . metoprolol succinate (TOPROL XL) 25 MG 24 hr tablet Take 1 tablet (25 mg total) by mouth 3 (three) times daily. 60 tablet 3  . potassium chloride (K-DUR) 10 MEQ tablet TAKE 1 TABLET BY MOUTH  EVERY MORNING AND 2 TABLETS BY MOUTH IN THE EVENING (Patient taking  differently: Take 10 mEq by mouth 2 (two) times daily. ) 270 tablet 2  . psyllium (REGULOID) 0.52 g capsule Take 0.52 g by mouth 2 (two) times daily.     . hydroxypropyl methylcellulose / hypromellose (ISOPTO TEARS / GONIOVISC) 2.5 % ophthalmic solution Place 1 drop into both eyes 3 (three) times daily as needed for dry eyes.     No current facility-administered medications for this encounter.     Physical Exam: Vitals:   04/21/19 0948  BP: 128/70  Pulse: (!) 113  SpO2: 95%  Weight: 125.1 kg  Height: 5\' 3"  (1.6 m)    GEN- The patient is well appearing, alert and oriented x 3 today.   Head- normocephalic, atraumatic Eyes-  Sclera clear, conjunctiva pink Ears- hearing intact Oropharynx- clear Lungs- Clear to ausculation bilaterally, normal work of breathing Heart- tachycardic irregular rhythm GI- soft, NT, ND, + BS Extremities- no clubbing, cyanosis, or edema  Wt Readings from Last 3 Encounters:  04/21/19 125.1 kg  04/19/19 125 kg  04/14/19 123.8 kg    EKG tracing ordered today is personally reviewed and shows afib with RVR (V rates 130s)  Recent echo is also reviewed  Assessment and Plan:  1. Persistent atrial fibrillation The patient has symptomatic, recurrent persistent atrial fibrillation. she has failed medical therapy with tikosyn.  Given advanced lung disease, she is felt to not be a candidate for amiodarone.  She is very symptomatic with afib.  V rates are also quite elevated. Chads2vasc score is at least 5.  She also had had recurrent L leg DVTs.  she is anticoagulated with eliquis . Therapeutic strategies for afib including medicine and ablation were discussed in detail with the patient today. Risk, benefits, and alternatives to EP study and radiofrequency ablation for afib were also discussed in detail today. These risks include but are not limited to stroke, bleeding, vascular damage, tamponade, perforation, damage to the esophagus, lungs, and other structures,  pulmonary vein stenosis, worsening renal function, and death. The patient understands these risk and wishes to proceed.  We will therefore proceed with catheter ablation at the next available time.  Carto, ICE, anesthesia are requested for the procedure.  Will also obtain TEE prior to the procedure to exclude LAA thrombus and further evaluate atrial anatomy.  2. Obesity Lifestyle modification is encouraged I have reviewed the patients BMI and decreased success rates with ablation at length today.  Weight loss is strongly advised.  Per Guijian et al (PACE 2013; [id number]), patients with BMI 25-29.9 (obese) have a 27% increase in AF recurrence post ablation.  Patients with BMI >30 have a 31% increase in AF recurrence post ablation when compared to those with BMI <  25.  3. OSA She reports compliance with CPAP  4. HTN Stable No change required today   [name] MD, [facility]

## 2019-04-23 NOTE — Progress Notes (Signed)
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  She has developed medicine refractory atrial fibrillation.  I saw her several years ago and we placed her on tikosyn.  She did well for quite some time.  Unfortunately, she has developed symptomatic and persistent afib.  She had very fast ventricular rates with symptoms of fatigue and decreased exercise tolerance.  + SOB.  Today, she denies symptoms of palpitations, chest pain,   lower extremity edema, dizziness, presyncope, or syncope.  The patient is otherwise without complaint today.   Past Medical History:  Diagnosis Date  . Anemia   . Aortic stenosis    Very mild  . Benign paroxysmal positional vertigo 12/17/2013  . Chicken pox as a child  . Colon polyp   . Coronary artery disease   . DDD (degenerative disc disease) 11/16/2013  . Hyperlipidemia   . Hypertension   . Iron deficiency anemia 11/13/2013  . Mumps child and teenager  . OA (osteoarthritis) 11/19/2013   S/p L TKR  . Obesity   . OSA on CPAP   . Pancreatitis    post hysterectomy  . Persistent atrial fibrillation   . Personal history of colonic polyps 02/25/2014  . Personal history of DVT (deep vein thrombosis) X 2   "left"  . Sleep apnea   . Spinal stenosis    Past Surgical History:  Procedure Laterality Date  . APPENDECTOMY    . CARDIOVERSION N/A 08/09/2015   Procedure: CARDIOVERSION;  Surgeon: Jerline Pain, MD;  Location: [place];  Service: Cardiovascular;  Laterality: N/A;  . CARDIOVERSION N/A 09/30/2018   Procedure: CARDIOVERSION;  Surgeon: [name], MD;  Location: [facility] ENDOSCOPY;  Service: Cardiovascular;  Laterality: N/A;  . CARDIOVERSION N/A 02/27/2019   Procedure: CARDIOVERSION;  Surgeon: Buford Dresser, MD;  Location: [facility] ENDOSCOPY;  Service: Cardiovascular;  Laterality: N/A;  . CATARACT EXTRACTION W/ INTRAOCULAR LENS  IMPLANT, BILATERAL  Bilateral 2016  . CYST REMOVAL HAND Bilateral 1990's   "played to much golf"  . DILATION AND CURETTAGE OF UTERUS    . LUMBAR LAMINECTOMY  40 yrs ago   "L3-4"  . MENISCUS REPAIR Bilateral 2000 and 2010  . REPLACEMENT TOTAL KNEE Left 2013  . TONSILECTOMY, ADENOIDECTOMY, BILATERAL MYRINGOTOMY AND TUBES  child  . TOTAL ABDOMINAL HYSTERECTOMY  1995   had 2 tumors- benign  . TUBAL LIGATION  85 years old    ROS- all systems are reviewed and negatives except as per HPI above  Current Outpatient Medications  Medication Sig Dispense Refill  . acetaminophen (TYLENOL) 325 MG tablet Take 325 mg by mouth daily.     Marland Kitchen apixaban (ELIQUIS) 5 MG TABS tablet TAKE ONE (1) TABLET BY MOUTH TWO (2) TIMES DAILY (Patient taking differently: Take 5 mg by mouth 2 (two) times daily. ) 180 tablet 1  . atorvastatin (LIPITOR) 20 MG tablet TAKE 1 TABLET BY MOUTH  DAILY AT 6 PM. (Patient taking differently: Take 20 mg by mouth daily at 6 PM. ) 90 tablet 2  . bumetanide (BUMEX) 1 MG tablet TAKE ONE (1) TABLET BY MOUTH EVERY DAY (Patient taking differently: Take 1 mg by mouth daily. TAKE ONE (1) TABLET BY MOUTH EVERY DAY) 90 tablet 1  . calcium carbonate (OS-CAL) 600 MG TABS tablet Take 600 mg by mouth 2 (two) times daily with a meal.    . diltiazem (CARDIZEM CD) 120 MG  24 hr capsule Take 1 capsule (120 mg total) by mouth daily. 60 capsule 6  . dofetilide (TIKOSYN) 250 MCG capsule Take 250 mcg by mouth 2 (two) times daily.    Marland Kitchen lactobacillus acidophilus (BACID) TABS tablet Take 1 tablet by mouth daily.     Marland Kitchen lisinopril (ZESTRIL) 10 MG tablet Take 1 tablet (10 mg total) by mouth daily. 90 tablet 1  . loratadine (CLARITIN) 10 MG tablet Take 10 mg by mouth daily.     . metoprolol succinate (TOPROL XL) 25 MG 24 hr tablet Take 1 tablet (25 mg total) by mouth 3 (three) times daily. 60 tablet 3  . potassium chloride (K-DUR) 10 MEQ tablet TAKE 1 TABLET BY MOUTH  EVERY MORNING AND 2 TABLETS BY MOUTH IN THE EVENING (Patient taking  differently: Take 10 mEq by mouth 2 (two) times daily. ) 270 tablet 2  . psyllium (REGULOID) 0.52 g capsule Take 0.52 g by mouth 2 (two) times daily.     . hydroxypropyl methylcellulose / hypromellose (ISOPTO TEARS / GONIOVISC) 2.5 % ophthalmic solution Place 1 drop into both eyes 3 (three) times daily as needed for dry eyes.     No current facility-administered medications for this encounter.     Physical Exam: Vitals:   04/21/19 0948  BP: 128/70  Pulse: (!) 113  SpO2: 95%  Weight: 125.1 kg  Height: 5\' 3"  (1.6 m)    GEN- The patient is well appearing, alert and oriented x 3 today.   Head- normocephalic, atraumatic Eyes-  Sclera clear, conjunctiva pink Ears- hearing intact Oropharynx- clear Lungs- Clear to ausculation bilaterally, normal work of breathing Heart- tachycardic irregular rhythm GI- soft, NT, ND, + BS Extremities- no clubbing, cyanosis, or edema  Wt Readings from Last 3 Encounters:  04/21/19 125.1 kg  04/19/19 125 kg  04/14/19 123.8 kg    EKG tracing ordered today is personally reviewed and shows afib with RVR (V rates 130s)  Recent echo is also reviewed  Assessment and Plan:  1. Persistent atrial fibrillation The patient has symptomatic, recurrent persistent atrial fibrillation. she has failed medical therapy with tikosyn.  Given advanced lung disease, she is felt to not be a candidate for amiodarone.  She is very symptomatic with afib.  V rates are also quite elevated. Chads2vasc score is at least 5.  She also had had recurrent L leg DVTs.  she is anticoagulated with eliquis . Therapeutic strategies for afib including medicine and ablation were discussed in detail with the patient today. Risk, benefits, and alternatives to EP study and radiofrequency ablation for afib were also discussed in detail today. These risks include but are not limited to stroke, bleeding, vascular damage, tamponade, perforation, damage to the esophagus, lungs, and other structures,  pulmonary vein stenosis, worsening renal function, and death. The patient understands these risk and wishes to proceed.  We will therefore proceed with catheter ablation at the next available time.  Carto, ICE, anesthesia are requested for the procedure.  Will also obtain TEE prior to the procedure to exclude LAA thrombus and further evaluate atrial anatomy.  2. Obesity Lifestyle modification is encouraged I have reviewed the patients BMI and decreased success rates with ablation at length today.  Weight loss is strongly advised.  Per Guijian et al (PACE 2013; [id number]), patients with BMI 25-29.9 (obese) have a 27% increase in AF recurrence post ablation.  Patients with BMI >30 have a 31% increase in AF recurrence post ablation when compared to those with BMI <  25.  3. OSA She reports compliance with CPAP  4. HTN Stable No change required today   [name] MD, [facility]

## 2019-04-24 ENCOUNTER — Ambulatory Visit (HOSPITAL_COMMUNITY): Payer: Medicare Other | Admitting: Certified Registered"

## 2019-04-24 ENCOUNTER — Ambulatory Visit (HOSPITAL_COMMUNITY)
Admission: RE | Admit: 2019-04-24 | Discharge: 2019-04-24 | Disposition: A | Discharge status: Home / Self Care | Payer: Medicare Other | Attending: Cardiovascular Disease | Admitting: Cardiovascular Disease

## 2019-04-24 ENCOUNTER — Ambulatory Visit (HOSPITAL_BASED_OUTPATIENT_CLINIC_OR_DEPARTMENT_OTHER): Payer: Medicare Other

## 2019-04-24 ENCOUNTER — Encounter (HOSPITAL_COMMUNITY)
Admission: RE | Disposition: A | Discharge status: Home / Self Care | Payer: Self-pay | Source: Home / Self Care | Attending: Cardiovascular Disease

## 2019-04-24 ENCOUNTER — Other Ambulatory Visit: Payer: Self-pay

## 2019-04-24 ENCOUNTER — Encounter (HOSPITAL_COMMUNITY): Payer: Self-pay | Admitting: *Deleted

## 2019-04-24 DIAGNOSIS — Z7901 Long term (current) use of anticoagulants: Secondary | ICD-10-CM | POA: Insufficient documentation

## 2019-04-24 DIAGNOSIS — Z96652 Presence of left artificial knee joint: Secondary | ICD-10-CM | POA: Insufficient documentation

## 2019-04-24 DIAGNOSIS — E782 Mixed hyperlipidemia: Secondary | ICD-10-CM | POA: Diagnosis not present

## 2019-04-24 DIAGNOSIS — Z90710 Acquired absence of both cervix and uterus: Secondary | ICD-10-CM | POA: Diagnosis not present

## 2019-04-24 DIAGNOSIS — Z79899 Other long term (current) drug therapy: Secondary | ICD-10-CM | POA: Diagnosis not present

## 2019-04-24 DIAGNOSIS — M1990 Unspecified osteoarthritis, unspecified site: Secondary | ICD-10-CM | POA: Diagnosis not present

## 2019-04-24 DIAGNOSIS — Z86718 Personal history of other venous thrombosis and embolism: Secondary | ICD-10-CM | POA: Insufficient documentation

## 2019-04-24 DIAGNOSIS — Z6841 Body Mass Index (BMI) 40.0 and over, adult: Secondary | ICD-10-CM | POA: Insufficient documentation

## 2019-04-24 DIAGNOSIS — I2510 Atherosclerotic heart disease of native coronary artery without angina pectoris: Secondary | ICD-10-CM | POA: Diagnosis not present

## 2019-04-24 DIAGNOSIS — I4891 Unspecified atrial fibrillation: Secondary | ICD-10-CM | POA: Insufficient documentation

## 2019-04-24 DIAGNOSIS — E669 Obesity, unspecified: Secondary | ICD-10-CM | POA: Insufficient documentation

## 2019-04-24 DIAGNOSIS — I4819 Other persistent atrial fibrillation: Secondary | ICD-10-CM | POA: Diagnosis not present

## 2019-04-24 DIAGNOSIS — I081 Rheumatic disorders of both mitral and tricuspid valves: Secondary | ICD-10-CM | POA: Diagnosis not present

## 2019-04-24 DIAGNOSIS — I340 Nonrheumatic mitral (valve) insufficiency: Secondary | ICD-10-CM | POA: Diagnosis not present

## 2019-04-24 DIAGNOSIS — I10 Essential (primary) hypertension: Secondary | ICD-10-CM | POA: Diagnosis not present

## 2019-04-24 DIAGNOSIS — G4733 Obstructive sleep apnea (adult) (pediatric): Secondary | ICD-10-CM | POA: Diagnosis not present

## 2019-04-24 DIAGNOSIS — E785 Hyperlipidemia, unspecified: Secondary | ICD-10-CM | POA: Diagnosis not present

## 2019-04-24 HISTORY — PX: TEE WITHOUT CARDIOVERSION: SHX5443

## 2019-04-24 LAB — CBC/DIFF AMBIGUOUS DEFAULT
Basophils Absolute: 0 10*3/uL (ref 0.0–0.2)
Basos: 1 %
EOS (ABSOLUTE): 0.2 10*3/uL (ref 0.0–0.4)
Eos: 2 %
Hematocrit: 41.3 % (ref 34.0–46.6)
Hemoglobin: 13.7 g/dL (ref 11.1–15.9)
Immature Grans (Abs): 0 10*3/uL (ref 0.0–0.1)
Immature Granulocytes: 0 %
Lymphocytes Absolute: 1.7 10*3/uL (ref 0.7–3.1)
Lymphs: 23 %
MCH: 31 pg (ref 26.6–33.0)
MCHC: 33.2 g/dL (ref 31.5–35.7)
MCV: 93 fL (ref 79–97)
Monocytes Absolute: 0.5 10*3/uL (ref 0.1–0.9)
Monocytes: 7 %
Neutrophils Absolute: 4.7 10*3/uL (ref 1.4–7.0)
Neutrophils: 67 %
Platelets: 268 10*3/uL (ref 150–450)
RBC: 4.42 x10E6/uL (ref 3.77–5.28)
RDW: 12.4 % (ref 11.7–15.4)
WBC: 7.2 10*3/uL (ref 3.4–10.8)

## 2019-04-24 LAB — COMPREHENSIVE METABOLIC PANEL
ALT: 11 IU/L (ref 0–32)
AST: 15 IU/L (ref 0–40)
Albumin/Globulin Ratio: 2 (ref 1.2–2.2)
Albumin: 4.5 g/dL (ref 3.6–4.6)
Alkaline Phosphatase: 99 IU/L (ref 39–117)
BUN/Creatinine Ratio: 17 (ref 12–28)
BUN: 18 mg/dL (ref 8–27)
Bilirubin Total: 1 mg/dL (ref 0.0–1.2)
CO2: 25 mmol/L (ref 20–29)
Calcium: 10 mg/dL (ref 8.7–10.3)
Chloride: 101 mmol/L (ref 96–106)
Creatinine, Ser: 1.07 mg/dL — ABNORMAL HIGH (ref 0.57–1.00)
GFR calc Af Amer: 55 mL/min/{1.73_m2} — ABNORMAL LOW (ref >59)
GFR calc non Af Amer: 48 mL/min/{1.73_m2} — ABNORMAL LOW (ref >59)
Globulin, Total: 2.3 g/dL (ref 1.5–4.5)
Glucose: 105 mg/dL — ABNORMAL HIGH (ref 65–99)
Potassium: 4.5 mmol/L (ref 3.5–5.2)
Sodium: 143 mmol/L (ref 134–144)
Total Protein: 6.8 g/dL (ref 6.0–8.5)

## 2019-04-24 LAB — PAN-ANCA
ANCA Proteinase 3: <3.5 U/mL (ref 0.0–3.5)
Atypical pANCA: <1:20 {titer}
C-ANCA: <1:20 {titer}
Myeloperoxidase Ab: <9 U/mL (ref 0.0–9.0)
P-ANCA: <1:20 {titer}

## 2019-04-24 LAB — ANTINUCLEAR ANTIBODIES, IFA: ANA Titer 1: NEGATIVE

## 2019-04-24 LAB — LIPID PANEL W/O CHOL/HDL RATIO
Cholesterol, Total: 138 mg/dL (ref 100–199)
HDL: 41 mg/dL (ref >39)
LDL Calculated: 72 mg/dL (ref 0–99)
Triglycerides: 127 mg/dL (ref 0–149)
VLDL Cholesterol Cal: 25 mg/dL (ref 5–40)

## 2019-04-24 LAB — RHEUMATOID FACTOR: Rheumatoid fact SerPl-aCnc: <10 IU/mL (ref 0.0–13.9)

## 2019-04-24 LAB — SPECIMEN STATUS REPORT

## 2019-04-24 LAB — TSH: TSH: 2.63 u[IU]/mL (ref 0.450–4.500)

## 2019-04-24 LAB — HGB A1C W/O EAG: Hgb A1c MFr Bld: 5.7 % — ABNORMAL HIGH (ref 4.8–5.6)

## 2019-04-24 SURGERY — ECHOCARDIOGRAM, TRANSESOPHAGEAL
Anesthesia: Monitor Anesthesia Care

## 2019-04-24 MED ORDER — BUTAMBEN-TETRACAINE-BENZOCAINE 2-2-14 % EX AERO
INHALATION_SPRAY | CUTANEOUS | Status: DC | PRN
  Administered 2019-04-24: 1 via TOPICAL

## 2019-04-24 MED ORDER — SODIUM CHLORIDE 0.9 % IV SOLN
INTRAVENOUS | Status: DC
  Administered 2019-04-24: 11:00:00 via INTRAVENOUS

## 2019-04-24 MED ORDER — PROPOFOL 500 MG/50ML IV EMUL
INTRAVENOUS | Status: DC | PRN
  Administered 2019-04-24: 100 ug/kg/min via INTRAVENOUS

## 2019-04-24 NOTE — Telephone Encounter (Signed)
Labs received. Thanks.

## 2019-04-24 NOTE — Anesthesia Preprocedure Evaluation (Addendum)
Anesthesia Evaluation  Patient identified by MRN, date of birth, ID band Patient awake    Reviewed: Allergy & Precautions, NPO status , Patient's Chart, lab work & pertinent test results  Airway Mallampati: II  TM Distance: >3 FB Neck ROM: Full    Dental no notable dental hx.    Pulmonary sleep apnea and Continuous Positive Airway Pressure Ventilation ,    Pulmonary exam normal breath sounds clear to auscultation       Cardiovascular hypertension, Pt. on medications and Pt. on home beta blockers + CAD and + DVT  + dysrhythmias Atrial Fibrillation  Rhythm:Irregular Rate:Tachycardia  ECG: rate 131, Atrial fibrillation with rapid ventricular response  ECHO: 1. When compared to the prior study: EF appears to be mildly reduced compared with study dated 08/06/2015. This likely is related to atrial fibrillation with RVR that his now present on this study. 2. Technically difficult study with mildly reduced EF. Recommend to recheck EF after conversion to NSR, and will need contrast. 3. The left ventricle has mildly reduced systolic function, with an ejection fraction of 45-50%. The cavity size was normal. Indeterminate diastolic function due to atrial fibrillation. 4. Mildly abnormal EF 45-50% but noted to be in atrial fibrillation with RVR during this study. Unable to exclude RWMA due to poor quality study. 5. The right ventricle has low normal systolic function. The cavity was normal. There is no increase in right ventricular wall thickness. Right ventricular systolic pressure is mildly elevated. 6. Left atrial size was mildly dilated. 7. Right atrial size was mildly dilated. 8. The mitral valve is grossly normal. Mild calcification of the mitral valve leaflet. There is mild mitral annular calcification present. 9. The tricuspid valve is grossly normal. 10. The aortic valve is tricuspid. focal calcification of the NCC of the aortic valve. No  stenosis of the aortic valve. 11. The aorta is normal unless otherwise noted. 12. The inferior vena cava was dilated in size with <50% respiratory variability.   Neuro/Psych negative neurological ROS  negative psych ROS   GI/Hepatic negative GI ROS, Neg liver ROS,   Endo/Other  Morbid obesity  Renal/GU negative Renal ROS     Musculoskeletal  (+) Arthritis , Spinal stenosis   Abdominal   Peds  Hematology HLD   Anesthesia Other Findings pre ablation for 8/25  Reproductive/Obstetrics                            Anesthesia Physical Anesthesia Plan  ASA: IV  Anesthesia Plan: MAC   Post-op Pain Management:    Induction: Intravenous  PONV Risk Score and Plan: 2 and Propofol infusion and Treatment may vary due to age or medical condition  Airway Management Planned: Nasal Cannula  Additional Equipment:   Intra-op Plan:   Post-operative Plan:   Informed Consent: I have reviewed the patients History and Physical, chart, labs and discussed the procedure including the risks, benefits and alternatives for the proposed anesthesia with the patient or authorized representative who has indicated his/her understanding and acceptance.     Dental advisory given  Plan Discussed with: CRNA  Anesthesia Plan Comments:        Anesthesia Quick Evaluation

## 2019-04-24 NOTE — Telephone Encounter (Signed)
Lab results are in Epic. Do I still need to fax them to you guys? Please advise

## 2019-04-24 NOTE — Interval H&P Note (Signed)
History and Physical Interval Note:  04/24/2019 10:43 AM  [name]  has presented today for surgery, with the diagnosis of pre ablation for 8/25.  The various methods of treatment have been discussed with the patient and family. After consideration of risks, benefits and other options for treatment, the patient has consented to  Procedure(s): TRANSESOPHAGEAL ECHOCARDIOGRAM (TEE) (N/A) as a surgical intervention.  The patient's history has been reviewed, patient examined, no change in status, stable for surgery.  I have reviewed the patient's chart and labs.  Questions were answered to the patient's satisfaction.     Tracy Miles

## 2019-04-24 NOTE — Progress Notes (Signed)
Echocardiogram 2D Echocardiogram has been performed.  Tracy Miles 04/24/2019, 11:46 AM

## 2019-04-24 NOTE — Anesthesia Postprocedure Evaluation (Signed)
Anesthesia Post Note  Patient: Tracy Miles  Procedure(s) Performed: TRANSESOPHAGEAL ECHOCARDIOGRAM (TEE) (N/A )     Patient location during evaluation: Endoscopy Anesthesia Type: MAC Level of consciousness: awake Pain management: pain level controlled Vital Signs Assessment: post-procedure vital signs reviewed and stable Respiratory status: spontaneous breathing, nonlabored ventilation, respiratory function stable and patient connected to nasal cannula oxygen Cardiovascular status: stable and blood pressure returned to baseline Postop Assessment: no apparent nausea or vomiting Anesthetic complications: no    Last Vitals:  Vitals:   04/24/19 1142 04/24/19 1152  BP: 101/64 109/72  Pulse: 96 (!) 107  Resp: 19 15  Temp: 36.7 C   SpO2: 100% 97%    Last Pain:  Vitals:   04/24/19 1152  TempSrc:   PainSc: 0-No pain                 Ryan P Ellender

## 2019-04-24 NOTE — Transfer of Care (Signed)
Immediate Anesthesia Transfer of Care Note  Patient: Tracy Miles  Procedure(s) Performed: TRANSESOPHAGEAL ECHOCARDIOGRAM (TEE) (N/A )  Patient Location: Endoscopy Unit  Anesthesia Type:MAC  Level of Consciousness: sedated and responds to stimulation  Airway & Oxygen Therapy: Patient Spontanous Breathing and Patient connected to nasal cannula oxygen  Post-op Assessment: Report given to RN, Post -op Vital signs reviewed and stable and Patient moving all extremities  Post vital signs: Reviewed and stable  Last Vitals:  Vitals Value Taken Time  BP    Temp    Pulse    Resp    SpO2      Last Pain:  Vitals:   04/24/19 1046  TempSrc: Temporal  PainSc: 0-No pain         Complications: No apparent anesthesia complications

## 2019-04-24 NOTE — Discharge Instructions (Signed)

## 2019-04-24 NOTE — Anesthesia Preprocedure Evaluation (Addendum)
Anesthesia Evaluation  Patient identified by MRN, date of birth, ID band Patient awake    Reviewed: Allergy & Precautions, NPO status , Patient's Chart, lab work & pertinent test results  Airway Mallampati: II  TM Distance: >3 FB Neck ROM: Full    Dental no notable dental hx. (+) Teeth Intact, Dental Advisory Given   Pulmonary sleep apnea and Continuous Positive Airway Pressure Ventilation ,    Pulmonary exam normal breath sounds clear to auscultation       Cardiovascular hypertension, Pt. on medications and Pt. on home beta blockers + CAD and + DVT  + dysrhythmias Atrial Fibrillation  Rhythm:Irregular Rate:Tachycardia  8/24 TEE. Normal LVF. Moderate MR. Trace TR.    Neuro/Psych negative neurological ROS  negative psych ROS   GI/Hepatic negative GI ROS, Neg liver ROS,   Endo/Other  Morbid obesity  Renal/GU negative Renal ROS     Musculoskeletal  (+) Arthritis , Spinal stenosis   Abdominal   Peds  Hematology HLD   Anesthesia Other Findings pre ablation for 8/25  Reproductive/Obstetrics                           Lab Results  Component Value Date   WBC 7.2 04/20/2019   HGB 13.7 04/20/2019   HCT 41.3 04/20/2019   MCV 93 04/20/2019   PLT 268 04/20/2019   Lab Results  Component Value Date   CREATININE 1.07 (H) 04/20/2019   BUN 18 04/20/2019   NA 143 04/20/2019   K 4.5 04/20/2019   CL 101 04/20/2019   CO2 25 04/20/2019    Anesthesia Physical  Anesthesia Plan  ASA: IV  Anesthesia Plan: General   Post-op Pain Management:    Induction: Intravenous  PONV Risk Score and Plan: 3 and Treatment may vary due to age or medical condition, Dexamethasone and Ondansetron  Airway Management Planned: Oral ETT  Additional Equipment:   Intra-op Plan:   Post-operative Plan: Extubation in OR  Informed Consent: I have reviewed the patients History and Physical, chart, labs and discussed  the procedure including the risks, benefits and alternatives for the proposed anesthesia with the patient or authorized representative who has indicated his/her understanding and acceptance.     Dental advisory given  Plan Discussed with: CRNA  Anesthesia Plan Comments:        Anesthesia Quick Evaluation

## 2019-04-24 NOTE — CV Procedure (Signed)
    Transesophageal Echocardiogram Note  Tracy Miles [id number] 1934/01/08  Procedure: Transesophageal Echocardiogram Indications: Atrial fib   Procedure Details Consent: Obtained Time Out: Verified patient identification, verified procedure, site/side was marked, verified correct patient position, special equipment/implants available, Radiology Safety Procedures followed,  medications/allergies/relevent history reviewed, required imaging and test results available.  Performed  Medications:  During this procedure the patient is administered a  Propofol drip by Mateo Flow, CRNA.   Total of 100 mg Propofol was used for the procedure   Left Ventrical:  Normal LV function   Mitral Valve: moderate MR   Aortic Valve: normal   Tricuspid Valve: trace TR   Pulmonic Valve: not well visualized   Left Atrium/ Left atrial appendage: no thrombi visualized   Atrial septum: no ASD or PFO by color flow   Aorta: normal    Complications: No apparent complications Patient did tolerate procedure well.   Thayer Headings, Brooke Bonito., MD, [facility] 04/24/2019, 11:29 AM

## 2019-04-25 ENCOUNTER — Ambulatory Visit (HOSPITAL_COMMUNITY): Payer: Medicare Other | Admitting: Certified Registered Nurse Anesthetist

## 2019-04-25 ENCOUNTER — Ambulatory Visit (HOSPITAL_COMMUNITY)
Admission: RE | Admit: 2019-04-25 | Discharge: 2019-04-26 | Disposition: A | Discharge status: Home / Self Care | Payer: Medicare Other | Attending: Internal Medicine | Admitting: Internal Medicine

## 2019-04-25 ENCOUNTER — Encounter (HOSPITAL_COMMUNITY): Payer: Self-pay | Admitting: Certified Registered Nurse Anesthetist

## 2019-04-25 ENCOUNTER — Encounter (HOSPITAL_COMMUNITY)
Admission: RE | Disposition: A | Discharge status: Home / Self Care | Payer: Self-pay | Source: Home / Self Care | Attending: Internal Medicine

## 2019-04-25 ENCOUNTER — Other Ambulatory Visit: Payer: Self-pay

## 2019-04-25 DIAGNOSIS — Z6841 Body Mass Index (BMI) 40.0 and over, adult: Secondary | ICD-10-CM | POA: Diagnosis not present

## 2019-04-25 DIAGNOSIS — I110 Hypertensive heart disease with heart failure: Secondary | ICD-10-CM | POA: Diagnosis not present

## 2019-04-25 DIAGNOSIS — Z7901 Long term (current) use of anticoagulants: Secondary | ICD-10-CM | POA: Diagnosis not present

## 2019-04-25 DIAGNOSIS — Z882 Allergy status to sulfonamides status: Secondary | ICD-10-CM | POA: Diagnosis not present

## 2019-04-25 DIAGNOSIS — I4891 Unspecified atrial fibrillation: Secondary | ICD-10-CM | POA: Diagnosis not present

## 2019-04-25 DIAGNOSIS — Z79899 Other long term (current) drug therapy: Secondary | ICD-10-CM | POA: Diagnosis not present

## 2019-04-25 DIAGNOSIS — Z86718 Personal history of other venous thrombosis and embolism: Secondary | ICD-10-CM | POA: Diagnosis not present

## 2019-04-25 DIAGNOSIS — I5032 Chronic diastolic (congestive) heart failure: Secondary | ICD-10-CM | POA: Diagnosis not present

## 2019-04-25 DIAGNOSIS — Z888 Allergy status to other drugs, medicaments and biological substances status: Secondary | ICD-10-CM | POA: Diagnosis not present

## 2019-04-25 DIAGNOSIS — E785 Hyperlipidemia, unspecified: Secondary | ICD-10-CM | POA: Insufficient documentation

## 2019-04-25 DIAGNOSIS — E669 Obesity, unspecified: Secondary | ICD-10-CM | POA: Insufficient documentation

## 2019-04-25 DIAGNOSIS — Z880 Allergy status to penicillin: Secondary | ICD-10-CM | POA: Diagnosis not present

## 2019-04-25 DIAGNOSIS — E782 Mixed hyperlipidemia: Secondary | ICD-10-CM | POA: Diagnosis not present

## 2019-04-25 DIAGNOSIS — I2510 Atherosclerotic heart disease of native coronary artery without angina pectoris: Secondary | ICD-10-CM | POA: Insufficient documentation

## 2019-04-25 DIAGNOSIS — I350 Nonrheumatic aortic (valve) stenosis: Secondary | ICD-10-CM | POA: Diagnosis not present

## 2019-04-25 DIAGNOSIS — E119 Type 2 diabetes mellitus without complications: Secondary | ICD-10-CM | POA: Insufficient documentation

## 2019-04-25 DIAGNOSIS — I4819 Other persistent atrial fibrillation: Secondary | ICD-10-CM | POA: Diagnosis not present

## 2019-04-25 DIAGNOSIS — G4733 Obstructive sleep apnea (adult) (pediatric): Secondary | ICD-10-CM | POA: Diagnosis not present

## 2019-04-25 DIAGNOSIS — M1990 Unspecified osteoarthritis, unspecified site: Secondary | ICD-10-CM | POA: Diagnosis not present

## 2019-04-25 HISTORY — PX: ATRIAL FIBRILLATION ABLATION: EP1191

## 2019-04-25 SURGERY — ATRIAL FIBRILLATION ABLATION
Anesthesia: General

## 2019-04-25 MED ORDER — SUGAMMADEX SODIUM 200 MG/2ML IV SOLN
INTRAVENOUS | Status: DC | PRN
  Administered 2019-04-25: 300 mg via INTRAVENOUS

## 2019-04-25 MED ORDER — ROCURONIUM BROMIDE 10 MG/ML (PF) SYRINGE
PREFILLED_SYRINGE | INTRAVENOUS | Status: DC | PRN
  Administered 2019-04-25: 20 mg via INTRAVENOUS
  Administered 2019-04-25: 50 mg via INTRAVENOUS

## 2019-04-25 MED ORDER — SODIUM CHLORIDE 0.9 % IV SOLN
INTRAVENOUS | Status: DC
  Administered 2019-04-25 – 2019-04-26 (×2): via INTRAVENOUS

## 2019-04-25 MED ORDER — HEPARIN SODIUM (PORCINE) 1000 UNIT/ML IJ SOLN
INTRAMUSCULAR | Status: DC | PRN
  Administered 2019-04-25: 2000 [IU] via INTRAVENOUS
  Administered 2019-04-25: 4000 [IU] via INTRAVENOUS

## 2019-04-25 MED ORDER — APIXABAN 5 MG PO TABS
5.0000 mg | ORAL_TABLET | Freq: Two times a day (BID) | ORAL | Status: DC
  Administered 2019-04-25 (×2): 5 mg via ORAL
  Filled 2019-04-25 (×2): qty 1

## 2019-04-25 MED ORDER — SODIUM CHLORIDE 0.9 % IV SOLN
INTRAVENOUS | Status: DC | PRN
  Administered 2019-04-25: 08:00:00 20 ug/min via INTRAVENOUS

## 2019-04-25 MED ORDER — ONDANSETRON HCL 4 MG/2ML IJ SOLN: 4.0000 mg | Freq: Four times a day (QID) | INTRAMUSCULAR | Status: DC | PRN

## 2019-04-25 MED ORDER — ATORVASTATIN CALCIUM 10 MG PO TABS
20.0000 mg | ORAL_TABLET | Freq: Every day | ORAL | Status: DC
  Administered 2019-04-25: 20 mg via ORAL
  Filled 2019-04-25: qty 2

## 2019-04-25 MED ORDER — SUCCINYLCHOLINE CHLORIDE 200 MG/10ML IV SOSY
PREFILLED_SYRINGE | INTRAVENOUS | Status: DC | PRN
  Administered 2019-04-25: 140 mg via INTRAVENOUS

## 2019-04-25 MED ORDER — HEPARIN (PORCINE) IN NACL 1000-0.9 UT/500ML-% IV SOLN
INTRAVENOUS | Status: DC | PRN
  Administered 2019-04-25: 500 mL

## 2019-04-25 MED ORDER — PHENYLEPHRINE 40 MCG/ML (10ML) SYRINGE FOR IV PUSH (FOR BLOOD PRESSURE SUPPORT)
PREFILLED_SYRINGE | INTRAVENOUS | Status: DC | PRN
  Administered 2019-04-25: 80 ug via INTRAVENOUS
  Administered 2019-04-25: 40 ug via INTRAVENOUS
  Administered 2019-04-25 (×3): 80 ug via INTRAVENOUS

## 2019-04-25 MED ORDER — HEPARIN SODIUM (PORCINE) 1000 UNIT/ML IJ SOLN
INTRAMUSCULAR | Status: DC | PRN
  Administered 2019-04-25: 1000 [IU] via INTRAVENOUS
  Administered 2019-04-25: 12000 [IU] via INTRAVENOUS

## 2019-04-25 MED ORDER — LISINOPRIL 10 MG PO TABS: 10.0000 mg | ORAL_TABLET | Freq: Every day | ORAL | Status: DC

## 2019-04-25 MED ORDER — BUPIVACAINE HCL (PF) 0.25 % IJ SOLN
INTRAMUSCULAR | Status: AC
  Filled 2019-04-25: qty 30

## 2019-04-25 MED ORDER — SODIUM CHLORIDE 0.9% FLUSH
3.0000 mL | Freq: Two times a day (BID) | INTRAVENOUS | Status: DC
  Administered 2019-04-25: 3 mL via INTRAVENOUS

## 2019-04-25 MED ORDER — SODIUM CHLORIDE 0.9% FLUSH: 3.0000 mL | INTRAVENOUS | Status: DC | PRN

## 2019-04-25 MED ORDER — DEXAMETHASONE SODIUM PHOSPHATE 10 MG/ML IJ SOLN
INTRAMUSCULAR | Status: DC | PRN
  Administered 2019-04-25: 10 mg via INTRAVENOUS

## 2019-04-25 MED ORDER — LIDOCAINE 2% (20 MG/ML) 5 ML SYRINGE
INTRAMUSCULAR | Status: DC | PRN
  Administered 2019-04-25: 80 mg via INTRAVENOUS

## 2019-04-25 MED ORDER — DOFETILIDE 250 MCG PO CAPS
250.0000 ug | ORAL_CAPSULE | Freq: Two times a day (BID) | ORAL | Status: DC
  Administered 2019-04-25: 22:00:00 250 ug via ORAL
  Filled 2019-04-25: qty 1

## 2019-04-25 MED ORDER — HYDROCODONE-ACETAMINOPHEN 5-325 MG PO TABS: 1.0000 | ORAL_TABLET | ORAL | Status: DC | PRN

## 2019-04-25 MED ORDER — ACETAMINOPHEN 325 MG PO TABS: 650.0000 mg | ORAL_TABLET | ORAL | Status: DC | PRN

## 2019-04-25 MED ORDER — BUMETANIDE 1 MG PO TABS
1.0000 mg | ORAL_TABLET | Freq: Every day | ORAL | Status: DC
  Filled 2019-04-25 (×2): qty 1

## 2019-04-25 MED ORDER — SODIUM CHLORIDE 0.9 % IV SOLN: 250.0000 mL | INTRAVENOUS | Status: DC | PRN

## 2019-04-25 MED ORDER — SODIUM CHLORIDE 0.9 % IV SOLN
INTRAVENOUS | Status: DC | PRN
  Administered 2019-04-25 (×2): via INTRAVENOUS

## 2019-04-25 MED ORDER — HEPARIN SODIUM (PORCINE) 1000 UNIT/ML IJ SOLN
INTRAMUSCULAR | Status: AC
  Filled 2019-04-25: qty 2

## 2019-04-25 MED ORDER — ONDANSETRON HCL 4 MG/2ML IJ SOLN
INTRAMUSCULAR | Status: DC | PRN
  Administered 2019-04-25: 4 mg via INTRAVENOUS

## 2019-04-25 MED ORDER — FENTANYL CITRATE (PF) 250 MCG/5ML IJ SOLN
INTRAMUSCULAR | Status: DC | PRN
  Administered 2019-04-25 (×2): 50 ug via INTRAVENOUS

## 2019-04-25 MED ORDER — PROPOFOL 10 MG/ML IV BOLUS
INTRAVENOUS | Status: DC | PRN
  Administered 2019-04-25: 150 mg via INTRAVENOUS

## 2019-04-25 MED ORDER — PROTAMINE SULFATE 10 MG/ML IV SOLN
INTRAVENOUS | Status: DC | PRN
  Administered 2019-04-25 (×2): 20 mg via INTRAVENOUS

## 2019-04-25 MED ORDER — HEPARIN (PORCINE) IN NACL 1000-0.9 UT/500ML-% IV SOLN
INTRAVENOUS | Status: AC
  Filled 2019-04-25: qty 500

## 2019-04-25 MED ORDER — BUPIVACAINE HCL (PF) 0.25 % IJ SOLN
INTRAMUSCULAR | Status: DC | PRN
  Administered 2019-04-25: 30 mL

## 2019-04-25 SURGICAL SUPPLY — 16 items
BLANKET WARM UNDERBOD FULL ACC (MISCELLANEOUS) ×3 IMPLANT
CATH EZ STEER THERMO NAV D-F (ABLATOR) ×3 IMPLANT
CATH MAPPNG PENTARAY F 2-6-2MM (CATHETERS) ×1 IMPLANT
CATH SMTCH THERMOCOOL SF DF (CATHETERS) ×3 IMPLANT
CATH SOUNDSTAR 3D IMAGING (CATHETERS) ×3 IMPLANT
COVER SWIFTLINK CONNECTOR (BAG) ×3 IMPLANT
NEEDLE BAYLIS TRANSSEPTAL 71CM (NEEDLE) ×3 IMPLANT
PACK EP LATEX FREE (CUSTOM PROCEDURE TRAY) ×2
PACK EP LF (CUSTOM PROCEDURE TRAY) ×1 IMPLANT
PAD PRO RADIOLUCENT 2001M-C (PAD) ×3 IMPLANT
PATCH CARTO3 (PAD) ×3 IMPLANT
PENTARAY F 2-6-2MM (CATHETERS) ×3
SHEATH AVANTI 11F 11CM (SHEATH) ×3 IMPLANT
SHEATH PINNACLE 7F 10CM (SHEATH) ×6 IMPLANT
SHEATH SWARTZ TS SL2 63CM 8.5F (SHEATH) ×3 IMPLANT
TUBING SMART ABLATE COOLFLOW (TUBING) ×3 IMPLANT

## 2019-04-25 NOTE — Progress Notes (Addendum)
Dr. Rayann Heman at bedside doing portable echo. BP cuff switched to rt lower forearm per Dr. Verita Lamb

## 2019-04-25 NOTE — Interval H&P Note (Signed)
History and Physical Interval Note:  04/25/2019 7:14 AM  Tracy Miles  has presented today for surgery, with the diagnosis of atrail fibrillation.  The various methods of treatment have been discussed with the patient and family. After consideration of risks, benefits and other options for treatment, the patient has consented to  Procedure(s): ATRIAL FIBRILLATION ABLATION (N/A) as a surgical intervention.  The patient's history has been reviewed, patient examined, no change in status, stable for surgery.  I have reviewed the patient's chart and labs.  Questions were answered to the patient's satisfaction.    TEE reviewed.  She reports compliance with eliquis without interruption.  [name]

## 2019-04-25 NOTE — Discharge Summary (Signed)
ELECTROPHYSIOLOGY PROCEDURE DISCHARGE SUMMARY    Patient ID: Tracy Miles,  MRN: [id number], DOB/AGE: [birth year] 85 y.o.  Admit date: 04/25/2019 Discharge date: 04/26/2019  Primary Care Physician: [name], MD Electrophysiologist: [name], MD  Primary Discharge Diagnosis:  Persistent atrial fibrillation status post ablation this admission  Secondary Discharge Diagnosis:  1.  CAD 2.  HTN 3.  OSA 4.  Obesity  Procedures This Admission:  1.  Electrophysiology study and radiofrequency catheter ablation on 04/25/19 by Dr [name].  This study demonstrated Atrial fibrillation upon presentation; intracardiac echo reveals severe biatrial enlargement with four separate pulmonary veins without evidence of pulmonary vein stenosis; successful electrical isolation and anatomical encircling of all four pulmonary veins with radiofrequency current.  A WACA approach was used; additional left atrial ablation was performed with a standard box lesion created along the posterior wall of the left atrium; atrial fibrillation successfully cardioverted to sinus rhythm; no early apparent complications.  Brief HPI: Tracy Miles is a 85 y.o. female with a history of persistent atrial fibrillation.  They have failed medical therapy with Tikosyn. Risks, benefits, and alternatives to catheter ablation of atrial fibrillation were reviewed with the patient who wished to proceed.  The patient underwent TEE prior to the procedure which demonstrated normal LV function and no LAA thrombus.    Hospital Course:  The patient was admitted and underwent EPS/RFCA of atrial fibrillation with details as outlined above.  They were monitored on telemetry overnight which demonstrated NSR in the 70-80s.  Groin was without complication on the day of discharge.  The patient was examined and considered to be stable for discharge.  Wound care and restrictions were reviewed with the patient.  The patient will be seen  back by [name], NP in 4 weeks and Dr Rayann Heman in 12 weeks for post ablation follow up.   This patients CHA2DS2-VASc Score and unadjusted Ischemic Stroke Rate (% per year) is equal to 7.2 % stroke rate/year from a score of 5 Above score calculated as 1 point each if present [CHF, HTN, DM, Vascular=MI/PAD/Aortic Plaque, Age if 65-74, or Female] Above score calculated as 2 points each if present [Age > 75, or Stroke/TIA/TE]   Physical Exam: Vitals:   04/25/19 1200 04/25/19 2022 04/25/19 2240 04/26/19 0700  BP: (!) 105/59 119/74  139/83  Pulse: 73 84  86  Resp: 16 (!) 21 20 18   Temp:  98.3 F (36.8 C)  97.8 F (36.6 C)  TempSrc:  Oral  Oral  SpO2: 98% 96% 98% 97%  Weight:    125.9 kg  Height:        GEN- The patient is well appearing, alert and oriented x 3 today.   HEENT: normocephalic, atraumatic; sclera clear, conjunctiva pink; hearing intact; oropharynx clear; neck supple  Lungs- Clear to ausculation bilaterally, normal work of breathing.  No wheezes, rales, rhonchi Heart- Regular rate and rhythm, no murmurs, rubs or gallops  GI- soft, non-tender, non-distended, bowel sounds present  Extremities- no clubbing, cyanosis, or edema; DP/PT/radial pulses 2+ bilaterally, groin without hematoma/bruit MS- no significant deformity or atrophy Skin- warm and dry, no rash or lesion Psych- euthymic mood, full affect Neuro- strength and sensation are intact   Labs:   Lab Results  Component Value Date   WBC 7.2 04/20/2019   HGB 13.7 04/20/2019   HCT 41.3 04/20/2019   MCV 93 04/20/2019   PLT 268 04/20/2019    Recent Labs  Lab 04/20/19 1345  NA 143  K 4.5  CL 101  CO2 25  BUN 18  CREATININE 1.07*  CALCIUM 10.0  PROT 6.8  BILITOT 1.0  ALKPHOS 99  ALT 11  AST 15  GLUCOSE 105*     Discharge Medications:  Allergies as of 04/26/2019      Reactions   Gabapentin Swelling   Lactose Intolerance (gi) Other (See Comments)   Bothers her stomach   Zebeta [bisoprolol  Fumarate] Nausea Only   Penicillins Hives, Rash   Did it involve swelling of the face/tongue/throat, SOB, or low BP? No Did it involve sudden or severe rash/hives, skin peeling, or any reaction on the inside of your mouth or nose? Yes Did you need to seek medical attention at a hospital or doctor's office? No When did it last happen? 25+ years If all above answers are "NO", may proceed with cephalosporin use.   Sulfa Antibiotics Rash      Medication List    TAKE these medications   acetaminophen 325 MG tablet Commonly known as: TYLENOL Take 325 mg by mouth daily.   apixaban 5 MG Tabs tablet Commonly known as: Eliquis TAKE ONE (1) TABLET BY MOUTH TWO (2) TIMES DAILY What changed:   how much to take  how to take this  when to take this  additional instructions   atorvastatin 20 MG tablet Commonly known as: LIPITOR TAKE 1 TABLET BY MOUTH  DAILY AT 6 PM. What changed:   how much to take  how to take this  when to take this  additional instructions   bumetanide 1 MG tablet Commonly known as: BUMEX TAKE ONE (1) TABLET BY MOUTH EVERY DAY What changed: See the new instructions.   CALCIUM 600 + D PO Take 1 tablet by mouth 2 (two) times daily.   diltiazem 120 MG 24 hr capsule Commonly known as: CARDIZEM CD Take 1 capsule (120 mg total) by mouth daily.   dofetilide 250 MCG capsule Commonly known as: TIKOSYN Take 250 mcg by mouth 2 (two) times daily.   hydroxypropyl methylcellulose / hypromellose 2.5 % ophthalmic solution Commonly known as: ISOPTO TEARS / GONIOVISC Place 1 drop into both eyes 3 (three) times daily as needed for dry eyes.   lisinopril 10 MG tablet Commonly known as: ZESTRIL Take 1 tablet (10 mg total) by mouth daily.   loratadine 10 MG tablet Commonly known as: CLARITIN Take 10 mg by mouth daily.   metoprolol succinate 25 MG 24 hr tablet Commonly known as: Toprol XL Take 1 tablet (25 mg total) by mouth 3 (three) times daily.    pantoprazole 40 MG tablet Commonly known as: Protonix Take 1 tablet (40 mg total) by mouth daily. For 1 month s/p ablation.   potassium chloride 10 MEQ tablet Commonly known as: K-DUR TAKE 1 TABLET BY MOUTH  EVERY MORNING AND 2 TABLETS BY MOUTH IN THE EVENING What changed:   how much to take  how to take this  when to take this  additional instructions   Probiotic Caps Take 1 capsule by mouth daily.   psyllium 0.52 g capsule Commonly known as: REGULOID Take 0.52 g by mouth 2 (two) times daily.       Disposition:   Follow-up Information    [facility] Follow up on 05/25/2019.   Specialty: Cardiology Why: at 2:30PM  Contact information: [address] [id number] [place] [phone]          Duration of Discharge Encounter: Greater than  30 minutes including physician time.  Jacalyn Lefevre, PA-C  04/26/2019 7:41 AM

## 2019-04-25 NOTE — Anesthesia Procedure Notes (Signed)
Procedure Name: Intubation Date/Time: 04/25/2019 7:45 AM Performed by: Harden Mo, CRNA Pre-anesthesia Checklist: Patient identified, Emergency Drugs available, Suction available and Patient being monitored Patient Re-evaluated:Patient Re-evaluated prior to induction Oxygen Delivery Method: Circle System Utilized Preoxygenation: Pre-oxygenation with 100% oxygen Induction Type: IV induction and Rapid sequence Laryngoscope Size: Miller and 2 Grade View: Grade I Tube type: Oral Tube size: 7.0 mm Number of attempts: 1 Airway Equipment and Method: Stylet and Oral airway Placement Confirmation: ETT inserted through vocal cords under direct vision,  positive ETCO2 and breath sounds checked- equal and bilateral Secured at: 22 cm Tube secured with: Tape Dental Injury: Teeth and Oropharynx as per pre-operative assessment

## 2019-04-25 NOTE — Progress Notes (Signed)
Hypotensive. BP cuff switched from rt upper arm to left lower leg. Placed in trendelenburg. Skin w/d. Obeys commands upper and lower extremities. Dr. Ola Spurr here. Dr. Rayann Heman paged. NS wide open for 500cc per Dr. Ola Spurr. Neo at 53mcg/min.

## 2019-04-25 NOTE — Progress Notes (Signed)
Site area: rt groin 3-way stopcock w/suture. Stopcock removed, suture cut and removed Site Prior to Removal:  Level 0, tender Pressure Applied For: 5 minutes Manual:   yes Patient Status During Pull:  stable Post Pull Site:  Level 0 Post Pull Instructions Given:  yes Post Pull Pulses Present: rt pt dopplered Dressing Applied:  Gauze and tegaderm Bedrest begins @ 0930 Comments:

## 2019-04-25 NOTE — Progress Notes (Addendum)
Neo decreased to 33mcg. Taken out of trendelenburg

## 2019-04-25 NOTE — Progress Notes (Signed)
Pt set up on CPAP for night rest.  Pt tolerating well at this time.  Rt will continue to monitor.

## 2019-04-25 NOTE — Transfer of Care (Signed)
Immediate Anesthesia Transfer of Care Note  Patient: Tracy Miles  Procedure(s) Performed: ATRIAL FIBRILLATION ABLATION (N/A )  Patient Location: Cath Lab  Anesthesia Type:General  Level of Consciousness: awake, alert  and oriented  Airway & Oxygen Therapy: Patient Spontanous Breathing and Patient connected to nasal cannula oxygen  Post-op Assessment: Report given to RN and Post -op Vital signs reviewed and stable  Post vital signs: Reviewed and stable  Last Vitals:  Vitals Value Taken Time  BP 126/71 04/25/19 1016  Temp 36.3 C 04/25/19 0957  Pulse 69 04/25/19 1017  Resp 16 04/25/19 1017  SpO2 100 % 04/25/19 1017  Vitals shown include unvalidated device data.  Last Pain:  Vitals:   04/25/19 1017  TempSrc:   PainSc: 8          Complications: No apparent anesthesia complications

## 2019-04-25 NOTE — Progress Notes (Signed)
Neo decreased to 50mcg

## 2019-04-25 NOTE — Anesthesia Postprocedure Evaluation (Signed)
Anesthesia Post Note  Patient: Tracy Miles  Procedure(s) Performed: ATRIAL FIBRILLATION ABLATION (N/A )     Patient location during evaluation: PACU Anesthesia Type: General Level of consciousness: awake and alert Pain management: pain level controlled Vital Signs Assessment: post-procedure vital signs reviewed and stable Respiratory status: spontaneous breathing, nonlabored ventilation, respiratory function stable and patient connected to nasal cannula oxygen Cardiovascular status: blood pressure returned to baseline and stable Postop Assessment: no apparent nausea or vomiting Anesthetic complications: no    Last Vitals:  Vitals:   04/25/19 1145 04/25/19 1200  BP: (!) 97/56 (!) 105/59  Pulse: 74 73  Resp: 18 16  Temp:    SpO2: 98% 98%    Last Pain:  Vitals:   04/25/19 1027  TempSrc: Temporal  PainSc:                  Tiajuana Amass

## 2019-04-25 NOTE — Progress Notes (Signed)
Neo off. Received 250cc bolus total.

## 2019-04-26 ENCOUNTER — Telehealth: Payer: Medicare Other | Admitting: Internal Medicine

## 2019-04-26 DIAGNOSIS — Z79899 Other long term (current) drug therapy: Secondary | ICD-10-CM | POA: Diagnosis not present

## 2019-04-26 DIAGNOSIS — G4733 Obstructive sleep apnea (adult) (pediatric): Secondary | ICD-10-CM | POA: Diagnosis not present

## 2019-04-26 DIAGNOSIS — E119 Type 2 diabetes mellitus without complications: Secondary | ICD-10-CM | POA: Diagnosis not present

## 2019-04-26 DIAGNOSIS — I2510 Atherosclerotic heart disease of native coronary artery without angina pectoris: Secondary | ICD-10-CM | POA: Diagnosis not present

## 2019-04-26 DIAGNOSIS — Z7901 Long term (current) use of anticoagulants: Secondary | ICD-10-CM | POA: Diagnosis not present

## 2019-04-26 DIAGNOSIS — I110 Hypertensive heart disease with heart failure: Secondary | ICD-10-CM | POA: Diagnosis not present

## 2019-04-26 DIAGNOSIS — I350 Nonrheumatic aortic (valve) stenosis: Secondary | ICD-10-CM | POA: Diagnosis not present

## 2019-04-26 DIAGNOSIS — Z888 Allergy status to other drugs, medicaments and biological substances status: Secondary | ICD-10-CM | POA: Diagnosis not present

## 2019-04-26 DIAGNOSIS — E669 Obesity, unspecified: Secondary | ICD-10-CM | POA: Diagnosis not present

## 2019-04-26 DIAGNOSIS — Z882 Allergy status to sulfonamides status: Secondary | ICD-10-CM | POA: Diagnosis not present

## 2019-04-26 DIAGNOSIS — Z880 Allergy status to penicillin: Secondary | ICD-10-CM | POA: Diagnosis not present

## 2019-04-26 DIAGNOSIS — I4819 Other persistent atrial fibrillation: Secondary | ICD-10-CM | POA: Diagnosis not present

## 2019-04-26 LAB — POCT ACTIVATED CLOTTING TIME
Activated Clotting Time: 263 seconds
Activated Clotting Time: 312 seconds

## 2019-04-26 MED ORDER — PANTOPRAZOLE SODIUM 40 MG PO TBEC: 40.0000 mg | DELAYED_RELEASE_TABLET | Freq: Every day | ORAL | 0 refills | Status: DC

## 2019-04-26 NOTE — Plan of Care (Signed)
  Problem: Education: Goal: Understanding of disease, treatment, and recovery process will improve Outcome: Progressing   Problem: Cardiac: Goal: Ability to maintain adequate cardiovascular perfusion will improve Outcome: Progressing Goal: Vascular access site(s) Level 0-1 will be maintained Outcome: Progressing  Site is a level 1 with bruising Problem: Cardiac: Goal: Vascular access site(s) Level 0-1 will be maintained Outcome: Progressing  Pt's site oozed earlier on day shift but no issues throughout the day. Site a level 1   Problem: Health Behavior/ Discharge Planning: Goal: Ability to safely manage health related needs after discharge Outcome: Progressing

## 2019-04-26 NOTE — Discharge Instructions (Signed)
No driving for 4 days. No lifting over 5 lbs for 1 week. No sexual activity for 1 week. You may return to work in 1 week. Keep procedure site clean & dry. If you notice increased pain, swelling, bleeding or pus, call/return!  You may shower, but no soaking baths/hot tubs/pools for 1 week.  ° ° °You have an appointment set up with the [facility].  Multiple studies have shown that being followed by a dedicated atrial fibrillation clinic in addition to the standard care you receive from your other physicians improves health. We believe that enrollment in the atrial fibrillation clinic will allow us to better care for you.  ° °The phone number to the [facility]ibrillation Clinic is [phone]. The clinic is staffed Monday through Friday from 8:30am to 5pm. ° °Parking Directions: The clinic is located in the Heart and Vascular Building connected to [facility]. °1)From [address] turn on to [address] and go to the 3rd entrance  (Heart and Vascular entrance) on the right. °2)Look to the right for Heart &Vascular Parking Garage. °3)A code for the entrance is required please call the clinic to receive this.   °4)Take the elevators to the 1st floor. Registration is in the room with the glass walls at the end of the hallway. ° °If you have any trouble parking or locating the clinic, please don’t hesitate to call [phone]. ° ° °

## 2019-05-25 ENCOUNTER — Ambulatory Visit (HOSPITAL_COMMUNITY)
Admission: RE | Admit: 2019-05-25 | Discharge: 2019-05-25 | Disposition: A | Discharge status: Home / Self Care | Payer: Medicare Other | Source: Ambulatory Visit | Attending: Nurse Practitioner | Admitting: Nurse Practitioner

## 2019-05-25 ENCOUNTER — Other Ambulatory Visit: Payer: Self-pay

## 2019-05-25 ENCOUNTER — Encounter (HOSPITAL_COMMUNITY): Payer: Self-pay | Admitting: Nurse Practitioner

## 2019-05-25 VITALS — BP 118/76 | HR 130 | Ht 63.0 in | Wt 271.2 lb

## 2019-05-25 DIAGNOSIS — I4891 Unspecified atrial fibrillation: Secondary | ICD-10-CM | POA: Diagnosis present

## 2019-05-25 DIAGNOSIS — E785 Hyperlipidemia, unspecified: Secondary | ICD-10-CM | POA: Insufficient documentation

## 2019-05-25 DIAGNOSIS — Z7901 Long term (current) use of anticoagulants: Secondary | ICD-10-CM | POA: Diagnosis not present

## 2019-05-25 DIAGNOSIS — G4733 Obstructive sleep apnea (adult) (pediatric): Secondary | ICD-10-CM | POA: Insufficient documentation

## 2019-05-25 DIAGNOSIS — I4892 Unspecified atrial flutter: Secondary | ICD-10-CM | POA: Insufficient documentation

## 2019-05-25 DIAGNOSIS — I4819 Other persistent atrial fibrillation: Secondary | ICD-10-CM | POA: Insufficient documentation

## 2019-05-25 DIAGNOSIS — I350 Nonrheumatic aortic (valve) stenosis: Secondary | ICD-10-CM | POA: Diagnosis not present

## 2019-05-25 DIAGNOSIS — Z96652 Presence of left artificial knee joint: Secondary | ICD-10-CM | POA: Insufficient documentation

## 2019-05-25 DIAGNOSIS — Z79899 Other long term (current) drug therapy: Secondary | ICD-10-CM | POA: Diagnosis not present

## 2019-05-25 DIAGNOSIS — I10 Essential (primary) hypertension: Secondary | ICD-10-CM | POA: Insufficient documentation

## 2019-05-25 DIAGNOSIS — I2510 Atherosclerotic heart disease of native coronary artery without angina pectoris: Secondary | ICD-10-CM | POA: Insufficient documentation

## 2019-05-25 DIAGNOSIS — Z9889 Other specified postprocedural states: Secondary | ICD-10-CM | POA: Diagnosis not present

## 2019-05-25 DIAGNOSIS — Z86718 Personal history of other venous thrombosis and embolism: Secondary | ICD-10-CM | POA: Insufficient documentation

## 2019-05-25 MED ORDER — METOPROLOL SUCCINATE ER 25 MG PO TB24: 37.5000 mg | ORAL_TABLET | Freq: Two times a day (BID) | ORAL | 3 refills | Status: DC

## 2019-05-25 NOTE — Patient Instructions (Signed)
Metoprolol 37.5mg  twice a day

## 2019-05-25 NOTE — Progress Notes (Signed)
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 female who presents today for  electrophysiology follow up ablation one month ago. She had developed medicine refractory atrial fibrillation. She states that she had  hematoma of the abdomen and both groins that gradually cleared.  She continued  on tikosyn.  She has had intermittent afib since the procedure and is afib today. She has been able to return to exercising in the pool and this has made her feel better. She is on 25 mg Toprol tid and the middle dose is making her tired. No swallowing issues.  Today, she denies symptoms of palpitations, chest pain,   lower extremity edema, dizziness, presyncope, or syncope.  The patient is otherwise without complaint today.   Past Medical History:  Diagnosis Date  . Anemia   . Aortic stenosis    Very mild  . Benign paroxysmal positional vertigo 12/17/2013  . Chicken pox as a child  . Colon polyp   . Coronary artery disease   . DDD (degenerative disc disease) 11/16/2013  . Hyperlipidemia   . Hypertension   . Iron deficiency anemia 11/13/2013  . Mumps child and teenager  . OA (osteoarthritis) 11/19/2013   S/p L TKR  . Obesity   . OSA on CPAP   . Pancreatitis    post hysterectomy  . Persistent atrial fibrillation   . Personal history of colonic polyps 02/25/2014  . Personal history of DVT (deep vein thrombosis) X 2   "left"  . Sleep apnea   . Spinal stenosis    Past Surgical History:  Procedure Laterality Date  . APPENDECTOMY    . ATRIAL FIBRILLATION ABLATION N/A 04/25/2019   Procedure: ATRIAL FIBRILLATION ABLATION;  Surgeon: [name], MD;  Location: [place] [facility];  Service: Cardiovascular;  Laterality: N/A;  . CARDIOVERSION N/A 08/09/2015   Procedure: CARDIOVERSION;  Surgeon: Jerline Pain, MD;  Location: [facility] ENDOSCOPY;  Service: Cardiovascular;  Laterality: N/A;  . CARDIOVERSION N/A 09/30/2018   Procedure: CARDIOVERSION;  Surgeon: [name], MD;  Location: [facility] ENDOSCOPY;  Service: Cardiovascular;  Laterality: N/A;  . CARDIOVERSION N/A 02/27/2019   Procedure: CARDIOVERSION;  Surgeon: Buford Dresser, MD;  Location: [facility] ENDOSCOPY;  Service: Cardiovascular;  Laterality: N/A;  . CATARACT EXTRACTION W/ INTRAOCULAR LENS  IMPLANT, BILATERAL Bilateral 2016  . CYST REMOVAL HAND Bilateral 1990's   "played to much golf"  . DILATION AND CURETTAGE OF UTERUS    . LUMBAR LAMINECTOMY  40 yrs ago   "L3-4"  . MENISCUS REPAIR Bilateral 2000 and 2010  . REPLACEMENT TOTAL KNEE Left 2013  . TEE WITHOUT CARDIOVERSION N/A 04/24/2019   Procedure: TRANSESOPHAGEAL ECHOCARDIOGRAM (TEE);  Surgeon: Acie Fredrickson Wonda Cheng, MD;  Location: [place];  Service: Cardiovascular;  Laterality: N/A;  . TONSILECTOMY, ADENOIDECTOMY, BILATERAL MYRINGOTOMY AND TUBES  child  . TOTAL ABDOMINAL HYSTERECTOMY  1995   had 2 tumors- benign  . TUBAL LIGATION  85 years old    ROS- all systems are reviewed and negatives except as per HPI above  Current Outpatient Medications  Medication Sig Dispense Refill  . acetaminophen (TYLENOL) 325 MG tablet Take 325 mg by mouth daily.     Marland Kitchen apixaban (ELIQUIS) 5 MG TABS tablet TAKE ONE (1) TABLET BY MOUTH TWO (2) TIMES DAILY (Patient taking differently: Take 5 mg by mouth 2 (two) times daily. ) 180 tablet 1  . atorvastatin (LIPITOR) 20 MG tablet TAKE 1 TABLET BY MOUTH  DAILY AT 6 PM. (Patient taking differently: Take 20 mg by mouth every evening. ) 90 tablet 2  . bumetanide (BUMEX) 1 MG tablet TAKE ONE (1) TABLET BY MOUTH EVERY DAY (Patient taking differently: Take 1 mg by mouth daily. TAKE ONE (1) TABLET BY MOUTH EVERY DAY) 90 tablet 1  . Calcium Carb-Cholecalciferol (CALCIUM 600 + D PO) Take 1 tablet by mouth 2 (two) times daily.    Marland Kitchen diltiazem (CARDIZEM CD) 120 MG 24 hr capsule Take 1 capsule (120 mg total) by mouth daily. 60 capsule 6  . dofetilide (TIKOSYN) 250 MCG capsule Take 250 mcg by mouth 2 (two) times daily.    .  hydroxypropyl methylcellulose / hypromellose (ISOPTO TEARS / GONIOVISC) 2.5 % ophthalmic solution Place 1 drop into both eyes 3 (three) times daily as needed for dry eyes.    Marland Kitchen lisinopril (ZESTRIL) 10 MG tablet Take 1 tablet (10 mg total) by mouth daily. 90 tablet 1  . loratadine (CLARITIN) 10 MG tablet Take 10 mg by mouth daily.     . metoprolol succinate (TOPROL XL) 25 MG 24 hr tablet Take 1.5 tablets (37.5 mg total) by mouth 2 (two) times daily. 60 tablet 3  . pantoprazole (PROTONIX) 40 MG tablet Take 1 tablet (40 mg total) by mouth daily. For 1 month s/p ablation. 30 tablet 0  . potassium chloride (K-DUR) 10 MEQ tablet TAKE 1 TABLET BY MOUTH  EVERY MORNING AND 2 TABLETS BY MOUTH IN THE EVENING (Patient taking differently: Take 10-20 mEq by mouth See admin instructions. Take 10 meq in the morning and 20 meq in the evening) 270 tablet 2  . Probiotic CAPS Take 1 capsule by mouth daily.    . psyllium (REGULOID) 0.52 g capsule Take 0.52 g by mouth 2 (two) times daily.      No current facility-administered medications for this encounter.     Physical Exam: Vitals:   05/25/19 1439  BP: 118/76  Pulse: (!) 130  Weight: 123 kg  Height: 5\' 3"  (1.6 m)    GEN- The patient is well appearing, alert and oriented x 3 today.   Head- normocephalic, atraumatic Eyes-  Sclera clear, conjunctiva pink Ears- hearing intact Oropharynx- clear Lungs- Clear to ausculation bilaterally, normal work of breathing Heart- tachycardic irregular rhythm GI- soft, NT, ND, + BS Extremities- no clubbing, cyanosis, or edema  Wt Readings from Last 3 Encounters:  05/25/19 123 kg  04/26/19 125.9 kg  04/24/19 124.7 kg    EKG tracing ordered today is personally reviewed and shows atrial flutter at 130 bpm   Recent echo is also reviewed  Assessment and Plan:  1. Persistent atrial fibrillation S/p afib ablation Is in atrial flutter today, she will let us know if it persists  for several days so we can cardiovert  Usually is having intermittent afib/flutte, once a day lasting for about one hour Stop middle day dose of toprol  and increase am/pm dose  to 37.5 mg bid  Continue Tikosyn bid   2.OSA She reports compliance with CPAP  3. HTN Stable No change required today  F/u with Dr. Rayann Heman  11/30   Geroge Baseman. Ezra Denne, [facility] [address] [place], [place] [address] [phone]

## 2019-06-02 ENCOUNTER — Encounter (HOSPITAL_COMMUNITY): Payer: Self-pay | Admitting: Nurse Practitioner

## 2019-06-02 ENCOUNTER — Ambulatory Visit (HOSPITAL_COMMUNITY)
Admission: RE | Admit: 2019-06-02 | Discharge: 2019-06-02 | Disposition: A | Discharge status: Home / Self Care | Payer: Medicare Other | Source: Ambulatory Visit | Attending: Nurse Practitioner | Admitting: Nurse Practitioner

## 2019-06-02 ENCOUNTER — Other Ambulatory Visit: Payer: Self-pay

## 2019-06-02 VITALS — BP 138/88 | HR 123 | Ht 63.0 in | Wt 272.8 lb

## 2019-06-02 DIAGNOSIS — Z79899 Other long term (current) drug therapy: Secondary | ICD-10-CM | POA: Insufficient documentation

## 2019-06-02 DIAGNOSIS — G4733 Obstructive sleep apnea (adult) (pediatric): Secondary | ICD-10-CM | POA: Diagnosis not present

## 2019-06-02 DIAGNOSIS — D509 Iron deficiency anemia, unspecified: Secondary | ICD-10-CM | POA: Insufficient documentation

## 2019-06-02 DIAGNOSIS — I10 Essential (primary) hypertension: Secondary | ICD-10-CM | POA: Insufficient documentation

## 2019-06-02 DIAGNOSIS — I350 Nonrheumatic aortic (valve) stenosis: Secondary | ICD-10-CM | POA: Insufficient documentation

## 2019-06-02 DIAGNOSIS — Z86718 Personal history of other venous thrombosis and embolism: Secondary | ICD-10-CM | POA: Diagnosis not present

## 2019-06-02 DIAGNOSIS — I4892 Unspecified atrial flutter: Secondary | ICD-10-CM | POA: Insufficient documentation

## 2019-06-02 DIAGNOSIS — Z7901 Long term (current) use of anticoagulants: Secondary | ICD-10-CM | POA: Diagnosis not present

## 2019-06-02 DIAGNOSIS — M1990 Unspecified osteoarthritis, unspecified site: Secondary | ICD-10-CM | POA: Insufficient documentation

## 2019-06-02 DIAGNOSIS — E785 Hyperlipidemia, unspecified: Secondary | ICD-10-CM | POA: Insufficient documentation

## 2019-06-02 DIAGNOSIS — I2510 Atherosclerotic heart disease of native coronary artery without angina pectoris: Secondary | ICD-10-CM | POA: Diagnosis not present

## 2019-06-02 DIAGNOSIS — I4819 Other persistent atrial fibrillation: Secondary | ICD-10-CM | POA: Diagnosis not present

## 2019-06-02 LAB — BASIC METABOLIC PANEL
Anion gap: 10 (ref 5–15)
BUN: 17 mg/dL (ref 8–23)
CO2: 24 mmol/L (ref 22–32)
Calcium: 9.4 mg/dL (ref 8.9–10.3)
Chloride: 104 mmol/L (ref 98–111)
Creatinine, Ser: 1.07 mg/dL — ABNORMAL HIGH (ref 0.44–1.00)
GFR calc Af Amer: 56 mL/min — ABNORMAL LOW (ref >60)
GFR calc non Af Amer: 48 mL/min — ABNORMAL LOW (ref >60)
Glucose, Bld: 109 mg/dL — ABNORMAL HIGH (ref 70–99)
Potassium: 4 mmol/L (ref 3.5–5.1)
Sodium: 138 mmol/L (ref 135–145)

## 2019-06-02 LAB — CBC
HCT: 39.8 % (ref 36.0–46.0)
Hemoglobin: 12.6 g/dL (ref 12.0–15.0)
MCH: 30.5 pg (ref 26.0–34.0)
MCHC: 31.7 g/dL (ref 30.0–36.0)
MCV: 96.4 fL (ref 80.0–100.0)
Platelets: 260 10*3/uL (ref 150–400)
RBC: 4.13 MIL/uL (ref 3.87–5.11)
RDW: 13.2 % (ref 11.5–15.5)
WBC: 6.9 10*3/uL (ref 4.0–10.5)
nRBC: 0 % (ref 0.0–0.2)

## 2019-06-02 LAB — MAGNESIUM: Magnesium: 2 mg/dL (ref 1.7–2.4)

## 2019-06-02 NOTE — Addendum Note (Signed)
Encounter addended by: Sherran Needs, NP on: 06/02/2019 12:18 PM  Actions taken: Clinical Note Signed

## 2019-06-02 NOTE — Progress Notes (Addendum)
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 female who presents today for  electrophysiology follow up ablation 6 weks ago. She had developed medicine refractory atrial fibrillation. She had  hematoma of the abdomen and both groins that gradually cleared. She continued  on tikosyn.  She has had intermittent afib since the procedure and was in afib  at one month f/u. She continued to watch her heart rates for the last couple of weeks and is consistently out of rhythm. She has been able to return to exercising in the pool and this has made her feel better. Her v rates are quite elevated in the 120's. Higher does of BB in the past have not helped and increased dose ofdiltiazem increases her swelling.  No swallowing issues since the procedure..  Today, she denies symptoms of palpitations, chest pain,   lower extremity edema, dizziness, presyncope, or syncope.  The patient is otherwise without complaint today.   Past Medical History:  Diagnosis Date  . Anemia   . Aortic stenosis    Very mild  . Benign paroxysmal positional vertigo 12/17/2013  . Chicken pox as a child  . Colon polyp   . Coronary artery disease   . DDD (degenerative disc disease) 11/16/2013  . Hyperlipidemia   . Hypertension   . Iron deficiency anemia 11/13/2013  . Mumps child and teenager  . OA (osteoarthritis) 11/19/2013   S/p L TKR  . Obesity   . OSA on CPAP   . Pancreatitis    post hysterectomy  . Persistent atrial fibrillation ([place])   . Personal history of colonic polyps 02/25/2014  . Personal history of DVT (deep vein thrombosis) X 2   "left"  . Sleep apnea   . Spinal stenosis    Past Surgical History:  Procedure Laterality Date  . APPENDECTOMY    . ATRIAL FIBRILLATION ABLATION N/A 04/25/2019   Procedure: ATRIAL FIBRILLATION ABLATION;  Surgeon: [name], MD;  Location: [place] [facility];  Service: Cardiovascular;  Laterality: N/A;  . CARDIOVERSION N/A 08/09/2015   Procedure: CARDIOVERSION;  Surgeon: Jerline Pain, MD;  Location: [facility] ENDOSCOPY;  Service: Cardiovascular;  Laterality: N/A;  . CARDIOVERSION N/A 09/30/2018   Procedure: CARDIOVERSION;  Surgeon: [name], MD;  Location: [facility] ENDOSCOPY;  Service: Cardiovascular;  Laterality: N/A;  . CARDIOVERSION N/A 02/27/2019   Procedure: CARDIOVERSION;  Surgeon: Buford Dresser, MD;  Location: [facility] ENDOSCOPY;  Service: Cardiovascular;  Laterality: N/A;  . CATARACT EXTRACTION W/ INTRAOCULAR LENS  IMPLANT, BILATERAL Bilateral 2016  . CYST REMOVAL HAND Bilateral 1990's   "played to much golf"  . DILATION AND CURETTAGE OF UTERUS    . LUMBAR LAMINECTOMY  40 yrs ago   "L3-4"  . MENISCUS REPAIR Bilateral 2000 and 2010  . REPLACEMENT TOTAL KNEE Left 2013  . TEE WITHOUT CARDIOVERSION N/A 04/24/2019   Procedure: TRANSESOPHAGEAL ECHOCARDIOGRAM (TEE);  Surgeon: Acie Fredrickson Wonda Cheng, MD;  Location: [place];  Service: Cardiovascular;  Laterality: N/A;  . TONSILECTOMY, ADENOIDECTOMY, BILATERAL MYRINGOTOMY AND TUBES  child  . TOTAL ABDOMINAL HYSTERECTOMY  1995   had 2 tumors- benign  . TUBAL LIGATION  85 years old    ROS- all systems are reviewed and negatives except as per HPI above  Current Outpatient Medications  Medication Sig Dispense Refill  . acetaminophen (TYLENOL) 325 MG tablet Take 325 mg by mouth daily.     Marland Kitchen apixaban (ELIQUIS) 5 MG TABS tablet TAKE ONE (1) TABLET  BY MOUTH TWO (2) TIMES DAILY (Patient taking differently: Take 5 mg by mouth 2 (two) times daily. ) 180 tablet 1  . atorvastatin (LIPITOR) 20 MG tablet TAKE 1 TABLET BY MOUTH  DAILY AT 6 PM. (Patient taking differently: Take 20 mg by mouth every evening. ) 90 tablet 2  . bumetanide (BUMEX) 1 MG tablet TAKE ONE (1) TABLET BY MOUTH EVERY DAY (Patient taking differently: Take 1 mg by mouth daily. TAKE ONE (1) TABLET BY MOUTH EVERY DAY) 90 tablet 1  . Calcium Carb-Cholecalciferol (CALCIUM 600 + D PO) Take 1 tablet by mouth 2 (two) times  daily.    Marland Kitchen diltiazem (CARDIZEM CD) 120 MG 24 hr capsule Take 1 capsule (120 mg total) by mouth daily. 60 capsule 6  . dofetilide (TIKOSYN) 250 MCG capsule Take 250 mcg by mouth 2 (two) times daily.    . hydroxypropyl methylcellulose / hypromellose (ISOPTO TEARS / GONIOVISC) 2.5 % ophthalmic solution Place 1 drop into both eyes 3 (three) times daily as needed for dry eyes.    Marland Kitchen lisinopril (ZESTRIL) 10 MG tablet Take 1 tablet (10 mg total) by mouth daily. 90 tablet 1  . loratadine (CLARITIN) 10 MG tablet Take 10 mg by mouth daily.     . metoprolol succinate (TOPROL XL) 25 MG 24 hr tablet Take 1.5 tablets (37.5 mg total) by mouth 2 (two) times daily. 60 tablet 3  . potassium chloride (K-DUR) 10 MEQ tablet TAKE 1 TABLET BY MOUTH  EVERY MORNING AND 2 TABLETS BY MOUTH IN THE EVENING (Patient taking differently: Take 10-20 mEq by mouth See admin instructions. Take 10 meq in the morning and 20 meq in the evening) 270 tablet 2  . Probiotic CAPS Take 1 capsule by mouth daily.    . psyllium (REGULOID) 0.52 g capsule Take 0.52 g by mouth 2 (two) times daily.     . pantoprazole (PROTONIX) 40 MG tablet Take 1 tablet (40 mg total) by mouth daily. For 1 month s/p ablation. 30 tablet 0   No current facility-administered medications for this encounter.     Physical Exam: Vitals:   06/02/19 1027  BP: 138/88  Pulse: (!) 123  Weight: 123.7 kg  Height: 5\' 3"  (1.6 m)    GEN- The patient is well appearing, alert and oriented x 3 today.   Head- normocephalic, atraumatic Eyes-  Sclera clear, conjunctiva pink Ears- hearing intact Oropharynx- clear Lungs- Clear to ausculation bilaterally, normal work of breathing Heart- tachycardic irregular rhythm GI- soft, NT, ND, + BS Extremities- no clubbing, cyanosis, or edema  Wt Readings from Last 3 Encounters:  06/02/19 123.7 kg  05/25/19 123 kg  04/26/19 125.9 kg    EKG -afib at 123 bpm   Recent echo is also reviewed  Assessment and Plan:  1. Persistent  atrial fibrillation S/p afib ablation 04/25/19 Has had afib with RVR for several weeks Did have SR for a couple of weeks following the procedure Will plan for cardioversion  Continue Tikosyndiltaizem /metorpolol at current doses  No missed doses of anticoagulation  Bmet/cbc/covid ordered  2.OSA She reports compliance with CPAP  3. HTN Stable No change required today  F/u  here one week following cardioversion   Butch Penny C. , [facility] [address] [place], [place] [address] [phone]

## 2019-06-02 NOTE — Patient Instructions (Signed)
Cardioversion scheduled for Wednesday, October 7th  - Arrive at the [facility] and go to admitting at [facility] not eat or drink anything after midnight the night prior to your procedure.  - Take all your morning medication with a sip of water prior to arrival.  - You will not be able to drive home after your procedure.

## 2019-06-02 NOTE — H&P (View-Only) (Signed)
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 female who presents today for  electrophysiology follow up ablation 6 weks ago. She had developed medicine refractory atrial fibrillation. She had  hematoma of the abdomen and both groins that gradually cleared. She continued  on tikosyn.  She has had intermittent afib since the procedure and was in afib  at one month f/u. She continued to watch her heart rates for the last couple of weeks and is consistently out of rhythm. She has been able to return to exercising in the pool and this has made her feel better. Her v rates are quite elevated in the 120's. Higher does of BB in the past have not helped and increased dose ofdiltiazem increases her swelling.  No swallowing issues since the procedure..  Today, she denies symptoms of palpitations, chest pain,   lower extremity edema, dizziness, presyncope, or syncope.  The patient is otherwise without complaint today.   Past Medical History:  Diagnosis Date  . Anemia   . Aortic stenosis    Very mild  . Benign paroxysmal positional vertigo 12/17/2013  . Chicken pox as a child  . Colon polyp   . Coronary artery disease   . DDD (degenerative disc disease) 11/16/2013  . Hyperlipidemia   . Hypertension   . Iron deficiency anemia 11/13/2013  . Mumps child and teenager  . OA (osteoarthritis) 11/19/2013   S/p L TKR  . Obesity   . OSA on CPAP   . Pancreatitis    post hysterectomy  . Persistent atrial fibrillation ([place])   . Personal history of colonic polyps 02/25/2014  . Personal history of DVT (deep vein thrombosis) X 2   "left"  . Sleep apnea   . Spinal stenosis    Past Surgical History:  Procedure Laterality Date  . APPENDECTOMY    . ATRIAL FIBRILLATION ABLATION N/A 04/25/2019   Procedure: ATRIAL FIBRILLATION ABLATION;  Surgeon: [name], MD;  Location: [place] [facility];  Service: Cardiovascular;  Laterality: N/A;  . CARDIOVERSION N/A 08/09/2015   Procedure: CARDIOVERSION;  Surgeon: Jerline Pain, MD;  Location: [facility] ENDOSCOPY;  Service: Cardiovascular;  Laterality: N/A;  . CARDIOVERSION N/A 09/30/2018   Procedure: CARDIOVERSION;  Surgeon: [name], MD;  Location: [facility] ENDOSCOPY;  Service: Cardiovascular;  Laterality: N/A;  . CARDIOVERSION N/A 02/27/2019   Procedure: CARDIOVERSION;  Surgeon: Buford Dresser, MD;  Location: [facility] ENDOSCOPY;  Service: Cardiovascular;  Laterality: N/A;  . CATARACT EXTRACTION W/ INTRAOCULAR LENS  IMPLANT, BILATERAL Bilateral 2016  . CYST REMOVAL HAND Bilateral 1990's   "played to much golf"  . DILATION AND CURETTAGE OF UTERUS    . LUMBAR LAMINECTOMY  40 yrs ago   "L3-4"  . MENISCUS REPAIR Bilateral 2000 and 2010  . REPLACEMENT TOTAL KNEE Left 2013  . TEE WITHOUT CARDIOVERSION N/A 04/24/2019   Procedure: TRANSESOPHAGEAL ECHOCARDIOGRAM (TEE);  Surgeon: Acie Fredrickson Wonda Cheng, MD;  Location: [place];  Service: Cardiovascular;  Laterality: N/A;  . TONSILECTOMY, ADENOIDECTOMY, BILATERAL MYRINGOTOMY AND TUBES  child  . TOTAL ABDOMINAL HYSTERECTOMY  1995   had 2 tumors- benign  . TUBAL LIGATION  85 years old    ROS- all systems are reviewed and negatives except as per HPI above  Current Outpatient Medications  Medication Sig Dispense Refill  . acetaminophen (TYLENOL) 325 MG tablet Take 325 mg by mouth daily.     Marland Kitchen apixaban (ELIQUIS) 5 MG TABS tablet TAKE ONE (1) TABLET  BY MOUTH TWO (2) TIMES DAILY (Patient taking differently: Take 5 mg by mouth 2 (two) times daily. ) 180 tablet 1  . atorvastatin (LIPITOR) 20 MG tablet TAKE 1 TABLET BY MOUTH  DAILY AT 6 PM. (Patient taking differently: Take 20 mg by mouth every evening. ) 90 tablet 2  . bumetanide (BUMEX) 1 MG tablet TAKE ONE (1) TABLET BY MOUTH EVERY DAY (Patient taking differently: Take 1 mg by mouth daily. TAKE ONE (1) TABLET BY MOUTH EVERY DAY) 90 tablet 1  . Calcium Carb-Cholecalciferol (CALCIUM 600 + D PO) Take 1 tablet by mouth 2 (two) times  daily.    Marland Kitchen diltiazem (CARDIZEM CD) 120 MG 24 hr capsule Take 1 capsule (120 mg total) by mouth daily. 60 capsule 6  . dofetilide (TIKOSYN) 250 MCG capsule Take 250 mcg by mouth 2 (two) times daily.    . hydroxypropyl methylcellulose / hypromellose (ISOPTO TEARS / GONIOVISC) 2.5 % ophthalmic solution Place 1 drop into both eyes 3 (three) times daily as needed for dry eyes.    Marland Kitchen lisinopril (ZESTRIL) 10 MG tablet Take 1 tablet (10 mg total) by mouth daily. 90 tablet 1  . loratadine (CLARITIN) 10 MG tablet Take 10 mg by mouth daily.     . metoprolol succinate (TOPROL XL) 25 MG 24 hr tablet Take 1.5 tablets (37.5 mg total) by mouth 2 (two) times daily. 60 tablet 3  . potassium chloride (K-DUR) 10 MEQ tablet TAKE 1 TABLET BY MOUTH  EVERY MORNING AND 2 TABLETS BY MOUTH IN THE EVENING (Patient taking differently: Take 10-20 mEq by mouth See admin instructions. Take 10 meq in the morning and 20 meq in the evening) 270 tablet 2  . Probiotic CAPS Take 1 capsule by mouth daily.    . psyllium (REGULOID) 0.52 g capsule Take 0.52 g by mouth 2 (two) times daily.     . pantoprazole (PROTONIX) 40 MG tablet Take 1 tablet (40 mg total) by mouth daily. For 1 month s/p ablation. 30 tablet 0   No current facility-administered medications for this encounter.     Physical Exam: Vitals:   06/02/19 1027  BP: 138/88  Pulse: (!) 123  Weight: 123.7 kg  Height: 5\' 3"  (1.6 m)    GEN- The patient is well appearing, alert and oriented x 3 today.   Head- normocephalic, atraumatic Eyes-  Sclera clear, conjunctiva pink Ears- hearing intact Oropharynx- clear Lungs- Clear to ausculation bilaterally, normal work of breathing Heart- tachycardic irregular rhythm GI- soft, NT, ND, + BS Extremities- no clubbing, cyanosis, or edema  Wt Readings from Last 3 Encounters:  06/02/19 123.7 kg  05/25/19 123 kg  04/26/19 125.9 kg    EKG -afib at 123 bpm   Recent echo is also reviewed  Assessment and Plan:  1. Persistent  atrial fibrillation S/p afib ablation 04/25/19 Has had afib with RVR for several weeks Did have SR for a couple of weeks following the procedure Will plan for cardioversion  Continue Tikosyndiltaizem /metorpolol at current doses  No missed doses of anticoagulation  Bmet/cbc/covid ordered  2.OSA She reports compliance with CPAP  3. HTN Stable No change required today  F/u  here one week following cardioversion   Butch Penny C. Shavaun Osterloh, [facility] [address] [place],  [address] [phone]

## 2019-06-03 ENCOUNTER — Other Ambulatory Visit (HOSPITAL_COMMUNITY)
Admission: RE | Admit: 2019-06-03 | Discharge: 2019-06-03 | Disposition: A | Discharge status: Home / Self Care | Payer: Medicare Other | Source: Ambulatory Visit | Attending: Cardiology | Admitting: Cardiology

## 2019-06-03 DIAGNOSIS — Z01812 Encounter for preprocedural laboratory examination: Secondary | ICD-10-CM | POA: Diagnosis not present

## 2019-06-03 DIAGNOSIS — Z20828 Contact with and (suspected) exposure to other viral communicable diseases: Secondary | ICD-10-CM | POA: Insufficient documentation

## 2019-06-05 LAB — NOVEL CORONAVIRUS, NAA (HOSP ORDER, SEND-OUT TO REF LAB; TAT 18-24 HRS): SARS-CoV-2, NAA: NOT DETECTED

## 2019-06-06 NOTE — Progress Notes (Signed)
Spoke with patient who states that she has remained in quarantine and is not experiencing any s/s of COVID 19 since her screening. All questions answered appropriately. Linas Stepter, RN 

## 2019-06-07 ENCOUNTER — Ambulatory Visit (HOSPITAL_COMMUNITY)
Admission: RE | Admit: 2019-06-07 | Discharge: 2019-06-07 | Disposition: A | Discharge status: Home / Self Care | Payer: Medicare Other | Attending: Cardiology | Admitting: Cardiology

## 2019-06-07 ENCOUNTER — Encounter (HOSPITAL_COMMUNITY)
Admission: RE | Disposition: A | Discharge status: Home / Self Care | Payer: Self-pay | Source: Home / Self Care | Attending: Cardiology

## 2019-06-07 ENCOUNTER — Encounter (HOSPITAL_COMMUNITY): Payer: Self-pay | Admitting: Anesthesiology

## 2019-06-07 ENCOUNTER — Ambulatory Visit (HOSPITAL_COMMUNITY): Payer: Medicare Other | Admitting: Anesthesiology

## 2019-06-07 ENCOUNTER — Other Ambulatory Visit: Payer: Self-pay

## 2019-06-07 DIAGNOSIS — G4730 Sleep apnea, unspecified: Secondary | ICD-10-CM | POA: Diagnosis not present

## 2019-06-07 DIAGNOSIS — I4891 Unspecified atrial fibrillation: Secondary | ICD-10-CM | POA: Diagnosis not present

## 2019-06-07 DIAGNOSIS — I2510 Atherosclerotic heart disease of native coronary artery without angina pectoris: Secondary | ICD-10-CM | POA: Insufficient documentation

## 2019-06-07 DIAGNOSIS — Z6841 Body Mass Index (BMI) 40.0 and over, adult: Secondary | ICD-10-CM | POA: Insufficient documentation

## 2019-06-07 DIAGNOSIS — Z90710 Acquired absence of both cervix and uterus: Secondary | ICD-10-CM | POA: Diagnosis not present

## 2019-06-07 DIAGNOSIS — Z86718 Personal history of other venous thrombosis and embolism: Secondary | ICD-10-CM | POA: Insufficient documentation

## 2019-06-07 DIAGNOSIS — G4733 Obstructive sleep apnea (adult) (pediatric): Secondary | ICD-10-CM | POA: Diagnosis not present

## 2019-06-07 DIAGNOSIS — E782 Mixed hyperlipidemia: Secondary | ICD-10-CM | POA: Diagnosis not present

## 2019-06-07 DIAGNOSIS — Z79899 Other long term (current) drug therapy: Secondary | ICD-10-CM | POA: Diagnosis not present

## 2019-06-07 DIAGNOSIS — I4819 Other persistent atrial fibrillation: Secondary | ICD-10-CM | POA: Insufficient documentation

## 2019-06-07 DIAGNOSIS — Z7901 Long term (current) use of anticoagulants: Secondary | ICD-10-CM | POA: Insufficient documentation

## 2019-06-07 DIAGNOSIS — E669 Obesity, unspecified: Secondary | ICD-10-CM | POA: Insufficient documentation

## 2019-06-07 DIAGNOSIS — E785 Hyperlipidemia, unspecified: Secondary | ICD-10-CM | POA: Diagnosis not present

## 2019-06-07 DIAGNOSIS — M1990 Unspecified osteoarthritis, unspecified site: Secondary | ICD-10-CM | POA: Diagnosis not present

## 2019-06-07 DIAGNOSIS — I350 Nonrheumatic aortic (valve) stenosis: Secondary | ICD-10-CM | POA: Insufficient documentation

## 2019-06-07 DIAGNOSIS — I10 Essential (primary) hypertension: Secondary | ICD-10-CM | POA: Insufficient documentation

## 2019-06-07 HISTORY — PX: CARDIOVERSION: SHX1299

## 2019-06-07 SURGERY — CARDIOVERSION
Anesthesia: General

## 2019-06-07 MED ORDER — LIDOCAINE 2% (20 MG/ML) 5 ML SYRINGE
INTRAMUSCULAR | Status: DC | PRN
  Administered 2019-06-07: 40 mg via INTRAVENOUS

## 2019-06-07 MED ORDER — SODIUM CHLORIDE 0.9 % IV SOLN
INTRAVENOUS | Status: DC
  Administered 2019-06-07: 12:00:00 via INTRAVENOUS

## 2019-06-07 MED ORDER — PROPOFOL 10 MG/ML IV BOLUS
INTRAVENOUS | Status: DC | PRN
  Administered 2019-06-07: 60 mg via INTRAVENOUS

## 2019-06-07 NOTE — Anesthesia Preprocedure Evaluation (Addendum)
Anesthesia Evaluation  Patient identified by MRN, date of birth, ID band Patient awake    Reviewed: Allergy & Precautions, NPO status , Patient's Chart, lab work & pertinent test results  Airway Mallampati: III  TM Distance: >3 FB Neck ROM: Full    Dental no notable dental hx. (+) Teeth Intact, Dental Advisory Given   Pulmonary sleep apnea and Continuous Positive Airway Pressure Ventilation ,    Pulmonary exam normal breath sounds clear to auscultation       Cardiovascular hypertension, + CAD and + DVT  Normal cardiovascular exam+ dysrhythmias Atrial Fibrillation + Valvular Problems/Murmurs AS  Rhythm:Irregular Rate:Normal  TEE 2020  1. No evidence of a thrombus present in the left atrial appendage.  2. Mitral valve regurgitation is moderate by color flow Doppler.  3. The tricuspid valve was grossly normal. Tricuspid valve regurgitation is moderate.  4. The aortic valve is grossly normal Aortic valve regurgitation is trivial by color flow Doppler. No stenosis of the aortic valve  TTE 2020  3. The left ventricle has mildly reduced systolic function, with an ejection fraction of 45-50%. The cavity size was normal. Indeterminate diastolic function due to atrial fibrillation.  4. Mildly abnormal EF 45-50% but noted to be in atrial fibrillation with RVR during this study. Unable to exclude RWMA due to poor quality study.   Neuro/Psych negative neurological ROS  negative psych ROS   GI/Hepatic negative GI ROS, Neg liver ROS,   Endo/Other  Morbid obesity  Renal/GU negative Renal ROS  negative genitourinary   Musculoskeletal  (+) Arthritis , Osteoarthritis,    Abdominal   Peds  Hematology negative hematology ROS (+)   Anesthesia Other Findings   Reproductive/Obstetrics                            Anesthesia Physical Anesthesia Plan  ASA: III  Anesthesia Plan: General   Post-op Pain Management:     Induction: Intravenous  PONV Risk Score and Plan: 3 and Propofol infusion and Treatment may vary due to age or medical condition  Airway Management Planned: Natural Airway and Mask  Additional Equipment:   Intra-op Plan:   Post-operative Plan:   Informed Consent: I have reviewed the patients History and Physical, chart, labs and discussed the procedure including the risks, benefits and alternatives for the proposed anesthesia with the patient or authorized representative who has indicated his/her understanding and acceptance.     Dental advisory given  Plan Discussed with: CRNA  Anesthesia Plan Comments:         Anesthesia Quick Evaluation

## 2019-06-07 NOTE — Transfer of Care (Signed)
Immediate Anesthesia Transfer of Care Note  Patient: Tracy Miles  Procedure(s) Performed: CARDIOVERSION (N/A )  Patient Location: Endoscopy Unit  Anesthesia Type:MAC  Level of Consciousness: awake, alert  and oriented  Airway & Oxygen Therapy: Patient Spontanous Breathing and Patient connected to nasal cannula oxygen  Post-op Assessment: Report given to RN, Post -op Vital signs reviewed and stable and Patient moving all extremities X 4  Post vital signs: Reviewed and stable  Last Vitals:  Vitals Value Taken Time  BP    Temp    Pulse    Resp    SpO2      Last Pain:  Vitals:   06/07/19 1123  TempSrc: Oral  PainSc: 0-No pain         Complications: No apparent anesthesia complications

## 2019-06-07 NOTE — Discharge Instructions (Signed)
°  YOU HAD AN CARDIAC PROCEDURE TODAY: Refer to the procedure report and other information in the discharge instructions given to you for any specific questions about what was found during the examination. If this information does not answer your questions, please call [facility] office at [phone] to clarify.  ° °DIET: Your first meal following the procedure should be a light meal and then it is ok to progress to your normal diet. A half-sandwich or bowl of soup is an example of a good first meal. Heavy or fried foods are harder to digest and may make you feel nauseous or bloated. Drink plenty of fluids but you should avoid alcoholic beverages for 24 hours. If you had a esophageal dilation, please see attached instructions for diet.  ° °ACTIVITY: Your care partner should take you home directly after the procedure. You should plan to take it easy, moving slowly for the rest of the day. You can resume normal activity the day after the procedure however YOU SHOULD NOT DRIVE, use power tools, machinery or perform tasks that involve climbing or major physical exertion for 24 hours (because of the sedation medicines used during the test).  ° °SYMPTOMS TO REPORT IMMEDIATELY: °A cardiologist can be reached at any hour. Please call [phone] for any of the following symptoms:  °Vomiting of blood or coffee ground material  °New, significant abdominal pain  °New, significant chest pain or pain under the shoulder blades  °Painful or persistently difficult swallowing  °New shortness of breath  °Black, tarry-looking or red, bloody stools ° °FOLLOW UP:  °Please also call with any specific questions about appointments or follow up tests. ° ° °

## 2019-06-07 NOTE — CV Procedure (Signed)
   Cardioversion Note  Tracy Miles [id number] 23-Jul-1934  Procedure: DC Cardioversion Indications: atrial fibrillation  Procedure Details Consent: Obtained Time Out: Verified patient identification, verified procedure, site/side was marked, verified correct patient position, special equipment/implants available, Radiology Safety Procedures followed,  medications/allergies/relevent history reviewed, required imaging and test results available.  Performed  The patient has been on adequate anticoagulation.  The patient received IV propofol administered by anesthesia staff for sedation.  Synchronous cardioversion was performed at 120 joules.  The cardioversion was successful.  Complications: No apparent complications Patient did tolerate procedure well.  Tracy Dawley, MD, [facility] 06/07/2019, 12:04 PM

## 2019-06-07 NOTE — Interval H&P Note (Signed)
History and Physical Interval Note:  06/07/2019 11:22 AM  Tracy Miles  has presented today for surgery, with the diagnosis of AFIB.  The various methods of treatment have been discussed with the patient and family. After consideration of risks, benefits and other options for treatment, the patient has consented to  Procedure(s): CARDIOVERSION (N/A) as a surgical intervention.  The patient's history has been reviewed, patient examined, no change in status, stable for surgery.  I have reviewed the patient's chart and labs.  Questions were answered to the patient's satisfaction.     [name]

## 2019-06-08 ENCOUNTER — Encounter (HOSPITAL_COMMUNITY): Payer: Self-pay | Admitting: Cardiology

## 2019-06-08 NOTE — Anesthesia Postprocedure Evaluation (Signed)
Anesthesia Post Note  Patient: Tracy Miles  Procedure(s) Performed: CARDIOVERSION (N/A )     Patient location during evaluation: Endoscopy Anesthesia Type: General Level of consciousness: awake and alert Pain management: pain level controlled Vital Signs Assessment: post-procedure vital signs reviewed and stable Respiratory status: spontaneous breathing, nonlabored ventilation, respiratory function stable and patient connected to nasal cannula oxygen Cardiovascular status: blood pressure returned to baseline and stable Postop Assessment: no apparent nausea or vomiting Anesthetic complications: no    Last Vitals:  Vitals:   06/07/19 1200 06/07/19 1210  BP: 110/62 97/60  Pulse: 64 (!) 58  Resp: (!) 21 15  Temp:    SpO2: 95% 95%    Last Pain:  Vitals:   06/07/19 1152  TempSrc: Oral  PainSc: 0-No pain                 Maksym Pfiffner L Sabriel Borromeo

## 2019-06-15 ENCOUNTER — Other Ambulatory Visit: Payer: Self-pay

## 2019-06-15 ENCOUNTER — Ambulatory Visit (HOSPITAL_COMMUNITY)
Admission: RE | Admit: 2019-06-15 | Discharge: 2019-06-15 | Disposition: A | Discharge status: Home / Self Care | Payer: Medicare Other | Source: Ambulatory Visit | Attending: Nurse Practitioner | Admitting: Nurse Practitioner

## 2019-06-15 ENCOUNTER — Encounter (HOSPITAL_COMMUNITY): Payer: Self-pay | Admitting: Nurse Practitioner

## 2019-06-15 VITALS — BP 136/80 | HR 71 | Ht 63.0 in | Wt 269.4 lb

## 2019-06-15 DIAGNOSIS — I4819 Other persistent atrial fibrillation: Secondary | ICD-10-CM

## 2019-06-15 MED ORDER — METOPROLOL SUCCINATE ER 25 MG PO TB24: 37.5000 mg | ORAL_TABLET | Freq: Two times a day (BID) | ORAL | 2 refills | Status: DC

## 2019-06-15 NOTE — Progress Notes (Signed)
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 female who presents today for  electrophysiology follow up afib ablation 6 weeks ago. She had developed medicine refractory atrial fibrillation. She had  hematoma of the abdomen and both groins that gradually cleared. She continued  on tikosyn.  She has had intermittent afib since the procedure and was in afib  at one month f/u. She continued to watch her heart rates for the last couple of weeks and is consistently out of rhythm. She has been able to return to exercising in the pool and this has made her feel better. Her v rates are quite elevated in the 120's. Higher does of BB in the past have not helped and increased dose of diltiazem increases her swelling.  No swallowing issues since the procedure.   F/u in afib clinic,10/15, she is s/p successful cardioversion and remains in SR. She is very happy about this and is feeling much improved.   Today, she denies symptoms of palpitations, chest pain,   lower extremity edema, dizziness, presyncope, or syncope.  The patient is otherwise without complaint today.   Past Medical History:  Diagnosis Date  . Anemia   . Aortic stenosis    Very mild  . Benign paroxysmal positional vertigo 12/17/2013  . Chicken pox as a child  . Colon polyp   . Coronary artery disease   . DDD (degenerative disc disease) 11/16/2013  . Hyperlipidemia   . Hypertension   . Iron deficiency anemia 11/13/2013  . Mumps child and teenager  . OA (osteoarthritis) 11/19/2013   S/p L TKR  . Obesity   . OSA on CPAP   . Pancreatitis    post hysterectomy  . Persistent atrial fibrillation ([place])   . Personal history of colonic polyps 02/25/2014  . Personal history of DVT (deep vein thrombosis) X 2   "left"  . Sleep apnea   . Spinal stenosis    Past Surgical History:  Procedure Laterality Date  . APPENDECTOMY    . ATRIAL FIBRILLATION ABLATION N/A 04/25/2019   Procedure: ATRIAL FIBRILLATION  ABLATION;  Surgeon: [name], MD;  Location: [place] [facility];  Service: Cardiovascular;  Laterality: N/A;  . CARDIOVERSION N/A 08/09/2015   Procedure: CARDIOVERSION;  Surgeon: Jerline Pain, MD;  Location: [place];  Service: Cardiovascular;  Laterality: N/A;  . CARDIOVERSION N/A 09/30/2018   Procedure: CARDIOVERSION;  Surgeon: [name], MD;  Location: [facility] ENDOSCOPY;  Service: Cardiovascular;  Laterality: N/A;  . CARDIOVERSION N/A 02/27/2019   Procedure: CARDIOVERSION;  Surgeon: Buford Dresser, MD;  Location: [facility] ENDOSCOPY;  Service: Cardiovascular;  Laterality: N/A;  . CARDIOVERSION N/A 06/07/2019   Procedure: CARDIOVERSION;  Surgeon: [name], MD;  Location: [place];  Service: Cardiovascular;  Laterality: N/A;  . CATARACT EXTRACTION W/ INTRAOCULAR LENS  IMPLANT, BILATERAL Bilateral 2016  . CYST REMOVAL HAND Bilateral 1990's   "played to much golf"  . DILATION AND CURETTAGE OF UTERUS    . LUMBAR LAMINECTOMY  40 yrs ago   "L3-4"  . MENISCUS REPAIR Bilateral 2000 and 2010  . REPLACEMENT TOTAL KNEE Left 2013  . TEE WITHOUT CARDIOVERSION N/A 04/24/2019   Procedure: TRANSESOPHAGEAL ECHOCARDIOGRAM (TEE);  Surgeon: Acie Fredrickson Wonda Cheng, MD;  Location: [place];  Service: Cardiovascular;  Laterality: N/A;  . TONSILECTOMY, ADENOIDECTOMY, BILATERAL MYRINGOTOMY AND TUBES  child  . TOTAL ABDOMINAL HYSTERECTOMY  1995   had 2 tumors- benign  . TUBAL LIGATION  85 years old    ROS- all systems are reviewed and negatives except as per HPI above  Current Outpatient Medications  Medication Sig Dispense Refill  . acetaminophen (TYLENOL) 325 MG tablet Take 325 mg by mouth daily.     Marland Kitchen apixaban (ELIQUIS) 5 MG TABS tablet TAKE ONE (1) TABLET BY MOUTH TWO (2) TIMES DAILY (Patient taking differently: Take 5 mg by mouth 2 (two) times daily. ) 180 tablet 1  . atorvastatin (LIPITOR) 20 MG tablet TAKE 1 TABLET BY MOUTH  DAILY AT 6 PM. (Patient taking differently: Take 20 mg  by mouth every evening. ) 90 tablet 2  . bumetanide (BUMEX) 1 MG tablet TAKE ONE (1) TABLET BY MOUTH EVERY DAY (Patient taking differently: Take 1 mg by mouth daily. ) 90 tablet 1  . Calcium Carb-Cholecalciferol (CALCIUM 600 + D PO) Take 1 tablet by mouth 2 (two) times daily.    . Carboxymethylcellul-Glycerin (LUBRICATING EYE DROPS OP) Place 1 drop into both eyes daily as needed (dry eyes).    Marland Kitchen diltiazem (CARDIZEM CD) 120 MG 24 hr capsule Take 1 capsule (120 mg total) by mouth daily. 60 capsule 6  . dofetilide (TIKOSYN) 250 MCG capsule Take 250 mcg by mouth 2 (two) times daily.    Marland Kitchen lisinopril (ZESTRIL) 10 MG tablet Take 1 tablet (10 mg total) by mouth daily. 90 tablet 1  . loratadine (CLARITIN) 10 MG tablet Take 10 mg by mouth daily.     . metoprolol succinate (TOPROL XL) 25 MG 24 hr tablet Take 1.5 tablets (37.5 mg total) by mouth 2 (two) times daily. 270 tablet 2  . potassium chloride (K-DUR) 10 MEQ tablet TAKE 1 TABLET BY MOUTH  EVERY MORNING AND 2 TABLETS BY MOUTH IN THE EVENING (Patient taking differently: Take 10-20 mEq by mouth See admin instructions. Take 10 meq in the morning and 20 meq in the evening) 270 tablet 2  . Probiotic CAPS Take 1 capsule by mouth daily.    . psyllium (REGULOID) 0.52 g capsule Take 0.52 g by mouth 2 (two) times daily.      No current facility-administered medications for this encounter.     Physical Exam: Vitals:   06/15/19 1356  BP: 136/80  Pulse: 71  Weight: 122.2 kg  Height: 5\' 3"  (1.6 m)    GEN- The patient is well appearing, alert and oriented x 3 today.   Head- normocephalic, atraumatic Eyes-  Sclera clear, conjunctiva pink Ears- hearing intact Oropharynx- clear Lungs- Clear to ausculation bilaterally, normal work of breathing Heart- tachycardic irregular rhythm GI- soft, NT, ND, + BS Extremities- no clubbing, cyanosis, or edema  Wt Readings from Last 3 Encounters:  06/15/19 122.2 kg  06/07/19 123.4 kg  06/02/19 123.7 kg    EKG -afib  at 123 bpm   Recent echo is also reviewed  Assessment and Plan:  1. Persistent atrial fibrillation S/p afib ablation 04/25/19 Had afib with RVR for several weeks Did have SR for a couple of weeks following the procedure S/p successful cardioversion and remains in SR  Continue Tikosyn/diltaizem /metorpolol at current doses  No missed doses of anticoagulation with CHA2DS2VASc score of at least 4  2.OSA She reports compliance with CPAP  3. HTN Stable No change required today  F/u  with Dr. Rayann Heman 11/30   Geroge Baseman. Elisandra Deshmukh, [facility] [address] [place], [place] [address] [phone]

## 2019-06-16 ENCOUNTER — Other Ambulatory Visit: Payer: Self-pay

## 2019-06-16 ENCOUNTER — Ambulatory Visit (INDEPENDENT_AMBULATORY_CARE_PROVIDER_SITE_OTHER): Payer: Medicare Other

## 2019-06-16 DIAGNOSIS — Z23 Encounter for immunization: Secondary | ICD-10-CM

## 2019-06-19 ENCOUNTER — Telehealth (HOSPITAL_COMMUNITY): Payer: Self-pay | Admitting: *Deleted

## 2019-06-19 NOTE — Telephone Encounter (Signed)
Patient called in stating she went back into afib this weekend. Discussed with [name] NP - will refer back to Dr. Rayann Heman to further discuss ERAF post DCCV.

## 2019-06-20 ENCOUNTER — Telehealth: Payer: Self-pay

## 2019-06-21 ENCOUNTER — Encounter: Payer: Self-pay | Admitting: Internal Medicine

## 2019-06-21 ENCOUNTER — Telehealth (INDEPENDENT_AMBULATORY_CARE_PROVIDER_SITE_OTHER): Payer: Medicare Other | Admitting: Internal Medicine

## 2019-06-21 VITALS — BP 142/93 | HR 129 | Ht 63.0 in | Wt 268.0 lb

## 2019-06-21 DIAGNOSIS — G4733 Obstructive sleep apnea (adult) (pediatric): Secondary | ICD-10-CM | POA: Diagnosis not present

## 2019-06-21 DIAGNOSIS — I10 Essential (primary) hypertension: Secondary | ICD-10-CM | POA: Diagnosis not present

## 2019-06-21 DIAGNOSIS — I4819 Other persistent atrial fibrillation: Secondary | ICD-10-CM | POA: Diagnosis not present

## 2019-06-21 NOTE — Progress Notes (Signed)
Electrophysiology TeleHealth Note   Due to national recommendations of social distancing due to [place] 19, an audio telehealth visit is felt to be most appropriate for this patient at this time.  Verbal consent was obtained by me for the telehealth visit today.  The patient does not have capability for a virtual visit.  A phone visit is therefore required today.   Date:  06/21/2019   ID:  Tracy Miles, DOB [birth year], MRN [id number]  Location: patient's home  Provider location:  [facility]  Evaluation Performed: Follow-up visit  PCP:  [name], MD   Electrophysiologist:  Dr Rayann Heman  Chief Complaint:  AF follow up  History of Present Illness:    Tracy Miles is a 85 y.o. female who presents via telehealth conferencing today.  Since last being seen in our clinic, the patient reports doing relatively well.  When in [place], she is symptomatically improved. Since reverting back to AF, she has had increased fatigue and exercise intolerance.  Today, she denies symptoms of palpitations, chest pain, shortness of breath,  lower extremity edema, dizziness, presyncope, or syncope.  The patient is otherwise without complaint today.  The patient denies symptoms of fevers, chills, cough, or new SOB worrisome for COVID 19.  Past Medical History:  Diagnosis Date  . Anemia   . Aortic stenosis    Very mild  . Benign paroxysmal positional vertigo 12/17/2013  . Chicken pox as a child  . Colon polyp   . Coronary artery disease   . DDD (degenerative disc disease) 11/16/2013  . Hyperlipidemia   . Hypertension   . Iron deficiency anemia 11/13/2013  . Mumps child and teenager  . OA (osteoarthritis) 11/19/2013   S/p L TKR  . Obesity   . OSA on CPAP   . Pancreatitis    post hysterectomy  . Persistent atrial fibrillation ([place])   . Personal history of colonic polyps 02/25/2014  . Personal history of DVT (deep vein thrombosis) X 2   "left"  . Sleep apnea   . Spinal stenosis     Past  Surgical History:  Procedure Laterality Date  . APPENDECTOMY    . ATRIAL FIBRILLATION ABLATION N/A 04/25/2019   Procedure: ATRIAL FIBRILLATION ABLATION;  Surgeon: [name], MD;  Location: [place] [facility];  Service: Cardiovascular;  Laterality: N/A;  . CARDIOVERSION N/A 08/09/2015   Procedure: CARDIOVERSION;  Surgeon: Jerline Pain, MD;  Location: [place];  Service: Cardiovascular;  Laterality: N/A;  . CARDIOVERSION N/A 09/30/2018   Procedure: CARDIOVERSION;  Surgeon: [name], MD;  Location: [facility] ENDOSCOPY;  Service: Cardiovascular;  Laterality: N/A;  . CARDIOVERSION N/A 02/27/2019   Procedure: CARDIOVERSION;  Surgeon: Buford Dresser, MD;  Location: [facility] ENDOSCOPY;  Service: Cardiovascular;  Laterality: N/A;  . CARDIOVERSION N/A 06/07/2019   Procedure: CARDIOVERSION;  Surgeon: [name], MD;  Location: [place];  Service: Cardiovascular;  Laterality: N/A;  . CATARACT EXTRACTION W/ INTRAOCULAR LENS  IMPLANT, BILATERAL Bilateral 2016  . CYST REMOVAL HAND Bilateral 1990's   "played to much golf"  . DILATION AND CURETTAGE OF UTERUS    . LUMBAR LAMINECTOMY  40 yrs ago   "L3-4"  . MENISCUS REPAIR Bilateral 2000 and 2010  . REPLACEMENT TOTAL KNEE Left 2013  . TEE WITHOUT CARDIOVERSION N/A 04/24/2019   Procedure: TRANSESOPHAGEAL ECHOCARDIOGRAM (TEE);  Surgeon: Acie Fredrickson Wonda Cheng, MD;  Location: [place];  Service: Cardiovascular;  Laterality: N/A;  . TONSILECTOMY, ADENOIDECTOMY, BILATERAL MYRINGOTOMY AND TUBES  child  .  TOTAL ABDOMINAL HYSTERECTOMY  1995   had 2 tumors- benign  . TUBAL LIGATION  85 years old    Current Outpatient Medications  Medication Sig Dispense Refill  . acetaminophen (TYLENOL) 325 MG tablet Take 325 mg by mouth daily.     Marland Kitchen apixaban (ELIQUIS) 5 MG TABS tablet TAKE ONE (1) TABLET BY MOUTH TWO (2) TIMES DAILY (Patient taking differently: Take 5 mg by mouth 2 (two) times daily. ) 180 tablet 1  . atorvastatin (LIPITOR) 20 MG tablet TAKE  1 TABLET BY MOUTH  DAILY AT 6 PM. (Patient taking differently: Take 20 mg by mouth every evening. ) 90 tablet 2  . bumetanide (BUMEX) 1 MG tablet TAKE ONE (1) TABLET BY MOUTH EVERY DAY (Patient taking differently: Take 1 mg by mouth daily. ) 90 tablet 1  . Calcium Carb-Cholecalciferol (CALCIUM 600 + D PO) Take 1 tablet by mouth 2 (two) times daily.    . Carboxymethylcellul-Glycerin (LUBRICATING EYE DROPS OP) Place 1 drop into both eyes daily as needed (dry eyes).    Marland Kitchen diltiazem (CARDIZEM CD) 120 MG 24 hr capsule Take 1 capsule (120 mg total) by mouth daily. 60 capsule 6  . dofetilide (TIKOSYN) 250 MCG capsule Take 250 mcg by mouth 2 (two) times daily.    Marland Kitchen lisinopril (ZESTRIL) 10 MG tablet Take 1 tablet (10 mg total) by mouth daily. 90 tablet 1  . loratadine (CLARITIN) 10 MG tablet Take 10 mg by mouth daily.     . metoprolol succinate (TOPROL XL) 25 MG 24 hr tablet Take 1.5 tablets (37.5 mg total) by mouth 2 (two) times daily. 270 tablet 2  . potassium chloride (K-DUR) 10 MEQ tablet TAKE 1 TABLET BY MOUTH  EVERY MORNING AND 2 TABLETS BY MOUTH IN THE EVENING (Patient taking differently: Take 10-20 mEq by mouth See admin instructions. Take 10 meq in the morning and 20 meq in the evening) 270 tablet 2  . Probiotic CAPS Take 1 capsule by mouth daily.    . psyllium (REGULOID) 0.52 g capsule Take 0.52 g by mouth 2 (two) times daily.      No current facility-administered medications for this visit.     Allergies:   Gabapentin, Lactose intolerance (gi), Zebeta [bisoprolol fumarate], Penicillins, and Sulfa antibiotics   Social History:  The patient  reports that she has never smoked. She has never used smokeless tobacco. She reports that she does not drink alcohol or use drugs.   Family History:  The patient's  family history includes Atrial fibrillation in her son; COPD in her father; Cancer in her maternal grandfather and paternal grandmother; Cancer (age of onset: 61) in her son; Cancer (age of onset:  81) in her father; Dementia in her mother; Diabetes in her maternal grandmother; Gout in her son and son; Heart attack (age of onset: 11) in her mother; Heart disease in her brother; Hyperlipidemia in her brother, mother, son, and son; Hypertension in her brother; Pernicious anemia in her maternal grandmother and mother; Sleep apnea in her son; Stroke in her paternal grandfather.   ROS:  Please see the history of present illness.   All other systems are personally reviewed and negative.    Exam:    Vital Signs:  BP (!) 142/93   Pulse (!) 129   Ht 5\' 3"  (1.6 m)   Wt 268 lb (121.6 kg)   SpO2 97%   BMI 47.47 kg/m   Well sounding and appearing, alert and conversant, regular work of breathing  Labs/Other Tests and Data Reviewed:    Recent Labs: 04/20/2019: ALT 11; TSH 2.630 06/02/2019: BUN 17; Creatinine, Ser 1.07; Hemoglobin 12.6; Magnesium 2.0; Platelets 260; Potassium 4.0; Sodium 138   Wt Readings from Last 3 Encounters:  06/21/19 268 lb (121.6 kg)  06/15/19 269 lb 6.4 oz (122.2 kg)  06/07/19 272 lb (123.4 kg)      ASSESSMENT & PLAN:    1.  Persistent atrial fibrillation She has had ERAF post ablation despite Tikosyn  Continue Eliquis for CHADS2VASC of 4 She is not felt to be a candidate for amiodarone with lung disease (fibrosis by CT) Will continue Tikosyn for now, wait until the end of the 3 month post ablation period and reassess at that time Pt has LA enlargement and we have discussed previously that her chances of maintaining SR are decreased.  Follow up in 4 weeks. If still in AF at that time, will plan repeat DCCV.   2. OSA Compliance with CPAP encouraged  3.  HTN Stable No change required today   Follow-up:  With me in 4 weeks   Patient Risk:  after full review of this patients clinical status, I feel that they are at moderate risk at this time.  Today, I have spent 15 minutes with the patient with telehealth technology discussing arrhythmia management .     Army Fossa, MD  06/21/2019 10:26 AM     [facility] [address] [place] [address] [phone] (office) [phone] (fax)

## 2019-06-28 ENCOUNTER — Other Ambulatory Visit: Payer: Self-pay

## 2019-06-28 MED ORDER — DOFETILIDE 250 MCG PO CAPS: 250.0000 ug | ORAL_CAPSULE | Freq: Two times a day (BID) | ORAL | 1 refills | Status: DC

## 2019-07-07 ENCOUNTER — Other Ambulatory Visit (HOSPITAL_BASED_OUTPATIENT_CLINIC_OR_DEPARTMENT_OTHER): Payer: Self-pay | Admitting: Family Medicine

## 2019-07-07 DIAGNOSIS — Z1231 Encounter for screening mammogram for malignant neoplasm of breast: Secondary | ICD-10-CM

## 2019-07-17 ENCOUNTER — Telehealth (INDEPENDENT_AMBULATORY_CARE_PROVIDER_SITE_OTHER): Payer: Medicare Other | Admitting: Internal Medicine

## 2019-07-17 ENCOUNTER — Other Ambulatory Visit: Payer: Self-pay | Admitting: Family Medicine

## 2019-07-17 ENCOUNTER — Encounter: Payer: Self-pay | Admitting: Internal Medicine

## 2019-07-17 VITALS — BP 157/90 | HR 71 | Ht 63.0 in | Wt 269.0 lb

## 2019-07-17 DIAGNOSIS — G4733 Obstructive sleep apnea (adult) (pediatric): Secondary | ICD-10-CM | POA: Diagnosis not present

## 2019-07-17 DIAGNOSIS — I4819 Other persistent atrial fibrillation: Secondary | ICD-10-CM | POA: Diagnosis not present

## 2019-07-17 DIAGNOSIS — I10 Essential (primary) hypertension: Secondary | ICD-10-CM | POA: Diagnosis not present

## 2019-07-17 NOTE — Progress Notes (Signed)
Electrophysiology TeleHealth Note   Due to national recommendations of social distancing due to COVID 19, an audio/video telehealth visit is felt to be most appropriate for this patient at this time.  See MyChart message from today for the patient's consent to telehealth for [facility].  Date:  07/17/2019   ID:  Tracy Miles, DOB 10-21-1933, MRN [id number]  Location: patient's home  Provider location:  [facility]  Evaluation Performed: Follow-up visit  PCP:  [name], MD   Electrophysiologist:  Dr Rayann Heman  Chief Complaint:  palpitations  History of Present Illness:    Tracy Miles is a 85 y.o. female who presents via telehealth conferencing today.  Since last being seen in our clinic, the patient reports doing very well.  She has had a bit of ERAF but feels that this has recently resolved.  She feels better now.  Today, she denies symptoms of palpitations, chest pain, shortness of breath,  lower extremity edema, dizziness, presyncope, or syncope.  The patient is otherwise without complaint today.  The patient denies symptoms of fevers, chills, cough, or new SOB worrisome for COVID 19.  Past Medical History:  Diagnosis Date   Anemia    Aortic stenosis    Very mild   Benign paroxysmal positional vertigo 12/17/2013   Chicken pox as a child   Colon polyp    Coronary artery disease    DDD (degenerative disc disease) 11/16/2013   Hyperlipidemia    Hypertension    Iron deficiency anemia 11/13/2013   Mumps child and teenager   OA (osteoarthritis) 11/19/2013   S/p L TKR   Obesity    OSA on CPAP    Pancreatitis    post hysterectomy   Persistent atrial fibrillation (HCC)    Personal history of colonic polyps 02/25/2014   Personal history of DVT (deep vein thrombosis) X 2   "left"   Sleep apnea    Spinal stenosis     Past Surgical History:  Procedure Laterality Date   APPENDECTOMY     ATRIAL FIBRILLATION ABLATION N/A 04/25/2019   Procedure: ATRIAL FIBRILLATION ABLATION;  Surgeon: [name], MD;  Location: [place] [facility];  Service: Cardiovascular;  Laterality: N/A;   CARDIOVERSION N/A 08/09/2015   Procedure: CARDIOVERSION;  Surgeon: Jerline Pain, MD;  Location: [place];  Service: Cardiovascular;  Laterality: N/A;   CARDIOVERSION N/A 09/30/2018   Procedure: CARDIOVERSION;  Surgeon: [name], MD;  Location: [facility] ENDOSCOPY;  Service: Cardiovascular;  Laterality: N/A;   CARDIOVERSION N/A 02/27/2019   Procedure: CARDIOVERSION;  Surgeon: Buford Dresser, MD;  Location: [facility] ENDOSCOPY;  Service: Cardiovascular;  Laterality: N/A;   CARDIOVERSION N/A 06/07/2019   Procedure: CARDIOVERSION;  Surgeon: [name], MD;  Location: [facility] ENDOSCOPY;  Service: Cardiovascular;  Laterality: N/A;   CATARACT EXTRACTION W/ INTRAOCULAR LENS  IMPLANT, BILATERAL Bilateral 2016   CYST REMOVAL HAND Bilateral 1990's   "played to much golf"   White  40 yrs ago   "L3-4"   MENISCUS REPAIR Bilateral 2000 and 2010   REPLACEMENT TOTAL KNEE Left 2013   TEE WITHOUT CARDIOVERSION N/A 04/24/2019   Procedure: TRANSESOPHAGEAL ECHOCARDIOGRAM (TEE);  Surgeon: Acie Fredrickson Wonda Cheng, MD;  Location: [facility] ENDOSCOPY;  Service: Cardiovascular;  Laterality: N/A;   TONSILECTOMY, ADENOIDECTOMY, BILATERAL MYRINGOTOMY AND TUBES  child   [place]   had 2 tumors- benign   TUBAL LIGATION  85 years old  Current Outpatient Medications  Medication Sig Dispense Refill   acetaminophen (TYLENOL) 325 MG tablet Take 325 mg by mouth daily.      apixaban (ELIQUIS) 5 MG TABS tablet TAKE ONE (1) TABLET BY MOUTH TWO (2) TIMES DAILY (Patient taking differently: Take 5 mg by mouth 2 (two) times daily. ) 180 tablet 1   atorvastatin (LIPITOR) 20 MG tablet TAKE 1 TABLET BY MOUTH  DAILY AT 6 PM. (Patient taking differently: Take 20 mg by mouth every evening. ) 90 tablet 2    bumetanide (BUMEX) 1 MG tablet TAKE ONE (1) TABLET BY MOUTH EVERY DAY (Patient taking differently: Take 1 mg by mouth daily. ) 90 tablet 1   Calcium Carb-Cholecalciferol (CALCIUM 600 + D PO) Take 1 tablet by mouth 2 (two) times daily.     Carboxymethylcellul-Glycerin (LUBRICATING EYE DROPS OP) Place 1 drop into both eyes daily as needed (dry eyes).     diltiazem (CARDIZEM CD) 120 MG 24 hr capsule Take 1 capsule (120 mg total) by mouth daily. 60 capsule 6   dofetilide (TIKOSYN) 250 MCG capsule Take 1 capsule (250 mcg total) by mouth 2 (two) times daily. 60 capsule 1   lisinopril (ZESTRIL) 10 MG tablet Take 1 tablet (10 mg total) by mouth daily. 90 tablet 1   loratadine (CLARITIN) 10 MG tablet Take 10 mg by mouth daily.      metoprolol succinate (TOPROL XL) 25 MG 24 hr tablet Take 1.5 tablets (37.5 mg total) by mouth 2 (two) times daily. 270 tablet 2   potassium chloride (K-DUR) 10 MEQ tablet TAKE 1 TABLET BY MOUTH  EVERY MORNING AND 2 TABLETS BY MOUTH IN THE EVENING (Patient taking differently: Take 10-20 mEq by mouth See admin instructions. Take 10 meq in the morning and 20 meq in the evening) 270 tablet 2   Probiotic CAPS Take 1 capsule by mouth daily.     psyllium (REGULOID) 0.52 g capsule Take 0.52 g by mouth 2 (two) times daily.      No current facility-administered medications for this visit.     Allergies:   Gabapentin, Lactose intolerance (gi), Zebeta [bisoprolol fumarate], Penicillins, and Sulfa antibiotics   Social History:  The patient  reports that she has never smoked. She has never used smokeless tobacco. She reports that she does not drink alcohol or use drugs.   Family History:  The patient's family history includes Atrial fibrillation in her son; COPD in her father; Cancer in her maternal grandfather and paternal grandmother; Cancer (age of onset: 74) in her son; Cancer (age of onset: 83) in her father; Dementia in her mother; Diabetes in her maternal grandmother; Gout in  her son and son; Heart attack (age of onset: 7) in her mother; Heart disease in her brother; Hyperlipidemia in her brother, mother, son, and son; Hypertension in her brother; Pernicious anemia in her maternal grandmother and mother; Sleep apnea in her son; Stroke in her paternal grandfather.   ROS:  Please see the history of present illness.   All other systems are personally reviewed and negative.    Exam:    Vital Signs:  BP (!) 157/90    Pulse 71    Ht 5\' 3"  (1.6 m)    Wt 269 lb (122 kg)    SpO2 95%    BMI 47.65 kg/m   Well sounding and appearing, alert and conversant, regular work of breathing,  good skin color Eyes- anicteric, neuro- grossly intact, skin- no apparent rash or lesions or  cyanosis, mouth- oral mucosa is pink  Labs/Other Tests and Data Reviewed:    Recent Labs: 04/20/2019: ALT 11; TSH 2.630 06/02/2019: BUN 17; Creatinine, Ser 1.07; Hemoglobin 12.6; Magnesium 2.0; Platelets 260; Potassium 4.0; Sodium 138   Wt Readings from Last 3 Encounters:  07/17/19 269 lb (122 kg)  06/21/19 268 lb (121.6 kg)  06/15/19 269 lb 6.4 oz (122.2 kg)     ASSESSMENT & PLAN:    1.  Persistent atrial fibrillation Continue tikosyn She is pleased with current state chads2vasc score is 4.  She is on eliquis She is not a candidate for amiodarone due to pulmonary fibotic changes on CT  2. OSA Using CPAP  3. Obesity Lifestyle modification is encouraged  4. HTN Stable No change required today  Follow-up:  3 months with me   Patient Risk:  after full review of this patients clinical status, I feel that they are at moderate risk at this time.  Today, I have spent 15 minutes with the patient with telehealth technology discussing arrhythmia management .    Army Fossa, MD  07/17/2019 11:02 AM     [facility] [address] [place] [address] [phone] (office) [phone] (fax)

## 2019-07-20 ENCOUNTER — Ambulatory Visit (HOSPITAL_BASED_OUTPATIENT_CLINIC_OR_DEPARTMENT_OTHER)
Admission: RE | Admit: 2019-07-20 | Discharge: 2019-07-20 | Disposition: A | Discharge status: Home / Self Care | Payer: Medicare Other | Source: Ambulatory Visit | Attending: Family Medicine | Admitting: Family Medicine

## 2019-07-20 ENCOUNTER — Other Ambulatory Visit: Payer: Self-pay

## 2019-07-20 DIAGNOSIS — Z1231 Encounter for screening mammogram for malignant neoplasm of breast: Secondary | ICD-10-CM | POA: Insufficient documentation

## 2019-07-31 ENCOUNTER — Telehealth: Payer: Medicare Other | Admitting: Internal Medicine

## 2019-08-21 ENCOUNTER — Other Ambulatory Visit: Payer: Self-pay

## 2019-08-21 MED ORDER — DOFETILIDE 250 MCG PO CAPS: 250.0000 ug | ORAL_CAPSULE | Freq: Two times a day (BID) | ORAL | 8 refills | Status: DC

## 2019-09-15 DIAGNOSIS — Z23 Encounter for immunization: Secondary | ICD-10-CM | POA: Diagnosis not present

## 2019-09-20 ENCOUNTER — Encounter: Payer: Self-pay | Admitting: Adult Health

## 2019-09-20 ENCOUNTER — Ambulatory Visit (INDEPENDENT_AMBULATORY_CARE_PROVIDER_SITE_OTHER): Payer: Medicare Other | Admitting: Adult Health

## 2019-09-20 ENCOUNTER — Other Ambulatory Visit: Payer: Self-pay

## 2019-09-20 DIAGNOSIS — G4733 Obstructive sleep apnea (adult) (pediatric): Secondary | ICD-10-CM | POA: Diagnosis not present

## 2019-09-20 DIAGNOSIS — J8410 Pulmonary fibrosis, unspecified: Secondary | ICD-10-CM | POA: Diagnosis not present

## 2019-09-20 NOTE — Assessment & Plan Note (Addendum)
Very mild subpleural fibrosis noted.  She does have previous amiodarone use in 2011.  She has very little symptoms.  Autoimmune/CTD labs unrevealing.  We will recheck spirometry with DLCO in July for stability. Continue to follow Would avoid use of amiodarone in the future if able

## 2019-09-20 NOTE — Progress Notes (Signed)
@Patient  ID: Tracy Miles, female    DOB: 03-09-1934, 86 y.o.   MRN: [id number]  Chief Complaint  Patient presents with  . Follow-up    OSA     Referring provider: [name], MD  HPI: 86 year old female never smoker followed for obstructive sleep apnea on BiPAP at bedtime, pulmonary fibrosis,  Medical history significant for persistent A. Fib recurrent after cardioversion-, history of recurrent DVT on Eliquis Amiodarone use ~2011 (~6 months usage)  Lives at [facility] home     TEST/EVENTS :  PSG 03/07/15 >had trouble sleeping due to back pain (total sleep time was only 243 min ) AHI 10.4./hr (AHI during REM 74.7 /hr ) Low sat 84%. Severe PLMS.  August 2020 pan ANCA panel negative, ANA negative, rheumatoid factor negative  High-resolution CT chest March 31, 2019 mild basilar predominant subpleural fibrosis can be seen with amiodarone use-nonspecific interstitial pneumonitis or early mild usual interstitial pneumonitis due to other etiologies.    ILD Workup  From [place] , lived in [place] for 9 yrs  Education officer, museum, homemaker.  Second hand smoke as child  Frequent bronchitis as child and adult. (frequently long time to get over) . No official dx of Asthma .  Mild rhinitis  Previous Cat . (no bird/chickens)  No hot tub.  No recent travel. (previous international travel -nothing in last 10 yrs. ) No known autoimmune disease  No long term macrodantin. No chemo use. No methotrexate use.  On Amiodarone in past for ~ 6 months , taken off 2014 due to eye issues.  No unusual hobbies . Occasional refinish furniture  Lives alone, does light house work .  Goes to indoor pool , walks in pool .  No dysphagia or choking or GERD .   ANA [name] neg .   09/20/2019 Follow up : OSA , Pulmonary Fibrosis  Patient returns for a 69-month follow-up.  Patient has known underlying obstructive sleep apnea.  She is on nocturnal BiPAP.  Says she is doing very well.  Says she  cannot sleep without her BiPAP.  She wears it every night.  She says she feels rested and feels that she benefits from her BiPAP.  Patient download shows excellent compliance with 100% usage.  Daily average usage at 8 hours.  Patient is on IPAP 16, EPAP 12 cm H2O.  AHI 0.9.  Patient is followed by cardiology for persistent A. fib.  Is currently on Tikosyn and Eliquis.  She underwent cardioversion which was initially successful however her A. fib with RVR did return.  Patient had previously been on amiodarone and 2011.  Cardiology was considering reinitiating this.  Pulmonary function testing showed normal lung function with no airflow obstruction or restriction.  DLCO was 109%.  Patient did have some ongoing shortness of breath.  A high-resolution CT chest was done on July 31 that showed mild basilar subpleural reticulation and groundglass, mild traction bronchiectasis.  There was no honeycombing.  Consistent with a nonspecific interstitial pneumonitis or early UIP.  Patient underwent an ablation.  She says that she had 2 months of no episodes of A. fib.  She had no shortness of breath and says she felt the best that she had felt in a long time.  She says recently her A. fib has come back and has been intermittent but more controllable.  She has no significant shortness of breath currently.  Patient denies any cough.  Autoimmune labs with ANA, RA factor and ANCA panel were  negative.  Patient says she has had the Moderna vaccine 09/13/19, to receive second dose 10/07/19. Did well    Allergies  Allergen Reactions  . Gabapentin Swelling  . Lactose Intolerance (Gi) Other (See Comments)    Bothers her stomach  . Zebeta [Bisoprolol Fumarate] Nausea Only  . Penicillins Hives and Rash    Did it involve swelling of the face/tongue/throat, SOB, or low BP? No Did it involve sudden or severe rash/hives, skin peeling, or any reaction on the inside of your mouth or nose? Yes Did you need to seek medical attention at  a hospital or doctor's office? No When did it last happen? 25+ years If all above answers are "NO", may proceed with cephalosporin use.   . Sulfa Antibiotics Rash    Immunization History  Administered Date(s) Administered  . Fluad Quad(high Dose 65+) 06/16/2019  . Influenza, High Dose Seasonal PF 05/11/2017, 05/24/2018  . Influenza,inj,Quad PF,6+ Mos 04/30/2016  . Influenza-Unspecified 05/31/2013, 05/01/2014, 06/11/2015  . Pneumococcal Conjugate-13 04/02/2014  . Pneumococcal Polysaccharide-23 08/31/1998, 09/06/2015  . Tdap 07/31/2012    Past Medical History:  Diagnosis Date  . Anemia   . Aortic stenosis    Very mild  . Benign paroxysmal positional vertigo 12/17/2013  . Chicken pox as a child  . Colon polyp   . Coronary artery disease   . DDD (degenerative disc disease) 11/16/2013  . Hyperlipidemia   . Hypertension   . Iron deficiency anemia 11/13/2013  . Mumps child and teenager  . OA (osteoarthritis) 11/19/2013   S/p L TKR  . Obesity   . OSA on CPAP   . Pancreatitis    post hysterectomy  . Persistent atrial fibrillation ([place])   . Personal history of colonic polyps 02/25/2014  . Personal history of DVT (deep vein thrombosis) X 2   "left"  . Sleep apnea   . Spinal stenosis     Tobacco History: Social History   Tobacco Use  Smoking Status Never Smoker  Smokeless Tobacco Never Used  Tobacco Comment   never used tobacco   Counseling given: Not Answered Comment: never used tobacco   Outpatient Medications Prior to Visit  Medication Sig Dispense Refill  . acetaminophen (TYLENOL) 325 MG tablet Take 325 mg by mouth daily.     Marland Kitchen apixaban (ELIQUIS) 5 MG TABS tablet TAKE ONE (1) TABLET BY MOUTH TWO (2) TIMES DAILY (Patient taking differently: Take 5 mg by mouth 2 (two) times daily. ) 180 tablet 1  . atorvastatin (LIPITOR) 20 MG tablet TAKE 1 TABLET BY MOUTH  DAILY AT 6 PM. (Patient taking differently: Take 20 mg by mouth every evening. ) 90 tablet 2  . bumetanide  (BUMEX) 1 MG tablet TAKE ONE (1) TABLET BY MOUTH EVERY DAY 90 tablet 1  . Calcium Carb-Cholecalciferol (CALCIUM 600 + D PO) Take 1 tablet by mouth 2 (two) times daily.    . Carboxymethylcellul-Glycerin (LUBRICATING EYE DROPS OP) Place 1 drop into both eyes daily as needed (dry eyes).    Marland Kitchen diltiazem (CARDIZEM CD) 120 MG 24 hr capsule Take 1 capsule (120 mg total) by mouth daily. 60 capsule 6  . dofetilide (TIKOSYN) 250 MCG capsule Take 1 capsule (250 mcg total) by mouth 2 (two) times daily. 60 capsule 8  . lisinopril (ZESTRIL) 10 MG tablet Take 1 tablet (10 mg total) by mouth daily. 90 tablet 1  . loratadine (CLARITIN) 10 MG tablet Take 10 mg by mouth daily.     . metoprolol succinate (TOPROL XL)  25 MG 24 hr tablet Take 1.5 tablets (37.5 mg total) by mouth 2 (two) times daily. 270 tablet 2  . potassium chloride (K-DUR) 10 MEQ tablet TAKE 1 TABLET BY MOUTH  EVERY MORNING AND 2 TABLETS BY MOUTH IN THE EVENING (Patient taking differently: Take 10-20 mEq by mouth See admin instructions. Take 10 meq in the morning and 20 meq in the evening) 270 tablet 2  . Probiotic CAPS Take 1 capsule by mouth daily.    . psyllium (REGULOID) 0.52 g capsule Take 0.52 g by mouth 2 (two) times daily.      No facility-administered medications prior to visit.     Review of Systems:   Constitutional:   No  weight loss, night sweats,  Fevers, chills, fatigue, or  lassitude.  HEENT:   No headaches,  Difficulty swallowing,  Tooth/dental problems, or  Sore throat,                No sneezing, itching, ear ache, nasal congestion, post nasal drip,   CV:  No chest pain,  Orthopnea, PND, + swelling in lower extremities, anasarca, dizziness, palpitations, syncope.   GI  No heartburn, indigestion, abdominal pain, nausea, vomiting, diarrhea, change in bowel habits, loss of appetite, bloody stools.   Resp:  .  No chest wall deformity  Skin: no rash or lesions.  GU: no dysuria, change in color of urine, no urgency or  frequency.  No flank pain, no hematuria   MS:  No joint pain or swelling.  No decreased range of motion.  No back pain.    Physical Exam  BP 122/74 (BP Location: Left Arm, Cuff Size: Normal)   Pulse 86   Temp (!) 97.1 F (36.2 C) (Temporal)   Ht 5\' 3"  (1.6 m)   Wt 270 lb 6.4 oz (122.7 kg)   SpO2 98% Comment: RA  BMI 47.90 kg/m   GEN: A/Ox3; pleasant , NAD, BMI 47    HEENT:  /AT,    NOSE-clear, THROAT-clear, no lesions, no postnasal drip or exudate noted.   NECK:  Supple w/ fair ROM; no JVD; normal carotid impulses w/o bruits; no thyromegaly or nodules palpated; no lymphadenopathy.    RESP  Clear  P & A; w/o, wheezes/ rales/ or rhonchi. no accessory muscle use, no dullness to percussion  CARD:  RRR, no m/r/g, no peripheral edema, pulses intact, no cyanosis or clubbing.  GI:   Soft & nt; nml bowel sounds; no organomegaly or masses detected.   Musco: Warm bil, no deformities or joint swelling noted.   Neuro: alert, no focal deficits noted.    Skin: Warm, no lesions or rashes    Lab Results:  CBC  BMET  No results found for: PROBNP  Imaging: No results found.    PFT Results Latest Ref Rng & Units 03/31/2019 11/15/2013  FVC-Pre L 1.94 2.24  FVC-Predicted Pre % 80 86  FVC-Post L 2.08 2.34  FVC-Predicted Post % 86 90  Pre FEV1/FVC % % 74 79  Post FEV1/FCV % % 76 83  FEV1-Pre L 1.42 1.78  FEV1-Predicted Pre % 80 92  FEV1-Post L 1.59 1.95  DLCO UNC% % 109 62  DLCO COR %Predicted % 125 78  TLC L 4.36 4.38  TLC % Predicted % 88 89  RV % Predicted % 94 87    No results found for: NITRICOXIDE      Assessment & Plan:   Pulmonary fibrosis, unspecified (HCC) Very mild subpleural fibrosis noted.  She  does have previous amiodarone use in 2011.  She has very little symptoms.  Autoimmune/CTD labs unrevealing.  We will recheck spirometry with DLCO in July for stability. Continue to follow Would avoid use of amiodarone in the future if able  Obstructive  sleep apnea Excellent control and compliance   Plan  Patient Instructions  Great job with BIPAP  Keep up good work .  Work on healthy weight loss Do not drive if sleepy.  Activity as tolerated.  Follow up with Dr. Halford Chessman and Aneudy Champlain NP  In 6 months with PFT (Spirometry with DLCO )  and As needed            Total patient care 31 min   Kenyada Hy, NP 09/20/2019

## 2019-09-20 NOTE — Patient Instructions (Signed)
Great job with BIPAP  Keep up good work .  Work on healthy weight loss Do not drive if sleepy.  Activity as tolerated.  Follow up with Dr. Halford Chessman and Nile Prisk NP  In 6 months with PFT (Spirometry with DLCO )  and As needed

## 2019-09-20 NOTE — Assessment & Plan Note (Signed)
Excellent control and compliance   Plan  Patient Instructions  Great job with BIPAP  Keep up good work .  Work on healthy weight loss Do not drive if sleepy.  Activity as tolerated.  Follow up with Dr. Halford Chessman and Jevin Camino NP  In 6 months with PFT (Spirometry with DLCO )  and As needed

## 2019-09-20 NOTE — Progress Notes (Signed)
Reviewed and agree with assessment/plan.   Zanasia Hickson, MD [facility] 08/26/2016, 12:24 PM Pager:  [phone]  

## 2019-10-02 ENCOUNTER — Other Ambulatory Visit: Payer: Self-pay | Admitting: Family Medicine

## 2019-10-10 DIAGNOSIS — Z23 Encounter for immunization: Secondary | ICD-10-CM | POA: Diagnosis not present

## 2019-10-16 ENCOUNTER — Other Ambulatory Visit: Payer: Self-pay | Admitting: Family Medicine

## 2019-10-17 ENCOUNTER — Encounter: Payer: Self-pay | Admitting: Internal Medicine

## 2019-10-17 ENCOUNTER — Telehealth (INDEPENDENT_AMBULATORY_CARE_PROVIDER_SITE_OTHER): Payer: Medicare Other | Admitting: Internal Medicine

## 2019-10-17 VITALS — BP 147/79 | HR 61 | Ht 63.0 in | Wt 270.0 lb

## 2019-10-17 DIAGNOSIS — R002 Palpitations: Secondary | ICD-10-CM | POA: Diagnosis not present

## 2019-10-17 DIAGNOSIS — D6869 Other thrombophilia: Secondary | ICD-10-CM | POA: Diagnosis not present

## 2019-10-17 DIAGNOSIS — G4733 Obstructive sleep apnea (adult) (pediatric): Secondary | ICD-10-CM

## 2019-10-17 DIAGNOSIS — I4819 Other persistent atrial fibrillation: Secondary | ICD-10-CM | POA: Diagnosis not present

## 2019-10-17 NOTE — Progress Notes (Signed)
Electrophysiology TeleHealth Note   Due to national recommendations of social distancing due to COVID 19, an audio/video telehealth visit is felt to be most appropriate for this patient at this time.  See MyChart message from today for the patient's consent to telehealth for [facility].  Date:  10/17/2019   ID:  Tracy Miles, DOB Sep 12, 1933, MRN [id number]  Location: patient's home  Provider location:  [facility]  Evaluation Performed: Follow-up visit  PCP:  [name], MD   Electrophysiologist:  Dr Rayann Heman  Chief Complaint:  palpitations  History of Present Illness:    Tracy Miles is a 86 y.o. female who presents via telehealth conferencing today.  Since last being seen in our clinic, the patient reports doing very well.  She has been at [employer] without leaving since February.  She is now vaccinated and is excited about being able to go out in public in the next few days.  She has had very rare afib.  she is pleased with her current health state.  She has palpitatons of unclear etiology  Today, she denies symptoms of chest pain, shortness of breath,  dizziness, presyncope, or syncope. Her edema is stable. The patient is otherwise without complaint today.    Past Medical History:  Diagnosis Date  . Anemia   . Aortic stenosis    Very mild  . Benign paroxysmal positional vertigo 12/17/2013  . Chicken pox as a child  . Colon polyp   . Coronary artery disease   . DDD (degenerative disc disease) 11/16/2013  . Hyperlipidemia   . Hypertension   . Iron deficiency anemia 11/13/2013  . Mumps child and teenager  . OA (osteoarthritis) 11/19/2013   S/p L TKR  . Obesity   . OSA on CPAP   . Pancreatitis    post hysterectomy  . Persistent atrial fibrillation ([place])   . Personal history of colonic polyps 02/25/2014  . Personal history of DVT (deep vein thrombosis) X 2   "left"  . Sleep apnea   . Spinal stenosis     Past Surgical History:  Procedure  Laterality Date  . APPENDECTOMY    . ATRIAL FIBRILLATION ABLATION N/A 04/25/2019   Procedure: ATRIAL FIBRILLATION ABLATION;  Surgeon: [name], MD;  Location: [place] [facility];  Service: Cardiovascular;  Laterality: N/A;  . CARDIOVERSION N/A 08/09/2015   Procedure: CARDIOVERSION;  Surgeon: Jerline Pain, MD;  Location: [place];  Service: Cardiovascular;  Laterality: N/A;  . CARDIOVERSION N/A 09/30/2018   Procedure: CARDIOVERSION;  Surgeon: [name], MD;  Location: [facility] ENDOSCOPY;  Service: Cardiovascular;  Laterality: N/A;  . CARDIOVERSION N/A 02/27/2019   Procedure: CARDIOVERSION;  Surgeon: Buford Dresser, MD;  Location[facility] ENDOSCOPY;  Service: Cardiovascular;  Laterality: N/A;  . CARDIOVERSION N/A 06/07/2019   Procedure: CARDIOVERSION;  Surgeon: [name], MD;  Location: [place];  Service: Cardiovascular;  Laterality: N/A;  . CATARACT EXTRACTION W/ INTRAOCULAR LENS  IMPLANT, BILATERAL Bilateral 2016  . CYST REMOVAL HAND Bilateral 1990's   "played to much golf"  . DILATION AND CURETTAGE OF UTERUS    . LUMBAR LAMINECTOMY  40 yrs ago   "L3-4"  . MENISCUS REPAIR Bilateral 2000 and 2010  . REPLACEMENT TOTAL KNEE Left 2013  . TEE WITHOUT CARDIOVERSION N/A 04/24/2019   Procedure: TRANSESOPHAGEAL ECHOCARDIOGRAM (TEE);  Surgeon: Acie Fredrickson Wonda Cheng, MD;  Location: [place];  Service: Cardiovascular;  Laterality: N/A;  . TONSILECTOMY, ADENOIDECTOMY, BILATERAL MYRINGOTOMY AND TUBES  child  .  TOTAL ABDOMINAL HYSTERECTOMY  1995   had 2 tumors- benign  . TUBAL LIGATION  86 years old    Current Outpatient Medications  Medication Sig Dispense Refill  . acetaminophen (TYLENOL) 325 MG tablet Take 325 mg by mouth daily.     Marland Kitchen atorvastatin (LIPITOR) 20 MG tablet TAKE 1 TABLET BY MOUTH  DAILY AT 6 PM. 90 tablet 2  . Calcium Carb-Cholecalciferol (CALCIUM 600 + D PO) Take 1 tablet by mouth 2 (two) times daily.    . Carboxymethylcellul-Glycerin (LUBRICATING EYE DROPS  OP) Place 1 drop into both eyes daily as needed (dry eyes).    Marland Kitchen diltiazem (CARDIZEM CD) 120 MG 24 hr capsule Take 1 capsule (120 mg total) by mouth daily. 60 capsule 6  . dofetilide (TIKOSYN) 250 MCG capsule Take 1 capsule (250 mcg total) by mouth 2 (two) times daily. 60 capsule 8  . ELIQUIS 5 MG TABS tablet TAKE ONE TABLET BY MOUTH TWICE DAILY 180 tablet 1  . lisinopril (ZESTRIL) 10 MG tablet Take 1 tablet (10 mg total) by mouth daily. 90 tablet 1  . loratadine (CLARITIN) 10 MG tablet Take 10 mg by mouth daily.     . metoprolol succinate (TOPROL XL) 25 MG 24 hr tablet Take 1.5 tablets (37.5 mg total) by mouth 2 (two) times daily. 270 tablet 2  . potassium chloride (K-DUR) 10 MEQ tablet TAKE 1 TABLET BY MOUTH  EVERY MORNING AND 2 TABLETS BY MOUTH IN THE EVENING 270 tablet 2  . Probiotic CAPS Take 1 capsule by mouth daily.    . psyllium (REGULOID) 0.52 g capsule Take 0.52 g by mouth 2 (two) times daily.     . bumetanide (BUMEX) 1 MG tablet TAKE ONE (1) TABLET BY MOUTH EVERY DAY 90 tablet 1   No current facility-administered medications for this visit.    Allergies:   Gabapentin, Lactose intolerance (gi), Zebeta [bisoprolol fumarate], Penicillins, and Sulfa antibiotics   Social History:  The patient  reports that she has never smoked. She has never used smokeless tobacco. She reports that she does not drink alcohol or use drugs.   Family History:  The patient's family history includes Atrial fibrillation in her son; COPD in her father; Cancer in her maternal grandfather and paternal grandmother; Cancer (age of onset: 57) in her son; Cancer (age of onset: 45) in her father; Dementia in her mother; Diabetes in her maternal grandmother; Gout in her son and son; Heart attack (age of onset: 70) in her mother; Heart disease in her brother; Hyperlipidemia in her brother, mother, son, and son; Hypertension in her brother; Pernicious anemia in her maternal grandmother and mother; Sleep apnea in her son;  Stroke in her paternal grandfather.   ROS:  Please see the history of present illness.   All other systems are personally reviewed and negative.    Exam:    Vital Signs:  BP (!) 147/79   Pulse 61   Ht 5\' 3"  (1.6 m)   Wt 270 lb (122.5 kg)   SpO2 96%   BMI 47.83 kg/m   Well sounding and appearing, alert and conversant, regular work of breathing,  good skin color Eyes- anicteric, neuro- grossly intact, skin- no apparent rash or lesions or cyanosis, mouth- oral mucosa is pink  Labs/Other Tests and Data Reviewed:    Recent Labs: 04/20/2019: ALT 11; TSH 2.630 06/02/2019: BUN 17; Creatinine, Ser 1.07; Hemoglobin 12.6; Magnesium 2.0; Platelets 260; Potassium 4.0; Sodium 138   Wt Readings from Last 3  Encounters:  10/17/19 270 lb (122.5 kg)  09/20/19 270 lb 6.4 oz (122.7 kg)  07/17/19 269 lb (122 kg)       ASSESSMENT & PLAN:    1.  Persistent atrial fibrillation Doing very well with tikosyn She would like to consider stopping this medicine but continues to have palpitations which she thinks could be afib.  She would like to further evaluate her palpitations.  Given the infrequent nature, I would advise long term monitoring with an ILR.  I think that this would be beneficial to allow [place] to better determine if she requires long term AAD therapy and to further characterize her palpitaitons post ablation. Risks including bleeding and infection were discussed today.  She would like to proceed at next available time. ekg 06/15/19 is reviewed and qt is ok Labs 06/02/19 are reviewed and normal chads2vasc score is 4.  She is on eliquis  The importance of regular monitoring of tikosyn labs and ekg to avoid toxicity has been discussed.  We will obtain ekg and labs on return.  2. Obesity We discussed lifestyle again today  3. HTN Stable No change required today  4. OSA Uses CPAP  Follow-up:  We will schedule ILR implant on my next DOD day as above   Patient Risk:  after full review of  this patients clinical status, I feel that they are at moderate risk at this time.  Today, I have spent 15 minutes with the patient with telehealth technology discussing arrhythmia management .    Army Fossa, MD  10/17/2019 12:17 PM     [address] [place] [address] [phone] (office) [phone] (fax)

## 2019-10-19 ENCOUNTER — Ambulatory Visit: Payer: Medicare Other | Admitting: Internal Medicine

## 2019-10-19 ENCOUNTER — Telehealth: Payer: Medicare Other | Admitting: Internal Medicine

## 2019-10-23 ENCOUNTER — Ambulatory Visit: Payer: Medicare Other | Admitting: Internal Medicine

## 2019-11-03 ENCOUNTER — Other Ambulatory Visit (HOSPITAL_COMMUNITY): Payer: Self-pay | Admitting: *Deleted

## 2019-11-03 MED ORDER — DILTIAZEM HCL ER COATED BEADS 120 MG PO CP24: 120.0000 mg | ORAL_CAPSULE | Freq: Every day | ORAL | 6 refills | Status: DC

## 2019-11-06 ENCOUNTER — Other Ambulatory Visit: Payer: Self-pay

## 2019-11-07 ENCOUNTER — Ambulatory Visit (INDEPENDENT_AMBULATORY_CARE_PROVIDER_SITE_OTHER): Payer: Medicare Other | Admitting: Family Medicine

## 2019-11-07 ENCOUNTER — Other Ambulatory Visit: Payer: Self-pay

## 2019-11-07 VITALS — BP 129/55 | HR 73 | Temp 96.4°F | Resp 12 | Ht 63.0 in | Wt 274.2 lb

## 2019-11-07 DIAGNOSIS — E6609 Other obesity due to excess calories: Secondary | ICD-10-CM

## 2019-11-07 DIAGNOSIS — R739 Hyperglycemia, unspecified: Secondary | ICD-10-CM | POA: Diagnosis not present

## 2019-11-07 DIAGNOSIS — E782 Mixed hyperlipidemia: Secondary | ICD-10-CM

## 2019-11-07 DIAGNOSIS — I4891 Unspecified atrial fibrillation: Secondary | ICD-10-CM

## 2019-11-07 DIAGNOSIS — D509 Iron deficiency anemia, unspecified: Secondary | ICD-10-CM

## 2019-11-07 DIAGNOSIS — I10 Essential (primary) hypertension: Secondary | ICD-10-CM

## 2019-11-07 LAB — COMPREHENSIVE METABOLIC PANEL
ALT: 13 U/L (ref 0–35)
AST: 18 U/L (ref 0–37)
Albumin: 4.2 g/dL (ref 3.5–5.2)
Alkaline Phosphatase: 116 U/L (ref 39–117)
BUN: 18 mg/dL (ref 6–23)
CO2: 32 mEq/L (ref 19–32)
Calcium: 9.6 mg/dL (ref 8.4–10.5)
Chloride: 98 mEq/L (ref 96–112)
Creatinine, Ser: 0.91 mg/dL (ref 0.40–1.20)
GFR: 58.96 mL/min — ABNORMAL LOW (ref >60.00)
Glucose, Bld: 94 mg/dL (ref 70–99)
Potassium: 4.4 mEq/L (ref 3.5–5.1)
Sodium: 139 mEq/L (ref 135–145)
Total Bilirubin: 0.7 mg/dL (ref 0.2–1.2)
Total Protein: 7 g/dL (ref 6.0–8.3)

## 2019-11-07 LAB — CBC
HCT: 39.6 % (ref 36.0–46.0)
Hemoglobin: 13.2 g/dL (ref 12.0–15.0)
MCHC: 33.4 g/dL (ref 30.0–36.0)
MCV: 93.4 fl (ref 78.0–100.0)
Platelets: 247 10*3/uL (ref 150.0–400.0)
RBC: 4.24 Mil/uL (ref 3.87–5.11)
RDW: 13.9 % (ref 11.5–15.5)
WBC: 6.7 10*3/uL (ref 4.0–10.5)

## 2019-11-07 LAB — TSH: TSH: 2.1 u[IU]/mL (ref 0.35–4.50)

## 2019-11-07 LAB — LIPID PANEL
Cholesterol: 161 mg/dL (ref 0–200)
HDL: 46.3 mg/dL (ref >39.00)
LDL Cholesterol: 87 mg/dL (ref 0–99)
NonHDL: 114.41
Total CHOL/HDL Ratio: 3
Triglycerides: 138 mg/dL (ref 0.0–149.0)
VLDL: 27.6 mg/dL (ref 0.0–40.0)

## 2019-11-07 LAB — HEMOGLOBIN A1C: Hgb A1c MFr Bld: 5.9 % (ref 4.6–6.5)

## 2019-11-07 MED ORDER — LISINOPRIL 10 MG PO TABS: 10.0000 mg | ORAL_TABLET | Freq: Every day | ORAL | 1 refills | Status: DC

## 2019-11-07 NOTE — Patient Instructions (Signed)
Biotin and fatty acid supplement such as Flaxseed oil, zinc 25-50 mg for hair loss.    Alopecia Areata, Adult  Alopecia areata is a condition that causes you to lose hair. You may lose hair on your scalp in patches. In some cases, you may lose all the hair on your scalp (alopecia totalis) or all the hair from your face and body (alopecia universalis). Alopecia areata is an autoimmune disease. This means that your body's defense system (immune system) mistakes normal parts of the body for germs or other things that can make you sick. When you have alopecia areata, the immune system attacks the hair follicles. Alopecia areata usually develops in childhood, but it can develop at any age. For some people, their hair grows back on its own and hair loss does not happen again. For others, their hair may fall out and grow back in cycles. The hair loss may last many years. Having this condition can be emotionally difficult, but it is not dangerous. What are the causes? The cause of this condition is not known. What increases the risk? This condition is more likely to develop in people who have:  A family history of alopecia.  A family history of another autoimmune disease, including type 1 diabetes and rheumatoid arthritis.  Asthma and allergies.  Down syndrome. What are the signs or symptoms? Round spots of patchy hair loss on the scalp is the main symptom of this condition. The spots may be mildly itchy. Other symptoms include:  Short dark hairs in the bald patches that are wider at the top (exclamation point hairs).  Dents, white spots, or lines in the fingernails or toenails.  Balding and body hair loss. This is rare. How is this diagnosed? This condition is diagnosed based on your symptoms and family history. Your health care provider will also check your scalp skin, teeth, and nails. Your health care provider may refer you to a specialist in hair and skin disorders (dermatologist). You may  also have tests, including:  A hair pull test.  Blood tests or other screening tests to check for autoimmune diseases, such as thyroid disease or diabetes.  Skin biopsy to confirm the diagnosis.  A procedure to examine the skin with a lighted magnifying instrument (dermoscopy). How is this treated? There is no cure for alopecia areata. Treatment is aimed at promoting the regrowth of hair and preventing the immune system from overreacting. No single treatment is right for all people with alopecia areata. It depends on the type of hair loss you have and how severe it is. Work with your health care provider to find the best treatment for you. Treatment may include:  Having regular checkups to make sure the condition is not getting worse (watchful waiting).  Steroid creams or pills for 6-8 weeks to stop the immune reaction and help hair to regrow more quickly.  Other topical medicines to alter the immune system response and support the hair growth cycle.  Steroid injections.  Therapy and counseling with a support group or therapist if you are having trouble coping with hair loss. Follow these instructions at home:  Learn as much as you can about your condition.  Apply topical creams only as told by your health care provider.  Take over-the-counter and prescription medicines only as told by your health care provider.  Consider getting a wig or products to make hair look fuller or to cover bald spots, if you feel uncomfortable with your appearance.  Get therapy or counseling  if you are having a hard time coping with hair loss. Ask your health care provider to recommend a counselor or support group.  Keep all follow-up visits as told by your health care provider. This is important. Contact a health care provider if:  Your hair loss gets worse, even with treatment.  You have new symptoms.  You are struggling emotionally. Summary  Alopecia areata is an autoimmune condition that  makes your body's defense system (immune system) attack the hair follicles. This causes you to lose hair.  Treatments may include regular checkups to make sure that the condition is not getting worse (watchful waiting), medicines, and steroid injections. This information is not intended to replace advice given to you by your health care provider. Make sure you discuss any questions you have with your health care provider. Document Revised: 07/30/2017 Document Reviewed: 09/04/2016 Elsevier Patient Education  2020 [employer].

## 2019-11-07 NOTE — Progress Notes (Signed)
Subjective:    Patient ID: Tracy Miles, female    DOB: 10/07/1933, 86 y.o.   MRN: [id number]  Chief Complaint  Patient presents with  . Hypertension    HPI Patient is in today for follow up on chronic medical concerns. No recent febrile illness or hospitalizations. She has been following closely with cardiology due to trouble with her Atrial fibrillation. She did not tolerate Amiodarone and has suffered some pulmonary toxicity. She has had to have an cardiac ablation and cardioversion. She is feeling well today. She is using her Bipap regularly. Denies CP/palp/SOB/HA/congestion/fevers/GI or GU c/o. Taking meds as prescribed  Past Medical History:  Diagnosis Date  . Anemia   . Aortic stenosis    Very mild  . Benign paroxysmal positional vertigo 12/17/2013  . Chicken pox as a child  . Colon polyp   . Coronary artery disease   . DDD (degenerative disc disease) 11/16/2013  . Hyperlipidemia   . Hypertension   . Iron deficiency anemia 11/13/2013  . Mumps child and teenager  . OA (osteoarthritis) 11/19/2013   S/p L TKR  . Obesity   . OSA on CPAP   . Pancreatitis    post hysterectomy  . Persistent atrial fibrillation ([place])   . Personal history of colonic polyps 02/25/2014  . Personal history of DVT (deep vein thrombosis) X 2   "left"  . Sleep apnea   . Spinal stenosis     Past Surgical History:  Procedure Laterality Date  . APPENDECTOMY    . ATRIAL FIBRILLATION ABLATION N/A 04/25/2019   Procedure: ATRIAL FIBRILLATION ABLATION;  Surgeon: [name], MD;  Location: [place] [facility];  Service: Cardiovascular;  Laterality: N/A;  . CARDIOVERSION N/A 08/09/2015   Procedure: CARDIOVERSION;  Surgeon: Jerline Pain, MD;  Location: [place];  Service: Cardiovascular;  Laterality: N/A;  . CARDIOVERSION N/A 09/30/2018   Procedure: CARDIOVERSION;  Surgeon: [name], MD;  Location: [facility];  Service: Cardiovascular;  Laterality: N/A;  . CARDIOVERSION N/A 02/27/2019   Procedure: CARDIOVERSION;  Surgeon: Buford Dresser, MD;  Location: [facility] ENDOSCOPY;  Service: Cardiovascular;  Laterality: N/A;  . CARDIOVERSION N/A 06/07/2019   Procedure: CARDIOVERSION;  Surgeon: [name], MD;  Location: [place];  Service: Cardiovascular;  Laterality: N/A;  . CATARACT EXTRACTION W/ INTRAOCULAR LENS  IMPLANT, BILATERAL Bilateral 2016  . CYST REMOVAL HAND Bilateral 1990's   "played to much golf"  . DILATION AND CURETTAGE OF UTERUS    . LUMBAR LAMINECTOMY  40 yrs ago   "L3-4"  . MENISCUS REPAIR Bilateral 2000 and 2010  . REPLACEMENT TOTAL KNEE Left 2013  . TEE WITHOUT CARDIOVERSION N/A 04/24/2019   Procedure: TRANSESOPHAGEAL ECHOCARDIOGRAM (TEE);  Surgeon: Acie Fredrickson Wonda Cheng, MD;  Location: [place];  Service: Cardiovascular;  Laterality: N/A;  . TONSILECTOMY, ADENOIDECTOMY, BILATERAL MYRINGOTOMY AND TUBES  child  . TOTAL ABDOMINAL HYSTERECTOMY  1995   had 2 tumors- benign  . TUBAL LIGATION  86 years old    Family History  Problem Relation Age of Onset  . Heart attack Mother 73  . Hyperlipidemia Mother        ?  Marland Kitchen Dementia Mother   . Pernicious anemia Mother   . COPD Father        smoker  . Cancer Father 60       prostate  . Heart disease Brother        quadruple bipass surgery  . Hyperlipidemia Brother   . Hypertension Brother   .  Diabetes Maternal Grandmother        type 2  . Pernicious anemia Maternal Grandmother   . Gout Son   . Atrial fibrillation Son   . Hyperlipidemia Son   . Cancer Maternal Grandfather        liver  . Cancer Paternal Grandmother        lung- doesn't think she smokes?  . Stroke Paternal Grandfather   . Cancer Son 50       non hodgin's lymphoma  . Gout Son   . Hyperlipidemia Son   . Sleep apnea Son   . Colon cancer Neg Hx   . Pancreatic cancer Neg Hx   . Stomach cancer Neg Hx   . Throat cancer Neg Hx   . Liver disease Neg Hx     Social History   Socioeconomic History  . Marital status: Widowed     Spouse name: Not on file  . Number of children: 3  . Years of education: Not on file  . Highest education level: Not on file  Occupational History  . Occupation: retired Pharmacist, hospital  Tobacco Use  . Smoking status: Never Smoker  . Smokeless tobacco: Never Used  . Tobacco comment: never used tobacco  Substance and Sexual Activity  . Alcohol use: No  . Drug use: No  . Sexual activity: Never    Comment: lives at [place], low sodium diet  Other Topics Concern  . Not on file  Social History Narrative  . Not on file   Social Determinants of Health   Financial Resource Strain:   . Difficulty of Paying Living Expenses: Not on file  Food Insecurity:   . Worried About Charity fundraiser in the Last Year: Not on file  . Ran Out of Food in the Last Year: Not on file  Transportation Needs:   . Lack of Transportation (Medical): Not on file  . Lack of Transportation (Non-Medical): Not on file  Physical Activity:   . Days of Exercise per Week: Not on file  . Minutes of Exercise per Session: Not on file  Stress:   . Feeling of Stress : Not on file  Social Connections:   . Frequency of Communication with Friends and Family: Not on file  . Frequency of Social Gatherings with Friends and Family: Not on file  . Attends Religious Services: Not on file  . Active Member of Clubs or Organizations: Not on file  . Attends Archivist Meetings: Not on file  . Marital Status: Not on file  Intimate Partner Violence:   . Fear of Current or Ex-Partner: Not on file  . Emotionally Abused: Not on file  . Physically Abused: Not on file  . Sexually Abused: Not on file    Outpatient Medications Prior to Visit  Medication Sig Dispense Refill  . acetaminophen (TYLENOL) 325 MG tablet Take 325 mg by mouth daily.     Marland Kitchen atorvastatin (LIPITOR) 20 MG tablet TAKE 1 TABLET BY MOUTH  DAILY AT 6 PM. 90 tablet 2  . bumetanide (BUMEX) 1 MG tablet TAKE ONE (1) TABLET BY MOUTH EVERY DAY 90 tablet 1  .  Calcium Carb-Cholecalciferol (CALCIUM 600 + D PO) Take 1 tablet by mouth 2 (two) times daily.    . Carboxymethylcellul-Glycerin (LUBRICATING EYE DROPS OP) Place 1 drop into both eyes daily as needed (dry eyes).    Marland Kitchen diltiazem (CARDIZEM CD) 120 MG 24 hr capsule Take 1 capsule (120 mg total) by mouth daily.  60 capsule 6  . dofetilide (TIKOSYN) 250 MCG capsule Take 1 capsule (250 mcg total) by mouth 2 (two) times daily. 60 capsule 8  . ELIQUIS 5 MG TABS tablet TAKE ONE TABLET BY MOUTH TWICE DAILY 180 tablet 1  . loratadine (CLARITIN) 10 MG tablet Take 10 mg by mouth daily.     . metoprolol succinate (TOPROL XL) 25 MG 24 hr tablet Take 1.5 tablets (37.5 mg total) by mouth 2 (two) times daily. 270 tablet 2  . potassium chloride (K-DUR) 10 MEQ tablet TAKE 1 TABLET BY MOUTH  EVERY MORNING AND 2 TABLETS BY MOUTH IN THE EVENING 270 tablet 2  . Probiotic CAPS Take 1 capsule by mouth daily.    . psyllium (REGULOID) 0.52 g capsule Take 0.52 g by mouth 2 (two) times daily.     Marland Kitchen lisinopril (ZESTRIL) 10 MG tablet Take 1 tablet (10 mg total) by mouth daily. 90 tablet 1   No facility-administered medications prior to visit.    Allergies  Allergen Reactions  . Gabapentin Swelling  . Lactose Intolerance (Gi) Other (See Comments)    Bothers her stomach  . Zebeta [Bisoprolol Fumarate] Nausea Only  . Penicillins Hives and Rash    Did it involve swelling of the face/tongue/throat, SOB, or low BP? No Did it involve sudden or severe rash/hives, skin peeling, or any reaction on the inside of your mouth or nose? Yes Did you need to seek medical attention at a hospital or doctor's office? No When did it last happen? 25+ years If all above answers are "NO", may proceed with cephalosporin use.   . Sulfa Antibiotics Rash    Review of Systems  Constitutional: Positive for malaise/fatigue. Negative for fever.  HENT: Negative for congestion.   Eyes: Negative for blurred vision.  Respiratory: Negative for  shortness of breath.   Cardiovascular: Negative for chest pain, palpitations and leg swelling.  Gastrointestinal: Negative for abdominal pain, blood in stool and nausea.  Genitourinary: Negative for dysuria and frequency.  Musculoskeletal: Negative for falls.  Skin: Positive for itching. Negative for rash.  Neurological: Negative for dizziness, loss of consciousness and headaches.  Endo/Heme/Allergies: Negative for environmental allergies.  Psychiatric/Behavioral: Negative for depression. The patient is not nervous/anxious.        Objective:    Physical Exam Vitals and nursing note reviewed.  Constitutional:      General: She is not in acute distress.    Appearance: She is well-developed.  HENT:     Head: Normocephalic and atraumatic.     Nose: Nose normal.  Eyes:     General:        Right eye: No discharge.        Left eye: No discharge.  Cardiovascular:     Rate and Rhythm: Normal rate. Rhythm irregular.     Heart sounds: No murmur.  Pulmonary:     Effort: Pulmonary effort is normal.     Breath sounds: Normal breath sounds.  Abdominal:     General: Bowel sounds are normal.     Palpations: Abdomen is soft.     Tenderness: There is no abdominal tenderness.  Musculoskeletal:     Cervical back: Normal range of motion and neck supple.  Skin:    General: Skin is warm and dry.  Neurological:     Mental Status: She is alert and oriented to person, place, and time.     BP (!) 129/55 (BP Location: Left Wrist, Cuff Size: Normal)   Pulse 73  Temp (!) 96.4 F (35.8 C) (Temporal)   Resp 12   Ht 5\' 3"  (1.6 m)   Wt 274 lb 3.2 oz (124.4 kg)   SpO2 100%   BMI 48.57 kg/m  Wt Readings from Last 3 Encounters:  11/07/19 274 lb 3.2 oz (124.4 kg)  10/17/19 270 lb (122.5 kg)  09/20/19 270 lb 6.4 oz (122.7 kg)    Diabetic Foot Exam - Simple   No data filed     Lab Results  Component Value Date   WBC 6.7 11/07/2019   HGB 13.2 11/07/2019   HCT 39.6 11/07/2019   PLT 247.0  11/07/2019   GLUCOSE 94 11/07/2019   CHOL 161 11/07/2019   TRIG 138.0 11/07/2019   HDL 46.30 11/07/2019   LDLCALC 87 11/07/2019   ALT 13 11/07/2019   AST 18 11/07/2019   NA 139 11/07/2019   K 4.4 11/07/2019   CL 98 11/07/2019   CREATININE 0.91 11/07/2019   BUN 18 11/07/2019   CO2 32 11/07/2019   TSH 2.10 11/07/2019   INR 1.55 (H) 08/06/2015   HGBA1C 5.9 11/07/2019    Lab Results  Component Value Date   TSH 2.10 11/07/2019   Lab Results  Component Value Date   WBC 6.7 11/07/2019   HGB 13.2 11/07/2019   HCT 39.6 11/07/2019   MCV 93.4 11/07/2019   PLT 247.0 11/07/2019   Lab Results  Component Value Date   NA 139 11/07/2019   K 4.4 11/07/2019   CO2 32 11/07/2019   GLUCOSE 94 11/07/2019   BUN 18 11/07/2019   CREATININE 0.91 11/07/2019   BILITOT 0.7 11/07/2019   ALKPHOS 116 11/07/2019   AST 18 11/07/2019   ALT 13 11/07/2019   PROT 7.0 11/07/2019   ALBUMIN 4.2 11/07/2019   CALCIUM 9.6 11/07/2019   ANIONGAP 10 06/02/2019   GFR 58.96 (L) 11/07/2019   Lab Results  Component Value Date   CHOL 161 11/07/2019   Lab Results  Component Value Date   HDL 46.30 11/07/2019   Lab Results  Component Value Date   LDLCALC 87 11/07/2019   Lab Results  Component Value Date   TRIG 138.0 11/07/2019   Lab Results  Component Value Date   CHOLHDL 3 11/07/2019   Lab Results  Component Value Date   HGBA1C 5.9 11/07/2019       Assessment & Plan:   Problem List Items Addressed This Visit    Atrial fibrillation ([place])    Has had a rough time since her last visit having had both a cardioversion and ablation. She feels well today and is following with cardiology.       Relevant Medications   lisinopril (ZESTRIL) 10 MG tablet   Essential hypertension    Well controlled, no changes to meds. Encouraged heart healthy diet such as the DASH diet and exercise as tolerated.       Relevant Medications   lisinopril (ZESTRIL) 10 MG tablet   Other Relevant Orders   CBC    Comprehensive metabolic panel   TSH   Hyperlipidemia, mixed   Relevant Medications   lisinopril (ZESTRIL) 10 MG tablet   Other Relevant Orders   Lipid panel   Iron deficiency anemia - Primary   Obesity    Encouraged DASH or MIND diet, decrease po intake and increase exercise as tolerated. Needs 7-8 hours of sleep nightly. Avoid trans fats, eat small, frequent meals every 4-5 hours with lean proteins, complex carbs and healthy fats. Minimize simple carbs  Hyperglycemia   Relevant Orders   Hemoglobin A1c      I am having Tracy Miles "Tracy Miles" maintain her loratadine, psyllium, acetaminophen, atorvastatin, potassium chloride, Calcium Carb-Cholecalciferol (CALCIUM 600 + D PO), Probiotic, Carboxymethylcellul-Glycerin (LUBRICATING EYE DROPS OP), metoprolol succinate, dofetilide, Eliquis, bumetanide, diltiazem, and lisinopril.  Meds ordered this encounter  Medications  . lisinopril (ZESTRIL) 10 MG tablet    Sig: Take 1 tablet (10 mg total) by mouth daily.    Dispense:  90 tablet    Refill:  1     [name], MD

## 2019-11-07 NOTE — Assessment & Plan Note (Signed)
Well controlled, no changes to meds. Encouraged heart healthy diet such as the DASH diet and exercise as tolerated.  °

## 2019-11-08 NOTE — Assessment & Plan Note (Signed)
Encouraged DASH or MIND diet, decrease po intake and increase exercise as tolerated. Needs 7-8 hours of sleep nightly. Avoid trans fats, eat small, frequent meals every 4-5 hours with lean proteins, complex carbs and healthy fats. Minimize simple carbs

## 2019-11-08 NOTE — Assessment & Plan Note (Signed)
Has had a rough time since her last visit having had both a cardioversion and ablation. She feels well today and is following with cardiology.

## 2019-11-20 ENCOUNTER — Ambulatory Visit (INDEPENDENT_AMBULATORY_CARE_PROVIDER_SITE_OTHER): Payer: Medicare Other | Admitting: Internal Medicine

## 2019-11-20 ENCOUNTER — Other Ambulatory Visit: Payer: Self-pay

## 2019-11-20 ENCOUNTER — Encounter: Payer: Self-pay | Admitting: Internal Medicine

## 2019-11-20 VITALS — BP 126/80 | HR 79 | Ht 63.0 in | Wt 278.0 lb

## 2019-11-20 DIAGNOSIS — I119 Hypertensive heart disease without heart failure: Secondary | ICD-10-CM

## 2019-11-20 DIAGNOSIS — I4819 Other persistent atrial fibrillation: Secondary | ICD-10-CM | POA: Diagnosis not present

## 2019-11-20 DIAGNOSIS — G4733 Obstructive sleep apnea (adult) (pediatric): Secondary | ICD-10-CM

## 2019-11-20 DIAGNOSIS — D6869 Other thrombophilia: Secondary | ICD-10-CM

## 2019-11-20 HISTORY — PX: OTHER SURGICAL HISTORY: SHX169

## 2019-11-20 NOTE — Progress Notes (Signed)
PCP: [name], MD   [name] is a 86 y.o. female who presents today for routine electrophysiology followup.  Since last being seen in our clinic, the patient reports doing very well.  She continues to have occasional palpitations of unclear significance. Today, she denies symptoms of chest pain, shortness of breath,  lower extremity edema, dizziness, presyncope, or syncope.  The patient is otherwise without complaint today.   Past Medical History:  Diagnosis Date  . Anemia   . Aortic stenosis    Very mild  . Benign paroxysmal positional vertigo 12/17/2013  . Chicken pox as a child  . Colon polyp   . Coronary artery disease   . DDD (degenerative disc disease) 11/16/2013  . Hyperlipidemia   . Hypertension   . Iron deficiency anemia 11/13/2013  . Mumps child and teenager  . OA (osteoarthritis) 11/19/2013   S/p L TKR  . Obesity   . OSA on CPAP   . Pancreatitis    post hysterectomy  . Persistent atrial fibrillation ([place])   . Personal history of colonic polyps 02/25/2014  . Personal history of DVT (deep vein thrombosis) X 2   "left"  . Sleep apnea   . Spinal stenosis    Past Surgical History:  Procedure Laterality Date  . APPENDECTOMY    . ATRIAL FIBRILLATION ABLATION N/A 04/25/2019   Procedure: ATRIAL FIBRILLATION ABLATION;  Surgeon: [name], MD;  Location: [place] [facility];  Service: Cardiovascular;  Laterality: N/A;  . CARDIOVERSION N/A 08/09/2015   Procedure: CARDIOVERSION;  Surgeon: Jerline Pain, MD;  Location: [place];  Service: Cardiovascular;  Laterality: N/A;  . CARDIOVERSION N/A 09/30/2018   Procedure: CARDIOVERSION;  Surgeon: [name], MD;  Location: [facility] ENDOSCOPY;  Service: Cardiovascular;  Laterality: N/A;  . CARDIOVERSION N/A 02/27/2019   Procedure: CARDIOVERSION;  Surgeon: Buford Dresser, MD;  Location: [facility] ENDOSCOPY;  Service: Cardiovascular;  Laterality: N/A;  . CARDIOVERSION N/A 06/07/2019   Procedure: CARDIOVERSION;  Surgeon: [name], MD;  Location: [place];  Service: Cardiovascular;  Laterality: N/A;  . CATARACT EXTRACTION W/ INTRAOCULAR LENS  IMPLANT, BILATERAL Bilateral 2016  . CYST REMOVAL HAND Bilateral 1990's   "played to much golf"  . DILATION AND CURETTAGE OF UTERUS    . LUMBAR LAMINECTOMY  40 yrs ago   "L3-4"  . MENISCUS REPAIR Bilateral 2000 and 2010  . REPLACEMENT TOTAL KNEE Left 2013  . TEE WITHOUT CARDIOVERSION N/A 04/24/2019   Procedure: TRANSESOPHAGEAL ECHOCARDIOGRAM (TEE);  Surgeon: Acie Fredrickson Wonda Cheng, MD;  Location: [place];  Service: Cardiovascular;  Laterality: N/A;  . TONSILECTOMY, ADENOIDECTOMY, BILATERAL MYRINGOTOMY AND TUBES  child  . TOTAL ABDOMINAL HYSTERECTOMY  1995   had 2 tumors- benign  . TUBAL LIGATION  86 years old    ROS- all systems are reviewed and negatives except as per HPI above  Current Outpatient Medications  Medication Sig Dispense Refill  . acetaminophen (TYLENOL) 325 MG tablet Take 325 mg by mouth daily.     Marland Kitchen atorvastatin (LIPITOR) 20 MG tablet TAKE 1 TABLET BY MOUTH  DAILY AT 6 PM. 90 tablet 2  . Biotin 5000 MCG CAPS Take 1 capsule by mouth daily.    . bumetanide (BUMEX) 1 MG tablet TAKE ONE (1) TABLET BY MOUTH EVERY DAY 90 tablet 1  . Calcium Carb-Cholecalciferol (CALCIUM 600 + D PO) Take 1 tablet by mouth 2 (two) times daily.    . Carboxymethylcellul-Glycerin (LUBRICATING EYE DROPS OP) Place  1 drop into both eyes daily as needed (dry eyes).    Marland Kitchen diltiazem (CARDIZEM CD) 120 MG 24 hr capsule Take 1 capsule (120 mg total) by mouth daily. 60 capsule 6  . dofetilide (TIKOSYN) 250 MCG capsule Take 1 capsule (250 mcg total) by mouth 2 (two) times daily. 60 capsule 8  . ELIQUIS 5 MG TABS tablet TAKE ONE TABLET BY MOUTH TWICE DAILY 180 tablet 1  . lisinopril (ZESTRIL) 10 MG tablet Take 1 tablet (10 mg total) by mouth daily. 90 tablet 1  . loratadine (CLARITIN) 10 MG tablet Take 10 mg by mouth daily.     . metoprolol  succinate (TOPROL XL) 25 MG 24 hr tablet Take 1.5 tablets (37.5 mg total) by mouth 2 (two) times daily. 270 tablet 2  . potassium chloride (K-DUR) 10 MEQ tablet TAKE 1 TABLET BY MOUTH  EVERY MORNING AND 2 TABLETS BY MOUTH IN THE EVENING 270 tablet 2  . Probiotic CAPS Take 1 capsule by mouth daily.    . psyllium (REGULOID) 0.52 g capsule Take 0.52 g by mouth 2 (two) times daily.      No current facility-administered medications for this visit.    Physical Exam: Vitals:   11/20/19 1053  BP: 126/80  Pulse: 79  SpO2: 97%  Weight: 278 lb (126.1 kg)  Height: 5\' 3"  (1.6 m)    GEN- The patient is well appearing, alert and oriented x 3 today.   Head- normocephalic, atraumatic Eyes-  Sclera clear, conjunctiva pink Ears- hearing intact Oropharynx- clear Lungs-  normal work of breathing Heart- Regular rate and rhythm,  GI- soft,  Extremities- no clubbing, cyanosis, or edema  Wt Readings from Last 3 Encounters:  11/20/19 278 lb (126.1 kg)  11/07/19 274 lb 3.2 oz (124.4 kg)  10/17/19 270 lb (122.5 kg)    EKG tracing ordered today is personally reviewed and shows sinus rhythm 79 bpm, PR 200 msec, Qtc 472 msec  Assessment and Plan:  1. Persistent atrial fibrillation Appears to be well controlled with tikosyn Qt is stable. Labs ordered today Lifestyle modification encouraged The importance of close follow-up on tikosyn to avoid toxicity was discussed today. She continues to have palpitations of unclear significance.  I would advise ILR to further evaluate.  I would therefore advise implantation of an implantable loop recorder for long term arrhythmia monitoring.  Risks and benefits to ILR were discussed at length with the patient today, including but not limited to risks of bleeding and infection.  Extensive device education was performed.  Remote monitoring was also discussed at length today.  The patient understands and wishes to proceed.  We will proceed at this time with ILR  implantation.  2. Hypertensive cardiovascular disease Stable No change required today  3. Obesity Body mass index is 49.25 kg/m. Lifestyle modification is strongly advised  4. OSA Uses CPAP  [name] MD, [facility] 11/20/2019 11:32 AM      DESCRIPTION OF PROCEDURE:  Informed written consent was obtained.  The patient required no sedation for the procedure today.  The patients left chest was prepped and draped. Mapping over the patient's chest was performed to identify the appropriate ILR site.  This area was found to be the left parasternal region over the 3rd-4th intercostal space.  The skin overlying this region was infiltrated with lidocaine for local analgesia.  A 0.5-cm incision was made at the implant site.  A subcutaneous ILR pocket was fashioned using a combination of sharp and blunt dissection.  A Medtronic Reveal Linq model LNQ 22 (model number [id number]) implantable loop recorder was then placed into the pocket R waves were very prominent and measured > 0.2 mV. EBL<1 ml.  Steri- Strips and a sterile dressing were then applied.  There were no early apparent complications.     CONCLUSIONS:   1. Successful implantation of a Medtronic Reveal LINQ implantable loop recorder for further evaluate palpitations and for afib management  2. No early apparent complications.   [name] MD, [facility] 11/20/2019 11:32 AM

## 2019-11-20 NOTE — Patient Instructions (Signed)
Medication Instructions:  Your physician recommends that you continue on your current medications as directed. Please refer to the Current Medication list given to you today.  *If you need a refill on your cardiac medications before your next appointment, please call your pharmacy*   Lab Work: None ordered   Testing/Procedures: None ordered   Follow-Up: At [facility], you and your health needs are our priority.  As part of our continuing mission to provide you with exceptional heart care, we have created designated Provider Care Teams.  These Care Teams include your primary Cardiologist (physician) and Advanced Practice Providers (APPs -  Physician Assistants and Nurse Practitioners) who all work together to provide you with the care you need, when you need it.  Your next appointment:   3 month(s)  The format for your next appointment:   In Person  Provider:   [name], MD   Thank you for choosing [facility]!!     Other Instructions

## 2019-11-26 ENCOUNTER — Other Ambulatory Visit (HOSPITAL_COMMUNITY): Payer: Self-pay | Admitting: Nurse Practitioner

## 2019-11-26 ENCOUNTER — Other Ambulatory Visit: Payer: Self-pay | Admitting: Family Medicine

## 2019-11-28 ENCOUNTER — Other Ambulatory Visit: Payer: Self-pay

## 2019-11-28 ENCOUNTER — Ambulatory Visit (INDEPENDENT_AMBULATORY_CARE_PROVIDER_SITE_OTHER): Payer: Medicare Other | Admitting: Student

## 2019-11-28 DIAGNOSIS — I4819 Other persistent atrial fibrillation: Secondary | ICD-10-CM

## 2019-11-28 LAB — CUP PACEART INCLINIC DEVICE CHECK
Date Time Interrogation Session: 20210330104109
Implantable Pulse Generator Implant Date: 20210322

## 2019-11-28 NOTE — Progress Notes (Signed)
ILR wound check in clinic. Steri strips previously removed. Wound well healed. Surrounding area with reaction to tape; pt states she has very sensitive skin. Home monitor transmitting nightly. No episodes. Questions answered.

## 2019-12-21 ENCOUNTER — Ambulatory Visit (INDEPENDENT_AMBULATORY_CARE_PROVIDER_SITE_OTHER): Payer: Medicare Other | Admitting: *Deleted

## 2019-12-21 DIAGNOSIS — I480 Paroxysmal atrial fibrillation: Secondary | ICD-10-CM | POA: Diagnosis not present

## 2019-12-22 LAB — CUP PACEART REMOTE DEVICE CHECK
Date Time Interrogation Session: 20210422153141
Implantable Pulse Generator Implant Date: 20210322

## 2019-12-22 NOTE — Progress Notes (Signed)
ILR Remote 

## 2020-01-22 LAB — CUP PACEART REMOTE DEVICE CHECK
Date Time Interrogation Session: 20210523153159
Implantable Pulse Generator Implant Date: 20210322

## 2020-01-23 ENCOUNTER — Ambulatory Visit (INDEPENDENT_AMBULATORY_CARE_PROVIDER_SITE_OTHER): Payer: Medicare Other | Admitting: *Deleted

## 2020-01-23 DIAGNOSIS — I480 Paroxysmal atrial fibrillation: Secondary | ICD-10-CM

## 2020-01-23 NOTE — Progress Notes (Signed)
Carelink Summary Report / Loop Recorder 

## 2020-02-09 ENCOUNTER — Telehealth: Payer: Self-pay

## 2020-02-09 NOTE — Telephone Encounter (Signed)
Carelink alert received- AF alert, 6 events and presenting appears ongoing AF w/ VR 140's.Current meds include-  cardizem 120mg  daily, tikosyn 220mcg BID, toprol 37.5mg  BID, eliquis for [facility]  Spoke with pt, she reports she was aware of going into AF yesterday afternoon.  Symptoms include fatigue and some SOB with exertion.  She thought she was out of it this AM when she checked her HR was in the 80s and regular, however at time of call she checked and it was irregular 100-120.  Pt stated that this is not new for her, she is not sure of what keep triggering it.    Advised would forward info to AF clinic as she has been seen there in the past.  Also reviewed with pt ED precautions.    Pt has f/u appt with Dr. Rayann Heman on 02/26/20   Most recent with EGM avail

## 2020-02-09 NOTE — Telephone Encounter (Signed)
Discussed with [name] NP - will increase metoprolol to 50mg  twice a day over weekend (return to normal dosing if converts) and call on Monday with update. Pt BP 130s/90s upon checking. Pt verbalized understanding and is otherwise asymptomatic.

## 2020-02-12 NOTE — Telephone Encounter (Signed)
Patient is back in NSR in the 70s. She went back to her normal dosing of metoprolol last PM. Of note patient was recently on prednisone taper for mouth sores. She completed the taper now. Pt will use extra 1/2 of metoprolol only as needed for breakthrough. Has follow up with Dr. Rayann Heman in 2 weeks. Pt will call if further issues.

## 2020-02-22 ENCOUNTER — Telehealth: Payer: Self-pay

## 2020-02-22 NOTE — Telephone Encounter (Signed)
Spoke with pt regarding appt on 02/26/20. Pt stated she called and confirmed virtual appt yesterday and did not have any questions at this time.

## 2020-02-26 ENCOUNTER — Encounter: Payer: Self-pay | Admitting: Internal Medicine

## 2020-02-26 ENCOUNTER — Telehealth (INDEPENDENT_AMBULATORY_CARE_PROVIDER_SITE_OTHER): Payer: Medicare Other | Admitting: Internal Medicine

## 2020-02-26 ENCOUNTER — Other Ambulatory Visit: Payer: Self-pay

## 2020-02-26 VITALS — BP 147/90 | HR 67 | Ht 63.0 in | Wt 276.0 lb

## 2020-02-26 DIAGNOSIS — I4819 Other persistent atrial fibrillation: Secondary | ICD-10-CM

## 2020-02-26 DIAGNOSIS — G4733 Obstructive sleep apnea (adult) (pediatric): Secondary | ICD-10-CM | POA: Diagnosis not present

## 2020-02-26 DIAGNOSIS — E6609 Other obesity due to excess calories: Secondary | ICD-10-CM | POA: Diagnosis not present

## 2020-02-26 DIAGNOSIS — I119 Hypertensive heart disease without heart failure: Secondary | ICD-10-CM | POA: Diagnosis not present

## 2020-02-26 LAB — CUP PACEART REMOTE DEVICE CHECK
Date Time Interrogation Session: 20210627230529
Implantable Pulse Generator Implant Date: 20210322

## 2020-02-26 NOTE — Progress Notes (Signed)
Electrophysiology TeleHealth Note   Due to national recommendations of social distancing due to COVID 19, an audio/video telehealth visit is felt to be most appropriate for this patient at this time.  See MyChart message from today for the patient's consent to telehealth for [facility].  Date:  02/26/2020   ID:  Tracy Miles, DOB [birth year], MRN [id number]  Location: patient's home  Provider location:  [place]  Evaluation Performed: Follow-up visit  PCP:  [name], MD   Electrophysiologist:  Dr Rayann Heman  Chief Complaint:  AF follow up  History of Present Illness:    Tracy Miles is a 86 y.o. female who presents via telehealth conferencing today.  Since last being seen in our clinic, the patient reports doing reasonably well. She had to take a prednisone dose pack and had her episode of AF afterwards.  Today, she denies symptoms of chest pain, shortness of breath,  lower extremity edema, dizziness, presyncope, or syncope.  The patient is otherwise without complaint today.   Past Medical History:  Diagnosis Date  . Anemia   . Aortic stenosis    Very mild  . Benign paroxysmal positional vertigo 12/17/2013  . Chicken pox as a child  . Colon polyp   . Coronary artery disease   . DDD (degenerative disc disease) 11/16/2013  . Hyperlipidemia   . Hypertension   . Iron deficiency anemia 11/13/2013  . Mumps child and teenager  . OA (osteoarthritis) 11/19/2013   S/p L TKR  . Obesity   . OSA on CPAP   . Pancreatitis    post hysterectomy  . Persistent atrial fibrillation ([place])   . Personal history of colonic polyps 02/25/2014  . Personal history of DVT (deep vein thrombosis) X 2   "left"  . Sleep apnea   . Spinal stenosis     Past Surgical History:  Procedure Laterality Date  . APPENDECTOMY    . ATRIAL FIBRILLATION ABLATION N/A 04/25/2019   Procedure: ATRIAL FIBRILLATION ABLATION;  Surgeon: [name], MD;  Location: [place] [facility];  Service:  Cardiovascular;  Laterality: N/A;  . CARDIOVERSION N/A 08/09/2015   Procedure: CARDIOVERSION;  Surgeon: Jerline Pain, MD;  Location: [place];  Service: Cardiovascular;  Laterality: N/A;  . CARDIOVERSION N/A 09/30/2018   Procedure: CARDIOVERSION;  Surgeon: [name], MD;  Location: [facility] ENDOS[facility];  Service: Cardiovascular;  Laterality: N/A;  . CARDIOVERSION N/A 02/27/2019   Procedure: CARDIOVERSION;  Surgeon: Buford Dresser, MD;  Location: [facility] ENDOSCOPY;  Service: Cardiovascular;  Laterality: N/A;  . CARDIOVERSION N/A 06/07/2019   Procedure: CARDIOVERSION;  Surgeon: [name], MD;  Location: [place];  Service: Cardiovascular;  Laterality: N/A;  . CATARACT EXTRACTION W/ INTRAOCULAR LENS  IMPLANT, BILATERAL Bilateral 2016  . CYST REMOVAL HAND Bilateral 1990's   "played to much golf"  . DILATION AND CURETTAGE OF UTERUS    . implantable loop recorder placement  11/20/2019   Medtronic Reveal Linq model LNQ 22 (model number [id number]) implanted in office by Dr Rayann Heman for afib management  . LUMBAR LAMINECTOMY  40 yrs ago   "L3-4"  . MENISCUS REPAIR Bilateral 2000 and 2010  . REPLACEMENT TOTAL KNEE Left 2013  . TEE WITHOUT CARDIOVERSION N/A 04/24/2019   Procedure: TRANSESOPHAGEAL ECHOCARDIOGRAM (TEE);  Surgeon: Acie Fredrickson Wonda Cheng, MD;  Location: [place];  Service: Cardiovascular;  Laterality: N/A;  . TONSILECTOMY, ADENOIDECTOMY, BILATERAL MYRINGOTOMY AND TUBES  child  . TOTAL ABDOMINAL HYSTERECTOMY  1995   had 2 tumors-  benign  . TUBAL LIGATION  86 years old    Current Outpatient Medications  Medication Sig Dispense Refill  . acetaminophen (TYLENOL) 325 MG tablet Take 325 mg by mouth daily.     Marland Kitchen atorvastatin (LIPITOR) 20 MG tablet TAKE 1 TABLET BY MOUTH  DAILY AT 6 PM 90 tablet 3  . Biotin 5000 MCG CAPS Take 1 capsule by mouth daily.    . bumetanide (BUMEX) 1 MG tablet TAKE ONE (1) TABLET BY MOUTH EVERY DAY 90 tablet 1  . Calcium Carb-Cholecalciferol (CALCIUM  600 + D PO) Take 1 tablet by mouth 2 (two) times daily.    . Carboxymethylcellul-Glycerin (LUBRICATING EYE DROPS OP) Place 1 drop into both eyes daily as needed (dry eyes).    Marland Kitchen diltiazem (CARDIZEM CD) 120 MG 24 hr capsule Take 1 capsule (120 mg total) by mouth daily. 60 capsule 6  . dofetilide (TIKOSYN) 250 MCG capsule Take 1 capsule (250 mcg total) by mouth 2 (two) times daily. 60 capsule 8  . ELIQUIS 5 MG TABS tablet TAKE ONE TABLET BY MOUTH TWICE DAILY 180 tablet 1  . lisinopril (ZESTRIL) 10 MG tablet Take 1 tablet (10 mg total) by mouth daily. 90 tablet 1  . loratadine (CLARITIN) 10 MG tablet Take 10 mg by mouth daily.     . metoprolol succinate (TOPROL XL) 25 MG 24 hr tablet Take 1.5 tablets (37.5 mg total) by mouth 2 (two) times daily. 270 tablet 2  . potassium chloride (KLOR-CON) 10 MEQ tablet TAKE 1 TABLET BY MOUTH IN  THE MORNING AND 2 TABLETS  IN THE EVENING 270 tablet 3  . Probiotic CAPS Take 1 capsule by mouth daily.    . psyllium (REGULOID) 0.52 g capsule Take 0.52 g by mouth 2 (two) times daily.      No current facility-administered medications for this visit.    Allergies:   Gabapentin, Lactose intolerance (gi), Zebeta [bisoprolol fumarate], Penicillins, and Sulfa antibiotics   Social History:  The patient  reports that she has never smoked. She has never used smokeless tobacco. She reports that she does not drink alcohol and does not use drugs.   ROS:  Please see the history of present illness.   All other systems are personally reviewed and negative.    Exam:    Vital Signs:  BP (!) 147/90   Pulse 67   Ht 5\' 3"  (1.6 m)   Wt 276 lb (125.2 kg)   SpO2 97%   BMI 48.89 kg/m   Well sounding and appearing, alert and conversant, regular work of breathing,  good skin color Eyes- anicteric, neuro- grossly intact, skin- no apparent rash or lesions or cyanosis, mouth- oral mucosa is pink  Labs/Other Tests and Data Reviewed:    Recent Labs: 06/02/2019: Magnesium  2.0 11/07/2019: ALT 13; BUN 18; Creatinine, Ser 0.91; Hemoglobin 13.2; Platelets 247.0; Potassium 4.4; Sodium 139; TSH 2.10   Wt Readings from Last 3 Encounters:  02/26/20 276 lb (125.2 kg)  11/20/19 278 lb (126.1 kg)  11/07/19 274 lb 3.2 oz (124.4 kg)     Last device remote is reviewed from [place] PDF which reveals normal device function    ASSESSMENT & PLAN:    1.  Persistent atrial fibrillation AF burden 3% by recent ILR interrogation Continue Tikosyn and Eliquis  2.  Hypertensive cardiovascular disease Stable No change required today  3.  Obesity Body mass index is 48.89 kg/m. Weight loss encouraged  4.  OSA Continue CPAP  Risks, benefits and potential toxicities for medications prescribed and/or refilled reviewed with patient today.   Follow-up:  3 months with AF clinic    Patient Risk:  after full review of this patients clinical status, I feel that they are at moderate risk at this time.  Today, I have spent 15 minutes with the patient with telehealth technology discussing arrhythmia management .    Army Fossa, MD  02/26/2020 11:15 AM     [facility] [address] [place] [address] [phone] (office) [phone] (fax)

## 2020-02-28 ENCOUNTER — Telehealth: Payer: Self-pay | Admitting: Family Medicine

## 2020-02-28 NOTE — Progress Notes (Signed)
  Chronic Care Management   Note  02/28/2020 Name: [name] MRN: [id number] DOB: [birth year]  [name] is a 86 y.o. year old female who is a primary care patient of [name], MD. I reached out to [facility] by phone today in response to a referral sent by Ms. Benjamine Mola Talamo's PCP, [name], MD.   [name] was given information about Chronic Care Management services today including:  1. CCM service includes personalized support from designated clinical staff supervised by her physician, including individualized plan of care and coordination with other care providers 2. 24/7 contact phone numbers for assistance for urgent and routine care needs. 3. Service will only be billed when office clinical staff spend 20 minutes or more in a month to coordinate care. 4. Only one practitioner may furnish and bill the service in a calendar month. 5. The patient may stop CCM services at any time (effective at the end of the month) by phone call to the office staff.   Patient agreed to services and verbal consent obtained.   Follow up plan:  [name]

## 2020-03-18 ENCOUNTER — Encounter: Payer: Self-pay | Admitting: Pulmonary Disease

## 2020-03-18 ENCOUNTER — Ambulatory Visit (INDEPENDENT_AMBULATORY_CARE_PROVIDER_SITE_OTHER): Payer: Medicare Other | Admitting: Pulmonary Disease

## 2020-03-18 ENCOUNTER — Other Ambulatory Visit: Payer: Self-pay

## 2020-03-18 VITALS — BP 114/64 | HR 71 | Temp 98.3°F | Ht 63.0 in | Wt 278.0 lb

## 2020-03-18 DIAGNOSIS — J849 Interstitial pulmonary disease, unspecified: Secondary | ICD-10-CM

## 2020-03-18 DIAGNOSIS — G4730 Sleep apnea, unspecified: Secondary | ICD-10-CM

## 2020-03-18 DIAGNOSIS — J984 Other disorders of lung: Secondary | ICD-10-CM | POA: Diagnosis not present

## 2020-03-18 DIAGNOSIS — T462X5A Adverse effect of other antidysrhythmic drugs, initial encounter: Secondary | ICD-10-CM

## 2020-03-18 DIAGNOSIS — E669 Obesity, unspecified: Secondary | ICD-10-CM | POA: Diagnosis not present

## 2020-03-18 DIAGNOSIS — G4733 Obstructive sleep apnea (adult) (pediatric): Secondary | ICD-10-CM | POA: Diagnosis not present

## 2020-03-18 NOTE — Progress Notes (Addendum)
[facility]  Chief Complaint  Patient presents with  . Follow-up    Wears Bipap-7 hrs. each night.Heavy PND    Constitutional:  BP 114/64 (BP Location: Left Arm, Cuff Size: Large)   Pulse 71   Temp 98.3 F (36.8 C) (Oral)   Ht 5\' 3"  (1.6 m)   Wt 278 lb (126.1 kg)   SpO2 99%   BMI 49.25 kg/m   Past Medical History:  A fib, DVT, Vertigo, Colon polyp, CAD, DDD, HLD, HTN, Anemia, OA, Pancreatitis, Spinal stenosis, Lichen planus  Summary:  Tracy Miles is a 86 y.o. female with OSA and pulmonary fibrosis.  Subjective:   Previously seen by Dr. Elsworth Soho.  Uses Bipap nightly.  Has nasal pillows.  Other mask types weren't as comfortable.  Gets sinus congestion with post nasal drip.  Especially so at night.  Uses claritin.  Denies cough, wheeze, sputum, chest congestion.  Doesn't feel like her breathing limits her activity.  Has been seen by dentist and oral surgeon for lichen planus.    Physical Exam:   Appearance - well kempt  ENMT - no sinus tenderness, no nasal discharge, white patches on tongue, Mallampati 3  Respiratory - no wheeze, or rales  CV - regular rate and rhythm, no murmurs  GI - soft, non tender  Lymph - no adenopathy noted in neck  Ext - no edema  Skin - no rashes  Neuro - normal strength, oriented x 3  Psych - normal mood and affect   Assessment/Plan:   Obstructive sleep apnea. 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 - CT chest from July 2020 showed very mild changes - PFT from July 2020 was normal - no significant respiratory symptoms - monitor clinically - defer additional testing unless she develops symptoms  Obesity. - she is aware of how her weight can impact her health  Allergic rhinitis. - continue OTC antihistamine - advised her to try nasal irrigation and then add flonase as needed if she still has  trouble  A total of  25 minutes spent addressing patient care issues on day of visit.  Follow up:  Patient Instructions  Follow up in 1 year   Signature:  Chesley Mires, MD [place] Pager: [phone] 03/18/2020, 12:50 PM  Flow Sheet     Pulmonary tests:   PFT 03/31/19 >> FEV1 1.59 (89%), FEV1% 75, TLC 4.36 (88%), DLCO 109%  Serology 04/20/19 >> ANCA, RF, ANA negative  Chest imaging:   HRCT 03/31/19 >> mild basilar predominant subpleural fibrosis with mild traction bronchiolectasis   Sleep tests:   PSG 03/07/15 >> AHI 10,4, REM AHI 74.7, SpO2 low 84%  Bipap 02/14/20 to 03/14/20 >> used on 30 of 30 nights with average 7 hrs 34 min.  Average AHI 1.4 with Bipap 16/12 cm H2O  Cardiac tests:   Echo 04/19/19 >> EF 45 to 50%  Medications:   Allergies as of 03/18/2020      Reactions   Gabapentin Swelling   Lactose Intolerance (gi) Other (See Comments)   Bothers her stomach   Zebeta [bisoprolol Fumarate] Nausea Only   Penicillins Hives, Rash   Did it involve swelling of the face/tongue/throat, SOB, or low BP? No Did it involve sudden or severe rash/hives, skin peeling, or any reaction on the inside of your mouth or nose? Yes Did you need to seek medical attention at  a hospital or doctor's office? No When did it last happen? 25+ years If all above answers are "NO", may proceed with cephalosporin use.   Sulfa Antibiotics Rash      Medication List       Accurate as of March 18, 2020 12:50 PM. If you have any questions, ask your nurse or doctor.        acetaminophen 325 MG tablet Commonly known as: TYLENOL Take 325 mg by mouth daily.   atorvastatin 20 MG tablet Commonly known as: LIPITOR TAKE 1 TABLET BY MOUTH  DAILY AT 6 PM   Biotin 5000 MCG Caps Take 1 capsule by mouth daily.   bumetanide 1 MG tablet Commonly known as: BUMEX TAKE ONE (1) TABLET BY MOUTH EVERY DAY   CALCIUM 600 + D PO Take 1 tablet by mouth 2 (two) times daily.     diltiazem 120 MG 24 hr capsule Commonly known as: CARDIZEM CD Take 1 capsule (120 mg total) by mouth daily.   dofetilide 250 MCG capsule Commonly known as: TIKOSYN Take 1 capsule (250 mcg total) by mouth 2 (two) times daily.   Eliquis 5 MG Tabs tablet Generic drug: apixaban TAKE ONE TABLET BY MOUTH TWICE DAILY   lisinopril 10 MG tablet Commonly known as: ZESTRIL Take 1 tablet (10 mg total) by mouth daily.   loratadine 10 MG tablet Commonly known as: CLARITIN Take 10 mg by mouth daily.   LUBRICATING EYE DROPS OP Place 1 drop into both eyes daily as needed (dry eyes).   metoprolol succinate 25 MG 24 hr tablet Commonly known as: Toprol XL Take 1.5 tablets (37.5 mg total) by mouth 2 (two) times daily.   potassium chloride 10 MEQ tablet Commonly known as: KLOR-CON TAKE 1 TABLET BY MOUTH IN  THE MORNING AND 2 TABLETS  IN THE EVENING   Probiotic Caps Take 1 capsule by mouth daily.   psyllium 0.52 g capsule Commonly known as: REGULOID Take 0.52 g by mouth 2 (two) times daily.       Past Surgical History:  She  has a past surgical history that includes Tonsilectomy, adenoidectomy, bilateral myringotomy and tubes (child); Lumbar laminectomy (40 yrs ago); Tubal ligation (86 years old); Total abdominal hysterectomy (1995); Replacement total knee (Left, 2013); Cyst removal hand (Bilateral, 1990's); Meniscus repair (Bilateral, 2000 and 2010); Appendectomy; Cataract extraction w/ intraocular lens  implant, bilateral (Bilateral, 2016); Dilation and curettage of uterus; Cardioversion (N/A, 08/09/2015); Cardioversion (N/A, 09/30/2018); Cardioversion (N/A, 02/27/2019); TEE without cardioversion (N/A, 04/24/2019); ATRIAL FIBRILLATION ABLATION (N/A, 04/25/2019); Cardioversion (N/A, 06/07/2019); and implantable loop recorder placement (11/20/2019).  Family History:  Her family history includes Atrial fibrillation in her son; COPD in her father; Cancer in her maternal grandfather and paternal  grandmother; Cancer (age of onset: 55) in her son; Cancer (age of onset: 91) in her father; Dementia in her mother; Diabetes in her maternal grandmother; Gout in her son and son; Heart attack (age of onset: 17) in her mother; Heart disease in her brother; Hyperlipidemia in her brother, mother, son, and son; Hypertension in her brother; Pernicious anemia in her maternal grandmother and mother; Sleep apnea in her son; Stroke in her paternal grandfather.  Social History:  She  reports that she has never smoked. She has never used smokeless tobacco. She reports that she does not drink alcohol and does not use drugs.

## 2020-03-18 NOTE — Patient Instructions (Signed)
Follow up in 1 year.

## 2020-03-19 ENCOUNTER — Other Ambulatory Visit (HOSPITAL_COMMUNITY): Payer: Self-pay | Admitting: Nurse Practitioner

## 2020-04-01 ENCOUNTER — Ambulatory Visit (INDEPENDENT_AMBULATORY_CARE_PROVIDER_SITE_OTHER): Payer: Medicare Other | Admitting: *Deleted

## 2020-04-01 DIAGNOSIS — I4819 Other persistent atrial fibrillation: Secondary | ICD-10-CM | POA: Diagnosis not present

## 2020-04-01 LAB — CUP PACEART REMOTE DEVICE CHECK
Date Time Interrogation Session: 20210730230404
Implantable Pulse Generator Implant Date: 20210322

## 2020-04-04 NOTE — Progress Notes (Signed)
Carelink Summary Report / Loop Recorder 

## 2020-04-17 ENCOUNTER — Other Ambulatory Visit: Payer: Self-pay

## 2020-04-17 ENCOUNTER — Ambulatory Visit: Payer: Medicare Other | Admitting: Pharmacist

## 2020-04-17 VITALS — BP 139/71 | HR 72 | Temp 98.6°F | Wt 278.2 lb

## 2020-04-17 DIAGNOSIS — I10 Essential (primary) hypertension: Secondary | ICD-10-CM

## 2020-04-17 DIAGNOSIS — L0291 Cutaneous abscess, unspecified: Secondary | ICD-10-CM

## 2020-04-17 DIAGNOSIS — R739 Hyperglycemia, unspecified: Secondary | ICD-10-CM

## 2020-04-17 DIAGNOSIS — E782 Mixed hyperlipidemia: Secondary | ICD-10-CM

## 2020-04-17 NOTE — Patient Instructions (Addendum)
Visit Information  Goals Addressed            This Visit's Progress   . Chronic Care Management Pharmacy Care Plan       CARE PLAN ENTRY (see longitudinal plan of care for additional care plan information)  Current Barriers:  . Chronic Disease Management support, education, and care coordination needs related to Hypertension, Hyperlipidemia, Pre-Diabetes, Afib, Heart Failure, Erosive Lichen Planus   Hypertension BP Readings from Last 3 Encounters:  04/17/20 139/71  03/18/20 114/64  02/26/20 (!) 147/90   . Pharmacist Clinical Goal(s): o Over the next 90 days, patient will work with PharmD and providers to maintain BP goal <140/90 . Current regimen:  . Lisinopril 10mg  daily . Metoprolol succinate 25mg  1.5 tabs twice daily . Diltiazem ER 120mg  daily . Interventions: o Discussed BP goal . Patient self care activities - Over the next 90 days, patient will: o Maintain hypertension medication regimen.   Hyperlipidemia Lab Results  Component Value Date/Time   LDLCALC 87 11/07/2019 02:08 PM   LDLCALC 72 04/20/2019 01:45 PM   [place] 95 10/20/2018 04:04 PM   . Pharmacist Clinical Goal(s): o Over the next 90 days, patient will work with PharmD and providers to maintain LDL goal < 100 . Current regimen:  o Atorvastatin 20mg  daily  . Interventions: o Discussed LDL goal . Patient self care activities - Over the next 90 days, patient will: o Maintain cholesterol medication regimen.   Pre-Diabetes Lab Results  Component Value Date/Time   HGBA1C 5.9 11/07/2019 02:08 PM   HGBA1C 5.7 (H) 04/20/2019 01:45 PM   . Pharmacist Clinical Goal(s): o Over the next 90 days, patient will work with PharmD and providers to maintain A1c goal <6.5% . Current regimen:  o Diet and exercise management   . Interventions: o Discussed the importance of limiting carbohydrates (30-45grams per meal) . Patient self care activities - Over the next 90 days, patient will: o Consider limiting carbs to  30-45 grams per meal o Maintain G3T <5.1%  Erosive Lichen Planus . Pharmacist Clinical Goal(s) o Over the next 90 days, patient will work with PharmD and providers to reduce symptoms associated with condition . Current regimen:  o Chief of Staff . Interventions: o Resources provided regarding condition o Consider discussion with Dr. Rayann Heman about possibility of carvedilol to replace metoprolol . Patient self care activities - Over the next 90 days, patient will: o Maintain medication regimen to manage condition  Medication management . Pharmacist Clinical Goal(s): o Over the next 90 days, patient will work with PharmD and providers to maintain optimal medication adherence . Current pharmacy: [facility] . Interventions o Comprehensive medication review performed. o Continue current medication management strategy . Patient self care activities - Over the next 90 days, patient will: o Focus on medication adherence by filling and taking medications appropriately  o Take medications as prescribed o Report any questions or concerns to PharmD and/or provider(s)  Initial goal documentation        Ms. Salinas was given information about Chronic Care Management services today including:  1. CCM service includes personalized support from designated clinical staff supervised by her physician, including individualized plan of care and coordination with other care providers 2. 24/7 contact phone numbers for assistance for urgent and routine care needs. 3. Standard insurance, coinsurance, copays and deductibles apply for chronic care management only during months in which we provide at least 20 minutes of these services. Most insurances cover these  services at 100%, however patients may be responsible for any copay, coinsurance and/or deductible if applicable. This service may help you avoid the need for more expensive face-to-face services. 4. Only one practitioner may furnish  and bill the service in a calendar month. 5. The patient may stop CCM services at any time (effective at the end of the month) by phone call to the office staff.  Patient agreed to services and verbal consent obtained.   The patient verbalized understanding of instructions provided today and agreed to receive a mailed copy of patient instruction and/or educational materials. Telephone follow up appointment with pharmacy team member scheduled for: 07/22/2020  Melvenia Beam Stony Stegmann, PharmD Clinical Pharmacist [facility] [phone]

## 2020-04-17 NOTE — Chronic Care Management (AMB) (Signed)
Chronic Care Management Pharmacy  Name: Tracy Miles  MRN: [id number] DOB: Feb 29, 1934  Chief Complaint/ HPI  Tracy Miles,  86 y.o. , female presents for their Initial CCM visit with the clinical pharmacist In office.  PCP : Bradd Canary, MD  Their chronic conditions include: Hypertension, Hyperlipidemia, Pre-Diabetes, Afib, Heart Failure, Erosive Lichen Planus  Office Visits: 11/07/19: Visit w/ Dr. Abner Greenspan -  Pt following closely with cardio d/t uncontrolled Afib. Has not tolerated amiodarone and has had cardiac ablation and cardioversion. No med changes noted.  Consult Visit: 03/18/20: Pulmonary visit w/ Dr. Craige Cotta - No med changes noted.   02/26/20: Cardio visit w/ Dr. Johney Frame - AF burden 3% by recent ILR interrogation. No med changes noted.  11/28/19: Cardio visit w/ Maxine Glenn, PA-C -  ILR wound check. Wound well healed.   11/20/19: Cardio visit w/ Dr. Johney Frame - Palpitations. Advised ILR for further evaluation. Decision to place ILR (Medtronic Reveal [facility]). No complications.  Medications: Outpatient Encounter Medications as of 04/17/2020  Medication Sig  . acetaminophen (TYLENOL) 325 MG tablet Take 325 mg by mouth daily.   Marland Kitchen atorvastatin (LIPITOR) 20 MG tablet TAKE 1 TABLET BY MOUTH  DAILY AT 6 PM  . Biotin 5000 MCG CAPS Take 1 capsule by mouth daily.  . bumetanide (BUMEX) 1 MG tablet TAKE ONE (1) TABLET BY MOUTH EVERY Jayshawn Colston  . Calcium Carb-Cholecalciferol (CALCIUM 600 + D PO) Take 1 tablet by mouth 2 (two) times daily.  . Carboxymethylcellul-Glycerin (LUBRICATING EYE DROPS OP) Place 1 drop into both eyes daily as needed (dry eyes).  Marland Kitchen diltiazem (CARDIZEM CD) 120 MG 24 hr capsule Take 1 capsule (120 mg total) by mouth daily.  Marland Kitchen dofetilide (TIKOSYN) 250 MCG capsule Take 1 capsule (250 mcg total) by mouth 2 (two) times daily.  Marland Kitchen ELIQUIS 5 MG TABS tablet TAKE ONE TABLET BY MOUTH TWICE DAILY  . lisinopril (ZESTRIL) 10 MG tablet Take 1 tablet (10 mg total) by mouth daily.  Marland Kitchen  loratadine (CLARITIN) 10 MG tablet Take 10 mg by mouth daily.   . metoprolol succinate (TOPROL-XL) 25 MG 24 hr tablet TAKE 1 AND 1/2 TABLETS BY MOUTH TWICE DAILY  . potassium chloride (KLOR-CON) 10 MEQ tablet TAKE 1 TABLET BY MOUTH IN  THE MORNING AND 2 TABLETS  IN THE EVENING  . Probiotic CAPS Take 1 capsule by mouth daily.  . psyllium (REGULOID) 0.52 g capsule Take 0.52 g by mouth 2 (two) times daily.    No facility-administered encounter medications on file as of 04/17/2020.   SDOH Screenings   Alcohol Screen:   . Last Alcohol Screening Score (AUDIT):   Depression (PHQ2-9): Low Risk   . PHQ-2 Score: 0  Financial Resource Strain: Low Risk   . Difficulty of Paying Living Expenses: Not hard at all  Food Insecurity:   . Worried About Programme researcher, broadcasting/film/video in the Last Year:   . [employer] of Food in the Last Year:   Housing:   . Last Housing Risk Score:   Physical Activity:   . Days of Exercise per Week:   . Minutes of Exercise per Session:   Social Connections:   . Frequency of Communication with Friends and Family:   . Frequency of Social Gatherings with Friends and Family:   . Attends Religious Services:   . Active Member of Clubs or Organizations:   . Attends Banker Meetings:   Marland Kitchen Marital Status:   Stress:   . Feeling of Stress :  Tobacco Use: Low Risk   . Smoking Tobacco Use: Never Smoker  . Smokeless Tobacco Use: Never Used  Transportation Needs:   . Film/video editor (Medical):   Marland Kitchen Lack of Transportation (Non-Medical):      Current Diagnosis/Assessment:  Goals Addressed            This Visit's Progress   . Chronic Care Management Pharmacy Care Plan       CARE PLAN ENTRY (see longitudinal plan of care for additional care plan information)  Current Barriers:  . Chronic Disease Management support, education, and care coordination needs related to Hypertension, Hyperlipidemia, Pre-Diabetes, Afib, Heart Failure, Erosive Lichen Planus    Hypertension BP Readings from Last 3 Encounters:  04/17/20 139/71  03/18/20 114/64  02/26/20 (!) 147/90   . Pharmacist Clinical Goal(s): o Over the next 90 days, patient will work with PharmD and providers to maintain BP goal <140/90 . Current regimen:  . Lisinopril 10mg  daily . Metoprolol succinate 25mg  1.5 tabs twice daily . Diltiazem ER 120mg  daily . Interventions: o Discussed BP goal . Patient self care activities - Over the next 90 days, patient will: o Maintain hypertension medication regimen.   Hyperlipidemia Lab Results  Component Value Date/Time   LDLCALC 87 11/07/2019 02:08 PM   LDLCALC 72 04/20/2019 01:45 PM   Benzie 95 10/20/2018 04:04 PM   . Pharmacist Clinical Goal(s): o Over the next 90 days, patient will work with PharmD and providers to maintain LDL goal < 100 . Current regimen:  o Atorvastatin 20mg  daily  . Interventions: o Discussed LDL goal . Patient self care activities - Over the next 90 days, patient will: o Maintain cholesterol medication regimen.   Pre-Diabetes Lab Results  Component Value Date/Time   HGBA1C 5.9 11/07/2019 02:08 PM   HGBA1C 5.7 (H) 04/20/2019 01:45 PM   . Pharmacist Clinical Goal(s): o Over the next 90 days, patient will work with PharmD and providers to maintain A1c goal <6.5% . Current regimen:  o Diet and exercise management   . Interventions: o Discussed the importance of limiting carbohydrates (30-45grams per meal) . Patient self care activities - Over the next 90 days, patient will: o Consider limiting carbs to 30-45 grams per meal o Maintain W0J <8.1%  Erosive Lichen Planus . Pharmacist Clinical Goal(s) o Over the next 90 days, patient will work with PharmD and providers to reduce symptoms associated with condition . Current regimen:  o Chief of Staff . Interventions: o Resources provided regarding condition o Consider discussion with Dr. Rayann Heman about possibility of carvedilol to replace  metoprolol . Patient self care activities - Over the next 90 days, patient will: o Maintain medication regimen to manage condition  Medication management . Pharmacist Clinical Goal(s): o Over the next 90 days, patient will work with PharmD and providers to maintain optimal medication adherence . Current pharmacy: [facility] . Interventions o Comprehensive medication review performed. o Continue current medication management strategy . Patient self care activities - Over the next 90 days, patient will: o Focus on medication adherence by filling and taking medications appropriately  o Take medications as prescribed o Report any questions or concerns to PharmD and/or provider(s)  Initial goal documentation         Hypertension   BP goal is:  <140/90  Office blood pressures are  BP Readings from Last 3 Encounters:  04/17/20 139/71  03/18/20 114/64  02/26/20 (!) 147/90   Patient checks BP at home infrequently  Patient home BP readings are ranging: Unable to assess  Patient has failed these meds in the past: None noted  Patient is currently controlled on the following medications:  . Lisinopril 62m daily (has taken "forever") . Metoprolol succinate 255m1.5 tabs twice daily . Diltiazem ER 12065maily  We discussed BP goal  Plan -Continue current medications     Hyperlipidemia   LDL goal <100  Lipid Panel     Component Value Date/Time   CHOL 161 11/07/2019 1408   CHOL 138 04/20/2019 1345   TRIG 138.0 11/07/2019 1408   HDL 46.30 11/07/2019 1408   HDL 41 04/20/2019 1345   LDLCALC 87 11/07/2019 1408   LDLCALC 72 04/20/2019 1345   LDLCALC 95 10/20/2018 1604    Hepatic Function Latest Ref Rng & Units 11/07/2019 04/20/2019 10/20/2018  Total Protein 6.0 - 8.3 g/dL 7.0 6.8 7.2  Albumin 3.5 - 5.2 g/dL 4.2 4.5 -  AST 0 - 37 U/L _0 ALT 0 - 35 U/L _1 Alk Phosphatase 39 - 117 U/L 116 99 -  Total Bilirubin 0.2 - 1.2 mg/dL 0.7 1.0 0.6   Bilirubin, Direct 0.0 - 0.3 mg/dL - - -     The ASCVD Risk score (GoMikey Bussing Jr., et al., 2013) failed to calculate for the following reasons:   The 2013 ASCVD risk score is only valid for ages 40 15 79 83Patient has failed these meds in past: None noted  Patient is currently controlled on the following medications:  . Atorvastatin 90m32mily   We discussed:  LDL goal  Plan -Continue current medications  Pre-Diabetes   A1c goal <6.5%  Recent Relevant Labs: Lab Results  Component Value Date/Time   HGBA1C 5.9 11/07/2019 02:08 PM   HGBA1C 5.7 (H) 04/20/2019 01:45 PM   GFR 58.96 (L) 11/07/2019 02:08 PM   GFR 55.77 (L) 04/05/2018 01:57 PM    Patient has failed these meds in past: None noted  Patient is currently controlled on the following medications: . None  We discussed: diet and exercise extensively   Exercise Walks in heated pool 3 times per week at [employer] fiber diet. "I love salads"  Discussed: consideration of carbohydrate limitation to 30-45 grams per meal and goal to keep a1c <6.5%  Plan -Continue control with diet and exercise  -Consider limiting carbohydrates to 30-45 grams per meal  AFIB   Patient is currently rate and rhythm controlled.  Patient has failed these meds in past: amiodarone (lung toxicity), verapamil (D/C when tikosyn started), Xarelto (anemia per pt), warfarin (labile INR) Patient is currently controlled on the following medications:   Diltiazem ER 190mg52mly  Dofetilide 250mcg39mce daily  Eliquis 5mg tw65m daily  Metoprolol succinate 25mg 1.76mbs twice daily  Has had significant difficulty with afib. Discussed indication for each medication and maintaining her current stability. Although patient would like to take fewer medications she realizes the importance of her current regimen.  States she avoids caffeine and alcohol.   Plan -Continue current medications   Heart Failure   Type:  Systolic  Last ejection fraction: 04/19/2019 EF 45-50% NYHA Class: II (slight limitation of activity) more likely Afib related AHA HF Stage: C (Heart disease and symptoms present)  Patient has failed these meds in past: None noted  Patient is currently controlled on the following medications:  Bumetanide 1mg dail19mPotassium Chliride 10mEq 1 t18mM and 2 tab  PM  Metoprolol succinate 25mg  1.5 tabs twice daily (has only been taking for a year)  She notes slight edema today. She states R ankle swells more than left, but today's level is at baseline. Denies any significant SOB. States she weighs daily.  We discussed weighing daily; if you gain more than 3 pounds in one Zilla Shartzer or 5 pounds in one week call your doctor  Plan -Continue current medications   Erosive Lichen Planus    Patient has failed these meds in past: None noted  Patient is currently uncontrolled on the following medications: Marland Kitchen Magic Mouth Wash occassionally  Was told by oral surgeon that patient had this diagnosis. States that magic mouth wash does not help much. She has had ulcers off an on all her life, but it has become more significant May 2021. Can't recall anything new that has happened since May. Wonders if medications could be contributing.  States she provided her medication list to oral surgeon who did not report that any of her medications would be the cause. Will explore medication list and other probable causes of erosive lichen planus.   She wonders if she has a metal allergy.   Only new med change was increase dose in metoprolol in June. Went to dentist on 5/21 for cleaning, then 6/9 for partial repair when she broke the partial in her mouth. She wonders if any food she eats could be contributing.  Update After Visit Researched after visit and discovered that ACEI (captopril, enalapril) and beta blockers (propranolol, labetalol, sotalol, metoprolol) can cause lichenoid eruptions (drug-induced lichen  planus). The lag/latency period ranges from one month to two years. Noting that patient started metoprolol last year, and she had recent dose increase, it is probable that metoprolol could be cause. Called patient back to discuss my findings. Encouraged patient to discuss this with Dr. Rayann Heman. Could consider changing metoprolol to carvedilol as no significant findings initially found implicating carvedilol with caused erosive lichen planus.  Carvedilol  Pros: good for HR, BP, HF Cons: Not B1 selective and could affect B2 and therefore effect lung  Plan -Discuss possibility of metoprolol causing erosive lichen planus (could consider changing to carvedilol)  Osteopenia Screening   Last DEXA Scan: 09/13/2015  T-Score femoral neck: -0.8  T-Score lumbar spine: -0.2  No results found for: VD25OH   Patient is not a candidate for pharmacologic treatment  Patient has failed these meds in past: None noted  Patient is currently controlled on the following medications:  . Calcium/Vitamin D (unsure of dosage)  Plan -Continue current medications  Vaccines/Health Maintenance   Reviewed and discussed patient's vaccination history.  Patient up to date on all vaccines.  Immunization History  Administered Date(s) Administered  . Fluad Quad(high Dose 65+) 06/16/2019  . Influenza, High Dose Seasonal PF 05/11/2017, 05/24/2018  . Influenza,inj,Quad PF,6+ Mos 04/30/2016  . Influenza-Unspecified 05/31/2013, 05/01/2014, 06/11/2015  . Moderna SARS-COVID-2 Vaccination 09/13/2019, 10/10/2019  . Pneumococcal Conjugate-13 04/02/2014  . Pneumococcal Polysaccharide-23 08/31/1998, 09/06/2015  . Tdap 07/31/2012  . Zoster Recombinat (Shingrix) 04/19/2018, 06/27/2018   Patient makes note that she would like to refuse any further colonoscopy. She states she does not have a family history and eats a high fiber diet.  Noting pt's age, agree with this decision.   Miscellaneous Meds Acetaminophen 325mg   Biotin  5044mcg  Loratadine 10mg  Probiotic Psyllium 0.52g  Lubricating eye drops  Medication Management   Pt uses [facility] and [facility] for all medications Uses pill box? Yes  Pt endorses 100% compliance  We discussed: Option of [employer]. Pt likes that [facility] delivers to [employer]. Assured that [employer] could as well including the ability to provide medication synchronization and adherence packaging. Pt likes her current system. She does make note of copays at [facility]. States she hits the donut hole around June each year. Can inquire about [employer] possible copays noting she does also fill some meds through mail order and this would probably be the most cost efficient for patient.   Plan -Continue current medication management strategy  Follow up: 3 month phone visit

## 2020-04-23 ENCOUNTER — Other Ambulatory Visit: Payer: Self-pay | Admitting: *Deleted

## 2020-04-23 DIAGNOSIS — I10 Essential (primary) hypertension: Secondary | ICD-10-CM

## 2020-04-23 DIAGNOSIS — E782 Mixed hyperlipidemia: Secondary | ICD-10-CM

## 2020-05-01 ENCOUNTER — Telehealth: Payer: Self-pay | Admitting: Student

## 2020-05-01 NOTE — Telephone Encounter (Addendum)
Patient reports she was aware that she went into AF yesterday morning. She reports feeling tired with occasional dizziness that lasts only a few seconds. No CP, chest pressure or syncope. She reports that she increases her Toprol xl dose from 37.5 mg BID to 50 mg BID 04/30/20 and has felt better.+ Eliquis. Patient made aware that her AF Clinic visit will be scheduled sooner due to symptomatic AF per [facility] PA. ED precautions given. Will forward to AF Clinic and patient expects call .

## 2020-05-01 NOTE — Telephone Encounter (Signed)
Called and spoke with patient, appt scheduled for 05/02/20 at 1:30 pm with [name], NP.

## 2020-05-01 NOTE — Telephone Encounter (Signed)
Pt alert with AF starting in the past day or so with occasional RVR.   Called to assess symptoms. No answer -> straight to voicemail. LMOM for CB.   Called son to ask if he would try to get in touch with her to have her call us.   If asymptomatic, can schedule for remote report on Monday to see if she has gone back into Sinus, as has been paroxysmal in the past.   If symptomatic, would move up her AF appointment.   Legrand Como [address] [place], PA-C  05/01/2020 9:44 AM

## 2020-05-02 ENCOUNTER — Ambulatory Visit (INDEPENDENT_AMBULATORY_CARE_PROVIDER_SITE_OTHER): Payer: Medicare Other | Admitting: *Deleted

## 2020-05-02 ENCOUNTER — Encounter (HOSPITAL_COMMUNITY): Payer: Self-pay | Admitting: Nurse Practitioner

## 2020-05-02 ENCOUNTER — Ambulatory Visit (HOSPITAL_COMMUNITY)
Admission: RE | Admit: 2020-05-02 | Discharge: 2020-05-02 | Disposition: A | Discharge status: Home / Self Care | Payer: Medicare Other | Source: Ambulatory Visit | Attending: Nurse Practitioner | Admitting: Nurse Practitioner

## 2020-05-02 ENCOUNTER — Other Ambulatory Visit: Payer: Self-pay

## 2020-05-02 VITALS — BP 146/74 | HR 124 | Ht 63.0 in | Wt 278.6 lb

## 2020-05-02 DIAGNOSIS — I2510 Atherosclerotic heart disease of native coronary artery without angina pectoris: Secondary | ICD-10-CM | POA: Insufficient documentation

## 2020-05-02 DIAGNOSIS — Z86718 Personal history of other venous thrombosis and embolism: Secondary | ICD-10-CM | POA: Insufficient documentation

## 2020-05-02 DIAGNOSIS — E669 Obesity, unspecified: Secondary | ICD-10-CM | POA: Diagnosis not present

## 2020-05-02 DIAGNOSIS — D6869 Other thrombophilia: Secondary | ICD-10-CM | POA: Diagnosis not present

## 2020-05-02 DIAGNOSIS — Z9049 Acquired absence of other specified parts of digestive tract: Secondary | ICD-10-CM | POA: Insufficient documentation

## 2020-05-02 DIAGNOSIS — Z90710 Acquired absence of both cervix and uterus: Secondary | ICD-10-CM | POA: Insufficient documentation

## 2020-05-02 DIAGNOSIS — I4892 Unspecified atrial flutter: Secondary | ICD-10-CM | POA: Diagnosis not present

## 2020-05-02 DIAGNOSIS — I350 Nonrheumatic aortic (valve) stenosis: Secondary | ICD-10-CM | POA: Insufficient documentation

## 2020-05-02 DIAGNOSIS — I10 Essential (primary) hypertension: Secondary | ICD-10-CM | POA: Insufficient documentation

## 2020-05-02 DIAGNOSIS — G4733 Obstructive sleep apnea (adult) (pediatric): Secondary | ICD-10-CM | POA: Diagnosis not present

## 2020-05-02 DIAGNOSIS — M1990 Unspecified osteoarthritis, unspecified site: Secondary | ICD-10-CM | POA: Insufficient documentation

## 2020-05-02 DIAGNOSIS — Z8719 Personal history of other diseases of the digestive system: Secondary | ICD-10-CM | POA: Insufficient documentation

## 2020-05-02 DIAGNOSIS — Z79899 Other long term (current) drug therapy: Secondary | ICD-10-CM | POA: Insufficient documentation

## 2020-05-02 DIAGNOSIS — I4819 Other persistent atrial fibrillation: Secondary | ICD-10-CM

## 2020-05-02 DIAGNOSIS — Z9989 Dependence on other enabling machines and devices: Secondary | ICD-10-CM | POA: Insufficient documentation

## 2020-05-02 DIAGNOSIS — E785 Hyperlipidemia, unspecified: Secondary | ICD-10-CM | POA: Diagnosis not present

## 2020-05-02 DIAGNOSIS — Z96652 Presence of left artificial knee joint: Secondary | ICD-10-CM | POA: Diagnosis not present

## 2020-05-02 DIAGNOSIS — Z7901 Long term (current) use of anticoagulants: Secondary | ICD-10-CM | POA: Insufficient documentation

## 2020-05-02 LAB — CBC
HCT: 41.6 % (ref 36.0–46.0)
Hemoglobin: 13.4 g/dL (ref 12.0–15.0)
MCH: 31.2 pg (ref 26.0–34.0)
MCHC: 32.2 g/dL (ref 30.0–36.0)
MCV: 96.7 fL (ref 80.0–100.0)
Platelets: 248 10*3/uL (ref 150–400)
RBC: 4.3 MIL/uL (ref 3.87–5.11)
RDW: 12.5 % (ref 11.5–15.5)
WBC: 6.8 10*3/uL (ref 4.0–10.5)
nRBC: 0 % (ref 0.0–0.2)

## 2020-05-02 LAB — CUP PACEART REMOTE DEVICE CHECK
Date Time Interrogation Session: 20210901230642
Implantable Pulse Generator Implant Date: 20210322

## 2020-05-02 LAB — BASIC METABOLIC PANEL
Anion gap: 12 (ref 5–15)
BUN: 20 mg/dL (ref 8–23)
CO2: 25 mmol/L (ref 22–32)
Calcium: 9.4 mg/dL (ref 8.9–10.3)
Chloride: 101 mmol/L (ref 98–111)
Creatinine, Ser: 1.2 mg/dL — ABNORMAL HIGH (ref 0.44–1.00)
GFR calc Af Amer: 48 mL/min — ABNORMAL LOW (ref >60)
GFR calc non Af Amer: 41 mL/min — ABNORMAL LOW (ref >60)
Glucose, Bld: 136 mg/dL — ABNORMAL HIGH (ref 70–99)
Potassium: 4.7 mmol/L (ref 3.5–5.1)
Sodium: 138 mmol/L (ref 135–145)

## 2020-05-02 LAB — MAGNESIUM: Magnesium: 1.9 mg/dL (ref 1.7–2.4)

## 2020-05-02 NOTE — Progress Notes (Signed)
PCP: [name], MD  [name] is a 86 y.o. female who presents today for  electrophysiology follow up afib ablation 6 weeks ago. She had developed medicine refractory atrial fibrillation. She had  hematoma of the abdomen and both groins that gradually cleared. She continued  on tikosyn.  She has had intermittent afib since the procedure and was in afib  at one month f/u. She continued to watch her heart rates for the last couple of weeks and is consistently out of rhythm. She has been able to return to exercising in the pool and this has made her feel better. Her v rates are quite elevated in the 120's. Higher does of BB in the past have not helped and increased dose of diltiazem increases her swelling.  No swallowing issues since the procedure.   F/u in afib clinic,10/15, she is s/p successful cardioversion and remains in SR. She is very happy about this and is feeling much improved.   F/u in afib clinic, 05/02/20. She has received a Linq since I saw her last, and the device clinic let her know yesterday that she was out of rhythm. EKG today shows atrial flutter at 124 ms. She continues on eliquis and tikosyn. CHA2DS2VASc score is  6.No trigger that pt is aware of. She did increase metoprolol to 50 mg bid.   Today, she denies symptoms of palpitations, chest pain,   lower extremity edema, dizziness, presyncope, or syncope.  The patient is otherwise without complaint today.   Past Medical History:  Diagnosis Date  . Anemia   . Aortic stenosis    Very mild  . Benign paroxysmal positional vertigo 12/17/2013  . Chicken pox as a child  . Colon polyp   . Coronary artery disease   . DDD (degenerative disc disease) 11/16/2013  . Hyperlipidemia   . Hypertension   . Iron deficiency anemia 11/13/2013  . Mumps child and teenager  . OA (osteoarthritis) 11/19/2013   S/p L TKR  . Obesity   . OSA on CPAP   . Pancreatitis    post hysterectomy  . Persistent atrial  fibrillation ([place])   . Personal history of colonic polyps 02/25/2014  . Personal history of DVT (deep vein thrombosis) X 2   "left"  . Sleep apnea   . Spinal stenosis    Past Surgical History:  Procedure Laterality Date  . APPENDECTOMY    . ATRIAL FIBRILLATION ABLATION N/A 04/25/2019   Procedure: ATRIAL FIBRILLATION ABLATION;  Surgeon: [name], MD;  Location: [facility];  Service: Cardiovascular;  Laterality: N/A;  . CARDIOVERSION N/A 08/09/2015   Procedure: CARDIOVERSION;  Surgeon: Jerline Pain, MD;  Location: [facility];  Service: Cardiovascular;  Laterality: N/A;  . CARDIOVERSION N/A 09/30/2018   Procedure: CARDIOVERSION;  Surgeon: [name], MD;  Location[facility] ENDOSCOPY;  Service: Cardiovascular;  Laterality: N/A;  . CARDIOVERSION N/A 02/27/2019   Procedure: CARDIOVERSION;  Surgeon: Buford Dresser, MD;  Location[facility];  Service: Cardiovascular;  Laterality: N/A;  . CARDIOVERSION N/A 06/07/2019   Procedure: CARDIOVERSION;  Surgeon: [name], MD;  Location: [facility];  Service: Cardiovascular;  Laterality: N/A;  . CATARACT EXTRACTION W/ INTRAOCULAR LENS  IMPLANT, BILATERAL Bilateral 2016  . CYST REMOVAL HAND Bilateral 1990's   "played to much golf"  . DILATION AND CURETTAGE OF UTERUS    . implantable loop recorder placement  11/20/2019   Medtronic Reveal Linq model LNQ 22 (model number [id number])  implanted in office by Dr Rayann Heman for afib management  . LUMBAR LAMINECTOMY  40 yrs ago   "L3-4"  . MENISCUS REPAIR Bilateral 2000 and 2010  . REPLACEMENT TOTAL KNEE Left 2013  . TEE WITHOUT CARDIOVERSION N/A 04/24/2019   Procedure: TRANSESOPHAGEAL ECHOCARDIOGRAM (TEE);  Surgeon: Acie Fredrickson Wonda Cheng, MD;  Location: [facility];  Service: Cardiovascular;  Laterality: N/A;  . TONSILECTOMY, ADENOIDECTOMY, BILATERAL MYRINGOTOMY AND TUBES  child  . TOTAL ABDOMINAL HYSTERECTOMY  1995   had 2 tumors- benign  . TUBAL LIGATION  86 years old    ROS-  all systems are reviewed and negatives except as per HPI above  Current Outpatient Medications  Medication Sig Dispense Refill  . acetaminophen (TYLENOL) 325 MG tablet Take 325 mg by mouth daily.     Marland Kitchen atorvastatin (LIPITOR) 20 MG tablet TAKE 1 TABLET BY MOUTH  DAILY AT 6 PM 90 tablet 3  . Biotin 5000 MCG CAPS Take 1 capsule by mouth daily.    . bumetanide (BUMEX) 1 MG tablet TAKE ONE (1) TABLET BY MOUTH EVERY DAY 90 tablet 1  . Calcium Carb-Cholecalciferol (CALCIUM 600 + D PO) Take 1 tablet by mouth 2 (two) times daily.    . Carboxymethylcellul-Glycerin (LUBRICATING EYE DROPS OP) Place 1 drop into both eyes daily as needed (dry eyes).    Marland Kitchen diltiazem (CARDIZEM CD) 120 MG 24 hr capsule Take 1 capsule (120 mg total) by mouth daily. 60 capsule 6  . dofetilide (TIKOSYN) 250 MCG capsule Take 1 capsule (250 mcg total) by mouth 2 (two) times daily. 60 capsule 8  . ELIQUIS 5 MG TABS tablet TAKE ONE TABLET BY MOUTH TWICE DAILY 180 tablet 1  . lisinopril (ZESTRIL) 10 MG tablet Take 1 tablet (10 mg total) by mouth daily. 90 tablet 1  . loratadine (CLARITIN) 10 MG tablet Take 10 mg by mouth daily.     . metoprolol succinate (TOPROL-XL) 25 MG 24 hr tablet TAKE 1 AND 1/2 TABLETS BY MOUTH TWICE DAILY 270 tablet 2  . potassium chloride (KLOR-CON) 10 MEQ tablet TAKE 1 TABLET BY MOUTH IN  THE MORNING AND 2 TABLETS  IN THE EVENING 270 tablet 3  . Probiotic CAPS Take 1 capsule by mouth daily.    . psyllium (REGULOID) 0.52 g capsule Take 0.52 g by mouth 2 (two) times daily.      No current facility-administered medications for this encounter.    Physical Exam: Vitals:   05/02/20 1329  Weight: 126.4 kg  Height: 5\' 3"  (1.6 m)    GEN- The patient is well appearing, alert and oriented x 3 today.   Head- normocephalic, atraumatic Eyes-  Sclera clear, conjunctiva pink Ears- hearing intact Oropharynx- clear Lungs- Clear to ausculation bilaterally, normal work of breathing Heart- tachycardic irregular  rhythm GI- soft, NT, ND, + BS Extremities- no clubbing, cyanosis, or edema  Wt Readings from Last 3 Encounters:  05/02/20 126.4 kg  04/17/20 126.2 kg  03/18/20 126.1 kg    EKG -atrial flutter with variable rate at 124 bpm   Recent echo is also reviewed  Assessment and Plan:  1. Persistent atrial fibrillation/flutter  S/p afib ablation 04/25/19 Required CV right after ablation but since then has enjoyed SR.  Continue Tikosyn/diltaizem /metorpolol  Reduce  metoprolol back to25 mg bid am of cardioversion  No missed doses of anticoagulation with CHA2DS2VASc score of at least 6 Has had both covid shots  Cbc/bmet/mag  2.OSA She reports compliance with CPAP  3. HTN Stable  F/u  with afib clinic one week after cardioversion    Butch Penny C. Carnelius Hammitt, [facility] [address] [place], [place] [address] [phone]

## 2020-05-02 NOTE — Patient Instructions (Signed)
Cardioversion scheduled for Friday, September 10th  - Arrive at the [facility] and go to admitting at [facility] not eat or drink anything after midnight the night prior to your procedure.  - Take all your morning medication (except diabetic medications) with a sip of water prior to arrival.  - You will not be able to drive home after your procedure.  - Do NOT miss any doses of your blood thinner - if you should miss a dose please notify our office immediately.  GO back to normal dosing of metoprolol after cardioversion

## 2020-05-03 NOTE — Progress Notes (Signed)
Carelink Summary Report / Loop Recorder 

## 2020-05-08 ENCOUNTER — Other Ambulatory Visit (HOSPITAL_COMMUNITY): Payer: Self-pay | Admitting: *Deleted

## 2020-05-08 ENCOUNTER — Telehealth (HOSPITAL_COMMUNITY): Payer: Self-pay | Admitting: *Deleted

## 2020-05-08 ENCOUNTER — Other Ambulatory Visit (HOSPITAL_COMMUNITY): Payer: Medicare Other

## 2020-05-08 NOTE — Telephone Encounter (Signed)
Pt called in to report she felt back in NSR - confirmed via linq transmission return to NSR yesterday. Pt feeling much improved - HR in the 70s. Cardioversion canceled.

## 2020-05-10 ENCOUNTER — Encounter (HOSPITAL_COMMUNITY): Admission: RE | Payer: Self-pay | Source: Home / Self Care

## 2020-05-10 ENCOUNTER — Ambulatory Visit (HOSPITAL_COMMUNITY): Admission: RE | Admit: 2020-05-10 | Payer: Medicare Other | Source: Home / Self Care | Admitting: Cardiology

## 2020-05-10 SURGERY — CARDIOVERSION
Anesthesia: General

## 2020-05-13 NOTE — Telephone Encounter (Signed)
Per LINQ alert received 05/12/20 at 03:23, pt back in AF since 05/11/20 at ~08:43.   Spoke with pt to advise of findings.  Explained that today's transmission data is not yet available.  Pt reports she can tell she is still in AF.  Reports SBP 90s-130s, HR variable per pulse ox.  Started back on increased Toprol XL 50mg  BID on Saturday afternoon (had previously transitioned back to 37.5mg  BID with return to SR).  Reports her symptoms include increased fatigue, trouble walking any distance due to SOB.  Denies any chest pain or SOB at rest.  V rates elevated during AF per histograms.  Upcoming appointment in AF Clinic noted on 05/17/20.  Advised will route message to D. Kayleen Memos, NP, for any recommendations in the interim.  ED precautions reviewed.  Pt agreeable to plan.

## 2020-05-14 ENCOUNTER — Encounter: Payer: Self-pay | Admitting: Family Medicine

## 2020-05-14 ENCOUNTER — Ambulatory Visit (INDEPENDENT_AMBULATORY_CARE_PROVIDER_SITE_OTHER): Payer: Medicare Other | Admitting: Family Medicine

## 2020-05-14 ENCOUNTER — Other Ambulatory Visit: Payer: Self-pay

## 2020-05-14 VITALS — BP 118/72 | HR 91 | Temp 98.5°F | Resp 13 | Ht 63.0 in | Wt 275.8 lb

## 2020-05-14 DIAGNOSIS — I10 Essential (primary) hypertension: Secondary | ICD-10-CM

## 2020-05-14 DIAGNOSIS — Z8719 Personal history of other diseases of the digestive system: Secondary | ICD-10-CM

## 2020-05-14 DIAGNOSIS — G4733 Obstructive sleep apnea (adult) (pediatric): Secondary | ICD-10-CM | POA: Diagnosis not present

## 2020-05-14 DIAGNOSIS — I480 Paroxysmal atrial fibrillation: Secondary | ICD-10-CM

## 2020-05-14 DIAGNOSIS — R739 Hyperglycemia, unspecified: Secondary | ICD-10-CM

## 2020-05-14 DIAGNOSIS — E782 Mixed hyperlipidemia: Secondary | ICD-10-CM | POA: Diagnosis not present

## 2020-05-14 MED ORDER — APIXABAN 5 MG PO TABS
5.0000 mg | ORAL_TABLET | Freq: Two times a day (BID) | ORAL | 1 refills | Status: DC
Start: 2020-05-14 — End: 2020-09-12

## 2020-05-14 MED ORDER — BUMETANIDE 1 MG PO TABS
1.0000 mg | ORAL_TABLET | Freq: Every day | ORAL | 1 refills | Status: DC
Start: 2020-05-14 — End: 2020-11-14

## 2020-05-14 MED ORDER — LISINOPRIL 10 MG PO TABS
10.0000 mg | ORAL_TABLET | Freq: Every day | ORAL | 1 refills | Status: DC
Start: 2020-05-14 — End: 2020-11-14

## 2020-05-14 NOTE — Assessment & Plan Note (Signed)
Well controlled, no changes to meds. Encouraged heart healthy diet such as the DASH diet and exercise as tolerated.  °

## 2020-05-14 NOTE — Patient Instructions (Signed)
High quality multivitamin with minerals such as Centrum, One a Day or [place] Oral Mucositis Oral mucositis is a mouth condition that may develop as a result of treatments for cancer. Sores may appear on the lips, gums, tongue, throat, and the top (roof) or bottom (floor) of the mouth. What are the causes? This condition is caused when cancer treatments damage the lining of the mouth. This condition can happen to anyone who is being treated with cancer therapies, including:  Cancer medicines (chemotherapy) or targeted therapy.  Radiation therapy.  Bone marrow transplants and stem cell transplants. Oral mucositis is not caused by infection. However, the sores can become infected after they form. Infection can make oral mucositis worse. What increases the risk? The following factors may make you more likely to develop this condition:  Having cancers that primarily affect the blood, head, or neck.  Receiving radiation therapy alone, or in combination with chemotherapy, to the head and neck region.  Undergoing high dose chemotherapy alone or as part of bone marrow or stem cell transplant. In addition, there are other risks, including:  Having poor oral hygiene, dental problems or oral diseases.  Wearing dentures that do not fit correctly.  Having other medical conditions, such as diabetes, HIV, AIDS, or kidney disease.  Using products that contain nicotine or tobacco, such as cigarettes, chewing tobacco, and e-cigarettes.  Not drinking enough clear fluids.  Drinking alcohol. What are the signs or symptoms? Symptoms of this condition include:  Mouth sores. These sores may bleed.  Color changes inside the mouth. Red, shiny areas may appear.  White patches or pus in the mouth.  Pain in the mouth and throat. This can make it painful to speak and swallow.  Dryness and a burning feeling in the mouth.  Saliva that is thick.  Trouble eating, drinking, and swallowing. This can  lead to weight loss. Symptoms of this condition can vary from mild to severe. Symptoms are usually seen 7-10 days after cancer treatment has started. How is this diagnosed? This condition can be diagnosed with a physical exam. In some cases, lab tests or cultures may be done to check for an associated infection. How is this treated? Treatment depends on the severity of the condition. Oral mucositis often heals on its own. Sometimes, changes in the cancer treatment can help. Treatment may include medicines or therapies, such as:  An antibiotic medicine to fight infection.  Medicine or therapies that help the cells in your mouth heal more quickly.  Chewing on ice before treatment to help prevent mucositis. Medicine may also be given to help control pain. This may include:  Pain relievers that are swished around in the mouth (topical anesthetics). These make the mouth numb to ease the pain.  Mouth rinses.  Prescribed, medicated gels. The gel coats the mouth. This protects nerve endings and lessens the pain.  Pain medicines. Follow these instructions at home: Medicines  Take or apply over-the-counter and prescription medicines only as told by your health care provider.  If you were prescribed an antibiotic medicine, take or apply it as told by your health care provider. Do not stop using the antibiotic even if you start to feel better.  Do not use products that contain benzocaine, including numbing gels, to treat mouth pain in children who are younger than 2 years. These products may cause a rare but serious blood condition. Eating and drinking   Talk to a diet and nutrition specialist (dietitian) about what you should eat  and drink if you have mucositis.  Drink high-nutrition and high-calorie shakes or supplements.  Eat bland and soft foods that are easy to eat.  Drink enough fluid to keep your urine pale yellow.  Do not eat foods that are hot, spicy, citrus, or hard to  swallow.  Do not drink alcohol.  Try sucking on ice chips or sugar-free frozen pops. This may help with pain. This also keeps your mouth moist. Lifestyle      Keep your mouth clean and germ-free. To maintain good oral hygiene: ? Brush your teeth carefully with a soft toothbrush at least two times each day. Use a gentle toothpaste. Ask your health care provider to recommend the right toothpaste for you. ? Use a soft sponge (oral swab) to clean your mouth and teeth instead of a toothbrush if mouth sores are severe. ? Floss your teeth every day. ? Have your teeth cleaned regularly as recommended by your dentist. ? Rinse your mouth after every meal or as directed by your health care provider. Do not use mouthwash that contains alcohol. Ask your health care provider for a mouthwash or mouth rinse recommendation.  Do not use any products that contain nicotine or tobacco, such as cigarettes, e-cigarettes, and chewing tobacco. If you need help quitting, ask your health care provider. General instructions  Follow instructions from your health care provider about: ? Cleaning mouth sores. ? Taking out your dentures. ? Changing your diet or finding other ways to get nutrients. This is important if you are losing weight.  If your lips are dry or cracked, apply a water-based moisturizer to your lips as needed.  Keep all follow-up visits as told by your health care provider. This is important. Contact a health care provider if:  You have mouth pain or throat pain.  Your symptoms get worse.  You have new symptoms.  Your pain is not controlled with medicine.  You have trouble speaking. Get help right away if you:  Have a fever.  Cannot swallow solid food or liquids.  Have a lot of bleeding in your mouth.  Develop new, open, or draining sores in your mouth. Summary  Oral mucositis is a mouth condition that may develop as a result of treatments for cancer.  Sores may appear on your  lips, gums, tongue, throat, and the top (roof) or bottom (floor) of your mouth.  Cancer treatments can damage the lining of the mouth, which causes this condition.  Treatment depends on how severe the condition is. It may include medicine or therapies to fight infection, medicine to ease pain, or medicine to help the cells in your mouth heal more quickly. This information is not intended to replace advice given to you by your health care provider. Make sure you discuss any questions you have with your health care provider. Document Revised: 03/27/2019 Document Reviewed: 03/27/2019 Elsevier Patient Education  [place].

## 2020-05-14 NOTE — Assessment & Plan Note (Addendum)
Recurrent and seemed to correlate with diuretic use and increased urination. Labs are unremarkable and she is being remotely monitored by afib clinic, she has an appt with them later this week. For now she notes improved symptoms with increased dosing of Metoprolol no changes at this time

## 2020-05-15 DIAGNOSIS — Z8719 Personal history of other diseases of the digestive system: Secondary | ICD-10-CM | POA: Insufficient documentation

## 2020-05-15 LAB — COMPREHENSIVE METABOLIC PANEL
AG Ratio: 1.8 (calc) (ref 1.0–2.5)
ALT: 10 U/L (ref 6–29)
AST: 14 U/L (ref 10–35)
Albumin: 4.4 g/dL (ref 3.6–5.1)
Alkaline phosphatase (APISO): 86 U/L (ref 37–153)
BUN/Creatinine Ratio: 14 (calc) (ref 6–22)
BUN: 17 mg/dL (ref 7–25)
CO2: 28 mmol/L (ref 20–32)
Calcium: 10.4 mg/dL (ref 8.6–10.4)
Chloride: 99 mmol/L (ref 98–110)
Creat: 1.18 mg/dL — ABNORMAL HIGH (ref 0.60–0.88)
Globulin: 2.5 g/dL (calc) (ref 1.9–3.7)
Glucose, Bld: 106 mg/dL — ABNORMAL HIGH (ref 65–99)
Potassium: 4.9 mmol/L (ref 3.5–5.3)
Sodium: 139 mmol/L (ref 135–146)
Total Bilirubin: 0.8 mg/dL (ref 0.2–1.2)
Total Protein: 6.9 g/dL (ref 6.1–8.1)

## 2020-05-15 LAB — LIPID PANEL
Cholesterol: 150 mg/dL (ref <200)
HDL: 41 mg/dL — ABNORMAL LOW (ref >=50)
LDL Cholesterol (Calc): 85 mg/dL (calc)
Non-HDL Cholesterol (Calc): 109 mg/dL (calc) (ref <130)
Total CHOL/HDL Ratio: 3.7 (calc) (ref <5.0)
Triglycerides: 142 mg/dL (ref <150)

## 2020-05-15 LAB — TSH: TSH: 1.41 mIU/L (ref 0.40–4.50)

## 2020-05-15 LAB — HEMOGLOBIN A1C
Hgb A1c MFr Bld: 5.6 % of total Hgb (ref <5.7)
Mean Plasma Glucose: 114 (calc)
eAG (mmol/L): 6.3 (calc)

## 2020-05-15 LAB — MAGNESIUM: Magnesium: 2.1 mg/dL (ref 1.5–2.5)

## 2020-05-15 NOTE — Assessment & Plan Note (Deleted)
She has continued to use O2 and is starting CPAP.

## 2020-05-15 NOTE — Assessment & Plan Note (Signed)
None presently but recurrent. She notes her oral surgeon has diagnosed oral lichen planus. She also notes once during an outbreak she took an antibiotic and found it helpful. She will let us know if she gets more and we can try a course of antibiotics and even a course of anti virals and Magic Mouthwash

## 2020-05-15 NOTE — Progress Notes (Addendum)
Subjective:    Patient ID: Tracy Miles, female    DOB: 1934-05-02, 86 y.o.   MRN: [id number]  Chief Complaint  Patient presents with  . Annual Exam  . Pt. states exper. Afib concerns since Sat.    HPI Patient is in today for follow up on chronic medical concerns. No recent febrile illness or hospitalizations. She has been having trouble with her afib this week. She has increased her Metoprolol to 2 tabs per dose and that has helped some. No syncope or falls but she has felt more tired or weak. Denies CP/SOB/HA/congestion/fevers/GI or GU c/o. Taking meds as prescribed  Past Medical History:  Diagnosis Date  . Anemia   . Aortic stenosis    Very mild  . Benign paroxysmal positional vertigo 12/17/2013  . Chicken pox as a child  . Colon polyp   . Coronary artery disease   . DDD (degenerative disc disease) 11/16/2013  . Hyperlipidemia   . Hypertension   . Iron deficiency anemia 11/13/2013  . Mumps child and teenager  . OA (osteoarthritis) 11/19/2013   S/p L TKR  . Obesity   . OSA on CPAP   . Pancreatitis    post hysterectomy  . Persistent atrial fibrillation ([place])   . Personal history of colonic polyps 02/25/2014  . Personal history of DVT (deep vein thrombosis) X 2   "left"  . Sleep apnea   . Spinal stenosis     Past Surgical History:  Procedure Laterality Date  . APPENDECTOMY    . ATRIAL FIBRILLATION ABLATION N/A 04/25/2019   Procedure: ATRIAL FIBRILLATION ABLATION;  Surgeon: [name], MD;  Location: [facility];  Service: Cardiovascular;  Laterality: N/A;  . CARDIOVERSION N/A 08/09/2015   Procedure: CARDIOVERSION;  Surgeon: Jerline Pain, MD;  Location: [facility];  Service: Cardiovascular;  Laterality: N/A;  . CARDIOVERSION N/A 09/30/2018   Procedure: CARDIOVERSION;  Surgeon: [name], MD;  Location: [facility] ENDOSCOPY;  Service: Cardiovascular;  Laterality: N/A;  . CARDIOVERSION N/A 02/27/2019   Procedure: CARDIOVERSION;  Surgeon: Buford Dresser, MD;  Location: [facility] ENDOSCOPY;  Service: Cardiovascular;  Laterality: N/A;  . CARDIOVERSION N/A 06/07/2019   Procedure: CARDIOVERSION;  Surgeon: [name], MD;  Location: [place];  Service: Cardiovascular;  Laterality: N/A;  . CATARACT EXTRACTION W/ INTRAOCULAR LENS  IMPLANT, BILATERAL Bilateral 2016  . CYST REMOVAL HAND Bilateral 1990's   "played to much golf"  . DILATION AND CURETTAGE OF UTERUS    . implantable loop recorder placement  11/20/2019   Medtronic Reveal Linq model LNQ 22 (model number [id number]) implanted in office by Dr Rayann Heman for afib management  . LUMBAR LAMINECTOMY  40 yrs ago   "L3-4"  . MENISCUS REPAIR Bilateral 2000 and 2010  . REPLACEMENT TOTAL KNEE Left 2013  . TEE WITHOUT CARDIOVERSION N/A 04/24/2019   Procedure: TRANSESOPHAGEAL ECHOCARDIOGRAM (TEE);  Surgeon: Acie Fredrickson Wonda Cheng, MD;  Location: [facility];  Service: Cardiovascular;  Laterality: N/A;  . TONSILECTOMY, ADENOIDECTOMY, BILATERAL MYRINGOTOMY AND TUBES  child  . TOTAL ABDOMINAL HYSTERECTOMY  1995   had 2 tumors- benign  . TUBAL LIGATION  86 years old    Family History  Problem Relation Age of Onset  . Heart attack Mother 35  . Hyperlipidemia Mother        ?  Marland Kitchen Dementia Mother   . Pernicious anemia Mother   . COPD Father        smoker  . Cancer Father 20  prostate  . Heart disease Brother        quadruple bipass surgery  . Hyperlipidemia Brother   . Hypertension Brother   . Diabetes Maternal Grandmother        type 2  . Pernicious anemia Maternal Grandmother   . Gout Son   . Atrial fibrillation Son   . Hyperlipidemia Son   . Cancer Maternal Grandfather        liver  . Cancer Paternal Grandmother        lung- doesn't think she smokes?  . Stroke Paternal Grandfather   . Cancer Son 50       non hodgin's lymphoma  . Gout Son   . Hyperlipidemia Son   . Sleep apnea Son   . Colon cancer Neg Hx   . Pancreatic cancer Neg Hx   . Stomach cancer Neg Hx   . Throat  cancer Neg Hx   . Liver disease Neg Hx     Social History   Socioeconomic History  . Marital status: Widowed    Spouse name: Not on file  . Number of children: 3  . Years of education: Not on file  . Highest education level: Not on file  Occupational History  . Occupation: retired Pharmacist, hospital  Tobacco Use  . Smoking status: Never Smoker  . Smokeless tobacco: Never Used  . Tobacco comment: never used tobacco  Substance and Sexual Activity  . Alcohol use: No  . Drug use: No  . Sexual activity: Never    Comment: lives at [place], low sodium diet  Other Topics Concern  . Not on file  Social History Narrative  . Not on file   Social Determinants of Health   Financial Resource Strain: Low Risk   . Difficulty of Paying Living Expenses: Not hard at all  Food Insecurity:   . Worried About Charity fundraiser in the Last Year: Not on file  . Ran Out of Food in the Last Year: Not on file  Transportation Needs:   . Lack of Transportation (Medical): Not on file  . Lack of Transportation (Non-Medical): Not on file  Physical Activity:   . Days of Exercise per Week: Not on file  . Minutes of Exercise per Session: Not on file  Stress:   . Feeling of Stress : Not on file  Social Connections:   . Frequency of Communication with Friends and Family: Not on file  . Frequency of Social Gatherings with Friends and Family: Not on file  . Attends Religious Services: Not on file  . Active Member of Clubs or Organizations: Not on file  . Attends Archivist Meetings: Not on file  . Marital Status: Not on file  Intimate Partner Violence:   . Fear of Current or Ex-Partner: Not on file  . Emotionally Abused: Not on file  . Physically Abused: Not on file  . Sexually Abused: Not on file    Outpatient Medications Prior to Visit  Medication Sig Dispense Refill  . acetaminophen (TYLENOL) 325 MG tablet Take 325 mg by mouth 2 (two) times daily as needed (pain.).     Marland Kitchen atorvastatin  (LIPITOR) 20 MG tablet TAKE 1 TABLET BY MOUTH  DAILY AT 6 PM (Patient taking differently: Take 20 mg by mouth every evening. ) 90 tablet 3  . Biotin 5000 MCG CAPS Take 5,000 mcg by mouth daily.     . Calcium Carb-Cholecalciferol (CALCIUM 600 + D PO) Take 1 tablet by mouth  2 (two) times daily.    . Carboxymethylcellul-Glycerin (LUBRICATING EYE DROPS OP) Place 1 drop into both eyes in the morning and at bedtime.     Marland Kitchen diltiazem (CARDIZEM CD) 120 MG 24 hr capsule Take 1 capsule (120 mg total) by mouth daily. 60 capsule 6  . dofetilide (TIKOSYN) 250 MCG capsule Take 1 capsule (250 mcg total) by mouth 2 (two) times daily. 60 capsule 8  . loratadine (CLARITIN) 10 MG tablet Take 10 mg by mouth daily.     . metoprolol succinate (TOPROL-XL) 25 MG 24 hr tablet TAKE 1 AND 1/2 TABLETS BY MOUTH TWICE DAILY (Patient taking differently: Take 50 mg by mouth in the morning, at noon, in the evening, and at bedtime. 2-am, 2- evening, temp. Pt. states) 270 tablet 2  . potassium chloride (KLOR-CON) 10 MEQ tablet TAKE 1 TABLET BY MOUTH IN  THE MORNING AND 2 TABLETS  IN THE EVENING (Patient taking differently: Take 10-20 mEq by mouth See admin instructions. TAKE 1 TABLET BY MOUTH IN  THE MORNING AND 2 TABLETS  IN THE EVENING  Take 1 tablet (10 meq) by mouth  In) 270 tablet 3  . Probiotic CAPS Take 1 capsule by mouth daily.    . psyllium (REGULOID) 0.52 g capsule Take 0.52 g by mouth 2 (two) times daily.     . bumetanide (BUMEX) 1 MG tablet TAKE ONE (1) TABLET BY MOUTH EVERY DAY (Patient taking differently: Take 1 mg by mouth daily. ) 90 tablet 1  . ELIQUIS 5 MG TABS tablet TAKE ONE TABLET BY MOUTH TWICE DAILY (Patient taking differently: Take 5 mg by mouth in the morning and at bedtime. ) 180 tablet 1  . lisinopril (ZESTRIL) 10 MG tablet Take 1 tablet (10 mg total) by mouth daily. 90 tablet 1   No facility-administered medications prior to visit.    Allergies  Allergen Reactions  . Gabapentin Swelling  . Lactose  Intolerance (Gi) Other (See Comments)    Bothers her stomach  . Zebeta [Bisoprolol Fumarate] Nausea Only  . Penicillins Hives and Rash    Did it involve swelling of the face/tongue/throat, SOB, or low BP? No Did it involve sudden or severe rash/hives, skin peeling, or any reaction on the inside of your mouth or nose? Yes Did you need to seek medical attention at a hospital or doctor's office? No When did it last happen? 25+ years If all above answers are "NO", may proceed with cephalosporin use.   . Sulfa Antibiotics Rash    Review of Systems  Constitutional: Negative for fever and malaise/fatigue.  HENT: Negative for congestion.   Eyes: Negative for blurred vision.  Respiratory: Negative for shortness of breath.   Cardiovascular: Positive for palpitations. Negative for chest pain, orthopnea and leg swelling.  Gastrointestinal: Negative for abdominal pain, blood in stool and nausea.  Genitourinary: Negative for dysuria and frequency.  Musculoskeletal: Negative for falls.  Skin: Negative for rash.  Neurological: Negative for dizziness, loss of consciousness and headaches.  Endo/Heme/Allergies: Negative for environmental allergies.  Psychiatric/Behavioral: Negative for depression. The patient is not nervous/anxious.        Objective:    Physical Exam Vitals and nursing note reviewed.  Constitutional:      General: She is not in acute distress.    Appearance: She is well-developed.  HENT:     Head: Normocephalic and atraumatic.     Nose: Nose normal.  Eyes:     General:  Right eye: No discharge.        Left eye: No discharge.  Cardiovascular:     Rate and Rhythm: Normal rate. Rhythm irregular.     Heart sounds: No murmur heard.   Pulmonary:     Effort: Pulmonary effort is normal.     Breath sounds: Normal breath sounds.  Abdominal:     General: Bowel sounds are normal.     Palpations: Abdomen is soft.     Tenderness: There is no abdominal tenderness.    Musculoskeletal:     Cervical back: Normal range of motion and neck supple.  Skin:    General: Skin is warm and dry.  Neurological:     Mental Status: She is alert and oriented to person, place, and time.     BP 118/72 (BP Location: Left Arm, Patient Position: Sitting, Cuff Size: Large)   Pulse 91   Temp 98.5 F (36.9 C) (Oral)   Resp 13   Ht 5\' 3"  (1.6 m)   Wt 275 lb 12.8 oz (125.1 kg)   SpO2 97%   BMI 48.86 kg/m  Wt Readings from Last 3 Encounters:  05/14/20 275 lb 12.8 oz (125.1 kg)  05/02/20 278 lb 9.6 oz (126.4 kg)  04/17/20 278 lb 3.2 oz (126.2 kg)    Diabetic Foot Exam - Simple   No data filed     Lab Results  Component Value Date   WBC 6.8 05/02/2020   HGB 13.4 05/02/2020   HCT 41.6 05/02/2020   PLT 248 05/02/2020   GLUCOSE 106 (H) 05/14/2020   CHOL 150 05/14/2020   TRIG 142 05/14/2020   HDL 41 (L) 05/14/2020   LDLCALC 85 05/14/2020   ALT 10 05/14/2020   AST 14 05/14/2020   NA 139 05/14/2020   K 4.9 05/14/2020   CL 99 05/14/2020   CREATININE 1.18 (H) 05/14/2020   BUN 17 05/14/2020   CO2 28 05/14/2020   TSH 1.41 05/14/2020   INR 1.55 (H) 08/06/2015   HGBA1C 5.6 05/14/2020    Lab Results  Component Value Date   TSH 1.41 05/14/2020   Lab Results  Component Value Date   WBC 6.8 05/02/2020   HGB 13.4 05/02/2020   HCT 41.6 05/02/2020   MCV 96.7 05/02/2020   PLT 248 05/02/2020   Lab Results  Component Value Date   NA 139 05/14/2020   K 4.9 05/14/2020   CO2 28 05/14/2020   GLUCOSE 106 (H) 05/14/2020   BUN 17 05/14/2020   CREATININE 1.18 (H) 05/14/2020   BILITOT 0.8 05/14/2020   ALKPHOS 116 11/07/2019   AST 14 05/14/2020   ALT 10 05/14/2020   PROT 6.9 05/14/2020   ALBUMIN 4.2 11/07/2019   CALCIUM 10.4 05/14/2020   ANIONGAP 12 05/02/2020   GFR 58.96 (L) 11/07/2019   Lab Results  Component Value Date   CHOL 150 05/14/2020   Lab Results  Component Value Date   HDL 41 (L) 05/14/2020   Lab Results  Component Value Date   LDLCALC  85 05/14/2020   Lab Results  Component Value Date   TRIG 142 05/14/2020   Lab Results  Component Value Date   CHOLHDL 3.7 05/14/2020   Lab Results  Component Value Date   HGBA1C 5.6 05/14/2020       Assessment & Plan:   Problem List Items Addressed This Visit    Atrial fibrillation ([place])    Recurrent and seemed to correlate with diuretic use and increased urination. Labs are  unremarkable and she is being remotely monitored by afib clinic, she has an appt with them later this week. For now she notes improved symptoms with increased dosing of Metoprolol no changes at this time      Relevant Medications   lisinopril (ZESTRIL) 10 MG tablet   bumetanide (BUMEX) 1 MG tablet   apixaban (ELIQUIS) 5 MG TABS tablet   Other Relevant Orders   Magnesium (Completed)   Comprehensive metabolic panel (Completed)   Essential hypertension    Well controlled, no changes to meds. Encouraged heart healthy diet such as the DASH diet and exercise as tolerated.       Relevant Medications   lisinopril (ZESTRIL) 10 MG tablet   bumetanide (BUMEX) 1 MG tablet   apixaban (ELIQUIS) 5 MG TABS tablet   Other Relevant Orders   Magnesium (Completed)   Comprehensive metabolic panel (Completed)   TSH (Completed)   Hyperlipidemia, mixed - Primary    Encouraged heart healthy diet, increase exercise, avoid trans fats, consider a krill oil cap daily      Relevant Medications   lisinopril (ZESTRIL) 10 MG tablet   bumetanide (BUMEX) 1 MG tablet   apixaban (ELIQUIS) 5 MG TABS tablet   Other Relevant Orders   Lipid panel (Completed)   Obstructive sleep apnea   Hyperglycemia    hgba1c acceptable, minimize simple carbs. Increase exercise as tolerated.      Relevant Orders   Hemoglobin A1c (Completed)   History of oral lesions    None presently but recurrent. She notes her oral surgeon has diagnosed oral lichen planus. She also notes once during an outbreak she took an antibiotic and found it helpful.  She will let us know if she gets more and we can try a course of antibiotics and even a course of anti virals and Magic Mouthwash         I have changed Benjamine Mola Kolk "Betty"'s Eliquis to apixaban. I have also changed her bumetanide. I am also having her maintain her loratadine, psyllium, acetaminophen, Calcium Carb-Cholecalciferol (CALCIUM 600 + D PO), Probiotic, Carboxymethylcellul-Glycerin (LUBRICATING EYE DROPS OP), dofetilide, diltiazem, Biotin, potassium chloride, atorvastatin, metoprolol succinate, and lisinopril.  Meds ordered this encounter  Medications  . lisinopril (ZESTRIL) 10 MG tablet    Sig: Take 1 tablet (10 mg total) by mouth daily.    Dispense:  90 tablet    Refill:  1  . bumetanide (BUMEX) 1 MG tablet    Sig: Take 1 tablet (1 mg total) by mouth daily.    Dispense:  90 tablet    Refill:  1  . apixaban (ELIQUIS) 5 MG TABS tablet    Sig: Take 1 tablet (5 mg total) by mouth in the morning and at bedtime.    Dispense:  180 tablet    Refill:  1     [name], MD

## 2020-05-15 NOTE — Assessment & Plan Note (Signed)
Encouraged heart healthy diet, increase exercise, avoid trans fats, consider a krill oil cap daily 

## 2020-05-15 NOTE — Assessment & Plan Note (Signed)
hgba1c acceptable, minimize simple carbs. Increase exercise as tolerated.  

## 2020-05-17 ENCOUNTER — Encounter (HOSPITAL_COMMUNITY): Payer: Self-pay | Admitting: Nurse Practitioner

## 2020-05-17 ENCOUNTER — Ambulatory Visit (HOSPITAL_COMMUNITY)
Admission: RE | Admit: 2020-05-17 | Discharge: 2020-05-17 | Disposition: A | Discharge status: Home / Self Care | Payer: Medicare Other | Source: Ambulatory Visit | Attending: Nurse Practitioner | Admitting: Nurse Practitioner

## 2020-05-17 ENCOUNTER — Other Ambulatory Visit: Payer: Self-pay

## 2020-05-17 VITALS — BP 160/90 | HR 67 | Ht 63.0 in | Wt 277.0 lb

## 2020-05-17 DIAGNOSIS — Z86718 Personal history of other venous thrombosis and embolism: Secondary | ICD-10-CM | POA: Diagnosis not present

## 2020-05-17 DIAGNOSIS — G4733 Obstructive sleep apnea (adult) (pediatric): Secondary | ICD-10-CM | POA: Insufficient documentation

## 2020-05-17 DIAGNOSIS — I4892 Unspecified atrial flutter: Secondary | ICD-10-CM | POA: Insufficient documentation

## 2020-05-17 DIAGNOSIS — E785 Hyperlipidemia, unspecified: Secondary | ICD-10-CM | POA: Diagnosis not present

## 2020-05-17 DIAGNOSIS — M1990 Unspecified osteoarthritis, unspecified site: Secondary | ICD-10-CM | POA: Insufficient documentation

## 2020-05-17 DIAGNOSIS — I350 Nonrheumatic aortic (valve) stenosis: Secondary | ICD-10-CM | POA: Diagnosis not present

## 2020-05-17 DIAGNOSIS — I10 Essential (primary) hypertension: Secondary | ICD-10-CM | POA: Insufficient documentation

## 2020-05-17 DIAGNOSIS — I2510 Atherosclerotic heart disease of native coronary artery without angina pectoris: Secondary | ICD-10-CM | POA: Insufficient documentation

## 2020-05-17 DIAGNOSIS — D6869 Other thrombophilia: Secondary | ICD-10-CM | POA: Diagnosis not present

## 2020-05-17 DIAGNOSIS — Z7901 Long term (current) use of anticoagulants: Secondary | ICD-10-CM | POA: Diagnosis not present

## 2020-05-17 DIAGNOSIS — Z79899 Other long term (current) drug therapy: Secondary | ICD-10-CM | POA: Diagnosis not present

## 2020-05-17 DIAGNOSIS — I4819 Other persistent atrial fibrillation: Secondary | ICD-10-CM | POA: Insufficient documentation

## 2020-05-17 DIAGNOSIS — E669 Obesity, unspecified: Secondary | ICD-10-CM | POA: Diagnosis not present

## 2020-05-17 NOTE — Progress Notes (Signed)
PCP: Tracy Lukes, MD  [name] is a 86 y.o. female who presents today for  electrophysiology follow up afib ablation 6 weeks ago. She had developed medicine refractory atrial fibrillation. She had  hematoma of the abdomen and both groins that gradually cleared. She continued  on tikosyn.  She has had intermittent afib since the procedure and was in afib  at one month f/u. She continued to watch her heart rates for the last couple of weeks and is consistently out of rhythm. She has been able to return to exercising in the pool and this has made her feel better. Her v rates are quite elevated in the 120's. Higher does of BB in the past have not helped and increased dose of diltiazem increases her swelling.  No swallowing issues since the procedure.   F/u in afib clinic,10/15, she is s/p successful cardioversion and remains in SR. She is very happy about this and is feeling much improved.   F/u in afib clinic, 05/02/20. She has received a Linq since I saw her last, and the device clinic let her know yesterday that she was out of rhythm. EKG today shows atrial flutter at 124 ms. She continues on eliquis and tikosyn. CHA2DS2VASc score is  6.No trigger that pt is aware of. She did increase metoprolol to 50 mg bid. We scheduled for a cardioversion but she self converted several days after I saw here.  F/u in afib clinic, 9/17. She called [place] last week as she went out of rhythm again and she increased her BB to 50 mg bid. After several days she converted. She  in rhythm today. She has gone back to 37.5 mg bid . She would like to discuss possibility of repeat ablation as the extra BB and being in afib often recently is making her more fatigued. She has not tolerated amiodarone  in the past for evidence of pulmonary fibrosis.   Today, she denies symptoms of palpitations, chest pain,   lower extremity edema, dizziness, presyncope, or syncope.  The patient is otherwise without  complaint today.   Past Medical History:  Diagnosis Date  . Anemia   . Aortic stenosis    Very mild  . Benign paroxysmal positional vertigo 12/17/2013  . Chicken pox as a child  . Colon polyp   . Coronary artery disease   . DDD (degenerative disc disease) 11/16/2013  . Hyperlipidemia   . Hypertension   . Iron deficiency anemia 11/13/2013  . Mumps child and teenager  . OA (osteoarthritis) 11/19/2013   S/p L TKR  . Obesity   . OSA on CPAP   . Pancreatitis    post hysterectomy  . Persistent atrial fibrillation ([place])   . Personal history of colonic polyps 02/25/2014  . Personal history of DVT (deep vein thrombosis) X 2   "left"  . Sleep apnea   . Spinal stenosis    Past Surgical History:  Procedure Laterality Date  . APPENDECTOMY    . ATRIAL FIBRILLATION ABLATION N/A 04/25/2019   Procedure: ATRIAL FIBRILLATION ABLATION;  Surgeon: [name], MD;  Location: [place] [facility];  Service: Cardiovascular;  Laterality: N/A;  . CARDIOVERSION N/A 08/09/2015   Procedure: CARDIOVERSION;  Surgeon: Jerline Pain, MD;  Location: [facility] ENDOSCOPY;  Service: Cardiovascular;  Laterality: N/A;  . CARDIOVERSION N/A 09/30/2018   Procedure: CARDIOVERSION;  Surgeon: [name], MD;  Location: [place];  Service: Cardiovascular;  Laterality: N/A;  . CARDIOVERSION  N/A 02/27/2019   Procedure: CARDIOVERSION;  Surgeon: Buford Dresser, MD;  Location: [facility] ENDOSCOPY;  Service: Cardiovascular;  Laterality: N/A;  . CARDIOVERSION N/A 06/07/2019   Procedure: CARDIOVERSION;  Surgeon: [name], MD;  Location: [facility] ENDOSCOPY;  Service: Cardiovascular;  Laterality: N/A;  . CATARACT EXTRACTION W/ INTRAOCULAR LENS  IMPLANT, BILATERAL Bilateral 2016  . CYST REMOVAL HAND Bilateral 1990's   "played to much golf"  . DILATION AND CURETTAGE OF UTERUS    . implantable loop recorder placement  11/20/2019   Medtronic Reveal Linq model LNQ 22 (model number [id number]) implanted in office by Dr Rayann Heman for  afib management  . LUMBAR LAMINECTOMY  40 yrs ago   "L3-4"  . MENISCUS REPAIR Bilateral 2000 and 2010  . REPLACEMENT TOTAL KNEE Left 2013  . TEE WITHOUT CARDIOVERSION N/A 04/24/2019   Procedure: TRANSESOPHAGEAL ECHOCARDIOGRAM (TEE);  Surgeon: Acie Fredrickson Wonda Cheng, MD;  Location: [place];  Service: Cardiovascular;  Laterality: N/A;  . TONSILECTOMY, ADENOIDECTOMY, BILATERAL MYRINGOTOMY AND TUBES  child  . TOTAL ABDOMINAL HYSTERECTOMY  1995   had 2 tumors- benign  . TUBAL LIGATION  86 years old    ROS- all systems are reviewed and negatives except as per HPI above  Current Outpatient Medications  Medication Sig Dispense Refill  . acetaminophen (TYLENOL) 325 MG tablet Take 325 mg by mouth 2 (two) times daily as needed (pain.).     Marland Kitchen apixaban (ELIQUIS) 5 MG TABS tablet Take 1 tablet (5 mg total) by mouth in the morning and at bedtime. 180 tablet 1  . atorvastatin (LIPITOR) 20 MG tablet TAKE 1 TABLET BY MOUTH  DAILY AT 6 PM (Patient taking differently: Take 20 mg by mouth every evening. ) 90 tablet 3  . Biotin 5000 MCG CAPS Take 5,000 mcg by mouth daily.     . bumetanide (BUMEX) 1 MG tablet Take 1 tablet (1 mg total) by mouth daily. 90 tablet 1  . Calcium Carb-Cholecalciferol (CALCIUM 600 + D PO) Take 1 tablet by mouth 2 (two) times daily.    . Carboxymethylcellul-Glycerin (LUBRICATING EYE DROPS OP) Place 1 drop into both eyes in the morning and at bedtime.     Marland Kitchen diltiazem (CARDIZEM CD) 120 MG 24 hr capsule Take 1 capsule (120 mg total) by mouth daily. 60 capsule 6  . dofetilide (TIKOSYN) 250 MCG capsule Take 1 capsule (250 mcg total) by mouth 2 (two) times daily. 60 capsule 8  . lisinopril (ZESTRIL) 10 MG tablet Take 1 tablet (10 mg total) by mouth daily. 90 tablet 1  . loratadine (CLARITIN) 10 MG tablet Take 10 mg by mouth daily.     . metoprolol succinate (TOPROL-XL) 25 MG 24 hr tablet TAKE 1 AND 1/2 TABLETS BY MOUTH TWICE DAILY (Patient taking differently: Take 50 mg by mouth in the  morning, at noon, in the evening, and at bedtime. 2-am, 2- evening, temp. Pt. states) 270 tablet 2  . potassium chloride (KLOR-CON) 10 MEQ tablet TAKE 1 TABLET BY MOUTH IN  THE MORNING AND 2 TABLETS  IN THE EVENING 270 tablet 3  . Probiotic CAPS Take 1 capsule by mouth daily.    . psyllium (REGULOID) 0.52 g capsule Take 0.52 g by mouth 2 (two) times daily.      No current facility-administered medications for this encounter.    Physical Exam: Vitals:   05/17/20 1138  BP: (!) 160/90  Pulse: 67  Weight: 125.6 kg  Height: 5\' 3"  (1.6 m)    GEN- The patient  is well appearing, alert and oriented x 3 today.   Head- normocephalic, atraumatic Eyes-  Sclera clear, conjunctiva pink Ears- hearing intact Oropharynx- clear Lungs- Clear to ausculation bilaterally, normal work of breathing Heart-  regular rhythm GI- soft, NT, ND, + BS Extremities- no clubbing, cyanosis, or edema  Wt Readings from Last 3 Encounters:  05/17/20 125.6 kg  05/14/20 125.1 kg  05/02/20 126.4 kg    EKG - NSR at 67 bpm, PR int 200 ms, qrs int 78 ms, qtc 464 ms   Recent echo is also reviewed  Assessment and Plan:  1. Persistent atrial fibrillation/flutter  S/p afib ablation 04/25/19 Required CV right after ablation but since then has enjoyed SR until the last month and has had bouts of afib/flutter  lasting days at a time  Continue Tikosyn/diltaizem /metorpolol at 37.5 mg bid  Continue eliquis 5 mg bid with  CHA2DS2VASc score of at least 6 Has had both covid shots  Labs checked with PCP 9/14 and stable   2.OSA She reports compliance with CPAP  3. HTN Elevated today At home readings stable    I will refer back to Dr. Rayann Heman for a F to F appointment regarding redo ablation    Tracy Miles, [facility] [address] [place], [place] [address] [phone]

## 2020-05-18 IMAGING — CT CT NECK W/ CM
3 of 5 series · 12 of 33 positions shown, 14 images · IV contrast (iopamidol)
Comparison: None.

CLINICAL DATA: Posterior neck mass. Painful and increasing in size.

EXAM:
CT NECK WITH CONTRAST
TECHNIQUE: Multidetector CT imaging of the neck was performed using the
standard protocol following the bolus administration of intravenous
contrast.
CONTRAST:  75mL 0EXKXU-188 IOPAMIDOL (0EXKXU-188) INJECTION 61%

[Series 4: sag neck · sagittal · 0.45mm/px · 5 of 106 slices shown, 6 images]
[im 36/106  bone]
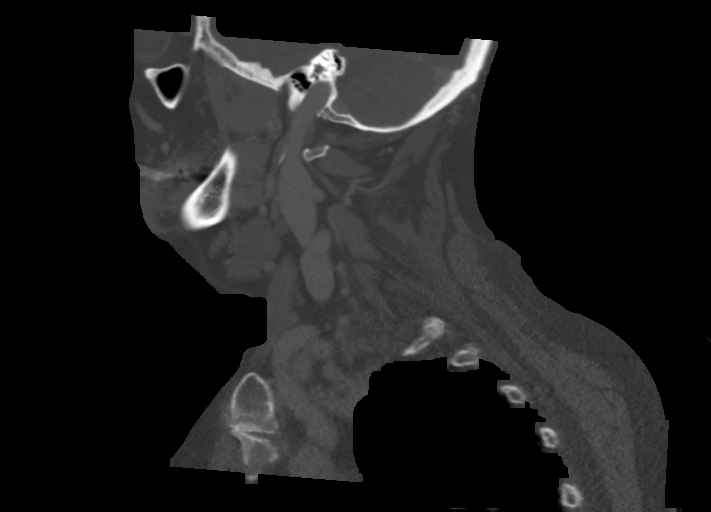
[im 44/106  bone]
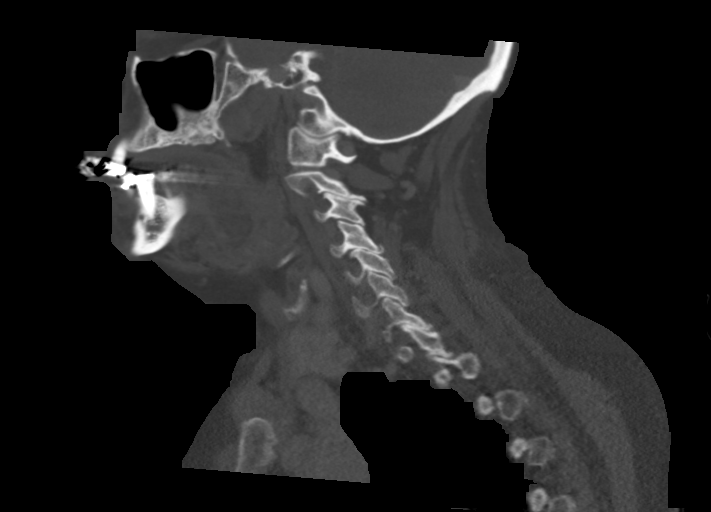
[im 53/106  soft-tissue]
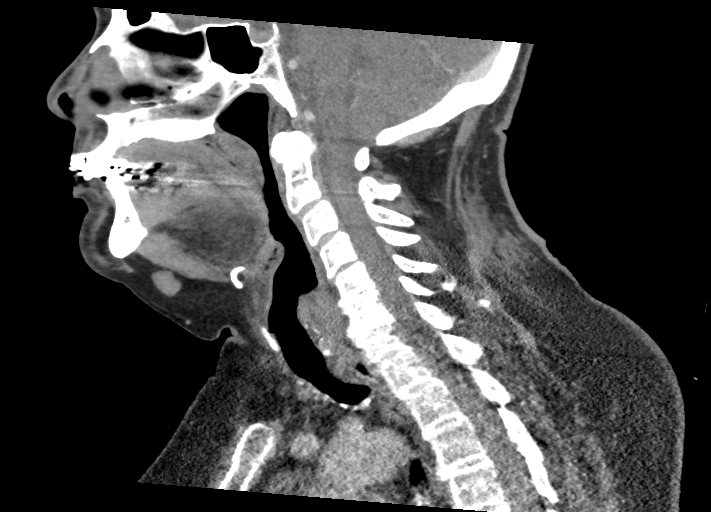
[im 53/106  bone]
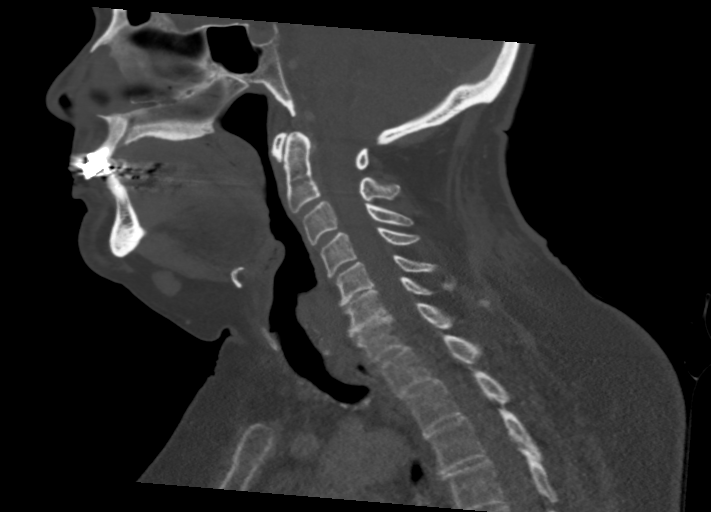
[im 62/106  bone]
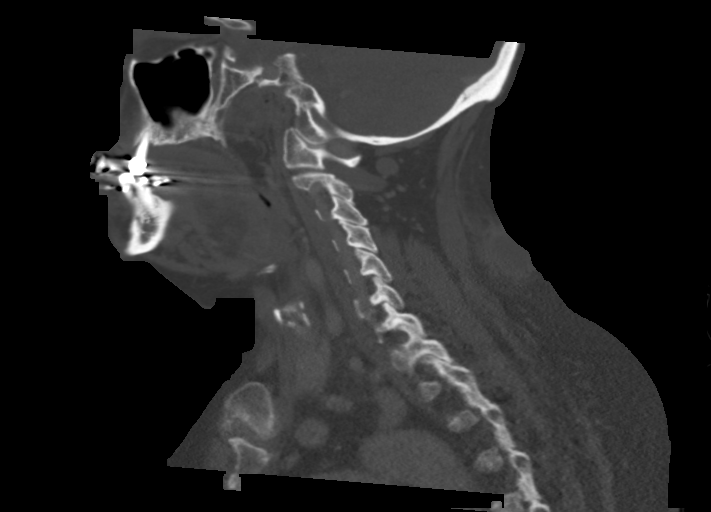
[im 71/106  bone]
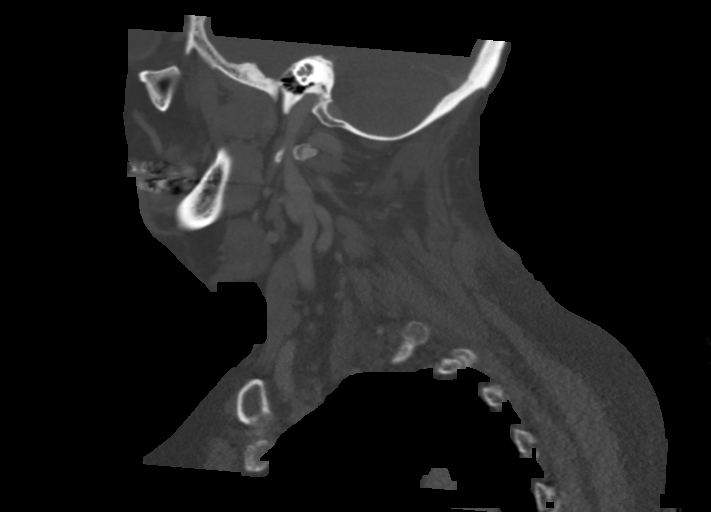

[Series 5: cor neck · coronal · 0.45mm/px · 3 of 156 slices shown]
[im 32/156  bone]
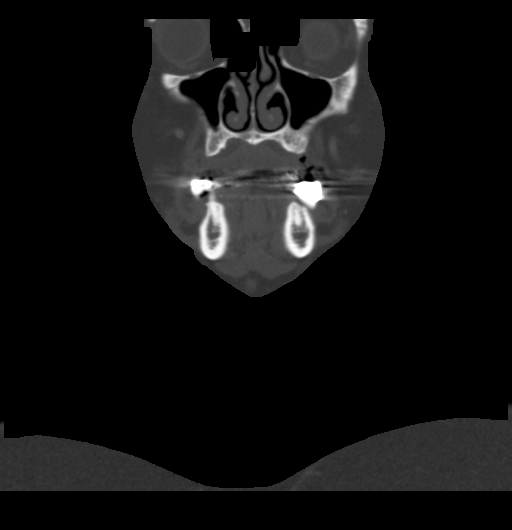
[im 63/156  bone]
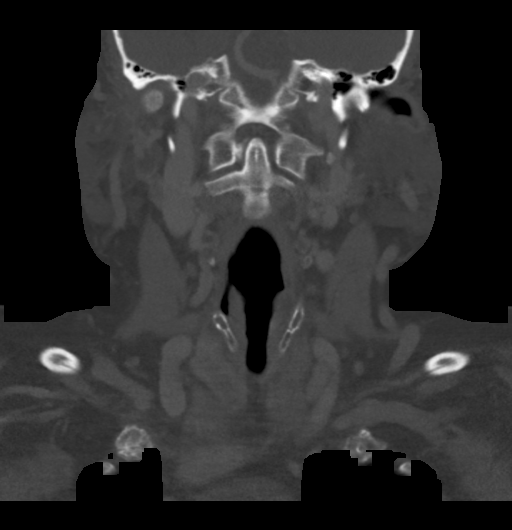
[im 94/156  bone]
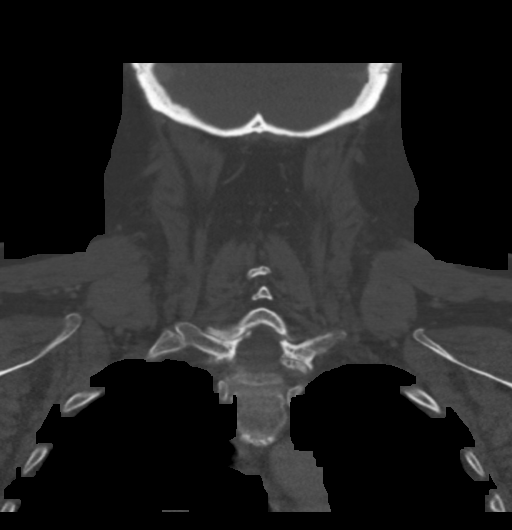

[Series 6: orthogonal ax · axial · 0.47mm/px · z∈[-262,-125]mm · 4 of 116 slices shown, 5 images]
[im 24/116  soft-tissue]
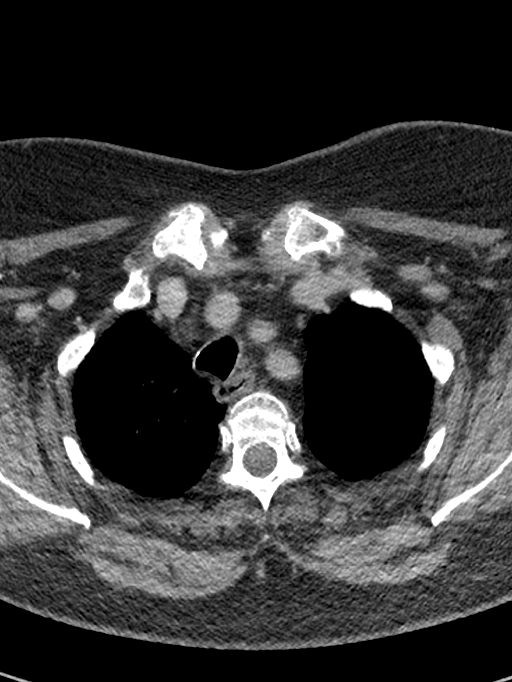
[im 24/116  bone]
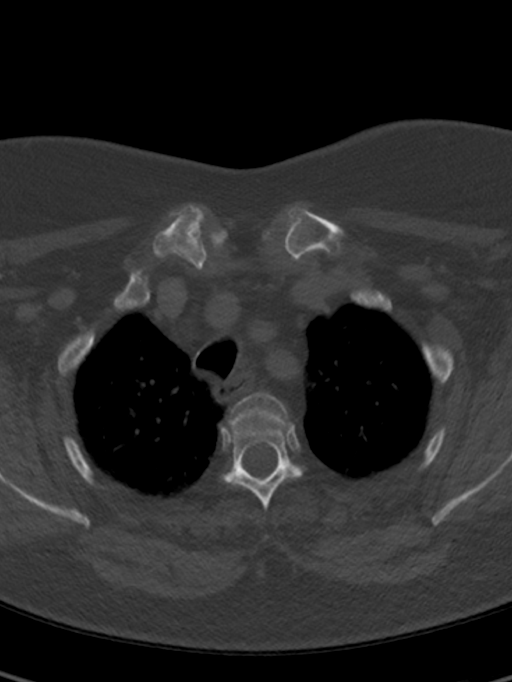
[im 47/116  bone]
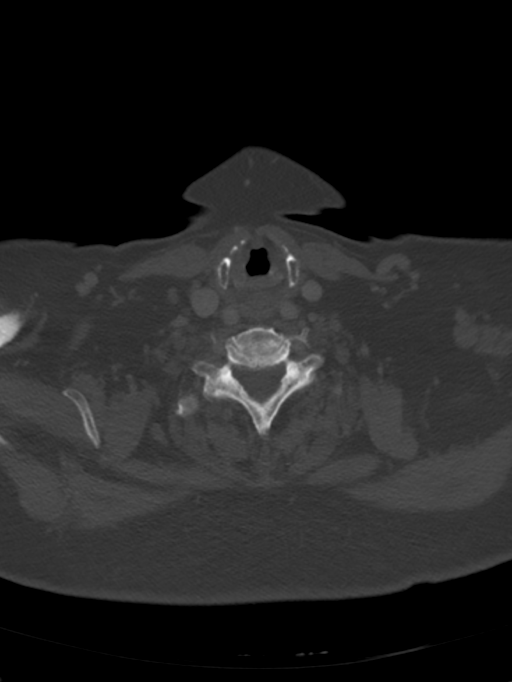
[im 70/116  bone]
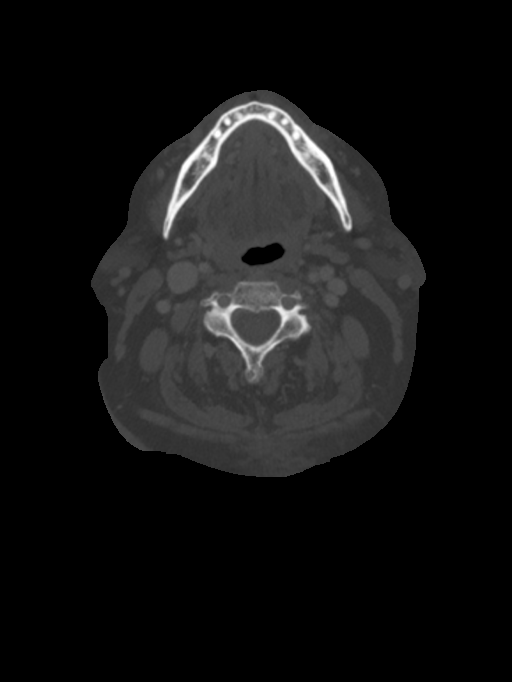
[im 93/116  bone]
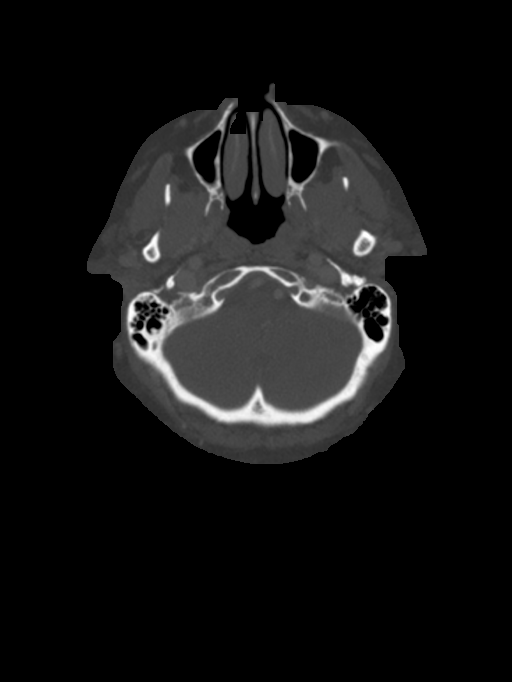

[12 of 33 positions shown; findings below may reference images not displayed]

FINDINGS: PHARYNX AND LARYNX:

--Nasopharynx: Fossae of Tour are clear. Normal adenoid
tonsils for age.

--Oral cavity and oropharynx: The palatine and lingual tonsils are
normal. The visible oral cavity and floor of mouth are normal.

--Hypopharynx: Normal vallecula and pyriform sinuses.

--Larynx: Normal epiglottis and pre-epiglottic space. Normal
aryepiglottic and vocal folds.

--Retropharyngeal space: No abscess, effusion or lymphadenopathy.

SALIVARY GLANDS:

--Parotid: No mass lesion or inflammation. No sialolithiasis or
ductal dilatation.

--Submandibular: Symmetric without inflammation. No sialolithiasis
or ductal dilatation.

--Sublingual: Normal. No ranula or other visible lesion of the base
of tongue and floor of mouth.

THYROID: Normal.

LYMPH NODES: No enlarged or abnormal density lymph nodes.

VASCULAR: Major cervical vessels are patent.

LIMITED INTRACRANIAL: Normal.

VISUALIZED ORBITS: Normal.

MASTOIDS AND VISUALIZED PARANASAL SINUSES: No fluid levels or
advanced mucosal thickening. No mastoid effusion.

SKELETON: No bony spinal canal stenosis. No lytic or blastic
lesions.

UPPER CHEST: Clear.

OTHER: Peripherally enhancing collection of the posterior midline
neck subcutaneous tissues measures 2.7 x 2.7 cm.
IMPRESSION: Subcutaneous abscess of the posterior midline neck measuring 2.7 x
2.7 cm.

## 2020-05-20 ENCOUNTER — Other Ambulatory Visit: Payer: Self-pay | Admitting: Internal Medicine

## 2020-05-28 ENCOUNTER — Ambulatory Visit (HOSPITAL_COMMUNITY): Payer: Medicare Other | Admitting: Nurse Practitioner

## 2020-06-04 ENCOUNTER — Ambulatory Visit (INDEPENDENT_AMBULATORY_CARE_PROVIDER_SITE_OTHER): Payer: Medicare Other

## 2020-06-04 DIAGNOSIS — I4819 Other persistent atrial fibrillation: Secondary | ICD-10-CM | POA: Diagnosis not present

## 2020-06-04 LAB — CUP PACEART REMOTE DEVICE CHECK
Date Time Interrogation Session: 20211004230159
Implantable Pulse Generator Implant Date: 20210322

## 2020-06-05 DIAGNOSIS — H26493 Other secondary cataract, bilateral: Secondary | ICD-10-CM | POA: Diagnosis not present

## 2020-06-05 DIAGNOSIS — H04123 Dry eye syndrome of bilateral lacrimal glands: Secondary | ICD-10-CM | POA: Diagnosis not present

## 2020-06-05 DIAGNOSIS — H524 Presbyopia: Secondary | ICD-10-CM | POA: Diagnosis not present

## 2020-06-05 DIAGNOSIS — H52223 Regular astigmatism, bilateral: Secondary | ICD-10-CM | POA: Diagnosis not present

## 2020-06-07 NOTE — Progress Notes (Signed)
Carelink Summary Report / Loop Recorder 

## 2020-06-10 ENCOUNTER — Ambulatory Visit (INDEPENDENT_AMBULATORY_CARE_PROVIDER_SITE_OTHER): Payer: Medicare Other | Admitting: Internal Medicine

## 2020-06-10 ENCOUNTER — Encounter: Payer: Self-pay | Admitting: Internal Medicine

## 2020-06-10 ENCOUNTER — Other Ambulatory Visit: Payer: Self-pay

## 2020-06-10 VITALS — BP 172/86 | HR 75 | Ht 63.0 in | Wt 277.8 lb

## 2020-06-10 DIAGNOSIS — D6869 Other thrombophilia: Secondary | ICD-10-CM | POA: Diagnosis not present

## 2020-06-10 DIAGNOSIS — I10 Essential (primary) hypertension: Secondary | ICD-10-CM | POA: Diagnosis not present

## 2020-06-10 DIAGNOSIS — I5032 Chronic diastolic (congestive) heart failure: Secondary | ICD-10-CM | POA: Diagnosis not present

## 2020-06-10 DIAGNOSIS — I4819 Other persistent atrial fibrillation: Secondary | ICD-10-CM | POA: Diagnosis not present

## 2020-06-10 DIAGNOSIS — G4733 Obstructive sleep apnea (adult) (pediatric): Secondary | ICD-10-CM

## 2020-06-10 NOTE — Progress Notes (Signed)
PCP: [name], MD   [name] is a 86 y.o. female who presents today for routine electrophysiology followup.  Since last being seen in our clinic, the patient reports doing very well.  She has occasional afib.  Not very active.  She has ongoing knee pain which limits exercise. Today, she denies symptoms of palpitations, chest pain, shortness of breath,  lower extremity edema, dizziness, presyncope, or syncope.  The patient is otherwise without complaint today.   Past Medical History:  Diagnosis Date  . Anemia   . Aortic stenosis    Very mild  . Benign paroxysmal positional vertigo 12/17/2013  . Chicken pox as a child  . Colon polyp   . Coronary artery disease   . DDD (degenerative disc disease) 11/16/2013  . Hyperlipidemia   . Hypertension   . Iron deficiency anemia 11/13/2013  . Mumps child and teenager  . OA (osteoarthritis) 11/19/2013   S/p L TKR  . Obesity   . OSA on CPAP   . Pancreatitis    post hysterectomy  . Persistent atrial fibrillation ([place])   . Personal history of colonic polyps 02/25/2014  . Personal history of DVT (deep vein thrombosis) X 2   "left"  . Sleep apnea   . Spinal stenosis    Past Surgical History:  Procedure Laterality Date  . APPENDECTOMY    . ATRIAL FIBRILLATION ABLATION N/A 04/25/2019   Procedure: ATRIAL FIBRILLATION ABLATION;  Surgeon: [name], MD;  Location: [place] [facility];  Service: Cardiovascular;  Laterality: N/A;  . CARDIOVERSION N/A 08/09/2015   Procedure: CARDIOVERSION;  Surgeon: Jerline Pain, MD;  Location: [place];  Service: Cardiovascular;  Laterality: N/A;  . CARDIOVERSION N/A 09/30/2018   Procedure: CARDIOVERSION;  Surgeon: [name], MD;  Location: [facility] ENDOSCOPY;  Service: Cardiovascular;  Laterality: N/A;  . CARDIOVERSION N/A 02/27/2019   Procedure: CARDIOVERSION;  Surgeon: Buford Dresser, MD;  Location: [facility] ENDOSCOPY;  Service: Cardiovascular;  Laterality: N/A;  .  CARDIOVERSION N/A 06/07/2019   Procedure: CARDIOVERSION;  Surgeon: [name], MD;  Location: [place];  Service: Cardiovascular;  Laterality: N/A;  . CATARACT EXTRACTION W/ INTRAOCULAR LENS  IMPLANT, BILATERAL Bilateral 2016  . CYST REMOVAL HAND Bilateral 1990's   "played to much golf"  . DILATION AND CURETTAGE OF UTERUS    . implantable loop recorder placement  11/20/2019   Medtronic Reveal Linq model LNQ 22 (model number [id number]) implanted in office by Dr Rayann Heman for afib management  . LUMBAR LAMINECTOMY  40 yrs ago   "L3-4"  . MENISCUS REPAIR Bilateral 2000 and 2010  . REPLACEMENT TOTAL KNEE Left 2013  . TEE WITHOUT CARDIOVERSION N/A 04/24/2019   Procedure: TRANSESOPHAGEAL ECHOCARDIOGRAM (TEE);  Surgeon: Acie Fredrickson Wonda Cheng, MD;  Location: [place];  Service: Cardiovascular;  Laterality: N/A;  . TONSILECTOMY, ADENOIDECTOMY, BILATERAL MYRINGOTOMY AND TUBES  child  . TOTAL ABDOMINAL HYSTERECTOMY  1995   had 2 tumors- benign  . TUBAL LIGATION  86 years old    ROS- all systems are reviewed and negatives except as per HPI above  Current Outpatient Medications  Medication Sig Dispense Refill  . acetaminophen (TYLENOL) 325 MG tablet Take 325 mg by mouth 2 (two) times daily as needed (pain.).     Marland Kitchen apixaban (ELIQUIS) 5 MG TABS tablet Take 1 tablet (5 mg total) by mouth in the morning and at bedtime. 180 tablet 1  . atorvastatin (LIPITOR) 20 MG tablet TAKE 1 TABLET BY  MOUTH  DAILY AT 6 PM 90 tablet 3  . Biotin 5000 MCG CAPS Take 5,000 mcg by mouth daily.     . bumetanide (BUMEX) 1 MG tablet Take 1 tablet (1 mg total) by mouth daily. 90 tablet 1  . Calcium Carb-Cholecalciferol (CALCIUM 600 + D PO) Take 1 tablet by mouth 2 (two) times daily.    . Carboxymethylcellul-Glycerin (LUBRICATING EYE DROPS OP) Place 1 drop into both eyes in the morning and at bedtime.     Marland Kitchen diltiazem (CARDIZEM CD) 120 MG 24 hr capsule Take 1 capsule (120 mg total) by mouth daily. 60 capsule 6  .  dofetilide (TIKOSYN) 250 MCG capsule TAKE ONE (1) CAPSULE BY MOUTH 2 TIMES DAILY 60 capsule 8  . lisinopril (ZESTRIL) 10 MG tablet Take 1 tablet (10 mg total) by mouth daily. 90 tablet 1  . loratadine (CLARITIN) 10 MG tablet Take 10 mg by mouth daily.     . metoprolol succinate (TOPROL-XL) 25 MG 24 hr tablet TAKE 1 AND 1/2 TABLETS BY MOUTH TWICE DAILY 270 tablet 2  . Multiple Vitamins-Minerals (ONE-A-DAY WOMENS 50 PLUS PO) Take 1 tablet by mouth daily.    . potassium chloride (KLOR-CON) 10 MEQ tablet TAKE 1 TABLET BY MOUTH IN  THE MORNING AND 2 TABLETS  IN THE EVENING 270 tablet 3  . Probiotic CAPS Take 1 capsule by mouth daily.    . psyllium (REGULOID) 0.52 g capsule Take 0.52 g by mouth 2 (two) times daily.      No current facility-administered medications for this visit.    Physical Exam: Vitals:   06/10/20 1100  BP: (!) 172/86  Pulse: 75  SpO2: 96%  Weight: 277 lb 12.8 oz (126 kg)  Height: 5\' 3"  (1.6 m)    GEN- The patient is morbidly obese appearing, alert and oriented x 3 today.   Head- normocephalic, atraumatic Eyes-  Sclera clear, conjunctiva pink Ears- hearing intact Oropharynx- clear Lungs-   normal work of breathing Heart- Regular rate and rhythm  GI- soft,  Extremities- no clubbing, cyanosis, or edema  Wt Readings from Last 3 Encounters:  06/10/20 277 lb 12.8 oz (126 kg)  05/17/20 277 lb (125.6 kg)  05/14/20 275 lb 12.8 oz (125.1 kg)    EKG tracing ordered today is personally reviewed and shows sinus rhythm with PR 206 msec, QTc 442 msec  Assessment and Plan: 1. Persistent atrial fibrillation AF burden by ILR is 11.4% (previously 3%). She was in afib most of the time prior to ablation 04/25/19.  In the absence of significant weight loss, I am not convinced that we can further improve her afib.  I am quite please with her overall afib reduction post ablation (previously her afib was quite refractory). She is on tikosyn with stable QT.  She had prior concerns for  fibrosis with amiodarone.  For now, I would advise lifestyle modification including exercise and weight loss. We discussed risks of ablation and will continue medical therapy at this time. Labs 9/21 reviewed She is on eliquis for stroke prevention.  chads2vasc score is 6.  2. HTN Stable No change required today  3. OSA Compliant with CPAP  4. Obesity Body mass index is 49.21 kg/m. Lifestyle modification is advised Risks, benefits and potential toxicities for medications prescribed and/or refilled reviewed with patient today.   5. Chronic diastolic dysfunction Stable No change required today   AF clinic in 3 months I will see in 6 months  [name] MD, [facility] 06/10/2020  11:18 AM

## 2020-06-10 NOTE — Patient Instructions (Signed)
Medication Instructions:  Your physician recommends that you continue on your current medications as directed. Please refer to the Current Medication list given to you today.  *If you need a refill on your cardiac medications before your next appointment, please call your pharmacy*  Lab Work: None ordered.  If you have labs (blood work) drawn today and your tests are completely normal, you will receive your results only by: Marland Kitchen MyChart Message (if you have MyChart) OR . A paper copy in the mail If you have any lab test that is abnormal or we need to change your treatment, we will call you to review the results.  Testing/Procedures: None ordered.  Follow-Up: At [facility], you and your health needs are our priority.  As part of our continuing mission to provide you with exceptional heart care, we have created designated Provider Care Teams.  These Care Teams include your primary Cardiologist (physician) and Advanced Practice Providers (APPs -  Physician Assistants and Nurse Practitioners) who all work together to provide you with the care you need, when you need it.  We recommend signing up for the patient portal called "MyChart".  Sign up information is provided on this After Visit Summary.  MyChart is used to connect with patients for Virtual Visits (Telemedicine).  Patients are able to view lab/test results, encounter notes, upcoming appointments, etc.  Non-urgent messages can be sent to your provider as well.   To learn more about what you can do with MyChart, go to [email or web address].    Your next appointment:   Your physician wants you to follow-up in: 3 months with the Afib Clinic.  6 months with Dr. Rayann Heman.  You will receive a reminder letter in the mail two months in advance. If you don't receive a letter, please call our office to schedule the follow-up appointment.    Other Instructions:

## 2020-06-20 ENCOUNTER — Other Ambulatory Visit (HOSPITAL_BASED_OUTPATIENT_CLINIC_OR_DEPARTMENT_OTHER): Payer: Self-pay | Admitting: Family Medicine

## 2020-06-20 DIAGNOSIS — Z1231 Encounter for screening mammogram for malignant neoplasm of breast: Secondary | ICD-10-CM

## 2020-06-24 ENCOUNTER — Telehealth: Payer: Self-pay | Admitting: Pharmacist

## 2020-06-24 NOTE — Progress Notes (Addendum)
Chronic Care Management Pharmacy Assistant   Name: Tracy Miles  MRN: [id number] DOB: 05-10-1934  Reason for Encounter: Medication Review  Patient Questions:  1.  Have you seen any other providers since your last visit? Yes  2.  Any changes in your medicines or health? No    PCP : [name], MD   Their chronic conditions include: Hypertension, Hyperlipidemia, Pre-Diabetes, Afib, Heart Failure, Erosive Lichen Planus  Office Visits: 05-14-20(PCP) Patient presented in office with Tracy Miles for annual exam.  Labs ordered.  Med.  Provider changed Apixaban and Bumetanide medication. Consults: 06-10-20(Cardiology) Patient is presented in office with Tracy Miles for a follow up.   05-17-20(Cardiology) Patient is presented in office with Tracy Miles for electrophysiology follow up.  No medication changes 05-02-20(Cardiology) Patient is presented in office with Tracy Miles for electrophysiology follow up. No medication changes  No Hospitalizations since last CCM visit on 04-17-20.  Allergies:   Allergies  Allergen Reactions  . Gabapentin Swelling  . Lactose Intolerance (Gi) Other (See Comments)    Bothers her stomach  . Zebeta [Bisoprolol Fumarate] Nausea Only  . Penicillins Hives and Rash    Did it involve swelling of the face/tongue/throat, SOB, or low BP? No Did it involve sudden or severe rash/hives, skin peeling, or any reaction on the inside of your mouth or nose? Yes Did you need to seek medical attention at a hospital or doctor's office? No When did it last happen? 25+ years If all above answers are "NO", may proceed with cephalosporin use.   . Sulfa Antibiotics Rash    Medications: Outpatient Encounter Medications as of 06/24/2020  Medication Sig  . acetaminophen (TYLENOL) 325 MG tablet Take 325 mg by mouth 2 (two) times daily as needed (pain.).   Marland Kitchen apixaban (ELIQUIS) 5 MG TABS tablet Take 1 tablet (5 mg total) by mouth in the morning and at  bedtime.  Marland Kitchen atorvastatin (LIPITOR) 20 MG tablet TAKE 1 TABLET BY MOUTH  DAILY AT 6 PM  . Biotin 5000 MCG CAPS Take 5,000 mcg by mouth daily.   . bumetanide (BUMEX) 1 MG tablet Take 1 tablet (1 mg total) by mouth daily.  . Calcium Carb-Cholecalciferol (CALCIUM 600 + D PO) Take 1 tablet by mouth 2 (two) times daily.  . Carboxymethylcellul-Glycerin (LUBRICATING EYE DROPS OP) Place 1 drop into both eyes in the morning and at bedtime.   Marland Kitchen diltiazem (CARDIZEM CD) 120 MG 24 hr capsule Take 1 capsule (120 mg total) by mouth daily.  Marland Kitchen dofetilide (TIKOSYN) 250 MCG capsule TAKE ONE (1) CAPSULE BY MOUTH 2 TIMES DAILY  . lisinopril (ZESTRIL) 10 MG tablet Take 1 tablet (10 mg total) by mouth daily.  Marland Kitchen loratadine (CLARITIN) 10 MG tablet Take 10 mg by mouth daily.   . metoprolol succinate (TOPROL-XL) 25 MG 24 hr tablet TAKE 1 AND 1/2 TABLETS BY MOUTH TWICE DAILY  . Multiple Vitamins-Minerals (ONE-A-DAY WOMENS 50 PLUS PO) Take 1 tablet by mouth daily.  . potassium chloride (KLOR-CON) 10 MEQ tablet TAKE 1 TABLET BY MOUTH IN  THE MORNING AND 2 TABLETS  IN THE EVENING  . Probiotic CAPS Take 1 capsule by mouth daily.  . psyllium (REGULOID) 0.52 g capsule Take 0.52 g by mouth 2 (two) times daily.    No facility-administered encounter medications on file as of 06/24/2020.    Current Diagnosis: Patient Active Problem List   Diagnosis Date Noted  . History of oral lesions 05/15/2020  . Persistent atrial fibrillation ([place])  04/25/2019  . Pulmonary fibrosis, unspecified ([place]) 04/11/2019  . Abnormal chest x-ray 03/20/2019  . 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 Hyperglycemia 11/22/2017  . Urinary urgency 11/22/2017  . Pedal edema 08/28/2016  . Medicare annual wellness visit, subsequent 09/15/2015  . Visit for monitoring Tikosyn therapy 08/11/2015  . Chronic diastolic HF (heart failure) ([place]) 08/11/2015  . Obstructive sleep apnea 02/22/2015  . Spinal stenosis of lumbar region  04/02/2014  . Urinary tract infection with hematuria 03/26/2014  . Personal history of colonic polyps 02/25/2014  . Decreased visual acuity 02/25/2014  . Benign paroxysmal positional vertigo 12/17/2013  . Abnormal results of thyroid function studies 12/17/2013  . History of DVT (deep vein thrombosis) 11/19/2013  . Allergic state 11/19/2013  . OA (osteoarthritis) 11/19/2013  . Constipation 11/16/2013  . Cervical disc disease 11/16/2013  . Obesity   . Iron deficiency anemia 11/13/2013  . Fatigue 11/13/2013  . Hyperlipidemia, mixed 10/19/2013  . Atrial fibrillation ([place]) 10/04/2013  . Essential hypertension 10/04/2013    Goals Addressed   None    Reviewed chart prior to disease state call. Spoke with patient regarding BP  Recent Office Vitals: BP Readings from Last 3 Encounters:  06/10/20 (!) 172/86  05/17/20 (!) 160/90  05/14/20 118/72   Pulse Readings from Last 3 Encounters:  06/10/20 75  05/17/20 67  05/14/20 91    Wt Readings from Last 3 Encounters:  06/10/20 277 lb 12.8 oz (126 kg)  05/17/20 277 lb (125.6 kg)  05/14/20 275 lb 12.8 oz (125.1 kg)     Kidney Function Lab Results  Component Value Date/Time   CREATININE 1.18 (H) 05/14/2020 02:13 PM   CREATININE 1.20 (H) 05/02/2020 02:02 PM   CREATININE 0.91 11/07/2019 02:08 PM   CREATININE 0.98 (H) 10/20/2018 04:04 PM   GFR 58.96 (L) 11/07/2019 02:08 PM   GFRNONAA 41 (L) 05/02/2020 02:02 PM   GFRAA 48 (L) 05/02/2020 02:02 PM    BMP Latest Ref Rng & Units 05/14/2020 05/02/2020 11/07/2019  Glucose 65 - 99 mg/dL 106(H) 136(H) 94  BUN 7 - 25 mg/dL 17 20 18   Creatinine 0.60 - 0.88 mg/dL 1.18(H) 1.20(H) 0.91  BUN/Creat Ratio 6 - 22 (calc) 14 - -  Sodium 135 - 146 mmol/L 139 138 139  Potassium 3.5 - 5.3 mmol/L 4.9 4.7 4.4  Chloride 98 - 110 mmol/L 99 101 98  CO2 20 - 32 mmol/L 28 25 32  Calcium 8.6 - 10.4 mg/dL 10.4 9.4 9.6    . Current antihypertensive regimen:   Lisinopril 10mg  daily  Metoprolol succinate 25mg   1.5 tabs twice daily  Diltiazem ER 120mg  daily . How often are you checking your Blood Pressure? infrequently . Current home BP readings: None . What recent interventions/DTPs have been made by any provider to improve Blood Pressure control since last CPP Visit: None . Any recent hospitalizations or ED visits since last visit with CPP? No . What diet changes have been made to improve Blood Pressure Control?  o States she does not use any salt.  She uses [employer] . What exercise is being done to improve your Blood Pressure Control?  o States she goes to the pool three times a week and walk in the pool for an hour.  Adherence Review: Is the patient currently on ACE/ARB medication? Yes Lisinopril 10mg  daily Does the patient have >5 day gap between last estimated fill dates? No    Follow-Up:  Pharmacist Review   [place]  [facility] [place] Pharmacist Assistant [phone]  Reviewed by: De Blanch, PharmD Clinical Pharmacist [facility] [phone]

## 2020-07-07 LAB — CUP PACEART REMOTE DEVICE CHECK
Date Time Interrogation Session: 20211106230152
Implantable Pulse Generator Implant Date: 20210322

## 2020-07-08 ENCOUNTER — Ambulatory Visit (INDEPENDENT_AMBULATORY_CARE_PROVIDER_SITE_OTHER): Payer: Medicare Other

## 2020-07-08 DIAGNOSIS — I480 Paroxysmal atrial fibrillation: Secondary | ICD-10-CM | POA: Diagnosis not present

## 2020-07-09 NOTE — Progress Notes (Signed)
Carelink Summary Report / Loop Recorder 

## 2020-07-22 ENCOUNTER — Ambulatory Visit: Payer: Medicare Other | Admitting: Pharmacist

## 2020-07-22 DIAGNOSIS — R739 Hyperglycemia, unspecified: Secondary | ICD-10-CM

## 2020-07-22 DIAGNOSIS — E782 Mixed hyperlipidemia: Secondary | ICD-10-CM

## 2020-07-22 DIAGNOSIS — I10 Essential (primary) hypertension: Secondary | ICD-10-CM

## 2020-07-22 NOTE — Patient Instructions (Addendum)
Visit Information  Goals Addressed            This Visit's Progress   . Chronic Care Management Pharmacy Care Plan       CARE PLAN ENTRY (see longitudinal plan of care for additional care plan information)  Current Barriers:  . Chronic Disease Management support, education, and care coordination needs related to Hypertension, Hyperlipidemia, Pre-Diabetes, Afib, Heart Failure, Erosive Lichen Planus   Hypertension BP Readings from Last 3 Encounters:  06/10/20 (!) 172/86  05/17/20 (!) 160/90  05/14/20 118/72   . Pharmacist Clinical Goal(s): o Over the next 90 days, patient will work with PharmD and providers to maintain BP goal <140/90 . Current regimen:  . Lisinopril 10mg  daily . Metoprolol succinate 25mg  1.5 tabs twice daily . Diltiazem ER 120mg  daily . Interventions: o Discussed BP goal . Patient self care activities - Over the next 90 days, patient will: o Maintain hypertension medication regimen.   Hyperlipidemia Lab Results  Component Value Date/Time   LDLCALC 85 05/14/2020 02:13 PM   . Pharmacist Clinical Goal(s): o Over the next 90 days, patient will work with PharmD and providers to maintain LDL goal < 100 . Current regimen:  o Atorvastatin 20mg  daily  . Interventions: o Discussed LDL goal . Patient self care activities - Over the next 90 days, patient will: o Maintain cholesterol medication regimen.   Pre-Diabetes Lab Results  Component Value Date/Time   HGBA1C 5.6 05/14/2020 02:13 PM   HGBA1C 5.9 11/07/2019 02:08 PM   . Pharmacist Clinical Goal(s): o Over the next 90 days, patient will work with PharmD and providers to maintain A1c goal <6.5% . Current regimen:  o Diet and exercise management   . Interventions: o Discussed the importance of limiting carbohydrates (30-45grams per meal) o A1c stable/Improved. Congratulated patient on this. . Patient self care activities - Over the next 90 days, patient will: o Consider limiting carbs to 30-45 grams  per meal o Maintain E3X <5.4%  Erosive Lichen Planus . Pharmacist Clinical Goal(s) o Over the next 90 days, patient will work with PharmD and providers to reduce symptoms associated with condition (Condition currently resolved) . Current regimen:  o Chief of Staff . Interventions: o Resources provided regarding condition . Patient self care activities - Over the next 90 days, patient will: o Maintain medication regimen to manage condition  Medication management . Pharmacist Clinical Goal(s): o Over the next 90 days, patient will work with PharmD and providers to maintain optimal medication adherence . Current pharmacy: [facility] . Interventions o Comprehensive medication review performed. o Continue current medication management strategy . Patient self care activities - Over the next 90 days, patient will: o Focus on medication adherence by filling and taking medications appropriately  o Take medications as prescribed o Report any questions or concerns to PharmD and/or provider(s)  Please see past updates related to this goal by clicking on the "Past Updates" button in the selected goal         The patient verbalized understanding of instructions, educational materials, and care plan provided today and declined offer to receive copy of patient instructions, educational materials, and care plan.   Telephone follow up appointment with pharmacy team member scheduled for: 10/25/20  De Blanch, PharmD Clinical Pharmacist [facility] [phone]

## 2020-07-22 NOTE — Chronic Care Management (AMB) (Signed)
Chronic Care Management Pharmacy  Name: Tracy Miles  MRN: [id number] DOB: Jan 23, 1934  Chief Complaint/ HPI  Tracy Miles,  86 y.o. , female presents for their Follow-Up CCM visit with the clinical pharmacist via telephone.  PCP : [name], MD  Their chronic conditions include: Hypertension, Hyperlipidemia, Pre-Diabetes, Afib, Heart Failure, Erosive Lichen Planus  Office Visits: 05/14/20: Visit w/ Dr. Charlett Blake -  No med changes noted.   Consult Visit: 06/10/20: Cardio visit w/ Dr. Rayann Heman - Afib burden increased from 3% to 11.4% by ILR. Recommend exercise and weight loss. Will continue medical therapy vs ablation at this time. RTC Afib clinic in 3 months and cardio in 6 months.   05/17/20: Afib clinic w/ [name], NP - Pt increased metoprolol to 20m BID when she went out of rhythm week prior, then went back to 37.565mBID. Would like to consider ablation due to fatigue with increased BB dose and Afib symptoms. Refer to Dr. AlRayann Hemanor ablation discussion.  Medications: Outpatient Encounter Medications as of 07/22/2020  Medication Sig  . acetaminophen (TYLENOL) 325 MG tablet Take 325 mg by mouth 2 (two) times daily as needed (pain.).   . Marland Kitchenpixaban (ELIQUIS) 5 MG TABS tablet Take 1 tablet (5 mg total) by mouth in the morning and at bedtime.  . Marland Kitchentorvastatin (LIPITOR) 20 MG tablet TAKE 1 TABLET BY MOUTH  DAILY AT 6 PM  . Biotin 5000 MCG CAPS Take 5,000 mcg by mouth daily.   . bumetanide (BUMEX) 1 MG tablet Take 1 tablet (1 mg total) by mouth daily.  . Calcium Carb-Cholecalciferol (CALCIUM 600 + D PO) Take 1 tablet by mouth 2 (two) times daily.  . Carboxymethylcellul-Glycerin (LUBRICATING EYE DROPS OP) Place 1 drop into both eyes in the morning and at bedtime.   . Marland Kitcheniltiazem (CARDIZEM CD) 120 MG 24 hr capsule Take 1 capsule (120 mg total) by mouth daily.  . Marland Kitchenofetilide (TIKOSYN) 250 MCG capsule TAKE ONE (1) CAPSULE BY MOUTH 2 TIMES DAILY  . lisinopril (ZESTRIL) 10 MG tablet  Take 1 tablet (10 mg total) by mouth daily.  . Marland Kitchenoratadine (CLARITIN) 10 MG tablet Take 10 mg by mouth daily.   . metoprolol succinate (TOPROL-XL) 25 MG 24 hr tablet TAKE 1 AND 1/2 TABLETS BY MOUTH TWICE DAILY  . Multiple Vitamins-Minerals (ONE-A-Tywanna Seifer WOMENS 50 PLUS PO) Take 1 tablet by mouth daily.  . potassium chloride (KLOR-CON) 10 MEQ tablet TAKE 1 TABLET BY MOUTH IN  THE MORNING AND 2 TABLETS  IN THE EVENING  . Probiotic CAPS Take 1 capsule by mouth daily.  . psyllium (REGULOID) 0.52 g capsule Take 0.52 g by mouth 2 (two) times daily.    No facility-administered encounter medications on file as of 07/22/2020.   SDOH Screenings   Alcohol Screen:   . Last Alcohol Screening Score (AUDIT): Not on file  Depression (PHQ2-9): Low Risk   . PHQ-2 Score: 0  Financial Resource Strain: Low Risk   . Difficulty of Paying Living Expenses: Not hard at all  Food Insecurity:   . Worried About RuCharity fundraisern the Last Year: Not on file  . Ran Out of Food in the Last Year: Not on file  Housing:   . Last Housing Risk Score: Not on file  Physical Activity:   . Days of Exercise per Week: Not on file  . Minutes of Exercise per Session: Not on file  Social Connections:   . Frequency of Communication with Friends and Family: Not on  file  . Frequency of Social Gatherings with Friends and Family: Not on file  . Attends Religious Services: Not on file  . Active Member of Clubs or Organizations: Not on file  . Attends Archivist Meetings: Not on file  . Marital Status: Not on file  Stress:   . Feeling of Stress : Not on file  Tobacco Use: Low Risk   . Smoking Tobacco Use: Never Smoker  . Smokeless Tobacco Use: Never Used  Transportation Needs:   . Film/video editor (Medical): Not on file  . Lack of Transportation (Non-Medical): Not on file     Current Diagnosis/Assessment:  Goals Addressed            This Visit's Progress   . Chronic Care Management Pharmacy Care Plan        CARE PLAN ENTRY (see longitudinal plan of care for additional care plan information)  Current Barriers:  . Chronic Disease Management support, education, and care coordination needs related to Hypertension, Hyperlipidemia, Pre-Diabetes, Afib, Heart Failure, Erosive Lichen Planus   Hypertension BP Readings from Last 3 Encounters:  06/10/20 (!) 172/86  05/17/20 (!) 160/90  05/14/20 118/72   . Pharmacist Clinical Goal(s): o Over the next 90 days, patient will work with PharmD and providers to maintain BP goal <140/90 . Current regimen:  . Lisinopril 39m daily . Metoprolol succinate 256m1.5 tabs twice daily . Diltiazem ER 12078maily . Interventions: o Discussed BP goal . Patient self care activities - Over the next 90 days, patient will: o Maintain hypertension medication regimen.   Hyperlipidemia Lab Results  Component Value Date/Time   LDLCALC 85 05/14/2020 02:13 PM   . Pharmacist Clinical Goal(s): o Over the next 90 days, patient will work with PharmD and providers to maintain LDL goal < 100 . Current regimen:  o Atorvastatin 75m68mily  . Interventions: o Discussed LDL goal . Patient self care activities - Over the next 90 days, patient will: o Maintain cholesterol medication regimen.   Pre-Diabetes Lab Results  Component Value Date/Time   HGBA1C 5.6 05/14/2020 02:13 PM   HGBA1C 5.9 11/07/2019 02:08 PM   . Pharmacist Clinical Goal(s): o Over the next 90 days, patient will work with PharmD and providers to maintain A1c goal <6.5% . Current regimen:  o Diet and exercise management   . Interventions: o Discussed the importance of limiting carbohydrates (30-45grams per meal) o A1c stable/Improved. Congratulated patient on this. . Patient self care activities - Over the next 90 days, patient will: o Consider limiting carbs to 30-45 grams per meal o Maintain a1c I9S5<8.5%osive Lichen Planus . Pharmacist Clinical Goal(s) o Over the next 90 days, patient will  work with PharmD and providers to reduce symptoms associated with condition (Condition currently resolved) . Current regimen:  o MagiChief of Staffnterventions: o Resources provided regarding condition . Patient self care activities - Over the next 90 days, patient will: o Maintain medication regimen to manage condition  Medication management . Pharmacist Clinical Goal(s): o Over the next 90 days, patient will work with PharmD and providers to maintain optimal medication adherence . Current pharmacy: [facility] . Interventions o Comprehensive medication review performed. o Continue current medication management strategy . Patient self care activities - Over the next 90 days, patient will: o Focus on medication adherence by filling and taking medications appropriately  o Take medications as prescribed o Report any questions or concerns to PharmD and/or provider(s)  Please see past updates related to this goal by clicking on the "Past Updates" button in the selected goal          Hypertension   BP goal is:  <140/90  Office blood pressures are  BP Readings from Last 3 Encounters:  06/10/20 (!) 172/86  05/17/20 (!) 160/90  05/14/20 118/72   Patient checks BP at home infrequently Patient home BP readings are ranging: Unable to assess  Patient has failed these meds in the past: None noted  Patient is currently controlled on the following medications:  . Lisinopril 55m daily (has taken "forever") . Metoprolol succinate 221m1.5 tabs twice daily . Diltiazem ER 12033maily  We discussed BP goal   Update 11/22/_0 Avg 138.3 75.3 71.6 Denies chest pain, headache, or dizziness  Plan -Continue current medications     Hyperlipidemia   LDL goal <100  Lipid Panel     Component Value Date/Time   CHOL 150 05/14/2020 1413   CHOL 138 04/20/2019 1345   TRIG 142 05/14/2020 1413   HDL 41 (L) 05/14/2020 1413   HDL 41  04/20/2019 1345   LDLCALC 85 05/14/2020 1413    Hepatic Function Latest Ref Rng & Units 05/14/2020 11/07/2019 04/20/2019  Total Protein 6.1 - 8.1 g/dL 6.9 7.0 6.8  Albumin 3.5 - 5.2 g/dL - 4.2 4.5  AST 10 - 35 U/L _1 ALT 6 - 29 U/L _2 Alk Phosphatase 39 - 117 U/L - 116 99  Total Bilirubin 0.2 - 1.2 mg/dL 0.8 0.7 1.0  Bilirubin, Direct 0.0 - 0.3 mg/dL - - -     The ASCVD Risk score (GofAttallaet al., 2013) failed to calculate for the following reasons:   The 2013 ASCVD risk score is only valid for ages 40 18 79 87Patient has failed these meds in past: None noted  Patient is currently controlled on the following medications:  . Atorvastatin 6m36mily   We discussed:  LDL goal   Update 07/22/20 LDL still at goal  Plan -Continue current medications  Pre-Diabetes   A1c goal <6.5%  Recent Relevant Labs: Lab Results  Component Value Date/Time   HGBA1C 5.6 05/14/2020 02:13 PM   HGBA1C 5.9 11/07/2019 02:08 PM   GFR 58.96 (L) 11/07/2019 02:08 PM   GFR 55.77 (L) 04/05/2018 01:57 PM    Patient has failed these meds in past: None noted  Patient is currently controlled on the following medications: . None  We discussed: diet and exercise extensively   Exercise Walks in heated pool 3 times per week at [employer] fiber diet. "I love salads"  Discussed: consideration of carbohydrate limitation to 30-45 grams per meal and goal to keep a1c <6.5%   Update 07/22/20 A1c stable and improved. Congratulated patient on this. She is now technically outside of the pre-diabetes range.   Plan -Continue control with diet and exercise  -Consider limiting carbohydrates to 30-45 grams per meal  AFIB   Patient is currently rate and rhythm controlled.  Patient has failed these meds in past: amiodarone (lung toxicity), verapamil (D/C when tikosyn started), Xarelto (anemia per pt), warfarin (labile INR) Patient is currently controlled on the  following medications:   Diltiazem ER 16mg35mly  Dofetilide 250mcg59mce daily  Eliquis 5mg tw37m daily  Metoprolol succinate 25mg 1.57mbs twice daily  Has had significant difficulty with  afib. Discussed indication for each medication and maintaining her current stability. Although patient would like to take fewer medications she realizes the importance of her current regimen.  States she avoids caffeine and alcohol.   Update 07/22/20 Decision to proceed with medical management vs ablation per most recent cardio note.  Plan -Continue current medications   Heart Failure   Type: Systolic  Last ejection fraction: 04/19/2019 EF 45-50% NYHA Class: II (slight limitation of activity) more likely Afib related AHA HF Stage: C (Heart disease and symptoms present)  Patient has failed these meds in past: None noted  Patient is currently controlled on the following medications:  Bumetanide 42m daily  Potassium Chliride 146m 1 tab AM and 2 tab PM  Metoprolol succinate 2528m.5 tabs twice daily (has only been taking for a year)  She notes slight edema today. She states R ankle swells more than left, but today's level is at baseline. Denies any significant SOB. States she weighs daily.  We discussed weighing daily; if you gain more than 3 pounds in one Deaven Barron or 5 pounds in one week call your doctor   Update 07/22/20 Edema? No more than baseline SOB? No  Plan -Continue current medications   Erosive Lichen Planus    Patient has failed these meds in past: None noted  Patient is currently controlled on the following medications: . MMarland Kitchengic Mouth Wash occassionally  Problem Story Was told by oral surgeon that patient had this diagnosis. States that magic mouth wash does not help much. She has had ulcers off an on all her life, but it has become more significant May 2021. Can't recall anything new that has happened since May. Wonders if medications could be contributing.  States  she provided her medication list to oral surgeon who did not report that any of her medications would be the cause. Will explore medication list and other probable causes of erosive lichen planus.   She wonders if she has a metal allergy.   Only new med change was increase dose in metoprolol in June. Went to dentist on 5/21 for cleaning, then 6/9 for partial repair when she broke the partial in her mouth. She wonders if any food she eats could be contributing.  Update After Visit Researched after visit and discovered that ACEI (captopril, enalapril) and beta blockers (propranolol, labetalol, sotalol, metoprolol) can cause lichenoid eruptions (drug-induced lichen planus). The lag/latency period ranges from one month to two years. Noting that patient started metoprolol last year, and she had recent dose increase, it is probable that metoprolol could be cause. Called patient back to discuss my findings. Encouraged patient to discuss this with Dr. AllRayann Hemanould consider changing metoprolol to carvedilol as no significant findings initially found implicating carvedilol with caused erosive lichen planus.  Carvedilol  Pros: good for HR, BP, HF Cons: Not B1 selective and could affect B2 and therefore effect lung  Update 07/22/20 Discussed this with Dr. AllRayann Hemanes, did not suspect metoprolol was the cause States she took abx before a dental procedure and this cleared up her mouth sores.  Plan -Continue current management  Vaccines/Health Maintenance   Reviewed and discussed patient's vaccination history.  Patient up to date on all vaccines.  Immunization History  Administered Date(s) Administered  . Fluad Quad(high Dose 65+) 06/16/2019  . Influenza, High Dose Seasonal PF 05/11/2017, 05/24/2018  . Influenza,inj,Quad PF,6+ Mos 04/30/2016  . Influenza-Unspecified 05/31/2013, 05/01/2014, 06/11/2015  . Moderna SARS-COVID-2 Vaccination 09/13/2019, 10/10/2019  . Pneumococcal Conjugate-13 04/02/2014   .  Pneumococcal Polysaccharide-23 08/31/1998, 09/06/2015  . Tdap 07/31/2012  . Zoster Recombinat (Shingrix) 04/19/2018, 06/27/2018   Received covid vaccine booster on 07/03/20 at [facility]  Patient makes note that she would like to refuse any further colonoscopy. She states she does not have a family history and eats a high fiber diet.  Noting pt's age, agree with this decision.   Miscellaneous Meds Acetaminophen 335m  Biotin 50077m  Loratadine 1042mrobiotic Psyllium 0.52g  Lubricating eye drops  Medication Management   Pt uses [facility] for all medications Uses pill box? Yes Pt endorses 100% compliance  We discussed: Option of [facility]. Pt likes that [facility] to [employer]ured that [facility] could as well including the ability to provide medication synchronization and adherence packaging. Pt likes her current system. She does [facility] she hits the donut hole around June each year. Can inquire about [facility] possible copays noting she does also fill some meds through mail order and this would probably be the most cost efficient for patient.   Plan -Continue current medication management strategy  Follow up: 3 month phone visit  KanDe BlanchharmD Clinical Pharmacist [phone]

## 2020-08-10 LAB — CUP PACEART REMOTE DEVICE CHECK
Date Time Interrogation Session: 20211210003136
Implantable Pulse Generator Implant Date: 20210322

## 2020-08-12 ENCOUNTER — Ambulatory Visit (INDEPENDENT_AMBULATORY_CARE_PROVIDER_SITE_OTHER): Payer: Medicare Other

## 2020-08-12 DIAGNOSIS — I4819 Other persistent atrial fibrillation: Secondary | ICD-10-CM

## 2020-08-15 ENCOUNTER — Telehealth: Payer: Self-pay

## 2020-08-15 NOTE — Telephone Encounter (Signed)
ILR alert received for AF w/ RVR, longest duration 13 hours with a max VR of 133 bpm. Patient reports she ate lunch and felt fatigue and "shaky". States she laid down, when she woke up she knew she was in AF. States she feels the same as today. Patient took 50 mg of Toprol- XL last night and 50 mg Toprol- XL this morning (patient states she was told at a previous visit to increase medication while episode was in progress). Patient states this is the way she normally feels while in AF and she keeps track of when she goes in and out. Advised patient per Dr. Jackalyn Lombard last note, he avdises lifestyle modifications including exercise and weight loss. Patient verbalized understanding. Advised patient to continue to monitor at this time, previously noted elevated rates. Unfortunately, we are not able to receive manual transmission to assess presenting d/t ILR II. We will recheck presenting to check rhythm. Patient advised to call with questions, concerns or worsening symptoms. Verbalized understanding.  Per EMR: Eliquis 5 mg BID, Cardizem CD 120 mg daily, Tikosyn 250 mcg BID, Toprol- XL 25 mg (1 1/2) BID.

## 2020-08-19 NOTE — Telephone Encounter (Signed)
  Presenting AF w/ rates 100-150's. Routing to Dr. Rayann Heman for review and recommendations.

## 2020-08-20 ENCOUNTER — Ambulatory Visit (HOSPITAL_BASED_OUTPATIENT_CLINIC_OR_DEPARTMENT_OTHER): Payer: Medicare Other

## 2020-08-27 NOTE — Progress Notes (Signed)
Carelink Summary Report / Loop Recorder 

## 2020-08-27 NOTE — Telephone Encounter (Signed)
She has scheduled follow-up in AF clinic 09/12/20

## 2020-09-12 ENCOUNTER — Encounter (HOSPITAL_COMMUNITY): Payer: Self-pay | Admitting: Nurse Practitioner

## 2020-09-12 ENCOUNTER — Other Ambulatory Visit: Payer: Self-pay

## 2020-09-12 ENCOUNTER — Ambulatory Visit (HOSPITAL_COMMUNITY)
Admission: RE | Admit: 2020-09-12 | Discharge: 2020-09-12 | Disposition: A | Discharge status: Home / Self Care | Payer: Medicare Other | Source: Ambulatory Visit | Attending: Nurse Practitioner | Admitting: Nurse Practitioner

## 2020-09-12 VITALS — BP 146/74 | HR 69 | Ht 63.0 in | Wt 275.4 lb

## 2020-09-12 DIAGNOSIS — Z79899 Other long term (current) drug therapy: Secondary | ICD-10-CM | POA: Insufficient documentation

## 2020-09-12 DIAGNOSIS — Z7901 Long term (current) use of anticoagulants: Secondary | ICD-10-CM | POA: Insufficient documentation

## 2020-09-12 DIAGNOSIS — I4819 Other persistent atrial fibrillation: Secondary | ICD-10-CM | POA: Diagnosis not present

## 2020-09-12 DIAGNOSIS — I4892 Unspecified atrial flutter: Secondary | ICD-10-CM | POA: Diagnosis not present

## 2020-09-12 DIAGNOSIS — G4733 Obstructive sleep apnea (adult) (pediatric): Secondary | ICD-10-CM | POA: Insufficient documentation

## 2020-09-12 DIAGNOSIS — I10 Essential (primary) hypertension: Secondary | ICD-10-CM | POA: Insufficient documentation

## 2020-09-12 DIAGNOSIS — D6869 Other thrombophilia: Secondary | ICD-10-CM

## 2020-09-12 LAB — BASIC METABOLIC PANEL
Anion gap: 10 (ref 5–15)
BUN: 17 mg/dL (ref 8–23)
CO2: 29 mmol/L (ref 22–32)
Calcium: 9.7 mg/dL (ref 8.9–10.3)
Chloride: 98 mmol/L (ref 98–111)
Creatinine, Ser: 0.99 mg/dL (ref 0.44–1.00)
GFR, Estimated: 56 mL/min — ABNORMAL LOW (ref >60)
Glucose, Bld: 109 mg/dL — ABNORMAL HIGH (ref 70–99)
Potassium: 4 mmol/L (ref 3.5–5.1)
Sodium: 137 mmol/L (ref 135–145)

## 2020-09-12 LAB — MAGNESIUM: Magnesium: 2 mg/dL (ref 1.7–2.4)

## 2020-09-12 MED ORDER — METOPROLOL SUCCINATE ER 25 MG PO TB24: 37.5000 mg | ORAL_TABLET | Freq: Two times a day (BID) | ORAL | 2 refills | Status: DC

## 2020-09-12 MED ORDER — APIXABAN 5 MG PO TABS: 5.0000 mg | ORAL_TABLET | Freq: Two times a day (BID) | ORAL | 1 refills | Status: DC

## 2020-09-12 MED ORDER — POTASSIUM CHLORIDE ER 10 MEQ PO TBCR: EXTENDED_RELEASE_TABLET | ORAL | 3 refills | Status: DC

## 2020-09-12 MED ORDER — DOFETILIDE 250 MCG PO CAPS: ORAL_CAPSULE | ORAL | 8 refills | Status: DC

## 2020-09-12 NOTE — Progress Notes (Signed)
PCP: [name], MD  [name] is a 87 y.o. female who presents today for  electrophysiology follow up afib ablation 6 weeks ago. She had developed medicine refractory atrial fibrillation. She had  hematoma of the abdomen and both groins that gradually cleared. She continued  on tikosyn.  She has had intermittent afib since the procedure and was in afib  at one month f/u. She continued to watch her heart rates for the last couple of weeks and is consistently out of rhythm. She has been able to return to exercising in the pool and this has made her feel better. Her v rates are quite elevated in the 120's. Higher does of BB in the past have not helped and increased dose of diltiazem increases her swelling.  No swallowing issues since the procedure.   F/u in afib clinic,10/15, she is s/p successful cardioversion and remains in SR. She is very happy about this and is feeling much improved.   F/u in afib clinic, 05/02/20. She has received a Linq since I saw her last, and the device clinic let her know yesterday that she was out of rhythm. EKG today shows atrial flutter at 124 ms. She continues on eliquis and tikosyn. CHA2DS2VASc score is  6.No trigger that pt is aware of. She did increase metoprolol to 50 mg bid. We scheduled for a cardioversion but she self converted several days after I saw here.  F/u in afib clinic, 9/17. She called [place] last week as she went out of rhythm again and she increased her BB to 50 mg bid. After several days she converted. She  in rhythm today. She has gone back to 37.5 mg bid . She would like to discuss possibility of repeat ablation as the extra BB and being in afib often recently is making her more fatigued. She has not tolerated amiodarone  in the past for evidence of pulmonary fibrosis.   F/u in afib clinic, 09/12/20. Overall, she has done well since last visit here with less afib burden. One prolonged episode, can usually control with extra  metoprolol. In SR today.   Today, she denies symptoms of palpitations, chest pain,   lower extremity edema, dizziness, presyncope, or syncope.  The patient is otherwise without complaint today.   Past Medical History:  Diagnosis Date  . Anemia   . Aortic stenosis    Very mild  . Benign paroxysmal positional vertigo 12/17/2013  . Chicken pox as a child  . Colon polyp   . Coronary artery disease   . DDD (degenerative disc disease) 11/16/2013  . Hyperlipidemia   . Hypertension   . Iron deficiency anemia 11/13/2013  . Mumps child and teenager  . OA (osteoarthritis) 11/19/2013   S/p L TKR  . Obesity   . OSA on CPAP   . Pancreatitis    post hysterectomy  . Persistent atrial fibrillation ([place])   . Personal history of colonic polyps 02/25/2014  . Personal history of DVT (deep vein thrombosis) X 2   "left"  . Sleep apnea   . Spinal stenosis    Past Surgical History:  Procedure Laterality Date  . APPENDECTOMY    . ATRIAL FIBRILLATION ABLATION N/A 04/25/2019   Procedure: ATRIAL FIBRILLATION ABLATION;  Surgeon: [name], MD;  Location: [facility];  Service: Cardiovascular;  Laterality: N/A;  . CARDIOVERSION N/A 08/09/2015   Procedure: CARDIOVERSION;  Surgeon: Jerline Pain, MD;  Location: [facility];  Service:  Cardiovascular;  Laterality: N/A;  . CARDIOVERSION N/A 09/30/2018   Procedure: CARDIOVERSION;  Surgeon: [name], MD;  Location[facility] ENDOSCOPY;  Service: Cardiovascular;  Laterality: N/A;  . CARDIOVERSION N/A 02/27/2019   Procedure: CARDIOVERSION;  Surgeon: Buford Dresser, MD;  Location: [facility] ENDOSCOPY;  Service: Cardiovascular;  Laterality: N/A;  . CARDIOVERSION N/A 06/07/2019   Procedure: CARDIOVERSION;  Surgeon: [name], MD;  Location: [facility] ENDOSCOPY;  Service: Cardiovascular;  Laterality: N/A;  . CATARACT EXTRACTION W/ INTRAOCULAR LENS  IMPLANT, BILATERAL Bilateral 2016  . CYST REMOVAL HAND Bilateral 1990's   "played to much golf"  . DILATION  AND CURETTAGE OF UTERUS    . implantable loop recorder placement  11/20/2019   Medtronic Reveal Linq model LNQ 22 (model number [id number]) implanted in office by Dr Rayann Heman for afib management  . LUMBAR LAMINECTOMY  40 yrs ago   "L3-4"  . MENISCUS REPAIR Bilateral 2000 and 2010  . REPLACEMENT TOTAL KNEE Left 2013  . TEE WITHOUT CARDIOVERSION N/A 04/24/2019   Procedure: TRANSESOPHAGEAL ECHOCARDIOGRAM (TEE);  Surgeon: Acie Fredrickson Wonda Cheng, MD;  Location: [facility];  Service: Cardiovascular;  Laterality: N/A;  . TONSILECTOMY, ADENOIDECTOMY, BILATERAL MYRINGOTOMY AND TUBES  child  . TOTAL ABDOMINAL HYSTERECTOMY  1995   had 2 tumors- benign  . TUBAL LIGATION  87 years old    ROS- all systems are reviewed and negatives except as per HPI above  Current Outpatient Medications  Medication Sig Dispense Refill  . acetaminophen (TYLENOL) 325 MG tablet Take 325 mg by mouth daily as needed (pain.).    Marland Kitchen atorvastatin (LIPITOR) 20 MG tablet TAKE 1 TABLET BY MOUTH  DAILY AT 6 PM 90 tablet 3  . bumetanide (BUMEX) 1 MG tablet Take 1 tablet (1 mg total) by mouth daily. 90 tablet 1  . Calcium Carb-Cholecalciferol (CALCIUM 600 + D PO) Take 1 tablet by mouth 2 (two) times daily.    . Carboxymethylcellul-Glycerin (LUBRICATING EYE DROPS OP) Place 1 drop into both eyes in the morning and at bedtime.    Marland Kitchen diltiazem (CARDIZEM CD) 120 MG 24 hr capsule Take 1 capsule (120 mg total) by mouth daily. 60 capsule 6  . lisinopril (ZESTRIL) 10 MG tablet Take 1 tablet (10 mg total) by mouth daily. 90 tablet 1  . loratadine (CLARITIN) 10 MG tablet Take 10 mg by mouth daily.     . Multiple Vitamins-Minerals (ONE-A-DAY WOMENS 50 PLUS PO) Take 1 tablet by mouth daily.    . Probiotic CAPS Take 1 capsule by mouth daily.    . psyllium (REGULOID) 0.52 g capsule Take 0.52 g by mouth 2 (two) times daily.     Marland Kitchen apixaban (ELIQUIS) 5 MG TABS tablet Take 1 tablet (5 mg total) by mouth in the morning and at bedtime. 180 tablet 1  . Biotin  5000 MCG CAPS Take 5,000 mcg by mouth daily.  (Patient not taking: Reported on 09/12/2020)    . dofetilide (TIKOSYN) 250 MCG capsule TAKE ONE (1) CAPSULE BY MOUTH 2 TIMES DAILY 60 capsule 8  . metoprolol succinate (TOPROL-XL) 25 MG 24 hr tablet Take 1.5 tablets (37.5 mg total) by mouth 2 (two) times daily. 270 tablet 2  . potassium chloride (KLOR-CON) 10 MEQ tablet TAKE 1 TABLET BY MOUTH IN  THE MORNING AND 2 TABLETS  IN THE EVENING 270 tablet 3   No current facility-administered medications for this encounter.    Physical Exam: Vitals:   09/12/20 1327  BP: (!) 146/74  Pulse: 69  Weight:  124.9 kg  Height: 5\' 3"  (1.6 m)    GEN- The patient is well appearing, alert and oriented x 3 today.   Head- normocephalic, atraumatic Eyes-  Sclera clear, conjunctiva pink Ears- hearing intact Oropharynx- clear Lungs- Clear to ausculation bilaterally, normal work of breathing Heart-  regular rhythm GI- soft, NT, ND, + BS Extremities- no clubbing, cyanosis, or edema  Wt Readings from Last 3 Encounters:  09/12/20 124.9 kg  06/10/20 126 kg  05/17/20 125.6 kg    EKG - NSR at 67 bpm, PR int 200 ms, qrs int 78 ms, qtc 464 ms   Recent echo is also reviewed  Assessment and Plan:  1. Persistent atrial fibrillation/flutter  S/p afib ablation 04/25/19 Required CV right after ablation but since then has enjoyed SR for the most part with episodes of stuttering afib In SR today  Has linq, remote checks per schedule  Continue Tikosyn/diltaizem /metorpolol at 37.5 mg bid  Continue eliquis 5 mg bid with  CHA2DS2VASc score of at least 6  bmet/k+ today    2.OSA She reports compliance with CPAP  3. HTN Stable    F/u with Dr. Rayann Heman in the spring per recall and afib clinic as needed    Geroge Baseman. Mescal Flinchbaugh, [facility] [address] [place], [place] [address] [phone]

## 2020-09-16 ENCOUNTER — Ambulatory Visit (INDEPENDENT_AMBULATORY_CARE_PROVIDER_SITE_OTHER): Payer: Medicare Other

## 2020-09-16 DIAGNOSIS — I4819 Other persistent atrial fibrillation: Secondary | ICD-10-CM | POA: Diagnosis not present

## 2020-09-19 LAB — CUP PACEART REMOTE DEVICE CHECK
Date Time Interrogation Session: 20220111230205
Implantable Pulse Generator Implant Date: 20210322

## 2020-09-26 ENCOUNTER — Telehealth: Payer: Self-pay

## 2020-09-26 NOTE — Telephone Encounter (Signed)
CArelink alert- Ongoing AF, ventricular rates not really controlled at 110-140bpm.    Spoke with pt, she was aware of HR going out of rhythm yesterday morning.  She took extra 1/2 tab of metoprolol (50mg  total) yesterday morning, last night and again this morning.  Pt reports she feels very fatigued and if she tries to do any activity she is SOB.    Pt has a linq 2 and cannot force transmission, however she reports her pulse ox shows current HR is in 130s    Advised I would forward information to AF clinic for f/u.

## 2020-09-30 NOTE — Progress Notes (Signed)
Carelink Summary Report / Loop Recorder 

## 2020-09-30 NOTE — Telephone Encounter (Signed)
Patient still out rhythm she has increased her metoprolol to 50mg  BID and would like to "give it" a few more days to convert on its own before coming into the office. In the past shes converted after 7-10 days. She feels ok just very fatigued. She will call the office on Thursday with update - if still out then she will come in for assessment.

## 2020-10-03 NOTE — Telephone Encounter (Signed)
Pt called to report she is back in rhythm. Confirmed through linq transmission. She has reduced her metoprolol back down to 37.5mg  BID. Will call if issues arise.

## 2020-10-16 LAB — CUP PACEART REMOTE DEVICE CHECK
Date Time Interrogation Session: 20220213230510
Implantable Pulse Generator Implant Date: 20210322

## 2020-10-18 NOTE — Progress Notes (Signed)
Chronic Care Management Pharmacy Note  10/25/2020 Name:  Tracy Miles MRN:  [id number] DOB:  [birth year]  Subjective: Tracy Miles is an 87 y.o. year old female who is a primary patient of [name], MD.  The CCM team was consulted for assistance with disease management and care coordination needs.    Engaged with patient by telephone for follow up visit in response to provider referral for pharmacy case management and/or care coordination services.   Consent to Services:  The patient was given the following information about Chronic Care Management services today, agreed to services, and gave verbal consent: 1. CCM service includes personalized support from designated clinical staff supervised by the primary care provider, including individualized plan of care and coordination with other care providers 2. 24/7 contact phone numbers for assistance for urgent and routine care needs. 3. Service will only be billed when office clinical staff spend 20 minutes or more in a month to coordinate care. 4. Only one practitioner may furnish and bill the service in a calendar month. 5.The patient may stop CCM services at any time (effective at the end of the month) by phone call to the office staff. 6. The patient will be responsible for cost sharing (co-pay) of up to 20% of the service fee (after annual deductible is met). Patient agreed to services and consent obtained.  Patient Care Team: [name], MD as PCP - General (Family Medicine) Clayborne Artist, NP as Nurse Practitioner (Cardiology) Rigoberto Noel, MD as Consulting Physician (Pulmonary Disease) Melrose Nakayama, MD as Consulting Physician (Orthopedic Surgery) Edythe Clarity, [facility] (Pharmacist)  Recent office visits: 05/14/20: Visit w/ Dr. Charlett Blake -  No med changes noted.   Recent consult visits: 09/12/20: Cardio visit w/ Dr. Kayleen Memos - no medication changes patient stable  06/10/20: Cardio visit w/ Dr. Rayann Heman - Afib burden  increased from 3% to 11.4% by ILR. Recommend exercise and weight loss. Will continue medical therapy vs ablation at this time. RTC Afib clinic in 3 months and cardio in 6 months.   05/17/20: Afib clinic w/ [name], NP - Pt increased metoprolol to 81m BID when she went out of rhythm week prior, then went back to 37.564mBID. Would like to consider ablation due to fatigue with increased BB dose and Afib symptoms. Refer to Dr. AlRayann Hemanor ablation discussion.   Hospital visits: None in previous 6 months  Objective:  Lab Results  Component Value Date   CREATININE 0.99 09/12/2020   BUN 17 09/12/2020   GFR 58.96 (L) 11/07/2019   GFRNONAA 56 (L) 09/12/2020   GFRAA 48 (L) 05/02/2020   NA 137 09/12/2020   K 4.0 09/12/2020   CALCIUM 9.7 09/12/2020   CO2 29 09/12/2020    Lab Results  Component Value Date/Time   HGBA1C 5.6 05/14/2020 02:13 PM   HGBA1C 5.9 11/07/2019 02:08 PM   GFR 58.96 (L) 11/07/2019 02:08 PM   GFR 55.77 (L) 04/05/2018 01:57 PM    Last diabetic Eye exam: No results found for: HMDIABEYEEXA  Last diabetic Foot exam: No results found for: HMDIABFOOTEX   Lab Results  Component Value Date   CHOL 150 05/14/2020   HDL 41 (L) 05/14/2020   LDLCALC 85 05/14/2020   TRIG 142 05/14/2020   CHOLHDL 3.7 05/14/2020    Hepatic Function Latest Ref Rng & Units 05/14/2020 11/07/2019 04/20/2019  Total Protein 6.1 - 8.1 g/dL 6.9 7.0 6.8  Albumin 3.5 - 5.2 g/dL - 4.2 4.5  AST 10 -  35 U/L _0 ALT 6 - 29 U/L _1 Alk Phosphatase 39 - 117 U/L - 116 99  Total Bilirubin 0.2 - 1.2 mg/dL 0.8 0.7 1.0  Bilirubin, Direct 0.0 - 0.3 mg/dL - - -    Lab Results  Component Value Date/Time   TSH 1.41 05/14/2020 02:13 PM   TSH 2.10 11/07/2019 02:08 PM   FREET4 0.99 10/12/2013 11:34 AM    CBC Latest Ref Rng & Units 05/02/2020 11/07/2019 06/02/2019  WBC 4.0 - 10.5 K/uL 6.8 6.7 6.9  Hemoglobin 12.0 - 15.0 g/dL 13.4 13.2 12.6  Hematocrit 36.0 - 46.0 % 41.6 39.6 39.8  Platelets 150 -  400 K/uL 248 247.0 260    No results found for: VD25OH  Clinical ASCVD: Yes  The ASCVD Risk score Mikey Bussing DC Jr., et al., 2013) failed to calculate for the following reasons:   The 2013 ASCVD risk score is only valid for ages 62 to 96    Depression screen PHQ 2/9 07/22/2020 11/07/2019 04/21/2018  Decreased Interest 0 0 0  Down, Depressed, Hopeless 0 0 0  PHQ - 2 Score 0 0 0  Some recent data might be hidden      Social History   Tobacco Use  Smoking Status Never Smoker  Smokeless Tobacco Never Used  Tobacco Comment   never used tobacco   BP Readings from Last 3 Encounters:  09/12/20 (!) 146/74  06/10/20 (!) 172/86  05/17/20 (!) 160/90   Pulse Readings from Last 3 Encounters:  09/12/20 69  06/10/20 75  05/17/20 67   Wt Readings from Last 3 Encounters:  09/12/20 275 lb 6.4 oz (124.9 kg)  06/10/20 277 lb 12.8 oz (126 kg)  05/17/20 277 lb (125.6 kg)    Assessment/Interventions: Review of patient past medical history, allergies, medications, health status, including review of consultants reports, laboratory and other test data, was performed as part of comprehensive evaluation and provision of chronic care management services.   SDOH:  (Social Determinants of Health) assessments and interventions performed: No   CCM Care Plan  Allergies  Allergen Reactions  . Gabapentin Swelling  . Lactose Intolerance (Gi) Other (See Comments)    Bothers her stomach  . Zebeta [Bisoprolol Fumarate] Nausea Only  . Penicillins Hives and Rash    Did it involve swelling of the face/tongue/throat, SOB, or low BP? No Did it involve sudden or severe rash/hives, skin peeling, or any reaction on the inside of your mouth or nose? Yes Did you need to seek medical attention at a hospital or doctor's office? No When did it last happen? 25+ years If all above answers are "NO", may proceed with cephalosporin use.   . Sulfa Antibiotics Rash    Medications Reviewed Today    Reviewed by  Edythe Clarity, [facility] (Pharmacist) on 10/25/20 at [phone]  Med List Status: <None>  Medication Order Taking? Sig Documenting Provider Last Dose Status Informant  acetaminophen (TYLENOL) 325 MG tablet [id number] Yes Take 325 mg by mouth daily as needed (pain.). [provider] Taking Active Self  apixaban (ELIQUIS) 5 MG TABS tablet [id number] Yes Take 1 tablet (5 mg total) by mouth in the morning and at bedtime. Sherran Needs, NP Taking Active   atorvastatin (LIPITOR) 20 MG tablet [id number] Yes TAKE 1 TABLET BY MOUTH  DAILY AT 6 PM [name], MD Taking Active Self  Biotin 5000 MCG CAPS [id number] Yes Take 5,000 mcg by mouth daily. [provider] Taking  Active   bumetanide (BUMEX) 1 MG tablet [id number] Yes Take 1 tablet (1 mg total) by mouth daily. [name], MD Taking Active   Calcium Carb-Cholecalciferol (CALCIUM 600 + D PO) [id number] Yes Take 1 tablet by mouth 2 (two) times daily. [provider] Taking Active Self  Carboxymethylcellul-Glycerin (LUBRICATING EYE DROPS OP) [id number] Yes Place 1 drop into both eyes in the morning and at bedtime. [provider] Taking Active Self  diltiazem (CARDIZEM CD) 120 MG 24 hr capsule [id number] Yes TAKE ONE CAPSULE BY MOUTH DAILY Sherran Needs, NP Taking Active   dofetilide (TIKOSYN) 250 MCG capsule [id number] Yes TAKE ONE (1) CAPSULE BY MOUTH 2 TIMES DAILY Sherran Needs, NP Taking Active   lisinopril (ZESTRIL) 10 MG tablet [id number] Yes Take 1 tablet (10 mg total) by mouth daily. [name], MD Taking Active   loratadine (CLARITIN) 10 MG tablet [id number] Yes Take 10 mg by mouth daily.  [provider] Taking Active Self  metoprolol succinate (TOPROL-XL) 25 MG 24 hr tablet [id number] Yes Take 1.5 tablets (37.5 mg total) by mouth 2 (two) times daily. Sherran Needs, NP Taking Active   Multiple Vitamins-Minerals (ONE-A-DAY WOMENS 50 PLUS PO) [id number] Yes Take 1 tablet by mouth daily. [provider] Taking Active   potassium chloride (KLOR-CON) 10 MEQ tablet [id number] Yes TAKE 1 TABLET BY MOUTH IN  THE MORNING AND 2 TABLETS  IN THE Stann Mainland, NP Taking Active   Probiotic CAPS [id number] Yes Take 1 capsule by mouth daily. [provider] Taking Active Self  psyllium (REGULOID) 0.52 g capsule [id number] Yes Take 0.52 g by mouth 2 (two) times daily.  [provider] Taking Active Self          Patient Active Problem List   Diagnosis Date Noted  . History of oral lesions 05/15/2020  . Persistent atrial fibrillation ([place]) 04/25/2019  . Pulmonary fibrosis, unspecified ([place]) 04/11/2019  . Abnormal chest x-ray 03/20/2019  . 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 Hyperglycemia 11/22/2017  . Urinary urgency 11/22/2017  . Pedal edema 08/28/2016  . Medicare annual wellness visit, subsequent 09/15/2015  . Visit for monitoring Tikosyn therapy 08/11/2015  . Chronic diastolic HF (heart failure) ([place]) 08/11/2015  . Obstructive sleep apnea 02/22/2015  . Spinal stenosis of lumbar region 04/02/2014  . Urinary tract infection with hematuria 03/26/2014  . Personal history of colonic polyps 02/25/2014  . Decreased visual acuity 02/25/2014  . Benign paroxysmal positional vertigo 12/17/2013  . Abnormal results of thyroid function studies 12/17/2013  . History of DVT (deep vein thrombosis) 11/19/2013  . Allergic state 11/19/2013  . OA (osteoarthritis) 11/19/2013  . Constipation 11/16/2013  . Cervical disc disease 11/16/2013  . Obesity   . Iron deficiency anemia 11/13/2013  . Fatigue 11/13/2013  . Hyperlipidemia, mixed 10/19/2013  . Atrial fibrillation ([place]) 10/04/2013  . Essential hypertension 10/04/2013    Immunization History  Administered Date(s) Administered  . Fluad Quad(high Dose 65+) 06/16/2019  . Influenza, High Dose Seasonal PF 05/11/2017, 05/24/2018  . Influenza,inj,Quad PF,6+ Mos 04/30/2016  .  Influenza-Unspecified 05/31/2013, 05/01/2014, 06/11/2015  . Moderna Sars-Covid-2 Vaccination 09/13/2019, 10/10/2019  . Pneumococcal Conjugate-13 04/02/2014  . Pneumococcal Polysaccharide-23 08/31/1998, 09/06/2015  . Tdap 07/31/2012  . Zoster Recombinat (Shingrix) 04/19/2018, 06/27/2018    Conditions to be addressed/monitored:  Hypertension, Hyperlipidemia, Pre-Diabetes, Afib, Heart Failure, Erosive Lichen Planus  Care Plan : General Pharmacy (Adult)  Updates made by Beverly Milch  L, RPH since 10/25/2020 12:00 AM    Problem: Hypertension, Hyperlipidemia, Pre-Diabetes, Afib, Heart Failure, Erosive Lichen Planus   Priority: High  Onset Date: 10/25/2020    Long-Range Goal: Patient-Specific Goal   Start Date: 10/25/2020  Expected End Date: 04/24/2021  This Visit's Progress: On track  Priority: High  Note:   Current Barriers:  . No specific barriers identified at this visit   Pharmacist Clinical Goal(s):  Marland Kitchen Over the next 120 days, patient will achieve adherence to monitoring guidelines and medication adherence to achieve therapeutic efficacy . maintain control of blood pressure as evidenced by home monitoring  . contact provider office for questions/concerns as evidenced notation of same in electronic health record through collaboration with PharmD and provider.   Interventions: . 1:1 collaboration with [name], MD regarding development and update of comprehensive plan of care as evidenced by provider attestation and co-signature . Inter-disciplinary care team collaboration (see longitudinal plan of care) . Comprehensive medication review performed; medication list updated in electronic medical record  Hypertension (BP goal <140/90) -controlled -Current treatment:  Lisinopril 41m daily (has taken "forever")  Metoprolol succinate 259m1.5 tabs twice daily  Diltiazem ER 12046maily -Medications previously tried: none noted  -Current home readings: did not have specific  readings with her, reports it has been good  -Denies hypotensive/hypertensive symptoms -Educated on BP goals and benefits of medications for prevention of heart attack, stroke and kidney damage; Importance of home blood pressure monitoring; -Counseled to monitor BP at home daily, document, and provide log at future appointments -Recommended to continue current medication  Hyperlipidemia: (LDL goal < 100) -controlled -Current treatment:  Atorvastatin 90m57mily  -Medications previously tried: none noted -Reviewed most recent lipid panel, LDL at goal -Denies myalgias -Educated on Cholesterol goals;  Benefits of statin for ASCVD risk reduction; -Recommended to continue current medication  Pre-Diabetes (A1c goal <6.5%) -controlled -Current medications: . None -Medications previously tried: none ntoed  -Current home glucose readings - patient not checking, does not need to -Denies hypoglycemic/hyperglycemic symptoms -Current meal patterns:  -Current exercise: walks in heated pool three times per week  -Educated onA1c and blood sugar goals; -Most recent A1c outside of pre-diabetic range -Counseled to check feet daily and get yearly eye exams -Recommended to continue current lifestyle management strategy  Atrial Fibrillation (Goal: prevent stroke and major bleeding) -controlled  -CHADSVASC: >6  -Current treatment:  Rate control: Diltiazem ER 190mg82mly, Metoprolol succinate 25mg 10mtabs bid, Dofetilide 250mcg 62me daily  Anticoagulation: Eliquis 5mg twi68mdaily -Medications previously tried: amiodarone (lung toxicity), verapamil (D/C when tikosyn started), Xarelto (anemia per pt), warfarin (labile INR) -Home BP and HR readings: all pretty "normal" lately -She now will take an additional half tablet of her metoprolol whenever she is in Afib, usually resolves within a half hour  -Counseled on increased risk of stroke due to Afib and benefits of anticoagulation for stroke  prevention; avoidance of NSAIDs due to increased bleeding risk with anticoagulants; -Recommended to continue current medication  Heart Failure (Goal: control symptoms and prevent exacerbations) controlled Type: Systolic -NYHA Class: II (slight limitation of activity) -Ejection fraction: 45-50% (Date: 04/19/19) -Current treatment:  Bumetanide 1mg dail46mPotassium Chloride 10mEq 1 t73mM and 2 tab PM  Metoprolol succinate 25mg 1.5 t105mtwice daily (has only been taking for a year) -Medications previously tried: none noted -Current dietary habits: does limit her daily salt intake -Current exercise routine: walking in pool -Educated on Benefits of medications for managing symptoms and  prolonging life Importance of weighing daily; if you gain more than 3 pounds in one day or 5 pounds in one week, call providers Importance of blood pressure control -Recommended to continue current medication   Patient Goals/Self-Care Activities . Over the next 120 days, patient will:  - take medications as prescribed check blood pressure daily, document, and provide at future appointments weigh daily, and contact provider if weight gain of 3 lbs in a day or 5 lbs in one week  Follow Up Plan: The care management team will reach out to the patient again over the next 120 days.         Medication Assistance: None required.  Patient affirms current coverage meets needs.  Patient's preferred pharmacy is:  [facility], [place] - [address] [place] [address] Phone: [phone] Fax: [phone]  [place], [place], [address] [place], [address] [place] [address] Phone: [phone] Fax: [phone]  Uses pill box? Yes Pt endorses 100% compliance  We discussed: Benefits of medication synchronization, packaging and delivery as well as enhanced pharmacist oversight with [employer]. Patient  decided to: Continue current medication management strategy  Care Plan and Follow Up Patient Decision:  Patient agrees to Care Plan and Follow-up.  Plan: The care management team will reach out to the patient again over the next 120 days.  Beverly Milch, PharmD Clinical Pharmacist [place] [phone]

## 2020-10-21 ENCOUNTER — Ambulatory Visit (INDEPENDENT_AMBULATORY_CARE_PROVIDER_SITE_OTHER): Payer: Medicare Other

## 2020-10-21 ENCOUNTER — Other Ambulatory Visit (HOSPITAL_COMMUNITY): Payer: Self-pay | Admitting: Nurse Practitioner

## 2020-10-21 DIAGNOSIS — I4819 Other persistent atrial fibrillation: Secondary | ICD-10-CM | POA: Diagnosis not present

## 2020-10-25 ENCOUNTER — Ambulatory Visit (INDEPENDENT_AMBULATORY_CARE_PROVIDER_SITE_OTHER): Payer: Medicare Other

## 2020-10-25 DIAGNOSIS — I480 Paroxysmal atrial fibrillation: Secondary | ICD-10-CM | POA: Diagnosis not present

## 2020-10-25 DIAGNOSIS — I10 Essential (primary) hypertension: Secondary | ICD-10-CM | POA: Diagnosis not present

## 2020-10-25 DIAGNOSIS — E782 Mixed hyperlipidemia: Secondary | ICD-10-CM

## 2020-10-25 DIAGNOSIS — I5032 Chronic diastolic (congestive) heart failure: Secondary | ICD-10-CM

## 2020-10-25 NOTE — Patient Instructions (Addendum)
Visit Information  Goals Addressed            This Visit's Progress   . Track and Manage Fluids and Swelling-Heart Failure       Timeframe:  Long-Range Goal Priority:  High Start Date:  10/25/2020                           Expected End Date:   04/24/2021                    Follow Up Date 01/28/21   - call office if I gain more than 2 pounds in one day or 5 pounds in one week - use salt in moderation - watch for swelling in feet, ankles and legs every day - weigh myself daily    Why is this important?    It is important to check your weight daily and watch how much salt and liquids you have.   It will help you to manage your heart failure.    Notes:       Patient Care Plan: General Pharmacy (Adult)    Problem Identified: Hypertension, Hyperlipidemia, Pre-Diabetes, Afib, Heart Failure, Erosive Lichen Planus   Priority: High  Onset Date: 10/25/2020    Long-Range Goal: Patient-Specific Goal   Start Date: 10/25/2020  Expected End Date: 04/24/2021  This Visit's Progress: On track  Priority: High  Note:   Current Barriers:  . No specific barriers identified at this visit   Pharmacist Clinical Goal(s):  Marland Kitchen Over the next 120 days, patient will achieve adherence to monitoring guidelines and medication adherence to achieve therapeutic efficacy . maintain control of blood pressure as evidenced by home monitoring  . contact provider office for questions/concerns as evidenced notation of same in electronic health record through collaboration with PharmD and provider.   Interventions: . 1:1 collaboration with [name], MD regarding development and update of comprehensive plan of care as evidenced by provider attestation and co-signature . Inter-disciplinary care team collaboration (see longitudinal plan of care) . Comprehensive medication review performed; medication list updated in electronic medical record  Hypertension (BP goal <140/90) -controlled -Current  treatment:  Lisinopril 10mg  daily (has taken "forever")  Metoprolol succinate 25mg  1.5 tabs twice daily  Diltiazem ER 120mg  daily -Medications previously tried: none noted  -Current home readings: did not have specific readings with her, reports it has been good  -Denies hypotensive/hypertensive symptoms -Educated on BP goals and benefits of medications for prevention of heart attack, stroke and kidney damage; Importance of home blood pressure monitoring; -Counseled to monitor BP at home daily, document, and provide log at future appointments -Recommended to continue current medication  Hyperlipidemia: (LDL goal < 100) -controlled -Current treatment:  Atorvastatin 20mg  daily  -Medications previously tried: none noted -Reviewed most recent lipid panel, LDL at goal -Denies myalgias -Educated on Cholesterol goals;  Benefits of statin for ASCVD risk reduction; -Recommended to continue current medication  Pre-Diabetes (A1c goal <6.5%) -controlled -Current medications: . None -Medications previously tried: none ntoed  -Current home glucose readings - patient not checking, does not need to -Denies hypoglycemic/hyperglycemic symptoms -Current meal patterns:  -Current exercise: walks in heated pool three times per week  -Educated onA1c and blood sugar goals; -Most recent A1c outside of pre-diabetic range -Counseled to check feet daily and get yearly eye exams -Recommended to continue current lifestyle management strategy  Atrial Fibrillation (Goal: prevent stroke and major bleeding) -controlled  -CHADSVASC: >6  -Current  treatment:  Rate control: Diltiazem ER 120mg  daily, Metoprolol succinate 25mg  1.5 tabs bid, Dofetilide 231mcg twice daily  Anticoagulation: Eliquis 5mg  twice daily -Medications previously tried: amiodarone (lung toxicity), verapamil (D/C when tikosyn started), Xarelto (anemia per pt), warfarin (labile INR) -Home BP and HR readings: all pretty "normal"  lately -She now will take an additional half tablet of her metoprolol whenever she is in Afib, usually resolves within a half hour  -Counseled on increased risk of stroke due to Afib and benefits of anticoagulation for stroke prevention; avoidance of NSAIDs due to increased bleeding risk with anticoagulants; -Recommended to continue current medication  Heart Failure (Goal: control symptoms and prevent exacerbations) controlled Type: Systolic -NYHA Class: II (slight limitation of activity) -Ejection fraction: 45-50% (Date: 04/19/19) -Current treatment:  Bumetanide 1mg  daily  Potassium Chloride 19mEq 1 tab AM and 2 tab PM  Metoprolol succinate 25mg  1.5 tabs twice daily (has only been taking for a year) -Medications previously tried: none noted -Current dietary habits: does limit her daily salt intake -Current exercise routine: walking in pool -Educated on Benefits of medications for managing symptoms and prolonging life Importance of weighing daily; if you gain more than 3 pounds in one day or 5 pounds in one week, call providers Importance of blood pressure control -Recommended to continue current medication   Patient Goals/Self-Care Activities . Over the next 120 days, patient will:  - take medications as prescribed check blood pressure daily, document, and provide at future appointments weigh daily, and contact provider if weight gain of 3 lbs in a day or 5 lbs in one week  Follow Up Plan: The care management team will reach out to the patient again over the next 120 days.         The patient verbalized understanding of instructions, educational materials, and care plan provided today and agreed to receive a mailed copy of patient instructions, educational materials, and care plan.  Telephone follow up appointment with pharmacy team member scheduled for: 4 months  Edythe Clarity, [facility]  Heart Failure, Self-Care Heart failure is a serious condition. The following  information explains the things you need to do to take care of yourself after a heart failure diagnosis. You may be asked to change your diet, take certain medicines, and make other lifestyle changes in order to stay as healthy as possible. Your health care provider may also give you more specific instructions. If you have problems or questions, contact your health care provider. What are the risks? Having heart failure puts you at higher risk for certain problems. These problems can get worse if you do not take good care of yourself. Problems may include:  Damage to the kidneys, liver, or lungs.  Malnutrition.  Abnormal heart rhythms.  Blood clotting issues that could cause a stroke. Supplies needed:  Scale for monitoring weight.  Blood pressure monitor.  Notebook.  Medicines. How to care for yourself when you have heart failure Medicines Take over-the-counter and prescription medicines only as told by your health care provider. Medicines reduce the workload of your heart, slow the progression of heart failure, and improve symptoms. Take your medicines every day.  Do not stop taking your medicine unless your health care provider tells you to do so.  Do not skip any dose of medicine.  Refill your prescriptions before you run out of medicine.  Talk with your health care provider if you cannot afford your medicines. Eating and drinking  Eat heart-healthy foods. Talk with a dietitian to  make an eating plan that is right for you. ? Limit salt (sodium) if told by your health care provider. Sodium restriction may reduce symptoms of heart failure. Ask a dietitian to recommend heart-healthy seasonings. ? Use healthy cooking methods instead of frying. Healthy methods include roasting, grilling, broiling, baking, poaching, steaming, and stir-frying. ? Choose foods that contain no trans fat and are low in saturated fat and cholesterol. Healthy choices include fresh or frozen fruits and  vegetables, fish, lean meats, legumes, fat-free or low-fat dairy products, and whole-grain or high-fiber foods.  Limit your fluid intake, if directed by your health care provider. Fluid restriction may reduce symptoms of heart failure.   Alcohol use  Do not drink alcohol if: ? Your health care provider tells you not to drink. ? Your heart was damaged by alcohol, or you have severe heart failure. ? You are pregnant, may be pregnant, or are planning to become pregnant.  If you drink alcohol: ? Limit how much you have to:  0-1 drink a day for women.  0-2 drinks a day for men. ? Know how much alcohol is in your drink. In the [place] one drink equals one 12 oz bottle of beer (355 mL), one 5 oz glass of wine (148 mL), or one 1 oz glass of hard liquor (44 mL). Lifestyle  Do not use any products that contain nicotine or tobacco. These products include cigarettes, chewing tobacco, and vaping devices, such as e-cigarettes. If you need help quitting, ask your health care provider. ? Do not use nicotine gum or patches before talking to your health care provider.  Do not use illegal drugs.  Work with your health care provider to safely reach the right body weight.  Do physical activity if told by your health care provider. Talk to your health care provider before you begin an exercise if: ? You are an older adult. ? You have severe heart failure.  Learn to manage stress. If you need help to do this, ask your health care provider.  Participate in or seek physical rehabilitation as needed to keep or improve your independence and quality of life.  Participate in a cardiac rehabilitation program, which is a treatment program to improve your health and well-being through exercise training, education, and counseling.  Plan rest periods when you get tired.   Monitoring important information  Weigh yourself every day. This will help you to notice if too much fluid is building up in your  body. ? Weigh yourself every morning after you urinate and before you eat breakfast. ? Wear the same amount of clothing each time you weigh yourself. ? Record your daily weight. Provide your health care provider with your weight record.  Monitor and record your pulse and blood pressure as told by your health care provider.   Dealing with extreme temperatures  If the weather is extremely hot: ? Avoid vigorous physical activity. ? Use air conditioning or fans, or find a cooler location. ? Avoid caffeine and alcohol. ? Wear loose-fitting, lightweight, and light-colored clothing.  If the weather is extremely cold: ? Avoid vigorous activity. ? Layer your clothes. ? Wear mittens or gloves, a hat, and a face covering when you go outside. ? Avoid alcohol. Follow these instructions at home:  Stay up to date with vaccines. Pneumococcal and flu (influenza) vaccines are especially important in preventing infections of the airways.  Keep all follow-up visits. This is important. Contact a health care provider if you:  Gain 2-3 lb (  1-1.4 kg) in 24 hours or 5 lb (2.3 kg) in a week.  Have increasing shortness of breath.  Are unable to participate in your usual physical activities.  Get tired easily.  Cough more than normal, especially with physical activity.  Lose your appetite or feel nauseous.  Have any swelling or more swelling in areas such as your hands, feet, ankles, or abdomen.  Are unable to sleep because it is hard to breathe.  Feel like your heart is beating quickly (palpitations).  Become dizzy or light-headed when you stand up.  Have feelings of depression or sadness. Get help right away if you:  Have trouble breathing.  Notice, or your family notices, a change in your awareness, such as having trouble staying awake or concentrating.  Have pain or discomfort in your chest.  Have an episode of fainting (syncope). These symptoms may represent a serious problem that is  an emergency. Do not wait to see if the symptoms will go away. Get medical help right away. Call your local emergency services (911 in the [place] Do not drive yourself to the hospital. Summary  Heart failure is a serious condition. To care for yourself, you may be asked to change your diet, take certain medicines, and make other lifestyle changes.  Take your medicines every day. Do not stop taking them unless your health care provider tells you to do so.  Limit salt and eat heart-healthy foods, such as fresh or frozen fruits and vegetables, fish, lean meats, legumes, fat-free or low-fat dairy products, and whole-grain or high-fiber foods.  Ask your health care provider if you have any alcohol restrictions. You may have to stop drinking alcohol if you have severe heart failure.  Contact your health care provider if you notice problems, such as rapid weight gain or a fast heartbeat. Get help right away if you faint or have chest pain or trouble breathing. This information is not intended to replace advice given to you by your health care provider. Make sure you discuss any questions you have with your health care provider. Document Revised: 03/09/2020 Document Reviewed: 03/09/2020 Elsevier Patient Education  [employer].

## 2020-10-28 NOTE — Progress Notes (Signed)
Carelink Summary Report / Loop Recorder 

## 2020-11-07 ENCOUNTER — Ambulatory Visit (HOSPITAL_BASED_OUTPATIENT_CLINIC_OR_DEPARTMENT_OTHER)
Admission: RE | Admit: 2020-11-07 | Discharge: 2020-11-07 | Disposition: A | Discharge status: Home / Self Care | Payer: Medicare Other | Source: Ambulatory Visit | Attending: Family Medicine | Admitting: Family Medicine

## 2020-11-07 ENCOUNTER — Other Ambulatory Visit: Payer: Self-pay

## 2020-11-07 ENCOUNTER — Telehealth: Payer: Self-pay | Admitting: Emergency Medicine

## 2020-11-07 DIAGNOSIS — Z1231 Encounter for screening mammogram for malignant neoplasm of breast: Secondary | ICD-10-CM | POA: Diagnosis not present

## 2020-11-07 NOTE — Telephone Encounter (Signed)
Returning patient's call. [facility] alert received on 11/07/2020. Carelink Alert received for an ongoing A-fib episode. Patient states compliance with medication Eliquis 5 mg's BID, dofetilide 250 mg BID, Cardizem 120 mg daily and metoprolol 25 mg. Patient states that she can tell that she has been in A-fib. Patient not complaining of any symptoms at this time but states of the last two days she has been fatigued. Patient states that she has been taking extra metoprolol to alleviate the issue. Patient agreeable to be referred to the [facility]. Advised patient that the [facility] would be in contact with her.

## 2020-11-07 NOTE — Telephone Encounter (Signed)
Attempted to contact patient in reference to [facility] alert received for AF event ongoing at time of transmission; ECG suggest AF. History of AF and managed with [facility]. Left message on patient's voicemail.

## 2020-11-12 ENCOUNTER — Ambulatory Visit: Payer: Medicare Other | Admitting: Family Medicine

## 2020-11-12 NOTE — Telephone Encounter (Signed)
Per linq report today pt is back in NSR with controlled rate.

## 2020-11-13 ENCOUNTER — Other Ambulatory Visit: Payer: Self-pay | Admitting: Family Medicine

## 2020-11-14 ENCOUNTER — Ambulatory Visit (INDEPENDENT_AMBULATORY_CARE_PROVIDER_SITE_OTHER): Payer: Medicare Other | Admitting: Family Medicine

## 2020-11-14 ENCOUNTER — Other Ambulatory Visit: Payer: Self-pay | Admitting: Family Medicine

## 2020-11-14 ENCOUNTER — Encounter: Payer: Self-pay | Admitting: Family Medicine

## 2020-11-14 ENCOUNTER — Other Ambulatory Visit: Payer: Self-pay

## 2020-11-14 VITALS — BP 124/68 | HR 67 | Temp 98.1°F | Resp 18 | Ht 63.0 in | Wt 281.0 lb

## 2020-11-14 DIAGNOSIS — L719 Rosacea, unspecified: Secondary | ICD-10-CM

## 2020-11-14 DIAGNOSIS — I480 Paroxysmal atrial fibrillation: Secondary | ICD-10-CM

## 2020-11-14 DIAGNOSIS — E782 Mixed hyperlipidemia: Secondary | ICD-10-CM

## 2020-11-14 DIAGNOSIS — R739 Hyperglycemia, unspecified: Secondary | ICD-10-CM

## 2020-11-14 DIAGNOSIS — I10 Essential (primary) hypertension: Secondary | ICD-10-CM

## 2020-11-14 DIAGNOSIS — L578 Other skin changes due to chronic exposure to nonionizing radiation: Secondary | ICD-10-CM

## 2020-11-14 MED ORDER — ATORVASTATIN CALCIUM 20 MG PO TABS: 20.0000 mg | ORAL_TABLET | Freq: Every day | ORAL | 3 refills | Status: DC

## 2020-11-14 MED ORDER — METRONIDAZOLE 1 % EX GEL: Freq: Every day | CUTANEOUS | 1 refills | Status: DC

## 2020-11-14 MED ORDER — BUMETANIDE 1 MG PO TABS: 1.0000 mg | ORAL_TABLET | Freq: Every day | ORAL | 1 refills | Status: DC

## 2020-11-14 MED ORDER — LISINOPRIL 10 MG PO TABS: 10.0000 mg | ORAL_TABLET | Freq: Every day | ORAL | 1 refills | Status: DC

## 2020-11-14 NOTE — Telephone Encounter (Signed)
Pt seen today

## 2020-11-14 NOTE — Progress Notes (Signed)
Patient ID: Tracy Miles, female    DOB: 03-18-1934  Age: 87 y.o. MRN: [id number]    Subjective:  Subjective  HPI Tracy Miles presents for office visit today.  She complains of having a rash on her right lower leg that has resolved but she notes some concern. She mentions using different topical creams, but only mild relief. She endorses having redness, swelling, and itching on her cheeks and nose area. She has used peroxide, alcohol, witch hazel, and topical cream but no relieve. It has only caused a burning sensation in the areas she applies. She denies any chest pain, SOB, fever, abdominal pain, cough, chills, sore throat, dysuria, urinary incontinence, back pain, HA, or N/VD at this time.   The patient has a PMHx of Afib and reports that she experienced an episode that started 8 days ago(11/06/20) and ended 2 days ago(11/12/20). She was recommended to take half a pill of metoprolol 25 mg. She saw her cardiologist and he declines completing a cardiac ablation procedure secondary to risk factors. However she states that she had 3 episodes recently and is upset that this is the case and is doing her best to understand. She is still swimming 3 times a week, but has not increased her intensity due to her  AFIB dx. Although She is engaged in activities and hobbies to keep herself busy(book club).   She has a hx of mouth sores that will randomly appear after taking different medications. However after taking antibiotic her symptoms have resolved and have not return since the last incident.    Review of Systems  Constitutional: Negative for chills, fatigue and fever.  HENT: Positive for facial swelling (nose and cheek area). Negative for congestion, rhinorrhea, sinus pressure, sinus pain and sore throat.        (+)Nose redness and itching   Eyes: Negative for pain.  Respiratory: Negative for shortness of breath.   Cardiovascular: Negative for chest pain, palpitations and leg swelling.   Gastrointestinal: Negative for abdominal pain, blood in stool, diarrhea, nausea and vomiting.  Genitourinary: Negative for decreased urine volume, dysuria, flank pain, vaginal bleeding and vaginal discharge.  Skin: Positive for rash (right lower leg).       (+) Lesion on R lateral leg  Neurological: Negative for dizziness and headaches.    History Past Medical History:  Diagnosis Date  . Anemia   . Aortic stenosis    Very mild  . Benign paroxysmal positional vertigo 12/17/2013  . Chicken pox as a child  . Colon polyp   . Coronary artery disease   . DDD (degenerative disc disease) 11/16/2013  . Hyperlipidemia   . Hypertension   . Iron deficiency anemia 11/13/2013  . Mumps child and teenager  . OA (osteoarthritis) 11/19/2013   S/p L TKR  . Obesity   . OSA on CPAP   . Pancreatitis    post hysterectomy  . Persistent atrial fibrillation ([place])   . Personal history of colonic polyps 02/25/2014  . Personal history of DVT (deep vein thrombosis) X 2   "left"  . Sleep apnea   . Spinal stenosis     She has a past surgical history that includes Tonsilectomy, adenoidectomy, bilateral myringotomy and tubes (child); Lumbar laminectomy (40 yrs ago); Tubal ligation (87 years old); Total abdominal hysterectomy (1995); Replacement total knee (Left, 2013); Cyst removal hand (Bilateral, 1990's); Meniscus repair (Bilateral, 2000 and 2010); Appendectomy; Cataract extraction w/ intraocular lens  implant, bilateral (Bilateral, 2016); Dilation and curettage of uterus; Cardioversion (  N/A, 08/09/2015); Cardioversion (N/A, 09/30/2018); Cardioversion (N/A, 02/27/2019); TEE without cardioversion (N/A, 04/24/2019); ATRIAL FIBRILLATION ABLATION (N/A, 04/25/2019); Cardioversion (N/A, 06/07/2019); and implantable loop recorder placement (11/20/2019).   Her family history includes Atrial fibrillation in her son; COPD in her father; Cancer in her maternal grandfather and paternal grandmother; Cancer (age of onset: 53) in her  son; Cancer (age of onset: 65) in her father; Dementia in her mother; Diabetes in her maternal grandmother; Gout in her son and son; Heart attack (age of onset: 77) in her mother; Heart disease in her brother; Hyperlipidemia in her brother, mother, son, and son; Hypertension in her brother; Pernicious anemia in her maternal grandmother and mother; Sleep apnea in her son; Stroke in her paternal grandfather.She reports that she has never smoked. She has never used smokeless tobacco. She reports that she does not drink alcohol and does not use drugs.  Current Outpatient Medications on File Prior to Visit  Medication Sig Dispense Refill  . acetaminophen (TYLENOL) 325 MG tablet Take 325 mg by mouth daily as needed (pain.).    Marland Kitchen apixaban (ELIQUIS) 5 MG TABS tablet Take 1 tablet (5 mg total) by mouth in the morning and at bedtime. 180 tablet 1  . Calcium Carb-Cholecalciferol (CALCIUM 600 + D PO) Take 1 tablet by mouth 2 (two) times daily.    . Carboxymethylcellul-Glycerin (LUBRICATING EYE DROPS OP) Place 1 drop into both eyes in the morning and at bedtime.    Marland Kitchen diltiazem (CARDIZEM CD) 120 MG 24 hr capsule TAKE ONE CAPSULE BY MOUTH DAILY 60 capsule 6  . dofetilide (TIKOSYN) 250 MCG capsule TAKE ONE (1) CAPSULE BY MOUTH 2 TIMES DAILY 60 capsule 8  . loratadine (CLARITIN) 10 MG tablet Take 10 mg by mouth daily.     . metoprolol succinate (TOPROL-XL) 25 MG 24 hr tablet Take 1.5 tablets (37.5 mg total) by mouth 2 (two) times daily. 270 tablet 2  . Multiple Vitamins-Minerals (ONE-A-DAY WOMENS 50 PLUS PO) Take 1 tablet by mouth daily.    . potassium chloride (KLOR-CON) 10 MEQ tablet TAKE 1 TABLET BY MOUTH IN  THE MORNING AND 2 TABLETS  IN THE EVENING 270 tablet 3  . Probiotic CAPS Take 1 capsule by mouth daily.    . psyllium (REGULOID) 0.52 g capsule Take 0.52 g by mouth 2 (two) times daily.      No current facility-administered medications on file prior to visit.     Objective:  Objective  Physical  Exam Constitutional:      General: She is not in acute distress.    Appearance: Normal appearance. She is well-developed. She is not ill-appearing.  HENT:     Head: Normocephalic and atraumatic.     Right Ear: External ear normal.     Left Ear: External ear normal.     Nose: Nose normal.  Eyes:     Extraocular Movements: Extraocular movements intact.     Pupils: Pupils are equal, round, and reactive to light.  Cardiovascular:     Rate and Rhythm: Normal rate and regular rhythm.     Pulses: Normal pulses.     Heart sounds: Normal heart sounds. No murmur heard.   Pulmonary:     Effort: Pulmonary effort is normal.     Breath sounds: Normal breath sounds. No wheezing, rhonchi or rales.  Abdominal:     General: Bowel sounds are normal.     Palpations: Abdomen is soft. There is no mass.     Tenderness: There is no  abdominal tenderness. There is no guarding or rebound.  Musculoskeletal:     Cervical back: Normal range of motion and neck supple.  Skin:    General: Skin is warm and dry.     Findings: Lesion (right lateral aspect on lower extermity) present.     Comments: Redness all throughout cheeks and nos  Neurological:     Mental Status: She is alert and oriented to person, place, and time.  Psychiatric:        Behavior: Behavior normal.    BP 124/68 (BP Location: Left Arm, Patient Position: Sitting, Cuff Size: Large)   Pulse 67   Temp 98.1 F (36.7 C)   Resp 18   Ht 5\' 3"  (1.6 m)   Wt 281 lb (127.5 kg)   SpO2 98%   BMI 49.78 kg/m  Wt Readings from Last 3 Encounters:  11/14/20 281 lb (127.5 kg)  09/12/20 275 lb 6.4 oz (124.9 kg)  06/10/20 277 lb 12.8 oz (126 kg)     Lab Results  Component Value Date   WBC 7.1 11/14/2020   HGB 12.9 11/14/2020   HCT 38.1 11/14/2020   PLT 231.0 11/14/2020   GLUCOSE 101 (H) 11/14/2020   CHOL 154 11/14/2020   TRIG 159.0 (H) 11/14/2020   HDL 44.50 11/14/2020   LDLCALC 78 11/14/2020   ALT 9 11/14/2020   AST 14 11/14/2020   NA  141 11/14/2020   K 4.3 11/14/2020   CL 100 11/14/2020   CREATININE 1.00 11/14/2020   BUN 16 11/14/2020   CO2 31 11/14/2020   TSH 3.27 11/14/2020   INR 1.55 (H) 08/06/2015   HGBA1C 5.8 11/14/2020    MM 3D SCREEN BREAST BILATERAL  Result Date: 11/11/2020 CLINICAL DATA:  Screening. EXAM: DIGITAL SCREENING BILATERAL MAMMOGRAM WITH TOMOSYNTHESIS AND CAD TECHNIQUE: Bilateral screening digital craniocaudal and mediolateral oblique mammograms were obtained. Bilateral screening digital breast tomosynthesis was performed. The images were evaluated with computer-aided detection. COMPARISON:  Previous exam(s). ACR Breast Density Category b: There are scattered areas of fibroglandular density. FINDINGS: There are no findings suspicious for malignancy. The images were evaluated with computer-aided detection. IMPRESSION: No mammographic evidence of malignancy. A result letter of this screening mammogram will be mailed directly to the patient. RECOMMENDATION: Screening mammogram in one year. (Code:SM-B-01Y) BI-RADS CATEGORY  1: Negative. Electronically Signed   By: Lovey Newcomer M.D.   On: 11/11/2020 10:33     Assessment & Plan:  Plan    Meds ordered this encounter  Medications  . atorvastatin (LIPITOR) 20 MG tablet    Sig: Take 1 tablet (20 mg total) by mouth daily.    Dispense:  90 tablet    Refill:  3    Requesting 1 year supply  . bumetanide (BUMEX) 1 MG tablet    Sig: Take 1 tablet (1 mg total) by mouth daily.    Dispense:  90 tablet    Refill:  1  . lisinopril (ZESTRIL) 10 MG tablet    Sig: Take 1 tablet (10 mg total) by mouth daily.    Dispense:  90 tablet    Refill:  1  . metroNIDAZOLE (METROGEL) 1 % gel    Sig: Apply topically daily.    Dispense:  60 g    Refill:  1    Problem List Items Addressed This Visit    Atrial fibrillation [facility])    She has had a couple of episodes recently but is feeling better this week. She is working with cardiology but  is frustrated that she is not a  candidate for ablation.      Relevant Medications   atorvastatin (LIPITOR) 20 MG tablet   bumetanide (BUMEX) 1 MG tablet   lisinopril (ZESTRIL) 10 MG tablet   Essential hypertension    Well controlled, no changes to meds. Encouraged heart healthy diet such as the DASH diet and exercise as tolerated.       Relevant Medications   atorvastatin (LIPITOR) 20 MG tablet   bumetanide (BUMEX) 1 MG tablet   lisinopril (ZESTRIL) 10 MG tablet   Other Relevant Orders   CBC (Completed)   Comprehensive metabolic panel (Completed)   TSH (Completed)   Hyperlipidemia, mixed    Encouraged heart healthy diet, increase exercise, avoid trans fats, consider a krill oil cap daily. Tolerating atorvastatin      Relevant Medications   atorvastatin (LIPITOR) 20 MG tablet   bumetanide (BUMEX) 1 MG tablet   lisinopril (ZESTRIL) 10 MG tablet   Other Relevant Orders   Lipid panel (Completed)   Hyperglycemia    hgba1c acceptable, minimize simple carbs. Increase exercise as tolerated.       Relevant Orders   Hemoglobin A1c (Completed)   Sun-damaged skin    Referred to dermatology for further evaluation      Relevant Orders   Ambulatory referral to Dermatology   Rosacea - Primary    On nose and cheeks. Started on Metrogel daily and referred to dermatology      Relevant Orders   Ambulatory referral to Dermatology      Follow-up: Return in about 6 months (around 05/17/2021) for annual exam.   I,Alexis Bryant,acting as a scribe for [name], MD.,have documented all relevant documentation on the behalf of [name], MD,as directed by  [name], MD while in the presence of [name], MD.  Medical screening examination/treatment was performed by qualified clinical staff member and as supervising physician I was immediately available for consultation/collaboration. I have reviewed documentation and agree with assessment and plan.  [name], MD

## 2020-11-14 NOTE — Patient Instructions (Signed)

## 2020-11-15 DIAGNOSIS — L719 Rosacea, unspecified: Secondary | ICD-10-CM | POA: Insufficient documentation

## 2020-11-15 DIAGNOSIS — L578 Other skin changes due to chronic exposure to nonionizing radiation: Secondary | ICD-10-CM | POA: Insufficient documentation

## 2020-11-15 LAB — COMPREHENSIVE METABOLIC PANEL
ALT: 9 U/L (ref 0–35)
AST: 14 U/L (ref 0–37)
Albumin: 4.3 g/dL (ref 3.5–5.2)
Alkaline Phosphatase: 88 U/L (ref 39–117)
BUN: 16 mg/dL (ref 6–23)
CO2: 31 mEq/L (ref 19–32)
Calcium: 9.7 mg/dL (ref 8.4–10.5)
Chloride: 100 mEq/L (ref 96–112)
Creatinine, Ser: 1 mg/dL (ref 0.40–1.20)
GFR: 51.7 mL/min — ABNORMAL LOW (ref >60.00)
Glucose, Bld: 101 mg/dL — ABNORMAL HIGH (ref 70–99)
Potassium: 4.3 mEq/L (ref 3.5–5.1)
Sodium: 141 mEq/L (ref 135–145)
Total Bilirubin: 0.9 mg/dL (ref 0.2–1.2)
Total Protein: 6.9 g/dL (ref 6.0–8.3)

## 2020-11-15 LAB — CBC
HCT: 38.1 % (ref 36.0–46.0)
Hemoglobin: 12.9 g/dL (ref 12.0–15.0)
MCHC: 33.8 g/dL (ref 30.0–36.0)
MCV: 94.4 fl (ref 78.0–100.0)
Platelets: 231 10*3/uL (ref 150.0–400.0)
RBC: 4.03 Mil/uL (ref 3.87–5.11)
RDW: 12.8 % (ref 11.5–15.5)
WBC: 7.1 10*3/uL (ref 4.0–10.5)

## 2020-11-15 LAB — LIPID PANEL
Cholesterol: 154 mg/dL (ref 0–200)
HDL: 44.5 mg/dL (ref >39.00)
LDL Cholesterol: 78 mg/dL (ref 0–99)
NonHDL: 109.96
Total CHOL/HDL Ratio: 3
Triglycerides: 159 mg/dL — ABNORMAL HIGH (ref 0.0–149.0)
VLDL: 31.8 mg/dL (ref 0.0–40.0)

## 2020-11-15 LAB — TSH: TSH: 3.27 u[IU]/mL (ref 0.35–4.50)

## 2020-11-15 LAB — HEMOGLOBIN A1C: Hgb A1c MFr Bld: 5.8 % (ref 4.6–6.5)

## 2020-11-15 NOTE — Assessment & Plan Note (Signed)
Referred to dermatology for further evaluation.

## 2020-11-15 NOTE — Assessment & Plan Note (Signed)
On nose and cheeks. Started on Metrogel daily and referred to dermatology

## 2020-11-15 NOTE — Assessment & Plan Note (Signed)
Encouraged heart healthy diet, increase exercise, avoid trans fats, consider a krill oil cap daily. Tolerating atorvastatin

## 2020-11-15 NOTE — Assessment & Plan Note (Signed)
Well controlled, no changes to meds. Encouraged heart healthy diet such as the DASH diet and exercise as tolerated.  °

## 2020-11-15 NOTE — Assessment & Plan Note (Signed)
She has had a couple of episodes recently but is feeling better this week. She is working with cardiology but is frustrated that she is not a candidate for ablation.

## 2020-11-15 NOTE — Assessment & Plan Note (Signed)
hgba1c acceptable, minimize simple carbs. Increase exercise as tolerated.  

## 2020-11-23 LAB — CUP PACEART REMOTE DEVICE CHECK
Date Time Interrogation Session: 20220318230346
Implantable Pulse Generator Implant Date: 20210322

## 2020-11-25 ENCOUNTER — Ambulatory Visit (INDEPENDENT_AMBULATORY_CARE_PROVIDER_SITE_OTHER): Payer: Medicare Other

## 2020-11-25 DIAGNOSIS — I480 Paroxysmal atrial fibrillation: Secondary | ICD-10-CM

## 2020-12-06 NOTE — Progress Notes (Signed)
Carelink Summary Report / Loop Recorder 

## 2020-12-11 ENCOUNTER — Other Ambulatory Visit: Payer: Self-pay

## 2020-12-11 ENCOUNTER — Ambulatory Visit (INDEPENDENT_AMBULATORY_CARE_PROVIDER_SITE_OTHER): Payer: Medicare Other | Admitting: Internal Medicine

## 2020-12-11 ENCOUNTER — Encounter: Payer: Self-pay | Admitting: Internal Medicine

## 2020-12-11 VITALS — BP 126/78 | HR 79 | Ht 63.0 in | Wt 279.8 lb

## 2020-12-11 DIAGNOSIS — G4733 Obstructive sleep apnea (adult) (pediatric): Secondary | ICD-10-CM

## 2020-12-11 DIAGNOSIS — I4819 Other persistent atrial fibrillation: Secondary | ICD-10-CM

## 2020-12-11 DIAGNOSIS — D6869 Other thrombophilia: Secondary | ICD-10-CM

## 2020-12-11 DIAGNOSIS — I5032 Chronic diastolic (congestive) heart failure: Secondary | ICD-10-CM | POA: Diagnosis not present

## 2020-12-11 MED ORDER — DILTIAZEM HCL ER COATED BEADS 120 MG PO CP24: 120.0000 mg | ORAL_CAPSULE | Freq: Every day | ORAL | 3 refills | Status: DC

## 2020-12-11 NOTE — Progress Notes (Signed)
PCP: [name], MD   [name] is a 87 y.o. female who presents today for routine electrophysiology followup.  Since last being seen in our clinic, the patient reports doing very well.  Today, she denies symptoms of palpitations, chest pain, shortness of breath,  lower extremity edema, dizziness, presyncope, or syncope.  The patient is otherwise without complaint today.   Past Medical History:  Diagnosis Date  . Anemia   . Aortic stenosis    Very mild  . Benign paroxysmal positional vertigo 12/17/2013  . Chicken pox as a child  . Colon polyp   . Coronary artery disease   . DDD (degenerative disc disease) 11/16/2013  . Hyperlipidemia   . Hypertension   . Iron deficiency anemia 11/13/2013  . Mumps child and teenager  . OA (osteoarthritis) 11/19/2013   S/p L TKR  . Obesity   . OSA on CPAP   . Pancreatitis    post hysterectomy  . Persistent atrial fibrillation ([place])   . Personal history of colonic polyps 02/25/2014  . Personal history of DVT (deep vein thrombosis) X 2   "left"  . Sleep apnea   . Spinal stenosis    Past Surgical History:  Procedure Laterality Date  . APPENDECTOMY    . ATRIAL FIBRILLATION ABLATION N/A 04/25/2019   Procedure: ATRIAL FIBRILLATION ABLATION;  Surgeon: [name], MD;  Location: [facility];  Service: Cardiovascular;  Laterality: N/A;  . CARDIOVERSION N/A 08/09/2015   Procedure: CARDIOVERSION;  Surgeon: Jerline Pain, MD;  Location: [place];  Service: Cardiovascular;  Laterality: N/A;  . CARDIOVERSION N/A 09/30/2018   Procedure: CARDIOVERSION;  Surgeon: [name], MD;  Location: [facility] ENDOSCOPY;  Service: Cardiovascular;  Laterality: N/A;  . CARDIOVERSION N/A 02/27/2019   Procedure: CARDIOVERSION;  Surgeon: Buford Dresser, MD;  Location: [facility] ENDOSCOPY;  Service: Cardiovascular;  Laterality: N/A;  . CARDIOVERSION N/A 06/07/2019   Procedure: CARDIOVERSION;  Surgeon: [name], MD;   Location: [place];  Service: Cardiovascular;  Laterality: N/A;  . CATARACT EXTRACTION W/ INTRAOCULAR LENS  IMPLANT, BILATERAL Bilateral 2016  . CYST REMOVAL HAND Bilateral 1990's   "played to much golf"  . DILATION AND CURETTAGE OF UTERUS    . implantable loop recorder placement  11/20/2019   Medtronic Reveal Linq model LNQ 22 (model number [id number]) implanted in office by Dr Rayann Heman for afib management  . LUMBAR LAMINECTOMY  40 yrs ago   "L3-4"  . MENISCUS REPAIR Bilateral 2000 and 2010  . REPLACEMENT TOTAL KNEE Left 2013  . TEE WITHOUT CARDIOVERSION N/A 04/24/2019   Procedure: TRANSESOPHAGEAL ECHOCARDIOGRAM (TEE);  Surgeon: Acie Fredrickson Wonda Cheng, MD;  Location: [place];  Service: Cardiovascular;  Laterality: N/A;  . TONSILECTOMY, ADENOIDECTOMY, BILATERAL MYRINGOTOMY AND TUBES  child  . TOTAL ABDOMINAL HYSTERECTOMY  1995   had 2 tumors- benign  . TUBAL LIGATION  87 years old    ROS- all systems are reviewed and negatives except as per HPI above  Current Outpatient Medications  Medication Sig Dispense Refill  . acetaminophen (TYLENOL) 325 MG tablet Take 325 mg by mouth daily as needed (pain.).    Marland Kitchen apixaban (ELIQUIS) 5 MG TABS tablet Take 1 tablet (5 mg total) by mouth in the morning and at bedtime. 180 tablet 1  . atorvastatin (LIPITOR) 20 MG tablet Take 1 tablet (20 mg total) by mouth daily. 90 tablet 3  . bumetanide (BUMEX) 1 MG tablet Take 1 tablet (1 mg total)  by mouth daily. 90 tablet 1  . Calcium Carb-Cholecalciferol (CALCIUM 600 + D PO) Take 1 tablet by mouth 2 (two) times daily.    . Carboxymethylcellul-Glycerin (LUBRICATING EYE DROPS OP) Place 1 drop into both eyes in the morning and at bedtime.    Marland Kitchen diltiazem (CARDIZEM CD) 120 MG 24 hr capsule TAKE ONE CAPSULE BY MOUTH DAILY 60 capsule 6  . dofetilide (TIKOSYN) 250 MCG capsule TAKE ONE (1) CAPSULE BY MOUTH 2 TIMES DAILY 60 capsule 8  . lisinopril (ZESTRIL) 10 MG tablet Take 1 tablet (10 mg total) by mouth daily. 90  tablet 1  . loratadine (CLARITIN) 10 MG tablet Take 10 mg by mouth daily.     . metoprolol succinate (TOPROL-XL) 25 MG 24 hr tablet Take 1.5 tablets (37.5 mg total) by mouth 2 (two) times daily. 270 tablet 2  . metroNIDAZOLE (METROGEL) 1 % gel Apply topically daily. 60 g 1  . Multiple Vitamins-Minerals (ONE-A-DAY WOMENS 50 PLUS PO) Take 1 tablet by mouth daily.    . potassium chloride (KLOR-CON) 10 MEQ tablet TAKE 1 TABLET BY MOUTH IN  THE MORNING AND 2 TABLETS  IN THE EVENING 270 tablet 3  . Probiotic CAPS Take 1 capsule by mouth daily.    . psyllium (REGULOID) 0.52 g capsule Take 0.52 g by mouth 2 (two) times daily.      No current facility-administered medications for this visit.    Physical Exam: Vitals:   12/11/20 1522  BP: 126/78  Pulse: 79  SpO2: 96%  Weight: 279 lb 12.8 oz (126.9 kg)  Height: 5\' 3"  (1.6 m)    GEN- The patient is well appearing, alert and oriented x 3 today.   Head- normocephalic, atraumatic Eyes-  Sclera clear, conjunctiva pink Ears- hearing intact Oropharynx- clear Lungs- Clear to ausculation bilaterally, normal work of breathing Heart- Regular rate and rhythm, no murmurs, rubs or gallops, PMI not laterally displaced GI- soft, NT, ND, + BS Extremities- no clubbing, cyanosis, or edema  Wt Readings from Last 3 Encounters:  12/11/20 279 lb 12.8 oz (126.9 kg)  11/14/20 281 lb (127.5 kg)  09/12/20 275 lb 6.4 oz (124.9 kg)    EKG tracing ordered today is personally reviewed and shows sinus rhythm 79 bpm, PR 188 msec, QTc 449 msec  Assessment and Plan:  1. Persistent atrial fibrillation/ atrial flutter afib burden by ILR is 8% (previously 11.4%). S/p ablation 2020.  In the absence of lifestyle modification and given her very persistent AF prior to ablation, I think that she has done remarkably well with ablation.  I doubt additional ablation without lifestyle change would be valuable to her. Doing well with tikosyn.  We will need to follow her closely  on this medicine to avoid toxicity. chads2vasc score is 6.  She is on eliquis Labs 3/22 reviewed  2. HTN Stable No change required today  3. Obesity Body mass index is 49.56 kg/m. She is not making progress  4. OSA Compliant with CPAP  Risks, benefits and potential toxicities for medications prescribed and/or refilled reviewed with patient today.   Return to the AF clinic every 6 months  [name] MD, [facility] 12/11/2020 3:34 PM

## 2020-12-11 NOTE — Patient Instructions (Addendum)
Medication Instructions:  Your physician recommends that you continue on your current medications as directed. Please refer to the Current Medication list given to you today.  Labwork: None ordered.  Testing/Procedures: None ordered.  Follow-Up: Your physician wants you to follow-up in: 6 months with the Afib Clinic. They will contact you to schedule.    Any Other Special Instructions Will Be Listed Below (If Applicable).  If you need a refill on your cardiac medications before your next appointment, please call your pharmacy.

## 2020-12-17 DIAGNOSIS — L570 Actinic keratosis: Secondary | ICD-10-CM | POA: Diagnosis not present

## 2020-12-17 DIAGNOSIS — L821 Other seborrheic keratosis: Secondary | ICD-10-CM | POA: Diagnosis not present

## 2020-12-17 DIAGNOSIS — L718 Other rosacea: Secondary | ICD-10-CM | POA: Diagnosis not present

## 2020-12-30 ENCOUNTER — Ambulatory Visit (INDEPENDENT_AMBULATORY_CARE_PROVIDER_SITE_OTHER): Payer: Medicare Other

## 2020-12-30 DIAGNOSIS — I4819 Other persistent atrial fibrillation: Secondary | ICD-10-CM

## 2020-12-31 LAB — CUP PACEART REMOTE DEVICE CHECK
Date Time Interrogation Session: 20220430230240
Implantable Pulse Generator Implant Date: 20210322

## 2021-01-17 NOTE — Progress Notes (Signed)
Carelink Summary Report / Loop Recorder 

## 2021-02-02 LAB — CUP PACEART REMOTE DEVICE CHECK
Date Time Interrogation Session: 20220602230652
Implantable Pulse Generator Implant Date: 20210322

## 2021-02-03 ENCOUNTER — Ambulatory Visit (INDEPENDENT_AMBULATORY_CARE_PROVIDER_SITE_OTHER): Payer: Medicare Other

## 2021-02-03 DIAGNOSIS — I4819 Other persistent atrial fibrillation: Secondary | ICD-10-CM

## 2021-02-24 ENCOUNTER — Other Ambulatory Visit: Payer: Self-pay

## 2021-02-25 ENCOUNTER — Ambulatory Visit: Payer: Self-pay

## 2021-02-25 NOTE — Progress Notes (Signed)
Carelink Summary Report / Loop Recorder 

## 2021-03-08 DIAGNOSIS — Z20822 Contact with and (suspected) exposure to covid-19: Secondary | ICD-10-CM | POA: Diagnosis not present

## 2021-03-09 LAB — CUP PACEART REMOTE DEVICE CHECK
Date Time Interrogation Session: 20220705230742
Implantable Pulse Generator Implant Date: 20210322

## 2021-03-10 ENCOUNTER — Ambulatory Visit (INDEPENDENT_AMBULATORY_CARE_PROVIDER_SITE_OTHER): Payer: Medicare Other

## 2021-03-10 DIAGNOSIS — I480 Paroxysmal atrial fibrillation: Secondary | ICD-10-CM

## 2021-03-24 ENCOUNTER — Telehealth: Payer: Self-pay

## 2021-03-24 NOTE — Telephone Encounter (Signed)
LINQ alert received.  2 AF events, one ongoing, poor rate controll Burden 17%, pt. prescribed Eliquis, Diltiazem, Tikosyn, Metoprolol.  Patient reports she was aware of the onset on AF w/ RVR Saturday evening. States she has experienced increased fatigue but other than that, no other symptoms such as chest pain, shortness of breath, dizziness or lightheadedness. Reports she has this onset about once a month and it takes her about 4 days to get out of it. Reports compliance with medications on file including Elquis 5 mg BID, Tikosyn 250 mcg BID, Toprol - XL 37.5 mg BID, Cardizem CD 1250 mg daily.   when she goes into AF she increases her Toprol -XL 50 mg BID.  Patient would like clarification about why she still has the ILR if nothing else can be done? Patient states Dr. Rayann Heman has told her she is not a candidate for anymore ablations so she would like clarification if there is something else that can be done for her or is the AF something she will continue to have? Advised patient I will forward to AF clinic since she has seen them as well as Dr. Rayann Heman for clarification of ILR and a plan of care? Patient aware to call if any further symptoms arise.

## 2021-04-02 NOTE — Progress Notes (Signed)
Carelink Summary Report / Loop Recorder 

## 2021-04-05 NOTE — Telephone Encounter (Signed)
Monitoring is helpful to follow her Afib trend.  She is on [place].  Will help [place] decide if additional steps are required.  Continue to monitor.

## 2021-04-07 NOTE — Telephone Encounter (Signed)
Successful telephone encounter to [name] to discuss recommendations per Dr. Rayann Heman to continue to monitor loop for AF trends. Patient appreciative of explanation for continued monitoring. Discussed AF clinic recall for 06/15/21. Will continue to monitor loop transmission and follow AF burden.

## 2021-04-14 ENCOUNTER — Ambulatory Visit (INDEPENDENT_AMBULATORY_CARE_PROVIDER_SITE_OTHER): Payer: Medicare Other

## 2021-04-14 DIAGNOSIS — I4819 Other persistent atrial fibrillation: Secondary | ICD-10-CM | POA: Diagnosis not present

## 2021-04-16 LAB — CUP PACEART REMOTE DEVICE CHECK
Date Time Interrogation Session: 20220816071632
Implantable Pulse Generator Implant Date: 20210322

## 2021-04-23 IMAGING — DX CHEST - 2 VIEW
2 series · 2 of 2 positions shown · non-contrast
Comparison: 08/05/2015

CLINICAL DATA: Atrial fibrillation, shortness of breath.

EXAM:
CHEST - 2 VIEW

[chest pa]
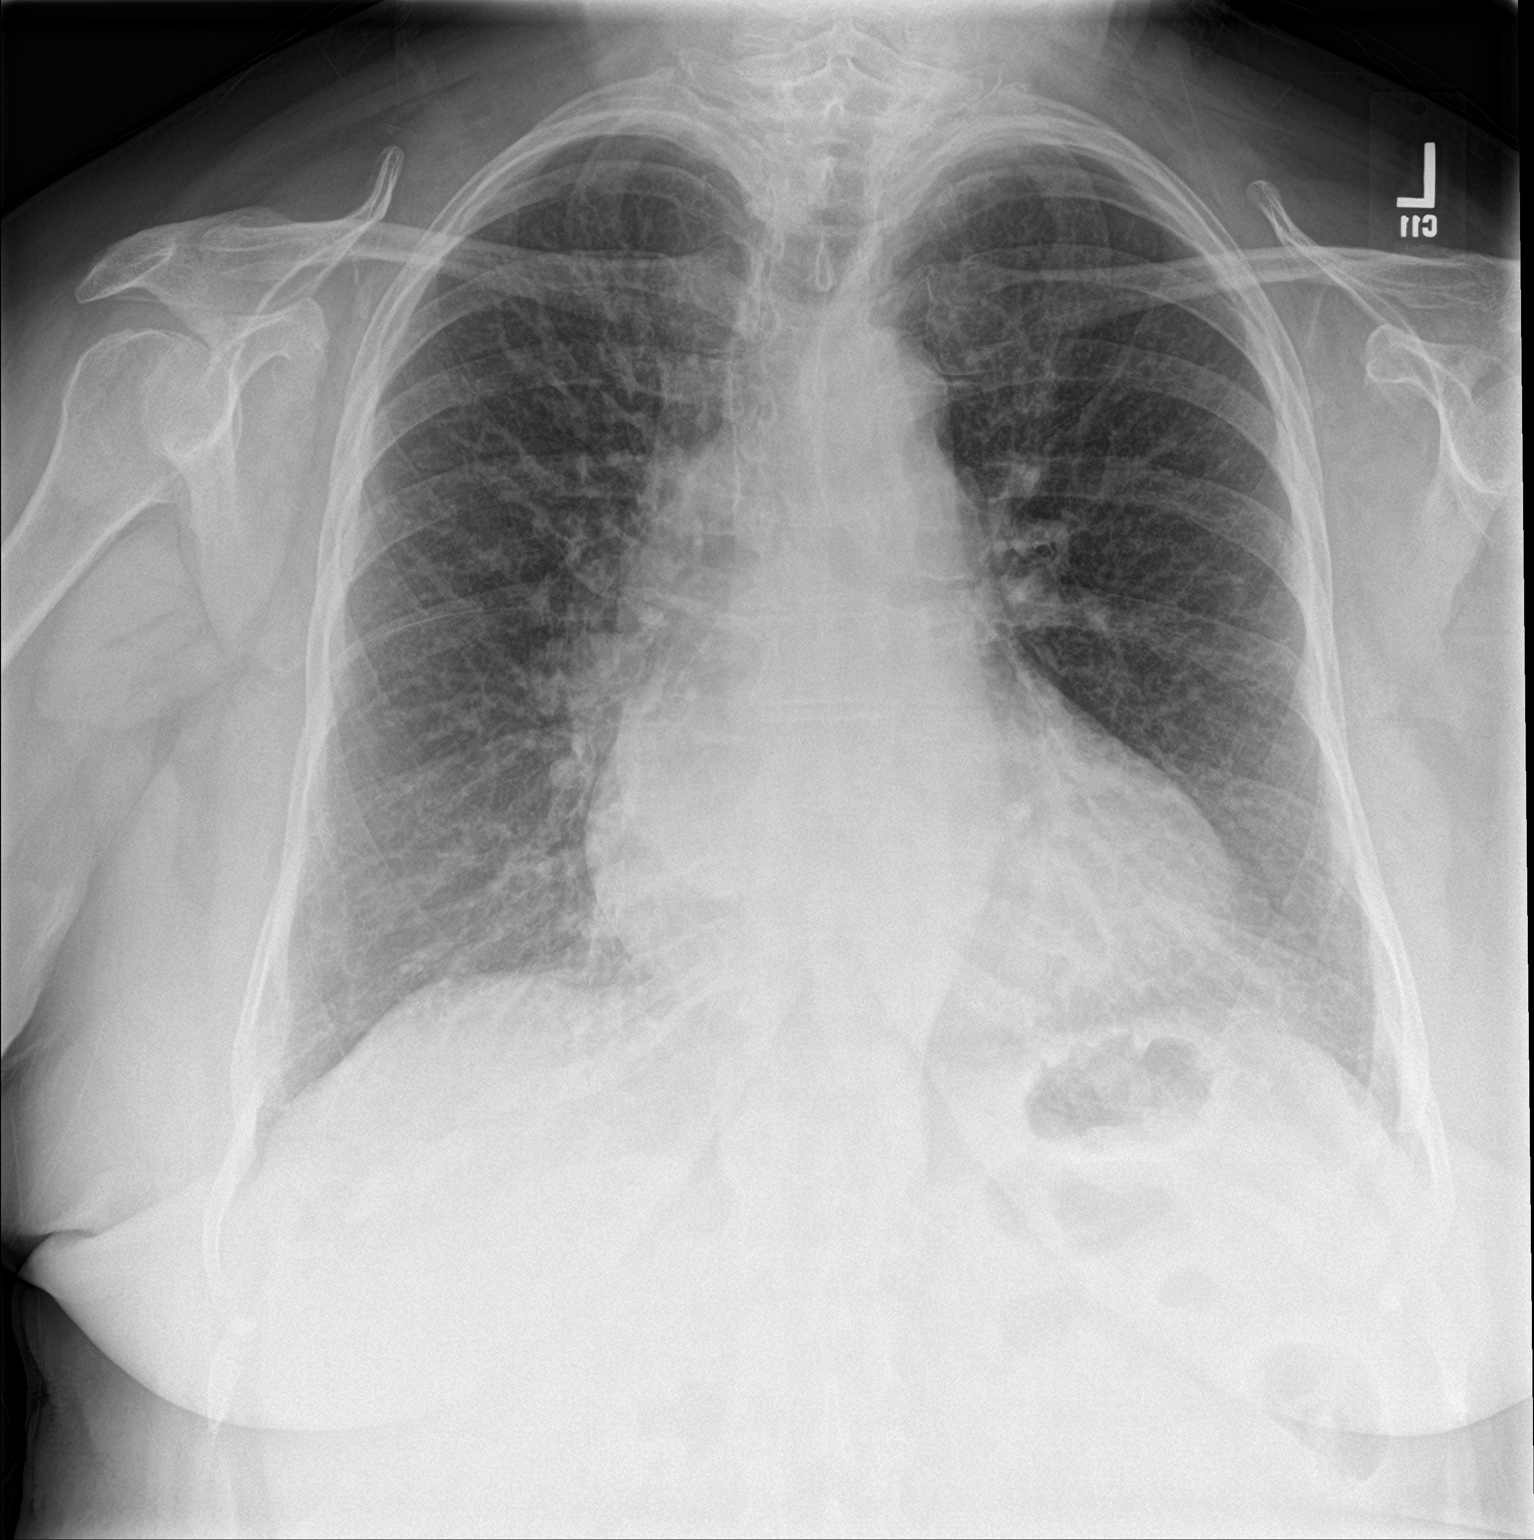

[chest lat]
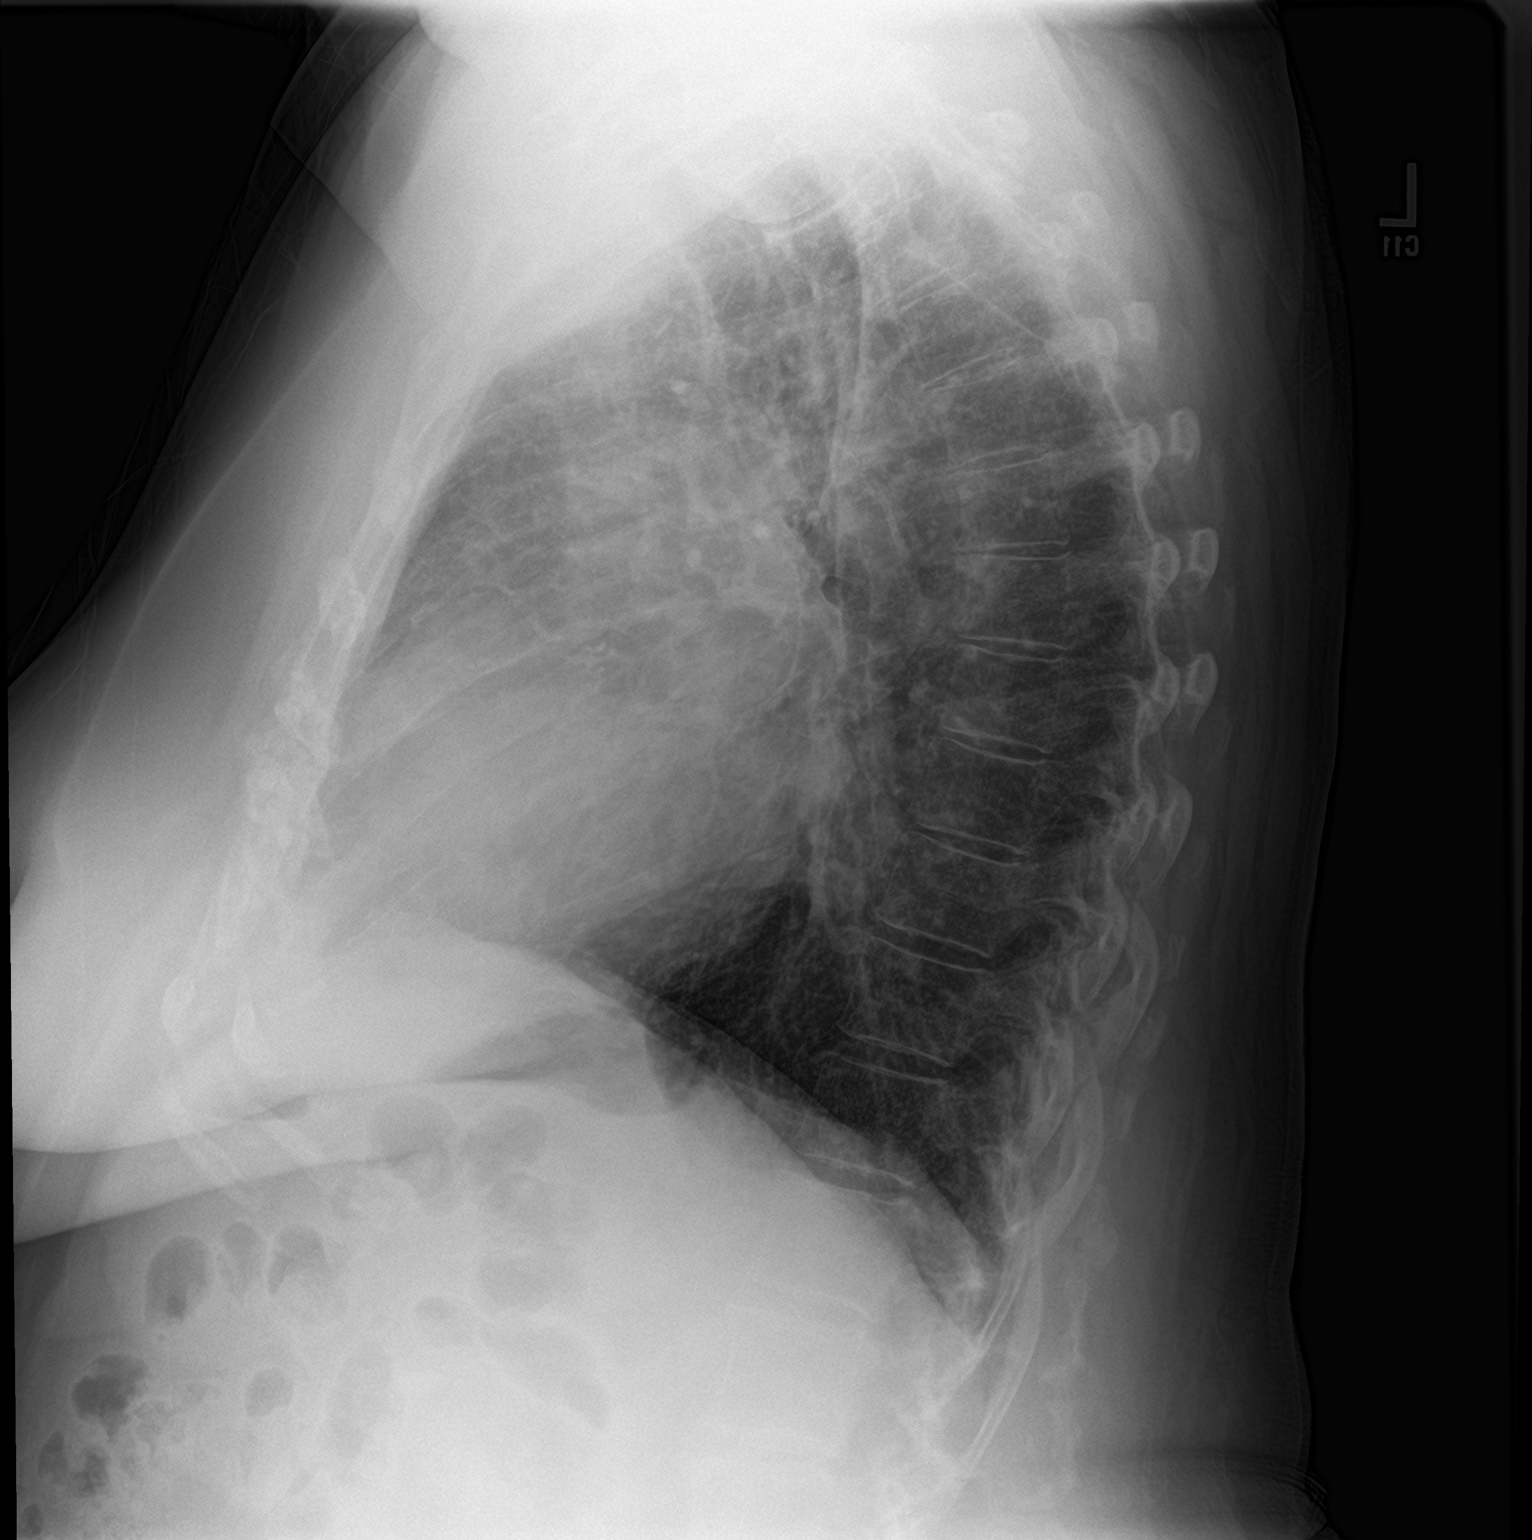

[2 of 2 positions shown; findings below may reference images not displayed]

FINDINGS: Improved interstitial prominence within the lungs. Mild residual
prominence persists. Heart is borderline in size. No effusions. No
acute bony abnormality.
IMPRESSION: Improved interstitial prominence in the lungs, likely improving
interstitial edema. A component of underlying chronic interstitial
change is likely.

Borderline heart size.

No active disease.

## 2021-05-02 NOTE — Progress Notes (Signed)
Carelink Summary Report / Loop Recorder 

## 2021-05-07 ENCOUNTER — Ambulatory Visit (INDEPENDENT_AMBULATORY_CARE_PROVIDER_SITE_OTHER): Payer: Medicare Other | Admitting: Family Medicine

## 2021-05-07 ENCOUNTER — Encounter: Payer: Self-pay | Admitting: Family Medicine

## 2021-05-07 ENCOUNTER — Other Ambulatory Visit: Payer: Self-pay

## 2021-05-07 VITALS — BP 140/82 | HR 77 | Temp 98.8°F | Ht 63.0 in | Wt 282.2 lb

## 2021-05-07 DIAGNOSIS — L304 Erythema intertrigo: Secondary | ICD-10-CM

## 2021-05-07 MED ORDER — CLOTRIMAZOLE-BETAMETHASONE 1-0.05 % EX CREA: 1.0000 "application " | TOPICAL_CREAM | Freq: Two times a day (BID) | CUTANEOUS | 0 refills | Status: DC

## 2021-05-07 NOTE — Patient Instructions (Addendum)
Try to keep things clean and dry.   I would avoid the pool for the next week or so.  Let us know if you need anything.

## 2021-05-07 NOTE — Progress Notes (Signed)
Chief Complaint  Patient presents with   Rash    Under left breast     Tracy Miles is a 87 y.o. female here for a skin complaint.  Duration: 1 week Location: under L breast Pruritic? Yes Painful? Yes- burning Drainage? No New soaps/lotions/topicals/detergents? No Sick contacts? No Other associated symptoms: no fevers Therapies tried thus far: Medicated Gold bond  Past Medical History:  Diagnosis Date   Anemia    Aortic stenosis    Very mild   Benign paroxysmal positional vertigo 12/17/2013   Chicken pox as a child   Colon polyp    Coronary artery disease    DDD (degenerative disc disease) 11/16/2013   Hyperlipidemia    Hypertension    Iron deficiency anemia 11/13/2013   Mumps child and teenager   OA (osteoarthritis) 11/19/2013   S/p L TKR   Obesity    OSA on CPAP    Pancreatitis    post hysterectomy   Persistent atrial fibrillation ([facility])    Personal history of colonic polyps 02/25/2014   Personal history of DVT (deep vein thrombosis) X 2   "left"   Sleep apnea    Spinal stenosis     BP 140/82   Pulse 77   Temp 98.8 F (37.1 C) (Oral)   Ht '5\' 3"'$  (1.6 m)   Wt 282 lb 4 oz (128 kg)   SpO2 98%   BMI 50.00 kg/m  Gen: awake, alert, appearing stated age Lungs: No accessory muscle use Skin: pink patch under L breast without excoriation, vesicles, drainage, sig ttp.  Psych: Age appropriate judgment and insight  Intertrigo - Plan: clotrimazole-betamethasone (LOTRISONE) cream  Clotrimazole for yeast, steroid for comfort. Keep c/d. Let me know if worsening.  F/u prn. The patient voiced understanding and agreement to the plan.  [place], DO 05/07/21 3:30 PM

## 2021-05-14 LAB — CUP PACEART REMOTE DEVICE CHECK
Date Time Interrogation Session: 20220909230445
Implantable Pulse Generator Implant Date: 20210322

## 2021-05-19 ENCOUNTER — Ambulatory Visit (INDEPENDENT_AMBULATORY_CARE_PROVIDER_SITE_OTHER): Payer: Medicare Other

## 2021-05-19 ENCOUNTER — Ambulatory Visit (INDEPENDENT_AMBULATORY_CARE_PROVIDER_SITE_OTHER): Payer: Medicare Other | Admitting: Family Medicine

## 2021-05-19 ENCOUNTER — Other Ambulatory Visit: Payer: Self-pay

## 2021-05-19 VITALS — BP 102/68 | HR 72 | Resp 18 | Ht 63.0 in | Wt 278.8 lb

## 2021-05-19 DIAGNOSIS — I4819 Other persistent atrial fibrillation: Secondary | ICD-10-CM | POA: Diagnosis not present

## 2021-05-19 DIAGNOSIS — I10 Essential (primary) hypertension: Secondary | ICD-10-CM

## 2021-05-19 DIAGNOSIS — R739 Hyperglycemia, unspecified: Secondary | ICD-10-CM | POA: Diagnosis not present

## 2021-05-19 DIAGNOSIS — E6609 Other obesity due to excess calories: Secondary | ICD-10-CM

## 2021-05-19 DIAGNOSIS — E782 Mixed hyperlipidemia: Secondary | ICD-10-CM

## 2021-05-19 DIAGNOSIS — R42 Dizziness and giddiness: Secondary | ICD-10-CM | POA: Diagnosis not present

## 2021-05-19 DIAGNOSIS — I5032 Chronic diastolic (congestive) heart failure: Secondary | ICD-10-CM | POA: Diagnosis not present

## 2021-05-19 DIAGNOSIS — I480 Paroxysmal atrial fibrillation: Secondary | ICD-10-CM

## 2021-05-19 DIAGNOSIS — K5909 Other constipation: Secondary | ICD-10-CM | POA: Diagnosis not present

## 2021-05-19 DIAGNOSIS — G4733 Obstructive sleep apnea (adult) (pediatric): Secondary | ICD-10-CM

## 2021-05-19 DIAGNOSIS — Z23 Encounter for immunization: Secondary | ICD-10-CM

## 2021-05-19 LAB — HEMOGLOBIN A1C: Hgb A1c MFr Bld: 6 % (ref 4.6–6.5)

## 2021-05-19 MED ORDER — APIXABAN 5 MG PO TABS: 5.0000 mg | ORAL_TABLET | Freq: Two times a day (BID) | ORAL | 1 refills | Status: DC

## 2021-05-19 MED ORDER — BUMETANIDE 1 MG PO TABS: 1.0000 mg | ORAL_TABLET | Freq: Every day | ORAL | 1 refills | Status: DC

## 2021-05-19 NOTE — Progress Notes (Signed)
Subjective:   By signing my name below, I, Zite Okoli, attest that this documentation has been prepared under the direction and in the presence of [name], MD. 05/19/2021    Patient ID: Tracy Miles, female    DOB: Sep 04, 1933, 87 y.o.   MRN: [id number]  Chief Complaint  Patient presents with   Annual Exam    Rash under left breast    HPI Patient is in today for an office visit.  She reports having a rash under her left breast that has lasted for 10 days and it is fading. She mentions she has been staying out of the pool and is not getting much exercise for now.  She reports she had several episodes of vertigo last week and thinks they are triggered when she is dehydrated. She denies falls and syncope. She has not had any episodes this week.  She recently went to the dermatologist and got injections for the rosacea on her legs. However, she is not satisfied with the way her legs look now and is still interested in going back for more visits.  She is still experiencing episodes of A-fib and has had about 10. She is still following up at cardiology and is still trying to figure out what triggers these episodes.   She still tries to get some exercise by swimming in the pool for an hour about 3x a week.   She is regularly using her CPAP machine to manage her sleep apnea and is doing well on it.  She is willing to get the flu vaccine today. She is going to get the Covid-19 booster vaccine at her retirement home.  There has been no recent changes in her family history.  Past Medical History:  Diagnosis Date   Anemia    Aortic stenosis    Very mild   Benign paroxysmal positional vertigo 12/17/2013   Chicken pox as a child   Colon polyp    Coronary artery disease    DDD (degenerative disc disease) 11/16/2013   Hyperlipidemia    Hypertension    Iron deficiency anemia 11/13/2013   Mumps child and teenager   OA (osteoarthritis) 11/19/2013   S/p L TKR   Obesity    OSA on  CPAP    Pancreatitis    post hysterectomy   Persistent atrial fibrillation (HCC)    Personal history of colonic polyps 02/25/2014   Personal history of DVT (deep vein thrombosis) X 2   "left"   Sleep apnea    Spinal stenosis     Past Surgical History:  Procedure Laterality Date   APPENDECTOMY     ATRIAL FIBRILLATION ABLATION N/A 04/25/2019   Procedure: ATRIAL FIBRILLATION ABLATION;  Surgeon: [name], MD;  Location: [facility];  Service: Cardiovascular;  Laterality: N/A;   CARDIOVERSION N/A 08/09/2015   Procedure: CARDIOVERSION;  Surgeon: Jerline Pain, MD;  Location: [facility];  Service: Cardiovascular;  Laterality: N/A;   CARDIOVERSION N/A 09/30/2018   Procedure: CARDIOVERSION;  Surgeon: [name], MD;  Location: [facility] ENDOSCOPY;  Service: Cardiovascular;  Laterality: N/A;   CARDIOVERSION N/A 02/27/2019   Procedure: CARDIOVERSION;  Surgeon: Buford Dresser, MD;  Location: [facility] ENDOSCOPY;  Service: Cardiovascular;  Laterality: N/A;   CARDIOVERSION N/A 06/07/2019   Procedure: CARDIOVERSION;  Surgeon: [name], MD;  Location: [facility] ENDOSCOPY;  Service: Cardiovascular;  Laterality: N/A;   CATARACT EXTRACTION W/ INTRAOCULAR LENS  IMPLANT, BILATERAL Bilateral 2016   CYST REMOVAL HAND Bilateral 1990's   "  played to much golf"   DILATION AND CURETTAGE OF UTERUS     implantable loop recorder placement  11/20/2019   Medtronic Reveal Linq model LNQ 22 (model number [id number]) implanted in office by Dr Rayann Heman for afib management   LUMBAR LAMINECTOMY  40 yrs ago   "L3-4"   MENISCUS REPAIR Bilateral 2000 and 2010   REPLACEMENT TOTAL KNEE Left 2013   TEE WITHOUT CARDIOVERSION N/A 04/24/2019   Procedure: TRANSESOPHAGEAL ECHOCARDIOGRAM (TEE);  Surgeon: Acie Fredrickson Wonda Cheng, MD;  Location: [facility] ENDOSCOPY;  Service: Cardiovascular;  Laterality: N/A;   TONSILECTOMY, ADENOIDECTOMY, BILATERAL MYRINGOTOMY AND TUBES  child   TOTAL ABDOMINAL HYSTERECTOMY  1995   had 2 tumors-  benign   TUBAL LIGATION  86 years old    Family History  Problem Relation Age of Onset   Heart attack Mother 42   Hyperlipidemia Mother        ?   Dementia Mother    Pernicious anemia Mother    COPD Father        smoker   Cancer Father 46       prostate   Heart disease Brother        quadruple bipass surgery   Hyperlipidemia Brother    Hypertension Brother    Diabetes Maternal Grandmother        type 2   Pernicious anemia Maternal Grandmother    Gout Son    Atrial fibrillation Son    Hyperlipidemia Son    Cancer Maternal Grandfather        liver   Cancer Paternal Grandmother        lung- doesn't think she smokes?   Stroke Paternal Grandfather    Cancer Son 3       non hodgin's lymphoma   Gout Son    Hyperlipidemia Son    Sleep apnea Son    Colon cancer Neg Hx    Pancreatic cancer Neg Hx    Stomach cancer Neg Hx    Throat cancer Neg Hx    Liver disease Neg Hx     Social History   Socioeconomic History   Marital status: Widowed    Spouse name: Not on file   Number of children: 3   Years of education: Not on file   Highest education level: Not on file  Occupational History   Occupation: retired Pharmacist, hospital  Tobacco Use   Smoking status: Never   Smokeless tobacco: Never   Tobacco comments:    never used tobacco  Substance and Sexual Activity   Alcohol use: No   Drug use: No   Sexual activity: Never    Comment: lives at [place], low sodium diet  Other Topics Concern   Not on file  Social History Narrative   Not on file   Social Determinants of Health   Financial Resource Strain: Not on file  Food Insecurity: Not on file  Transportation Needs: Not on file  Physical Activity: Not on file  Stress: Not on file  Social Connections: Not on file  Intimate Partner Violence: Not on file    Outpatient Medications Prior to Visit  Medication Sig Dispense Refill   acetaminophen (TYLENOL) 325 MG tablet Take 325 mg by mouth daily as needed (pain.).      atorvastatin (LIPITOR) 20 MG tablet Take 1 tablet (20 mg total) by mouth daily. 90 tablet 3   Calcium Carb-Cholecalciferol (CALCIUM 600 + D PO) Take 1 tablet by mouth 2 (two) times daily.  Carboxymethylcellul-Glycerin (LUBRICATING EYE DROPS OP) Place 1 drop into both eyes in the morning and at bedtime.     clotrimazole-betamethasone (LOTRISONE) cream Apply 1 application topically 2 (two) times daily. 45 g 0   diltiazem (CARDIZEM CD) 120 MG 24 hr capsule Take 1 capsule (120 mg total) by mouth daily. 90 capsule 3   dofetilide (TIKOSYN) 250 MCG capsule TAKE ONE (1) CAPSULE BY MOUTH 2 TIMES DAILY 60 capsule 8   lisinopril (ZESTRIL) 10 MG tablet Take 1 tablet (10 mg total) by mouth daily. 90 tablet 1   loratadine (CLARITIN) 10 MG tablet Take 10 mg by mouth daily.      metoprolol succinate (TOPROL-XL) 25 MG 24 hr tablet Take 1.5 tablets (37.5 mg total) by mouth 2 (two) times daily. 270 tablet 2   metroNIDAZOLE (METROGEL) 1 % gel Apply topically daily. 60 g 1   Multiple Vitamins-Minerals (ONE-A-DAY WOMENS 50 PLUS PO) Take 1 tablet by mouth daily.     potassium chloride (KLOR-CON) 10 MEQ tablet TAKE 1 TABLET BY MOUTH IN  THE MORNING AND 2 TABLETS  IN THE EVENING 270 tablet 3   Probiotic CAPS Take 1 capsule by mouth daily.     psyllium (REGULOID) 0.52 g capsule Take 0.52 g by mouth 2 (two) times daily.      apixaban (ELIQUIS) 5 MG TABS tablet Take 1 tablet (5 mg total) by mouth in the morning and at bedtime. 180 tablet 1   bumetanide (BUMEX) 1 MG tablet Take 1 tablet (1 mg total) by mouth daily. 90 tablet 1   No facility-administered medications prior to visit.    Allergies  Allergen Reactions   Gabapentin Swelling   Lactose Intolerance (Gi) Other (See Comments)    Bothers her stomach   Zebeta [Bisoprolol Fumarate] Nausea Only   Penicillins Hives and Rash    Did it involve swelling of the face/tongue/throat, SOB, or low BP? No Did it involve sudden or severe rash/hives, skin peeling, or any  reaction on the inside of your mouth or nose? Yes Did you need to seek medical attention at a hospital or doctor's office? No When did it last happen?       25+ years If all above answers are "NO", may proceed with cephalosporin use.    Sulfa Antibiotics Rash    Review of Systems  Constitutional:  Negative for fever and malaise/fatigue.  HENT:  Negative for congestion.   Eyes:  Negative for redness.  Respiratory:  Negative for shortness of breath.   Cardiovascular:  Negative for chest pain, palpitations and leg swelling.  Gastrointestinal:  Negative for abdominal pain, blood in stool and nausea.  Genitourinary:  Negative for dysuria and frequency.  Musculoskeletal:  Negative for falls.  Skin:  Positive for rash (under left breast).  Neurological:  Negative for dizziness, loss of consciousness and headaches.  Endo/Heme/Allergies:  Negative for polydipsia.  Psychiatric/Behavioral:  Negative for depression. The patient is not nervous/anxious.       Objective:    Physical Exam Constitutional:      General: She is not in acute distress.    Appearance: She is well-developed.  HENT:     Head: Normocephalic and atraumatic.     Right Ear: Tympanic membrane, ear canal and external ear normal.     Left Ear: Tympanic membrane, ear canal and external ear normal.  Eyes:     Conjunctiva/sclera: Conjunctivae normal.     Comments: No nystagmus  Neck:     Thyroid: No thyromegaly.  Cardiovascular:     Rate and Rhythm: Normal rate and regular rhythm.     Heart sounds: Normal heart sounds. No murmur heard. Pulmonary:     Effort: Pulmonary effort is normal. No respiratory distress.     Breath sounds: Normal breath sounds.  Chest:     Comments: Rash under left breast Abdominal:     General: Bowel sounds are normal. There is no distension.     Palpations: Abdomen is soft. There is no mass.     Tenderness: There is no abdominal tenderness.  Musculoskeletal:     Cervical back: Neck supple.      Comments: 5/5 strength in upper and lower extremities  Lymphadenopathy:     Cervical: No cervical adenopathy.  Skin:    General: Skin is warm and dry.  Neurological:     Mental Status: She is alert and oriented to person, place, and time.     Deep Tendon Reflexes:     Reflex Scores:      Patellar reflexes are 2+ on the right side and 2+ on the left side. Psychiatric:        Behavior: Behavior normal.    BP 102/68   Pulse 72   Resp 18   Ht '5\' 3"'$  (1.6 m)   Wt 278 lb 12.8 oz (126.5 kg)   SpO2 98%   BMI 49.39 kg/m  Wt Readings from Last 3 Encounters:  05/19/21 278 lb 12.8 oz (126.5 kg)  05/07/21 282 lb 4 oz (128 kg)  12/11/20 279 lb 12.8 oz (126.9 kg)    Diabetic Foot Exam - Simple   No data filed    Lab Results  Component Value Date   WBC 7.1 11/14/2020   HGB 12.9 11/14/2020   HCT 38.1 11/14/2020   PLT 231.0 11/14/2020   GLUCOSE 101 (H) 11/14/2020   CHOL 154 11/14/2020   TRIG 159.0 (H) 11/14/2020   HDL 44.50 11/14/2020   LDLCALC 78 11/14/2020   ALT 9 11/14/2020   AST 14 11/14/2020   NA 141 11/14/2020   K 4.3 11/14/2020   CL 100 11/14/2020   CREATININE 1.00 11/14/2020   BUN 16 11/14/2020   CO2 31 11/14/2020   TSH 3.27 11/14/2020   INR 1.55 (H) 08/06/2015   HGBA1C 6.0 05/19/2021    Lab Results  Component Value Date   TSH 3.27 11/14/2020   Lab Results  Component Value Date   WBC 7.1 11/14/2020   HGB 12.9 11/14/2020   HCT 38.1 11/14/2020   MCV 94.4 11/14/2020   PLT 231.0 11/14/2020   Lab Results  Component Value Date   NA 141 11/14/2020   K 4.3 11/14/2020   CO2 31 11/14/2020   GLUCOSE 101 (H) 11/14/2020   BUN 16 11/14/2020   CREATININE 1.00 11/14/2020   BILITOT 0.9 11/14/2020   ALKPHOS 88 11/14/2020   AST 14 11/14/2020   ALT 9 11/14/2020   PROT 6.9 11/14/2020   ALBUMIN 4.3 11/14/2020   CALCIUM 9.7 11/14/2020   ANIONGAP 10 09/12/2020   GFR 51.70 (L) 11/14/2020   Lab Results  Component Value Date   CHOL 154 11/14/2020   Lab Results   Component Value Date   HDL 44.50 11/14/2020   Lab Results  Component Value Date   LDLCALC 78 11/14/2020   Lab Results  Component Value Date   TRIG 159.0 (H) 11/14/2020   Lab Results  Component Value Date   CHOLHDL 3 11/14/2020   Lab Results  Component Value Date  HGBA1C 6.0 05/19/2021       Assessment & Plan:   Problem List Items Addressed This Visit     Atrial fibrillation [facility])    Following with cardiology. Doing well      Relevant Medications   apixaban (ELIQUIS) 5 MG TABS tablet   bumetanide (BUMEX) 1 MG tablet   Essential hypertension    Well controlled, no changes to meds. Encouraged heart healthy diet such as the DASH diet and exercise as tolerated.       Relevant Medications   apixaban (ELIQUIS) 5 MG TABS tablet   bumetanide (BUMEX) 1 MG tablet   Hyperlipidemia, mixed    Tolerating statin, encouraged heart healthy diet, avoid trans fats, minimize simple carbs and saturated fats. Increase exercise as tolerated      Relevant Medications   apixaban (ELIQUIS) 5 MG TABS tablet   bumetanide (BUMEX) 1 MG tablet   Other Relevant Orders   Lipid panel   Obesity    Maintain DASH or MIND diet, decrease po intake and increase exercise as tolerated. Needs 7-8 hours of sleep nightly. Avoid trans fats, eat small, frequent meals every 4-5 hours with lean proteins, complex carbs and healthy fats. Minimize simple carbs, high fat foods and processed foods      Constipation   Relevant Orders   CBC   Comprehensive metabolic panel   TSH   Obstructive sleep apnea    Uses CPAP and sleeps well with machine      Relevant Orders   CBC   Chronic diastolic HF (heart failure) ([place])    Well controlled, no changes to meds. Encouraged heart healthy diet such as the DASH diet and exercise as tolerated.       Relevant Medications   apixaban (ELIQUIS) 5 MG TABS tablet   bumetanide (BUMEX) 1 MG tablet   Hyperglycemia    hgba1c acceptable, minimize simple carbs. Increase  exercise as tolerated.      Relevant Orders   Comprehensive metabolic panel   TSH   Hemoglobin A1c (Completed)   Persistent atrial fibrillation (HCC)    Rate controlled, tolerating meds      Relevant Medications   apixaban (ELIQUIS) 5 MG TABS tablet   bumetanide (BUMEX) 1 MG tablet   Vertigo    She has had some episodes of feeling light headed with a sense of disequilibrium infrequently and she feels they are associated with days she lets herself get dehydrated so she is trying to hydrate better. If the episodes worsen or change she will let us know.       Other Visit Diagnoses     Need for influenza vaccination    -  Primary   Relevant Orders   Flu Vaccine QUAD High Dose(Fluad) (Completed)       Meds ordered this encounter  Medications   apixaban (ELIQUIS) 5 MG TABS tablet    Sig: Take 1 tablet (5 mg total) by mouth in the morning and at bedtime.    Dispense:  180 tablet    Refill:  1   bumetanide (BUMEX) 1 MG tablet    Sig: Take 1 tablet (1 mg total) by mouth daily.    Dispense:  90 tablet    Refill:  1    I,Zite Okoli,acting as a scribe for [name], MD.,have documented all relevant documentation on the behalf of [name], MD,as directed by  [name], MD while in the presence of [name], MD.   I, [name], MD.,  personally preformed the services described in this documentation.  All medical record entries made by the scribe were at my direction and in my presence.  I have reviewed the chart and discharge instructions (if applicable) and agree that the record reflects my personal performance and is accurate and complete. 05/19/2021

## 2021-05-19 NOTE — Assessment & Plan Note (Signed)
Tolerating statin, encouraged heart healthy diet, avoid trans fats, minimize simple carbs and saturated fats. Increase exercise as tolerated 

## 2021-05-19 NOTE — Patient Instructions (Addendum)
Paxlovid is the new COVID medication we can give you if you get COVID so make sure you test if you have symptoms because we have to treat by day 5 of symptoms for it to be effective. If you are positive let us know so we can treat. If a home test is negative and your symptoms are persistent get a PCR test. Can check testing locations at [email or web address] If you are positive we will make an appointment with [place] and we will send in Paxlovid if you would like it. Check with your pharmacy before we meet to confirm they have it in stock, if they do not then we can get the prescription at the [place] 85 Years and Older, Female Preventive care refers to lifestyle choices and visits with your health care provider that can promote health and wellness. This includes: A yearly physical exam. This is also called an annual wellness visit. Regular dental and eye exams. Immunizations. Screening for certain conditions. Healthy lifestyle choices, such as: Eating a healthy diet. Getting regular exercise. Not using drugs or products that contain nicotine and tobacco. Limiting alcohol use. What can I expect for my preventive care visit? Physical exam Your health care provider will check your: Height and weight. These may be used to calculate your BMI (body mass index). BMI is a measurement that tells if you are at a healthy weight. Heart rate and blood pressure. Body temperature. Skin for abnormal spots. Counseling Your health care provider may ask you questions about your: Past medical problems. Family's medical history. Alcohol, tobacco, and drug use. Emotional well-being. Home life and relationship well-being. Sexual activity. Diet, exercise, and sleep habits. History of falls. Memory and ability to understand (cognition). Work and work Statistician. Pregnancy and menstrual history. Access to firearms. What immunizations do I need? Vaccines are usually given  at various ages, according to a schedule. Your health care provider will recommend vaccines for you based on your age, medical history, and lifestyle or other factors, such as travel or where you work. What tests do I need? Blood tests Lipid and cholesterol levels. These may be checked every 5 years, or more often depending on your overall health. Hepatitis C test. Hepatitis B test. Screening Lung cancer screening. You may have this screening every year starting at age 85 if you have a 30-pack-year history of smoking and currently smoke or have quit within the past 15 years. Colorectal cancer screening. All adults should have this screening starting at age 85 and continuing until age 85. Your health care provider may recommend screening at age 40 if you are at increased risk. You will have tests every 1-10 years, depending on your results and the type of screening test. Diabetes screening. This is done by checking your blood sugar (glucose) after you have not eaten for a while (fasting). You may have this done every 1-3 years. Mammogram. This may be done every 1-2 years. Talk with your health care provider about how often you should have regular mammograms. Abdominal aortic aneurysm (AAA) screening. You may need this if you are a current or former smoker. BRCA-related cancer screening. This may be done if you have a family history of breast, ovarian, tubal, or peritoneal cancers. Other tests STD (sexually transmitted disease) testing, if you are at risk. Bone density scan. This is done to screen for osteoporosis. You may have this done starting at age 85. Talk with your health care provider about your test  results, treatment options, and if necessary, the need for more tests. Follow these instructions at home: Eating and drinking  Eat a diet that includes fresh fruits and vegetables, whole grains, lean protein, and low-fat dairy products. Limit your intake of foods with high amounts of  sugar, saturated fats, and salt. Take vitamin and mineral supplements as recommended by your health care provider. Do not drink alcohol if your health care provider tells you not to drink. If you drink alcohol: Limit how much you have to 0-1 drink a day. Be aware of how much alcohol is in your drink. In the [place] one drink equals one 12 oz bottle of beer (355 mL), one 5 oz glass of wine (148 mL), or one 1 oz glass of hard liquor (44 mL). Lifestyle Take daily care of your teeth and gums. Brush your teeth every morning and night with fluoride toothpaste. Floss one time each day. Stay active. Exercise for at least 30 minutes 5 or more days each week. Do not use any products that contain nicotine or tobacco, such as cigarettes, e-cigarettes, and chewing tobacco. If you need help quitting, ask your health care provider. Do not use drugs. If you are sexually active, practice safe sex. Use a condom or other form of protection in order to prevent STIs (sexually transmitted infections). Talk with your health care provider about taking a low-dose aspirin or statin. Find healthy ways to cope with stress, such as: Meditation, yoga, or listening to music. Journaling. Talking to a trusted person. Spending time with friends and family. Safety Always wear your seat belt while driving or riding in a vehicle. Do not drive: If you have been drinking alcohol. Do not ride with someone who has been drinking. When you are tired or distracted. While texting. Wear a helmet and other protective equipment during sports activities. If you have firearms in your house, make sure you follow all gun safety procedures. What's next? Visit your health care provider once a year for an annual wellness visit. Ask your health care provider how often you should have your eyes and teeth checked. Stay up to date on all vaccines. This information is not intended to replace advice given to you by your health care provider. Make  sure you discuss any questions you have with your health care provider. Document Revised: 10/25/2020 Document Reviewed: 08/11/2018 Elsevier Patient Education  2022 [place]. Atrial Fibrillation Atrial fibrillation is a type of irregular or rapid heartbeat (arrhythmia). In atrial fibrillation, the top part of the heart (atria) beats in an irregular pattern. This makes the heart unable to pump blood normally and effectively. The goal of treatment is to prevent blood clots from forming, control your heart rate, or restore your heartbeat to a normal rhythm. If this condition is not treated, it can cause serious problems, such as a weakened heart muscle (cardiomyopathy) or a stroke. What are the causes? This condition is often caused by medical conditions that damage the heart's electrical system. These include: High blood pressure (hypertension). This is the most common cause. Certain heart problems or conditions, such as heart failure, coronary artery disease, heart valve problems, or heart surgery. Diabetes. Overactive thyroid (hyperthyroidism). Obesity. Chronic kidney disease. In some cases, the cause of this condition is not known. What increases the risk? This condition is more likely to develop in: Older people. People who smoke. Athletes who do endurance exercise. People who have a family history of atrial fibrillation. Men. People who use drugs. People who drink  a lot of alcohol. People who have lung conditions, such as emphysema, pneumonia, or COPD. People who have obstructive sleep apnea. What are the signs or symptoms? Symptoms of this condition include: A feeling that your heart is racing or beating irregularly. Discomfort or pain in your chest. Shortness of breath. Sudden light-headedness or weakness. Tiring easily during exercise or activity. Fatigue. Syncope (fainting). Sweating. In some cases, there are no symptoms. How is this diagnosed? Your health care  provider may detect atrial fibrillation when taking your pulse. If detected, this condition may be diagnosed with: An electrocardiogram (ECG) to check electrical signals of the heart. An ambulatory cardiac monitor to record your heart's activity for a few days. A transthoracic echocardiogram (TTE) to create pictures of your heart. A transesophageal echocardiogram (TEE) to create even closer pictures of your heart. A stress test to check your blood supply while you exercise. Imaging tests, such as a CT scan or chest X-ray. Blood tests. How is this treated? Treatment depends on underlying conditions and how you feel when you experience atrial fibrillation. This condition may be treated with: Medicines to prevent blood clots or to treat heart rate or heart rhythm problems. Electrical cardioversion to reset the heart's rhythm. A pacemaker to correct abnormal heart rhythm. Ablation to remove the heart tissue that sends abnormal signals. Left atrial appendage closure to seal the area where blood clots can form. In some cases, underlying conditions will be treated. Follow these instructions at home: Medicines Take over-the counter and prescription medicines only as told by your health care provider. Do not take any new medicines without talking to your health care provider. If you are taking blood thinners: Talk with your health care provider before you take any medicines that contain aspirin or NSAIDs, such as ibuprofen. These medicines increase your risk for dangerous bleeding. Take your medicine exactly as told, at the same time every day. Avoid activities that could cause injury or bruising, and follow instructions about how to prevent falls. Wear a medical alert bracelet or carry a card that lists what medicines you take. Lifestyle   Do not use any products that contain nicotine or tobacco, such as cigarettes, e-cigarettes, and chewing tobacco. If you need help quitting, ask your health care  provider. Eat heart-healthy foods. Talk with a dietitian to make an eating plan that is right for you. Exercise regularly as told by your health care provider. Do not drink alcohol. Lose weight if you are overweight. Do not use drugs, including cannabis. General instructions If you have obstructive sleep apnea, manage your condition as told by your health care provider. Do not use diet pills unless your health care provider approves. Diet pills can make heart problems worse. Keep all follow-up visits as told by your health care provider. This is important. Contact a health care provider if you: Notice a change in the rate, rhythm, or strength of your heartbeat. Are taking a blood thinner and you notice more bruising. Tire more easily when you exercise or do heavy work. Have a sudden change in weight. Get help right away if you have:  Chest pain, abdominal pain, sweating, or weakness. Trouble breathing. Side effects of blood thinners, such as blood in your vomit, stool, or urine, or bleeding that cannot stop. Any symptoms of a stroke. "BE FAST" is an easy way to remember the main warning signs of a stroke: B - Balance. Signs are dizziness, sudden trouble walking, or loss of balance. E - Eyes. Signs are trouble  seeing or a sudden change in vision. F - Face. Signs are sudden weakness or numbness of the face, or the face or eyelid drooping on one side. A - Arms. Signs are weakness or numbness in an arm. This happens suddenly and usually on one side of the body. S - Speech. Signs are sudden trouble speaking, slurred speech, or trouble understanding what people say. T - Time. Time to call emergency services. Write down what time symptoms started. Other signs of a stroke, such as: A sudden, severe headache with no known cause. Nausea or vomiting. Seizure. These symptoms may represent a serious problem that is an emergency. Do not wait to see if the symptoms will go away. Get medical help  right away. Call your local emergency services (911 in the [place] Do not drive yourself to the hospital. Summary Atrial fibrillation is a type of irregular or rapid heartbeat (arrhythmia). Symptoms include a feeling that your heart is beating fast or irregularly. You may be given medicines to prevent blood clots or to treat heart rate or heart rhythm problems. Get help right away if you have signs or symptoms of a stroke. Get help right away if you cannot catch your breath or have chest pain or pressure. This information is not intended to replace advice given to you by your health care provider. Make sure you discuss any questions you have with your health care provider. Document Revised: 02/08/2019 Document Reviewed: 02/08/2019 Elsevier Patient Education  [place].

## 2021-05-19 NOTE — Assessment & Plan Note (Deleted)
Well controlled, no changes to meds. Encouraged heart healthy diet such as the DASH diet and exercise as tolerated.  °

## 2021-05-19 NOTE — Assessment & Plan Note (Signed)
Uses CPAP and sleeps well with machine

## 2021-05-19 NOTE — Assessment & Plan Note (Signed)
hgba1c acceptable, minimize simple carbs. Increase exercise as tolerated.  

## 2021-05-19 NOTE — Assessment & Plan Note (Signed)
Well controlled, no changes to meds. Encouraged heart healthy diet such as the DASH diet and exercise as tolerated.  °

## 2021-05-19 NOTE — Assessment & Plan Note (Signed)
Following with cardiology. Doing well.

## 2021-05-23 DIAGNOSIS — R42 Dizziness and giddiness: Secondary | ICD-10-CM | POA: Insufficient documentation

## 2021-05-23 NOTE — Progress Notes (Signed)
Carelink Summary Report / Loop Recorder 

## 2021-05-23 NOTE — Assessment & Plan Note (Signed)
She has had some episodes of feeling light headed with a sense of disequilibrium infrequently and she feels they are associated with days she lets herself get dehydrated so she is trying to hydrate better. If the episodes worsen or change she will let us know.

## 2021-05-23 NOTE — Assessment & Plan Note (Signed)
Maintain DASH or MIND diet, decrease po intake and increase exercise as tolerated. Needs 7-8 hours of sleep nightly. Avoid trans fats, eat small, frequent meals every 4-5 hours with lean proteins, complex carbs and healthy fats. Minimize simple carbs, high fat foods and processed foods  

## 2021-05-23 NOTE — Assessment & Plan Note (Signed)
Rate controlled, tolerating meds 

## 2021-05-23 NOTE — Assessment & Plan Note (Signed)
Well controlled, no changes to meds. Encouraged heart healthy diet such as the DASH diet and exercise as tolerated.  °

## 2021-06-16 ENCOUNTER — Other Ambulatory Visit (HOSPITAL_COMMUNITY): Payer: Self-pay | Admitting: Nurse Practitioner

## 2021-06-16 ENCOUNTER — Ambulatory Visit (INDEPENDENT_AMBULATORY_CARE_PROVIDER_SITE_OTHER): Payer: Medicare Other

## 2021-06-16 DIAGNOSIS — I4819 Other persistent atrial fibrillation: Secondary | ICD-10-CM

## 2021-06-17 LAB — CUP PACEART REMOTE DEVICE CHECK
Date Time Interrogation Session: 20221012230844
Implantable Pulse Generator Implant Date: 20210322

## 2021-06-18 ENCOUNTER — Encounter (HOSPITAL_COMMUNITY): Payer: Self-pay | Admitting: Nurse Practitioner

## 2021-06-18 ENCOUNTER — Other Ambulatory Visit: Payer: Self-pay

## 2021-06-18 ENCOUNTER — Ambulatory Visit (HOSPITAL_COMMUNITY)
Admission: RE | Admit: 2021-06-18 | Discharge: 2021-06-18 | Disposition: A | Discharge status: Home / Self Care | Payer: Medicare Other | Source: Ambulatory Visit | Attending: Nurse Practitioner | Admitting: Nurse Practitioner

## 2021-06-18 VITALS — BP 174/94 | HR 81 | Ht 63.0 in | Wt 283.6 lb

## 2021-06-18 DIAGNOSIS — I4819 Other persistent atrial fibrillation: Secondary | ICD-10-CM | POA: Diagnosis not present

## 2021-06-18 DIAGNOSIS — I10 Essential (primary) hypertension: Secondary | ICD-10-CM | POA: Insufficient documentation

## 2021-06-18 DIAGNOSIS — I4892 Unspecified atrial flutter: Secondary | ICD-10-CM | POA: Insufficient documentation

## 2021-06-18 DIAGNOSIS — G4733 Obstructive sleep apnea (adult) (pediatric): Secondary | ICD-10-CM | POA: Insufficient documentation

## 2021-06-18 DIAGNOSIS — D6869 Other thrombophilia: Secondary | ICD-10-CM | POA: Diagnosis not present

## 2021-06-18 DIAGNOSIS — Z79899 Other long term (current) drug therapy: Secondary | ICD-10-CM | POA: Insufficient documentation

## 2021-06-18 DIAGNOSIS — Z7901 Long term (current) use of anticoagulants: Secondary | ICD-10-CM | POA: Insufficient documentation

## 2021-06-18 LAB — BASIC METABOLIC PANEL
Anion gap: 7 (ref 5–15)
BUN: 20 mg/dL (ref 8–23)
CO2: 30 mmol/L (ref 22–32)
Calcium: 9.5 mg/dL (ref 8.9–10.3)
Chloride: 100 mmol/L (ref 98–111)
Creatinine, Ser: 0.93 mg/dL (ref 0.44–1.00)
GFR, Estimated: >60 mL/min (ref >60)
Glucose, Bld: 102 mg/dL — ABNORMAL HIGH (ref 70–99)
Potassium: 4.1 mmol/L (ref 3.5–5.1)
Sodium: 137 mmol/L (ref 135–145)

## 2021-06-18 LAB — MAGNESIUM: Magnesium: 2.4 mg/dL (ref 1.7–2.4)

## 2021-06-18 MED ORDER — DILTIAZEM HCL 30 MG PO TABS: ORAL_TABLET | ORAL | 1 refills | Status: DC

## 2021-06-18 MED ORDER — METOPROLOL SUCCINATE ER 25 MG PO TB24: 37.5000 mg | ORAL_TABLET | Freq: Two times a day (BID) | ORAL | 2 refills | Status: DC

## 2021-06-18 NOTE — Patient Instructions (Signed)
Start Diltiazem 30mg  Take 1 Tablet Every 4 Hours As Needed For HR >100 and Top BP >100

## 2021-06-18 NOTE — Addendum Note (Signed)
Encounter addended by: Enid Derry, CMA on: 06/18/2021 4:14 PM  Actions taken: Order list changed, Pharmacy for encounter modified

## 2021-06-18 NOTE — Progress Notes (Addendum)
PCP: [name], MD  [name] is a 87 y.o. female who presents today for  electrophysiology follow up afib ablation 6 weeks ago. She had developed medicine refractory atrial fibrillation. She had  hematoma of the abdomen and both groins that gradually cleared. She continued  on tikosyn.  She has had intermittent afib since the procedure and was in afib  at one month f/u. She continued to watch her heart rates for the last couple of weeks and is consistently out of rhythm. She has been able to return to exercising in the pool and this has made her feel better. Her v rates are quite elevated in the 120's. Higher does of BB in the past have not helped and increased dose of diltiazem increases her swelling.  No swallowing issues since the procedure.   F/u in afib clinic,10/15, she is s/p successful cardioversion and remains in SR. She is very happy about this and is feeling much improved.   F/u in afib clinic, 05/02/20. She has received a Linq since I saw her last, and the device clinic let her know yesterday that she was out of rhythm. EKG today shows atrial flutter at 124 ms. She continues on eliquis and tikosyn. CHA2DS2VASc score is  6.No trigger that pt is aware of. She did increase metoprolol to 50 mg bid.   F/u in afib clinic, 06/18/21. She is in SR.  She will still have days of afib, will usually convert back to SR with extra metoprolol but that makes her feel very tired. She continues on Tikosyn. Last paceart showed afib burden fairly  low at 7.6%.   Today, she denies symptoms of palpitations, chest pain,   lower extremity edema, dizziness, presyncope, or syncope.  The patient is otherwise without complaint today.   Past Medical History:  Diagnosis Date   Anemia    Aortic stenosis    Very mild   Benign paroxysmal positional vertigo 12/17/2013   Chicken pox as a child   Colon polyp    Coronary artery disease    DDD (degenerative disc disease) 11/16/2013    Hyperlipidemia    Hypertension    Iron deficiency anemia 11/13/2013   Mumps child and teenager   OA (osteoarthritis) 11/19/2013   S/p L TKR   Obesity    OSA on CPAP    Pancreatitis    post hysterectomy   Persistent atrial fibrillation (HCC)    Personal history of colonic polyps 02/25/2014   Personal history of DVT (deep vein thrombosis) X 2   "left"   Sleep apnea    Spinal stenosis    Past Surgical History:  Procedure Laterality Date   APPENDECTOMY     ATRIAL FIBRILLATION ABLATION N/A 04/25/2019   Procedure: ATRIAL FIBRILLATION ABLATION;  Surgeon: [name], MD;  Location: [facility];  Service: Cardiovascular;  Laterality: N/A;   CARDIOVERSION N/A 08/09/2015   Procedure: CARDIOVERSION;  Surgeon: Jerline Pain, MD;  Location: [facility];  Service: Cardiovascular;  Laterality: N/A;   CARDIOVERSION N/A 09/30/2018   Procedure: CARDIOVERSION;  Surgeon: [name], MD;  Location: [facility] ENDOSCOPY;  Service: Cardiovascular;  Laterality: N/A;   CARDIOVERSION N/A 02/27/2019   Procedure: CARDIOVERSION;  Surgeon: Buford Dresser, MD;  Location: [facility] ENDOSCOPY;  Service: Cardiovascular;  Laterality: N/A;   CARDIOVERSION N/A 06/07/2019   Procedure: CARDIOVERSION;  Surgeon: [name], MD;  Location: [facility];  Service: Cardiovascular;  Laterality: N/A;   CATARACT EXTRACTION  W/ INTRAOCULAR LENS  IMPLANT, BILATERAL Bilateral 2016   CYST REMOVAL HAND Bilateral 1990's   "played to much golf"   DILATION AND CURETTAGE OF UTERUS     implantable loop recorder placement  11/20/2019   Medtronic Reveal Linq model LNQ 22 (model number [id number]) implanted in office by Dr Rayann Heman for afib management   LUMBAR LAMINECTOMY  40 yrs ago   "L3-4"   MENISCUS REPAIR Bilateral 2000 and 2010   REPLACEMENT TOTAL KNEE Left 2013   TEE WITHOUT CARDIOVERSION N/A 04/24/2019   Procedure: TRANSESOPHAGEAL ECHOCARDIOGRAM (TEE);  Surgeon: Thayer Headings, MD;  Location: [facility] ENDOSCOPY;  Service:  Cardiovascular;  Laterality: N/A;   TONSILECTOMY, ADENOIDECTOMY, BILATERAL MYRINGOTOMY AND TUBES  child   TOTAL ABDOMINAL HYSTERECTOMY  1995   had 2 tumors- benign   TUBAL LIGATION  87 years old    ROS- all systems are reviewed and negatives except as per HPI above  Current Outpatient Medications  Medication Sig Dispense Refill   acetaminophen (TYLENOL) 325 MG tablet Take 325 mg by mouth daily as needed (pain.).     apixaban (ELIQUIS) 5 MG TABS tablet Take 1 tablet (5 mg total) by mouth in the morning and at bedtime. 180 tablet 1   atorvastatin (LIPITOR) 20 MG tablet Take 1 tablet (20 mg total) by mouth daily. 90 tablet 3   bumetanide (BUMEX) 1 MG tablet Take 1 tablet (1 mg total) by mouth daily. 90 tablet 1   Calcium Carb-Cholecalciferol (CALCIUM 600 + D PO) Take 1 tablet by mouth 2 (two) times daily.     Carboxymethylcellul-Glycerin (LUBRICATING EYE DROPS OP) Place 1 drop into both eyes in the morning and at bedtime.     clotrimazole-betamethasone (LOTRISONE) cream Apply 1 application topically 2 (two) times daily. 45 g 0   diltiazem (CARDIZEM CD) 120 MG 24 hr capsule Take 1 capsule (120 mg total) by mouth daily. 90 capsule 3   diltiazem (CARDIZEM) 30 MG tablet Take 1 Tablet Every 4 Hours As Needed For HR >100 and Top BP >100 45 tablet 1   dofetilide (TIKOSYN) 250 MCG capsule TAKE ONE (1) CAPSULE BY MOUTH 2 TIMES DAILY 60 capsule 11   lisinopril (ZESTRIL) 10 MG tablet Take 1 tablet (10 mg total) by mouth daily. 90 tablet 1   loratadine (CLARITIN) 10 MG tablet Take 10 mg by mouth daily.      metoprolol succinate (TOPROL-XL) 25 MG 24 hr tablet Take 1.5 tablets (37.5 mg total) by mouth 2 (two) times daily. 270 tablet 2   metroNIDAZOLE (METROGEL) 1 % gel Apply topically daily. 60 g 1   Multiple Vitamins-Minerals (ONE-A-DAY WOMENS 50 PLUS PO) Take 1 tablet by mouth daily.     potassium chloride (KLOR-CON) 10 MEQ tablet TAKE 1 TABLET BY MOUTH IN  THE MORNING AND 2 TABLETS  IN THE EVENING 270  tablet 3   Probiotic CAPS Take 1 capsule by mouth daily.     psyllium (REGULOID) 0.52 g capsule Take 0.52 g by mouth 2 (two) times daily.      No current facility-administered medications for this encounter.    Physical Exam: Vitals:   06/18/21 1342  BP: (!) 174/94  Pulse: 81  Weight: 128.6 kg  Height: 5\' 3"  (1.6 m)    GEN- The patient is well appearing, alert and oriented x 3 today.   Head- normocephalic, atraumatic Eyes-  Sclera clear, conjunctiva pink Ears- hearing intact Oropharynx- clear Lungs- Clear to ausculation bilaterally, normal work of breathing Heart- tachycardic  irregular rhythm GI- soft, NT, ND, + BS Extremities- no clubbing, cyanosis, or edema  Wt Readings from Last 3 Encounters:  06/18/21 128.6 kg  05/19/21 126.5 kg  05/07/21 128 kg    EKG -atrial flutter with variable rate at 124 bpm   Recent echo is also reviewed  Assessment and Plan:  1. Persistent atrial fibrillation/flutter  S/p afib ablation 04/25/19 Required CV right after ablation but since then has enjoyed SR with occasional breakthrough, will usually self convert, but the extra metoprolol to convert will make her extra tired Continue Tikosyn/diltaizem /metorpolol  I will prescribe 30 mg Cardizem to use as needed for afib to see if she tolerates better  Bmet/mag today for tikosyn surveillance   2.OSA She reports compliance with CPAP  3. HTN Elevated today Recheck at ome later today   F/u   6 months in afib clinic for [place]. Mayerli Kirst, [facility] [address] [place], [place] [address] [phone]

## 2021-06-19 ENCOUNTER — Ambulatory Visit (INDEPENDENT_AMBULATORY_CARE_PROVIDER_SITE_OTHER): Payer: Medicare Other

## 2021-06-19 VITALS — BP 144/76 | HR 87 | Temp 97.8°F | Resp 16 | Ht 63.0 in | Wt 282.8 lb

## 2021-06-19 DIAGNOSIS — Z0000 Encounter for general adult medical examination without abnormal findings: Secondary | ICD-10-CM

## 2021-06-19 NOTE — Patient Instructions (Addendum)
Tracy Miles , Thank you for taking time to come for your Medicare Wellness Visit. I appreciate your ongoing commitment to your health goals. Please review the following plan we discussed and let me know if I can assist you in the future.   Screening recommendations/referrals: Colonoscopy: No longer required Mammogram: Completed 11/11/2020-Due 11/11/2021 Bone Density: Declined  Recommended yearly ophthalmology/optometry visit for glaucoma screening and checkup Recommended yearly dental visit for hygiene and checkup  Vaccinations: Influenza vaccine: Up to date Pneumococcal vaccine: Up to date Tdap vaccine: Up to date-Due-07/31/2022 Shingles vaccine: Completed vaccines   Covid-19:Up to date  Advanced directives: Copy in chart  Conditions/risks identified: See problem list  Next appointment: Follow up in one year for your annual wellness visit 06/25/2022 @ 3:40   Preventive Care 32 Years and Older, Female Preventive care refers to lifestyle choices and visits with your health care provider that can promote health and wellness. What does preventive care include? A yearly physical exam. This is also called an annual well check. Dental exams once or twice a year. Routine eye exams. Ask your health care provider how often you should have your eyes checked. Personal lifestyle choices, including: Daily care of your teeth and gums. Regular physical activity. Eating a healthy diet. Avoiding tobacco and drug use. Limiting alcohol use. Practicing safe sex. Taking low-dose aspirin every day. Taking vitamin and mineral supplements as recommended by your health care provider. What happens during an annual well check? The services and screenings done by your health care provider during your annual well check will depend on your age, overall health, lifestyle risk factors, and family history of disease. Counseling  Your health care provider may ask you questions about your: Alcohol use. Tobacco  use. Drug use. Emotional well-being. Home and relationship well-being. Sexual activity. Eating habits. History of falls. Memory and ability to understand (cognition). Work and work Statistician. Reproductive health. Screening  You may have the following tests or measurements: Height, weight, and BMI. Blood pressure. Lipid and cholesterol levels. These may be checked every 5 years, or more frequently if you are over 79 years old. Skin check. Lung cancer screening. You may have this screening every year starting at age 27 if you have a 30-pack-year history of smoking and currently smoke or have quit within the past 15 years. Fecal occult blood test (FOBT) of the stool. You may have this test every year starting at age 86. Flexible sigmoidoscopy or colonoscopy. You may have a sigmoidoscopy every 5 years or a colonoscopy every 10 years starting at age 70. Hepatitis C blood test. Hepatitis B blood test. Sexually transmitted disease (STD) testing. Diabetes screening. This is done by checking your blood sugar (glucose) after you have not eaten for a while (fasting). You may have this done every 1-3 years. Bone density scan. This is done to screen for osteoporosis. You may have this done starting at age 34. Mammogram. This may be done every 1-2 years. Talk to your health care provider about how often you should have regular mammograms. Talk with your health care provider about your test results, treatment options, and if necessary, the need for more tests. Vaccines  Your health care provider may recommend certain vaccines, such as: Influenza vaccine. This is recommended every year. Tetanus, diphtheria, and acellular pertussis (Tdap, Td) vaccine. You may need a Td booster every 10 years. Zoster vaccine. You may need this after age 10. Pneumococcal 13-valent conjugate (PCV13) vaccine. One dose is recommended after age 78. Pneumococcal polysaccharide (PPSV23)  vaccine. One dose is recommended  after age 74. Talk to your health care provider about which screenings and vaccines you need and how often you need them. This information is not intended to replace advice given to you by your health care provider. Make sure you discuss any questions you have with your health care provider. Document Released: 09/13/2015 Document Revised: 05/06/2016 Document Reviewed: 06/18/2015 Elsevier Interactive Patient Education  2017 [place] Prevention in the Home Falls can cause injuries. They can happen to people of all ages. There are many things you can do to make your home safe and to help prevent falls. What can I do on the outside of my home? Regularly fix the edges of walkways and driveways and fix any cracks. Remove anything that might make you trip as you walk through a door, such as a raised step or threshold. Trim any bushes or trees on the path to your home. Use bright outdoor lighting. Clear any walking paths of anything that might make someone trip, such as rocks or tools. Regularly check to see if handrails are loose or broken. Make sure that both sides of any steps have handrails. Any raised decks and porches should have guardrails on the edges. Have any leaves, snow, or ice cleared regularly. Use sand or salt on walking paths during winter. Clean up any spills in your garage right away. This includes oil or grease spills. What can I do in the bathroom? Use night lights. Install grab bars by the toilet and in the tub and shower. Do not use towel bars as grab bars. Use non-skid mats or decals in the tub or shower. If you need to sit down in the shower, use a plastic, non-slip stool. Keep the floor dry. Clean up any water that spills on the floor as soon as it happens. Remove soap buildup in the tub or shower regularly. Attach bath mats securely with double-sided non-slip rug tape. Do not have throw rugs and other things on the floor that can make you trip. What can I do  in the bedroom? Use night lights. Make sure that you have a light by your bed that is easy to reach. Do not use any sheets or blankets that are too big for your bed. They should not hang down onto the floor. Have a firm chair that has side arms. You can use this for support while you get dressed. Do not have throw rugs and other things on the floor that can make you trip. What can I do in the kitchen? Clean up any spills right away. Avoid walking on wet floors. Keep items that you use a lot in easy-to-reach places. If you need to reach something above you, use a strong step stool that has a grab bar. Keep electrical cords out of the way. Do not use floor polish or wax that makes floors slippery. If you must use wax, use non-skid floor wax. Do not have throw rugs and other things on the floor that can make you trip. What can I do with my stairs? Do not leave any items on the stairs. Make sure that there are handrails on both sides of the stairs and use them. Fix handrails that are broken or loose. Make sure that handrails are as long as the stairways. Check any carpeting to make sure that it is firmly attached to the stairs. Fix any carpet that is loose or worn. Avoid having throw rugs at the top or bottom  of the stairs. If you do have throw rugs, attach them to the floor with carpet tape. Make sure that you have a light switch at the top of the stairs and the bottom of the stairs. If you do not have them, ask someone to add them for you. What else can I do to help prevent falls? Wear shoes that: Do not have high heels. Have rubber bottoms. Are comfortable and fit you well. Are closed at the toe. Do not wear sandals. If you use a stepladder: Make sure that it is fully opened. Do not climb a closed stepladder. Make sure that both sides of the stepladder are locked into place. Ask someone to hold it for you, if possible. Clearly mark and make sure that you can see: Any grab bars or  handrails. First and last steps. Where the edge of each step is. Use tools that help you move around (mobility aids) if they are needed. These include: Canes. Walkers. Scooters. Crutches. Turn on the lights when you go into a dark area. Replace any light bulbs as soon as they burn out. Set up your furniture so you have a clear path. Avoid moving your furniture around. If any of your floors are uneven, fix them. If there are any pets around you, be aware of where they are. Review your medicines with your doctor. Some medicines can make you feel dizzy. This can increase your chance of falling. Ask your doctor what other things that you can do to help prevent falls. This information is not intended to replace advice given to you by your health care provider. Make sure you discuss any questions you have with your health care provider. Document Released: 06/13/2009 Document Revised: 01/23/2016 Document Reviewed: 09/21/2014 Elsevier Interactive Patient Education  2017 [employer].

## 2021-06-19 NOTE — Progress Notes (Signed)
Subjective:   Tracy Miles is a 87 y.o. female who presents for Medicare Annual (Subsequent) preventive examination.  Review of Systems     Cardiac Risk Factors include: advanced age (>51men, >66 women);dyslipidemia;hypertension;obesity (BMI >30kg/m2)     Objective:    Today's Vitals   06/19/21 1530  BP: (!) 144/76  Pulse: 87  Resp: 16  Temp: 97.8 F (36.6 C)  TempSrc: Temporal  SpO2: 98%  Weight: 282 lb 12.8 oz (128.3 kg)  Height: 5\' 3"  (1.6 m)   Body mass index is 50.1 kg/m.  Advanced Directives 06/19/2021 06/07/2019 04/25/2019 04/24/2019 02/27/2019 09/30/2018 04/21/2018  Does Patient Have a Medical Advance Directive? Yes Yes Yes Yes Yes No Yes  Type of Paramedic of [place];Living will [place];Living will [place];Living will Healthcare Power of [place];Out of facility DNR (pink MOST or yellow form);Living will - Living will;Healthcare Power of Attorney  Does patient want to make changes to medical advance directive? - - No - Patient declined - - - -  Copy of [place] in Chart? Yes - validated most recent copy scanned in chart (See row information) Yes - validated most recent copy scanned in chart (See row information) - Yes - validated most recent copy scanned in chart (See row information) Yes - validated most recent copy scanned in chart (See row information) - Yes  Would patient like information on creating a medical advance directive? - - - - - No - Patient declined -    Current Medications (verified) Outpatient Encounter Medications as of 06/19/2021  Medication Sig   acetaminophen (TYLENOL) 325 MG tablet Take 325 mg by mouth daily as needed (pain.).   apixaban (ELIQUIS) 5 MG TABS tablet Take 1 tablet (5 mg total) by mouth in the morning and at bedtime.   atorvastatin (LIPITOR) 20 MG tablet Take 1 tablet (20 mg total) by mouth daily.   bumetanide (BUMEX)  1 MG tablet Take 1 tablet (1 mg total) by mouth daily.   Calcium Carb-Cholecalciferol (CALCIUM 600 + D PO) Take 1 tablet by mouth 2 (two) times daily.   Carboxymethylcellul-Glycerin (LUBRICATING EYE DROPS OP) Place 1 drop into both eyes in the morning and at bedtime.   clotrimazole-betamethasone (LOTRISONE) cream Apply 1 application topically 2 (two) times daily.   diltiazem (CARDIZEM CD) 120 MG 24 hr capsule Take 1 capsule (120 mg total) by mouth daily.   dofetilide (TIKOSYN) 250 MCG capsule TAKE ONE (1) CAPSULE BY MOUTH 2 TIMES DAILY   lisinopril (ZESTRIL) 10 MG tablet Take 1 tablet (10 mg total) by mouth daily.   loratadine (CLARITIN) 10 MG tablet Take 10 mg by mouth daily.    metoprolol succinate (TOPROL-XL) 25 MG 24 hr tablet Take 1.5 tablets (37.5 mg total) by mouth 2 (two) times daily.   metroNIDAZOLE (METROGEL) 1 % gel Apply topically daily.   Multiple Vitamins-Minerals (ONE-A-DAY WOMENS 50 PLUS PO) Take 1 tablet by mouth daily.   potassium chloride (KLOR-CON) 10 MEQ tablet TAKE 1 TABLET BY MOUTH IN  THE MORNING AND 2 TABLETS  IN THE EVENING   Probiotic CAPS Take 1 capsule by mouth daily.   psyllium (REGULOID) 0.52 g capsule Take 0.52 g by mouth 2 (two) times daily.    diltiazem (CARDIZEM) 30 MG tablet Take 1 Tablet Every 4 Hours As Needed For HR >100 and Top BP >100 (Patient not taking: Reported on 06/19/2021)   No facility-administered encounter medications on  file as of 06/19/2021.    Allergies (verified) Gabapentin, Lactose intolerance (gi), Zebeta [bisoprolol fumarate], Penicillins, and Sulfa antibiotics   History: Past Medical History:  Diagnosis Date   Anemia    Aortic stenosis    Very mild   Benign paroxysmal positional vertigo 12/17/2013   Chicken pox as a child   Colon polyp    Coronary artery disease    DDD (degenerative disc disease) 11/16/2013   Hyperlipidemia    Hypertension    Iron deficiency anemia 11/13/2013   Mumps child and teenager   OA (osteoarthritis)  11/19/2013   S/p L TKR   Obesity    OSA on CPAP    Pancreatitis    post hysterectomy   Persistent atrial fibrillation (HCC)    Personal history of colonic polyps 02/25/2014   Personal history of DVT (deep vein thrombosis) X 2   "left"   Sleep apnea    Spinal stenosis    Past Surgical History:  Procedure Laterality Date   APPENDECTOMY     ATRIAL FIBRILLATION ABLATION N/A 04/25/2019   Procedure: ATRIAL FIBRILLATION ABLATION;  Surgeon: [name], MD;  Location: [facility];  Service: Cardiovascular;  Laterality: N/A;   CARDIOVERSION N/A 08/09/2015   Procedure: CARDIOVERSION;  Surgeon: Jerline Pain, MD;  Location: [place];  Service: Cardiovascular;  Laterality: N/A;   CARDIOVERSION N/A 09/30/2018   Procedure: CARDIOVERSION;  Surgeon: [name], MD;  Location: [facility] ENDOSCOPY;  Service: Cardiovascular;  Laterality: N/A;   CARDIOVERSION N/A 02/27/2019   Procedure: CARDIOVERSION;  Surgeon: Buford Dresser, MD;  Location: [place];  Service: Cardiovascular;  Laterality: N/A;   CARDIOVERSION N/A 06/07/2019   Procedure: CARDIOVERSION;  Surgeon: [name], MD;  Location: [facility] ENDOSCOPY;  Service: Cardiovascular;  Laterality: N/A;   CATARACT EXTRACTION W/ INTRAOCULAR LENS  IMPLANT, BILATERAL Bilateral 2016   CYST REMOVAL HAND Bilateral 1990's   "played to much golf"   DILATION AND CURETTAGE OF UTERUS     implantable loop recorder placement  11/20/2019   Medtronic Reveal Linq model EXH 37 (model number [id number]) implanted in office by Dr Rayann Heman for afib management   LUMBAR LAMINECTOMY  40 yrs ago   "L3-4"   MENISCUS REPAIR Bilateral 2000 and 2010   REPLACEMENT TOTAL KNEE Left 2013   TEE WITHOUT CARDIOVERSION N/A 04/24/2019   Procedure: TRANSESOPHAGEAL ECHOCARDIOGRAM (TEE);  Surgeon: Acie Fredrickson Wonda Cheng, MD;  Location: [facility] ENDOSCOPY;  Service: Cardiovascular;  Laterality: N/A;   TONSILECTOMY, ADENOIDECTOMY, BILATERAL MYRINGOTOMY AND TUBES  child   TOTAL  ABDOMINAL HYSTERECTOMY  1995   had 2 tumors- benign   TUBAL LIGATION  87 years old   Family History  Problem Relation Age of Onset   Heart attack Mother 40   Hyperlipidemia Mother        ?   Dementia Mother    Pernicious anemia Mother    COPD Father        smoker   Cancer Father 39       prostate   Heart disease Brother        quadruple bipass surgery   Hyperlipidemia Brother    Hypertension Brother    Diabetes Maternal Grandmother        type 2   Pernicious anemia Maternal Grandmother    Gout Son    Atrial fibrillation Son    Hyperlipidemia Son    Cancer Maternal Grandfather        liver   Cancer Paternal Grandmother  lung- doesn't think she smokes?   Stroke Paternal Grandfather    Cancer Son 78       non hodgin's lymphoma   Gout Son    Hyperlipidemia Son    Sleep apnea Son    Colon cancer Neg Hx    Pancreatic cancer Neg Hx    Stomach cancer Neg Hx    Throat cancer Neg Hx    Liver disease Neg Hx    Social History   Socioeconomic History   Marital status: Widowed    Spouse name: Not on file   Number of children: 3   Years of education: Not on file   Highest education level: Not on file  Occupational History   Occupation: retired Pharmacist, hospital  Tobacco Use   Smoking status: Never   Smokeless tobacco: Never   Tobacco comments:    never used tobacco  Substance and Sexual Activity   Alcohol use: No   Drug use: No   Sexual activity: Never    Comment: lives at [place], low sodium diet  Other Topics Concern   Not on file  Social History Narrative   Not on file   Social Determinants of Health   Financial Resource Strain: Low Risk    Difficulty of Paying Living Expenses: Not hard at all  Food Insecurity: No Food Insecurity   Worried About Charity fundraiser in the Last Year: Never true   Arboriculturist in the Last Year: Never true  Transportation Needs: No Transportation Needs   Lack of Transportation (Medical): No   Lack of Transportation  (Non-Medical): No  Physical Activity: Sufficiently Active   Days of Exercise per Week: 3 days   Minutes of Exercise per Session: 60 min  Stress: No Stress Concern Present   Feeling of Stress : Not at all  Social Connections: Moderately Integrated   Frequency of Communication with Friends and Family: More than three times a week   Frequency of Social Gatherings with Friends and Family: More than three times a week   Attends Religious Services: More than 4 times per year   Active Member of [employer] or Organizations: Yes   Attends Archivist Meetings: 1 to 4 times per year   Marital Status: Widowed    Tobacco Counseling Counseling given: Not Answered Tobacco comments: never used tobacco   Clinical Intake:  Pre-visit preparation completed: Yes  Pain : No/denies pain     BMI - recorded: 50.1 Nutritional Status: BMI > 30  Obese Nutritional Risks: None Diabetes: No  How often do you need to have someone help you when you read instructions, pamphlets, or other written materials from your doctor or pharmacy?: 1 - Never  Diabetic?No  Interpreter Needed?: No  Information entered by :: Caroleen Hamman LPN   Activities of Daily Living In your present state of health, do you have any difficulty performing the following activities: 06/19/2021 05/19/2021  Hearing? N Y  Vision? N N  Difficulty concentrating or making decisions? N N  Walking or climbing stairs? N N  Dressing or bathing? N N  Doing errands, shopping? N N  Preparing Food and eating ? N -  Using the Toilet? N -  In the past six months, have you accidently leaked urine? N -  Do you have problems with loss of bowel control? N -  Managing your Medications? N -  Managing your Finances? N -  Housekeeping or managing your Housekeeping? N -  Some recent data  might be hidden    Patient Care Team: [name], MD as PCP - General (Family Medicine) Clayborne Artist, NP as Nurse Practitioner (Cardiology) Rigoberto Noel, MD as Consulting Physician (Pulmonary Disease) Melrose Nakayama, MD as Consulting Physician (Orthopedic Surgery) Edythe Clarity, [facility] (Pharmacist)  Indicate any recent Medical Services you may have received from other than [facility] providers in the past year (date may be approximate).     Assessment:   This is a routine wellness examination for Peck.  Hearing/Vision screen Hearing Screening - Comments:: C/o mild hearing loss Vision Screening - Comments:: Reading glasses Last eye exam- 05/2020-[facility]  Dietary issues and exercise activities discussed: Current Exercise Habits: Home exercise routine, Type of exercise: Other - see comments (walks in the pool), Time (Minutes): 60, Frequency (Times/Week): 3, Weekly Exercise (Minutes/Week): 180, Intensity: Mild, Exercise limited by: None identified   Goals Addressed             This Visit's Progress    Maintain current health and independence   On track      Depression Screen PHQ 2/9 Scores 06/19/2021 05/19/2021 07/22/2020 11/07/2019 04/21/2018 09/29/2016 09/06/2015  PHQ - 2 Score 0 0 0 0 0 0 0    Fall Risk Fall Risk  06/19/2021 05/19/2021 11/14/2020 04/21/2018 04/01/2018  Falls in the past year? 0 0 0 No No  Comment - - - - Emmi Telephone Survey: data to providers prior to load  Number falls in past yr: 0 0 0 - -  Injury with Fall? 0 0 0 - -  Follow up Falls prevention discussed - - - -    FALL RISK PREVENTION PERTAINING TO THE HOME:  Any stairs in or around the home? No  Home free of loose throw rugs in walkways, pet beds, electrical cords, etc? Yes  Adequate lighting in your home to reduce risk of falls? Yes   ASSISTIVE DEVICES UTILIZED TO PREVENT FALLS:  Life alert? No  Use of a cane, walker or w/c? No  Grab bars in the bathroom? Yes  Shower chair or bench in shower? No  Elevated toilet seat or a handicapped toilet? No   TIMED UP AND GO:  Was the test performed? Yes .  Length of time to ambulate 10  feet: 11 sec.   Gait steady and fast without use of assistive device  Cognitive Function:Normal cognitive status assessed by direct observation by this Nurse Health Advisor. No abnormalities found.   MMSE - Mini Mental State Exam 04/21/2018 09/29/2016  Orientation to time 5 5  Orientation to Place 5 5  Registration 3 3  Attention/ Calculation 5 5  Recall 2 3  Language- name 2 objects 2 2  Language- repeat 1 1  Language- follow 3 step command 3 3  Language- read & follow direction 1 1  Write a sentence 1 1  Copy design 1 1  Total score 29 30        Immunizations Immunization History  Administered Date(s) Administered   Fluad Quad(high Dose 65+) 06/16/2019, 05/19/2021   Influenza, High Dose Seasonal PF 05/11/2017, 05/24/2018   Influenza,inj,Quad PF,6+ Mos 04/30/2016   Influenza-Unspecified 05/31/2013, 05/01/2014, 06/11/2015   Moderna Sars-Covid-2 Vaccination 09/13/2019, 10/10/2019, 07/03/2020, 12/18/2020   Pfizer Covid-19 Vaccine Bivalent Booster 70yrs & up 06/12/2021   Pneumococcal Conjugate-13 04/02/2014   Pneumococcal Polysaccharide-23 08/31/1998, 09/06/2015   Tdap 07/31/2012   Zoster Recombinat (Shingrix) 04/19/2018, 06/27/2018    TDAP status: Up to date  Flu Vaccine status:  Up to date  Pneumococcal vaccine status: Up to date  Covid-19 vaccine status: Completed vaccines  Qualifies for Shingles Vaccine? No   Zostavax completed No   Shingrix Completed?: Yes  Screening Tests Health Maintenance  Topic Date Due   TETANUS/TDAP  07/31/2022   Pneumonia Vaccine 16+ Years old  Completed   INFLUENZA VACCINE  Completed   DEXA SCAN  Completed   COVID-19 Vaccine  Completed   Zoster Vaccines- Shingrix  Completed   HPV VACCINES  Aged Out   COLONOSCOPY (Pts 45-59yrs Insurance coverage will need to be confirmed)  Discontinued    Health Maintenance  There are no preventive care reminders to display for this patient.  Colorectal cancer screening: No longer required.    Mammogram status: Completed bilateral 11/11/2020. Repeat every year  Bone Density status: Declined  Lung Cancer Screening: (Low Dose CT Chest recommended if Age 3-80 years, 30 pack-year currently smoking OR have quit w/in 15years.) does not qualify.     Additional Screening:  Hepatitis C Screening: does not qualify  Vision Screening: Recommended annual ophthalmology exams for early detection of glaucoma and other disorders of the eye. Is the patient up to date with their annual eye exam?  Yes  Who is the provider or what is the name of the office in which the patient attends annual eye exams? [place] Screening: Recommended annual dental exams for proper oral hygiene  Community Resource Referral / Chronic Care Management: CRR required this visit?  No   CCM required this visit?  No      Plan:     I have personally reviewed and noted the following in the patient's chart:   Medical and social history Use of alcohol, tobacco or illicit drugs  Current medications and supplements including opioid prescriptions.  Functional ability and status Nutritional status Physical activity Advanced directives List of other physicians Hospitalizations, surgeries, and ER visits in previous 12 months Vitals Screenings to include cognitive, depression, and falls Referrals and appointments  In addition, I have reviewed and discussed with patient certain preventive protocols, quality metrics, and best practice recommendations. A written personalized care plan for preventive services as well as general preventive health recommendations were provided to patient.   Patient to access avs on mychart.  Marta Antu, LPN   [id number]  Nurse Health Advisor  Nurse Notes: None

## 2021-06-25 NOTE — Progress Notes (Signed)
Carelink Summary Report / Loop Recorder 

## 2021-07-02 ENCOUNTER — Telehealth: Payer: Self-pay

## 2021-07-02 NOTE — Telephone Encounter (Signed)
LINQ alert received.  Ongoing  AF episode from 10/31 burden now 99.9.  S/P PVI Last ov 10/19, prn Diltiazem was added for HR>100  Current meds include: Tikosyn 250 mcg BID, metoprolol 37.5mg   BID, Diltiazem 120mg  daily + PRN 30mg  Q 4 hours as needed for HR >100 and SBP >100  Spoke with pt, she is aware of being in AF, she reports feeling dizzy and fatigued.  She has felt the need to stay close to home due to how she is feeling.  She confirmed copliance with meds as ordered.  She indicates she has taken ~3 doses of PRN diltiazem each day including one dose already this morning.  She does not feel it is making any difference.  Her BP has been stable, last check yesterday at 1130am it was 122/99.    Advised pt I would forward info to AF clinic.

## 2021-07-04 NOTE — Telephone Encounter (Signed)
Pt appears back in NSR on linq transmission 11/4. Will continue to monitor.

## 2021-07-16 ENCOUNTER — Other Ambulatory Visit (HOSPITAL_COMMUNITY): Payer: Self-pay | Admitting: Nurse Practitioner

## 2021-07-16 LAB — CUP PACEART REMOTE DEVICE CHECK
Date Time Interrogation Session: 20221114230353
Implantable Pulse Generator Implant Date: 20210322

## 2021-07-21 ENCOUNTER — Ambulatory Visit (INDEPENDENT_AMBULATORY_CARE_PROVIDER_SITE_OTHER): Payer: Medicare Other

## 2021-07-21 DIAGNOSIS — I480 Paroxysmal atrial fibrillation: Secondary | ICD-10-CM | POA: Diagnosis not present

## 2021-07-30 NOTE — Progress Notes (Signed)
Carelink Summary Report / Loop Recorder 

## 2021-08-01 ENCOUNTER — Other Ambulatory Visit (HOSPITAL_COMMUNITY): Payer: Self-pay

## 2021-08-01 MED ORDER — POTASSIUM CHLORIDE ER 10 MEQ PO TBCR: EXTENDED_RELEASE_TABLET | ORAL | 3 refills | Status: DC

## 2021-08-05 ENCOUNTER — Telehealth: Payer: Self-pay | Admitting: Internal Medicine

## 2021-08-05 NOTE — Telephone Encounter (Signed)
   [facility] Pre-operative Risk Assessment    Patient Name: Tracy Miles  DOB: [birth year] MRN: [id number]  HEARTCARE STAFF:  - IMPORTANT!!!!!! Under Visit Info/Reason for Call, type in Other and utilize the format Clearance MM/DD/YY or Clearance TBD. Do not use dashes or single digits. - Please review there is not already an duplicate clearance open for this procedure. - If request is for dental extraction, please clarify the # of teeth to be extracted. - If the patient is currently at the dentist's office, call Pre-Op Callback Staff (MA/nurse) to input urgent request.  - If the patient is not currently in the dentist office, please route to the Pre-Op pool.  Request for surgical clearance:  What type of surgery is being performed? Front Lateral #7  When is this surgery scheduled? TBD  What type of clearance is required (medical clearance vs. Pharmacy clearance to hold med vs. Both)? Both   Are there any medications that need to be held prior to surgery and how long? [facility] name and name of physician performing surgery? Toniann Ket   What is the office phone number? [phone]   7.   What is the office fax number? [phone]  8.   Anesthesia type (None, local, MAC, general) ? Local    Tracy Miles 08/05/2021, 4:03 PM  _________________________________________________________________   (provider comments below)

## 2021-08-06 NOTE — Telephone Encounter (Signed)
Placed call to [facility], and Maudie Mercury confirms it is 1 extraction

## 2021-08-07 NOTE — Telephone Encounter (Signed)
   Patient Name: Tracy Miles  DOB: 21-Oct-1933 MRN: [id number]  Primary Cardiologist: Dr. Christiana [facility]  Chart reviewed as part of pre-operative protocol coverage.   Simple dental extractions (I.e. 1-2 teeth at a time) are considered low risk procedures per guidelines and generally do not require any specific cardiac clearance. It is also generally accepted that for simple extractions and dental cleanings, there is no need to interrupt blood thinner therapy.  SBE prophylaxis is not required for the patient from a cardiac standpoint based on available records.  I will route this recommendation to the requesting party via Epic fax function and remove from pre-op pool.  Please call with questions.  Charlie Pitter, PA-C 08/07/2021, 10:26 AM

## 2021-08-21 LAB — CUP PACEART REMOTE DEVICE CHECK
Date Time Interrogation Session: 20221217230706
Implantable Pulse Generator Implant Date: 20210322

## 2021-08-26 ENCOUNTER — Ambulatory Visit (INDEPENDENT_AMBULATORY_CARE_PROVIDER_SITE_OTHER): Payer: Medicare Other

## 2021-08-26 DIAGNOSIS — I480 Paroxysmal atrial fibrillation: Secondary | ICD-10-CM

## 2021-09-04 NOTE — Progress Notes (Signed)
Carelink Summary Report / Loop Recorder 

## 2021-09-22 ENCOUNTER — Ambulatory Visit (INDEPENDENT_AMBULATORY_CARE_PROVIDER_SITE_OTHER): Payer: Medicare Other | Admitting: Family Medicine

## 2021-09-22 VITALS — BP 132/74 | HR 74 | Temp 97.7°F | Resp 12 | Ht 63.0 in | Wt 286.0 lb

## 2021-09-22 DIAGNOSIS — R739 Hyperglycemia, unspecified: Secondary | ICD-10-CM

## 2021-09-22 DIAGNOSIS — I4819 Other persistent atrial fibrillation: Secondary | ICD-10-CM

## 2021-09-22 DIAGNOSIS — E782 Mixed hyperlipidemia: Secondary | ICD-10-CM

## 2021-09-22 DIAGNOSIS — Z1239 Encounter for other screening for malignant neoplasm of breast: Secondary | ICD-10-CM | POA: Diagnosis not present

## 2021-09-22 DIAGNOSIS — M1990 Unspecified osteoarthritis, unspecified site: Secondary | ICD-10-CM | POA: Diagnosis not present

## 2021-09-22 DIAGNOSIS — Z1231 Encounter for screening mammogram for malignant neoplasm of breast: Secondary | ICD-10-CM | POA: Diagnosis not present

## 2021-09-22 DIAGNOSIS — I10 Essential (primary) hypertension: Secondary | ICD-10-CM

## 2021-09-22 DIAGNOSIS — E6609 Other obesity due to excess calories: Secondary | ICD-10-CM | POA: Diagnosis not present

## 2021-09-22 LAB — COMPREHENSIVE METABOLIC PANEL
ALT: 11 U/L (ref 0–35)
AST: 17 U/L (ref 0–37)
Albumin: 4.3 g/dL (ref 3.5–5.2)
Alkaline Phosphatase: 80 U/L (ref 39–117)
BUN: 17 mg/dL (ref 6–23)
CO2: 31 mEq/L (ref 19–32)
Calcium: 9.6 mg/dL (ref 8.4–10.5)
Chloride: 100 mEq/L (ref 96–112)
Creatinine, Ser: 0.95 mg/dL (ref 0.40–1.20)
GFR: 54.65 mL/min — ABNORMAL LOW (ref >60.00)
Glucose, Bld: 94 mg/dL (ref 70–99)
Potassium: 3.9 mEq/L (ref 3.5–5.1)
Sodium: 141 mEq/L (ref 135–145)
Total Bilirubin: 0.7 mg/dL (ref 0.2–1.2)
Total Protein: 6.7 g/dL (ref 6.0–8.3)

## 2021-09-22 LAB — CBC
HCT: 38.6 % (ref 36.0–46.0)
Hemoglobin: 12.8 g/dL (ref 12.0–15.0)
MCHC: 33.2 g/dL (ref 30.0–36.0)
MCV: 95.1 fl (ref 78.0–100.0)
Platelets: 242 10*3/uL (ref 150.0–400.0)
RBC: 4.06 Mil/uL (ref 3.87–5.11)
RDW: 12.8 % (ref 11.5–15.5)
WBC: 5.9 10*3/uL (ref 4.0–10.5)

## 2021-09-22 LAB — LIPID PANEL
Cholesterol: 153 mg/dL (ref 0–200)
HDL: 41.8 mg/dL (ref >39.00)
LDL Cholesterol: 78 mg/dL (ref 0–99)
NonHDL: 110.98
Total CHOL/HDL Ratio: 4
Triglycerides: 165 mg/dL — ABNORMAL HIGH (ref 0.0–149.0)
VLDL: 33 mg/dL (ref 0.0–40.0)

## 2021-09-22 LAB — TSH: TSH: 2.67 u[IU]/mL (ref 0.35–5.50)

## 2021-09-22 LAB — HEMOGLOBIN A1C: Hgb A1c MFr Bld: 5.8 % (ref 4.6–6.5)

## 2021-09-22 MED ORDER — LISINOPRIL 10 MG PO TABS: 10.0000 mg | ORAL_TABLET | Freq: Every day | ORAL | 1 refills | Status: DC

## 2021-09-22 NOTE — Assessment & Plan Note (Signed)
Is having more persistent symptoms, follows with cardiology, her cardiologist is leaving clinical practice so she is moving to [place]. Last episode was 3-4 weeks ago. When she has an episode she takes extra Cardizem every 4 hours. No obvious triggers.

## 2021-09-22 NOTE — Assessment & Plan Note (Signed)
Tolerating statin, encouraged heart healthy diet, avoid trans fats, minimize simple carbs and saturated fats. Increase exercise as tolerated. Dr Atorvastatin

## 2021-09-22 NOTE — Assessment & Plan Note (Signed)
Encouraged DASH or MIND diet, decrease po intake and increase exercise as tolerated. Needs 7-8 hours of sleep nightly. Avoid trans fats, eat small, frequent meals every 4-5 hours with lean proteins, complex carbs and healthy fats. Minimize simple carbs, high fat foods and processed foods. Consider Wegovy or Saxenda °

## 2021-09-22 NOTE — Patient Instructions (Addendum)
Molnupiravir is the anti viral we give most of the time Weekly Wegovy or Saxenda (daily) shots for weight loss    Omron blood pressure cuffs. Upper arm automated.    Hypertension, Adult High blood pressure (hypertension) is when the force of blood pumping through the arteries is too strong. The arteries are the blood vessels that carry blood from the heart throughout the body. Hypertension forces the heart to work harder to pump blood and may cause arteries to become narrow or stiff. Untreated or uncontrolled hypertension can cause a heart attack, heart failure, a stroke, kidney disease, and other problems. A blood pressure reading consists of a higher number over a lower number. Ideally, your blood pressure should be below 120/80. The first ("top") number is called the systolic pressure. It is a measure of the pressure in your arteries as your heart beats. The second ("bottom") number is called the diastolic pressure. It is a measure of the pressure in your arteries as the heart relaxes. What are the causes? The exact cause of this condition is not known. There are some conditions that result in or are related to high blood pressure. What increases the risk? Some risk factors for high blood pressure are under your control. The following factors may make you more likely to develop this condition: Smoking. Having type 2 diabetes mellitus, high cholesterol, or both. Not getting enough exercise or physical activity. Being overweight. Having too much fat, sugar, calories, or salt (sodium) in your diet. Drinking too much alcohol. Some risk factors for high blood pressure may be difficult or impossible to change. Some of these factors include: Having chronic kidney disease. Having a family history of high blood pressure. Age. Risk increases with age. Race. You may be at higher risk if you are African American. Gender. Men are at higher risk than women before age 76. After age 55, women are at  higher risk than men. Having obstructive sleep apnea. Stress. What are the signs or symptoms? High blood pressure may not cause symptoms. Very high blood pressure (hypertensive crisis) may cause: Headache. Anxiety. Shortness of breath. Nosebleed. Nausea and vomiting. Vision changes. Severe chest pain. Seizures. How is this diagnosed? This condition is diagnosed by measuring your blood pressure while you are seated, with your arm resting on a flat surface, your legs uncrossed, and your feet flat on the floor. The cuff of the blood pressure monitor will be placed directly against the skin of your upper arm at the level of your heart. It should be measured at least twice using the same arm. Certain conditions can cause a difference in blood pressure between your right and left arms. Certain factors can cause blood pressure readings to be lower or higher than normal for a short period of time: When your blood pressure is higher when you are in a health care provider's office than when you are at home, this is called white coat hypertension. Most people with this condition do not need medicines. When your blood pressure is higher at home than when you are in a health care provider's office, this is called masked hypertension. Most people with this condition may need medicines to control blood pressure. If you have a high blood pressure reading during one visit or you have normal blood pressure with other risk factors, you may be asked to: Return on a different day to have your blood pressure checked again. Monitor your blood pressure at home for 1 week or longer. If you are diagnosed  with hypertension, you may have other blood or imaging tests to help your health care provider understand your overall risk for other conditions. How is this treated? This condition is treated by making healthy lifestyle changes, such as eating healthy foods, exercising more, and reducing your alcohol intake. Your  health care provider may prescribe medicine if lifestyle changes are not enough to get your blood pressure under control, and if: Your systolic blood pressure is above 130. Your diastolic blood pressure is above 80. Your personal target blood pressure may vary depending on your medical conditions, your age, and other factors. Follow these instructions at home: Eating and drinking  Eat a diet that is high in fiber and potassium, and low in sodium, added sugar, and fat. An example eating plan is called the DASH (Dietary Approaches to Stop Hypertension) diet. To eat this way: Eat plenty of fresh fruits and vegetables. Try to fill one half of your plate at each meal with fruits and vegetables. Eat whole grains, such as whole-wheat pasta, brown rice, or whole-grain bread. Fill about one fourth of your plate with whole grains. Eat or drink low-fat dairy products, such as skim milk or low-fat yogurt. Avoid fatty cuts of meat, processed or cured meats, and poultry with skin. Fill about one fourth of your plate with lean proteins, such as fish, chicken without skin, beans, eggs, or tofu. Avoid pre-made and processed foods. These tend to be higher in sodium, added sugar, and fat. Reduce your daily sodium intake. Most people with hypertension should eat less than 1,500 mg of sodium a day. Do not drink alcohol if: Your health care provider tells you not to drink. You are pregnant, may be pregnant, or are planning to become pregnant. If you drink alcohol: Limit how much you use to: 0-1 drink a day for women. 0-2 drinks a day for men. Be aware of how much alcohol is in your drink. In the [place] one drink equals one 12 oz bottle of beer (355 mL), one 5 oz glass of wine (148 mL), or one 1 oz glass of hard liquor (44 mL). Lifestyle  Work with your health care provider to maintain a healthy body weight or to lose weight. Ask what an ideal weight is for you. Get at least 30 minutes of exercise most days of the  week. Activities may include walking, swimming, or biking. Include exercise to strengthen your muscles (resistance exercise), such as Pilates or lifting weights, as part of your weekly exercise routine. Try to do these types of exercises for 30 minutes at least 3 days a week. Do not use any products that contain nicotine or tobacco, such as cigarettes, e-cigarettes, and chewing tobacco. If you need help quitting, ask your health care provider. Monitor your blood pressure at home as told by your health care provider. Keep all follow-up visits as told by your health care provider. This is important. Medicines Take over-the-counter and prescription medicines only as told by your health care provider. Follow directions carefully. Blood pressure medicines must be taken as prescribed. Do not skip doses of blood pressure medicine. Doing this puts you at risk for problems and can make the medicine less effective. Ask your health care provider about side effects or reactions to medicines that you should watch for. Contact a health care provider if you: Think you are having a reaction to a medicine you are taking. Have headaches that keep coming back (recurring). Feel dizzy. Have swelling in your ankles. Have trouble with  your vision. Get help right away if you: Develop a severe headache or confusion. Have unusual weakness or numbness. Feel faint. Have severe pain in your chest or abdomen. Vomit repeatedly. Have trouble breathing. Summary Hypertension is when the force of blood pumping through your arteries is too strong. If this condition is not controlled, it may put you at risk for serious complications. Your personal target blood pressure may vary depending on your medical conditions, your age, and other factors. For most people, a normal blood pressure is less than 120/80. Hypertension is treated with lifestyle changes, medicines, or a combination of both. Lifestyle changes include losing weight,  eating a healthy, low-sodium diet, exercising more, and limiting alcohol. This information is not intended to replace advice given to you by your health care provider. Make sure you discuss any questions you have with your health care provider. Document Revised: 04/27/2018 Document Reviewed: 04/27/2018 Elsevier Patient Education  [place].

## 2021-09-22 NOTE — Progress Notes (Signed)
Subjective:   By signing my name below, I, Zite Okoli, attest that this documentation has been prepared under the direction and in the presence of [name], MD. 09/22/2021      Patient ID: Tracy Miles, female    DOB: Dec 28, 1933, 88 y.o.   MRN: [id number]  Chief Complaint  Patient presents with   Follow-up    HPI Patient is in today for an office visit and 4 month f/u.  She reports she is doing well and is still keeping to herself.  She mentions that she has had a lot of dental work recently. Was told by her dentist to discuss her usage of 5 mg Eliquis because she did not bleed after her her tooth was pulled.  She has had no recent episode of A-fib.  She is requesting for a refill on 10 mg lisinopril.   Past Medical History:  Diagnosis Date   Anemia    Aortic stenosis    Very mild   Benign paroxysmal positional vertigo 12/17/2013   Chicken pox as a child   Colon polyp    Coronary artery disease    DDD (degenerative disc disease) 11/16/2013   Hyperlipidemia    Hypertension    Iron deficiency anemia 11/13/2013   Mumps child and teenager   OA (osteoarthritis) 11/19/2013   S/p L TKR   Obesity    OSA on CPAP    Pancreatitis    post hysterectomy   Persistent atrial fibrillation (HCC)    Personal history of colonic polyps 02/25/2014   Personal history of DVT (deep vein thrombosis) X 2   "left"   Sleep apnea    Spinal stenosis     Past Surgical History:  Procedure Laterality Date   APPENDECTOMY     ATRIAL FIBRILLATION ABLATION N/A 04/25/2019   Procedure: ATRIAL FIBRILLATION ABLATION;  Surgeon: [name], MD;  Location: [place] [facility];  Service: Cardiovascular;  Laterality: N/A;   CARDIOVERSION N/A 08/09/2015   Procedure: CARDIOVERSION;  Surgeon: Jerline Pain, MD;  Location: [place];  Service: Cardiovascular;  Laterality: N/A;   CARDIOVERSION N/A 09/30/2018   Procedure: CARDIOVERSION;  Surgeon: [name], MD;  Location: [facility] ENDOSCOPY;   Service: Cardiovascular;  Laterality: N/A;   CARDIOVERSION N/A 02/27/2019   Procedure: CARDIOVERSION;  Surgeon: Buford Dresser, MD;  Location: [place];  Service: Cardiovascular;  Laterality: N/A;   CARDIOVERSION N/A 06/07/2019   Procedure: CARDIOVERSION;  Surgeon: [name], MD;  Location: [facility] ENDOSCOPY;  Service: Cardiovascular;  Laterality: N/A;   CATARACT EXTRACTION W/ INTRAOCULAR LENS  IMPLANT, BILATERAL Bilateral 2016   CYST REMOVAL HAND Bilateral 1990's   "played to much golf"   DILATION AND CURETTAGE OF UTERUS     implantable loop recorder placement  11/20/2019   Medtronic Reveal Linq model EVO 35 (model number [id number]) implanted in office by Dr Rayann Heman for afib management   LUMBAR LAMINECTOMY  40 yrs ago   "L3-4"   MENISCUS REPAIR Bilateral 2000 and 2010   REPLACEMENT TOTAL KNEE Left 2013   TEE WITHOUT CARDIOVERSION N/A 04/24/2019   Procedure: TRANSESOPHAGEAL ECHOCARDIOGRAM (TEE);  Surgeon: Acie Fredrickson Wonda Cheng, MD;  Location: [facility] ENDOSCOPY;  Service: Cardiovascular;  Laterality: N/A;   TONSILECTOMY, ADENOIDECTOMY, BILATERAL MYRINGOTOMY AND TUBES  child   TOTAL ABDOMINAL HYSTERECTOMY  1995   had 2 tumors- benign   TUBAL LIGATION  88 years old    Family History  Problem Relation Age of Onset   Heart attack Mother 68  Hyperlipidemia Mother        ?   Dementia Mother    Pernicious anemia Mother    COPD Father        smoker   Cancer Father 61       prostate   Heart disease Brother        quadruple bipass surgery   Hyperlipidemia Brother    Hypertension Brother    Diabetes Maternal Grandmother        type 2   Pernicious anemia Maternal Grandmother    Gout Son    Atrial fibrillation Son    Hyperlipidemia Son    Cancer Maternal Grandfather        liver   Cancer Paternal Grandmother        lung- doesn't think she smokes?   Stroke Paternal Grandfather    Cancer Son 31       non hodgin's lymphoma   Gout Son    Hyperlipidemia Son    Sleep apnea Son     Colon cancer Neg Hx    Pancreatic cancer Neg Hx    Stomach cancer Neg Hx    Throat cancer Neg Hx    Liver disease Neg Hx     Social History   Socioeconomic History   Marital status: Widowed    Spouse name: Not on file   Number of children: 3   Years of education: Not on file   Highest education level: Not on file  Occupational History   Occupation: retired Pharmacist, hospital  Tobacco Use   Smoking status: Never   Smokeless tobacco: Never   Tobacco comments:    never used tobacco  Substance and Sexual Activity   Alcohol use: No   Drug use: No   Sexual activity: Never    Comment: lives [place], low sodium diet  Other Topics Concern   Not on file  Social History Narrative   Not on file   Social Determinants of Health   Financial Resource Strain: Low Risk    Difficulty of Paying Living Expenses: Not hard at all  Food Insecurity: No Food Insecurity   Worried About Charity fundraiser in the Last Year: Never true   Arboriculturist in the Last Year: Never true  Transportation Needs: No Transportation Needs   Lack of Transportation (Medical): No   Lack of Transportation (Non-Medical): No  Physical Activity: Sufficiently Active   Days of Exercise per Week: 3 days   Minutes of Exercise per Session: 60 min  Stress: No Stress Concern Present   Feeling of Stress : Not at all  Social Connections: Moderately Integrated   Frequency of Communication with Friends and Family: More than three times a week   Frequency of Social Gatherings with Friends and Family: More than three times a week   Attends Religious Services: More than 4 times per year   Active Member of [employer] or Organizations: Yes   Attends Archivist Meetings: 1 to 4 times per year   Marital Status: Widowed  Human resources officer Violence: Not At Risk   Fear of Current or Ex-Partner: No   Emotionally Abused: No   Physically Abused: No   Sexually Abused: No    Outpatient Medications Prior to Visit   Medication Sig Dispense Refill   acetaminophen (TYLENOL) 325 MG tablet Take 325 mg by mouth daily as needed (pain.).     apixaban (ELIQUIS) 5 MG TABS tablet Take 1 tablet (5 mg total) by mouth  in the morning and at bedtime. 180 tablet 1   atorvastatin (LIPITOR) 20 MG tablet Take 1 tablet (20 mg total) by mouth daily. 90 tablet 3   bumetanide (BUMEX) 1 MG tablet Take 1 tablet (1 mg total) by mouth daily. 90 tablet 1   Calcium Carb-Cholecalciferol (CALCIUM 600 + D PO) Take 1 tablet by mouth 2 (two) times daily.     Carboxymethylcellul-Glycerin (LUBRICATING EYE DROPS OP) Place 1 drop into both eyes in the morning and at bedtime.     diltiazem (CARDIZEM CD) 120 MG 24 hr capsule Take 1 capsule (120 mg total) by mouth daily. 90 capsule 3   diltiazem (CARDIZEM) 30 MG tablet Take 1 Tablet Every 4 Hours As Needed For HR >100 and Top BP >100 45 tablet 1   dofetilide (TIKOSYN) 250 MCG capsule TAKE ONE (1) CAPSULE BY MOUTH 2 TIMES DAILY 60 capsule 11   loratadine (CLARITIN) 10 MG tablet Take 10 mg by mouth daily.      metoprolol succinate (TOPROL-XL) 25 MG 24 hr tablet Take 1.5 tablets (37.5 mg total) by mouth 2 (two) times daily. 270 tablet 2   Multiple Vitamins-Minerals (ONE-A-DAY WOMENS 50 PLUS PO) Take 1 tablet by mouth daily.     potassium chloride (KLOR-CON) 10 MEQ tablet TAKE 1 TABLET BY MOUTH IN  THE MORNING AND 2 TABLETS  BY MOUTH IN THE EVENING 270 tablet 3   Probiotic CAPS Take 1 capsule by mouth daily.     psyllium (REGULOID) 0.52 g capsule Take 0.52 g by mouth 2 (two) times daily.      lisinopril (ZESTRIL) 10 MG tablet Take 1 tablet (10 mg total) by mouth daily. 90 tablet 1   clotrimazole-betamethasone (LOTRISONE) cream Apply 1 application topically 2 (two) times daily. 45 g 0   metroNIDAZOLE (METROGEL) 1 % gel Apply topically daily. 60 g 1   No facility-administered medications prior to visit.    Allergies  Allergen Reactions   Gabapentin Swelling   Lactose Intolerance (Gi) Other (See  Comments)    Bothers her stomach   Zebeta [Bisoprolol Fumarate] Nausea Only   Penicillins Hives and Rash    Did it involve swelling of the face/tongue/throat, SOB, or low BP? No Did it involve sudden or severe rash/hives, skin peeling, or any reaction on the inside of your mouth or nose? Yes Did you need to seek medical attention at a hospital or doctor's office? No When did it last happen?       25+ years If all above answers are NO, may proceed with cephalosporin use.    Sulfa Antibiotics Rash    Review of Systems  Constitutional:  Negative for fever and malaise/fatigue.  HENT:  Negative for congestion.   Eyes:  Negative for redness.  Respiratory:  Negative for shortness of breath.   Cardiovascular:  Negative for chest pain, palpitations and leg swelling.  Gastrointestinal:  Negative for abdominal pain, blood in stool and nausea.  Genitourinary:  Negative for dysuria and frequency.  Musculoskeletal:  Negative for falls.  Skin:  Negative for rash.  Neurological:  Negative for dizziness, loss of consciousness and headaches.  Endo/Heme/Allergies:  Negative for polydipsia.  Psychiatric/Behavioral:  Negative for depression. The patient is not nervous/anxious.       Objective:    Physical Exam Constitutional:      General: She is not in acute distress.    Appearance: She is well-developed.  HENT:     Head: Normocephalic and atraumatic.  Eyes:  Conjunctiva/sclera: Conjunctivae normal.  Neck:     Thyroid: No thyromegaly.  Cardiovascular:     Rate and Rhythm: Normal rate and regular rhythm.     Heart sounds: Normal heart sounds. No murmur heard. Pulmonary:     Effort: Pulmonary effort is normal. No respiratory distress.     Breath sounds: Normal breath sounds.  Abdominal:     General: Bowel sounds are normal. There is no distension.     Palpations: Abdomen is soft. There is no mass.     Tenderness: There is no abdominal tenderness.  Musculoskeletal:     Cervical back:  Neck supple.  Lymphadenopathy:     Cervical: No cervical adenopathy.  Skin:    General: Skin is warm and dry.  Neurological:     Mental Status: She is alert and oriented to person, place, and time.  Psychiatric:        Behavior: Behavior normal.    BP 132/74    Pulse 74    Temp 97.7 F (36.5 C) (Oral)    Resp 12    Ht 5\' 3"  (1.6 m)    Wt 286 lb (129.7 kg)    SpO2 98%    BMI 50.66 kg/m  Wt Readings from Last 3 Encounters:  09/22/21 286 lb (129.7 kg)  06/19/21 282 lb 12.8 oz (128.3 kg)  06/18/21 283 lb 9.6 oz (128.6 kg)    Diabetic Foot Exam - Simple   No data filed    Lab Results  Component Value Date   WBC 7.1 11/14/2020   HGB 12.9 11/14/2020   HCT 38.1 11/14/2020   PLT 231.0 11/14/2020   GLUCOSE 102 (H) 06/18/2021   CHOL 154 11/14/2020   TRIG 159.0 (H) 11/14/2020   HDL 44.50 11/14/2020   LDLCALC 78 11/14/2020   ALT 9 11/14/2020   AST 14 11/14/2020   NA 137 06/18/2021   K 4.1 06/18/2021   CL 100 06/18/2021   CREATININE 0.93 06/18/2021   BUN 20 06/18/2021   CO2 30 06/18/2021   TSH 3.27 11/14/2020   INR 1.55 (H) 08/06/2015   HGBA1C 6.0 05/19/2021    Lab Results  Component Value Date   TSH 3.27 11/14/2020   Lab Results  Component Value Date   WBC 7.1 11/14/2020   HGB 12.9 11/14/2020   HCT 38.1 11/14/2020   MCV 94.4 11/14/2020   PLT 231.0 11/14/2020   Lab Results  Component Value Date   NA 137 06/18/2021   K 4.1 06/18/2021   CO2 30 06/18/2021   GLUCOSE 102 (H) 06/18/2021   BUN 20 06/18/2021   CREATININE 0.93 06/18/2021   BILITOT 0.9 11/14/2020   ALKPHOS 88 11/14/2020   AST 14 11/14/2020   ALT 9 11/14/2020   PROT 6.9 11/14/2020   ALBUMIN 4.3 11/14/2020   CALCIUM 9.5 06/18/2021   ANIONGAP 7 06/18/2021   GFR 51.70 (L) 11/14/2020   Lab Results  Component Value Date   CHOL 154 11/14/2020   Lab Results  Component Value Date   HDL 44.50 11/14/2020   Lab Results  Component Value Date   LDLCALC 78 11/14/2020   Lab Results  Component Value  Date   TRIG 159.0 (H) 11/14/2020   Lab Results  Component Value Date   CHOLHDL 3 11/14/2020   Lab Results  Component Value Date   HGBA1C 6.0 05/19/2021       Assessment & Plan:   Problem List Items Addressed This Visit     Essential hypertension -  Primary   Relevant Medications   lisinopril (ZESTRIL) 10 MG tablet   Other Relevant Orders   CBC   Comprehensive metabolic panel   TSH   Hyperlipidemia, mixed    Tolerating statin, encouraged heart healthy diet, avoid trans fats, minimize simple carbs and saturated fats. Increase exercise as tolerated. Dr Atorvastatin      Relevant Medications   lisinopril (ZESTRIL) 10 MG tablet   Other Relevant Orders   Lipid panel   Obesity    Encouraged DASH or MIND diet, decrease po intake and increase exercise as tolerated. Needs 7-8 hours of sleep nightly. Avoid trans fats, eat small, frequent meals every 4-5 hours with lean proteins, complex carbs and healthy fats. Minimize simple carbs, high fat foods and processed foods. Consider [phone] or Saxenda      OA (osteoarthritis)   Hyperglycemia   Relevant Orders   Hemoglobin A1c   Persistent atrial fibrillation ([place])    Is having more persistent symptoms, follows with cardiology, her cardiologist is leaving clinical practice so she is moving to [place]. Last episode was 3-4 weeks ago. When she has an episode she takes extra Cardizem every 4 hours. No obvious triggers.       Relevant Medications   lisinopril (ZESTRIL) 10 MG tablet   Other Visit Diagnoses     Encounter for screening for malignant neoplasm of breast, unspecified screening modality       Breast cancer screening by mammogram       Relevant Orders   MM 3D SCREEN BREAST BILATERAL       Meds ordered this encounter  Medications   lisinopril (ZESTRIL) 10 MG tablet    Sig: Take 1 tablet (10 mg total) by mouth daily.    Dispense:  90 tablet    Refill:  1    I,Zite Okoli,acting as a scribe for [name], MD.,have  documented all relevant documentation on the behalf of [name], MD,as directed by  [name], MD while in the presence of [name], MD.   I, [name], MD., personally preformed the services described in this documentation.  All medical record entries made by the scribe were at my direction and in my presence.  I have reviewed the chart and discharge instructions (if applicable) and agree that the record reflects my personal performance and is accurate and complete. 09/22/2021

## 2021-09-23 ENCOUNTER — Telehealth (HOSPITAL_BASED_OUTPATIENT_CLINIC_OR_DEPARTMENT_OTHER): Payer: Self-pay

## 2021-09-28 ENCOUNTER — Other Ambulatory Visit: Payer: Self-pay | Admitting: Family Medicine

## 2021-09-29 ENCOUNTER — Ambulatory Visit (INDEPENDENT_AMBULATORY_CARE_PROVIDER_SITE_OTHER): Payer: Medicare Other

## 2021-09-29 DIAGNOSIS — I480 Paroxysmal atrial fibrillation: Secondary | ICD-10-CM | POA: Diagnosis not present

## 2021-09-29 LAB — CUP PACEART REMOTE DEVICE CHECK
Date Time Interrogation Session: 20230129230844
Implantable Pulse Generator Implant Date: 20210322

## 2021-10-07 NOTE — Progress Notes (Signed)
Carelink Summary Report / Loop Recorder 

## 2021-10-13 DIAGNOSIS — H26493 Other secondary cataract, bilateral: Secondary | ICD-10-CM | POA: Diagnosis not present

## 2021-10-13 DIAGNOSIS — H40013 Open angle with borderline findings, low risk, bilateral: Secondary | ICD-10-CM | POA: Diagnosis not present

## 2021-10-13 DIAGNOSIS — H353131 Nonexudative age-related macular degeneration, bilateral, early dry stage: Secondary | ICD-10-CM | POA: Diagnosis not present

## 2021-10-13 DIAGNOSIS — H04123 Dry eye syndrome of bilateral lacrimal glands: Secondary | ICD-10-CM | POA: Diagnosis not present

## 2021-10-13 DIAGNOSIS — H5213 Myopia, bilateral: Secondary | ICD-10-CM | POA: Diagnosis not present

## 2021-10-20 ENCOUNTER — Other Ambulatory Visit (HOSPITAL_COMMUNITY): Payer: Self-pay | Admitting: *Deleted

## 2021-10-20 MED ORDER — DILTIAZEM HCL 30 MG PO TABS: ORAL_TABLET | ORAL | 1 refills | Status: DC

## 2021-10-23 ENCOUNTER — Other Ambulatory Visit: Payer: Self-pay

## 2021-10-23 ENCOUNTER — Ambulatory Visit (HOSPITAL_COMMUNITY)
Admission: RE | Admit: 2021-10-23 | Discharge: 2021-10-23 | Disposition: A | Discharge status: Home / Self Care | Payer: Medicare Other | Source: Ambulatory Visit | Attending: Nurse Practitioner | Admitting: Nurse Practitioner

## 2021-10-23 ENCOUNTER — Ambulatory Visit (HOSPITAL_COMMUNITY): Payer: Medicare Other | Admitting: Nurse Practitioner

## 2021-10-23 ENCOUNTER — Encounter (HOSPITAL_COMMUNITY): Payer: Self-pay | Admitting: Nurse Practitioner

## 2021-10-23 VITALS — BP 146/74 | HR 99 | Ht 63.0 in | Wt 283.8 lb

## 2021-10-23 DIAGNOSIS — I480 Paroxysmal atrial fibrillation: Secondary | ICD-10-CM | POA: Diagnosis not present

## 2021-10-23 DIAGNOSIS — I4819 Other persistent atrial fibrillation: Secondary | ICD-10-CM | POA: Insufficient documentation

## 2021-10-23 DIAGNOSIS — I4892 Unspecified atrial flutter: Secondary | ICD-10-CM | POA: Diagnosis not present

## 2021-10-23 DIAGNOSIS — I4439 Other atrioventricular block: Secondary | ICD-10-CM | POA: Diagnosis not present

## 2021-10-23 DIAGNOSIS — Z9989 Dependence on other enabling machines and devices: Secondary | ICD-10-CM | POA: Diagnosis not present

## 2021-10-23 DIAGNOSIS — G4733 Obstructive sleep apnea (adult) (pediatric): Secondary | ICD-10-CM | POA: Insufficient documentation

## 2021-10-23 DIAGNOSIS — Z7901 Long term (current) use of anticoagulants: Secondary | ICD-10-CM | POA: Insufficient documentation

## 2021-10-23 DIAGNOSIS — I10 Essential (primary) hypertension: Secondary | ICD-10-CM | POA: Insufficient documentation

## 2021-10-23 DIAGNOSIS — Z79899 Other long term (current) drug therapy: Secondary | ICD-10-CM | POA: Diagnosis not present

## 2021-10-23 LAB — COMPREHENSIVE METABOLIC PANEL
ALT: 13 U/L (ref 0–44)
AST: 18 U/L (ref 15–41)
Albumin: 3.9 g/dL (ref 3.5–5.0)
Alkaline Phosphatase: 72 U/L (ref 38–126)
Anion gap: 8 (ref 5–15)
BUN: 17 mg/dL (ref 8–23)
CO2: 28 mmol/L (ref 22–32)
Calcium: 9.4 mg/dL (ref 8.9–10.3)
Chloride: 103 mmol/L (ref 98–111)
Creatinine, Ser: 1.08 mg/dL — ABNORMAL HIGH (ref 0.44–1.00)
GFR, Estimated: 50 mL/min — ABNORMAL LOW (ref >60)
Glucose, Bld: 114 mg/dL — ABNORMAL HIGH (ref 70–99)
Potassium: 4 mmol/L (ref 3.5–5.1)
Sodium: 139 mmol/L (ref 135–145)
Total Bilirubin: 0.9 mg/dL (ref 0.3–1.2)
Total Protein: 6.7 g/dL (ref 6.5–8.1)

## 2021-10-23 LAB — MAGNESIUM: Magnesium: 2.1 mg/dL (ref 1.7–2.4)

## 2021-10-23 MED ORDER — METOPROLOL SUCCINATE ER 50 MG PO TB24: 50.0000 mg | ORAL_TABLET | Freq: Two times a day (BID) | ORAL | 3 refills | Status: DC

## 2021-10-23 NOTE — Progress Notes (Addendum)
PCP: [name], MD  [name] is a 88 y.o. female who presents today for  electrophysiology follow up afib ablation 6 weeks ago. She had developed medicine refractory atrial fibrillation. She had  hematoma of the abdomen and both groins that gradually cleared. She continued  on tikosyn.  She has had intermittent afib since the procedure and was in afib  at one month f/u. She continued to watch her heart rates for the last couple of weeks and is consistently out of rhythm. She has been able to return to exercising in the pool and this has made her feel better. Her v rates are quite elevated in the 120's. Higher does of BB in the past have not helped and increased dose of diltiazem increases her swelling.  No swallowing issues since the procedure.   F/u in afib clinic,10/15, she is s/p successful cardioversion and remains in SR. She is very happy about this and is feeling much improved.   F/u in afib clinic, 05/02/20. She has received a Linq since I saw her last, and the device clinic let her know yesterday that she was out of rhythm. EKG today shows atrial flutter at 124 ms. She continues on eliquis and tikosyn. CHA2DS2VASc score is  6.No trigger that pt is aware of. She did increase metoprolol to 50 mg bid.   F/u in afib clinic, 06/18/21. She is in SR.  She will still have days of afib, will usually convert back to SR with extra metoprolol but that makes her feel very tired. She continues on Tikosyn. Last paceart showed afib burden fairly  low at 7.6%.   F/u in afib 10/23/21. She has had more persistent afib for the last 2 weeks.  She will use 30 mg cardizem as she will have v rates in the 140's. She often will return to SR spontaneously.   Today, she denies symptoms of palpitations, chest pain,   lower extremity edema, dizziness, presyncope, or syncope.  The patient is otherwise without complaint today.   Past Medical History:  Diagnosis Date   Anemia    Aortic  stenosis    Very mild   Benign paroxysmal positional vertigo 12/17/2013   Chicken pox as a child   Colon polyp    Coronary artery disease    DDD (degenerative disc disease) 11/16/2013   Hyperlipidemia    Hypertension    Iron deficiency anemia 11/13/2013   Mumps child and teenager   OA (osteoarthritis) 11/19/2013   S/p L TKR   Obesity    OSA on CPAP    Pancreatitis    post hysterectomy   Persistent atrial fibrillation (HCC)    Personal history of colonic polyps 02/25/2014   Personal history of DVT (deep vein thrombosis) X 2   "left"   Sleep apnea    Spinal stenosis    Past Surgical History:  Procedure Laterality Date   APPENDECTOMY     ATRIAL FIBRILLATION ABLATION N/A 04/25/2019   Procedure: ATRIAL FIBRILLATION ABLATION;  Surgeon: [name], MD;  Location: [place] [facility];  Service: Cardiovascular;  Laterality: N/A;   CARDIOVERSION N/A 08/09/2015   Procedure: CARDIOVERSION;  Surgeon: Jerline Pain, MD;  Location: [place];  Service: Cardiovascular;  Laterality: N/A;   CARDIOVERSION N/A 09/30/2018   Procedure: CARDIOVERSION;  Surgeon: [name], MD;  Location: [facility] ENDOSCOPY;  Service: Cardiovascular;  Laterality: N/A;   CARDIOVERSION N/A 02/27/2019   Procedure: CARDIOVERSION;  Surgeon: Buford Dresser,  MD;  Location: [place];  Service: Cardiovascular;  Laterality: N/A;   CARDIOVERSION N/A 06/07/2019   Procedure: CARDIOVERSION;  Surgeon: [name], MD;  Location: [facility] ENDOSCOPY;  Service: Cardiovascular;  Laterality: N/A;   CATARACT EXTRACTION W/ INTRAOCULAR LENS  IMPLANT, BILATERAL Bilateral 2016   CYST REMOVAL HAND Bilateral 1990's   "played to much golf"   DILATION AND CURETTAGE OF UTERUS     implantable loop recorder placement  11/20/2019   Medtronic Reveal Linq model TLX 72 (model number [id number]) implanted in office by Dr Rayann Heman for afib management   LUMBAR LAMINECTOMY  40 yrs ago   "L3-4"   MENISCUS REPAIR Bilateral 2000 and 2010    REPLACEMENT TOTAL KNEE Left 2013   TEE WITHOUT CARDIOVERSION N/A 04/24/2019   Procedure: TRANSESOPHAGEAL ECHOCARDIOGRAM (TEE);  Surgeon: Thayer Headings, MD;  Location: [facility] ENDOSCOPY;  Service: Cardiovascular;  Laterality: N/A;   TONSILECTOMY, ADENOIDECTOMY, BILATERAL MYRINGOTOMY AND TUBES  child   TOTAL ABDOMINAL HYSTERECTOMY  1995   had 2 tumors- benign   TUBAL LIGATION  88 years old    ROS- all systems are reviewed and negatives except as per HPI above  Current Outpatient Medications  Medication Sig Dispense Refill   acetaminophen (TYLENOL) 325 MG tablet Take 325 mg by mouth daily as needed (pain.).     apixaban (ELIQUIS) 5 MG TABS tablet Take 1 tablet (5 mg total) by mouth in the morning and at bedtime. 180 tablet 1   atorvastatin (LIPITOR) 20 MG tablet TAKE 1 TABLET BY MOUTH  DAILY 90 tablet 3   bumetanide (BUMEX) 1 MG tablet Take 1 tablet (1 mg total) by mouth daily. 90 tablet 1   Calcium Carb-Cholecalciferol (CALCIUM 600 + D PO) Take 1 tablet by mouth 2 (two) times daily.     Carboxymethylcellul-Glycerin (LUBRICATING EYE DROPS OP) Place 1 drop into both eyes in the morning and at bedtime.     diltiazem (CARDIZEM CD) 120 MG 24 hr capsule Take 1 capsule (120 mg total) by mouth daily. 90 capsule 3   diltiazem (CARDIZEM) 30 MG tablet Take 1 Tablet Every 4 Hours As Needed For HR >100 and Top BP >100 45 tablet 1   dofetilide (TIKOSYN) 250 MCG capsule TAKE ONE (1) CAPSULE BY MOUTH 2 TIMES DAILY 60 capsule 11   lisinopril (ZESTRIL) 10 MG tablet Take 1 tablet (10 mg total) by mouth daily. 90 tablet 1   loratadine (CLARITIN) 10 MG tablet Take 10 mg by mouth daily.      metoprolol succinate (TOPROL-XL) 25 MG 24 hr tablet Take 1.5 tablets (37.5 mg total) by mouth 2 (two) times daily. 270 tablet 2   Multiple Vitamins-Minerals (ONE-A-DAY WOMENS 50 PLUS PO) Take 1 tablet by mouth daily.     potassium chloride (KLOR-CON) 10 MEQ tablet TAKE 1 TABLET BY MOUTH IN  THE MORNING AND 2 TABLETS  BY MOUTH IN  THE EVENING 270 tablet 3   Probiotic CAPS Take 1 capsule by mouth daily.     psyllium (REGULOID) 0.52 g capsule Take 0.52 g by mouth 2 (two) times daily.      No current facility-administered medications for this encounter.    Physical Exam: There were no vitals filed for this visit.   GEN- The patient is well appearing, alert and oriented x 3 today.   Head- normocephalic, atraumatic Eyes-  Sclera clear, conjunctiva pink Ears- hearing intact Oropharynx- clear Lungs- Clear to ausculation bilaterally, normal work of breathing Heart- tachycardic irregular rhythm GI- soft,  NT, ND, + BS Extremities- no clubbing, cyanosis, or edema  Wt Readings from Last 3 Encounters:  09/22/21 129.7 kg  06/19/21 128.3 kg  06/18/21 128.6 kg    EKG - aflutter with variable block at 99 bpm.qtc 413 ms   Recent echo is also reviewed  Assessment and Plan:  1. Persistent atrial fibrillation/flutter  S/p afib ablation 04/25/19 Required CV right after ablation but since then has had progressive increase in afib burden,  will usually self convert, but the extra  CCB/metoprolol to convert will make her extra tired Continue Tikosyn/diltaizem /metorpolol but will try to increase BB short term to see if can convert  I will see back in one week but if has not converted with continued RVR I will send to see about a PPM with AVN ablation  She is not an ablation candidate per pulmonology  Bmet/mag today for tikosyn surveillance   2.OSA She reports compliance with CPAP  3. HTN Stable   4. CHA2DS2VASc  score of at least 6 Continue eliquis 5 mg bid     Geroge Baseman. Erisa Mehlman, [facility] [address] [place], [place] [address] [phone]

## 2021-10-23 NOTE — Patient Instructions (Signed)
Increase metoprolol to 50mg twice a day 

## 2021-10-30 ENCOUNTER — Telehealth (HOSPITAL_COMMUNITY): Payer: Self-pay | Admitting: *Deleted

## 2021-10-30 ENCOUNTER — Ambulatory Visit (HOSPITAL_COMMUNITY): Payer: Medicare Other | Admitting: Nurse Practitioner

## 2021-10-30 NOTE — Telephone Encounter (Signed)
Patient returned to normal rhythm on Tuesday and HR are in the 70s she feels great on higher dose of metoprolol. Per [name] NP stay on higher dose of metoprolol and will establish with new EP (former allred pt). Pt in agreement. ?

## 2021-11-03 ENCOUNTER — Ambulatory Visit (INDEPENDENT_AMBULATORY_CARE_PROVIDER_SITE_OTHER): Payer: Medicare Other

## 2021-11-03 DIAGNOSIS — I480 Paroxysmal atrial fibrillation: Secondary | ICD-10-CM | POA: Diagnosis not present

## 2021-11-03 LAB — CUP PACEART REMOTE DEVICE CHECK
Date Time Interrogation Session: 20230306121623
Implantable Pulse Generator Implant Date: 20210322

## 2021-11-10 ENCOUNTER — Encounter (HOSPITAL_BASED_OUTPATIENT_CLINIC_OR_DEPARTMENT_OTHER): Payer: Self-pay

## 2021-11-10 ENCOUNTER — Other Ambulatory Visit: Payer: Self-pay

## 2021-11-10 ENCOUNTER — Ambulatory Visit (HOSPITAL_BASED_OUTPATIENT_CLINIC_OR_DEPARTMENT_OTHER)
Admission: RE | Admit: 2021-11-10 | Discharge: 2021-11-10 | Disposition: A | Discharge status: Home / Self Care | Payer: Medicare Other | Source: Ambulatory Visit | Attending: Family Medicine | Admitting: Family Medicine

## 2021-11-10 DIAGNOSIS — Z1231 Encounter for screening mammogram for malignant neoplasm of breast: Secondary | ICD-10-CM | POA: Diagnosis not present

## 2021-11-13 NOTE — Progress Notes (Signed)
Carelink Summary Report / Loop Recorder 

## 2021-11-19 DIAGNOSIS — Z20822 Contact with and (suspected) exposure to covid-19: Secondary | ICD-10-CM | POA: Diagnosis not present

## 2021-11-21 ENCOUNTER — Encounter: Payer: Self-pay | Admitting: Adult Health

## 2021-11-21 ENCOUNTER — Ambulatory Visit (INDEPENDENT_AMBULATORY_CARE_PROVIDER_SITE_OTHER): Payer: Medicare Other | Admitting: Adult Health

## 2021-11-21 ENCOUNTER — Other Ambulatory Visit: Payer: Self-pay

## 2021-11-21 DIAGNOSIS — J8410 Pulmonary fibrosis, unspecified: Secondary | ICD-10-CM

## 2021-11-21 DIAGNOSIS — I480 Paroxysmal atrial fibrillation: Secondary | ICD-10-CM | POA: Diagnosis not present

## 2021-11-21 DIAGNOSIS — G4733 Obstructive sleep apnea (adult) (pediatric): Secondary | ICD-10-CM

## 2021-11-21 NOTE — Assessment & Plan Note (Addendum)
Patient with recurrent A-fib with RVR.  She has symptom burden with this.  Continue follow-up with cardiology and her current maintenance regimen.  Remains on Eliquis. ?

## 2021-11-21 NOTE — Assessment & Plan Note (Signed)
Mild on CT chest.  PFTs normal most likely from previous amiodarone use.  Clinically she has been stable.  We will continue to monitor.  Additional testing only if she becomes symptomatic. ?

## 2021-11-21 NOTE — Patient Instructions (Signed)
Great job with BIPAP  ?Keep up good work .  ?Work on healthy weight loss ?Do not drive if sleepy.  ?Activity as tolerated.  ?Continue follow up with Cardiology . ?Follow up with Dr. Halford Chessman or Mouhamad Teed NP in 1 year and As needed   ? ? ?

## 2021-11-21 NOTE — Progress Notes (Signed)
? ?'@Patient'$  ID: Tracy Miles, female    DOB: 1934-08-21, 88 y.o.   MRN: [id number] ? ?Chief Complaint  ?Patient presents with  ? Follow-up  ? ? ?Referring provider: ?[name], MD ? ?HPI: ?88 year old female never smoker followed for obstructive sleep apnea on BiPAP at bedtime and pulmonary fibrosis (suspected from amiodarone use) ?Medical history significant for persistent A-fib despite repeat Cardioversion/ablation.  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 ?History of amiodarone use in 2011 for 6 months.  ?Lives at [facility] a private home ? ?TEST/EVENTS :  ?PSG 03/07/15 >had trouble sleeping due to back pain ?(total sleep time was only 243 min ) AHI 10.4./hr  (AHI during REM 74.7 /hr )  Low sat 84%. Severe PLMS.  ?  ?August 2020 pan ANCA panel negative, ANA negative, rheumatoid factor negative ?  ?High-resolution CT chest March 31, 2019 mild basilar predominant subpleural fibrosis can be seen with amiodarone use-nonspecific interstitial pneumonitis or early mild usual interstitial pneumonitis due to other etiologies.   ?  ?ILD Workup  ?From [place] , lived in [place] for 18 yrs  ?Education officer, museum, homemaker.  ?Second hand smoke as child  ?Frequent bronchitis as child and adult. (frequently long time to get over) . No official dx of Asthma .  ?Mild rhinitis  ?Previous Cat . (no bird/chickens)  ?No hot tub.  ?No recent travel. (previous international travel -nothing in last 10 yrs. ) ?No known autoimmune disease  ?No long term macrodantin. No chemo use. No methotrexate use.  ?On Amiodarone in past for ~ 6 months , taken off 2014 due to eye issues.  ?No unusual hobbies . Occasional refinish furniture ? Lives alone, does light house work .  ?Goes to indoor pool , walks in pool .  ?No dysphagia or choking or GERD .  ?  ?ANA [name] neg .  ?  ? ?11/21/2021 Follow up ; OSA  ?Patient returns for follow-up.  Patient was last seen 2021.  Patient has underlying obstructive sleep apnea is on nocturnal BiPAP.   Patient says she wears her BiPAP every single night cannot sleep without it.  Patient says she feels rested and feels that she benefits from BiPAP.  BiPAP download shows excellent compliance with 100% usage.  Daily average usage at 8 hours.  Patient is on auto BiPAP with IPAP max at 16 cm H2O.  And EPAP minimum at 12 cm H2O.  AHI 1.9.  Patient says she tries to remain active.  Goes to the pool to exercise 3 days a week. ?She says her activities are somewhat limited by her A-fib.  She still has recurrent A-fib with RVR with heart rate sometimes from 1 30-1 50.  She has a loop recorder and is followed by cardiology.  She is on Cardizem, Tikosyn and metoprolol.  She denies any increased cough wheezing or edema. ? ?Patient has known ILD likely from previous amiodarone use.  CT chest July 2020 showed very mild changes.  And PFT was normal.  She has no significant symptoms.  ? ? ? ? ?Allergies  ?Allergen Reactions  ? Gabapentin Swelling  ? Lactose Intolerance (Gi) Other (See Comments)  ?  Bothers her stomach  ? Zebeta [Bisoprolol Fumarate] Nausea Only  ? Penicillins Hives and Rash  ?  Did it involve swelling of the face/tongue/throat, SOB, or low BP? No ?Did it involve sudden or severe rash/hives, skin peeling, or any reaction on the inside of your mouth or nose? Yes ?Did you need  to seek medical attention at a hospital or doctor's office? No ?When did it last happen?       25+ years ?If all above answers are ?NO?, may proceed with cephalosporin use. ?  ? Sulfa Antibiotics Rash  ? ? ?Immunization History  ?Administered Date(s) Administered  ? Fluad Quad(high Dose 65+) 06/16/2019, 05/19/2021  ? Influenza, High Dose Seasonal PF 05/11/2017, 05/24/2018  ? Influenza,inj,Quad PF,6+ Mos 04/30/2016  ? Influenza-Unspecified 05/31/2013, 05/01/2014, 06/11/2015  ? Moderna Sars-Covid-2 Vaccination 09/13/2019, 10/10/2019, 07/03/2020, 12/18/2020  ? Pension scheme manager 36yr & up 06/12/2021  ? Pneumococcal  Conjugate-13 04/02/2014  ? Pneumococcal Polysaccharide-23 08/31/1998, 09/06/2015  ? Tdap 07/31/2012  ? Zoster Recombinat (Shingrix) 04/19/2018, 06/27/2018  ? ? ?Past Medical History:  ?Diagnosis Date  ? Anemia   ? Aortic stenosis   ? Very mild  ? Benign paroxysmal positional vertigo 12/17/2013  ? Chicken pox as a child  ? Colon polyp   ? Coronary artery disease   ? DDD (degenerative disc disease) 11/16/2013  ? Hyperlipidemia   ? Hypertension   ? Iron deficiency anemia 11/13/2013  ? Mumps child and teenager  ? OA (osteoarthritis) 11/19/2013  ? S/p L TKR  ? Obesity   ? OSA on CPAP   ? Pancreatitis   ? post hysterectomy  ? [place]   ? Personal history of colonic polyps 02/25/2014  ? Personal history of DVT (deep vein thrombosis) X 2  ? "left"  ? Sleep apnea   ? Spinal stenosis   ? ? ?Tobacco History: ?Social History  ? ?Tobacco Use  ?Smoking Status Never  ?Smokeless Tobacco Never  ?Tobacco Comments  ? never used tobacco  ? ?Counseling given: Not Answered ?Tobacco comments: never used tobacco ? ? ?Outpatient Medications Prior to Visit  ?Medication Sig Dispense Refill  ? acetaminophen (TYLENOL) 325 MG tablet Take 325 mg by mouth daily as needed (pain.).    ? apixaban (ELIQUIS) 5 MG TABS tablet Take 1 tablet (5 mg total) by mouth in the morning and at bedtime. 180 tablet 1  ? atorvastatin (LIPITOR) 20 MG tablet TAKE 1 TABLET BY MOUTH  DAILY 90 tablet 3  ? bumetanide (BUMEX) 1 MG tablet Take 1 tablet (1 mg total) by mouth daily. 90 tablet 1  ? Calcium Carb-Cholecalciferol (CALCIUM 600 + D PO) Take 1 tablet by mouth 2 (two) times daily.    ? Carboxymethylcellul-Glycerin (LUBRICATING EYE DROPS OP) Place 1 drop into both eyes in the morning and at bedtime.    ? clindamycin (CLEOCIN) 300 MG capsule As needed for dental work    ? diltiazem (CARDIZEM CD) 120 MG 24 hr capsule Take 1 capsule (120 mg total) by mouth daily. 90 capsule 3  ? diltiazem (CARDIZEM) 30 MG tablet Take 1 Tablet Every 4 Hours As  Needed For HR >100 and Top BP >100 45 tablet 1  ? dofetilide (TIKOSYN) 250 MCG capsule TAKE ONE (1) CAPSULE BY MOUTH 2 TIMES DAILY 60 capsule 11  ? lisinopril (ZESTRIL) 10 MG tablet Take 1 tablet (10 mg total) by mouth daily. 90 tablet 1  ? loratadine (CLARITIN) 10 MG tablet Take 10 mg by mouth daily.     ? metoprolol succinate (TOPROL-XL) 50 MG 24 hr tablet Take 1 tablet (50 mg total) by mouth 2 (two) times daily. 60 tablet 3  ? Multiple Vitamins-Minerals (ONE-A-DAY WOMENS 50 PLUS PO) Take 1 tablet by mouth daily.    ? potassium chloride (KLOR-CON) 10 MEQ tablet TAKE  1 TABLET BY MOUTH IN  THE MORNING AND 2 TABLETS  BY MOUTH IN THE EVENING 270 tablet 3  ? Probiotic CAPS Take 1 capsule by mouth daily.    ? psyllium (REGULOID) 0.52 g capsule Take 0.52 g by mouth 2 (two) times daily.     ? ?No facility-administered medications prior to visit.  ? ? ? ?Review of Systems:  ? ?Constitutional:   No  weight loss, night sweats,  Fevers, chills,  ?+fatigue, or  lassitude. ? ?HEENT:   No headaches,  Difficulty swallowing,  Tooth/dental problems, or  Sore throat,  ?              No sneezing, itching, ear ache, nasal congestion, post nasal drip,  ? ?CV:  No chest pain,  Orthopnea, PND, swelling in lower extremities, anasarca, dizziness, syncope.  ? ?GI  No heartburn, indigestion, abdominal pain, nausea, vomiting, diarrhea, change in bowel habits, loss of appetite, bloody stools.  ? ?Resp:  No excess mucus, no productive cough,  No non-productive cough,  No coughing up of blood.  No change in color of mucus.  No wheezing.  No chest wall deformity ? ?Skin: no rash or lesions. ? ?GU: no dysuria, change in color of urine, no urgency or frequency.  No flank pain, no hematuria  ? ?MS:  No joint pain or swelling.  No decreased range of motion.  No back pain. ? ? ? ?Physical Exam ? ?BP 120/80 (BP Location: Right Wrist, Patient Position: Sitting, Cuff Size: Normal)   Pulse (!) 101   Temp 98.1 ?F (36.7 ?C) (Oral)   Ht '5\' 3"'$  (1.6 m)    Wt 284 lb (128.8 kg)   SpO2 98%   BMI 50.31 kg/m?  ? ?GEN: A/Ox3; pleasant , NAD, well nourished  ?  ?HEENT:  [place]/AT,  NOSE-clear, THROAT-clear, no lesions, no postnasal drip or exudate noted.  Class III MP airway ? ?NECK:

## 2021-11-21 NOTE — Assessment & Plan Note (Signed)
Obstructive sleep apnea with excellent control and compliance on nocturnal BiPAP ?Marland Kitchen  Patient education was given on sleep apnea.  And CPAP care. ? ?Plan  ?Patient Instructions  ?Great job with BIPAP  ?Keep up good work .  ?Work on healthy weight loss ?Do not drive if sleepy.  ?Activity as tolerated.  ?Continue follow up with Cardiology . ?Follow up with Dr. Halford Chessman or Michaeline Eckersley NP in 1 year and As needed   ? ? ?  ? ?

## 2021-11-22 NOTE — Progress Notes (Signed)
Reviewed and agree with assessment/plan. ? ? ?Chesley Mires, MD ?[place] ?11/22/2021, 7:10 AM ?Pager:  [phone] ? ?

## 2021-12-08 ENCOUNTER — Ambulatory Visit (INDEPENDENT_AMBULATORY_CARE_PROVIDER_SITE_OTHER): Payer: Medicare Other

## 2021-12-08 DIAGNOSIS — I4819 Other persistent atrial fibrillation: Secondary | ICD-10-CM

## 2021-12-09 LAB — CUP PACEART REMOTE DEVICE CHECK
Date Time Interrogation Session: 20230411113922
Implantable Pulse Generator Implant Date: 20210322

## 2021-12-15 ENCOUNTER — Other Ambulatory Visit: Payer: Self-pay | Admitting: Internal Medicine

## 2021-12-15 ENCOUNTER — Other Ambulatory Visit: Payer: Self-pay | Admitting: Family Medicine

## 2021-12-16 ENCOUNTER — Other Ambulatory Visit (HOSPITAL_COMMUNITY): Payer: Self-pay | Admitting: *Deleted

## 2021-12-16 MED ORDER — DOFETILIDE 250 MCG PO CAPS: 250.0000 ug | ORAL_CAPSULE | Freq: Two times a day (BID) | ORAL | 1 refills | Status: DC

## 2021-12-17 ENCOUNTER — Ambulatory Visit (HOSPITAL_COMMUNITY)
Admission: RE | Admit: 2021-12-17 | Discharge: 2021-12-17 | Disposition: A | Discharge status: Home / Self Care | Payer: Medicare Other | Source: Ambulatory Visit | Attending: Nurse Practitioner | Admitting: Nurse Practitioner

## 2021-12-17 ENCOUNTER — Encounter (HOSPITAL_COMMUNITY): Payer: Self-pay | Admitting: Nurse Practitioner

## 2021-12-17 VITALS — BP 146/84 | HR 101 | Ht 63.0 in | Wt 278.2 lb

## 2021-12-17 DIAGNOSIS — Z79899 Other long term (current) drug therapy: Secondary | ICD-10-CM | POA: Diagnosis not present

## 2021-12-17 DIAGNOSIS — Z7901 Long term (current) use of anticoagulants: Secondary | ICD-10-CM | POA: Diagnosis not present

## 2021-12-17 DIAGNOSIS — I4892 Unspecified atrial flutter: Secondary | ICD-10-CM | POA: Diagnosis not present

## 2021-12-17 DIAGNOSIS — G4733 Obstructive sleep apnea (adult) (pediatric): Secondary | ICD-10-CM | POA: Diagnosis not present

## 2021-12-17 DIAGNOSIS — D6869 Other thrombophilia: Secondary | ICD-10-CM

## 2021-12-17 DIAGNOSIS — Z9989 Dependence on other enabling machines and devices: Secondary | ICD-10-CM | POA: Diagnosis not present

## 2021-12-17 DIAGNOSIS — I4819 Other persistent atrial fibrillation: Secondary | ICD-10-CM | POA: Diagnosis not present

## 2021-12-17 DIAGNOSIS — I10 Essential (primary) hypertension: Secondary | ICD-10-CM | POA: Insufficient documentation

## 2021-12-17 NOTE — Progress Notes (Signed)
? ?PCP: [name], MD ? Primary EP: Dr Rayann Heman ? ?Tracy Miles is a 88 y.o. female who presents today for  electrophysiology follow up afib ablation 6 weeks ago. She had developed medicine refractory atrial fibrillation. She had  hematoma of the abdomen and both groins that gradually cleared. She continued  on tikosyn.  She has had intermittent afib since the procedure and was in afib  at one month f/u. She continued to watch her heart rates for the last couple of weeks and is consistently out of rhythm. She has been able to return to exercising in the pool and this has made her feel better. Her v rates are quite elevated in the 120's. Higher does of BB in the past have not helped and increased dose of diltiazem increases her swelling.  No swallowing issues since the procedure.  ? ?F/u in afib clinic,10/15, she is s/p successful cardioversion and remains in SR. She is very happy about this and is feeling much improved.  ? ?F/u in afib clinic, 05/02/20. She has received a Linq since I saw her last, and the device clinic let her know yesterday that she was out of rhythm. EKG today shows atrial flutter at 124 ms. She continues on eliquis and tikosyn. CHA2DS2VASc score is  6.No trigger that pt is aware of. She did increase metoprolol to 50 mg bid.  ? ?F/u in afib clinic, 06/18/21. She is in SR.  She will still have days of afib, will usually convert back to SR with extra metoprolol but that makes her feel very tired. She continues on Tikosyn. Last paceart showed afib burden fairly  low at 7.6%.  ? ?F/u in afib 10/23/21. She has had more persistent afib for the last 2 weeks.  She will use 30 mg cardizem as she will have v rates in the 140's. She often will return to SR spontaneously.  ? ?F/u in afib clinic 12/17/21. She is in atrial flutter reasonably controlled. She is very tired. Her afib burden has increased over the last 3 months,Tikosyn has become ineffective. She will usually brake on her own after several  weeks. She had talked to Dr. Rayann Heman in  the past and he did not want to do another ablation, I feel mostly for her age limitations. She feels the higher doses of meds to control v rates contribute to her fatigue. ? ?Today, she denies symptoms of palpitations, chest pain,   lower extremity edema, dizziness, presyncope, or syncope.  The patient is otherwise without complaint today.  ? ?Past Medical History:  ?Diagnosis Date  ? Anemia   ? Aortic stenosis   ? Very mild  ? Benign paroxysmal positional vertigo 12/17/2013  ? Chicken pox as a child  ? Colon polyp   ? Coronary artery disease   ? DDD (degenerative disc disease) 11/16/2013  ? Hyperlipidemia   ? Hypertension   ? Iron deficiency anemia 11/13/2013  ? Mumps child and teenager  ? OA (osteoarthritis) 11/19/2013  ? S/p L TKR  ? Obesity   ? OSA on CPAP   ? Pancreatitis   ? post hysterectomy  ? Persistent atrial fibrillation ([place])   ? Personal history of colonic polyps 02/25/2014  ? Personal history of DVT (deep vein thrombosis) X 2  ? "left"  ? Sleep apnea   ? Spinal stenosis   ? ?Past Surgical History:  ?Procedure Laterality Date  ? APPENDECTOMY    ? ATRIAL FIBRILLATION ABLATION N/A 04/25/2019  ? Procedure: ATRIAL FIBRILLATION  ABLATION;  Surgeon: [name], MD;  Location: [place] [facility];  Service: Cardiovascular;  Laterality: N/A;  ? CARDIOVERSION N/A 08/09/2015  ? Procedure: CARDIOVERSION;  Surgeon: Jerline Pain, MD;  Location: [place];  Service: Cardiovascular;  Laterality: N/A;  ? CARDIOVERSION N/A 09/30/2018  ? Procedure: CARDIOVERSION;  Surgeon: [name], MD;  Location: [place];  Service: Cardiovascular;  Laterality: N/A;  ? CARDIOVERSION N/A 02/27/2019  ? Procedure: CARDIOVERSION;  Surgeon: Buford Dresser, MD;  Location[facility] ENDOSCOPY;  Service: Cardiovascular;  Laterality: N/A;  ? CARDIOVERSION N/A 06/07/2019  ? Procedure: CARDIOVERSION;  Surgeon: [name], MD;  Location[facility] ENDOSCOPY;  Service: Cardiovascular;  Laterality:  N/A;  ? CATARACT EXTRACTION W/ INTRAOCULAR LENS  IMPLANT, BILATERAL Bilateral 2016  ? CYST REMOVAL HAND Bilateral 1990's  ? "played to much golf"  ? DILATION AND CURETTAGE OF UTERUS    ? implantable loop recorder placement  11/20/2019  ? Medtronic Reveal Linq model LNQ 22 (model number [id number]) implanted in office by Dr Rayann Heman for afib management  ? LUMBAR LAMINECTOMY  40 yrs ago  ? "L3-4"  ? MENISCUS REPAIR Bilateral 2000 and 2010  ? REPLACEMENT TOTAL KNEE Left 2013  ? TEE WITHOUT CARDIOVERSION N/A 04/24/2019  ? Procedure: TRANSESOPHAGEAL ECHOCARDIOGRAM (TEE);  Surgeon: Acie Fredrickson Wonda Cheng, MD;  Location: [place];  Service: Cardiovascular;  Laterality: N/A;  ? TONSILECTOMY, ADENOIDECTOMY, BILATERAL MYRINGOTOMY AND TUBES  child  ? TOTAL ABDOMINAL HYSTERECTOMY  1995  ? had 2 tumors- benign  ? TUBAL LIGATION  88 years old  ? ? ?ROS- all systems are reviewed and negatives except as per HPI above ? ?Current Outpatient Medications  ?Medication Sig Dispense Refill  ? acetaminophen (TYLENOL) 325 MG tablet Take 325 mg by mouth daily as needed (pain.).    ? atorvastatin (LIPITOR) 20 MG tablet TAKE 1 TABLET BY MOUTH  DAILY 90 tablet 3  ? bumetanide (BUMEX) 1 MG tablet Take 1 tablet (1 mg total) by mouth daily. 90 tablet 1  ? Calcium Carb-Cholecalciferol (CALCIUM 600 + D PO) Take 1 tablet by mouth 2 (two) times daily.    ? Carboxymethylcellul-Glycerin (LUBRICATING EYE DROPS OP) Place 1 drop into both eyes in the morning and at bedtime.    ? clindamycin (CLEOCIN) 300 MG capsule As needed for dental work    ? diltiazem (CARDIZEM CD) 120 MG 24 hr capsule TAKE ONE (1) CAPSULE EACH DAY BY MOUTH 90 capsule 0  ? diltiazem (CARDIZEM) 30 MG tablet Take 1 Tablet Every 4 Hours As Needed For HR >100 and Top BP >100 45 tablet 1  ? dofetilide (TIKOSYN) 250 MCG capsule Take 1 capsule (250 mcg total) by mouth 2 (two) times daily. 60 capsule 1  ? ELIQUIS 5 MG TABS tablet TAKE ONE TABLET EVERY MORNING AND AT BEDTIME 180 tablet 1  ?  lisinopril (ZESTRIL) 10 MG tablet Take 1 tablet (10 mg total) by mouth daily. 90 tablet 1  ? loratadine (CLARITIN) 10 MG tablet Take 10 mg by mouth daily.     ? metoprolol succinate (TOPROL-XL) 50 MG 24 hr tablet Take 1 tablet (50 mg total) by mouth 2 (two) times daily. 60 tablet 3  ? Multiple Vitamins-Minerals (ONE-A-DAY WOMENS 50 PLUS PO) Take 1 tablet by mouth daily.    ? potassium chloride (KLOR-CON) 10 MEQ tablet TAKE 1 TABLET BY MOUTH IN  THE MORNING AND 2 TABLETS  BY MOUTH IN THE EVENING 270 tablet 3  ? Probiotic CAPS Take 1 capsule by  mouth daily.    ? psyllium (REGULOID) 0.52 g capsule Take 0.52 g by mouth 2 (two) times daily.     ? ?No current facility-administered medications for this encounter.  ? ? ?Physical Exam: ?There were no vitals filed for this visit. ? ? ?GEN- The patient is well appearing, alert and oriented x 3 today.   ?Head- normocephalic, atraumatic ?Eyes-  Sclera clear, conjunctiva pink ?Ears- hearing intact ?Oropharynx- clear ?Lungs- Clear to ausculation bilaterally, normal work of breathing ?Heart- tachycardic irregular rhythm ?GI- soft, NT, ND, + BS ?Extremities- no clubbing, cyanosis, or edema ? ?Wt Readings from Last 3 Encounters:  ?11/21/21 128.8 kg  ?10/23/21 128.7 kg  ?09/22/21 129.7 kg  ? ? ?EKG - aflutter with variable block at 99 bpm.qtc 413 ms  ? ?Recent echo is also reviewed ? ?Assessment and Plan: ? ?1. Persistent atrial fibrillation/flutter  ?S/p afib ablation 04/25/19  ?Failing tikosyn  ?Required CV right after ablation but since then has had progressive increase in afib burden,  will usually self convert, but the extra  CCB/metoprolol to convert will make her extra tired ?Cannot take amiodarone for ILD, followed by pulmonary (had used amiodarone in the remote past) ?Paceart has shown increase in afib burden over the last 3 months  ?Continue Tikosyn/diltaizem /metoprolol at current doses  ?She is pending consult with Dr. Quentin Ore 5/11 for any suggestions to   improve quality  of life  ? ?2.OSA ?She reports compliance with CPAP ? ?3. HTN ?Stable  ? ?4. CHA2DS2VASc  score of at least 6 ?Continue eliquis 5 mg bid  ? ?F/u with Dr. Quentin Ore 5/11 ? ?Geroge Baseman Kayleen Memos, ANP-C ?Afib Clinic ?[place]

## 2021-12-23 NOTE — Progress Notes (Signed)
Carelink Summary Report / Loop Recorder 

## 2021-12-29 ENCOUNTER — Other Ambulatory Visit: Payer: Self-pay | Admitting: Family Medicine

## 2022-01-02 DIAGNOSIS — Z20822 Contact with and (suspected) exposure to covid-19: Secondary | ICD-10-CM | POA: Diagnosis not present

## 2022-01-08 ENCOUNTER — Encounter: Payer: Self-pay | Admitting: Cardiology

## 2022-01-08 ENCOUNTER — Ambulatory Visit (INDEPENDENT_AMBULATORY_CARE_PROVIDER_SITE_OTHER): Payer: Medicare Other | Admitting: Cardiology

## 2022-01-08 ENCOUNTER — Encounter: Payer: Medicare Other | Admitting: Cardiology

## 2022-01-08 VITALS — BP 130/72 | HR 108 | Ht 63.0 in | Wt 277.0 lb

## 2022-01-08 DIAGNOSIS — I4892 Unspecified atrial flutter: Secondary | ICD-10-CM

## 2022-01-08 DIAGNOSIS — I5032 Chronic diastolic (congestive) heart failure: Secondary | ICD-10-CM | POA: Diagnosis not present

## 2022-01-08 DIAGNOSIS — I4819 Other persistent atrial fibrillation: Secondary | ICD-10-CM

## 2022-01-08 NOTE — Progress Notes (Signed)
?Electrophysiology Office Note:   ? ?Date:  01/08/2022  ? ?ID:  Tracy Miles, DOB 06/30/1934, MRN [id number] ? ?PCP:  [name], MD  ?[facility] Cardiologist:  None  ?[facility] Electrophysiologist:  Vickie Epley, MD  ? ?Referring MD: [name], MD  ? ?Chief Complaint: ILR MDT  ? ?History of Present Illness:   ? ?Tracy Miles is a 88 y.o. female who presents for follow-up of ILR MDT device. Their medical history includes persistent atrial fibrillation, aortic stenosis, CAD, DVT, hypertension, hyperlipidemia, anemia, vertigo, obesity, OSA on CPAP, and pancreatitis. Previously followed by Dr. Rayann Heman. ? ?She is s/p Afib ablation 04/25/2019. Required CV right after ablation, but has had progressive increase in her Afib burden. ? ?She last followed up with [name], NP on 12/17/2021 where she was feeling very fatigued. She believed that the higher doses of her medications to control v rates were contributing to her fatigue. At that visit she was in atrial flutter, reasonably controlled. It was noted that her Afib burden had increased over the 3 months prior, Tikosyn has become ineffective. Previously, she spoke with Dr. Rayann Heman who did not recommend a repeat ablation. She is unable to take amiodarone due to ILD followed by pulmonology. ? ?Overall, ? ?She denies any palpitations, chest pain, shortness of breath, or peripheral edema. No lightheadedness, headaches, syncope, orthopnea, or PND. ? ? ? ?  ?Past Medical History:  ?Diagnosis Date  ? Anemia   ? Aortic stenosis   ? Very mild  ? Benign paroxysmal positional vertigo 12/17/2013  ? Chicken pox as a child  ? Colon polyp   ? Coronary artery disease   ? DDD (degenerative disc disease) 11/16/2013  ? Hyperlipidemia   ? Hypertension   ? Iron deficiency anemia 11/13/2013  ? Mumps child and teenager  ? OA (osteoarthritis) 11/19/2013  ? S/p L TKR  ? Obesity   ? OSA on CPAP   ? Pancreatitis   ? post hysterectomy  ? Persistent atrial fibrillation  ([place])   ? Personal history of colonic polyps 02/25/2014  ? Personal history of DVT (deep vein thrombosis) X 2  ? "left"  ? Sleep apnea   ? Spinal stenosis   ? ? ?Past Surgical History:  ?Procedure Laterality Date  ? APPENDECTOMY    ? ATRIAL FIBRILLATION ABLATION N/A 04/25/2019  ? Procedure: ATRIAL FIBRILLATION ABLATION;  Surgeon: [name], MD;  Location: [place] CV [facility];  Service: Cardiovascular;  Laterality: N/A;  ? CARDIOVERSION N/A 08/09/2015  ? Procedure: CARDIOVERSION;  Surgeon: Jerline Pain, MD;  Location: [place];  Service: Cardiovascular;  Laterality: N/A;  ? CARDIOVERSION N/A 09/30/2018  ? Procedure: CARDIOVERSION;  Surgeon: [name], MD;  Location: [place];  Service: Cardiovascular;  Laterality: N/A;  ? CARDIOVERSION N/A 02/27/2019  ? Procedure: CARDIOVERSION;  Surgeon: Buford Dresser, MD;  Location: [place] ENDOSCOPY;  Service: Cardiovascular;  Laterality: N/A;  ? CARDIOVERSION N/A 06/07/2019  ? Procedure: CARDIOVERSION;  Surgeon: [name], MD;  Location: [facility] ENDOSCOPY;  Service: Cardiovascular;  Laterality: N/A;  ? CATARACT EXTRACTION W/ INTRAOCULAR LENS  IMPLANT, BILATERAL Bilateral 2016  ? CYST REMOVAL HAND Bilateral 1990's  ? "played to much golf"  ? DILATION AND CURETTAGE OF UTERUS    ? implantable loop recorder placement  11/20/2019  ? Medtronic Reveal Linq model LNQ 22 (model number [id number]) implanted in office by Dr Rayann Heman for afib management  ? LUMBAR LAMINECTOMY  40 yrs ago  ? "L3-4"  ? MENISCUS  REPAIR Bilateral 2000 and 2010  ? REPLACEMENT TOTAL KNEE Left 2013  ? TEE WITHOUT CARDIOVERSION N/A 04/24/2019  ? Procedure: TRANSESOPHAGEAL ECHOCARDIOGRAM (TEE);  Surgeon: Acie Fredrickson Wonda Cheng, MD;  Location: [place];  Service: Cardiovascular;  Laterality: N/A;  ? TONSILECTOMY, ADENOIDECTOMY, BILATERAL MYRINGOTOMY AND TUBES  child  ? TOTAL ABDOMINAL HYSTERECTOMY  1995  ? had 2 tumors- benign  ? TUBAL LIGATION  88 years old  ? ? ?Current Medications: ?Current Meds   ?Medication Sig  ? acetaminophen (TYLENOL) 325 MG tablet Take 325 mg by mouth daily as needed (pain.).  ? atorvastatin (LIPITOR) 20 MG tablet TAKE 1 TABLET BY MOUTH  DAILY  ? bumetanide (BUMEX) 1 MG tablet TAKE 1 TABLET BY MOUTH DAILY  ? Calcium Carb-Cholecalciferol (CALCIUM 600 + D PO) Take 1 tablet by mouth 2 (two) times daily.  ? Carboxymethylcellul-Glycerin (LUBRICATING EYE DROPS OP) Place 1 drop into both eyes in the morning and at bedtime.  ? clindamycin (CLEOCIN) 300 MG capsule As needed for dental work  ? diltiazem (CARDIZEM CD) 120 MG 24 hr capsule TAKE ONE (1) CAPSULE EACH DAY BY MOUTH  ? dofetilide (TIKOSYN) 250 MCG capsule Take 1 capsule (250 mcg total) by mouth 2 (two) times daily.  ? ELIQUIS 5 MG TABS tablet TAKE ONE TABLET EVERY MORNING AND AT BEDTIME  ? lisinopril (ZESTRIL) 10 MG tablet Take 1 tablet (10 mg total) by mouth daily.  ? loratadine (CLARITIN) 10 MG tablet Take 10 mg by mouth daily.   ? metoprolol succinate (TOPROL-XL) 50 MG 24 hr tablet Take 1 tablet (50 mg total) by mouth 2 (two) times daily.  ? Multiple Vitamins-Minerals (ONE-A-DAY WOMENS 50 PLUS PO) Take 1 tablet by mouth daily.  ? potassium chloride (KLOR-CON) 10 MEQ tablet TAKE 1 TABLET BY MOUTH IN  THE MORNING AND 2 TABLETS  BY MOUTH IN THE EVENING  ? Probiotic CAPS Take 1 capsule by mouth daily.  ? psyllium (REGULOID) 0.52 g capsule Take 0.52 g by mouth 2 (two) times daily.   ?  ? ?Allergies:   Gabapentin, Lactose intolerance (gi), Zebeta [bisoprolol fumarate], Penicillins, and Sulfa antibiotics  ? ?Social History  ? ?Socioeconomic History  ? Marital status: Widowed  ?  Spouse name: Not on file  ? Number of children: 3  ? Years of education: Not on file  ? Highest education level: Not on file  ?Occupational History  ? Occupation: retired Pharmacist, hospital  ?Tobacco Use  ? Smoking status: Never  ? Smokeless tobacco: Never  ? Tobacco comments:  ?  never used tobacco  ?Substance and Sexual Activity  ? Alcohol use: No  ? Drug use: No  ? Sexual  activity: Never  ?  Comment: [place], low sodium diet  ?Other Topics Concern  ? Not on file  ?Social History Narrative  ? Not on file  ? ?Social Determinants of Health  ? ?Financial Resource Strain: Low Risk   ? Difficulty of Paying Living Expenses: Not hard at all  ?Food Insecurity: No Food Insecurity  ? Worried About Charity fundraiser in the Last Year: Never true  ? Ran Out of Food in the Last Year: Never true  ?Transportation Needs: No Transportation Needs  ? Lack of Transportation (Medical): No  ? Lack of Transportation (Non-Medical): No  ?Physical Activity: Sufficiently Active  ? Days of Exercise per Week: 3 days  ? Minutes of Exercise per Session: 60 min  ?Stress: No Stress Concern Present  ? Feeling of Stress :  Not at all  ?Social Connections: Moderately Integrated  ? Frequency of Communication with Friends and Family: More than three times a week  ? Frequency of Social Gatherings with Friends and Family: More than three times a week  ? Attends Religious Services: More than 4 times per year  ? Active Member of Clubs or Organizations: Yes  ? Attends Archivist Meetings: 1 to 4 times per year  ? Marital Status: Widowed  ?  ? ?Family History: ?The patient's family history includes Atrial fibrillation in her son; COPD in her father; Cancer in her maternal grandfather and paternal grandmother; Cancer (age of onset: 20) in her son; Cancer (age of onset: 48) in her father; Dementia in her mother; Diabetes in her maternal grandmother; Gout in her son and son; Heart attack (age of onset: 38) in her mother; Heart disease in her brother; Hyperlipidemia in her brother, mother, son, and son; Hypertension in her brother; Pernicious anemia in her maternal grandmother and mother; Sleep apnea in her son; Stroke in her paternal grandfather. There is no history of Colon cancer, Pancreatic cancer, Stomach cancer, Throat cancer, or Liver disease. ? ?ROS:   ?Please see the history of present illness.     ? ?All other systems reviewed and are negative. ? ?EKGs/Labs/Other Studies Reviewed:   ? ?The following studies were reviewed today: ? ?loop interrogation personally reviewed: ?Increasing AF burden over pas

## 2022-01-08 NOTE — Patient Instructions (Addendum)
Medication Instructions:  ?Your physician recommends that you continue on your current medications as directed. Please refer to the Current Medication list given to you today. ?*If you need a refill on your cardiac medications before your next appointment, please call your pharmacy* ? ?Lab Work: ?None. ?If you have labs (blood work) drawn today and your tests are completely normal, you will receive your results only by: ?MyChart Message (if you have MyChart) OR ?A paper copy in the mail ?If you have any lab test that is abnormal or we need to change your treatment, we will call you to review the results. ? ?Testing/Procedures: ?Your physician has requested that you have an echocardiogram. Echocardiography is a painless test that uses sound waves to create images of your heart. It provides your doctor with information about the size and shape of your heart and how well your heart?s chambers and valves are working. This procedure takes approximately one hour. There are no restrictions for this procedure. ? ?Follow-Up: ?At [facility], you and your health needs are our priority.  As part of our continuing mission to provide you with exceptional heart care, we have created designated Provider Care Teams.  These Care Teams include your primary Cardiologist (physician) and Advanced Practice Providers (APPs -  Physician Assistants and Nurse Practitioners) who all work together to provide you with the care you need, when you need it. ? ?Your physician wants you to follow-up in: please call Otila Kluver RN if you wish to move forward with Pacemaker and AV node ablation.  ? ?We recommend signing up for the patient portal called "MyChart".  Sign up information is provided on this After Visit Summary.  MyChart is used to connect with patients for Virtual Visits (Telemedicine).  Patients are able to view lab/test results, encounter notes, upcoming appointments, etc.  Non-urgent messages can be sent to your provider as well.   ?To  learn more about what you can do with MyChart, go to [email or web address].   ? ?Any Other Special Instructions Will Be Listed Below (If Applicable). ? ? ? ? ?  ? ? ?

## 2022-01-08 NOTE — H&P (View-Only) (Signed)
Electrophysiology Office Note:    Date:  01/08/2022   ID:  [name], DOB March 15, 1934, MRN [id number]  PCP:  [name], MD  [facility] Cardiologist:  None  [facility] Electrophysiologist:  Vickie Epley, MD   Referring MD: [name], MD   Chief Complaint: ILR MDT   History of Present Illness:    Tracy Miles is a 88 y.o. female who presents for follow-up of ILR MDT device. Their medical history includes persistent atrial fibrillation, aortic stenosis, CAD, DVT, hypertension, hyperlipidemia, anemia, vertigo, obesity, OSA on CPAP, and pancreatitis. Previously followed by Dr. Rayann Heman.  She is s/p Afib ablation 04/25/2019. Required CV right after ablation, but has had progressive increase in her Afib burden.  She last followed up with [name], NP on 12/17/2021 where she was feeling very fatigued. She believed that the higher doses of her medications to control v rates were contributing to her fatigue. At that visit she was in atrial flutter, reasonably controlled. It was noted that her Afib burden had increased over the 3 months prior, Tikosyn has become ineffective. Previously, she spoke with Dr. Rayann Heman who did not recommend a repeat ablation. She is unable to take amiodarone due to ILD followed by pulmonology.  Overall,  She denies any palpitations, chest pain, shortness of breath, or peripheral edema. No lightheadedness, headaches, syncope, orthopnea, or PND.      Past Medical History:  Diagnosis Date   Anemia    Aortic stenosis    Very mild   Benign paroxysmal positional vertigo 12/17/2013   Chicken pox as a child   Colon polyp    Coronary artery disease    DDD (degenerative disc disease) 11/16/2013   Hyperlipidemia    Hypertension    Iron deficiency anemia 11/13/2013   Mumps child and teenager   OA (osteoarthritis) 11/19/2013   S/p L TKR   Obesity    OSA on CPAP    Pancreatitis    post hysterectomy   Persistent atrial fibrillation  (HCC)    Personal history of colonic polyps 02/25/2014   Personal history of DVT (deep vein thrombosis) X 2   "left"   Sleep apnea    Spinal stenosis     Past Surgical History:  Procedure Laterality Date   APPENDECTOMY     ATRIAL FIBRILLATION ABLATION N/A 04/25/2019   Procedure: ATRIAL FIBRILLATION ABLATION;  Surgeon: [name], MD;  Location: [place] [facility];  Service: Cardiovascular;  Laterality: N/A;   CARDIOVERSION N/A 08/09/2015   Procedure: CARDIOVERSION;  Surgeon: Jerline Pain, MD;  Location: [place];  Service: Cardiovascular;  Laterality: N/A;   CARDIOVERSION N/A 09/30/2018   Procedure: CARDIOVERSION;  Surgeon: [name], MD;  Location: [facility] ENDOSCOPY;  Service: Cardiovascular;  Laterality: N/A;   CARDIOVERSION N/A 02/27/2019   Procedure: CARDIOVERSION;  Surgeon: Buford Dresser, MD;  Location: [place];  Service: Cardiovascular;  Laterality: N/A;   CARDIOVERSION N/A 06/07/2019   Procedure: CARDIOVERSION;  Surgeon: [name], MD;  Location: [place];  Service: Cardiovascular;  Laterality: N/A;   CATARACT EXTRACTION W/ INTRAOCULAR LENS  IMPLANT, BILATERAL Bilateral 2016   CYST REMOVAL HAND Bilateral 1990's   "played to much golf"   DILATION AND CURETTAGE OF UTERUS     implantable loop recorder placement  11/20/2019   Medtronic Reveal Linq model GYF 74 (model number [id number]) implanted in office by Dr Rayann Heman for afib management   LUMBAR LAMINECTOMY  40 yrs ago   "L3-4"   MENISCUS  REPAIR Bilateral 2000 and 2010   REPLACEMENT TOTAL KNEE Left 2013   TEE WITHOUT CARDIOVERSION N/A 04/24/2019   Procedure: TRANSESOPHAGEAL ECHOCARDIOGRAM (TEE);  Surgeon: Thayer Headings, MD;  Location: [facility] ENDOSCOPY;  Service: Cardiovascular;  Laterality: N/A;   TONSILECTOMY, ADENOIDECTOMY, BILATERAL MYRINGOTOMY AND TUBES  child   TOTAL ABDOMINAL HYSTERECTOMY  1995   had 2 tumors- benign   TUBAL LIGATION  88 years old    Current Medications: Current Meds   Medication Sig   acetaminophen (TYLENOL) 325 MG tablet Take 325 mg by mouth daily as needed (pain.).   atorvastatin (LIPITOR) 20 MG tablet TAKE 1 TABLET BY MOUTH  DAILY   bumetanide (BUMEX) 1 MG tablet TAKE 1 TABLET BY MOUTH DAILY   Calcium Carb-Cholecalciferol (CALCIUM 600 + D PO) Take 1 tablet by mouth 2 (two) times daily.   Carboxymethylcellul-Glycerin (LUBRICATING EYE DROPS OP) Place 1 drop into both eyes in the morning and at bedtime.   clindamycin (CLEOCIN) 300 MG capsule As needed for dental work   diltiazem (CARDIZEM CD) 120 MG 24 hr capsule TAKE ONE (1) CAPSULE EACH DAY BY MOUTH   dofetilide (TIKOSYN) 250 MCG capsule Take 1 capsule (250 mcg total) by mouth 2 (two) times daily.   ELIQUIS 5 MG TABS tablet TAKE ONE TABLET EVERY MORNING AND AT BEDTIME   lisinopril (ZESTRIL) 10 MG tablet Take 1 tablet (10 mg total) by mouth daily.   loratadine (CLARITIN) 10 MG tablet Take 10 mg by mouth daily.    metoprolol succinate (TOPROL-XL) 50 MG 24 hr tablet Take 1 tablet (50 mg total) by mouth 2 (two) times daily.   Multiple Vitamins-Minerals (ONE-A-DAY WOMENS 50 PLUS PO) Take 1 tablet by mouth daily.   potassium chloride (KLOR-CON) 10 MEQ tablet TAKE 1 TABLET BY MOUTH IN  THE MORNING AND 2 TABLETS  BY MOUTH IN THE EVENING   Probiotic CAPS Take 1 capsule by mouth daily.   psyllium (REGULOID) 0.52 g capsule Take 0.52 g by mouth 2 (two) times daily.      Allergies:   Gabapentin, Lactose intolerance (gi), Zebeta [bisoprolol fumarate], Penicillins, and Sulfa antibiotics   Social History   Socioeconomic History   Marital status: Widowed    Spouse name: Not on file   Number of children: 3   Years of education: Not on file   Highest education level: Not on file  Occupational History   Occupation: retired Pharmacist, hospital  Tobacco Use   Smoking status: Never   Smokeless tobacco: Never   Tobacco comments:    never used tobacco  Substance and Sexual Activity   Alcohol use: No   Drug use: No   Sexual  activity: Never    Comment: [place], low sodium diet  Other Topics Concern   Not on file  Social History Narrative   Not on file   Social Determinants of Health   Financial Resource Strain: Low Risk    Difficulty of Paying Living Expenses: Not hard at all  Food Insecurity: No Food Insecurity   Worried About Charity fundraiser in the Last Year: Never true   Arboriculturist in the Last Year: Never true  Transportation Needs: No Transportation Needs   Lack of Transportation (Medical): No   Lack of Transportation (Non-Medical): No  Physical Activity: Sufficiently Active   Days of Exercise per Week: 3 days   Minutes of Exercise per Session: 60 min  Stress: No Stress Concern Present   Feeling of Stress :  Not at all  Social Connections: Moderately Integrated   Frequency of Communication with Friends and Family: More than three times a week   Frequency of Social Gatherings with Friends and Family: More than three times a week   Attends Religious Services: More than 4 times per year   Active Member of [employer] or Organizations: Yes   Attends Archivist Meetings: 1 to 4 times per year   Marital Status: Widowed     Family History: The patient's family history includes Atrial fibrillation in her son; COPD in her father; Cancer in her maternal grandfather and paternal grandmother; Cancer (age of onset: 41) in her son; Cancer (age of onset: 23) in her father; Dementia in her mother; Diabetes in her maternal grandmother; Gout in her son and son; Heart attack (age of onset: 50) in her mother; Heart disease in her brother; Hyperlipidemia in her brother, mother, son, and son; Hypertension in her brother; Pernicious anemia in her maternal grandmother and mother; Sleep apnea in her son; Stroke in her paternal grandfather. There is no history of Colon cancer, Pancreatic cancer, Stomach cancer, Throat cancer, or Liver disease.  ROS:   Please see the history of present illness.      All other systems reviewed and are negative.  EKGs/Labs/Other Studies Reviewed:    The following studies were reviewed today:  loop interrogation personally reviewed: Increasing AF burden over past few months    EKG:   EKG is personally reviewed.     Recent Labs: 09/22/2021: Hemoglobin 12.8; Platelets 242.0; TSH 2.67 10/23/2021: ALT 13; BUN 17; Creatinine, Ser 1.08; Magnesium 2.1; Potassium 4.0; Sodium 139   Recent Lipid Panel    Component Value Date/Time   CHOL 153 09/22/2021 1448   CHOL 138 04/20/2019 1345   TRIG 165.0 (H) 09/22/2021 1448   HDL 41.80 09/22/2021 1448   HDL 41 04/20/2019 1345   CHOLHDL 4 09/22/2021 1448   VLDL 33.0 09/22/2021 1448   LDLCALC 78 09/22/2021 1448   LDLCALC 85 05/14/2020 1413    Physical Exam:    VS:  BP 130/72   Pulse (!) 108   Ht '5\' 3"'$  (1.6 m)   Wt 277 lb (125.6 kg)   SpO2 97%   BMI 49.07 kg/m     Wt Readings from Last 3 Encounters:  01/08/22 277 lb (125.6 kg)  12/17/21 278 lb 3.2 oz (126.2 kg)  11/21/21 284 lb (128.8 kg)     GEN: Well nourished, well developed in no acute distress. obese HEENT: Normal NECK: No JVD; No carotid bruits LYMPHATICS: No lymphadenopathy CARDIAC: irregularly irregular, no murmurs, rubs, gallops;  RESPIRATORY:  Clear to auscultation without rales, wheezing or rhonchi  ABDOMEN: Soft, non-tender, non-distended MUSCULOSKELETAL:  No edema; No deformity  SKIN: Warm and dry NEUROLOGIC:  Alert and oriented x 3 PSYCHIATRIC:  Normal affect       ASSESSMENT:    1. Persistent atrial fibrillation ([place])   2. Atrial flutter, unspecified type ([place])   3. Chronic diastolic HF (heart failure) ([place])   4. Morbid obesity ([place])    PLAN:    In order of problems listed above:   #Persistent Atrial Fibrillation #Persistent Atrial Flutter  Patient has symptomatic persistent atrial fibrillation despite treatment with dofetilide. Her burden of AF has increased significantly by loop recorder tracings. I  suspect she is approaching permanent atrial fibrillation.  We discussed treatment strategies at length during today's clinic appointment. We discussed catheter ablation but this is not going to be the  best option based on age/comorbidities. We discussed antiarrhythmic drugs. Unfortunately she has failed dofetilide and is not a candidate for amiodarone given lung disease. We discussed implant of permanent pacemaker and AV junction ablation. In my opinion, this would offer a reasonable chance of relieving many of her symptoms and help her reduce the number of medications she takes.  I discussed both the pacemaker implant and AVJ ablation procedure in detail during today's visit including the risks and recovery. She would like to discuss the procedures with her children before making a final decision which I think is very reasonable. She will reach out if she would like to move forward.   If she elects to proceed, would need to hold her Eliquis for 3 days.   Risks, benefits, alternatives to PPM implantation were discussed in detail with the patient today. The patient understands that the risks include but are not limited to bleeding, infection, pneumothorax, perforation, tamponade, vascular damage, renal failure, MI, stroke, death, and lead dislodgement.  Risk, benefits, and alternatives to ablation of the av node were also discussed in detail today. These risks include but are not limited to stroke, bleeding, vascular damage, tamponade, perforation, worsening renal function, and death. Carto is requested for the procedure.      Total time spent with patient today 45 minutes. This includes reviewing records, evaluating the patient and coordinating care.  Medication Adjustments/Labs and Tests Ordered: Current medicines are reviewed at length with the patient today.  Concerns regarding medicines are outlined above.  Orders Placed This Encounter  Procedures   EKG 12-Lead   ECHOCARDIOGRAM COMPLETE   No  orders of the defined types were placed in this encounter.   I,Mathew Stumpf,acting as a Education administrator for Vickie Epley, MD.,have documented all relevant documentation on the behalf of Vickie Epley, MD,as directed by  Vickie Epley, MD while in the presence of Vickie Epley, MD.  I, Vickie Epley, MD, have reviewed all documentation for this visit. The documentation on 01/08/22 for the exam, diagnosis, procedures, and orders are all accurate and complete.   Signed, Hilton Cork. Quentin Ore, MD, [facility], [facility] 01/08/2022 6:16 PM    Electrophysiology [facility]

## 2022-01-09 ENCOUNTER — Telehealth: Payer: Self-pay | Admitting: *Deleted

## 2022-01-09 ENCOUNTER — Encounter: Payer: Self-pay | Admitting: *Deleted

## 2022-01-09 DIAGNOSIS — Z01818 Encounter for other preprocedural examination: Secondary | ICD-10-CM

## 2022-01-09 DIAGNOSIS — I4819 Other persistent atrial fibrillation: Secondary | ICD-10-CM

## 2022-01-09 NOTE — Telephone Encounter (Signed)
Both procedure workups completed. Printed out instructions and lab orders. Patient will pick up at front desk along with scrub. Went over all instructions and answered questions.  ?Patient verbalized understanding and agreement.  ?

## 2022-01-09 NOTE — Telephone Encounter (Addendum)
Picked PPM date and Av node ablation date along with two lab dates.

## 2022-01-12 ENCOUNTER — Ambulatory Visit (INDEPENDENT_AMBULATORY_CARE_PROVIDER_SITE_OTHER): Payer: Medicare Other

## 2022-01-12 ENCOUNTER — Ambulatory Visit (HOSPITAL_BASED_OUTPATIENT_CLINIC_OR_DEPARTMENT_OTHER)
Admission: RE | Admit: 2022-01-12 | Discharge: 2022-01-12 | Disposition: A | Discharge status: Home / Self Care | Payer: Medicare Other | Source: Ambulatory Visit | Attending: Cardiology | Admitting: Cardiology

## 2022-01-12 DIAGNOSIS — I4819 Other persistent atrial fibrillation: Secondary | ICD-10-CM

## 2022-01-12 LAB — CUP PACEART REMOTE DEVICE CHECK
Date Time Interrogation Session: 20230511143200
Implantable Pulse Generator Implant Date: 20210322

## 2022-01-12 LAB — ECHOCARDIOGRAM COMPLETE
AR max vel: 1.94 cm2
AV Area VTI: 1.59 cm2
AV Area mean vel: 1.76 cm2
AV Mean grad: 6 mmHg
AV Peak grad: 10.8 mmHg
Ao pk vel: 1.64 m/s
S' Lateral: 3.3 cm

## 2022-01-12 NOTE — Progress Notes (Signed)
?  Echocardiogram ?2D Echocardiogram has been performed. ? ?Tracy Miles ?01/12/2022, 3:54 PM ?

## 2022-01-20 ENCOUNTER — Encounter: Payer: Self-pay | Admitting: Family Medicine

## 2022-01-20 ENCOUNTER — Ambulatory Visit (INDEPENDENT_AMBULATORY_CARE_PROVIDER_SITE_OTHER): Payer: Medicare Other | Admitting: Family Medicine

## 2022-01-20 ENCOUNTER — Ambulatory Visit: Payer: Medicare Other | Attending: Internal Medicine

## 2022-01-20 VITALS — BP 128/78 | HR 98 | Resp 20 | Ht 63.0 in | Wt 278.0 lb

## 2022-01-20 DIAGNOSIS — I5032 Chronic diastolic (congestive) heart failure: Secondary | ICD-10-CM

## 2022-01-20 DIAGNOSIS — I4819 Other persistent atrial fibrillation: Secondary | ICD-10-CM

## 2022-01-20 DIAGNOSIS — E782 Mixed hyperlipidemia: Secondary | ICD-10-CM

## 2022-01-20 DIAGNOSIS — Z23 Encounter for immunization: Secondary | ICD-10-CM

## 2022-01-20 DIAGNOSIS — R739 Hyperglycemia, unspecified: Secondary | ICD-10-CM | POA: Diagnosis not present

## 2022-01-20 DIAGNOSIS — R3915 Urgency of urination: Secondary | ICD-10-CM | POA: Diagnosis not present

## 2022-01-20 DIAGNOSIS — I10 Essential (primary) hypertension: Secondary | ICD-10-CM | POA: Diagnosis not present

## 2022-01-20 NOTE — Assessment & Plan Note (Addendum)
Medications are no longer working, she has been set up for a pace maker placement in July 2023 due to 2 months of persistent to afib and fatigue.

## 2022-01-20 NOTE — Assessment & Plan Note (Signed)
Well controlled, no changes to meds. Encouraged heart healthy diet such as the DASH diet and exercise as tolerated.  °

## 2022-01-20 NOTE — Progress Notes (Signed)
Subjective:    Patient ID: Tracy Miles, female    DOB: 1934/01/04, 88 y.o.   MRN: [id number]  Chief Complaint  Patient presents with  . Follow-up    HPI Patient is in today for a follow up.  Past Medical History:  Diagnosis Date  . Anemia   . Aortic stenosis    Very mild  . Benign paroxysmal positional vertigo 12/17/2013  . Chicken pox as a child  . Colon polyp   . Coronary artery disease   . DDD (degenerative disc disease) 11/16/2013  . Hyperlipidemia   . Hypertension   . Iron deficiency anemia 11/13/2013  . Mumps child and teenager  . OA (osteoarthritis) 11/19/2013   S/p L TKR  . Obesity   . OSA on CPAP   . Pancreatitis    post hysterectomy  . Persistent atrial fibrillation ([place])   . Personal history of colonic polyps 02/25/2014  . Personal history of DVT (deep vein thrombosis) X 2   "left"  . Sleep apnea   . Spinal stenosis     Past Surgical History:  Procedure Laterality Date  . APPENDECTOMY    . ATRIAL FIBRILLATION ABLATION N/A 04/25/2019   Procedure: ATRIAL FIBRILLATION ABLATION;  Surgeon: [name], MD;  Location: [facility];  Service: Cardiovascular;  Laterality: N/A;  . CARDIOVERSION N/A 08/09/2015   Procedure: CARDIOVERSION;  Surgeon: Jerline Pain, MD;  Location: [place];  Service: Cardiovascular;  Laterality: N/A;  . CARDIOVERSION N/A 09/30/2018   Procedure: CARDIOVERSION;  Surgeon: [name], MD;  Location: [facility] ENDOSCOPY;  Service: Cardiovascular;  Laterality: N/A;  . CARDIOVERSION N/A 02/27/2019   Procedure: CARDIOVERSION;  Surgeon: Buford Dresser, MD;  Location: [facility]OSCOPY;  Service: Cardiovascular;  Laterality: N/A;  . CARDIOVERSION N/A 06/07/2019   Procedure: CARDIOVERSION;  Surgeon: [name], MD;  Location: [place];  Service: Cardiovascular;  Laterality: N/A;  . CATARACT EXTRACTION W/ INTRAOCULAR LENS  IMPLANT, BILATERAL Bilateral 2016  . CYST REMOVAL HAND Bilateral 1990's   "played to much golf"   . DILATION AND CURETTAGE OF UTERUS    . implantable loop recorder placement  11/20/2019   Medtronic Reveal Linq model LNQ 22 (model number [id number]) implanted in office by Dr Rayann Heman for afib management  . LUMBAR LAMINECTOMY  40 yrs ago   "L3-4"  . MENISCUS REPAIR Bilateral 2000 and 2010  . REPLACEMENT TOTAL KNEE Left 2013  . TEE WITHOUT CARDIOVERSION N/A 04/24/2019   Procedure: TRANSESOPHAGEAL ECHOCARDIOGRAM (TEE);  Surgeon: Acie Fredrickson Wonda Cheng, MD;  Location: [place];  Service: Cardiovascular;  Laterality: N/A;  . TONSILECTOMY, ADENOIDECTOMY, BILATERAL MYRINGOTOMY AND TUBES  child  . TOTAL ABDOMINAL HYSTERECTOMY  1995   had 2 tumors- benign  . TUBAL LIGATION  88 years old    Family History  Problem Relation Age of Onset  . Heart attack Mother 74  . Hyperlipidemia Mother        ?  Marland Kitchen Dementia Mother   . Pernicious anemia Mother   . COPD Father        smoker  . Cancer Father 63       prostate  . Heart disease Brother        quadruple bipass surgery  . Hyperlipidemia Brother   . Hypertension Brother   . Diabetes Maternal Grandmother        type 2  . Pernicious anemia Maternal Grandmother   . Gout Son   . Atrial fibrillation Son   . Hyperlipidemia Son   .  Cancer Maternal Grandfather        liver  . Cancer Paternal Grandmother        lung- doesn't think she smokes?  . Stroke Paternal Grandfather   . Cancer Son 50       non hodgin's lymphoma  . Gout Son   . Hyperlipidemia Son   . Sleep apnea Son   . Colon cancer Neg Hx   . Pancreatic cancer Neg Hx   . Stomach cancer Neg Hx   . Throat cancer Neg Hx   . Liver disease Neg Hx     Social History   Socioeconomic History  . Marital status: Widowed    Spouse name: Not on file  . Number of children: 3  . Years of education: Not on file  . Highest education level: Not on file  Occupational History  . Occupation: retired Pharmacist, hospital  Tobacco Use  . Smoking status: Never  . Smokeless tobacco: Never  . Tobacco  comments:    never used tobacco  Substance and Sexual Activity  . Alcohol use: No  . Drug use: No  . Sexual activity: Never    Comment: lives at [place], low sodium diet  Other Topics Concern  . Not on file  Social History Narrative  . Not on file   Social Determinants of Health   Financial Resource Strain: Low Risk   . Difficulty of Paying Living Expenses: Not hard at all  Food Insecurity: No Food Insecurity  . Worried About Charity fundraiser in the Last Year: Never true  . Ran Out of Food in the Last Year: Never true  Transportation Needs: No Transportation Needs  . Lack of Transportation (Medical): No  . Lack of Transportation (Non-Medical): No  Physical Activity: Sufficiently Active  . Days of Exercise per Week: 3 days  . Minutes of Exercise per Session: 60 min  Stress: No Stress Concern Present  . Feeling of Stress : Not at all  Social Connections: Moderately Integrated  . Frequency of Communication with Friends and Family: More than three times a week  . Frequency of Social Gatherings with Friends and Family: More than three times a week  . Attends Religious Services: More than 4 times per year  . Active Member of Clubs or Organizations: Yes  . Attends Archivist Meetings: 1 to 4 times per year  . Marital Status: Widowed  Intimate Partner Violence: Not At Risk  . Fear of Current or Ex-Partner: No  . Emotionally Abused: No  . Physically Abused: No  . Sexually Abused: No    Outpatient Medications Prior to Visit  Medication Sig Dispense Refill  . acetaminophen (TYLENOL) 325 MG tablet Take 325 mg by mouth daily as needed (pain.).    Marland Kitchen atorvastatin (LIPITOR) 20 MG tablet TAKE 1 TABLET BY MOUTH  DAILY 90 tablet 3  . bumetanide (BUMEX) 1 MG tablet TAKE 1 TABLET BY MOUTH DAILY 90 tablet 1  . Calcium Carb-Cholecalciferol (CALCIUM 600 + D PO) Take 1 tablet by mouth 2 (two) times daily.    . Carboxymethylcellul-Glycerin (LUBRICATING EYE DROPS OP) Place 1  drop into both eyes in the morning and at bedtime.    . clindamycin (CLEOCIN) 300 MG capsule As needed for dental work    . diltiazem (CARDIZEM) 30 MG tablet Take 1 Tablet Every 4 Hours As Needed For HR >100 and Top BP >100 45 tablet 1  . dofetilide (TIKOSYN) 250 MCG capsule Take 1 capsule (250 mcg  total) by mouth 2 (two) times daily. 60 capsule 1  . ELIQUIS 5 MG TABS tablet TAKE ONE TABLET EVERY MORNING AND AT BEDTIME 180 tablet 1  . lisinopril (ZESTRIL) 10 MG tablet Take 1 tablet (10 mg total) by mouth daily. 90 tablet 1  . loratadine (CLARITIN) 10 MG tablet Take 10 mg by mouth daily.     . metoprolol succinate (TOPROL-XL) 50 MG 24 hr tablet Take 1 tablet (50 mg total) by mouth 2 (two) times daily. 60 tablet 3  . Multiple Vitamins-Minerals (ONE-A-DAY WOMENS 50 PLUS PO) Take 1 tablet by mouth daily.    . potassium chloride (KLOR-CON) 10 MEQ tablet TAKE 1 TABLET BY MOUTH IN  THE MORNING AND 2 TABLETS  BY MOUTH IN THE EVENING 270 tablet 3  . Probiotic CAPS Take 1 capsule by mouth daily.    . psyllium (REGULOID) 0.52 g capsule Take 0.52 g by mouth 2 (two) times daily.     Marland Kitchen diltiazem (CARDIZEM CD) 120 MG 24 hr capsule TAKE ONE (1) CAPSULE EACH DAY BY MOUTH (Patient not taking: Reported on 01/20/2022) 90 capsule 0   No facility-administered medications prior to visit.    Allergies  Allergen Reactions  . Gabapentin Swelling  . Lactose Intolerance (Gi) Other (See Comments)    Bothers her stomach  . Zebeta [Bisoprolol Fumarate] Nausea Only  . Penicillins Hives and Rash    Did it involve swelling of the face/tongue/throat, SOB, or low BP? No Did it involve sudden or severe rash/hives, skin peeling, or any reaction on the inside of your mouth or nose? Yes Did you need to seek medical attention at a hospital or doctor's office? No When did it last happen?       25+ years If all above answers are "NO", may proceed with cephalosporin use.   . Sulfa Antibiotics Rash    Review of Systems   Constitutional:  Positive for malaise/fatigue. Negative for fever.  HENT:  Negative for congestion.   Eyes:  Negative for blurred vision.  Respiratory:  Positive for shortness of breath.   Cardiovascular:  Positive for palpitations. Negative for chest pain and leg swelling.  Gastrointestinal:  Negative for abdominal pain, blood in stool and nausea.  Genitourinary:  Negative for dysuria and frequency.  Musculoskeletal:  Negative for falls.  Skin:  Negative for rash.  Neurological:  Negative for dizziness, loss of consciousness and headaches.  Endo/Heme/Allergies:  Negative for environmental allergies.  Psychiatric/Behavioral:  Negative for depression. The patient is not nervous/anxious.       Objective:    Physical Exam Constitutional:      General: She is not in acute distress.    Appearance: She is well-developed.  HENT:     Head: Normocephalic and atraumatic.  Eyes:     Conjunctiva/sclera: Conjunctivae normal.  Neck:     Thyroid: No thyromegaly.  Cardiovascular:     Rate and Rhythm: Normal rate. Rhythm irregular.     Heart sounds: Normal heart sounds. No murmur heard. Pulmonary:     Effort: Pulmonary effort is normal. No respiratory distress.     Breath sounds: Normal breath sounds.  Abdominal:     General: Bowel sounds are normal. There is no distension.     Palpations: Abdomen is soft. There is no mass.     Tenderness: There is no abdominal tenderness.  Musculoskeletal:     Cervical back: Neck supple.  Lymphadenopathy:     Cervical: No cervical adenopathy.  Skin:  General: Skin is warm and dry.  Neurological:     Mental Status: She is alert and oriented to person, place, and time.  Psychiatric:        Behavior: Behavior normal.    BP 128/78 (BP Location: Left Arm, Patient Position: Sitting, Cuff Size: Normal)   Pulse 98   Resp 20   Ht '5\' 3"'$  (1.6 m)   Wt 278 lb (126.1 kg)   SpO2 98%   BMI 49.25 kg/m  Wt Readings from Last 3 Encounters:  01/20/22 278 lb  (126.1 kg)  01/08/22 277 lb (125.6 kg)  12/17/21 278 lb 3.2 oz (126.2 kg)    Diabetic Foot Exam - Simple   No data filed    Lab Results  Component Value Date   WBC 5.9 09/22/2021   HGB 12.8 09/22/2021   HCT 38.6 09/22/2021   PLT 242.0 09/22/2021   GLUCOSE 114 (H) 10/23/2021   CHOL 153 09/22/2021   TRIG 165.0 (H) 09/22/2021   HDL 41.80 09/22/2021   LDLCALC 78 09/22/2021   ALT 13 10/23/2021   AST 18 10/23/2021   NA 139 10/23/2021   K 4.0 10/23/2021   CL 103 10/23/2021   CREATININE 1.08 (H) 10/23/2021   BUN 17 10/23/2021   CO2 28 10/23/2021   TSH 2.67 09/22/2021   INR 1.55 (H) 08/06/2015   HGBA1C 5.8 09/22/2021    Lab Results  Component Value Date   TSH 2.67 09/22/2021   Lab Results  Component Value Date   WBC 5.9 09/22/2021   HGB 12.8 09/22/2021   HCT 38.6 09/22/2021   MCV 95.1 09/22/2021   PLT 242.0 09/22/2021   Lab Results  Component Value Date   NA 139 10/23/2021   K 4.0 10/23/2021   CO2 28 10/23/2021   GLUCOSE 114 (H) 10/23/2021   BUN 17 10/23/2021   CREATININE 1.08 (H) 10/23/2021   BILITOT 0.9 10/23/2021   ALKPHOS 72 10/23/2021   AST 18 10/23/2021   ALT 13 10/23/2021   PROT 6.7 10/23/2021   ALBUMIN 3.9 10/23/2021   CALCIUM 9.4 10/23/2021   ANIONGAP 8 10/23/2021   GFR 54.65 (L) 09/22/2021   Lab Results  Component Value Date   CHOL 153 09/22/2021   Lab Results  Component Value Date   HDL 41.80 09/22/2021   Lab Results  Component Value Date   LDLCALC 78 09/22/2021   Lab Results  Component Value Date   TRIG 165.0 (H) 09/22/2021   Lab Results  Component Value Date   CHOLHDL 4 09/22/2021   Lab Results  Component Value Date   HGBA1C 5.8 09/22/2021       Assessment & Plan:   Problem List Items Addressed This Visit     Essential hypertension    Well controlled, no changes to meds. Encouraged heart healthy diet such as the DASH diet and exercise as tolerated.        Relevant Orders   CBC with Differential/Platelet    Comprehensive metabolic panel   TSH   Hyperlipidemia, mixed    Tolerating statin, encouraged heart healthy diet, avoid trans fats, minimize simple carbs and saturated fats. Increase exercise as tolerated       Relevant Orders   Lipid panel   Chronic diastolic HF (heart failure) (HCC)    No recent exacerbation. No changes       Hyperglycemia    hgba1c acceptable, minimize simple carbs. Increase exercise as tolerated.        Relevant Orders  Hemoglobin A1c   Urinary urgency - Primary   Relevant Orders   Urinalysis   Urine Culture   Persistent atrial fibrillation ([place])    Medications are no longer working, she has been set up for a pace maker placement in July 2023 due to 2 months of persistent to afib and fatigue.        Relevant Orders   CBC with Differential/Platelet    I am having Tracy Miles "Tracy Miles" maintain her loratadine, psyllium, acetaminophen, Calcium Carb-Cholecalciferol (CALCIUM 600 + D PO), Probiotic, Carboxymethylcellul-Glycerin (LUBRICATING EYE DROPS OP), Multiple Vitamins-Minerals (ONE-A-DAY WOMENS 50 PLUS PO), potassium chloride, lisinopril, atorvastatin, diltiazem, clindamycin, metoprolol succinate, Eliquis, diltiazem, dofetilide, and bumetanide.  No orders of the defined types were placed in this encounter.    [name], MD

## 2022-01-20 NOTE — Assessment & Plan Note (Signed)
No recent exacerbation. No changes 

## 2022-01-20 NOTE — Assessment & Plan Note (Signed)
Tolerating statin, encouraged heart healthy diet, avoid trans fats, minimize simple carbs and saturated fats. Increase exercise as tolerated 

## 2022-01-20 NOTE — Assessment & Plan Note (Signed)
hgba1c acceptable, minimize simple carbs. Increase exercise as tolerated.  

## 2022-01-20 NOTE — Patient Instructions (Signed)
Atrial Fibrillation  Atrial fibrillation is a type of irregular or rapid heartbeat (arrhythmia). In atrial fibrillation, the top part of the heart (atria) beats in an irregular pattern. This makes the heart unable to pump blood normally and effectively. The goal of treatment is to prevent blood clots from forming, control your heart rate, or restore your heartbeat to a normal rhythm. If this condition is not treated, it can cause serious problems, such as a weakened heart muscle (cardiomyopathy) or a stroke. What are the causes? This condition is often caused by medical conditions that damage the heart's electrical system. These include: High blood pressure (hypertension). This is the most common cause. Certain heart problems or conditions, such as heart failure, coronary artery disease, heart valve problems, or heart surgery. Diabetes. Overactive thyroid (hyperthyroidism). Obesity. Chronic kidney disease. In some cases, the cause of this condition is not known. What increases the risk? This condition is more likely to develop in: Older people. People who smoke. Athletes who do endurance exercise. People who have a family history of atrial fibrillation. Men. People who use drugs. People who drink a lot of alcohol. People who have lung conditions, such as emphysema, pneumonia, or COPD. People who have obstructive sleep apnea. What are the signs or symptoms? Symptoms of this condition include: A feeling that your heart is racing or beating irregularly. Discomfort or pain in your chest. Shortness of breath. Sudden light-headedness or weakness. Tiring easily during exercise or activity. Fatigue. Syncope (fainting). Sweating. In some cases, there are no symptoms. How is this diagnosed? Your health care provider may detect atrial fibrillation when taking your pulse. If detected, this condition may be diagnosed with: An electrocardiogram (ECG) to check electrical signals of the  heart. An ambulatory cardiac monitor to record your heart's activity for a few days. A transthoracic echocardiogram (TTE) to create pictures of your heart. A transesophageal echocardiogram (TEE) to create even closer pictures of your heart. A stress test to check your blood supply while you exercise. Imaging tests, such as a CT scan or chest X-ray. Blood tests. How is this treated? Treatment depends on underlying conditions and how you feel when you experience atrial fibrillation. This condition may be treated with: Medicines to prevent blood clots or to treat heart rate or heart rhythm problems. Electrical cardioversion to reset the heart's rhythm. A pacemaker to correct abnormal heart rhythm. Ablation to remove the heart tissue that sends abnormal signals. Left atrial appendage closure to seal the area where blood clots can form. In some cases, underlying conditions will be treated. Follow these instructions at home: Medicines Take over-the counter and prescription medicines only as told by your health care provider. Do not take any new medicines without talking to your health care provider. If you are taking blood thinners: Talk with your health care provider before you take any medicines that contain aspirin or NSAIDs, such as ibuprofen. These medicines increase your risk for dangerous bleeding. Take your medicine exactly as told, at the same time every day. Avoid activities that could cause injury or bruising, and follow instructions about how to prevent falls. Wear a medical alert bracelet or carry a card that lists what medicines you take. Lifestyle     Do not use any products that contain nicotine or tobacco, such as cigarettes, e-cigarettes, and chewing tobacco. If you need help quitting, ask your health care provider. Eat heart-healthy foods. Talk with a dietitian to make an eating plan that is right for you. Exercise regularly as told   by your health care provider. Do not  drink alcohol. Lose weight if you are overweight. Do not use drugs, including cannabis. General instructions If you have obstructive sleep apnea, manage your condition as told by your health care provider. Do not use diet pills unless your health care provider approves. Diet pills can make heart problems worse. Keep all follow-up visits as told by your health care provider. This is important. Contact a health care provider if you: Notice a change in the rate, rhythm, or strength of your heartbeat. Are taking a blood thinner and you notice more bruising. Tire more easily when you exercise or do heavy work. Have a sudden change in weight. Get help right away if you have:  Chest pain, abdominal pain, sweating, or weakness. Trouble breathing. Side effects of blood thinners, such as blood in your vomit, stool, or urine, or bleeding that cannot stop. Any symptoms of a stroke. "BE FAST" is an easy way to remember the main warning signs of a stroke: B - Balance. Signs are dizziness, sudden trouble walking, or loss of balance. E - Eyes. Signs are trouble seeing or a sudden change in vision. F - Face. Signs are sudden weakness or numbness of the face, or the face or eyelid drooping on one side. A - Arms. Signs are weakness or numbness in an arm. This happens suddenly and usually on one side of the body. S - Speech. Signs are sudden trouble speaking, slurred speech, or trouble understanding what people say. T - Time. Time to call emergency services. Write down what time symptoms started. Other signs of a stroke, such as: A sudden, severe headache with no known cause. Nausea or vomiting. Seizure. These symptoms may represent a serious problem that is an emergency. Do not wait to see if the symptoms will go away. Get medical help right away. Call your local emergency services (911 in the [place] Do not drive yourself to the hospital. Summary Atrial fibrillation is a type of irregular or rapid  heartbeat (arrhythmia). Symptoms include a feeling that your heart is beating fast or irregularly. You may be given medicines to prevent blood clots or to treat heart rate or heart rhythm problems. Get help right away if you have signs or symptoms of a stroke. Get help right away if you cannot catch your breath or have chest pain or pressure. This information is not intended to replace advice given to you by your health care provider. Make sure you discuss any questions you have with your health care provider. Document Revised: 02/08/2019 Document Reviewed: 02/08/2019 Elsevier Patient Education  2023 Elsevier Inc.  

## 2022-01-20 NOTE — Progress Notes (Signed)
   Covid-19 Vaccination Clinic  Name:  Graycen Degan    MRN: [id number] DOB: 05/02/1934  01/20/2022  Ms. Hank was observed post Covid-19 immunization for 15 minutes without incident. She was provided with Vaccine Information Sheet and instruction to access the V-Safe system.   Ms. Ceasar was instructed to call 911 with any severe reactions post vaccine: Difficulty breathing  Swelling of face and throat  A fast heartbeat  A bad rash all over body  Dizziness and weakness   Immunizations Administered     Name Date Dose VIS Date Route   Moderna Covid-19 vaccine Bivalent Booster 01/20/2022  3:24 PM 0.5 mL 04/12/2021 Intramuscular   Manufacturer: Levan Hurst   Lot: [id number]   [place]: 59923-414-43

## 2022-01-21 LAB — LIPID PANEL
Cholesterol: 145 mg/dL (ref 0–200)
HDL: 41.8 mg/dL (ref >39.00)
LDL Cholesterol: 72 mg/dL (ref 0–99)
NonHDL: 102.75
Total CHOL/HDL Ratio: 3
Triglycerides: 152 mg/dL — ABNORMAL HIGH (ref 0.0–149.0)
VLDL: 30.4 mg/dL (ref 0.0–40.0)

## 2022-01-21 LAB — CBC WITH DIFFERENTIAL/PLATELET
Basophils Absolute: 0.1 10*3/uL (ref 0.0–0.1)
Basophils Relative: 1 % (ref 0.0–3.0)
Eosinophils Absolute: 0.2 10*3/uL (ref 0.0–0.7)
Eosinophils Relative: 2.7 % (ref 0.0–5.0)
HCT: 39.7 % (ref 36.0–46.0)
Hemoglobin: 13.1 g/dL (ref 12.0–15.0)
Lymphocytes Relative: 20.4 % (ref 12.0–46.0)
Lymphs Abs: 1.4 10*3/uL (ref 0.7–4.0)
MCHC: 33 g/dL (ref 30.0–36.0)
MCV: 94.7 fl (ref 78.0–100.0)
Monocytes Absolute: 0.6 10*3/uL (ref 0.1–1.0)
Monocytes Relative: 7.9 % (ref 3.0–12.0)
Neutro Abs: 4.8 10*3/uL (ref 1.4–7.7)
Neutrophils Relative %: 68 % (ref 43.0–77.0)
Platelets: 244 10*3/uL (ref 150.0–400.0)
RBC: 4.19 Mil/uL (ref 3.87–5.11)
RDW: 13.5 % (ref 11.5–15.5)
WBC: 7.1 10*3/uL (ref 4.0–10.5)

## 2022-01-21 LAB — URINALYSIS, ROUTINE W REFLEX MICROSCOPIC
Bilirubin Urine: NEGATIVE
Ketones, ur: NEGATIVE
Nitrite: NEGATIVE
Specific Gravity, Urine: 1.01 (ref 1.000–1.030)
Total Protein, Urine: NEGATIVE
Urine Glucose: NEGATIVE
Urobilinogen, UA: 0.2 (ref 0.0–1.0)
pH: 6.5 (ref 5.0–8.0)

## 2022-01-21 LAB — COMPREHENSIVE METABOLIC PANEL
ALT: 14 U/L (ref 0–35)
AST: 19 U/L (ref 0–37)
Albumin: 4.4 g/dL (ref 3.5–5.2)
Alkaline Phosphatase: 95 U/L (ref 39–117)
BUN: 23 mg/dL (ref 6–23)
CO2: 31 mEq/L (ref 19–32)
Calcium: 10 mg/dL (ref 8.4–10.5)
Chloride: 99 mEq/L (ref 96–112)
Creatinine, Ser: 1.15 mg/dL (ref 0.40–1.20)
GFR: 43.36 mL/min — ABNORMAL LOW (ref >60.00)
Glucose, Bld: 108 mg/dL — ABNORMAL HIGH (ref 70–99)
Potassium: 4.2 mEq/L (ref 3.5–5.1)
Sodium: 141 mEq/L (ref 135–145)
Total Bilirubin: 1.1 mg/dL (ref 0.2–1.2)
Total Protein: 6.7 g/dL (ref 6.0–8.3)

## 2022-01-21 LAB — TSH: TSH: 2.4 u[IU]/mL (ref 0.35–5.50)

## 2022-01-21 LAB — HEMOGLOBIN A1C: Hgb A1c MFr Bld: 6 % (ref 4.6–6.5)

## 2022-01-22 LAB — URINE CULTURE
MICRO NUMBER:: 13433660
SPECIMEN QUALITY:: ADEQUATE

## 2022-01-23 ENCOUNTER — Other Ambulatory Visit (HOSPITAL_BASED_OUTPATIENT_CLINIC_OR_DEPARTMENT_OTHER): Payer: Self-pay

## 2022-01-23 MED ORDER — MODERNA COVID-19 BIVAL BOOSTER 50 MCG/0.5ML IM SUSP
INTRAMUSCULAR | 0 refills | Status: DC
  Filled 2022-01-23: qty 0.5, 1d supply, fill #0

## 2022-01-27 ENCOUNTER — Encounter: Payer: Medicare Other | Admitting: Cardiology

## 2022-01-30 NOTE — Progress Notes (Signed)
Carelink Summary Report / Loop Recorder 

## 2022-02-04 ENCOUNTER — Telehealth: Payer: Self-pay | Admitting: *Deleted

## 2022-02-04 NOTE — Telephone Encounter (Signed)
Left message to call back.    Trying to move procedure date.

## 2022-02-05 NOTE — Telephone Encounter (Signed)
June 9 new arrival time 11:30 am.  Patient verbalized understanding agreement.

## 2022-02-06 ENCOUNTER — Encounter (HOSPITAL_COMMUNITY)
Admission: RE | Disposition: A | Discharge status: Home / Self Care | Payer: Medicare Other | Source: Home / Self Care | Attending: Cardiology

## 2022-02-06 ENCOUNTER — Encounter (HOSPITAL_COMMUNITY): Payer: Self-pay | Admitting: Anesthesiology

## 2022-02-06 ENCOUNTER — Ambulatory Visit (HOSPITAL_COMMUNITY): Payer: Medicare Other

## 2022-02-06 ENCOUNTER — Ambulatory Visit (HOSPITAL_COMMUNITY)
Admission: RE | Admit: 2022-02-06 | Discharge: 2022-02-06 | Disposition: A | Discharge status: Home / Self Care | Payer: Medicare Other | Attending: Cardiology | Admitting: Cardiology

## 2022-02-06 ENCOUNTER — Other Ambulatory Visit: Payer: Self-pay

## 2022-02-06 DIAGNOSIS — J849 Interstitial pulmonary disease, unspecified: Secondary | ICD-10-CM | POA: Diagnosis not present

## 2022-02-06 DIAGNOSIS — R001 Bradycardia, unspecified: Secondary | ICD-10-CM | POA: Diagnosis not present

## 2022-02-06 DIAGNOSIS — I4821 Permanent atrial fibrillation: Secondary | ICD-10-CM | POA: Diagnosis not present

## 2022-02-06 DIAGNOSIS — Z7901 Long term (current) use of anticoagulants: Secondary | ICD-10-CM | POA: Diagnosis not present

## 2022-02-06 DIAGNOSIS — I5032 Chronic diastolic (congestive) heart failure: Secondary | ICD-10-CM | POA: Diagnosis not present

## 2022-02-06 DIAGNOSIS — I350 Nonrheumatic aortic (valve) stenosis: Secondary | ICD-10-CM | POA: Diagnosis not present

## 2022-02-06 DIAGNOSIS — G4733 Obstructive sleep apnea (adult) (pediatric): Secondary | ICD-10-CM | POA: Diagnosis not present

## 2022-02-06 DIAGNOSIS — E785 Hyperlipidemia, unspecified: Secondary | ICD-10-CM | POA: Diagnosis not present

## 2022-02-06 DIAGNOSIS — I2510 Atherosclerotic heart disease of native coronary artery without angina pectoris: Secondary | ICD-10-CM | POA: Insufficient documentation

## 2022-02-06 DIAGNOSIS — I4892 Unspecified atrial flutter: Secondary | ICD-10-CM | POA: Diagnosis not present

## 2022-02-06 DIAGNOSIS — I110 Hypertensive heart disease with heart failure: Secondary | ICD-10-CM | POA: Diagnosis not present

## 2022-02-06 DIAGNOSIS — Z6841 Body Mass Index (BMI) 40.0 and over, adult: Secondary | ICD-10-CM | POA: Insufficient documentation

## 2022-02-06 DIAGNOSIS — J811 Chronic pulmonary edema: Secondary | ICD-10-CM | POA: Diagnosis not present

## 2022-02-06 HISTORY — PX: PACEMAKER IMPLANT: EP1218

## 2022-02-06 SURGERY — PACEMAKER IMPLANT

## 2022-02-06 MED ORDER — HEPARIN (PORCINE) IN NACL 1000-0.9 UT/500ML-% IV SOLN
INTRAVENOUS | Status: AC
  Filled 2022-02-06: qty 500

## 2022-02-06 MED ORDER — LIDOCAINE HCL (PF) 1 % IJ SOLN
INTRAMUSCULAR | Status: AC
  Filled 2022-02-06: qty 30

## 2022-02-06 MED ORDER — FENTANYL CITRATE (PF) 100 MCG/2ML IJ SOLN
INTRAMUSCULAR | Status: DC | PRN
  Administered 2022-02-06: 25 ug via INTRAVENOUS

## 2022-02-06 MED ORDER — DIPHENHYDRAMINE HCL 25 MG PO CAPS
ORAL_CAPSULE | ORAL | Status: AC
  Filled 2022-02-06: qty 1

## 2022-02-06 MED ORDER — CHLORHEXIDINE GLUCONATE 4 % EX LIQD
4.0000 | Freq: Once | CUTANEOUS | Status: DC
Start: 2022-02-06 — End: 2022-02-06

## 2022-02-06 MED ORDER — DIPHENHYDRAMINE HCL 25 MG PO CAPS
25.0000 mg | ORAL_CAPSULE | Freq: Four times a day (QID) | ORAL | Status: DC | PRN
  Administered 2022-02-06: 25 mg via ORAL
  Filled 2022-02-06: qty 1

## 2022-02-06 MED ORDER — DIPHENHYDRAMINE HCL 50 MG/ML IJ SOLN
INTRAMUSCULAR | Status: DC | PRN
  Administered 2022-02-06: 25 mg via INTRAVENOUS

## 2022-02-06 MED ORDER — POVIDONE-IODINE 10 % EX SWAB
2.0000 "application " | Freq: Once | CUTANEOUS | Status: AC
  Administered 2022-02-06: 2 via TOPICAL

## 2022-02-06 MED ORDER — ACETAMINOPHEN 325 MG PO TABS: 325.0000 mg | ORAL_TABLET | ORAL | Status: DC | PRN

## 2022-02-06 MED ORDER — DIPHENHYDRAMINE HCL 50 MG/ML IJ SOLN
INTRAMUSCULAR | Status: AC
  Filled 2022-02-06: qty 1

## 2022-02-06 MED ORDER — SODIUM CHLORIDE 0.9 % IV SOLN
INTRAVENOUS | Status: AC
  Filled 2022-02-06: qty 2

## 2022-02-06 MED ORDER — SODIUM CHLORIDE 0.9 % IV SOLN: INTRAVENOUS | Status: DC

## 2022-02-06 MED ORDER — LIDOCAINE HCL (PF) 1 % IJ SOLN
INTRAMUSCULAR | Status: DC | PRN
  Administered 2022-02-06: 60 mL
  Administered 2022-02-06: 30 mL

## 2022-02-06 MED ORDER — SODIUM CHLORIDE 0.9 % IV SOLN
80.0000 mg | INTRAVENOUS | Status: AC
  Administered 2022-02-06: 80 mg

## 2022-02-06 MED ORDER — MIDAZOLAM HCL 5 MG/5ML IJ SOLN
INTRAMUSCULAR | Status: DC | PRN
  Administered 2022-02-06: 1 mg via INTRAVENOUS

## 2022-02-06 MED ORDER — VANCOMYCIN HCL 1500 MG/300ML IV SOLN
1500.0000 mg | INTRAVENOUS | Status: AC
  Administered 2022-02-06: 1500 mg via INTRAVENOUS
  Filled 2022-02-06: qty 300

## 2022-02-06 MED ORDER — FENTANYL CITRATE (PF) 100 MCG/2ML IJ SOLN
INTRAMUSCULAR | Status: AC
  Filled 2022-02-06: qty 2

## 2022-02-06 MED ORDER — ONDANSETRON HCL 4 MG/2ML IJ SOLN: 4.0000 mg | Freq: Four times a day (QID) | INTRAMUSCULAR | Status: DC | PRN

## 2022-02-06 MED ORDER — LIDOCAINE HCL (PF) 1 % IJ SOLN
INTRAMUSCULAR | Status: AC
  Filled 2022-02-06: qty 60

## 2022-02-06 MED ORDER — HEPARIN (PORCINE) IN NACL 1000-0.9 UT/500ML-% IV SOLN
INTRAVENOUS | Status: DC | PRN
  Administered 2022-02-06: 500 mL

## 2022-02-06 MED ORDER — MIDAZOLAM HCL 5 MG/5ML IJ SOLN
INTRAMUSCULAR | Status: AC
  Filled 2022-02-06: qty 5

## 2022-02-06 SURGICAL SUPPLY — 13 items
CABLE SURGICAL S-101-97-12 (CABLE) ×2 IMPLANT
CATH AGILIS 7F 10.5F 38 HISPRO (CATHETERS) ×1 IMPLANT
CATH CPS LOCATOR 3D MED (CATHETERS) ×1 IMPLANT
HELIX LOCKING TOOL (MISCELLANEOUS) ×2
LEAD TENDRIL SDX 2088TC-58CM (Lead) ×1 IMPLANT
PACEMAKER ASSURITY SR-SF (Pacemaker) ×1 IMPLANT
PAD DEFIB RADIO PHYSIO CONN (PAD) ×2 IMPLANT
SHEATH 8FR PRELUDE SNAP 13 (SHEATH) IMPLANT
SHEATH 9FR PRELUDE SNAP 13 (SHEATH) ×1 IMPLANT
SHEATH PROBE COVER 6X72 (BAG) ×1 IMPLANT
TOOL HELIX LOCKING (MISCELLANEOUS) IMPLANT
TRAY PACEMAKER INSERTION (PACKS) ×2 IMPLANT
WIRE HI TORQ VERSACORE-J 145CM (WIRE) ×1 IMPLANT

## 2022-02-06 NOTE — Progress Notes (Signed)
Pt was itching during Pacemaker implant and was given benadryl, no hives or blotches seen on skin, scalp is red ware itching but no other signs, continues to itch /verbal order to give oral benadryl.

## 2022-02-06 NOTE — Interval H&P Note (Signed)
History and Physical Interval Note:  02/06/2022 12:27 PM  Tracy Miles  has presented today for surgery, with the diagnosis of bradycardia.  The various methods of treatment have been discussed with the patient and family. After consideration of risks, benefits and other options for treatment, the patient has consented to  Procedure(s): PACEMAKER IMPLANT (N/A) as a surgical intervention.  The patient's history has been reviewed, patient examined, no change in status, stable for surgery.  I have reviewed the patient's chart and labs.  Questions were answered to the patient's satisfaction.     Tracy Miles

## 2022-02-06 NOTE — Discharge Instructions (Signed)
    Supplemental Discharge Instructions for  Pacemaker/Defibrillator Patients  Tomorrow, 02/07/22, send in a device transmission  Activity No heavy lifting or vigorous activity with your left/right arm for 6 to 8 weeks.  Do not raise your left/right arm above your head for one week.  Gradually raise your affected arm as drawn below.              02/11/22                   02/12/22                     02/13/22                  02/14/22  __  NO DRIVING for  1 week  ; you may begin driving on   2/50/03  .  WOUND CARE Keep the wound area clean and dry.  Do not get this area wet , no showers for one week; you may shower on  02/14/22   . Tomorrow, 02/07/22, remove the arm sling Tomorrow, 02/07/22 remove the outer plastic bandage.  Underneath the plastic bandage there are steri strips (paper tapes), DO NOT remove these. The tape/steri-strips on your wound will fall off; do not pull them off.  No bandage is needed on the site.  DO  NOT apply any creams, oils, or ointments to the wound area. If you notice any drainage or discharge from the wound, any swelling or bruising at the site, or you develop a fever > 101? F after you are discharged home, call the office at once.  Special Instructions You are still able to use cellular telephones; use the ear opposite the side where you have your pacemaker/defibrillator.  Avoid carrying your cellular phone near your device. When traveling through airports, show security personnel your identification card to avoid being screened in the metal detectors.  Ask the security personnel to use the hand wand. Avoid arc welding equipment, MRI testing (magnetic resonance imaging), TENS units (transcutaneous nerve stimulators).  Call the office for questions about other devices. Avoid electrical appliances that are in poor condition or are not properly grounded. Microwave ovens are safe to be near or to operate.

## 2022-02-08 ENCOUNTER — Emergency Department (HOSPITAL_BASED_OUTPATIENT_CLINIC_OR_DEPARTMENT_OTHER)
Admission: EM | Admit: 2022-02-08 | Discharge: 2022-02-08 | Disposition: A | Discharge status: Home / Self Care | Payer: Medicare Other | Attending: Emergency Medicine | Admitting: Emergency Medicine

## 2022-02-08 ENCOUNTER — Encounter (HOSPITAL_BASED_OUTPATIENT_CLINIC_OR_DEPARTMENT_OTHER): Payer: Self-pay | Admitting: Emergency Medicine

## 2022-02-08 ENCOUNTER — Other Ambulatory Visit: Payer: Self-pay

## 2022-02-08 ENCOUNTER — Emergency Department (HOSPITAL_BASED_OUTPATIENT_CLINIC_OR_DEPARTMENT_OTHER): Payer: Medicare Other

## 2022-02-08 DIAGNOSIS — K439 Ventral hernia without obstruction or gangrene: Secondary | ICD-10-CM | POA: Diagnosis not present

## 2022-02-08 DIAGNOSIS — R109 Unspecified abdominal pain: Secondary | ICD-10-CM | POA: Diagnosis present

## 2022-02-08 DIAGNOSIS — I809 Phlebitis and thrombophlebitis of unspecified site: Secondary | ICD-10-CM | POA: Diagnosis not present

## 2022-02-08 DIAGNOSIS — R102 Pelvic and perineal pain: Secondary | ICD-10-CM | POA: Diagnosis not present

## 2022-02-08 DIAGNOSIS — I8001 Phlebitis and thrombophlebitis of superficial vessels of right lower extremity: Secondary | ICD-10-CM | POA: Diagnosis not present

## 2022-02-08 DIAGNOSIS — R600 Localized edema: Secondary | ICD-10-CM | POA: Diagnosis present

## 2022-02-08 DIAGNOSIS — Z7901 Long term (current) use of anticoagulants: Secondary | ICD-10-CM | POA: Diagnosis not present

## 2022-02-08 DIAGNOSIS — J811 Chronic pulmonary edema: Secondary | ICD-10-CM | POA: Diagnosis not present

## 2022-02-08 DIAGNOSIS — N200 Calculus of kidney: Secondary | ICD-10-CM | POA: Diagnosis not present

## 2022-02-08 DIAGNOSIS — K8020 Calculus of gallbladder without cholecystitis without obstruction: Secondary | ICD-10-CM | POA: Diagnosis not present

## 2022-02-08 DIAGNOSIS — I10 Essential (primary) hypertension: Secondary | ICD-10-CM | POA: Diagnosis not present

## 2022-02-08 DIAGNOSIS — I700 Atherosclerosis of aorta: Secondary | ICD-10-CM | POA: Diagnosis not present

## 2022-02-08 DIAGNOSIS — I808 Phlebitis and thrombophlebitis of other sites: Secondary | ICD-10-CM | POA: Insufficient documentation

## 2022-02-08 DIAGNOSIS — R000 Tachycardia, unspecified: Secondary | ICD-10-CM | POA: Insufficient documentation

## 2022-02-08 LAB — CBC WITH DIFFERENTIAL/PLATELET
Abs Immature Granulocytes: 0.02 10*3/uL (ref 0.00–0.07)
Basophils Absolute: 0 10*3/uL (ref 0.0–0.1)
Basophils Relative: 1 %
Eosinophils Absolute: 0.3 10*3/uL (ref 0.0–0.5)
Eosinophils Relative: 4 %
HCT: 35.7 % — ABNORMAL LOW (ref 36.0–46.0)
Hemoglobin: 12 g/dL (ref 12.0–15.0)
Immature Granulocytes: 0 %
Lymphocytes Relative: 13 %
Lymphs Abs: 0.9 10*3/uL (ref 0.7–4.0)
MCH: 31.1 pg (ref 26.0–34.0)
MCHC: 33.6 g/dL (ref 30.0–36.0)
MCV: 92.5 fL (ref 80.0–100.0)
Monocytes Absolute: 0.6 10*3/uL (ref 0.1–1.0)
Monocytes Relative: 8 %
Neutro Abs: 5.6 10*3/uL (ref 1.7–7.7)
Neutrophils Relative %: 74 %
Platelets: 185 10*3/uL (ref 150–400)
RBC: 3.86 MIL/uL — ABNORMAL LOW (ref 3.87–5.11)
RDW: 13.8 % (ref 11.5–15.5)
WBC: 7.5 10*3/uL (ref 4.0–10.5)
nRBC: 0 % (ref 0.0–0.2)

## 2022-02-08 LAB — COMPREHENSIVE METABOLIC PANEL
ALT: 13 U/L (ref 0–44)
AST: 18 U/L (ref 15–41)
Albumin: 3.4 g/dL — ABNORMAL LOW (ref 3.5–5.0)
Alkaline Phosphatase: 81 U/L (ref 38–126)
Anion gap: 6 (ref 5–15)
BUN: 13 mg/dL (ref 8–23)
CO2: 26 mmol/L (ref 22–32)
Calcium: 8.9 mg/dL (ref 8.9–10.3)
Chloride: 106 mmol/L (ref 98–111)
Creatinine, Ser: 0.92 mg/dL (ref 0.44–1.00)
GFR, Estimated: >60 mL/min (ref >60)
Glucose, Bld: 113 mg/dL — ABNORMAL HIGH (ref 70–99)
Potassium: 3.8 mmol/L (ref 3.5–5.1)
Sodium: 138 mmol/L (ref 135–145)
Total Bilirubin: 1.3 mg/dL — ABNORMAL HIGH (ref 0.3–1.2)
Total Protein: 6.2 g/dL — ABNORMAL LOW (ref 6.5–8.1)

## 2022-02-08 LAB — MAGNESIUM: Magnesium: 1.9 mg/dL (ref 1.7–2.4)

## 2022-02-08 LAB — TSH: TSH: 3.018 u[IU]/mL (ref 0.350–4.500)

## 2022-02-08 LAB — LACTIC ACID, PLASMA: Lactic Acid, Venous: 1.3 mmol/L (ref 0.5–1.9)

## 2022-02-08 LAB — TROPONIN I (HIGH SENSITIVITY)
Troponin I (High Sensitivity): 29 ng/L — ABNORMAL HIGH (ref <18)
Troponin I (High Sensitivity): 31 ng/L — ABNORMAL HIGH (ref <18)

## 2022-02-08 LAB — BRAIN NATRIURETIC PEPTIDE: B Natriuretic Peptide: 313.9 pg/mL — ABNORMAL HIGH (ref 0.0–100.0)

## 2022-02-08 MED ORDER — IOHEXOL 300 MG/ML  SOLN
100.0000 mL | Freq: Once | INTRAMUSCULAR | Status: AC | PRN
  Administered 2022-02-08: 100 mL via INTRAVENOUS

## 2022-02-08 NOTE — ED Provider Notes (Signed)
[facility] DEPART[facility] Provider Note   CSN: [id number] Arrival date & time: 02/08/22  4098     History  Chief Complaint  Patient presents with   Groin Swelling    Tracy Miles is a 88 y.o. female.  The history is provided by the patient and medical records. No language interpreter was used.  Abdominal Pain Pain location:  Suprapubic (r pubic area) Pain quality: aching   Pain radiates to:  Does not radiate Pain severity:  Moderate Onset quality:  Gradual Duration:  1 day Timing:  Constant Progression:  Worsening Chronicity:  New Context: not trauma   Relieved by:  Nothing Worsened by:  Palpation Ineffective treatments:  None tried Associated symptoms: no anorexia, no chest pain, no chills, no constipation, no cough, no diarrhea, no dysuria, no fatigue, no flatus, no nausea, no shortness of breath, no vaginal bleeding, no vaginal discharge and no vomiting        Home Medications Prior to Admission medications   Medication Sig Start Date End Date Taking? Authorizing Provider  acetaminophen (TYLENOL) 325 MG tablet Take 325 mg by mouth every morning.    [provider]  atorvastatin (LIPITOR) 20 MG tablet TAKE 1 TABLET BY MOUTH  DAILY 09/29/21   [name], MD  bumetanide (BUMEX) 1 MG tablet TAKE 1 TABLET BY MOUTH DAILY 12/29/21   [name], MD  Calcium Carb-Cholecalciferol (CALCIUM 600 + D PO) Take 1 tablet by mouth 2 (two) times daily.    [provider]  Carboxymethylcellul-Glycerin (LUBRICATING EYE DROPS OP) Place 1 drop into both eyes in the morning and at bedtime.    [provider]  clindamycin (CLEOCIN) 300 MG capsule Take 1,200 mg by mouth See admin instructions. Before dental work 09/09/21   [provider]  COVID-19 mRNA bivalent vaccine, Moderna, (MODERNA COVID-19 BIVAL BOOSTER) 50 MCG/0.5ML injection Inject into the muscle. 01/20/22   Carlyle Basques, MD  diltiazem (CARDIZEM CD) 120 MG 24 hr  capsule TAKE ONE (1) CAPSULE EACH DAY BY MOUTH 12/16/21   Allred, Jeneen Rinks, MD  diltiazem (CARDIZEM) 30 MG tablet Take 1 Tablet Every 4 Hours As Needed For HR >100 and Top BP >100 Patient not taking: Reported on 01/29/2022 10/20/21   Sherran Needs, NP  ELIQUIS 5 MG TABS tablet TAKE ONE TABLET EVERY MORNING AND AT BEDTIME 12/15/21   [name], MD  lisinopril (ZESTRIL) 10 MG tablet Take 1 tablet (10 mg total) by mouth daily. 09/22/21   [name], MD  loratadine (CLARITIN) 10 MG tablet Take 10 mg by mouth daily.     [provider]  metoprolol succinate (TOPROL-XL) 50 MG 24 hr tablet Take 1 tablet (50 mg total) by mouth 2 (two) times daily. 10/23/21   Sherran Needs, NP  Multiple Vitamins-Minerals (ONE-A-DAY WOMENS 50 PLUS PO) Take 1 tablet by mouth daily.    [provider]  potassium chloride (KLOR-CON) 10 MEQ tablet TAKE 1 TABLET BY MOUTH IN  THE MORNING AND 2 TABLETS  BY MOUTH IN THE EVENING 08/01/21   Sherran Needs, NP  Probiotic CAPS Take 1 capsule by mouth daily.    [provider]  psyllium (REGULOID) 0.52 g capsule Take 0.52 g by mouth 2 (two) times daily.     [provider]      Allergies    Gabapentin, Lactose intolerance (gi), Zebeta [bisoprolol fumarate], Penicillins, Sulfa antibiotics, and Vancomycin    Review of Systems   Review of  Systems  Constitutional:  Negative for chills and fatigue.  HENT:  Negative for congestion.   Respiratory:  Negative for cough, choking, chest tightness, shortness of breath and wheezing.   Cardiovascular:  Negative for chest pain, palpitations and leg swelling.  Gastrointestinal:  Positive for abdominal pain. Negative for abdominal distention, anorexia, constipation, diarrhea, flatus, nausea and vomiting.  Genitourinary:  Positive for pelvic pain. Negative for decreased urine volume, dysuria, flank pain, frequency, vaginal bleeding, vaginal discharge and vaginal pain.  Musculoskeletal:  Negative for back  pain, neck pain and neck stiffness.  Skin:  Negative for rash and wound.  Neurological:  Negative for light-headedness and headaches.  Psychiatric/Behavioral:  Negative for agitation and confusion.   All other systems reviewed and are negative.   Physical Exam Updated Vital Signs BP (!) 152/106   Pulse (!) 134   Temp (!) 97.4 F (36.3 C) (Oral)   Resp 20   Ht '5\' 3"'$  (1.6 m)   Wt 124.7 kg   SpO2 97%   BMI 48.71 kg/m  Physical Exam Vitals and nursing note reviewed.  Constitutional:      General: She is not in acute distress.    Appearance: She is well-developed.  HENT:     Head: Normocephalic and atraumatic.     Nose: No congestion.     Mouth/Throat:     Mouth: Mucous membranes are moist.     Pharynx: No oropharyngeal exudate or posterior oropharyngeal erythema.  Eyes:     Extraocular Movements: Extraocular movements intact.     Conjunctiva/sclera: Conjunctivae normal.     Pupils: Pupils are equal, round, and reactive to light.  Cardiovascular:     Rate and Rhythm: Tachycardia present. Rhythm irregular.     Heart sounds: No murmur heard. Pulmonary:     Effort: Pulmonary effort is normal. No respiratory distress.     Breath sounds: Normal breath sounds.  Abdominal:     General: Abdomen is flat.     Palpations: Abdomen is soft.     Tenderness: There is abdominal tenderness. There is no right CVA tenderness, left CVA tenderness, guarding or rebound.    Musculoskeletal:        General: Tenderness present. No swelling.     Cervical back: Neck supple.     Right lower leg: No edema.     Left lower leg: No edema.  Skin:    General: Skin is warm and dry.     Capillary Refill: Capillary refill takes less than 2 seconds.     Findings: Erythema present. No rash.  Neurological:     General: No focal deficit present.     Mental Status: She is alert.  Psychiatric:        Mood and Affect: Mood normal.    ED Results / Procedures / Treatments   Labs (all labs ordered are  listed, but only abnormal results are displayed) Labs Reviewed  CBC WITH DIFFERENTIAL/PLATELET - Abnormal; Notable for the following components:      Result Value   RBC 3.86 (*)    HCT 35.7 (*)    All other components within normal limits  COMPREHENSIVE METABOLIC PANEL - Abnormal; Notable for the following components:   Glucose, Bld 113 (*)    Total Protein 6.2 (*)    Albumin 3.4 (*)    Total Bilirubin 1.3 (*)    All other components within normal limits  BRAIN NATRIURETIC PEPTIDE - Abnormal; Notable for the following components:   B Natriuretic Peptide 313.9 (*)  All other components within normal limits  TROPONIN I (HIGH SENSITIVITY) - Abnormal; Notable for the following components:   Troponin I (High Sensitivity) 29 (*)    All other components within normal limits  TROPONIN I (HIGH SENSITIVITY) - Abnormal; Notable for the following components:   Troponin I (High Sensitivity) 31 (*)    All other components within normal limits  LACTIC ACID, PLASMA  MAGNESIUM  TSH    EKG EKG Interpretation  Date/Time:  Sunday February 08 2022 08:47:40 EDT Ventricular Rate:  128 PR Interval:    QRS Duration: 79 QT Interval:  333 QTC Calculation: 486 R Axis:   27 Text Interpretation: Atrial fibrillation Low voltage, precordial leads Abnormal R-wave progression, early transition Borderline prolonged QT interval when compared to prior,  similar afib. NO STEMI Confirmed by Antony Blackbird [phone]) on 02/08/2022 9:45:26 AM  Radiology CT PELVIS WO CONTRAST  Result Date: 02/08/2022 CLINICAL DATA:  New groin lumps suspicious for venous thrombosis. EXAM: CT ABDOMEN AND PELVIS WITHOUT AND WITH CONTRAST TECHNIQUE: Multidetector CT imaging of the pelvis was performed following the standard protocol before administration of intravenous contrast. Multidetector CT imaging of the abdomen and pelvis was performed following the standard protocol following the bolus administration of intravenous contrast. RADIATION  DOSE REDUCTION: This exam was performed according to the departmental dose-optimization program which includes automated exposure control, adjustment of the mA and/or kV according to patient size and/or use of iterative reconstruction technique. CONTRAST:  119m OMNIPAQUE IOHEXOL 300 MG/ML  SOLN COMPARISON:  None Available. FINDINGS: Lower chest: Unremarkable Hepatobiliary: No suspicious focal abnormality within the liver parenchyma. Tiny calcified gallstone evident. No intrahepatic or extrahepatic biliary dilation. Pancreas: No focal mass lesion. No dilatation of the main duct. No intraparenchymal cyst. No peripancreatic edema. Spleen: No splenomegaly. No focal mass lesion. Adrenals/Urinary Tract: No adrenal nodule or mass. 2 mm nonobstructing stone noted lower pole right kidney. Left kidney unremarkable. No evidence for hydroureter. The urinary bladder appears normal for the degree of distention. Stomach/Bowel: Stomach is decompressed. Duodenum is normally positioned as is the ligament of Treitz. No small bowel wall thickening. No small bowel dilatation. The terminal ileum is normal. The appendix is not well visualized, but there is no edema or inflammation in the region of the cecum. No gross colonic mass. No colonic wall thickening. Vascular/Lymphatic: There is moderate atherosclerotic calcification of the abdominal aorta without aneurysm. Extensive venous collateralization is noted in the subcutaneous tissues of the anterior abdominal wall. There is extensive venous collateralization in the suprapubic region and left groin regions as well with venous varices noted in the lower left anterior abdominal wall (71/2) and right paramidline suprapubic region (91/2) in just anterior to the vaginal labia, right greater than left (see image 19 of axial series 7). Although these varices just anterior to the labia do appear to diffusely enhance, there is fairly prominent edema/inflammation around them. Venous anatomy of  the pelvis is not well opacified but the left external iliac vein is markedly tiny with central calcification noted on 68/2, suggesting chronic thrombosis. Right external iliac vein is well-defined without perivascular edema or inflammation does not appear enlarged. While no secondary signs of thrombus are evident, lack of opacification precludes definitive assessment. Reproductive: The uterus is surgically absent. There is no adnexal mass. Other: No intraperitoneal free fluid. Musculoskeletal: No worrisome lytic or sclerotic osseous abnormality. Paraumbilical ventral hernia contains a short segment of transverse colon without complicating features. IMPRESSION: 1. Extensive venous collateralization in the subcutaneous tissues of the anterior  abdominal wall with extensive venous collateralization in the suprapubic region, left groin region, and in the area area just anterior to the vaginal labia. Prominent varices in the low right anterior pelvic wall near the labia have surrounding edema/fluid or inflammation. While no definite filling defect identified to suggest the presence that these varices are thrombosed, given the edema/inflammation in the area, acute thrombosis would be a consideration. No definite contrast extravasation to suggest leak. 2. The left external iliac vein is markedly tiny with central calcification, suggesting chronic thrombosis. Right external iliac vein is well-defined without perivascular edema or inflammation. While no secondary signs of thrombus are evident in the right external iliac vein, lack of opacification precludes definitive assessment. 3. 2 mm nonobstructing stone lower pole right kidney. 4. Paraumbilical ventral hernia contains a short segment of transverse colon without complicating features. 5. Cholelithiasis. 6. Aortic Atherosclerosis (ICD10-I70.0). Electronically Signed   By: Misty Stanley M.D.   On: 02/08/2022 11:29   CT ABDOMEN PELVIS W CONTRAST  Result Date:  02/08/2022 CLINICAL DATA:  New groin lumps suspicious for venous thrombosis. EXAM: CT ABDOMEN AND PELVIS WITHOUT AND WITH CONTRAST TECHNIQUE: Multidetector CT imaging of the pelvis was performed following the standard protocol before administration of intravenous contrast. Multidetector CT imaging of the abdomen and pelvis was performed following the standard protocol following the bolus administration of intravenous contrast. RADIATION DOSE REDUCTION: This exam was performed according to the departmental dose-optimization program which includes automated exposure control, adjustment of the mA and/or kV according to patient size and/or use of iterative reconstruction technique. CONTRAST:  176m OMNIPAQUE IOHEXOL 300 MG/ML  SOLN COMPARISON:  None Available. FINDINGS: Lower chest: Unremarkable Hepatobiliary: No suspicious focal abnormality within the liver parenchyma. Tiny calcified gallstone evident. No intrahepatic or extrahepatic biliary dilation. Pancreas: No focal mass lesion. No dilatation of the main duct. No intraparenchymal cyst. No peripancreatic edema. Spleen: No splenomegaly. No focal mass lesion. Adrenals/Urinary Tract: No adrenal nodule or mass. 2 mm nonobstructing stone noted lower pole right kidney. Left kidney unremarkable. No evidence for hydroureter. The urinary bladder appears normal for the degree of distention. Stomach/Bowel: Stomach is decompressed. Duodenum is normally positioned as is the ligament of Treitz. No small bowel wall thickening. No small bowel dilatation. The terminal ileum is normal. The appendix is not well visualized, but there is no edema or inflammation in the region of the cecum. No gross colonic mass. No colonic wall thickening. Vascular/Lymphatic: There is moderate atherosclerotic calcification of the abdominal aorta without aneurysm. Extensive venous collateralization is noted in the subcutaneous tissues of the anterior abdominal wall. There is extensive venous  collateralization in the suprapubic region and left groin regions as well with venous varices noted in the lower left anterior abdominal wall (71/2) and right paramidline suprapubic region (91/2) in just anterior to the vaginal labia, right greater than left (see image 19 of axial series 7). Although these varices just anterior to the labia do appear to diffusely enhance, there is fairly prominent edema/inflammation around them. Venous anatomy of the pelvis is not well opacified but the left external iliac vein is markedly tiny with central calcification noted on 68/2, suggesting chronic thrombosis. Right external iliac vein is well-defined without perivascular edema or inflammation does not appear enlarged. While no secondary signs of thrombus are evident, lack of opacification precludes definitive assessment. Reproductive: The uterus is surgically absent. There is no adnexal mass. Other: No intraperitoneal free fluid. Musculoskeletal: No worrisome lytic or sclerotic osseous abnormality. Paraumbilical ventral hernia contains a short  segment of transverse colon without complicating features. IMPRESSION: 1. Extensive venous collateralization in the subcutaneous tissues of the anterior abdominal wall with extensive venous collateralization in the suprapubic region, left groin region, and in the area area just anterior to the vaginal labia. Prominent varices in the low right anterior pelvic wall near the labia have surrounding edema/fluid or inflammation. While no definite filling defect identified to suggest the presence that these varices are thrombosed, given the edema/inflammation in the area, acute thrombosis would be a consideration. No definite contrast extravasation to suggest leak. 2. The left external iliac vein is markedly tiny with central calcification, suggesting chronic thrombosis. Right external iliac vein is well-defined without perivascular edema or inflammation. While no secondary signs of thrombus  are evident in the right external iliac vein, lack of opacification precludes definitive assessment. 3. 2 mm nonobstructing stone lower pole right kidney. 4. Paraumbilical ventral hernia contains a short segment of transverse colon without complicating features. 5. Cholelithiasis. 6. Aortic Atherosclerosis (ICD10-I70.0). Electronically Signed   By: Misty Stanley M.D.   On: 02/08/2022 11:29   DG Chest Port 1 View  Result Date: 02/08/2022 CLINICAL DATA:  Pt arrives pov with driver, slow gait c/o swollen suprapubic varicose vein upon waking. Recent pacemaker placed x 2 days pta. Denies shob EXAM: PORTABLE CHEST - 1 VIEW COMPARISON:  02/06/2022 FINDINGS: Mild central pulmonary vascular congestion. Patchy for hilar atelectasis or infiltrate. Heart size upper limits normal for technique. Stable left subclavian pacemaker, single lead directed towards the RV apex. No effusion. Visualized bones unremarkable. IMPRESSION: Stable pulmonary vascular congestion.  No acute findings. Electronically Signed   By: Lucrezia Europe M.D.   On: 02/08/2022 10:51   DG Chest 2 View  Result Date: 02/08/2022 CLINICAL DATA:  Reason for exam: post op for pacemaker *pt unable to lift left arm for lateral due to surgery* EXAM: CHEST - 2 VIEW COMPARISON:  03/13/2019 FINDINGS: Interval placement of left subclavian single lead transvenous implanted device, tip directed towards the RV apex. No pneumothorax. Mild pulmonary vascular congestion. Heart size upper limits normal. No effusion. Visualized bones unremarkable. IMPRESSION: No pneumothorax post left subclavian pacemaker placement Electronically Signed   By: Lucrezia Europe M.D.   On: 02/08/2022 10:50    Procedures Procedures    Medications Ordered in ED Medications  iohexol (OMNIPAQUE) 300 MG/ML solution 100 mL (100 mLs Intravenous Contrast Given 02/08/22 1033)    ED Course/ Medical Decision Making/ A&P                           Medical Decision Making Amount and/or Complexity of Data  Reviewed Labs: ordered. Radiology: ordered.  Risk Prescription drug management.   Tracy Miles is a 88 y.o. female with a past medical history significant for atrial fibrillation status post pacemaker placement 2 days ago currently holding her Eliquis, previous DVT, hypertension, hyperlipidemia, CAD, and previous appendectomy who presents with right pelvic pain and swelling.  According to patient, when she has had blood clots in the past, she has had varicose veins appear in her abdomen with bulges.  She says that she chronically has had a small bump on her right groin area that was not bothering her but today she noticed a new bulge that is very painful in her right groin.  She reports it just sprung up today and she is concerned about recurrent blood clots.  She reports she is having diffuse pain in her abdomen otherwise is denying chest  pain palpitations or shortness of breath worse than baseline.  She denies any leg pain or leg swelling.  She reports the pain is moderate to severe but does not want pain medicine at this time.  She denies nausea, vomiting, constipation, diarrhea, urinary changes, or vaginal symptoms.  With a chaperone, patient was examined.  Patient does have a palpable bulge in her right pelvic area that is quite tender to palpation.  The tenderness extends superiorly towards her right lower quadrant and suprapubic area.  I attempted a bedside ultrasound and there does not appear to be evidence of abscess but does appear to show a vascular structure that could indeed have clot inside.  It did not appear pulsatile but there was some blood flow around it.  Bowel sounds were appreciated.  Abdomen was slightly tender.  Chest slightly tender from her recent procedure but otherwise lungs were clear.  Did not appreciate murmur initially.  Legs nontender and had intact pulses.  EKG showed A-fib but no STEMI and on telemetry she was seen to have variable rates between 100-130s.     Clinically I am concerned about venous thrombus in the abdomen/pelvis area.  Due to the peculiar location of her symptoms, I spoke to radiology who recommended doing a CT abdomen pelvis with contrast and CT pelvis without contrast to further evaluate.  I asked if we needed a CTA and they said that to better see the veins they would recommend not doing the CTA.  She did have intact pulses distally.  We will also get screening labs and a chest x-ray due to the chest tenderness and the tachycardia as well.  Anticipate reassessment after work-up to determine disposition.  I personally viewed the CT images and I am concerned about thrombophlebitis in her groin.  Called and spoke to the radiologist who agreed this looks like a thrombophlebitis or thrombus of the veins in her groin with surrounding edema and inflammation.  He agreed this did not appear like abscess at this time.  I spoke to Dr. Donzetta Matters with vascular surgery who looked at the images and feels this is likely superficial thrombophlebitis but is likely exquisitely painful.  He recommended anticoagulation and treatment for thrombophlebitis.  Due to her being told not to take her Eliquis after having surgery, will speak with cardiology to discuss a plan  12:37 PM Spoke to Dr. Curt Bears with the electrophysiology team with cardiology who does feel that given her lack of evidence of bleeding at the site they feel she is reasonable to restart her Eliquis.  They do not feel she needs admission and patient does not want to be admitted.  Patient will start her Eliquis at home and follow-up with her cardiology team.  She is reporting some mild pain in her groin but can use over-the-counter medications at home.  She understood return precautions and follow-up instructions and will be discharged with instructions for superficial thrombophlebitis treatment and follow-up.           Final Clinical Impression(s) / ED Diagnoses Final diagnoses:   Superficial thrombophlebitis involving other site  Pelvic pain    Rx / DC Orders ED Discharge Orders     None       Clinical Impression: 1. Superficial thrombophlebitis involving other site   2. Pelvic pain     Disposition: Discharge  Condition: Good  I have discussed the results, Dx and Tx plan with the pt(& family if present). He/she/they expressed understanding and agree(s) with the plan. Discharge  instructions discussed at great length. Strict return precautions discussed and pt &/or family have verbalized understanding of the instructions. No further questions at time of discharge.    New Prescriptions   No medications on file    Follow Up: No follow-up provider specified.    Evvie Behrmann, Gwenyth Allegra, MD 02/08/22 [phone]

## 2022-02-08 NOTE — ED Notes (Signed)
Two blister on her groin area.Skin intact.color approiate

## 2022-02-08 NOTE — ED Notes (Signed)
Pt in radiology 

## 2022-02-08 NOTE — ED Triage Notes (Signed)
Pt arrives pov with driver, slow gait c/o swollen suprapubic varicose vein upon waking. Recent pacemaker placed x 2 days pta. Denies shob

## 2022-02-08 NOTE — Discharge Instructions (Signed)
Your history, exam and work-up today confirmed thrombophlebitis of the venous vessel in your abdomen/groin.  We spoke to the vascular surgery team who wanted you back on blood thinners.  I spoke to the cardiology team who agreed with starting blood thinners given your lack of bleeding at the recent pacemaker site.  Given your otherwise well appearance, we agree with discharge home but please start your Eliquis again and continue your home medicines.  If any symptoms change or worsen acutely, please return to the nearest emergency department.

## 2022-02-09 ENCOUNTER — Other Ambulatory Visit (HOSPITAL_COMMUNITY): Payer: Self-pay | Admitting: Nurse Practitioner

## 2022-02-09 ENCOUNTER — Encounter (HOSPITAL_COMMUNITY): Payer: Self-pay | Admitting: Cardiology

## 2022-02-13 ENCOUNTER — Telehealth: Payer: Self-pay | Admitting: *Deleted

## 2022-02-13 NOTE — Telephone Encounter (Addendum)
Notified patient of new arrival time for procedure on July 24 at 2:30 pm.  Verbalized understanding and agreement.   Patient would like to note after her PPM implant on 6/9 she called the after hours line on the morning of 6/11 with concerns and left a message for call back. She never got a call back and ended up going to the ED with a blood clot.

## 2022-02-18 ENCOUNTER — Ambulatory Visit (INDEPENDENT_AMBULATORY_CARE_PROVIDER_SITE_OTHER): Payer: Medicare Other

## 2022-02-18 DIAGNOSIS — I5032 Chronic diastolic (congestive) heart failure: Secondary | ICD-10-CM | POA: Diagnosis not present

## 2022-02-18 LAB — CUP PACEART INCLINIC DEVICE CHECK
Battery Remaining Longevity: 124 mo
Battery Voltage: 2.99 V
Brady Statistic RV Percent Paced: 0.73 %
Date Time Interrogation Session: 20230621104500
Implantable Lead Implant Date: 20230609
Implantable Lead Location: 753860
Implantable Pulse Generator Implant Date: 20230609
Lead Channel Impedance Value: 475 Ohm
Lead Channel Pacing Threshold Amplitude: 0.75 V
Lead Channel Pacing Threshold Amplitude: 0.75 V
Lead Channel Pacing Threshold Pulse Width: 0.5 ms
Lead Channel Pacing Threshold Pulse Width: 0.5 ms
Lead Channel Sensing Intrinsic Amplitude: 12 mV
Lead Channel Setting Pacing Amplitude: 3.5 V
Lead Channel Setting Pacing Pulse Width: 0.5 ms
Lead Channel Setting Sensing Sensitivity: 2 mV
Pulse Gen Model: 1272
Pulse Gen Serial Number: 8068284

## 2022-02-18 NOTE — Progress Notes (Signed)
BMP was ordered given the patient's swelling and edema to rule out fluid overload.

## 2022-02-18 NOTE — Progress Notes (Signed)
Wound check appointment. Steri-strips removed. Wound without edema. Redness noted around incision and ILR site due to what appears as sensitivity to dressings, patient does report improvement. Incision are not edges approximated. Additional steri-strips applied and will return for wound recheck in 1 week.  Normal device function. Thresholds, sensing, and impedances consistent with implant measurements. Device programmed at 3.5V for extra safety margin until 3 month visit. Histogram distribution appropriate for patient and level of activity. No ventricular arrhythmias noted. Patient educated about wound care, arm mobility, lifting restrictions, shock plan. ROV in 3 months with implanting physician.

## 2022-02-18 NOTE — Patient Instructions (Signed)
   After Your Pacemaker   Monitor your pacemaker site for redness, swelling, and drainage. Call the device clinic at [phone] if you experience these symptoms or fever/chills.  Do NOT shower or let water get on steri-strips until seen at next office visit and cleared by nurse.   Do not lift, push or pull greater than 10 pounds with the affected arm until 6 weeks after your procedure. There are no other restrictions in arm movement after your wound check appointment.  You may drive, unless driving has been restricted by your healthcare providers.  Your Pacemaker is MRI compatible.  Remote monitoring is used to monitor your pacemaker from home. This monitoring is scheduled every 91 days by our office. It allows [place] to keep an eye on the functioning of your device to ensure it is working properly. You will routinely see your Electrophysiologist annually (more often if necessary).

## 2022-02-25 ENCOUNTER — Ambulatory Visit: Payer: Medicare Other

## 2022-02-25 DIAGNOSIS — Z950 Presence of cardiac pacemaker: Secondary | ICD-10-CM

## 2022-02-25 NOTE — Progress Notes (Signed)
Wound re-check steri strips removed, incision edges approximated wound well healed.

## 2022-03-06 ENCOUNTER — Other Ambulatory Visit (INDEPENDENT_AMBULATORY_CARE_PROVIDER_SITE_OTHER): Payer: Medicare Other

## 2022-03-06 DIAGNOSIS — I10 Essential (primary) hypertension: Secondary | ICD-10-CM | POA: Diagnosis not present

## 2022-03-06 DIAGNOSIS — I4819 Other persistent atrial fibrillation: Secondary | ICD-10-CM

## 2022-03-06 LAB — CBC WITH DIFFERENTIAL/PLATELET
Basophils Absolute: 0 10*3/uL (ref 0.0–0.1)
Basophils Relative: 0.7 % (ref 0.0–3.0)
Eosinophils Absolute: 0.2 10*3/uL (ref 0.0–0.7)
Eosinophils Relative: 2.7 % (ref 0.0–5.0)
HCT: 39 % (ref 36.0–46.0)
Hemoglobin: 12.9 g/dL (ref 12.0–15.0)
Lymphocytes Relative: 15.5 % (ref 12.0–46.0)
Lymphs Abs: 1.1 10*3/uL (ref 0.7–4.0)
MCHC: 33 g/dL (ref 30.0–36.0)
MCV: 94.1 fl (ref 78.0–100.0)
Monocytes Absolute: 0.6 10*3/uL (ref 0.1–1.0)
Monocytes Relative: 7.7 % (ref 3.0–12.0)
Neutro Abs: 5.3 10*3/uL (ref 1.4–7.7)
Neutrophils Relative %: 73.4 % (ref 43.0–77.0)
Platelets: 224 10*3/uL (ref 150.0–400.0)
RBC: 4.14 Mil/uL (ref 3.87–5.11)
RDW: 14.5 % (ref 11.5–15.5)
WBC: 7.2 10*3/uL (ref 4.0–10.5)

## 2022-03-06 LAB — BASIC METABOLIC PANEL
BUN: 23 mg/dL (ref 6–23)
CO2: 27 mEq/L (ref 19–32)
Calcium: 9.8 mg/dL (ref 8.4–10.5)
Chloride: 103 mEq/L (ref 96–112)
Creatinine, Ser: 1.05 mg/dL (ref 0.40–1.20)
GFR: 48.32 mL/min — ABNORMAL LOW (ref >60.00)
Glucose, Bld: 110 mg/dL — ABNORMAL HIGH (ref 70–99)
Potassium: 4.3 mEq/L (ref 3.5–5.1)
Sodium: 140 mEq/L (ref 135–145)

## 2022-03-20 NOTE — Pre-Procedure Instructions (Signed)
Instructed patient on the following items: Arrival time 1430 Nothing to eat or drink after midnight No meds AM of procedure Responsible person to drive you home and stay with you for 24 hrs  Have you missed any doses of anti-coagulant Eliquis- hasn't missed any doses

## 2022-03-23 ENCOUNTER — Other Ambulatory Visit: Payer: Self-pay

## 2022-03-23 ENCOUNTER — Encounter (HOSPITAL_COMMUNITY)
Admission: AD | Disposition: A | Discharge status: Home / Self Care | Payer: Medicare Other | Source: Home / Self Care | Attending: Cardiology

## 2022-03-23 ENCOUNTER — Ambulatory Visit (HOSPITAL_COMMUNITY)
Admission: AD | Admit: 2022-03-23 | Discharge: 2022-03-24 | Disposition: A | Discharge status: Home / Self Care | Payer: Medicare Other | Attending: Cardiology | Admitting: Cardiology

## 2022-03-23 DIAGNOSIS — Z6841 Body Mass Index (BMI) 40.0 and over, adult: Secondary | ICD-10-CM | POA: Diagnosis not present

## 2022-03-23 DIAGNOSIS — G4733 Obstructive sleep apnea (adult) (pediatric): Secondary | ICD-10-CM | POA: Insufficient documentation

## 2022-03-23 DIAGNOSIS — I4821 Permanent atrial fibrillation: Principal | ICD-10-CM | POA: Insufficient documentation

## 2022-03-23 DIAGNOSIS — I4891 Unspecified atrial fibrillation: Principal | ICD-10-CM | POA: Diagnosis present

## 2022-03-23 DIAGNOSIS — Z8719 Personal history of other diseases of the digestive system: Secondary | ICD-10-CM | POA: Insufficient documentation

## 2022-03-23 DIAGNOSIS — I2510 Atherosclerotic heart disease of native coronary artery without angina pectoris: Secondary | ICD-10-CM | POA: Insufficient documentation

## 2022-03-23 DIAGNOSIS — I4892 Unspecified atrial flutter: Secondary | ICD-10-CM | POA: Diagnosis not present

## 2022-03-23 DIAGNOSIS — I482 Chronic atrial fibrillation, unspecified: Secondary | ICD-10-CM | POA: Diagnosis not present

## 2022-03-23 DIAGNOSIS — I110 Hypertensive heart disease with heart failure: Secondary | ICD-10-CM | POA: Diagnosis not present

## 2022-03-23 DIAGNOSIS — I5032 Chronic diastolic (congestive) heart failure: Secondary | ICD-10-CM | POA: Diagnosis not present

## 2022-03-23 DIAGNOSIS — I350 Nonrheumatic aortic (valve) stenosis: Secondary | ICD-10-CM | POA: Diagnosis not present

## 2022-03-23 DIAGNOSIS — E785 Hyperlipidemia, unspecified: Secondary | ICD-10-CM | POA: Insufficient documentation

## 2022-03-23 HISTORY — PX: AV NODE ABLATION: EP1193

## 2022-03-23 SURGERY — AV NODE ABLATION
Anesthesia: LOCAL

## 2022-03-23 MED ORDER — DILTIAZEM HCL ER COATED BEADS 120 MG PO CP24
120.0000 mg | ORAL_CAPSULE | Freq: Every day | ORAL | Status: DC
  Administered 2022-03-24: 120 mg via ORAL
  Filled 2022-03-23: qty 1

## 2022-03-23 MED ORDER — POTASSIUM CHLORIDE ER 10 MEQ PO TBCR
10.0000 meq | EXTENDED_RELEASE_TABLET | Freq: Once | ORAL | Status: AC
  Administered 2022-03-23: 10 meq via ORAL
  Filled 2022-03-23: qty 1

## 2022-03-23 MED ORDER — SODIUM CHLORIDE 0.9% FLUSH
3.0000 mL | Freq: Two times a day (BID) | INTRAVENOUS | Status: DC
  Administered 2022-03-23: 3 mL via INTRAVENOUS

## 2022-03-23 MED ORDER — FENTANYL CITRATE (PF) 100 MCG/2ML IJ SOLN
INTRAMUSCULAR | Status: DC | PRN
  Administered 2022-03-23: 12.5 ug via INTRAVENOUS

## 2022-03-23 MED ORDER — LISINOPRIL 10 MG PO TABS
10.0000 mg | ORAL_TABLET | Freq: Every day | ORAL | Status: DC
  Administered 2022-03-24: 10 mg via ORAL
  Filled 2022-03-23: qty 1

## 2022-03-23 MED ORDER — HEPARIN SODIUM (PORCINE) 1000 UNIT/ML IJ SOLN
INTRAMUSCULAR | Status: DC | PRN
  Administered 2022-03-23: 1000 [IU] via INTRAVENOUS

## 2022-03-23 MED ORDER — ACETAMINOPHEN 325 MG PO TABS
650.0000 mg | ORAL_TABLET | ORAL | Status: DC | PRN
  Administered 2022-03-23 – 2022-03-24 (×2): 650 mg via ORAL
  Filled 2022-03-23 (×2): qty 2

## 2022-03-23 MED ORDER — BUPIVACAINE HCL (PF) 0.25 % IJ SOLN
INTRAMUSCULAR | Status: AC
  Filled 2022-03-23: qty 30

## 2022-03-23 MED ORDER — MIDAZOLAM HCL 5 MG/5ML IJ SOLN
INTRAMUSCULAR | Status: AC
  Filled 2022-03-23: qty 5

## 2022-03-23 MED ORDER — HEPARIN (PORCINE) IN NACL 1000-0.9 UT/500ML-% IV SOLN
INTRAVENOUS | Status: AC
  Filled 2022-03-23: qty 500

## 2022-03-23 MED ORDER — HEPARIN (PORCINE) IN NACL 1000-0.9 UT/500ML-% IV SOLN
INTRAVENOUS | Status: DC | PRN
  Administered 2022-03-23: 500 mL

## 2022-03-23 MED ORDER — BUPIVACAINE HCL (PF) 0.25 % IJ SOLN
INTRAMUSCULAR | Status: DC | PRN
  Administered 2022-03-23: 15 mL

## 2022-03-23 MED ORDER — SODIUM CHLORIDE 0.9 % IV SOLN: 250.0000 mL | INTRAVENOUS | Status: DC | PRN

## 2022-03-23 MED ORDER — APIXABAN 5 MG PO TABS
5.0000 mg | ORAL_TABLET | Freq: Two times a day (BID) | ORAL | Status: DC
  Administered 2022-03-23 – 2022-03-24 (×2): 5 mg via ORAL
  Filled 2022-03-23 (×2): qty 1

## 2022-03-23 MED ORDER — METOPROLOL SUCCINATE ER 25 MG PO TB24
25.0000 mg | ORAL_TABLET | Freq: Two times a day (BID) | ORAL | Status: DC
  Administered 2022-03-24: 25 mg via ORAL
  Filled 2022-03-23: qty 1

## 2022-03-23 MED ORDER — MIDAZOLAM HCL 5 MG/5ML IJ SOLN
INTRAMUSCULAR | Status: DC | PRN
  Administered 2022-03-23: 1 mg via INTRAVENOUS

## 2022-03-23 MED ORDER — FENTANYL CITRATE (PF) 100 MCG/2ML IJ SOLN
INTRAMUSCULAR | Status: AC
  Filled 2022-03-23: qty 2

## 2022-03-23 MED ORDER — SODIUM CHLORIDE 0.9 % IV SOLN: INTRAVENOUS | Status: DC

## 2022-03-23 MED ORDER — SODIUM CHLORIDE 0.9% FLUSH: 3.0000 mL | INTRAVENOUS | Status: DC | PRN

## 2022-03-23 MED ORDER — VANCOMYCIN HCL 1500 MG/300ML IV SOLN
1500.0000 mg | Freq: Once | INTRAVENOUS | Status: AC
  Administered 2022-03-23: 1500 mg via INTRAVENOUS
  Filled 2022-03-23: qty 300

## 2022-03-23 MED ORDER — BUMETANIDE 1 MG PO TABS
1.0000 mg | ORAL_TABLET | Freq: Every day | ORAL | Status: DC
  Administered 2022-03-24: 1 mg via ORAL
  Filled 2022-03-23: qty 1

## 2022-03-23 MED ORDER — ONDANSETRON HCL 4 MG/2ML IJ SOLN: 4.0000 mg | Freq: Four times a day (QID) | INTRAMUSCULAR | Status: DC | PRN

## 2022-03-23 SURGICAL SUPPLY — 8 items
CATH SMTCH THERMOCOOL SF FJ (CATHETERS) ×1 IMPLANT
CLOSURE PERCLOSE PROSTYLE (VASCULAR PRODUCTS) ×1 IMPLANT
PACK EP LATEX FREE (CUSTOM PROCEDURE TRAY) ×1
PACK EP LF (CUSTOM PROCEDURE TRAY) ×1 IMPLANT
PAD DEFIB RADIO PHYSIO CONN (PAD) ×2 IMPLANT
PATCH CARTO3 (PAD) ×1 IMPLANT
SHEATH PINNACLE 8F 10CM (SHEATH) ×1 IMPLANT
TUBING SMART ABLATE COOLFLOW (TUBING) ×1 IMPLANT

## 2022-03-23 NOTE — H&P (Signed)
Electrophysiology Office Note:     Date:  03/23/2022    ID:  Tracy Miles, DOB 03-02-1934, MRN [id number]   PCP:  [name], MD  [facility] Cardiologist:  None  [facility] Electrophysiologist:  Vickie Epley, MD    Referring MD: [name], MD    Chief Complaint: ILR MDT    History of Present Illness:     Tracy Miles is a 88 y.o. female who presents for follow-up of ILR MDT device. Their medical history includes persistent atrial fibrillation, aortic stenosis, CAD, DVT, hypertension, hyperlipidemia, anemia, vertigo, obesity, OSA on CPAP, and pancreatitis. Previously followed by Dr. Rayann Heman.   She is s/p Afib ablation 04/25/2019. Required CV right after ablation, but has had progressive increase in her Afib burden.   She last followed up with [name], NP on 12/17/2021 where she was feeling very fatigued. She believed that the higher doses of her medications to control v rates were contributing to her fatigue. At that visit she was in atrial flutter, reasonably controlled. It was noted that her Afib burden had increased over the 3 months prior, Tikosyn has become ineffective. Previously, she spoke with Dr. Rayann Heman who did not recommend a repeat ablation. She is unable to take amiodarone due to ILD followed by pulmonology.   Overall,   She denies any palpitations, chest pain, shortness of breath, or peripheral edema. No lightheadedness, headaches, syncope, orthopnea, or PND.   Presents for AVJ ablation today. Procedure reviewed.      Objective      Past Medical History:  Diagnosis Date   Anemia     Aortic stenosis      Very mild   Benign paroxysmal positional vertigo 12/17/2013   Chicken pox as a child   Colon polyp     Coronary artery disease     DDD (degenerative disc disease) 11/16/2013   Hyperlipidemia     Hypertension     Iron deficiency anemia 11/13/2013   Mumps child and teenager   OA (osteoarthritis) 11/19/2013    S/p L TKR   Obesity      OSA on CPAP     Pancreatitis      post hysterectomy   Persistent atrial fibrillation (HCC)     Personal history of colonic polyps 02/25/2014   Personal history of DVT (deep vein thrombosis) X 2    "left"   Sleep apnea     Spinal stenosis             Past Surgical History:  Procedure Laterality Date   APPENDECTOMY       ATRIAL FIBRILLATION ABLATION N/A 04/25/2019    Procedure: ATRIAL FIBRILLATION ABLATION;  Surgeon: [name], MD;  Location: [place] [facility];  Service: Cardiovascular;  Laterality: N/A;   CARDIOVERSION N/A 08/09/2015    Procedure: CARDIOVERSION;  Surgeon: Jerline Pain, MD;  Location: [place];  Service: Cardiovascular;  Laterality: N/A;   CARDIOVERSION N/A 09/30/2018    Procedure: CARDIOVERSION;  Surgeon: [name], MD;  Location: [facility] ENDOSCOPY;  Service: Cardiovascular;  Laterality: N/A;   CARDIOVERSION N/A 02/27/2019    Procedure: CARDIOVERSION;  Surgeon: Buford Dresser, MD;  Location: [facility] ENDOSCOPY;  Service: Cardiovascular;  Laterality: N/A;   CARDIOVERSION N/A 06/07/2019    Procedure: CARDIOVERSION;  Surgeon: [name], MD;  Location: [facility] ENDOSCOPY;  Service: Cardiovascular;  Laterality: N/A;   CATARACT EXTRACTION W/ INTRAOCULAR LENS  IMPLANT, BILATERAL Bilateral 2016   CYST REMOVAL HAND Bilateral 1990's    "  played to much golf"   DILATION AND CURETTAGE OF UTERUS       implantable loop recorder placement   11/20/2019    Medtronic Reveal Linq model LNQ 22 (model number [id number]) implanted in office by Dr Rayann Heman for afib management   LUMBAR LAMINECTOMY   40 yrs ago    "L3-4"   MENISCUS REPAIR Bilateral 2000 and 2010   REPLACEMENT TOTAL KNEE Left 2013   TEE WITHOUT CARDIOVERSION N/A 04/24/2019    Procedure: TRANSESOPHAGEAL ECHOCARDIOGRAM (TEE);  Surgeon: Acie Fredrickson Wonda Cheng, MD;  Location: [facility] ENDOSCOPY;  Service: Cardiovascular;  Laterality: N/A;   TONSILECTOMY, ADENOIDECTOMY, BILATERAL MYRINGOTOMY AND TUBES   child   TOTAL ABDOMINAL  HYSTERECTOMY   1995    had 2 tumors- benign   TUBAL LIGATION   88 years old      Current Medications: Active Medications      Current Meds  Medication Sig   acetaminophen (TYLENOL) 325 MG tablet Take 325 mg by mouth daily as needed (pain.).   atorvastatin (LIPITOR) 20 MG tablet TAKE 1 TABLET BY MOUTH  DAILY   bumetanide (BUMEX) 1 MG tablet TAKE 1 TABLET BY MOUTH DAILY   Calcium Carb-Cholecalciferol (CALCIUM 600 + D PO) Take 1 tablet by mouth 2 (two) times daily.   Carboxymethylcellul-Glycerin (LUBRICATING EYE DROPS OP) Place 1 drop into both eyes in the morning and at bedtime.   clindamycin (CLEOCIN) 300 MG capsule As needed for dental work   diltiazem (CARDIZEM CD) 120 MG 24 hr capsule TAKE ONE (1) CAPSULE EACH DAY BY MOUTH   dofetilide (TIKOSYN) 250 MCG capsule Take 1 capsule (250 mcg total) by mouth 2 (two) times daily.   ELIQUIS 5 MG TABS tablet TAKE ONE TABLET EVERY MORNING AND AT BEDTIME   lisinopril (ZESTRIL) 10 MG tablet Take 1 tablet (10 mg total) by mouth daily.   loratadine (CLARITIN) 10 MG tablet Take 10 mg by mouth daily.    metoprolol succinate (TOPROL-XL) 50 MG 24 hr tablet Take 1 tablet (50 mg total) by mouth 2 (two) times daily.   Multiple Vitamins-Minerals (ONE-A-DAY WOMENS 50 PLUS PO) Take 1 tablet by mouth daily.   potassium chloride (KLOR-CON) 10 MEQ tablet TAKE 1 TABLET BY MOUTH IN  THE MORNING AND 2 TABLETS  BY MOUTH IN THE EVENING   Probiotic CAPS Take 1 capsule by mouth daily.   psyllium (REGULOID) 0.52 g capsule Take 0.52 g by mouth 2 (two) times daily.         Allergies:   Gabapentin, Lactose intolerance (gi), Zebeta [bisoprolol fumarate], Penicillins, and Sulfa antibiotics    Social History         Socioeconomic History   Marital status: Widowed      Spouse name: Not on file   Number of children: 3   Years of education: Not on file   Highest education level: Not on file  Occupational History   Occupation: retired Pharmacist, hospital  Tobacco Use   Smoking  status: Never   Smokeless tobacco: Never   Tobacco comments:      never used tobacco  Substance and Sexual Activity   Alcohol use: No   Drug use: No   Sexual activity: Never      Comment: lives at [place], low sodium diet  Other Topics Concern   Not on file  Social History Narrative   Not on file    Social Determinants of Health       Financial Resource Strain: Low Risk  Difficulty of Paying Living Expenses: Not hard at all  Food Insecurity: No Food Insecurity   Worried About [place] in the Last Year: Never true   Ran Out of Food in the Last Year: Never true  Transportation Needs: No Transportation Needs   Lack of Transportation (Medical): No   Lack of Transportation (Non-Medical): No  Physical Activity: Sufficiently Active   Days of Exercise per Week: 3 days   Minutes of Exercise per Session: 60 min  Stress: No Stress Concern Present   Feeling of Stress : Not at all  Social Connections: Moderately Integrated   Frequency of Communication with Friends and Family: More than three times a week   Frequency of Social Gatherings with Friends and Family: More than three times a week   Attends Religious Services: More than 4 times per year   Active Member of [employer] or Organizations: Yes   Attends Archivist Meetings: 1 to 4 times per year   Marital Status: Widowed      Family History: The patient's family history includes Atrial fibrillation in her son; COPD in her father; Cancer in her maternal grandfather and paternal grandmother; Cancer (age of onset: 33) in her son; Cancer (age of onset: 63) in her father; Dementia in her mother; Diabetes in her maternal grandmother; Gout in her son and son; Heart attack (age of onset: 15) in her mother; Heart disease in her brother; Hyperlipidemia in her brother, mother, son, and son; Hypertension in her brother; Pernicious anemia in her maternal grandmother and mother; Sleep apnea in her son; Stroke in her paternal  grandfather. There is no history of Colon cancer, Pancreatic cancer, Stomach cancer, Throat cancer, or Liver disease.   ROS:   Please see the history of present illness.      All other systems reviewed and are negative.   EKGs/Labs/Other Studies Reviewed:     The following studies were reviewed today:        EKG:   EKG is personally reviewed.        Recent Labs: 09/22/2021: Hemoglobin 12.8; Platelets 242.0; TSH 2.67 10/23/2021: ALT 13; BUN 17; Creatinine, Ser 1.08; Magnesium 2.1; Potassium 4.0; Sodium 139    Recent Lipid Panel Labs (Brief)          Component Value Date/Time    CHOL 153 09/22/2021 1448    CHOL 138 04/20/2019 1345    TRIG 165.0 (H) 09/22/2021 1448    HDL 41.80 09/22/2021 1448    HDL 41 04/20/2019 1345    CHOLHDL 4 09/22/2021 1448    VLDL 33.0 09/22/2021 1448    LDLCALC 78 09/22/2021 1448    LDLCALC 85 05/14/2020 1413        Physical Exam:     VS:  BP 130/72   Pulse (!) 108   Ht '5\' 3"'$  (1.6 m)   Wt 277 lb (125.6 kg)   SpO2 97%   BMI 49.07 kg/m         Wt Readings from Last 3 Encounters:  01/08/22 277 lb (125.6 kg)  12/17/21 278 lb 3.2 oz (126.2 kg)  11/21/21 284 lb (128.8 kg)      GEN: Well nourished, well developed in no acute distress. obese HEENT: Normal NECK: No JVD; No carotid bruits LYMPHATICS: No lymphadenopathy CARDIAC: irregularly irregular, no murmurs, rubs, gallops;  RESPIRATORY:  Clear to auscultation without rales, wheezing or rhonchi  ABDOMEN: Soft, non-tender, non-distended MUSCULOSKELETAL:  No edema; No deformity  SKIN: Warm and dry  NEUROLOGIC:  Alert and oriented x 3 PSYCHIATRIC:  Normal affect          Assessment ASSESSMENT:     1. Persistent atrial fibrillation ([place])   2. Atrial flutter, unspecified type ([place])   3. Chronic diastolic HF (heart failure) ([place])   4. Morbid obesity ([place])     PLAN:     In order of problems listed above:     #Persistent Atrial Fibrillation #Persistent Atrial Flutter   [place],  and alternatives to ablation of the av node were also discussed in detail today. These risks include but are not limited to stroke, bleeding, vascular damage, tamponade, perforation, worsening renal function, and death. Carto is requested for the procedure.      Presents for AVJ ablation today.     Signed, Hilton Cork. Quentin Ore, MD, [facility], [facility] 03/23/2022  Electrophysiology [facility]

## 2022-03-23 NOTE — Progress Notes (Addendum)
Pt brought to cath lab holding, [place] by Jeneen Rinks, [place], connected to monitor, 2C-03 changed to ready bed, right groin remains soft with drsg intact, gauze with tegaderm in place, safety maintained, transferred to 2C-03 at 1644

## 2022-03-24 ENCOUNTER — Encounter (HOSPITAL_COMMUNITY): Payer: Self-pay | Admitting: Cardiology

## 2022-03-24 DIAGNOSIS — I4821 Permanent atrial fibrillation: Secondary | ICD-10-CM | POA: Diagnosis not present

## 2022-03-24 DIAGNOSIS — G4733 Obstructive sleep apnea (adult) (pediatric): Secondary | ICD-10-CM | POA: Diagnosis not present

## 2022-03-24 DIAGNOSIS — I110 Hypertensive heart disease with heart failure: Secondary | ICD-10-CM | POA: Diagnosis not present

## 2022-03-24 DIAGNOSIS — E785 Hyperlipidemia, unspecified: Secondary | ICD-10-CM | POA: Diagnosis not present

## 2022-03-24 DIAGNOSIS — Z6841 Body Mass Index (BMI) 40.0 and over, adult: Secondary | ICD-10-CM | POA: Diagnosis not present

## 2022-03-24 DIAGNOSIS — I5032 Chronic diastolic (congestive) heart failure: Secondary | ICD-10-CM | POA: Diagnosis not present

## 2022-03-24 DIAGNOSIS — I2510 Atherosclerotic heart disease of native coronary artery without angina pectoris: Secondary | ICD-10-CM | POA: Diagnosis not present

## 2022-03-24 DIAGNOSIS — I350 Nonrheumatic aortic (valve) stenosis: Secondary | ICD-10-CM | POA: Diagnosis not present

## 2022-03-24 DIAGNOSIS — I4892 Unspecified atrial flutter: Secondary | ICD-10-CM | POA: Diagnosis not present

## 2022-03-24 DIAGNOSIS — Z8719 Personal history of other diseases of the digestive system: Secondary | ICD-10-CM | POA: Diagnosis not present

## 2022-03-24 MED ORDER — METOPROLOL SUCCINATE ER 25 MG PO TB24: 25.0000 mg | ORAL_TABLET | Freq: Every day | ORAL | 6 refills | Status: DC

## 2022-03-24 NOTE — Discharge Summary (Signed)
ELECTROPHYSIOLOGY PROCEDURE DISCHARGE SUMMARY    Patient ID: Tracy Miles,  MRN: [id number], DOB/AGE: [birth year] 86 y.o.  Admit date: 03/23/2022 Discharge date: 03/24/2022  Primary Care Physician: [name], MD  Primary Cardiologist: None  Electrophysiologist: Dr. Quentin Ore  Primary Discharge Diagnosis:  Persistent Atrial Fibrillation Atrial flutter  Secondary Discharge Diagnosis:  Chronic diastolic CHF  Procedures This Admission:  1.  Electrophysiology study and radiofrequency catheter ablation of AV node in setting of poorly controlled Atrial Fibrillation on 03/23/2022 by Dr. Quentin Ore.  This study demonstrated AF with RVR upon presentation and successful AV nodal ablation with no early apparent complications.    Brief HPI: Tracy Miles is a 88 y.o. female with a history of Atrial Fibrillation.  They have failed medical therapy with tikosyn. Risks, benefits, and alternatives to catheter ablation of  AV node  were reviewed with the patient who wished to proceed.   Pt had previously underwent PPM implantation 02/06/2022  Hospital Course:  The patient was admitted and underwent EPS/RFCA of AV nodal ablation in the setting of poorly controlled Atrial Fibrillation despite tikosyn with details as outlined above.  They were monitored on telemetry overnight which demonstrated V pacing with underlying AF.  Groin was without complication on the day of discharge.  The patient was examined and considered to be stable for discharge.  Wound care and restrictions were reviewed with the patient.  The patient will be seen back by EP APP in 2-3 weeks for post ablation follow up and PPM adjustment.  CHA2DS2VASC is at least 5.   Physical Exam: Vitals:   03/23/22 2329 03/24/22 0300 03/24/22 0432 03/24/22 0755  BP: 127/80  117/77 (!) 150/99  Pulse: 91 90 90 90  Resp: 17 19 (!) 23 19  Temp: 98.2 F (36.8 C)  98.4 F (36.9 C) 98.3 F (36.8 C)  TempSrc: Oral  Oral Oral  SpO2: 93% 97%  96% 95%  Weight:      Height:        GEN- The patient is well appearing, alert and oriented x 3 today.   HEENT: normocephalic, atraumatic; sclera clear, conjunctiva pink; hearing intact; oropharynx clear; neck supple  Lungs- Clear to ausculation bilaterally, normal work of breathing.  No wheezes, rales, rhonchi Heart- Regular rate and rhythm, no murmurs, rubs or gallops  GI- soft, non-tender, non-distended, bowel sounds present  Extremities- no clubbing, cyanosis, or edema; DP/PT/radial pulses 2+ bilaterally, groin without hematoma/bruit MS- no significant deformity or atrophy Skin- warm and dry, no rash or lesion Psych- euthymic mood, full affect Neuro- strength and sensation are intact  Labs:   Lab Results  Component Value Date   WBC 7.2 03/06/2022   HGB 12.9 03/06/2022   HCT 39.0 03/06/2022   MCV 94.1 03/06/2022   PLT 224.0 03/06/2022   No results for input(s): "NA", "K", "CL", "CO2", "BUN", "CREATININE", "CALCIUM", "PROT", "BILITOT", "ALKPHOS", "ALT", "AST", "GLUCOSE" in the last 168 hours.  Invalid input(s): "LABALBU"   Discharge Medications:  Allergies as of 03/24/2022       Reactions   Gabapentin Swelling   Eyes and Face   Lactose Intolerance (gi) Other (See Comments)   Bothers her stomach   Zebeta [bisoprolol Fumarate] Nausea Only   Penicillins Hives, Rash   Did it involve swelling of the face/tongue/throat, SOB, or low BP? No Did it involve sudden or severe rash/hives, skin peeling, or any reaction on the inside of your mouth or nose? Yes Did you need to seek medical attention at  a hospital or doctor's office? No When did it last happen?       25+ years If all above answers are "NO", may proceed with cephalosporin use. Hives in throat    Sulfa Antibiotics Nausea Only, Rash   Vancomycin Itching        Medication List     STOP taking these medications    diltiazem 120 MG 24 hr capsule Commonly known as: CARDIZEM CD   diltiazem 30 MG tablet Commonly  known as: Cardizem       TAKE these medications    acetaminophen 325 MG tablet Commonly known as: TYLENOL Take 325 mg by mouth every morning.   atorvastatin 20 MG tablet Commonly known as: LIPITOR TAKE 1 TABLET BY MOUTH  DAILY What changed: when to take this   bumetanide 1 MG tablet Commonly known as: BUMEX TAKE 1 TABLET BY MOUTH DAILY   CALCIUM 600 + D PO Take 1 tablet by mouth 2 (two) times daily.   clindamycin 300 MG capsule Commonly known as: CLEOCIN Take 1,200 mg by mouth See admin instructions. Before dental work   Eliquis 5 MG Tabs tablet Generic drug: apixaban TAKE ONE TABLET EVERY MORNING AND AT BEDTIME   lisinopril 10 MG tablet Commonly known as: ZESTRIL Take 1 tablet (10 mg total) by mouth daily.   loratadine 10 MG tablet Commonly known as: CLARITIN Take 10 mg by mouth daily.   LUBRICATING EYE DROPS OP Place 1 drop into both eyes in the morning and at bedtime.   metoprolol succinate 25 MG 24 hr tablet Commonly known as: TOPROL-XL Take 1 tablet (25 mg total) by mouth daily. What changed:  medication strength how much to take when to take this   Moderna COVID-19 Bival Booster 50 MCG/0.5ML injection Generic drug: COVID-19 mRNA bivalent vaccine (Moderna) Inject into the muscle.   ONE-A-DAY WOMENS 50 PLUS PO Take 1 tablet by mouth daily.   potassium chloride 10 MEQ tablet Commonly known as: KLOR-CON TAKE 1 TABLET BY MOUTH IN  THE MORNING AND 2 TABLETS  BY MOUTH IN THE EVENING   Probiotic Caps Take 1 capsule by mouth daily. Gummy   psyllium 0.52 g capsule Commonly known as: REGULOID Take 0.52 g by mouth 2 (two) times daily.        Disposition:    Follow-up Information     [facility] Follow up.   Why: on 8/14 at 1120 for post AV nodal ablation follow up Contact information: [place] [id number] [phone]                Duration of  Discharge Encounter: Greater than 30 minutes including physician time.  Jacalyn Lefevre, PA-C  03/24/2022 8:57 AM

## 2022-03-24 NOTE — Plan of Care (Signed)

## 2022-03-24 NOTE — Discharge Instructions (Signed)

## 2022-03-30 ENCOUNTER — Ambulatory Visit (INDEPENDENT_AMBULATORY_CARE_PROVIDER_SITE_OTHER): Payer: Medicare Other | Admitting: Family

## 2022-03-30 DIAGNOSIS — T7840XD Allergy, unspecified, subsequent encounter: Secondary | ICD-10-CM | POA: Diagnosis not present

## 2022-03-30 DIAGNOSIS — G4733 Obstructive sleep apnea (adult) (pediatric): Secondary | ICD-10-CM | POA: Diagnosis not present

## 2022-03-30 DIAGNOSIS — D509 Iron deficiency anemia, unspecified: Secondary | ICD-10-CM

## 2022-03-30 DIAGNOSIS — I480 Paroxysmal atrial fibrillation: Secondary | ICD-10-CM

## 2022-03-30 DIAGNOSIS — R739 Hyperglycemia, unspecified: Secondary | ICD-10-CM | POA: Diagnosis not present

## 2022-03-30 DIAGNOSIS — I10 Essential (primary) hypertension: Secondary | ICD-10-CM

## 2022-03-30 NOTE — Assessment & Plan Note (Signed)
S/p ablation. Rate stable, continues Eliquis, has follow up planned with EP.

## 2022-03-30 NOTE — Assessment & Plan Note (Signed)
Lab Results  Component Value Date   HGBA1C 6.0 01/20/2022   Discussed DM diet.

## 2022-03-30 NOTE — Assessment & Plan Note (Addendum)
Lab Results  Component Value Date   WBC 7.2 03/06/2022   HGB 12.9 03/06/2022   HCT 39.0 03/06/2022   MCV 94.1 03/06/2022   PLT 224.0 03/06/2022   Blood count stable.

## 2022-03-30 NOTE — Assessment & Plan Note (Signed)
BP Readings from Last 3 Encounters:  03/30/22 (!) 148/75  03/24/22 (!) 150/99  02/08/22 (!) 138/94   Fair bp- will defer med changes to cardiology.

## 2022-03-30 NOTE — Assessment & Plan Note (Signed)
Stable on CPAP.  Continue same.

## 2022-03-30 NOTE — Assessment & Plan Note (Signed)
Continues daily claritin.

## 2022-03-30 NOTE — Progress Notes (Signed)
Subjective:   By signing my name below, I, Shehryar Baig, attest that this documentation has been prepared under the direction and in the presence of Debbrah Alar, NP 03/30/2022    Patient ID: Tracy Miles, female    DOB: 19-Jul-1934, 88 y.o.   MRN: [id number]  Chief Complaint  Patient presents with   Follow-up    Here for hospital follow up    HPI Patient is in today for a hospital follow up.   She was admitted to the hospital on 03/23/2022 for persistent atrial fibrillation and atrial flutter. She was discharged on 03/24/2022. She was diagnosed with chronic diastolic CHF while in the hospital. While in the hospital she has an  Electrophysiology study and radiofrequency catheter ablation of AV node in setting of poorly controlled Atrial Fibrillation on 03/23/2022 by Dr. Quentin Ore.  This study demonstrated AF with RVR upon presentation and successful AV nodal ablation with no early apparent complications. Her diltiazem was stopped while in the hospital.   She reports her heart rate was averaging around 140 bpm while in the hospital. She notes it has been reduced to 90 bpm since her discharge. She continues taking eliquis regularly. She denies having any chest palpitations or dizziness. She has mild pain in her shoulder blades and back but thinks it is due to laying down for long periods in the hospital.  Her blood pressure is elevated during this visit.  BP Readings from Last 3 Encounters:  03/30/22 (!) 148/75  03/24/22 (!) 150/99  02/08/22 (!) 138/94   Pulse Readings from Last 3 Encounters:  03/30/22 91  03/24/22 90  02/08/22 (!) 115   Leg swelling- She continues having leg swelling and reports recently her legs have been swelling more frequently. She is elevating her legs regularly and is taking 1 mg Bumex daily PO.  A1c- Her a1c was borderline during her last visit. She is managing it with her diet at this time.  CPAP- She continues wearing her CPAP and reports doing well  while wearing it.  Allergies- She continues taking Claritin daily PO and reports no new issues while taking it.    There are no preventive care reminders to display for this patient.  Past Medical History:  Diagnosis Date   Anemia    Aortic stenosis    Very mild   Benign paroxysmal positional vertigo 12/17/2013   Chicken pox as a child   Colon polyp    Coronary artery disease    DDD (degenerative disc disease) 11/16/2013   Hyperlipidemia    Hypertension    Iron deficiency anemia 11/13/2013   Mumps child and teenager   OA (osteoarthritis) 11/19/2013   S/p L TKR   Obesity    OSA on CPAP    Pancreatitis    post hysterectomy   Persistent atrial fibrillation (HCC)    Personal history of colonic polyps 02/25/2014   Personal history of DVT (deep vein thrombosis) X 2   "left"   Sleep apnea    Spinal stenosis     Past Surgical History:  Procedure Laterality Date   APPENDECTOMY     ATRIAL FIBRILLATION ABLATION N/A 04/25/2019   Procedure: ATRIAL FIBRILLATION ABLATION;  Surgeon: [name], MD;  Location: [place] [facility];  Service: Cardiovascular;  Laterality: N/A;   AV NODE ABLATION N/A 03/23/2022   Procedure: AV NODE ABLATION;  Surgeon: Vickie Epley, MD;  Location: [place] [facility];  Service: Cardiovascular;  Laterality: N/A;   CARDIOVERSION N/A 08/09/2015  Procedure: CARDIOVERSION;  Surgeon: Jerline Pain, MD;  Location: [place];  Service: Cardiovascular;  Laterality: N/A;   CARDIOVERSION N/A 09/30/2018   Procedure: CARDIOVERSION;  Surgeon: [name], MD;  Location: [facility] ENDOSCOPY;  Service: Cardiovascular;  Laterality: N/A;   CARDIOVERSION N/A 02/27/2019   Procedure: CARDIOVERSION;  Surgeon: Buford Dresser, MD;  Location: [facility] ENDOSCOPY;  Service: Cardiovascular;  Laterality: N/A;   CARDIOVERSION N/A 06/07/2019   Procedure: CARDIOVERSION;  Surgeon: [name], MD;  Location: [facility] ENDOSCOPY;  Service: Cardiovascular;  Laterality: N/A;    CATARACT EXTRACTION W/ INTRAOCULAR LENS  IMPLANT, BILATERAL Bilateral 2016   CYST REMOVAL HAND Bilateral 1990's   "played to much golf"   DILATION AND CURETTAGE OF UTERUS     implantable loop recorder placement  11/20/2019   Medtronic Reveal Linq model UXL 24 (model number [id number]) implanted in office by Dr Rayann Heman for afib management   LUMBAR LAMINECTOMY  40 yrs ago   "L3-4"   MENISCUS REPAIR Bilateral 2000 and 2010   PACEMAKER IMPLANT N/A 02/06/2022   Procedure: PACEMAKER IMPLANT;  Surgeon: Vickie Epley, MD;  Location: [place] [facility];  Service: Cardiovascular;  Laterality: N/A;   PACEMAKER INSERTION     REPLACEMENT TOTAL KNEE Left 2013   TEE WITHOUT CARDIOVERSION N/A 04/24/2019   Procedure: TRANSESOPHAGEAL ECHOCARDIOGRAM (TEE);  Surgeon: Acie Fredrickson Wonda Cheng, MD;  Location: [facility] ENDOSCOPY;  Service: Cardiovascular;  Laterality: N/A;   TONSILECTOMY, ADENOIDECTOMY, BILATERAL MYRINGOTOMY AND TUBES  child   TOTAL ABDOMINAL HYSTERECTOMY  1995   had 2 tumors- benign   TUBAL LIGATION  88 years old    Family History  Problem Relation Age of Onset   Heart attack Mother 101   Hyperlipidemia Mother        ?   Dementia Mother    Pernicious anemia Mother    COPD Father        smoker   Cancer Father 22       prostate   Heart disease Brother        quadruple bipass surgery   Hyperlipidemia Brother    Hypertension Brother    Diabetes Maternal Grandmother        type 2   Pernicious anemia Maternal Grandmother    Gout Son    Atrial fibrillation Son    Hyperlipidemia Son    Cancer Maternal Grandfather        liver   Cancer Paternal Grandmother        lung- doesn't think she smokes?   Stroke Paternal Grandfather    Cancer Son 15       non hodgin's lymphoma   Gout Son    Hyperlipidemia Son    Sleep apnea Son    Colon cancer Neg Hx    Pancreatic cancer Neg Hx    Stomach cancer Neg Hx    Throat cancer Neg Hx    Liver disease Neg Hx     Social History   Socioeconomic History    Marital status: Widowed    Spouse name: Not on file   Number of children: 3   Years of education: Not on file   Highest education level: Not on file  Occupational History   Occupation: retired Pharmacist, hospital  Tobacco Use   Smoking status: Never   Smokeless tobacco: Never   Tobacco comments:    never used tobacco  Substance and Sexual Activity   Alcohol use: No   Drug use: No   Sexual activity: Never  Comment: [place], low sodium diet  Other Topics Concern   Not on file  Social History Narrative   Not on file   Social Determinants of Health   Financial Resource Strain: Low Risk  (06/19/2021)   Overall Financial Resource Strain (CARDIA)    Difficulty of Paying Living Expenses: Not hard at all  Food Insecurity: No Food Insecurity (06/19/2021)   Hunger Vital Sign    Worried About Running Out of Food in the Last Year: Never true    Ran Out of Food in the Last Year: Never true  Transportation Needs: No Transportation Needs (06/19/2021)   PRAPARE - Hydrologist (Medical): No    Lack of Transportation (Non-Medical): No  Physical Activity: Sufficiently Active (06/19/2021)   Exercise Vital Sign    Days of Exercise per Week: 3 days    Minutes of Exercise per Session: 60 min  Stress: No Stress Concern Present (06/19/2021)   [place]    Feeling of Stress : Not at all  Social Connections: Moderately Integrated (06/19/2021)   Social Connection and Isolation Panel [NHANES]    Frequency of Communication with Friends and Family: More than three times a week    Frequency of Social Gatherings with Friends and Family: More than three times a week    Attends Religious Services: More than 4 times per year    Active Member of [employer] or Organizations: Yes    Attends Archivist Meetings: 1 to 4 times per year    Marital Status: Widowed  Intimate Partner Violence: Not At Risk  (06/19/2021)   Humiliation, Afraid, Rape, and Kick questionnaire    Fear of Current or Ex-Partner: No    Emotionally Abused: No    Physically Abused: No    Sexually Abused: No    Outpatient Medications Prior to Visit  Medication Sig Dispense Refill   acetaminophen (TYLENOL) 325 MG tablet Take 325 mg by mouth every morning.     atorvastatin (LIPITOR) 20 MG tablet TAKE 1 TABLET BY MOUTH  DAILY (Patient taking differently: Take 20 mg by mouth every evening.) 90 tablet 3   bumetanide (BUMEX) 1 MG tablet TAKE 1 TABLET BY MOUTH DAILY 90 tablet 1   Calcium Carb-Cholecalciferol (CALCIUM 600 + D PO) Take 1 tablet by mouth 2 (two) times daily.     Carboxymethylcellul-Glycerin (LUBRICATING EYE DROPS OP) Place 1 drop into both eyes in the morning and at bedtime.     clindamycin (CLEOCIN) 300 MG capsule Take 1,200 mg by mouth See admin instructions. Before dental work     ELIQUIS 5 MG TABS tablet TAKE ONE TABLET EVERY MORNING AND AT BEDTIME 180 tablet 1   lisinopril (ZESTRIL) 10 MG tablet Take 1 tablet (10 mg total) by mouth daily. 90 tablet 1   loratadine (CLARITIN) 10 MG tablet Take 10 mg by mouth daily.      metoprolol succinate (TOPROL-XL) 25 MG 24 hr tablet Take 1 tablet (25 mg total) by mouth daily. 30 tablet 6   Multiple Vitamins-Minerals (ONE-A-DAY WOMENS 50 PLUS PO) Take 1 tablet by mouth daily.     potassium chloride (KLOR-CON) 10 MEQ tablet TAKE 1 TABLET BY MOUTH IN  THE MORNING AND 2 TABLETS  BY MOUTH IN THE EVENING 270 tablet 3   Probiotic CAPS Take 1 capsule by mouth daily. Gummy     psyllium (REGULOID) 0.52 g capsule Take 0.52 g by mouth 2 (  two) times daily.      COVID-19 mRNA bivalent vaccine, Moderna, (MODERNA COVID-19 BIVAL BOOSTER) 50 MCG/0.5ML injection Inject into the muscle. 0.5 mL 0   No facility-administered medications prior to visit.    Allergies  Allergen Reactions   Gabapentin Swelling    Eyes and Face   Lactose Intolerance (Gi) Other (See Comments)    Bothers her  stomach   Zebeta [Bisoprolol Fumarate] Nausea Only   Penicillins Hives and Rash    Did it involve swelling of the face/tongue/throat, SOB, or low BP? No Did it involve sudden or severe rash/hives, skin peeling, or any reaction on the inside of your mouth or nose? Yes Did you need to seek medical attention at a hospital or doctor's office? No When did it last happen?       25+ years If all above answers are "NO", may proceed with cephalosporin use.  Hives in throat     Sulfa Antibiotics Nausea Only and Rash   Vancomycin Itching    ROS See HPI    Objective:    Physical Exam Constitutional:      General: She is not in acute distress.    Appearance: Normal appearance. She is not ill-appearing.  HENT:     Head: Normocephalic and atraumatic.     Right Ear: External ear normal.     Left Ear: External ear normal.  Eyes:     Extraocular Movements: Extraocular movements intact.     Pupils: Pupils are equal, round, and reactive to light.  Cardiovascular:     Rate and Rhythm: Normal rate and regular rhythm.     Heart sounds: Normal heart sounds. No murmur heard.    No gallop.  Pulmonary:     Effort: Pulmonary effort is normal. No respiratory distress.     Breath sounds: Normal breath sounds. No wheezing or rales.  Skin:    General: Skin is warm and dry.  Neurological:     Mental Status: She is alert and oriented to person, place, and time.  Psychiatric:        Judgment: Judgment normal.     BP (!) 148/75 (BP Location: Right Arm, Patient Position: Sitting, Cuff Size: Small)   Pulse 91   Temp 98.4 F (36.9 C) (Oral)   Resp 16   Wt 272 lb (123.4 kg)   SpO2 98%   BMI 48.18 kg/m  Wt Readings from Last 3 Encounters:  03/30/22 272 lb (123.4 kg)  03/23/22 281 lb 11.2 oz (127.8 kg)  02/08/22 275 lb (124.7 kg)       Assessment & Plan:   Problem List Items Addressed This Visit       Unprioritized   Obstructive sleep apnea    Stable on CPAP.  Continue same.       Iron  deficiency anemia    Lab Results  Component Value Date   WBC 7.2 03/06/2022   HGB 12.9 03/06/2022   HCT 39.0 03/06/2022   MCV 94.1 03/06/2022   PLT 224.0 03/06/2022  Blood count stable.       Hyperglycemia    Lab Results  Component Value Date   HGBA1C 6.0 01/20/2022  Discussed DM diet.       Essential hypertension    BP Readings from Last 3 Encounters:  03/30/22 (!) 148/75  03/24/22 (!) 150/99  02/08/22 (!) 138/94  Fair bp- will defer med changes to cardiology.       Atrial fibrillation ([place])    S/p ablation.  Rate stable, continues Eliquis, has follow up planned with EP.      Allergy    Continues daily claritin.       33 minutes spent on today's visit. The majority of the time was spent interviewing patient and reviewing hospital records.   No orders of the defined types were placed in this encounter.   I, Nance Pear, NP, personally preformed the services described in this documentation.  All medical record entries made by the scribe were at my direction and in my presence.  I have reviewed the chart and discharge instructions (if applicable) and agree that the record reflects my personal performance and is accurate and complete. 03/30/2022   I,Shehryar Baig,acting as a Education administrator for Nance Pear, NP.,have documented all relevant documentation on the behalf of Nance Pear, NP,as directed by  Nance Pear, NP while in the presence of Nance Pear, NP.   Nance Pear, NP

## 2022-04-06 NOTE — Progress Notes (Signed)
Electrophysiology Office Note Date: 04/13/2022  ID:  Tracy Miles, DOB [birth year], MRN [id number]  PCP: [name], MD Primary Cardiologist: None Electrophysiologist: Vickie Epley, MD   CC: Pacemaker follow-up  Tracy Miles is a 88 y.o. female seen today for Vickie Epley, MD for post hospital follow up.  Since discharge from hospital the patient reports doing great! She is back in the pool. Started at 20-30 minutes and now back up to an hour.  she denies chest pain, palpitations, dyspnea, PND, orthopnea, nausea, vomiting, dizziness, syncope, edema, weight gain, or early satiety.  Device History: St. Jude Single Chamber PPM implanted 02/06/2022 for Permanent AF, tachy brady AV nodal ablation 03/23/2022  Past Medical History:  Diagnosis Date   Anemia    Aortic stenosis    Very mild   Benign paroxysmal positional vertigo 12/17/2013   Chicken pox as a child   Colon polyp    Coronary artery disease    DDD (degenerative disc disease) 11/16/2013   Hyperlipidemia    Hypertension    Iron deficiency anemia 11/13/2013   Mumps child and teenager   OA (osteoarthritis) 11/19/2013   S/p L TKR   Obesity    OSA on CPAP    Pancreatitis    post hysterectomy   Persistent atrial fibrillation (HCC)    Personal history of colonic polyps 02/25/2014   Personal history of DVT (deep vein thrombosis) X 2   "left"   Sleep apnea    Spinal stenosis    Past Surgical History:  Procedure Laterality Date   APPENDECTOMY     ATRIAL FIBRILLATION ABLATION N/A 04/25/2019   Procedure: ATRIAL FIBRILLATION ABLATION;  Surgeon: [name], MD;  Location: [place] [facility];  Service: Cardiovascular;  Laterality: N/A;   AV NODE ABLATION N/A 03/23/2022   Procedure: AV NODE ABLATION;  Surgeon: Vickie Epley, MD;  Location: [place] [facility];  Service: Cardiovascular;  Laterality: N/A;   CARDIOVERSION N/A 08/09/2015   Procedure: CARDIOVERSION;  Surgeon: Jerline Pain, MD;  Location: [place];  Service: Cardiovascular;  Laterality: N/A;   CARDIOVERSION N/A 09/30/2018   Procedure: CARDIOVERSION;  Surgeon: [name], MD;  Location: [facility] ENDOSCOPY;  Service: Cardiovascular;  Laterality: N/A;   CARDIOVERSION N/A 02/27/2019   Procedure: CARDIOVERSION;  Surgeon: Buford Dresser, MD;  Location: [facility] ENDOSCOPY;  Service: Cardiovascular;  Laterality: N/A;   CARDIOVERSION N/A 06/07/2019   Procedure: CARDIOVERSION;  Surgeon: [name], MD;  Location: [facility] ENDOSCOPY;  Service: Cardiovascular;  Laterality: N/A;   CATARACT EXTRACTION W/ INTRAOCULAR LENS  IMPLANT, BILATERAL Bilateral 2016   CYST REMOVAL HAND Bilateral 1990's   "played to much golf"   DILATION AND CURETTAGE OF UTERUS     implantable loop recorder placement  11/20/2019   Medtronic Reveal Linq model UKG 25 (model number [id number]) implanted in office by Dr Rayann Heman for afib management   LUMBAR LAMINECTOMY  40 yrs ago   "L3-4"   MENISCUS REPAIR Bilateral 2000 and 2010   PACEMAKER IMPLANT N/A 02/06/2022   Procedure: PACEMAKER IMPLANT;  Surgeon: Vickie Epley, MD;  Location: [place] [facility];  Service: Cardiovascular;  Laterality: N/A;   PACEMAKER INSERTION     REPLACEMENT TOTAL KNEE Left 2013   TEE WITHOUT CARDIOVERSION N/A 04/24/2019   Procedure: TRANSESOPHAGEAL ECHOCARDIOGRAM (TEE);  Surgeon: Acie Fredrickson Wonda Cheng, MD;  Location: [facility] ENDOSCOPY;  Service: Cardiovascular;  Laterality: N/A;   TONSILECTOMY, ADENOIDECTOMY, BILATERAL MYRINGOTOMY AND TUBES  child   TOTAL ABDOMINAL  HYSTERECTOMY  1995   had 2 tumors- benign   TUBAL LIGATION  88 years old    Current Outpatient Medications  Medication Sig Dispense Refill   acetaminophen (TYLENOL) 325 MG tablet Take 325 mg by mouth every morning.     atorvastatin (LIPITOR) 20 MG tablet TAKE 1 TABLET BY MOUTH  DAILY 90 tablet 3   bumetanide (BUMEX) 1 MG tablet TAKE 1 TABLET BY MOUTH DAILY 90 tablet 1   Calcium Carb-Cholecalciferol (CALCIUM 600 + D PO) Take 1  tablet by mouth 2 (two) times daily.     Carboxymethylcellul-Glycerin (LUBRICATING EYE DROPS OP) Place 1 drop into both eyes in the morning and at bedtime.     clindamycin (CLEOCIN) 300 MG capsule Take 1,200 mg by mouth See admin instructions. Before dental work     ELIQUIS 5 MG TABS tablet TAKE ONE TABLET EVERY MORNING AND AT BEDTIME 180 tablet 1   lisinopril (ZESTRIL) 10 MG tablet Take 1 tablet (10 mg total) by mouth daily. 90 tablet 1   loratadine (CLARITIN) 10 MG tablet Take 10 mg by mouth daily.      metoprolol succinate (TOPROL-XL) 25 MG 24 hr tablet Take 1 tablet (25 mg total) by mouth daily. 30 tablet 6   Multiple Vitamins-Minerals (ONE-A-DAY WOMENS 50 PLUS PO) Take 1 tablet by mouth daily.     potassium chloride (KLOR-CON) 10 MEQ tablet TAKE 1 TABLET BY MOUTH IN  THE MORNING AND 2 TABLETS  BY MOUTH IN THE EVENING 270 tablet 3   Probiotic CAPS Take 1 capsule by mouth daily. Gummy     psyllium (REGULOID) 0.52 g capsule Take 0.52 g by mouth 2 (two) times daily.      No current facility-administered medications for this visit.    Allergies:   Gabapentin, Lactose intolerance (gi), Penicillins, Sulfa antibiotics, Vancomycin, and Zebeta [bisoprolol fumarate]   Social History: Social History   Socioeconomic History   Marital status: Widowed    Spouse name: Not on file   Number of children: 3   Years of education: Not on file   Highest education level: Not on file  Occupational History   Occupation: retired Pharmacist, hospital  Tobacco Use   Smoking status: Never   Smokeless tobacco: Never   Tobacco comments:    never used tobacco  Substance and Sexual Activity   Alcohol use: No   Drug use: No   Sexual activity: Never    Comment: [place], low sodium diet  Other Topics Concern   Not on file  Social History Narrative   Not on file   Social Determinants of Health   Financial Resource Strain: Low Risk  (06/19/2021)   Overall Financial Resource Strain (CARDIA)     Difficulty of Paying Living Expenses: Not hard at all  Food Insecurity: No Food Insecurity (06/19/2021)   Hunger Vital Sign    Worried About Running Out of Food in the Last Year: Never true    Council in the Last Year: Never true  Transportation Needs: No Transportation Needs (06/19/2021)   PRAPARE - Hydrologist (Medical): No    Lack of Transportation (Non-Medical): No  Physical Activity: Sufficiently Active (06/19/2021)   Exercise Vital Sign    Days of Exercise per Week: 3 days    Minutes of Exercise per Session: 60 min  Stress: No Stress Concern Present (06/19/2021)   [place]    Feeling  of Stress : Not at all  Social Connections: Moderately Integrated (06/19/2021)   Social Connection and Isolation Panel [NHANES]    Frequency of Communication with Friends and Family: More than three times a week    Frequency of Social Gatherings with Friends and Family: More than three times a week    Attends Religious Services: More than 4 times per year    Active Member of [employer] or Organizations: Yes    Attends Archivist Meetings: 1 to 4 times per year    Marital Status: Widowed  Intimate Partner Violence: Not At Risk (06/19/2021)   Humiliation, Afraid, Rape, and Kick questionnaire    Fear of Current or Ex-Partner: No    Emotionally Abused: No    Physically Abused: No    Sexually Abused: No    Family History: Family History  Problem Relation Age of Onset   Heart attack Mother 48   Hyperlipidemia Mother        ?   Dementia Mother    Pernicious anemia Mother    COPD Father        smoker   Cancer Father 52       prostate   Heart disease Brother        quadruple bipass surgery   Hyperlipidemia Brother    Hypertension Brother    Diabetes Maternal Grandmother        type 2   Pernicious anemia Maternal Grandmother    Gout Son    Atrial fibrillation Son    Hyperlipidemia  Son    Cancer Maternal Grandfather        liver   Cancer Paternal Grandmother        lung- doesn't think she smokes?   Stroke Paternal Grandfather    Cancer Son 68       non hodgin's lymphoma   Gout Son    Hyperlipidemia Son    Sleep apnea Son    Colon cancer Neg Hx    Pancreatic cancer Neg Hx    Stomach cancer Neg Hx    Throat cancer Neg Hx    Liver disease Neg Hx      Review of Systems: All other systems reviewed and are otherwise negative except as noted above.  Physical Exam: Vitals:   04/13/22 1105  BP: (!) 132/90  Pulse: 91  SpO2: 97%  Weight: 270 lb (122.5 kg)  Height: '5\' 3"'$  (1.6 m)     GEN- The patient is well appearing, alert and oriented x 3 today.   HEENT: normocephalic, atraumatic; sclera clear, conjunctiva pink; hearing intact; oropharynx clear; neck supple  Lungs- Clear to ausculation bilaterally, normal work of breathing.  No wheezes, rales, rhonchi Heart- Regular rate and rhythm, no murmurs, rubs or gallops  GI- soft, non-tender, non-distended, bowel sounds present  Extremities- no clubbing or cyanosis. No edema MS- no significant deformity or atrophy Skin- warm and dry, no rash or lesion; PPM pocket well healed Psych- euthymic mood, full affect Neuro- strength and sensation are intact  PPM Interrogation- reviewed in detail today,  See PACEART report  EKG:  EKG is not ordered today.  Recent Labs: 02/08/2022: ALT 13; B Natriuretic Peptide 313.9; Magnesium 1.9; TSH 3.018 03/06/2022: BUN 23; Creatinine, Ser 1.05; Hemoglobin 12.9; Platelets 224.0; Potassium 4.3; Sodium 140   Wt Readings from Last 3 Encounters:  04/13/22 270 lb (122.5 kg)  03/30/22 272 lb (123.4 kg)  03/23/22 281 lb 11.2 oz (127.8 kg)     Other studies Reviewed:  Additional studies/ records that were reviewed today include: Previous EP office notes, Previous remote checks, Most recent labwork.   Assessment and Plan:  1. Uncontrolled atrial arrhyhtmia s/p AV node ablation s/p St.  Jude PPM  Normal PPM function See Pace Art report Continue Eliquis for CHA2DS2/VASc of at least 5 Lowered LRL to 70 and and turned Rate response on at nominal settings.   Current medicines are reviewed at length with the patient today.     Disposition:   Follow up with Dr. Quentin Ore in as scheduled    Signed, Shirley Friar, PA-C  04/13/2022 11:33 AM  [place] [address] [place] [address] [phone] (office) [phone] (fax)

## 2022-04-13 ENCOUNTER — Encounter: Payer: Self-pay | Admitting: Student

## 2022-04-13 ENCOUNTER — Ambulatory Visit (INDEPENDENT_AMBULATORY_CARE_PROVIDER_SITE_OTHER): Payer: Medicare Other | Admitting: Student

## 2022-04-13 VITALS — BP 132/90 | HR 91 | Ht 63.0 in | Wt 270.0 lb

## 2022-04-13 DIAGNOSIS — I4819 Other persistent atrial fibrillation: Secondary | ICD-10-CM | POA: Diagnosis not present

## 2022-04-13 DIAGNOSIS — I10 Essential (primary) hypertension: Secondary | ICD-10-CM | POA: Diagnosis not present

## 2022-04-13 DIAGNOSIS — Z950 Presence of cardiac pacemaker: Secondary | ICD-10-CM

## 2022-04-13 NOTE — Patient Instructions (Signed)

## 2022-04-14 LAB — CUP PACEART INCLINIC DEVICE CHECK
Battery Remaining Longevity: 121 mo
Battery Voltage: 3.04 V
Brady Statistic RV Percent Paced: 99.98 %
Date Time Interrogation Session: 20230814113600
Implantable Lead Implant Date: 20230609
Implantable Lead Location: 753860
Implantable Pulse Generator Implant Date: 20230609
Lead Channel Impedance Value: 525 Ohm
Lead Channel Pacing Threshold Amplitude: 0.5 V
Lead Channel Pacing Threshold Pulse Width: 0.5 ms
Lead Channel Sensing Intrinsic Amplitude: 12 mV
Lead Channel Setting Pacing Amplitude: 0.75 V
Lead Channel Setting Pacing Pulse Width: 0.5 ms
Lead Channel Setting Sensing Sensitivity: 4 mV
Pulse Gen Model: 1272
Pulse Gen Serial Number: 8068284

## 2022-05-04 NOTE — Assessment & Plan Note (Signed)
Tolerating Eliquis, rate controlled 

## 2022-05-04 NOTE — Assessment & Plan Note (Signed)
Following with pulmonology 

## 2022-05-04 NOTE — Assessment & Plan Note (Signed)
Tolerating statin, encouraged heart healthy diet, avoid trans fats, minimize simple carbs and saturated fats. Increase exercise as tolerated 

## 2022-05-04 NOTE — Assessment & Plan Note (Signed)
hgba1c acceptable, minimize simple carbs. Increase exercise as tolerated.  

## 2022-05-04 NOTE — Assessment & Plan Note (Signed)
No recent exacerbation 

## 2022-05-04 NOTE — Assessment & Plan Note (Signed)
Well controlled, no changes to meds. Encouraged heart healthy diet such as the DASH diet and exercise as tolerated.  °

## 2022-05-05 ENCOUNTER — Ambulatory Visit (INDEPENDENT_AMBULATORY_CARE_PROVIDER_SITE_OTHER): Payer: Medicare Other | Admitting: Family Medicine

## 2022-05-05 DIAGNOSIS — I10 Essential (primary) hypertension: Secondary | ICD-10-CM | POA: Diagnosis not present

## 2022-05-05 DIAGNOSIS — I5032 Chronic diastolic (congestive) heart failure: Secondary | ICD-10-CM | POA: Diagnosis not present

## 2022-05-05 DIAGNOSIS — I480 Paroxysmal atrial fibrillation: Secondary | ICD-10-CM | POA: Diagnosis not present

## 2022-05-05 DIAGNOSIS — G4730 Sleep apnea, unspecified: Secondary | ICD-10-CM | POA: Diagnosis not present

## 2022-05-05 DIAGNOSIS — J8410 Pulmonary fibrosis, unspecified: Secondary | ICD-10-CM

## 2022-05-05 DIAGNOSIS — E782 Mixed hyperlipidemia: Secondary | ICD-10-CM

## 2022-05-05 DIAGNOSIS — R739 Hyperglycemia, unspecified: Secondary | ICD-10-CM

## 2022-05-05 NOTE — Patient Instructions (Addendum)
Tetanus at pharmacy in December or if you have an injury and are offered it sooner take it.  RSV (respiratory syncitial virus) vaccine at pharmacy Covid booster when new version out late November At pharmacy High dose flu shot mid Sept to mid Oct

## 2022-05-05 NOTE — Progress Notes (Signed)
Subjective:   By signing my name below, I, [name], attest that this documentation has been prepared under the direction and in the presence of [name], MD 05/05/2022.    Patient ID: Tracy Miles, female    DOB: 30-Jan-1934, 88 y.o.   MRN: [id number]  No chief complaint on file.  HPI Patient is in today for an office visit.  Cardiology: She reports that she has had AFIB for 37 years. She underwent an AV node ablation on 03/23/2022 performed by Dr Quentin Ore. Her blood pressure and pulse are within normal range today.  BP Readings from Last 3 Encounters:  05/05/22 130/82  04/13/22 (!) 132/90  03/30/22 (!) 148/75   Pulse Readings from Last 3 Encounters:  05/05/22 72  04/13/22 91  03/30/22 91   Exercise: She is swimming 3 times a week.  Immunizations: She has been informed about receiving COVID-19, high-dose Flu, and RSV immunizations. She is up to date on Tetanus immunizations.   Past Medical History:  Diagnosis Date   Anemia    Aortic stenosis    Very mild   Benign paroxysmal positional vertigo 12/17/2013   Chicken pox as a child   Colon polyp    Coronary artery disease    DDD (degenerative disc disease) 11/16/2013   Hyperlipidemia    Hypertension    Iron deficiency anemia 11/13/2013   Mumps child and teenager   OA (osteoarthritis) 11/19/2013   S/p L TKR   Obesity    OSA on CPAP    Pancreatitis    post hysterectomy   Persistent atrial fibrillation (HCC)    Personal history of colonic polyps 02/25/2014   Personal history of DVT (deep vein thrombosis) X 2   "left"   Sleep apnea    Spinal stenosis    Past Surgical History:  Procedure Laterality Date   APPENDECTOMY     ATRIAL FIBRILLATION ABLATION N/A 04/25/2019   Procedure: ATRIAL FIBRILLATION ABLATION;  Surgeon: [name], MD;  Location: [place] [facility];  Service: Cardiovascular;  Laterality: N/A;   AV NODE ABLATION N/A 03/23/2022   Procedure: AV NODE ABLATION;  Surgeon: Vickie Epley,  MD;  Location: [place] [facility];  Service: Cardiovascular;  Laterality: N/A;   CARDIOVERSION N/A 08/09/2015   Procedure: CARDIOVERSION;  Surgeon: Jerline Pain, MD;  Location: [place];  Service: Cardiovascular;  Laterality: N/A;   CARDIOVERSION N/A 09/30/2018   Procedure: CARDIOVERSION;  Surgeon: [name], MD;  Location: [facility] ENDOSCOPY;  Service: Cardiovascular;  Laterality: N/A;   CARDIOVERSION N/A 02/27/2019   Procedure: CARDIOVERSION;  Surgeon: Buford Dresser, MD;  Location: [facility] ENDOSCOPY;  Service: Cardiovascular;  Laterality: N/A;   CARDIOVERSION N/A 06/07/2019   Procedure: CARDIOVERSION;  Surgeon: [name], MD;  Location: [facility] ENDOSCOPY;  Service: Cardiovascular;  Laterality: N/A;   CATARACT EXTRACTION W/ INTRAOCULAR LENS  IMPLANT, BILATERAL Bilateral 2016   CYST REMOVAL HAND Bilateral 1990's   "played to much golf"   DILATION AND CURETTAGE OF UTERUS     implantable loop recorder placement  11/20/2019   Medtronic Reveal Linq model ZTI 45 (model number [id number]) implanted in office by Dr Rayann Heman for afib management   LUMBAR LAMINECTOMY  40 yrs ago   "L3-4"   MENISCUS REPAIR Bilateral 2000 and 2010   PACEMAKER IMPLANT N/A 02/06/2022   Procedure: PACEMAKER IMPLANT;  Surgeon: Vickie Epley, MD;  Location: [place] [facility];  Service: Cardiovascular;  Laterality: N/A;   PACEMAKER INSERTION  REPLACEMENT TOTAL KNEE Left 2013   TEE WITHOUT CARDIOVERSION N/A 04/24/2019   Procedure: TRANSESOPHAGEAL ECHOCARDIOGRAM (TEE);  Surgeon: Acie Fredrickson Wonda Cheng, MD;  Location: [facility] ENDOSCOPY;  Service: Cardiovascular;  Laterality: N/A;   TONSILECTOMY, ADENOIDECTOMY, BILATERAL MYRINGOTOMY AND TUBES  child   TOTAL ABDOMINAL HYSTERECTOMY  1995   had 2 tumors- benign   TUBAL LIGATION  88 years old   Family History  Problem Relation Age of Onset   Heart attack Mother 23   Hyperlipidemia Mother        ?   Dementia Mother    Pernicious anemia Mother    COPD Father         smoker   Cancer Father 7       prostate   Heart disease Brother        quadruple bipass surgery   Hyperlipidemia Brother    Hypertension Brother    Diabetes Maternal Grandmother        type 2   Pernicious anemia Maternal Grandmother    Gout Son    Atrial fibrillation Son    Hyperlipidemia Son    Cancer Maternal Grandfather        liver   Cancer Paternal Grandmother        lung- doesn't think she smokes?   Stroke Paternal Grandfather    Cancer Son 66       non hodgin's lymphoma   Gout Son    Hyperlipidemia Son    Sleep apnea Son    Colon cancer Neg Hx    Pancreatic cancer Neg Hx    Stomach cancer Neg Hx    Throat cancer Neg Hx    Liver disease Neg Hx    Social History   Socioeconomic History   Marital status: Widowed    Spouse name: Not on file   Number of children: 3   Years of education: Not on file   Highest education level: Not on file  Occupational History   Occupation: retired Pharmacist, hospital  Tobacco Use   Smoking status: Never   Smokeless tobacco: Never   Tobacco comments:    never used tobacco  Substance and Sexual Activity   Alcohol use: No   Drug use: No   Sexual activity: Never    Comment: [place], low sodium diet  Other Topics Concern   Not on file  Social History Narrative   Not on file   Social Determinants of Health   Financial Resource Strain: Low Risk  (06/19/2021)   Overall Financial Resource Strain (CARDIA)    Difficulty of Paying Living Expenses: Not hard at all  Food Insecurity: No Food Insecurity (06/19/2021)   Hunger Vital Sign    Worried About Running Out of Food in the Last Year: Never true    [place] in the Last Year: Never true  Transportation Needs: No Transportation Needs (06/19/2021)   PRAPARE - Hydrologist (Medical): No    Lack of Transportation (Non-Medical): No  Physical Activity: Sufficiently Active (06/19/2021)   Exercise Vital Sign    Days of Exercise per Week: 3 days     Minutes of Exercise per Session: 60 min  Stress: No Stress Concern Present (06/19/2021)   [place]    Feeling of Stress : Not at all  Social Connections: Moderately Integrated (06/19/2021)   Social Connection and Isolation Panel [NHANES]    Frequency of Communication with Friends and  Family: More than three times a week    Frequency of Social Gatherings with Friends and Family: More than three times a week    Attends Religious Services: More than 4 times per year    Active Member of Clubs or Organizations: Yes    Attends Archivist Meetings: 1 to 4 times per year    Marital Status: Widowed  Intimate Partner Violence: Not At Risk (06/19/2021)   Humiliation, Afraid, Rape, and Kick questionnaire    Fear of Current or Ex-Partner: No    Emotionally Abused: No    Physically Abused: No    Sexually Abused: No   Outpatient Medications Prior to Visit  Medication Sig Dispense Refill   acetaminophen (TYLENOL) 325 MG tablet Take 325 mg by mouth every morning.     atorvastatin (LIPITOR) 20 MG tablet TAKE 1 TABLET BY MOUTH  DAILY 90 tablet 3   bumetanide (BUMEX) 1 MG tablet TAKE 1 TABLET BY MOUTH DAILY 90 tablet 1   Calcium Carb-Cholecalciferol (CALCIUM 600 + D PO) Take 1 tablet by mouth 2 (two) times daily.     Carboxymethylcellul-Glycerin (LUBRICATING EYE DROPS OP) Place 1 drop into both eyes in the morning and at bedtime.     clindamycin (CLEOCIN) 300 MG capsule Take 1,200 mg by mouth See admin instructions. Before dental work     ELIQUIS 5 MG TABS tablet TAKE ONE TABLET EVERY MORNING AND AT BEDTIME 180 tablet 1   lisinopril (ZESTRIL) 10 MG tablet Take 1 tablet (10 mg total) by mouth daily. 90 tablet 1   loratadine (CLARITIN) 10 MG tablet Take 10 mg by mouth daily.      metoprolol succinate (TOPROL-XL) 25 MG 24 hr tablet Take 1 tablet (25 mg total) by mouth daily. 30 tablet 6   Multiple Vitamins-Minerals (ONE-A-DAY  WOMENS 50 PLUS PO) Take 1 tablet by mouth daily.     potassium chloride (KLOR-CON) 10 MEQ tablet TAKE 1 TABLET BY MOUTH IN  THE MORNING AND 2 TABLETS  BY MOUTH IN THE EVENING 270 tablet 3   Probiotic CAPS Take 1 capsule by mouth daily. Gummy     psyllium (REGULOID) 0.52 g capsule Take 0.52 g by mouth 2 (two) times daily.      No facility-administered medications prior to visit.   Allergies  Allergen Reactions   Gabapentin Swelling    Eyes and Face   Lactose Intolerance (Gi) Other (See Comments)    Bothers her stomach   Penicillins Hives and Rash    Did it involve swelling of the face/tongue/throat, SOB, or low BP? No Did it involve sudden or severe rash/hives, skin peeling, or any reaction on the inside of your mouth or nose? Yes Did you need to seek medical attention at a hospital or doctor's office? No When did it last happen?       25+ years If all above answers are "NO", may proceed with cephalosporin use.  Hives in throat     Sulfa Antibiotics Nausea Only and Rash   Vancomycin Itching   Zebeta [Bisoprolol Fumarate] Nausea Only   ROS     Objective:    Physical Exam Constitutional:      General: She is not in acute distress.    Appearance: Normal appearance. She is not ill-appearing.  HENT:     Head: Normocephalic and atraumatic.     Right Ear: External ear normal.     Left Ear: External ear normal.     Mouth/Throat:  Mouth: Mucous membranes are moist.     Pharynx: Oropharynx is clear.  Eyes:     Extraocular Movements: Extraocular movements intact.     Pupils: Pupils are equal, round, and reactive to light.  Cardiovascular:     Rate and Rhythm: Normal rate and regular rhythm.     Pulses: Normal pulses.     Heart sounds: Normal heart sounds. No murmur heard.    No gallop.  Pulmonary:     Effort: Pulmonary effort is normal. No respiratory distress.     Breath sounds: Normal breath sounds. No wheezing or rales.  Abdominal:     General: Bowel sounds are normal.   Skin:    General: Skin is warm and dry.  Neurological:     Mental Status: She is alert and oriented to person, place, and time.  Psychiatric:        Mood and Affect: Mood normal.        Behavior: Behavior normal.        Judgment: Judgment normal.    There were no vitals taken for this visit. Wt Readings from Last 3 Encounters:  04/13/22 270 lb (122.5 kg)  03/30/22 272 lb (123.4 kg)  03/23/22 281 lb 11.2 oz (127.8 kg)   Diabetic Foot Exam - Simple   No data filed    Lab Results  Component Value Date   WBC 7.2 03/06/2022   HGB 12.9 03/06/2022   HCT 39.0 03/06/2022   PLT 224.0 03/06/2022   GLUCOSE 110 (H) 03/06/2022   CHOL 145 01/20/2022   TRIG 152.0 (H) 01/20/2022   HDL 41.80 01/20/2022   LDLCALC 72 01/20/2022   ALT 13 02/08/2022   AST 18 02/08/2022   NA 140 03/06/2022   K 4.3 03/06/2022   CL 103 03/06/2022   CREATININE 1.05 03/06/2022   BUN 23 03/06/2022   CO2 27 03/06/2022   TSH 3.018 02/08/2022   INR 1.55 (H) 08/06/2015   HGBA1C 6.0 01/20/2022   Lab Results  Component Value Date   TSH 3.018 02/08/2022   Lab Results  Component Value Date   WBC 7.2 03/06/2022   HGB 12.9 03/06/2022   HCT 39.0 03/06/2022   MCV 94.1 03/06/2022   PLT 224.0 03/06/2022   Lab Results  Component Value Date   NA 140 03/06/2022   K 4.3 03/06/2022   CO2 27 03/06/2022   GLUCOSE 110 (H) 03/06/2022   BUN 23 03/06/2022   CREATININE 1.05 03/06/2022   BILITOT 1.3 (H) 02/08/2022   ALKPHOS 81 02/08/2022   AST 18 02/08/2022   ALT 13 02/08/2022   PROT 6.2 (L) 02/08/2022   ALBUMIN 3.4 (L) 02/08/2022   CALCIUM 9.8 03/06/2022   ANIONGAP 6 02/08/2022   GFR 48.32 (L) 03/06/2022   Lab Results  Component Value Date   CHOL 145 01/20/2022   Lab Results  Component Value Date   HDL 41.80 01/20/2022   Lab Results  Component Value Date   LDLCALC 72 01/20/2022   Lab Results  Component Value Date   TRIG 152.0 (H) 01/20/2022   Lab Results  Component Value Date   CHOLHDL 3  01/20/2022   Lab Results  Component Value Date   HGBA1C 6.0 01/20/2022      Assessment & Plan:   Problem List Items Addressed This Visit       Cardiovascular and Mediastinum   Atrial fibrillation ([place])   Essential hypertension   Chronic diastolic HF (heart failure) ([place])     Respiratory  Pulmonary fibrosis, unspecified ([place])     Other   Hyperlipidemia, mixed   Hyperglycemia   No orders of the defined types were placed in this encounter.  I, [name], personally preformed the services described in this documentation.  All medical record entries made by the scribe were at my direction and in my presence.  I have reviewed the chart and discharge instructions (if applicable) and agree that the record reflects my personal performance and is accurate and complete. 05/05/2022  I,Mohammed Iqbal,acting as a scribe for [name], MD.,have documented all relevant documentation on the behalf of [name], MD,as directed by  [name], MD while in the presence of [name], MD.  [name]

## 2022-05-05 NOTE — Assessment & Plan Note (Signed)
Using Bipap nightly

## 2022-05-06 LAB — CBC
HCT: 41.2 % (ref 36.0–46.0)
Hemoglobin: 13.6 g/dL (ref 12.0–15.0)
MCHC: 32.9 g/dL (ref 30.0–36.0)
MCV: 94.8 fl (ref 78.0–100.0)
Platelets: 213 10*3/uL (ref 150.0–400.0)
RBC: 4.35 Mil/uL (ref 3.87–5.11)
RDW: 14.7 % (ref 11.5–15.5)
WBC: 5 10*3/uL (ref 4.0–10.5)

## 2022-05-06 LAB — COMPREHENSIVE METABOLIC PANEL
ALT: 12 U/L (ref 0–35)
AST: 19 U/L (ref 0–37)
Albumin: 4 g/dL (ref 3.5–5.2)
Alkaline Phosphatase: 82 U/L (ref 39–117)
BUN: 14 mg/dL (ref 6–23)
CO2: 32 mEq/L (ref 19–32)
Calcium: 9.9 mg/dL (ref 8.4–10.5)
Chloride: 100 mEq/L (ref 96–112)
Creatinine, Ser: 1.01 mg/dL (ref 0.40–1.20)
GFR: 50.56 mL/min — ABNORMAL LOW (ref >60.00)
Glucose, Bld: 93 mg/dL (ref 70–99)
Potassium: 4 mEq/L (ref 3.5–5.1)
Sodium: 141 mEq/L (ref 135–145)
Total Bilirubin: 1.1 mg/dL (ref 0.2–1.2)
Total Protein: 6.6 g/dL (ref 6.0–8.3)

## 2022-05-06 LAB — LIPID PANEL
Cholesterol: 141 mg/dL (ref 0–200)
HDL: 43.8 mg/dL (ref >39.00)
LDL Cholesterol: 73 mg/dL (ref 0–99)
NonHDL: 96.86
Total CHOL/HDL Ratio: 3
Triglycerides: 121 mg/dL (ref 0.0–149.0)
VLDL: 24.2 mg/dL (ref 0.0–40.0)

## 2022-05-06 LAB — TSH: TSH: 1.99 u[IU]/mL (ref 0.35–5.50)

## 2022-05-06 LAB — HEMOGLOBIN A1C: Hgb A1c MFr Bld: 6.2 % (ref 4.6–6.5)

## 2022-05-11 ENCOUNTER — Ambulatory Visit: Payer: Medicare Other | Attending: Cardiology | Admitting: Cardiology

## 2022-05-11 ENCOUNTER — Encounter: Payer: Self-pay | Admitting: Cardiology

## 2022-05-11 VITALS — BP 124/80 | HR 72 | Ht 63.0 in | Wt 271.2 lb

## 2022-05-11 DIAGNOSIS — G4733 Obstructive sleep apnea (adult) (pediatric): Secondary | ICD-10-CM | POA: Insufficient documentation

## 2022-05-11 DIAGNOSIS — I5032 Chronic diastolic (congestive) heart failure: Secondary | ICD-10-CM | POA: Insufficient documentation

## 2022-05-11 DIAGNOSIS — Z950 Presence of cardiac pacemaker: Secondary | ICD-10-CM | POA: Insufficient documentation

## 2022-05-11 DIAGNOSIS — I4821 Permanent atrial fibrillation: Secondary | ICD-10-CM | POA: Diagnosis not present

## 2022-05-11 NOTE — Progress Notes (Deleted)
Electrophysiology Office Follow up Visit Note:    Date:  05/11/2022   ID:  Tracy Miles, DOB [birth year], MRN [id number]  PCP:  [name], MD  [facility] Cardiologist:  None  [facility] Electrophysiologist:  Vickie Epley, MD    Interval History:    Tracy Miles is a 88 y.o. female who presents for a follow up visit.  She had a permanent pacemaker implanted on February 06, 2022.  This was followed by an AV nodal ablation on March 23, 2022.  She saw Jonni Sanger in follow-up April 13, 2022.  She reported doing "great".  She was back exercising in the pool and feeling much better after the above treatments.       Past Medical History:  Diagnosis Date   Anemia    Aortic stenosis    Very mild   Benign paroxysmal positional vertigo 12/17/2013   Chicken pox as a child   Colon polyp    Coronary artery disease    DDD (degenerative disc disease) 11/16/2013   Hyperlipidemia    Hypertension    Iron deficiency anemia 11/13/2013   Mumps child and teenager   OA (osteoarthritis) 11/19/2013   S/p L TKR   Obesity    OSA on CPAP    Pancreatitis    post hysterectomy   Persistent atrial fibrillation (HCC)    Personal history of colonic polyps 02/25/2014   Personal history of DVT (deep vein thrombosis) X 2   "left"   Sleep apnea    Spinal stenosis     Past Surgical History:  Procedure Laterality Date   APPENDECTOMY     ATRIAL FIBRILLATION ABLATION N/A 04/25/2019   Procedure: ATRIAL FIBRILLATION ABLATION;  Surgeon: [name], MD;  Location: [place] [facility];  Service: Cardiovascular;  Laterality: N/A;   AV NODE ABLATION N/A 03/23/2022   Procedure: AV NODE ABLATION;  Surgeon: Vickie Epley, MD;  Location: [place] [facility];  Service: Cardiovascular;  Laterality: N/A;   CARDIOVERSION N/A 08/09/2015   Procedure: CARDIOVERSION;  Surgeon: Jerline Pain, MD;  Location: [place];  Service: Cardiovascular;  Laterality: N/A;   CARDIOVERSION N/A 09/30/2018   Procedure:  CARDIOVERSION;  Surgeon: [name], MD;  Location: [facility] ENDOSCOPY;  Service: Cardiovascular;  Laterality: N/A;   CARDIOVERSION N/A 02/27/2019   Procedure: CARDIOVERSION;  Surgeon: Buford Dresser, MD;  Location: [facility] ENDOSCOPY;  Service: Cardiovascular;  Laterality: N/A;   CARDIOVERSION N/A 06/07/2019   Procedure: CARDIOVERSION;  Surgeon: [name], MD;  Location: [facility] ENDOSCOPY;  Service: Cardiovascular;  Laterality: N/A;   CATARACT EXTRACTION W/ INTRAOCULAR LENS  IMPLANT, BILATERAL Bilateral 2016   CYST REMOVAL HAND Bilateral 1990's   "played to much golf"   DILATION AND CURETTAGE OF UTERUS     implantable loop recorder placement  11/20/2019   Medtronic Reveal Linq model HQP 59 (model number [id number]) implanted in office by Dr Rayann Heman for afib management   LUMBAR LAMINECTOMY  40 yrs ago   "L3-4"   MENISCUS REPAIR Bilateral 2000 and 2010   PACEMAKER IMPLANT N/A 02/06/2022   Procedure: PACEMAKER IMPLANT;  Surgeon: Vickie Epley, MD;  Location: [place] [facility];  Service: Cardiovascular;  Laterality: N/A;   PACEMAKER INSERTION     REPLACEMENT TOTAL KNEE Left 2013   TEE WITHOUT CARDIOVERSION N/A 04/24/2019   Procedure: TRANSESOPHAGEAL ECHOCARDIOGRAM (TEE);  Surgeon: Acie Fredrickson Wonda Cheng, MD;  Location: [place];  Service: Cardiovascular;  Laterality: N/A;   TONSILECTOMY, ADENOIDECTOMY, BILATERAL MYRINGOTOMY AND TUBES  child   TOTAL ABDOMINAL HYSTERECTOMY  1995   had 2 tumors- benign   TUBAL LIGATION  88 years old    Current Medications: No outpatient medications have been marked as taking for the 05/11/22 encounter (Appointment) with Vickie Epley, MD.     Allergies:   Gabapentin, Lactose intolerance (gi), Penicillins, Sulfa antibiotics, Vancomycin, and Zebeta [bisoprolol fumarate]   Social History   Socioeconomic History   Marital status: Widowed    Spouse name: Not on file   Number of children: 3   Years of education: Not on file   Highest education  level: Not on file  Occupational History   Occupation: retired Pharmacist, hospital  Tobacco Use   Smoking status: Never   Smokeless tobacco: Never   Tobacco comments:    never used tobacco  Substance and Sexual Activity   Alcohol use: No   Drug use: No   Sexual activity: Never    Comment: [place], low sodium diet  Other Topics Concern   Not on file  Social History Narrative   Not on file   Social Determinants of Health   Financial Resource Strain: Low Risk  (06/19/2021)   Overall Financial Resource Strain (CARDIA)    Difficulty of Paying Living Expenses: Not hard at all  Food Insecurity: No Food Insecurity (06/19/2021)   Hunger Vital Sign    Worried About Running Out of Food in the Last Year: Never true    [place] in the Last Year: Never true  Transportation Needs: No Transportation Needs (06/19/2021)   PRAPARE - Hydrologist (Medical): No    Lack of Transportation (Non-Medical): No  Physical Activity: Sufficiently Active (06/19/2021)   Exercise Vital Sign    Days of Exercise per Week: 3 days    Minutes of Exercise per Session: 60 min  Stress: No Stress Concern Present (06/19/2021)   [place]    Feeling of Stress : Not at all  Social Connections: Moderately Integrated (06/19/2021)   Social Connection and Isolation Panel [NHANES]    Frequency of Communication with Friends and Family: More than three times a week    Frequency of Social Gatherings with Friends and Family: More than three times a week    Attends Religious Services: More than 4 times per year    Active Member of [employer] or Organizations: Yes    Attends Archivist Meetings: 1 to 4 times per year    Marital Status: Widowed     Family History: The patient's family history includes Atrial fibrillation in her son; COPD in her father; Cancer in her maternal grandfather and paternal grandmother;  Cancer (age of onset: 80) in her son; Cancer (age of onset: 65) in her father; Dementia in her mother; Diabetes in her maternal grandmother; Gout in her son and son; Heart attack (age of onset: 81) in her mother; Heart disease in her brother; Hyperlipidemia in her brother, mother, son, and son; Hypertension in her brother; Pernicious anemia in her maternal grandmother and mother; Sleep apnea in her son; Stroke in her paternal grandfather. There is no history of Colon cancer, Pancreatic cancer, Stomach cancer, Throat cancer, or Liver disease.  ROS:   Please see the history of present illness.    All other systems reviewed and are negative.  EKGs/Labs/Other Studies Reviewed:    The following studies were reviewed today:  May 11, 2022 in clinic device interrogation  personally reviewed ***   Recent Labs: 02/08/2022: B Natriuretic Peptide 313.9; Magnesium 1.9 05/05/2022: ALT 12; BUN 14; Creatinine, Ser 1.01; Hemoglobin 13.6; Platelets 213.0; Potassium 4.0; Sodium 141; TSH 1.99  Recent Lipid Panel    Component Value Date/Time   CHOL 141 05/05/2022 1512   CHOL 138 04/20/2019 1345   TRIG 121.0 05/05/2022 1512   HDL 43.80 05/05/2022 1512   HDL 41 04/20/2019 1345   CHOLHDL 3 05/05/2022 1512   VLDL 24.2 05/05/2022 1512   LDLCALC 73 05/05/2022 1512   LDLCALC 85 05/14/2020 1413    Physical Exam:    VS:  There were no vitals taken for this visit.    Wt Readings from Last 3 Encounters:  05/05/22 270 lb 6.4 oz (122.7 kg)  04/13/22 270 lb (122.5 kg)  03/30/22 272 lb (123.4 kg)     GEN: *** Well nourished, well developed in no acute distress HEENT: Normal NECK: No JVD; No carotid bruits LYMPHATICS: No lymphadenopathy CARDIAC: RRR, no murmurs, rubs, gallops.  Prepectoral pocket healing well RESPIRATORY:  Clear to auscultation without rales, wheezing or rhonchi  ABDOMEN: Soft, non-tender, non-distended MUSCULOSKELETAL:  No edema; No deformity  SKIN: Warm and dry NEUROLOGIC:  Alert  and oriented x 3 PSYCHIATRIC:  Normal affect        ASSESSMENT:    1. Permanent atrial fibrillation ([place])   2. Chronic diastolic HF (heart failure) ([place])   3. Morbid obesity ([place])   4. Obstructive sleep apnea   5. Pacemaker    PLAN:    In order of problems listed above:  #Permanent atrial fibrillation #Permanent pacemaker in situ #Status post AV junction ablation Doing well after her pacemaker implant and AV nodal ablation on March 23, 2022.  Her site has healed well.  She is feeling much better with an improved exercise tolerance.  We will continue remote monitoring of her permanent pacemaker and plan to see her back in about 1 year or sooner as needed.  She will continue taking Eliquis for stroke prophylaxis.   Follow-up 1 year with APP.      Total time spent with patient today *** minutes. This includes reviewing records, evaluating the patient and coordinating care.   Medication Adjustments/Labs and Tests Ordered: Current medicines are reviewed at length with the patient today.  Concerns regarding medicines are outlined above.  No orders of the defined types were placed in this encounter.  No orders of the defined types were placed in this encounter.    Signed, Lars Mage, MD, [facility], [facility] 05/11/2022 5:34 AM    Electrophysiology [facility]

## 2022-05-11 NOTE — Patient Instructions (Signed)
Medication Instructions:  Stop metoprolol succinate  *If you need a refill on your cardiac medications before your next appointment, please call your pharmacy*   Lab Work: none If you have labs (blood work) drawn today and your tests are completely normal, you will receive your results only by: [place] (if you have MyChart) OR A paper copy in the mail If you have any lab test that is abnormal or we need to change your treatment, we will call you to review the results.   Testing/Procedures: none   Follow-Up: At [facility], you and your health needs are our priority.  As part of our continuing mission to provide you with exceptional heart care, we have created designated Provider Care Teams.  These Care Teams include your primary Cardiologist (physician) and Advanced Practice Providers (APPs -  Physician Assistants and Nurse Practitioners) who all work together to provide you with the care you need, when you need it.  We recommend signing up for the patient portal called "MyChart".  Sign up information is provided on this After Visit Summary.  MyChart is used to connect with patients for Virtual Visits (Telemedicine).  Patients are able to view lab/test results, encounter notes, upcoming appointments, etc.  Non-urgent messages can be sent to your provider as well.   To learn more about what you can do with MyChart, go to [email or web address].    Your next appointment:   1 year(s)  The format for your next appointment:   In Person  Provider:   You will see one of the following Advanced Practice Providers on your designated Care Team:   Tommye Standard, [place] Legrand Como "Jonni Sanger" Chalmers Cater, [place]      Other Instructions None   Important Information About [place]

## 2022-05-11 NOTE — Progress Notes (Signed)
Electrophysiology Office Follow up Visit Note:    Date:  05/11/2022   ID:  Tracy Miles, DOB [birth year], MRN [id number]  PCP:  [name], MD  [facility] Cardiologist:  None  [facility] Electrophysiologist:  Vickie Epley, MD    Interval History:    Tracy Miles is a 88 y.o. female who presents for a follow up visit. She had a permanent pacemaker implanted on February 06, 2022.  This was followed by an AV nodal ablation on March 23, 2022.  She saw Jonni Sanger in follow-up April 13, 2022.  She reported doing "great".  She was back exercising in the pool and feeling much better after the above treatments.  Today, she reports feeling very well since her ablation. She confirms good energy levels. For exercise she is going to the pool three times a week. She is working on walking more now that she is able to breathe well.  We reviewed her medications in detail. She is taking Bumex daily.  She denies any palpitations, chest pain, shortness of breath, or peripheral edema. No lightheadedness, headaches, syncope, orthopnea, or PND.      Past Medical History:  Diagnosis Date   Anemia    Aortic stenosis    Very mild   Benign paroxysmal positional vertigo 12/17/2013   Chicken pox as a child   Colon polyp    Coronary artery disease    DDD (degenerative disc disease) 11/16/2013   Hyperlipidemia    Hypertension    Iron deficiency anemia 11/13/2013   Mumps child and teenager   OA (osteoarthritis) 11/19/2013   S/p L TKR   Obesity    OSA on CPAP    Pancreatitis    post hysterectomy   Persistent atrial fibrillation (HCC)    Personal history of colonic polyps 02/25/2014   Personal history of DVT (deep vein thrombosis) X 2   "left"   Sleep apnea    Spinal stenosis     Past Surgical History:  Procedure Laterality Date   APPENDECTOMY     ATRIAL FIBRILLATION ABLATION N/A 04/25/2019   Procedure: ATRIAL FIBRILLATION ABLATION;  Surgeon: [name], MD;  Location: [place] [facility];  Service: Cardiovascular;  Laterality: N/A;   AV NODE ABLATION N/A 03/23/2022   Procedure: AV NODE ABLATION;  Surgeon: Vickie Epley, MD;  Location: [place] [facility];  Service: Cardiovascular;  Laterality: N/A;   CARDIOVERSION N/A 08/09/2015   Procedure: CARDIOVERSION;  Surgeon: Jerline Pain, MD;  Location: [place];  Service: Cardiovascular;  Laterality: N/A;   CARDIOVERSION N/A 09/30/2018   Procedure: CARDIOVERSION;  Surgeon: [name], MD;  Location: [place];  Service: Cardiovascular;  Laterality: N/A;   CARDIOVERSION N/A 02/27/2019   Procedure: CARDIOVERSION;  Surgeon: Buford Dresser, MD;  Location: [place];  Service: Cardiovascular;  Laterality: N/A;   CARDIOVERSION N/A 06/07/2019   Procedure: CARDIOVERSION;  Surgeon: [name], MD;  Location: [facility] END[facility];  Service: Cardiovascular;  Laterality: N/A;   CATARACT EXTRACTION W/ INTRAOCULAR LENS  IMPLANT, BILATERAL Bilateral 2016   CYST REMOVAL HAND Bilateral 1990's   "played to much golf"   DILATION AND CURETTAGE OF UTERUS     implantable loop recorder placement  11/20/2019   Medtronic Reveal Linq model OJJ 00 (model number [id number]) implanted in office by Dr Rayann Heman for afib management   LUMBAR LAMINECTOMY  40 yrs ago   "L3-4"   MENISCUS REPAIR Bilateral 2000 and 2010   PACEMAKER IMPLANT N/A 02/06/2022  Procedure: PACEMAKER IMPLANT;  Surgeon: Vickie Epley, MD;  Location: [place] [facility];  Service: Cardiovascular;  Laterality: N/A;   PACEMAKER INSERTION     REPLACEMENT TOTAL KNEE Left 2013   TEE WITHOUT CARDIOVERSION N/A 04/24/2019   Procedure: TRANSESOPHAGEAL ECHOCARDIOGRAM (TEE);  Surgeon: Thayer Headings, MD;  Location: [facility] ENDOSCOPY;  Service: Cardiovascular;  Laterality: N/A;   TONSILECTOMY, ADENOIDECTOMY, BILATERAL MYRINGOTOMY AND TUBES  child   TOTAL ABDOMINAL HYSTERECTOMY  1995   had 2 tumors- benign   TUBAL LIGATION  88 years old    Current Medications: Current  Meds  Medication Sig   acetaminophen (TYLENOL) 325 MG tablet Take 325 mg by mouth every morning.   atorvastatin (LIPITOR) 20 MG tablet TAKE 1 TABLET BY MOUTH  DAILY   bumetanide (BUMEX) 1 MG tablet TAKE 1 TABLET BY MOUTH DAILY   Calcium Carb-Cholecalciferol (CALCIUM 600 + D PO) Take 1 tablet by mouth 2 (two) times daily.   Carboxymethylcellul-Glycerin (LUBRICATING EYE DROPS OP) Place 1 drop into both eyes in the morning and at bedtime.   clindamycin (CLEOCIN) 300 MG capsule Take 1,200 mg by mouth See admin instructions. Before dental work   ELIQUIS 5 MG TABS tablet TAKE ONE TABLET EVERY MORNING AND AT BEDTIME   lisinopril (ZESTRIL) 10 MG tablet Take 1 tablet (10 mg total) by mouth daily.   loratadine (CLARITIN) 10 MG tablet Take 10 mg by mouth daily.    Multiple Vitamins-Minerals (ONE-A-DAY WOMENS 50 PLUS PO) Take 1 tablet by mouth daily.   potassium chloride (KLOR-CON) 10 MEQ tablet TAKE 1 TABLET BY MOUTH IN  THE MORNING AND 2 TABLETS  BY MOUTH IN THE EVENING   Probiotic CAPS Take 1 capsule by mouth daily. Gummy   psyllium (REGULOID) 0.52 g capsule Take 0.52 g by mouth 2 (two) times daily.    [DISCONTINUED] metoprolol succinate (TOPROL-XL) 25 MG 24 hr tablet Take 1 tablet (25 mg total) by mouth daily.     Allergies:   Gabapentin, Lactose intolerance (gi), Penicillins, Sulfa antibiotics, Vancomycin, and Zebeta [bisoprolol fumarate]   Social History   Socioeconomic History   Marital status: Widowed    Spouse name: Not on file   Number of children: 3   Years of education: Not on file   Highest education level: Not on file  Occupational History   Occupation: retired Pharmacist, hospital  Tobacco Use   Smoking status: Never   Smokeless tobacco: Never   Tobacco comments:    never used tobacco  Substance and Sexual Activity   Alcohol use: No   Drug use: No   Sexual activity: Never    Comment: lives at [place], low sodium diet  Other Topics Concern   Not on file  Social History Narrative    Not on file   Social Determinants of Health   Financial Resource Strain: Low Risk  (06/19/2021)   Overall Financial Resource Strain (CARDIA)    Difficulty of Paying Living Expenses: Not hard at all  Food Insecurity: No Food Insecurity (06/19/2021)   Hunger Vital Sign    Worried About Running Out of Food in the Last Year: Never true    [place] in the Last Year: Never true  Transportation Needs: No Transportation Needs (06/19/2021)   PRAPARE - Hydrologist (Medical): No    Lack of Transportation (Non-Medical): No  Physical Activity: Sufficiently Active (06/19/2021)   Exercise Vital Sign    Days of Exercise per Week: 3  days    Minutes of Exercise per Session: 60 min  Stress: No Stress Concern Present (06/19/2021)   [place]    Feeling of Stress : Not at all  Social Connections: Moderately Integrated (06/19/2021)   Social Connection and Isolation Panel [NHANES]    Frequency of Communication with Friends and Family: More than three times a week    Frequency of Social Gatherings with Friends and Family: More than three times a week    Attends Religious Services: More than 4 times per year    Active Member of [employer] or Organizations: Yes    Attends Archivist Meetings: 1 to 4 times per year    Marital Status: Widowed     Family History: The patient's family history includes Atrial fibrillation in her son; COPD in her father; Cancer in her maternal grandfather and paternal grandmother; Cancer (age of onset: 68) in her son; Cancer (age of onset: 4) in her father; Dementia in her mother; Diabetes in her maternal grandmother; Gout in her son and son; Heart attack (age of onset: 43) in her mother; Heart disease in her brother; Hyperlipidemia in her brother, mother, son, and son; Hypertension in her brother; Pernicious anemia in her maternal grandmother and mother; Sleep apnea in  her son; Stroke in her paternal grandfather. There is no history of Colon cancer, Pancreatic cancer, Stomach cancer, Throat cancer, or Liver disease.  ROS:   Please see the history of present illness.    All other systems reviewed and are negative.  EKGs/Labs/Other Studies Reviewed:    The following studies were reviewed today:  May 11, 2022 In clinic device interrogation personally reviewed: Battery longevity 10.8 to 11.5 years Lead parameters stable Ventricular pacing greater than 99% No high ventricular rates Reprogrammed alerts off for ventricular pacing   01/12/2022  Echo: Sonographer Comments: Technically difficult study due to poor echo  windows.  IMPRESSIONS    1. Atrial fibrillation      . Left ventricular ejection fraction, by estimation, is 45 to 50%. The  left ventricle has mildly decreased function. Left ventricular endocardial  border not optimally defined to evaluate regional wall motion. There is  mild concentric left ventricular   hypertrophy. Left ventricular diastolic parameters are indeterminate.   2. Right ventricular systolic function is normal. The right ventricular  size is normal. There is normal pulmonary artery systolic pressure.   3. Left atrial size was moderately dilated.   4. The mitral valve is degenerative. Moderate mitral valve regurgitation.  No evidence of mitral stenosis. Moderate mitral annular calcification.   5. The aortic valve has an indeterminant number of cusps. Aortic valve  regurgitation is not visualized. Aortic valve sclerosis is present, with  no evidence of aortic valve stenosis.   6. The inferior vena cava is normal in size with greater than 50%  respiratory variability, suggesting right atrial pressure of 3 mmHg.   EKG:  EKG is personally reviewed.  05/11/2022:  EKG was not ordered.   Recent Labs: 02/08/2022: B Natriuretic Peptide 313.9; Magnesium 1.9 05/05/2022: ALT 12; BUN 14; Creatinine, Ser 1.01; Hemoglobin 13.6;  Platelets 213.0; Potassium 4.0; Sodium 141; TSH 1.99   Recent Lipid Panel    Component Value Date/Time   CHOL 141 05/05/2022 1512   CHOL 138 04/20/2019 1345   TRIG 121.0 05/05/2022 1512   HDL 43.80 05/05/2022 1512   HDL 41 04/20/2019 1345   CHOLHDL 3 05/05/2022 1512   VLDL 24.2  05/05/2022 1512   LDLCALC 73 05/05/2022 1512   LDLCALC 85 05/14/2020 1413    Physical Exam:    VS:  BP 124/80   Pulse 72   Ht '5\' 3"'$  (1.6 m)   Wt 271 lb 3.2 oz (123 kg)   SpO2 97%   BMI 48.04 kg/m     Wt Readings from Last 3 Encounters:  05/11/22 271 lb 3.2 oz (123 kg)  05/05/22 270 lb 6.4 oz (122.7 kg)  04/13/22 270 lb (122.5 kg)     GEN: Well nourished, well developed in no acute distress HEENT: Normal NECK: No JVD; No carotid bruits LYMPHATICS: No lymphadenopathy CARDIAC: RRR, no murmurs, rubs, gallops.  Prepectoral pocket well healed. RESPIRATORY:  Clear to auscultation without rales, wheezing or rhonchi  ABDOMEN: Soft, non-tender, non-distended MUSCULOSKELETAL:  No edema; No deformity  SKIN: Warm and dry NEUROLOGIC:  Alert and oriented x 3 PSYCHIATRIC:  Normal affect        ASSESSMENT:    1. Permanent atrial fibrillation ([place])   2. Chronic diastolic HF (heart failure) ([place])   3. Morbid obesity ([place])   4. Obstructive sleep apnea   5. Pacemaker    PLAN:    In order of problems listed above:  #Permanent atrial fibrillation #Permanent pacemaker in situ #Status post AV junction ablation Doing well after her pacemaker implant and AV nodal ablation on March 23, 2022.  Her site has healed well.  She is feeling much better with an improved exercise tolerance.  We will continue remote monitoring of her permanent pacemaker and plan to see her back in about 1 year or sooner as needed.  She will continue taking Eliquis for stroke prophylaxis.   Stop metoprolol.   #Obesity Encourage continued exercise with walking and water aerobics.  #Permanent pacemaker in situ Device functioning  appropriately.  Continue remote monitoring.  Follow-up 1 year with APP.   Medication Adjustments/Labs and Tests Ordered: Current medicines are reviewed at length with the patient today.  Concerns regarding medicines are outlined above.  No orders of the defined types were placed in this encounter.  No orders of the defined types were placed in this encounter.   I,Mathew Stumpf,acting as a Education administrator for Vickie Epley, MD.,have documented all relevant documentation on the behalf of Vickie Epley, MD,as directed by  Vickie Epley, MD while in the presence of Vickie Epley, MD.  I, Vickie Epley, MD, have reviewed all documentation for this visit. The documentation on 05/11/22 for the exam, diagnosis, procedures, and orders are all accurate and complete.   Signed, Lars Mage, MD, [facility], [facility] 05/11/2022 3:06 PM    Electrophysiology [facility]

## 2022-05-12 ENCOUNTER — Ambulatory Visit (INDEPENDENT_AMBULATORY_CARE_PROVIDER_SITE_OTHER): Payer: Medicare Other

## 2022-05-12 ENCOUNTER — Other Ambulatory Visit: Payer: Self-pay | Admitting: Family Medicine

## 2022-05-12 DIAGNOSIS — I4821 Permanent atrial fibrillation: Secondary | ICD-10-CM

## 2022-05-15 LAB — CUP PACEART REMOTE DEVICE CHECK
Battery Remaining Longevity: 134 mo
Battery Remaining Percentage: 95.5 %
Battery Voltage: 3.04 V
Brady Statistic RV Percent Paced: 99 %
Date Time Interrogation Session: 20230912030051
Implantable Lead Implant Date: 20230609
Implantable Lead Location: 753860
Implantable Pulse Generator Implant Date: 20230609
Lead Channel Impedance Value: 510 Ohm
Lead Channel Pacing Threshold Amplitude: 0.625 V
Lead Channel Pacing Threshold Pulse Width: 0.5 ms
Lead Channel Sensing Intrinsic Amplitude: 12 mV
Lead Channel Setting Pacing Amplitude: 0.875
Lead Channel Setting Pacing Pulse Width: 0.5 ms
Lead Channel Setting Sensing Sensitivity: 4 mV
Pulse Gen Model: 1272
Pulse Gen Serial Number: 8068284

## 2022-05-18 ENCOUNTER — Other Ambulatory Visit (HOSPITAL_BASED_OUTPATIENT_CLINIC_OR_DEPARTMENT_OTHER): Payer: Self-pay

## 2022-05-18 MED ORDER — AREXVY 120 MCG/0.5ML IM SUSR
0.5000 mL | Freq: Once | INTRAMUSCULAR | 0 refills | Status: AC
  Filled 2022-05-18: qty 0.5, 1d supply, fill #0

## 2022-05-22 ENCOUNTER — Other Ambulatory Visit (HOSPITAL_BASED_OUTPATIENT_CLINIC_OR_DEPARTMENT_OTHER): Payer: Self-pay

## 2022-05-22 DIAGNOSIS — Z23 Encounter for immunization: Secondary | ICD-10-CM | POA: Diagnosis not present

## 2022-05-22 MED ORDER — FLUAD QUADRIVALENT 0.5 ML IM PRSY
PREFILLED_SYRINGE | INTRAMUSCULAR | 0 refills | Status: DC
  Filled 2022-05-22: qty 0.5, 1d supply, fill #0

## 2022-05-28 NOTE — Progress Notes (Signed)
Remote pacemaker transmission.   

## 2022-06-22 ENCOUNTER — Other Ambulatory Visit: Payer: Self-pay | Admitting: Family Medicine

## 2022-06-25 ENCOUNTER — Ambulatory Visit (INDEPENDENT_AMBULATORY_CARE_PROVIDER_SITE_OTHER): Payer: Medicare Other | Admitting: *Deleted

## 2022-06-25 DIAGNOSIS — Z0000 Encounter for general adult medical examination without abnormal findings: Secondary | ICD-10-CM | POA: Diagnosis not present

## 2022-06-25 NOTE — Patient Instructions (Signed)
Ms. Trettin , Thank you for taking time to come for your Medicare Wellness Visit. I appreciate your ongoing commitment to your health goals. Please review the following plan we discussed and let me know if I can assist you in the future.   These are the goals we discussed:  Goals      Maintain current health and independence     Track and Manage Fluids and Swelling-Heart Failure     Timeframe:  Long-Range Goal Priority:  High Start Date:  10/25/2020                           Expected End Date:   04/24/2021                    Follow Up Date 01/28/21   - call office if I gain more than 2 pounds in one day or 5 pounds in one week - use salt in moderation - watch for swelling in feet, ankles and legs every day - weigh myself daily    Why is this important?   It is important to check your weight daily and watch how much salt and liquids you have.  It will help you to manage your heart failure.    Notes:         This is a list of the screening recommended for you and due dates:  Health Maintenance  Topic Date Due   COVID-19 Vaccine (7 - Moderna risk series) 08/28/2022*   Tetanus Vaccine  07/31/2022   Medicare Annual Wellness Visit  06/26/2023   Pneumonia Vaccine  Completed   Flu Shot  Completed   DEXA scan (bone density measurement)  Completed   Zoster (Shingles) Vaccine  Completed   HPV Vaccine  Aged Out   Colon Cancer Screening  Discontinued  *Topic was postponed. The date shown is not the original due date.     Next appointment: Follow up in one year for your annual wellness visit    Preventive Care 65 Years and Older, Female Preventive care refers to lifestyle choices and visits with your health care provider that can promote health and wellness. What does preventive care include? A yearly physical exam. This is also called an annual well check. Dental exams once or twice a year. Routine eye exams. Ask your health care provider how often you should have your eyes  checked. Personal lifestyle choices, including: Daily care of your teeth and gums. Regular physical activity. Eating a healthy diet. Avoiding tobacco and drug use. Limiting alcohol use. Practicing safe sex. Taking low-dose aspirin every day. Taking vitamin and mineral supplements as recommended by your health care provider. What happens during an annual well check? The services and screenings done by your health care provider during your annual well check will depend on your age, overall health, lifestyle risk factors, and family history of disease. Counseling  Your health care provider may ask you questions about your: Alcohol use. Tobacco use. Drug use. Emotional well-being. Home and relationship well-being. Sexual activity. Eating habits. History of falls. Memory and ability to understand (cognition). Work and work Statistician. Reproductive health. Screening  You may have the following tests or measurements: Height, weight, and BMI. Blood pressure. Lipid and cholesterol levels. These may be checked every 5 years, or more frequently if you are over 47 years old. Skin check. Lung cancer screening. You may have this screening every year starting at age 43 if you have  a 30-pack-year history of smoking and currently smoke or have quit within the past 15 years. Fecal occult blood test (FOBT) of the stool. You may have this test every year starting at age 49. Flexible sigmoidoscopy or colonoscopy. You may have a sigmoidoscopy every 5 years or a colonoscopy every 10 years starting at age 20. Hepatitis C blood test. Hepatitis B blood test. Sexually transmitted disease (STD) testing. Diabetes screening. This is done by checking your blood sugar (glucose) after you have not eaten for a while (fasting). You may have this done every 1-3 years. Bone density scan. This is done to screen for osteoporosis. You may have this done starting at age 41. Mammogram. This may be done every 1-2  years. Talk to your health care provider about how often you should have regular mammograms. Talk with your health care provider about your test results, treatment options, and if necessary, the need for more tests. Vaccines  Your health care provider may recommend certain vaccines, such as: Influenza vaccine. This is recommended every year. Tetanus, diphtheria, and acellular pertussis (Tdap, Td) vaccine. You may need a Td booster every 10 years. Zoster vaccine. You may need this after age 44. Pneumococcal 13-valent conjugate (PCV13) vaccine. One dose is recommended after age 37. Pneumococcal polysaccharide (PPSV23) vaccine. One dose is recommended after age 40. Talk to your health care provider about which screenings and vaccines you need and how often you need them. This information is not intended to replace advice given to you by your health care provider. Make sure you discuss any questions you have with your health care provider. Document Released: 09/13/2015 Document Revised: 05/06/2016 Document Reviewed: 06/18/2015 Elsevier Interactive Patient Education  2017 [place] Prevention in the Home Falls can cause injuries. They can happen to people of all ages. There are many things you can do to make your home safe and to help prevent falls. What can I do on the outside of my home? Regularly fix the edges of walkways and driveways and fix any cracks. Remove anything that might make you trip as you walk through a door, such as a raised step or threshold. Trim any bushes or trees on the path to your home. Use bright outdoor lighting. Clear any walking paths of anything that might make someone trip, such as rocks or tools. Regularly check to see if handrails are loose or broken. Make sure that both sides of any steps have handrails. Any raised decks and porches should have guardrails on the edges. Have any leaves, snow, or ice cleared regularly. Use sand or salt on walking paths  during winter. Clean up any spills in your garage right away. This includes oil or grease spills. What can I do in the bathroom? Use night lights. Install grab bars by the toilet and in the tub and shower. Do not use towel bars as grab bars. Use non-skid mats or decals in the tub or shower. If you need to sit down in the shower, use a plastic, non-slip stool. Keep the floor dry. Clean up any water that spills on the floor as soon as it happens. Remove soap buildup in the tub or shower regularly. Attach bath mats securely with double-sided non-slip rug tape. Do not have throw rugs and other things on the floor that can make you trip. What can I do in the bedroom? Use night lights. Make sure that you have a light by your bed that is easy to reach. Do not use any  sheets or blankets that are too big for your bed. They should not hang down onto the floor. Have a firm chair that has side arms. You can use this for support while you get dressed. Do not have throw rugs and other things on the floor that can make you trip. What can I do in the kitchen? Clean up any spills right away. Avoid walking on wet floors. Keep items that you use a lot in easy-to-reach places. If you need to reach something above you, use a strong step stool that has a grab bar. Keep electrical cords out of the way. Do not use floor polish or wax that makes floors slippery. If you must use wax, use non-skid floor wax. Do not have throw rugs and other things on the floor that can make you trip. What can I do with my stairs? Do not leave any items on the stairs. Make sure that there are handrails on both sides of the stairs and use them. Fix handrails that are broken or loose. Make sure that handrails are as long as the stairways. Check any carpeting to make sure that it is firmly attached to the stairs. Fix any carpet that is loose or worn. Avoid having throw rugs at the top or bottom of the stairs. If you do have throw  rugs, attach them to the floor with carpet tape. Make sure that you have a light switch at the top of the stairs and the bottom of the stairs. If you do not have them, ask someone to add them for you. What else can I do to help prevent falls? Wear shoes that: Do not have high heels. Have rubber bottoms. Are comfortable and fit you well. Are closed at the toe. Do not wear sandals. If you use a stepladder: Make sure that it is fully opened. Do not climb a closed stepladder. Make sure that both sides of the stepladder are locked into place. Ask someone to hold it for you, if possible. Clearly mark and make sure that you can see: Any grab bars or handrails. First and last steps. Where the edge of each step is. Use tools that help you move around (mobility aids) if they are needed. These include: Canes. Walkers. Scooters. Crutches. Turn on the lights when you go into a dark area. Replace any light bulbs as soon as they burn out. Set up your furniture so you have a clear path. Avoid moving your furniture around. If any of your floors are uneven, fix them. If there are any pets around you, be aware of where they are. Review your medicines with your doctor. Some medicines can make you feel dizzy. This can increase your chance of falling. Ask your doctor what other things that you can do to help prevent falls. This information is not intended to replace advice given to you by your health care provider. Make sure you discuss any questions you have with your health care provider. Document Released: 06/13/2009 Document Revised: 01/23/2016 Document Reviewed: 09/21/2014 Elsevier Interactive Patient Education  2017 [employer].

## 2022-06-25 NOTE — Progress Notes (Signed)
Subjective:   Tracy Miles is a 88 y.o. female who presents for Medicare Annual (Subsequent) preventive examination.  I connected with  [name] on 06/25/22 by a audio enabled telemedicine application and verified that I am speaking with the correct person using two identifiers.  Patient Location: Home  Provider Location: Office/Clinic  I discussed the limitations of evaluation and management by telemedicine. The patient expressed understanding and agreed to proceed.   Review of Systems    Defer to PCP Cardiac Risk Factors include: advanced age (>59mn, >>59women);dyslipidemia;hypertension     Objective:    Today's Vitals   06/25/22 1541  PainSc: 4    There is no height or weight on file to calculate BMI.     06/25/2022    3:40 PM 03/23/2022    2:54 PM 02/08/2022    8:37 AM 02/06/2022   11:56 AM 06/19/2021    3:43 PM 06/07/2019   11:07 AM 04/25/2019    6:23 AM  Advanced Directives  Does Patient Have a Medical Advance Directive? Yes Yes Yes Yes Yes Yes Yes  Type of AParamedicof [place]Living will Living will   [place] will [place] City[place] will [place] will  Does patient want to make changes to medical advance directive? No - Patient declined      No - Patient declined  Copy of [place] Chart? Yes - validated most recent copy scanned in chart (See row information)    Yes - validated most recent copy scanned in chart (See row information) Yes - validated most recent copy scanned in chart (See row information)     Current Medications (verified) Outpatient Encounter Medications as of 06/25/2022  Medication Sig   acetaminophen (TYLENOL) 325 MG tablet Take 325 mg by mouth every morning.   atorvastatin (LIPITOR) 20 MG tablet TAKE 1 TABLET BY MOUTH  DAILY   bumetanide (BUMEX) 1 MG tablet TAKE 1 TABLET BY MOUTH DAILY   Calcium Carb-Cholecalciferol  (CALCIUM 600 + D PO) Take 1 tablet by mouth 2 (two) times daily.   Carboxymethylcellul-Glycerin (LUBRICATING EYE DROPS OP) Place 1 drop into both eyes in the morning and at bedtime.   clindamycin (CLEOCIN) 300 MG capsule Take 1,200 mg by mouth See admin instructions. Before dental work   ELIQUIS 5 MG TABS tablet TAKE ONE TABLET EVERY MORNING AND AT BEDTIME   influenza vaccine adjuvanted (FLUAD QUADRIVALENT) 0.5 ML injection Inject into the muscle.   lisinopril (ZESTRIL) 10 MG tablet Take 1 tablet (10 mg total) by mouth daily.   loratadine (CLARITIN) 10 MG tablet Take 10 mg by mouth daily.    Multiple Vitamins-Minerals (ONE-A-DAY WOMENS 50 PLUS PO) Take 1 tablet by mouth daily.   potassium chloride (KLOR-CON) 10 MEQ tablet TAKE 1 TABLET BY MOUTH IN  THE MORNING AND 2 TABLETS  BY MOUTH IN THE EVENING   Probiotic CAPS Take 1 capsule by mouth daily. Gummy   psyllium (REGULOID) 0.52 g capsule Take 0.52 g by mouth 2 (two) times daily.    No facility-administered encounter medications on file as of 06/25/2022.    Allergies (verified) Gabapentin, Lactose intolerance (gi), Penicillins, Sulfa antibiotics, Vancomycin, and Zebeta [bisoprolol fumarate]   History: Past Medical History:  Diagnosis Date   Anemia    Aortic stenosis    Very mild   Benign paroxysmal positional vertigo 12/17/2013   Chicken pox as a child   Colon polyp  Coronary artery disease    DDD (degenerative disc disease) 11/16/2013   Hyperlipidemia    Hypertension    Iron deficiency anemia 11/13/2013   Mumps child and teenager   OA (osteoarthritis) 11/19/2013   S/p L TKR   Obesity    OSA on CPAP    Pancreatitis    post hysterectomy   Persistent atrial fibrillation (HCC)    Personal history of colonic polyps 02/25/2014   Personal history of DVT (deep vein thrombosis) X 2   "left"   Sleep apnea    Spinal stenosis    Past Surgical History:  Procedure Laterality Date   APPENDECTOMY     ATRIAL FIBRILLATION ABLATION N/A  04/25/2019   Procedure: ATRIAL FIBRILLATION ABLATION;  Surgeon: [name], MD;  Location: [facility];  Service: Cardiovascular;  Laterality: N/A;   AV NODE ABLATION N/A 03/23/2022   Procedure: AV NODE ABLATION;  Surgeon: Vickie Epley, MD;  Location: [facility];  Service: Cardiovascular;  Laterality: N/A;   CARDIOVERSION N/A 08/09/2015   Procedure: CARDIOVERSION;  Surgeon: Jerline Pain, MD;  Location: [place];  Service: Cardiovascular;  Laterality: N/A;   CARDIOVERSION N/A 09/30/2018   Procedure: CARDIOVERSION;  Surgeon: [name], MD;  Location: [facility] ENDOSCOPY;  Service: Cardiovascular;  Laterality: N/A;   CARDIOVERSION N/A 02/27/2019   Procedure: CARDIOVERSION;  Surgeon: Buford Dresser, MD;  Location: [facility] ENDOSCOPY;  Service: Cardiovascular;  Laterality: N/A;   CARDIOVERSION N/A 06/07/2019   Procedure: CARDIOVERSION;  Surgeon: [name], MD;  Location: [facility] ENDOSCOPY;  Service: Cardiovascular;  Laterality: N/A;   CATARACT EXTRACTION W/ INTRAOCULAR LENS  IMPLANT, BILATERAL Bilateral 2016   CYST REMOVAL HAND Bilateral 1990's   "played to much golf"   DILATION AND CURETTAGE OF UTERUS     implantable loop recorder placement  11/20/2019   Medtronic Reveal Linq model HWE 99 (model number [id number]) implanted in office by Dr Rayann Heman for afib management   LUMBAR LAMINECTOMY  40 yrs ago   "L3-4"   MENISCUS REPAIR Bilateral 2000 and 2010   PACEMAKER IMPLANT N/A 02/06/2022   Procedure: PACEMAKER IMPLANT;  Surgeon: Vickie Epley, MD;  Location: [place] [facility];  Service: Cardiovascular;  Laterality: N/A;   PACEMAKER INSERTION     REPLACEMENT TOTAL KNEE Left 2013   TEE WITHOUT CARDIOVERSION N/A 04/24/2019   Procedure: TRANSESOPHAGEAL ECHOCARDIOGRAM (TEE);  Surgeon: Acie Fredrickson Wonda Cheng, MD;  Location: [facility] ENDOSCOPY;  Service: Cardiovascular;  Laterality: N/A;   TONSILECTOMY, ADENOIDECTOMY, BILATERAL MYRINGOTOMY AND TUBES  child   TOTAL ABDOMINAL  HYSTERECTOMY  1995   had 2 tumors- benign   TUBAL LIGATION  88 years old   Family History  Problem Relation Age of Onset   Heart attack Mother 35   Hyperlipidemia Mother        ?   Dementia Mother    Pernicious anemia Mother    COPD Father        smoker   Cancer Father 53       prostate   Heart disease Brother        quadruple bipass surgery   Hyperlipidemia Brother    Hypertension Brother    Diabetes Maternal Grandmother        type 2   Pernicious anemia Maternal Grandmother    Gout Son    Atrial fibrillation Son    Hyperlipidemia Son    Cancer Maternal Grandfather        liver   Cancer Paternal Grandmother  lung- doesn't think she smokes?   Stroke Paternal Grandfather    Cancer Son 59       non hodgin's lymphoma   Gout Son    Hyperlipidemia Son    Sleep apnea Son    Colon cancer Neg Hx    Pancreatic cancer Neg Hx    Stomach cancer Neg Hx    Throat cancer Neg Hx    Liver disease Neg Hx    Social History   Socioeconomic History   Marital status: Widowed    Spouse name: Not on file   Number of children: 3   Years of education: Not on file   Highest education level: Not on file  Occupational History   Occupation: retired Pharmacist, hospital  Tobacco Use   Smoking status: Never   Smokeless tobacco: Never   Tobacco comments:    never used tobacco  Substance and Sexual Activity   Alcohol use: No   Drug use: No   Sexual activity: Never    Comment: lives at [place], low sodium diet  Other Topics Concern   Not on file  Social History Narrative   Not on file   Social Determinants of Health   Financial Resource Strain: Low Risk  (06/19/2021)   Overall Financial Resource Strain (CARDIA)    Difficulty of Paying Living Expenses: Not hard at all  Food Insecurity: No Food Insecurity (06/19/2021)   Hunger Vital Sign    Worried About Running Out of Food in the Last Year: Never true    [place] in the Last Year: Never true  Transportation Needs: No  Transportation Needs (06/19/2021)   PRAPARE - Hydrologist (Medical): No    Lack of Transportation (Non-Medical): No  Physical Activity: Sufficiently Active (06/19/2021)   Exercise Vital Sign    Days of Exercise per Week: 3 days    Minutes of Exercise per Session: 60 min  Stress: No Stress Concern Present (06/19/2021)   [place]    Feeling of Stress : Not at all  Social Connections: Moderately Integrated (06/19/2021)   Social Connection and Isolation Panel [NHANES]    Frequency of Communication with Friends and Family: More than three times a week    Frequency of Social Gatherings with Friends and Family: More than three times a week    Attends Religious Services: More than 4 times per year    Active Member of [employer] or Organizations: Yes    Attends Archivist Meetings: 1 to 4 times per year    Marital Status: Widowed    Tobacco Counseling Counseling given: Not Answered Tobacco comments: never used tobacco   Clinical Intake:  Pre-visit preparation completed: Yes  Pain : 0-10 Pain Score: 4  Pain Type: Chronic pain Pain Location: Back Pain Descriptors / Indicators: Aching Pain Onset: More than a month ago Pain Frequency: Constant        How often do you need to have someone help you when you read instructions, pamphlets, or other written materials from your doctor or pharmacy?: 1 - Never  Diabetic? No  Activities of Daily Living    06/25/2022    3:44 PM  In your present state of health, do you have any difficulty performing the following activities:  Hearing? 1  Comment has noticed slight hearing loss  Vision? 0  Difficulty concentrating or making decisions? 0  Walking or climbing stairs? 0  Dressing or bathing?  0  Doing errands, shopping? 0  Preparing Food and eating ? N  Using the Toilet? N  In the past six months, have you accidently leaked urine? N   Do you have problems with loss of bowel control? N  Managing your Medications? N  Managing your Finances? N  Housekeeping or managing your Housekeeping? N    Patient Care Team: [name], MD as PCP - General (Family Medicine) Vickie Epley, MD as PCP - Electrophysiology (Cardiology) Clayborne Artist, NP as Nurse Practitioner (Cardiology) Rigoberto Noel, MD as Consulting Physician (Pulmonary Disease) Melrose Nakayama, MD as Consulting Physician (Orthopedic Surgery) Edythe Clarity, [facility] (Pharmacist)  Indicate any recent Medical Services you may have received from other than [facility] providers in the past year (date may be approximate).     Assessment:   This is a routine wellness examination for [place].  Hearing/Vision screen No results found.  Dietary issues and exercise activities discussed: Current Exercise Habits: Home exercise routine, Type of exercise: strength training/weights;walking (walks and lifts weights in the pool), Time (Minutes): 60, Frequency (Times/Week): 3, Weekly Exercise (Minutes/Week): 180, Intensity: Mild, Exercise limited by: None identified   Goals Addressed   None    Depression Screen    06/25/2022    3:43 PM 05/05/2022    2:36 PM 01/20/2022    2:04 PM 06/19/2021    3:48 PM 05/19/2021    1:55 PM 07/22/2020    1:29 PM 11/07/2019    1:29 PM  PHQ 2/9 Scores  PHQ - 2 Score 0 0 0 0 0 0 0  PHQ- 9 Score  0         Fall Risk    06/25/2022    3:43 PM 05/05/2022    2:36 PM 01/20/2022    2:03 PM 06/19/2021    3:47 PM 05/19/2021    1:55 PM  Fall Risk   Falls in the past year? 0 0 0 0 0  Number falls in past yr: 0 0 0 0 0  Injury with Fall? 0 1 0 0 0  Risk for fall due to : No Fall Risks  No Fall Risks    Follow up Falls evaluation completed  Falls evaluation completed Falls prevention discussed     [place]:  Any stairs in or around the home? No  If so, are there any without handrails?  No stairs Home  free of loose throw rugs in walkways, pet beds, electrical cords, etc? Yes  Adequate lighting in your home to reduce risk of falls? Yes   ASSISTIVE DEVICES UTILIZED TO PREVENT FALLS:  Life alert? No  Use of a cane, walker or w/c? No  Grab bars in the bathroom? Yes  Shower chair or bench in shower? No  Elevated toilet seat or a handicapped toilet? Yes   TIMED UP AND GO:  Was the test performed?  No, audio visit .    Cognitive Function:    04/21/2018    1:31 PM 09/29/2016    2:15 PM  MMSE - Mini Mental State Exam  Orientation to time 5 5  Orientation to Place 5 5  Registration 3 3  Attention/ Calculation 5 5  Recall 2 3  Language- name 2 objects 2 2  Language- repeat 1 1  Language- follow 3 step command 3 3  Language- read & follow direction 1 1  Write a sentence 1 1  Copy design 1 1  Total score 29  30        06/25/2022    4:00 PM  6CIT Screen  What Year? 0 points  What month? 0 points  What time? 0 points  Count back from 20 2 points  Months in reverse 0 points  Repeat phrase 2 points  Total Score 4 points    Immunizations Immunization History  Administered Date(s) Administered   Fluad Quad(high Dose 65+) 06/16/2019, 05/19/2021, 05/22/2022   Influenza, High Dose Seasonal PF 05/11/2017, 05/24/2018   Influenza,inj,Quad PF,6+ Mos 04/30/2016   Influenza-Unspecified 05/31/2013, 05/01/2014, 06/11/2015   Moderna Covid-19 Vaccine Bivalent Booster 47yr & up 01/20/2022   Moderna Sars-Covid-2 Vaccination 09/13/2019, 10/10/2019, 07/03/2020, 12/18/2020   Pfizer Covid-19 Vaccine Bivalent Booster 190yr& up 06/12/2021   Pneumococcal Conjugate-13 04/02/2014   Pneumococcal Polysaccharide-23 08/31/1998, 09/06/2015   Respiratory Syncytial Virus Vaccine,Recomb Aduvanted(Arexvy) 05/18/2022   Tdap 07/31/2012   Zoster Recombinat (Shingrix) 04/19/2018, 06/27/2018    TDAP status: Up to date  Flu Vaccine status: Up to date  Pneumococcal vaccine status: Up to date  Covid-19  vaccine status: Information provided on how to obtain vaccines.   Qualifies for Shingles Vaccine? Yes   Zostavax completed No   Shingrix Completed?: Yes  Screening Tests Health Maintenance  Topic Date Due   Medicare Annual Wellness (AWV)  07/19/2022   COVID-19 Vaccine (7 - Moderna risk series) 08/28/2022 (Originally 03/17/2022)   TETANUS/TDAP  07/31/2022   Pneumonia Vaccine 88Years old  Completed   INFLUENZA VACCINE  Completed   DEXA SCAN  Completed   Zoster Vaccines- Shingrix  Completed   HPV VACCINES  Aged Out   COLONOSCOPY (Pts 45-498yrnsurance coverage will need to be confirmed)  Discontinued    Health Maintenance  Health Maintenance Due  Topic Date Due   Medicare Annual Wellness (AWV)  07/19/2022    Colorectal cancer screening: No longer required.   Mammogram status: Completed 11/10/21. Repeat every year  Bone Density status: pt prefers not to have them done.  Lung Cancer Screening: (Low Dose CT Chest recommended if Age 28-76-80ars, 30 pack-year currently smoking OR have quit w/in 15years.) does not qualify.   Lung Cancer Screening Referral: N/a  Additional Screening:  Hepatitis C Screening: does not qualify  Vision Screening: Recommended annual ophthalmology exams for early detection of glaucoma and other disorders of the eye. Is the patient up to date with their annual eye exam?  Yes  Who is the provider or what is the name of the office in which the patient attends annual eye exams? [facility] pt is not established with a provider, would they like to be referred to a provider to establish care? No .   Dental Screening: Recommended annual dental exams for proper oral hygiene  Community Resource Referral / Chronic Care Management: CRR required this visit?  No   CCM required this visit?  No      Plan:     I have personally reviewed and noted the following in the patient's chart:   Medical and social history Use of alcohol, tobacco or  illicit drugs  Current medications and supplements including opioid prescriptions. Patient is not currently taking opioid prescriptions. Functional ability and status Nutritional status Physical activity Advanced directives List of other physicians Hospitalizations, surgeries, and ER visits in previous 12 months Vitals Screenings to include cognitive, depression, and falls Referrals and appointments  In addition, I have reviewed and discussed with patient certain preventive protocols, quality metrics, and best practice recommendations. A written personalized  care plan for preventive services as well as general preventive health recommendations were provided to patient.   Due to this being a telephonic visit, the after visit summary with patients personalized plan was offered to patient via mail or my-chart. Patient would like to access on my-chart.  Beatris Ship, [place]   06/25/2022   Nurse Notes: None

## 2022-06-30 ENCOUNTER — Other Ambulatory Visit: Payer: Self-pay | Admitting: Family Medicine

## 2022-07-14 ENCOUNTER — Other Ambulatory Visit (HOSPITAL_BASED_OUTPATIENT_CLINIC_OR_DEPARTMENT_OTHER): Payer: Self-pay

## 2022-07-14 DIAGNOSIS — Z23 Encounter for immunization: Secondary | ICD-10-CM | POA: Diagnosis not present

## 2022-07-14 MED ORDER — COMIRNATY 30 MCG/0.3ML IM SUSY
PREFILLED_SYRINGE | INTRAMUSCULAR | 0 refills | Status: DC
  Filled 2022-07-14: qty 0.3, 1d supply, fill #0

## 2022-07-15 ENCOUNTER — Other Ambulatory Visit (HOSPITAL_COMMUNITY): Payer: Self-pay | Admitting: Nurse Practitioner

## 2022-08-11 ENCOUNTER — Ambulatory Visit (INDEPENDENT_AMBULATORY_CARE_PROVIDER_SITE_OTHER): Payer: Medicare Other

## 2022-08-11 DIAGNOSIS — I4819 Other persistent atrial fibrillation: Secondary | ICD-10-CM | POA: Diagnosis not present

## 2022-08-11 LAB — CUP PACEART REMOTE DEVICE CHECK
Battery Remaining Longevity: 131 mo
Battery Remaining Percentage: 95.5 %
Battery Voltage: 3.02 V
Brady Statistic RV Percent Paced: 99 %
Date Time Interrogation Session: 20231212020023
Implantable Lead Connection Status: 753985
Implantable Lead Implant Date: 20230609
Implantable Lead Location: 753860
Implantable Pulse Generator Implant Date: 20230609
Lead Channel Impedance Value: 530 Ohm
Lead Channel Pacing Threshold Amplitude: 0.625 V
Lead Channel Pacing Threshold Pulse Width: 0.5 ms
Lead Channel Sensing Intrinsic Amplitude: 12 mV
Lead Channel Setting Pacing Amplitude: 0.875
Lead Channel Setting Pacing Pulse Width: 0.5 ms
Lead Channel Setting Sensing Sensitivity: 4 mV
Pulse Gen Model: 1272
Pulse Gen Serial Number: 8068284

## 2022-09-07 NOTE — Assessment & Plan Note (Signed)
Well controlled, no changes to meds. Encouraged heart healthy diet such as the DASH diet and exercise as tolerated.  °

## 2022-09-07 NOTE — Assessment & Plan Note (Signed)
hgba1c acceptable, minimize simple carbs. Increase exercise as tolerated.  

## 2022-09-07 NOTE — Assessment & Plan Note (Signed)
Tolerating eliquis, rate controlled

## 2022-09-07 NOTE — Assessment & Plan Note (Signed)
Tolerating statin, encouraged heart healthy diet, avoid trans fats, minimize simple carbs and saturated fats. Increase exercise as tolerated 

## 2022-09-07 NOTE — Assessment & Plan Note (Signed)
No recent exacerbation 

## 2022-09-08 ENCOUNTER — Ambulatory Visit (INDEPENDENT_AMBULATORY_CARE_PROVIDER_SITE_OTHER): Payer: Medicare Other | Admitting: Family Medicine

## 2022-09-08 ENCOUNTER — Other Ambulatory Visit: Payer: Self-pay

## 2022-09-08 VITALS — BP 122/78 | HR 75 | Temp 97.5°F | Resp 16 | Ht 63.0 in | Wt 280.4 lb

## 2022-09-08 DIAGNOSIS — R739 Hyperglycemia, unspecified: Secondary | ICD-10-CM | POA: Diagnosis not present

## 2022-09-08 DIAGNOSIS — I10 Essential (primary) hypertension: Secondary | ICD-10-CM | POA: Diagnosis not present

## 2022-09-08 DIAGNOSIS — R300 Dysuria: Secondary | ICD-10-CM

## 2022-09-08 DIAGNOSIS — I4821 Permanent atrial fibrillation: Secondary | ICD-10-CM

## 2022-09-08 DIAGNOSIS — I5032 Chronic diastolic (congestive) heart failure: Secondary | ICD-10-CM | POA: Diagnosis not present

## 2022-09-08 DIAGNOSIS — Z634 Disappearance and death of family member: Secondary | ICD-10-CM | POA: Diagnosis not present

## 2022-09-08 DIAGNOSIS — F4321 Adjustment disorder with depressed mood: Secondary | ICD-10-CM

## 2022-09-08 DIAGNOSIS — E782 Mixed hyperlipidemia: Secondary | ICD-10-CM | POA: Diagnosis not present

## 2022-09-08 MED ORDER — LISINOPRIL 10 MG PO TABS: 10.0000 mg | ORAL_TABLET | Freq: Every day | ORAL | 1 refills | Status: DC

## 2022-09-08 MED ORDER — BUMETANIDE 1 MG PO TABS: 1.0000 mg | ORAL_TABLET | Freq: Every day | ORAL | 1 refills | Status: DC

## 2022-09-08 MED ORDER — ATORVASTATIN CALCIUM 20 MG PO TABS: 20.0000 mg | ORAL_TABLET | Freq: Every day | ORAL | 3 refills | Status: DC

## 2022-09-08 NOTE — Assessment & Plan Note (Signed)
Her 87 year old died unexpectedly right before christmas. She is tearful and is managing with the support of her family she does not feel she needs medication or counseling at this time.

## 2022-09-08 NOTE — Progress Notes (Signed)
Subjective:   By signing my name below, I, [name], attest that this documentation has been prepared under the direction and in the presence of [name], MD., 09/08/2022.   Patient ID: Tracy Miles, female    DOB: 1933/09/22, 89 y.o.   MRN: [id number]  Chief Complaint  Patient presents with   Follow-up    Follow up   HPI Patient is in today for an office visit. She denies CP/palpitations/SOB/HA/congestion/ fevers/or GI symptoms.  Depression/Grief Patient is visibly upset during today's visit and is grieving the loss of her son Marya Amsler who passed away in 2022/09/09 at 89 years-old.   Immunizations She is interested in receiving a Tetanus immunization today at the pharmacy.  UTI She suspects she has a UTI due to urinary frequency and burning.  Left Foot Coldness Patient reports that her left foot is becoming abnormally cold at night, which is causing her to awaken. She underwent an AV node ablation on 03/23/2022 under Dr. Quentin Ore and reports she feels physically better since the procedure. However, she suspects a circulatory issue is causing her left foot to become cold at night.  Past Medical History:  Diagnosis Date   Anemia    Aortic stenosis    Very mild   Benign paroxysmal positional vertigo 12/17/2013   Chicken pox as a child   Colon polyp    Coronary artery disease    DDD (degenerative disc disease) 11/16/2013   Hyperlipidemia    Hypertension    Iron deficiency anemia 11/13/2013   Mumps child and teenager   OA (osteoarthritis) 11/19/2013   S/p L TKR   Obesity    OSA on CPAP    Pancreatitis    post hysterectomy   Persistent atrial fibrillation (HCC)    Personal history of colonic polyps 02/25/2014   Personal history of DVT (deep vein thrombosis) X 2   "left"   Sleep apnea    Spinal stenosis    Past Surgical History:  Procedure Laterality Date   APPENDECTOMY     ATRIAL FIBRILLATION ABLATION N/A 04/25/2019   Procedure: ATRIAL FIBRILLATION ABLATION;   Surgeon: [name], MD;  Location: [facility];  Service: Cardiovascular;  Laterality: N/A;   AV NODE ABLATION N/A 03/23/2022   Procedure: AV NODE ABLATION;  Surgeon: Vickie Epley, MD;  Location: [place] [facility];  Service: Cardiovascular;  Laterality: N/A;   CARDIOVERSION N/A 08/09/2015   Procedure: CARDIOVERSION;  Surgeon: Jerline Pain, MD;  Location: [place];  Service: Cardiovascular;  Laterality: N/A;   CARDIOVERSION N/A 09/30/2018   Procedure: CARDIOVERSION;  Surgeon: [name], MD;  Location: [place];  Service: Cardiovascular;  Laterality: N/A;   CARDIOVERSION N/A 02/27/2019   Procedure: CARDIOVERSION;  Surgeon: Buford Dresser, MD;  Location: [place];  Service: Cardiovascular;  Laterality: N/A;   CARDIOVERSION N/A 06/07/2019   Procedure: CARDIOVERSION;  Surgeon: [name], MD;  Location: [facility];  Service: Cardiovascular;  Laterality: N/A;   CATARACT EXTRACTION W/ INTRAOCULAR LENS  IMPLANT, BILATERAL Bilateral 2016   CYST REMOVAL HAND Bilateral 1990's   "played to much golf"   DILATION AND CURETTAGE OF UTERUS     implantable loop recorder placement  11/20/2019   Medtronic Reveal Linq model TMA 26 (model number [id number]) implanted in office by Dr Rayann Heman for afib management   LUMBAR LAMINECTOMY  40 yrs ago   "L3-4"   MENISCUS REPAIR Bilateral 2000 and 2010   PACEMAKER IMPLANT N/A 02/06/2022   Procedure: PACEMAKER  IMPLANT;  Surgeon: Vickie Epley, MD;  Location: [place] [facility];  Service: Cardiovascular;  Laterality: N/A;   PACEMAKER INSERTION     REPLACEMENT TOTAL KNEE Left 2013   TEE WITHOUT CARDIOVERSION N/A 04/24/2019   Procedure: TRANSESOPHAGEAL ECHOCARDIOGRAM (TEE);  Surgeon: Acie Fredrickson Wonda Cheng, MD;  Location: [facility];  Service: Cardiovascular;  Laterality: N/A;   TONSILECTOMY, ADENOIDECTOMY, BILATERAL MYRINGOTOMY AND TUBES  child   TOTAL ABDOMINAL HYSTERECTOMY  1995   had 2 tumors- benign   TUBAL  LIGATION  89 years old   Family History  Problem Relation Age of Onset   Heart attack Mother 75   Hyperlipidemia Mother        ?   Dementia Mother    Pernicious anemia Mother    COPD Father        smoker   Cancer Father 84       prostate   Heart disease Brother        quadruple bipass surgery   Hyperlipidemia Brother    Hypertension Brother    Diabetes Maternal Grandmother        type 2   Pernicious anemia Maternal Grandmother    Gout Son    Atrial fibrillation Son    Hyperlipidemia Son    Cancer Maternal Grandfather        liver   Cancer Paternal Grandmother        lung- doesn't think she smokes?   Stroke Paternal Grandfather    Cancer Son 72       non hodgin's lymphoma   Gout Son    Hyperlipidemia Son    Sleep apnea Son    Colon cancer Neg Hx    Pancreatic cancer Neg Hx    Stomach cancer Neg Hx    Throat cancer Neg Hx    Liver disease Neg Hx    Social History   Socioeconomic History   Marital status: Widowed    Spouse name: Not on file   Number of children: 3   Years of education: Not on file   Highest education level: Not on file  Occupational History   Occupation: retired Pharmacist, hospital  Tobacco Use   Smoking status: Never   Smokeless tobacco: Never   Tobacco comments:    never used tobacco  Substance and Sexual Activity   Alcohol use: No   Drug use: No   Sexual activity: Never    Comment: lives at [place], low sodium diet  Other Topics Concern   Not on file  Social History Narrative   Not on file   Social Determinants of Health   Financial Resource Strain: Low Risk  (06/19/2021)   Overall Financial Resource Strain (CARDIA)    Difficulty of Paying Living Expenses: Not hard at all  Food Insecurity: No Food Insecurity (06/19/2021)   Hunger Vital Sign    Worried About Running Out of Food in the Last Year: Never true    [place] in the Last Year: Never true  Transportation Needs: No Transportation Needs (06/19/2021)   PRAPARE -  Hydrologist (Medical): No    Lack of Transportation (Non-Medical): No  Physical Activity: Sufficiently Active (06/19/2021)   Exercise Vital Sign    Days of Exercise per Week: 3 days    Minutes of Exercise per Session: 60 min  Stress: No Stress Concern Present (06/19/2021)   [place]    Feeling of Stress :  Not at all  Social Connections: Moderately Integrated (06/19/2021)   Social Connection and Isolation Panel [NHANES]    Frequency of Communication with Friends and Family: More than three times a week    Frequency of Social Gatherings with Friends and Family: More than three times a week    Attends Religious Services: More than 4 times per year    Active Member of [employer] or Organizations: Yes    Attends Archivist Meetings: 1 to 4 times per year    Marital Status: Widowed  Intimate Partner Violence: Not At Risk (06/19/2021)   Humiliation, Afraid, Rape, and Kick questionnaire    Fear of Current or Ex-Partner: No    Emotionally Abused: No    Physically Abused: No    Sexually Abused: No   Outpatient Medications Prior to Visit  Medication Sig Dispense Refill   acetaminophen (TYLENOL) 325 MG tablet Take 325 mg by mouth every morning.     Calcium Carb-Cholecalciferol (CALCIUM 600 + D PO) Take 1 tablet by mouth 2 (two) times daily.     Carboxymethylcellul-Glycerin (LUBRICATING EYE DROPS OP) Place 1 drop into both eyes in the morning and at bedtime.     clindamycin (CLEOCIN) 300 MG capsule Take 1,200 mg by mouth See admin instructions. Before dental work     ELIQUIS 5 MG TABS tablet TAKE ONE TABLET EVERY MORNING AND AT BEDTIME 180 tablet 1   influenza vaccine adjuvanted (FLUAD QUADRIVALENT) 0.5 ML injection Inject into the muscle. 0.5 mL 0   loratadine (CLARITIN) 10 MG tablet Take 10 mg by mouth daily.      Multiple Vitamins-Minerals (ONE-A-DAY WOMENS 50 PLUS PO) Take 1 tablet by mouth  daily.     potassium chloride (KLOR-CON) 10 MEQ tablet TAKE 1 TABLET BY MOUTH IN THE  MORNING AND 2 TABLETS BY MOUTH  IN THE EVENING 270 tablet 3   Probiotic CAPS Take 1 capsule by mouth daily. Gummy     psyllium (REGULOID) 0.52 g capsule Take 0.52 g by mouth 2 (two) times daily.      atorvastatin (LIPITOR) 20 MG tablet TAKE 1 TABLET BY MOUTH  DAILY 90 tablet 3   bumetanide (BUMEX) 1 MG tablet TAKE 1 TABLET BY MOUTH DAILY 90 tablet 1   COVID-19 mRNA vaccine 2023-2024 (COMIRNATY) syringe Inject into the muscle. 0.3 mL 0   lisinopril (ZESTRIL) 10 MG tablet Take 1 tablet (10 mg total) by mouth daily. 90 tablet 1   No facility-administered medications prior to visit.   Allergies  Allergen Reactions   Gabapentin Swelling    Eyes and Face   Lactose Intolerance (Gi) Other (See Comments)    Bothers her stomach   Penicillins Hives and Rash    Did it involve swelling of the face/tongue/throat, SOB, or low BP? No Did it involve sudden or severe rash/hives, skin peeling, or any reaction on the inside of your mouth or nose? Yes Did you need to seek medical attention at a hospital or doctor's office? No When did it last happen?       25+ years If all above answers are "NO", may proceed with cephalosporin use.  Hives in throat     Sulfa Antibiotics Nausea Only and Rash   Vancomycin Itching   Zebeta [Bisoprolol Fumarate] Nausea Only   Review of Systems  Constitutional:  Negative for chills and fever.  HENT:  Negative for congestion.   Respiratory:  Negative for shortness of breath.   Cardiovascular:  Negative for chest pain  and palpitations.  Gastrointestinal:  Negative for abdominal pain, blood in stool, constipation, diarrhea, nausea and vomiting.  Genitourinary:  Positive for frequency. Negative for dysuria, hematuria and urgency.       (+) burning  Skin:           Neurological:  Negative for headaches.  Psychiatric/Behavioral:  Positive for depression (grief).       Objective:     Physical Exam Constitutional:      General: She is not in acute distress.    Appearance: Normal appearance. She is normal weight. She is not ill-appearing.  HENT:     Head: Normocephalic and atraumatic.     Right Ear: External ear normal.     Left Ear: External ear normal.     Nose: Nose normal.     Mouth/Throat:     Mouth: Mucous membranes are moist.     Pharynx: Oropharynx is clear.  Eyes:     General:        Right eye: No discharge.        Left eye: No discharge.     Extraocular Movements: Extraocular movements intact.     Conjunctiva/sclera: Conjunctivae normal.     Pupils: Pupils are equal, round, and reactive to light.  Cardiovascular:     Rate and Rhythm: Normal rate and regular rhythm.     Pulses: Normal pulses.     Heart sounds: Normal heart sounds. No murmur heard.    No gallop.  Pulmonary:     Effort: Pulmonary effort is normal. No respiratory distress.     Breath sounds: Normal breath sounds. No wheezing or rales.  Abdominal:     General: Bowel sounds are normal.     Palpations: Abdomen is soft.     Tenderness: There is no abdominal tenderness. There is no guarding.  Musculoskeletal:        General: Normal range of motion.     Cervical back: Normal range of motion.     Right lower leg: No edema.     Left lower leg: No edema.  Skin:    General: Skin is warm and dry.  Neurological:     Mental Status: She is alert and oriented to person, place, and time.  Psychiatric:        Mood and Affect: Mood normal.        Behavior: Behavior normal.        Judgment: Judgment normal.    BP 122/78 (BP Location: Right Arm, Patient Position: Sitting, Cuff Size: Normal)   Pulse 75   Temp (!) 97.5 F (36.4 C) (Oral)   Resp 16   Ht '5\' 3"'$  (1.6 m)   Wt 280 lb 6.4 oz (127.2 kg)   SpO2 95%   BMI 49.67 kg/m  Wt Readings from Last 3 Encounters:  09/08/22 280 lb 6.4 oz (127.2 kg)  05/11/22 271 lb 3.2 oz (123 kg)  05/05/22 270 lb 6.4 oz (122.7 kg)   Diabetic Foot Exam -  Simple   No data filed    Lab Results  Component Value Date   WBC 5.0 05/05/2022   HGB 13.6 05/05/2022   HCT 41.2 05/05/2022   PLT 213.0 05/05/2022   GLUCOSE 93 05/05/2022   CHOL 141 05/05/2022   TRIG 121.0 05/05/2022   HDL 43.80 05/05/2022   LDLCALC 73 05/05/2022   ALT 12 05/05/2022   AST 19 05/05/2022   NA 141 05/05/2022   K 4.0 05/05/2022   CL 100 05/05/2022  CREATININE 1.01 05/05/2022   BUN 14 05/05/2022   CO2 32 05/05/2022   TSH 1.99 05/05/2022   INR 1.55 (H) 08/06/2015   HGBA1C 6.2 05/05/2022   Lab Results  Component Value Date   TSH 1.99 05/05/2022   Lab Results  Component Value Date   WBC 5.0 05/05/2022   HGB 13.6 05/05/2022   HCT 41.2 05/05/2022   MCV 94.8 05/05/2022   PLT 213.0 05/05/2022   Lab Results  Component Value Date   NA 141 05/05/2022   K 4.0 05/05/2022   CO2 32 05/05/2022   GLUCOSE 93 05/05/2022   BUN 14 05/05/2022   CREATININE 1.01 05/05/2022   BILITOT 1.1 05/05/2022   ALKPHOS 82 05/05/2022   AST 19 05/05/2022   ALT 12 05/05/2022   PROT 6.6 05/05/2022   ALBUMIN 4.0 05/05/2022   CALCIUM 9.9 05/05/2022   ANIONGAP 6 02/08/2022   GFR 50.56 (L) 05/05/2022   Lab Results  Component Value Date   CHOL 141 05/05/2022   Lab Results  Component Value Date   HDL 43.80 05/05/2022   Lab Results  Component Value Date   LDLCALC 73 05/05/2022   Lab Results  Component Value Date   TRIG 121.0 05/05/2022   Lab Results  Component Value Date   CHOLHDL 3 05/05/2022   Lab Results  Component Value Date   HGBA1C 6.2 05/05/2022      Assessment & Plan:  Immunizations: Reviewed patient's immunization history. She will receive a Tetanus immunization at the pharmacy.  Healthy Lifestyle: Encouraged adequate sleep, exercise, heart healthy diet with protein, and hydration.  UTI: A urine sample will be collected today. Problem List Items Addressed This Visit     Atrial fibrillation ([place])    Tolerating eliquis, rate controlled       Essential hypertension    Well controlled, no changes to meds. Encouraged heart healthy diet such as the DASH diet and exercise as tolerated.       Hyperlipidemia, mixed    Tolerating statin, encouraged heart healthy diet, avoid trans fats, minimize simple carbs and saturated fats. Increase exercise as tolerated      Chronic diastolic HF (heart failure) (HCC)    No recent exacerbation      Hyperglycemia    hgba1c acceptable, minimize simple carbs. Increase exercise as tolerated.       Grief at loss of child    Her 43 year old died unexpectedly right before christmas. She is tearful and is managing with the support of her family she does not feel she needs medication or counseling at this time.       Other Visit Diagnoses     Dysuria    -  Primary   Relevant Orders   Urinalysis, Routine w reflex microscopic   Urine Culture      No orders of the defined types were placed in this encounter.  I, [name], MD, personally preformed the services described in this documentation.  All medical record entries made by the scribe were at my direction and in my presence.  I have reviewed the chart and discharge instructions (if applicable) and agree that the record reflects my personal performance and is accurate and complete. 09/08/2022  I,Mohammed Iqbal,acting as a scribe for [name], MD.,have documented all relevant documentation on the behalf of [name], MD,as directed by  [name], MD while in the presence of [name], MD.  [name], MD

## 2022-09-08 NOTE — Patient Instructions (Signed)
Managing Loss, Adult People experience loss in many different ways throughout their lives. Events such as moving, changing jobs, and losing friends can create a sense of loss. The loss may be as serious as a major health change, divorce, death of a pet, or death of a loved one. All of these types of loss are likely to create a physical and emotional reaction known as grief. Grief is the result of a major change or an absence of something or someone that you count on. Grief is a normal reaction to loss. A variety of factors can affect your grieving experience, including: The nature of your loss. Your relationship to what or whom you lost. Your understanding of grief and how to manage it. Your support system. Be aware that when grief becomes extreme, it can lead to more severe issues like isolation, depression, anxiety, or suicidal thoughts. Talk with your health care provider if you have any of these issues. How to manage lifestyle changes Keep to your normal routine as much as possible. If you have trouble focusing or doing normal activities, it is acceptable to take some time away from your normal routine. Spend time with friends and loved ones. Eat a healthy diet, get plenty of sleep, and rest when you feel tired. How to recognize changes  The way that you deal with your grief will affect your ability to function as you normally do. When grieving, you may experience these changes: Numbness, shock, sadness, anxiety, anger, denial, and guilt. Thoughts about death. Unexpected crying. A physical sensation of emptiness in your stomach. Problems sleeping and eating. Tiredness (fatigue). Loss of interest in normal activities. Dreaming about or imagining seeing the person who died. A need to remember what or whom you lost. Difficulty thinking about anything other than your loss for a period of time. Relief. If you have been expecting the loss for a while, you may feel a sense of relief when it  happens. Follow these instructions at home: Activity Express your feelings in healthy ways, such as: Talking with others about your loss. It may be helpful to find others who have had a similar loss, such as a support group. Writing down your feelings in a journal. Doing physical activities to release stress and emotional energy. Doing creative activities like painting, sculpting, or playing or listening to music. Practicing resilience. This is the ability to recover and adjust after facing challenges. Reading some resources that encourage resilience may help you to learn ways to practice those behaviors.  General instructions Be patient with yourself and others. Allow the grieving process to happen, and remember that grieving takes time. It is likely that you may never feel completely done with some grief. You may find a way to move on while still cherishing memories and feelings about your loss. Accepting your loss is a process. It can take months or longer to adjust. Keep all follow-up visits. This is important. Where to find support To get support for managing loss: Ask your health care provider for help and recommendations, such as grief counseling or therapy. Think about joining a support group for people who are managing a loss. Where to find more information You can find more information about managing loss from: American Society of Clinical Oncology: www.cancer.net American Psychological Association: www.apa.org Contact a health care provider if: Your grief is extreme and keeps getting worse. You have ongoing grief that does not improve. Your body shows symptoms of grief, such as illness. You feel depressed, anxious, or   hopeless. Get help right away if: You have thoughts about hurting yourself or others. Get help right away if you feel like you may hurt yourself or others, or have thoughts about taking your own life. Go to your nearest emergency room or: Call 911. Call the  National Suicide Prevention Lifeline at 1-800-273-8255 or 988. This is open 24 hours a day. Text the Crisis Text Line at 741741. Summary Grief is the result of a major change or an absence of someone or something that you count on. Grief is a normal reaction to loss. The depth of grief and the period of recovery depend on the type of loss and your ability to adjust to the change and process your feelings. Processing grief requires patience and a willingness to accept your feelings and talk about your loss with people who are supportive. It is important to find resources that work for you and to realize that people experience grief differently. There is not one grieving process that works for everyone in the same way. Be aware that when grief becomes extreme, it can lead to more severe issues like isolation, depression, anxiety, or suicidal thoughts. Talk with your health care provider if you have any of these issues. This information is not intended to replace advice given to you by your health care provider. Make sure you discuss any questions you have with your health care provider. Document Revised: 04/07/2021 Document Reviewed: 04/07/2021 Elsevier Patient Education  2023 [employer]  

## 2022-09-09 LAB — URINALYSIS, ROUTINE W REFLEX MICROSCOPIC
Bilirubin Urine: NEGATIVE
Ketones, ur: NEGATIVE
Nitrite: NEGATIVE
Specific Gravity, Urine: 1.01 (ref 1.000–1.030)
Total Protein, Urine: NEGATIVE
Urine Glucose: NEGATIVE
Urobilinogen, UA: 0.2 (ref 0.0–1.0)
pH: 6.5 (ref 5.0–8.0)

## 2022-09-09 LAB — URINE CULTURE
MICRO NUMBER:: 14407829
Result:: NO GROWTH
SPECIMEN QUALITY:: ADEQUATE

## 2022-09-09 NOTE — Progress Notes (Signed)
Remote pacemaker transmission.   

## 2022-09-17 ENCOUNTER — Other Ambulatory Visit (HOSPITAL_BASED_OUTPATIENT_CLINIC_OR_DEPARTMENT_OTHER): Payer: Self-pay

## 2022-09-17 MED ORDER — BOOSTRIX 5-2.5-18.5 LF-MCG/0.5 IM SUSY
PREFILLED_SYRINGE | INTRAMUSCULAR | 0 refills | Status: DC
  Filled 2022-09-17: qty 0.5, 1d supply, fill #0

## 2022-10-02 ENCOUNTER — Other Ambulatory Visit (HOSPITAL_BASED_OUTPATIENT_CLINIC_OR_DEPARTMENT_OTHER): Payer: Self-pay

## 2022-10-05 ENCOUNTER — Other Ambulatory Visit (HOSPITAL_BASED_OUTPATIENT_CLINIC_OR_DEPARTMENT_OTHER): Payer: Self-pay

## 2022-10-08 ENCOUNTER — Encounter (HOSPITAL_COMMUNITY): Payer: Self-pay | Admitting: *Deleted

## 2022-10-09 ENCOUNTER — Other Ambulatory Visit (HOSPITAL_BASED_OUTPATIENT_CLINIC_OR_DEPARTMENT_OTHER): Payer: Self-pay | Admitting: Family Medicine

## 2022-10-09 DIAGNOSIS — Z1239 Encounter for other screening for malignant neoplasm of breast: Secondary | ICD-10-CM

## 2022-10-14 DIAGNOSIS — H40013 Open angle with borderline findings, low risk, bilateral: Secondary | ICD-10-CM | POA: Diagnosis not present

## 2022-10-14 DIAGNOSIS — H5213 Myopia, bilateral: Secondary | ICD-10-CM | POA: Diagnosis not present

## 2022-10-14 DIAGNOSIS — H04123 Dry eye syndrome of bilateral lacrimal glands: Secondary | ICD-10-CM | POA: Diagnosis not present

## 2022-10-14 DIAGNOSIS — H26493 Other secondary cataract, bilateral: Secondary | ICD-10-CM | POA: Diagnosis not present

## 2022-10-14 DIAGNOSIS — H52223 Regular astigmatism, bilateral: Secondary | ICD-10-CM | POA: Diagnosis not present

## 2022-10-14 DIAGNOSIS — H524 Presbyopia: Secondary | ICD-10-CM | POA: Diagnosis not present

## 2022-10-14 DIAGNOSIS — H353131 Nonexudative age-related macular degeneration, bilateral, early dry stage: Secondary | ICD-10-CM | POA: Diagnosis not present

## 2022-11-10 ENCOUNTER — Ambulatory Visit (INDEPENDENT_AMBULATORY_CARE_PROVIDER_SITE_OTHER): Payer: Medicare Other

## 2022-11-10 DIAGNOSIS — I4821 Permanent atrial fibrillation: Secondary | ICD-10-CM | POA: Diagnosis not present

## 2022-11-12 ENCOUNTER — Ambulatory Visit (HOSPITAL_BASED_OUTPATIENT_CLINIC_OR_DEPARTMENT_OTHER)
Admission: RE | Admit: 2022-11-12 | Discharge: 2022-11-12 | Disposition: A | Discharge status: Home / Self Care | Payer: Medicare Other | Source: Ambulatory Visit | Attending: Family Medicine | Admitting: Family Medicine

## 2022-11-12 ENCOUNTER — Encounter (HOSPITAL_BASED_OUTPATIENT_CLINIC_OR_DEPARTMENT_OTHER): Payer: Self-pay

## 2022-11-12 DIAGNOSIS — Z1231 Encounter for screening mammogram for malignant neoplasm of breast: Secondary | ICD-10-CM | POA: Insufficient documentation

## 2022-11-12 DIAGNOSIS — Z1239 Encounter for other screening for malignant neoplasm of breast: Secondary | ICD-10-CM | POA: Diagnosis not present

## 2022-11-12 LAB — CUP PACEART REMOTE DEVICE CHECK
Battery Remaining Longevity: 128 mo
Battery Remaining Percentage: 95.5 %
Battery Voltage: 3.02 V
Brady Statistic RV Percent Paced: 99 %
Date Time Interrogation Session: 20240312022617
Implantable Lead Connection Status: 753985
Implantable Lead Implant Date: 20230609
Implantable Lead Location: 753860
Implantable Pulse Generator Implant Date: 20230609
Lead Channel Impedance Value: 530 Ohm
Lead Channel Pacing Threshold Amplitude: 0.625 V
Lead Channel Pacing Threshold Pulse Width: 0.5 ms
Lead Channel Sensing Intrinsic Amplitude: 12 mV
Lead Channel Setting Pacing Amplitude: 0.875
Lead Channel Setting Pacing Pulse Width: 0.5 ms
Lead Channel Setting Sensing Sensitivity: 4 mV
Pulse Gen Model: 1272
Pulse Gen Serial Number: 8068284

## 2022-11-23 ENCOUNTER — Ambulatory Visit: Payer: Medicare Other | Admitting: Adult Health

## 2022-11-24 ENCOUNTER — Encounter: Payer: Self-pay | Admitting: Adult Health

## 2022-11-24 ENCOUNTER — Ambulatory Visit (INDEPENDENT_AMBULATORY_CARE_PROVIDER_SITE_OTHER): Payer: Medicare Other | Admitting: Adult Health

## 2022-11-24 VITALS — BP 120/80 | HR 90 | Temp 97.8°F | Ht 63.0 in | Wt 283.2 lb

## 2022-11-24 DIAGNOSIS — G4730 Sleep apnea, unspecified: Secondary | ICD-10-CM | POA: Diagnosis not present

## 2022-11-24 DIAGNOSIS — J8410 Pulmonary fibrosis, unspecified: Secondary | ICD-10-CM

## 2022-11-24 DIAGNOSIS — J301 Allergic rhinitis due to pollen: Secondary | ICD-10-CM | POA: Diagnosis not present

## 2022-11-24 DIAGNOSIS — I5032 Chronic diastolic (congestive) heart failure: Secondary | ICD-10-CM | POA: Diagnosis not present

## 2022-11-24 NOTE — Progress Notes (Unsigned)
@Patient  ID: Tracy Miles, female    DOB: December 02, 1933, 89 y.o.   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 Lives at [facility] in a private home.  TEST/EVENTS :  PSG 03/07/15 >had trouble sleeping due to back pain (total sleep time was only 243 min ) AHI 10.4./hr  (AHI during REM 74.7 /hr )  Low sat 84%. Severe PLMS.    August 2020 pan ANCA panel negative, ANA negative, rheumatoid factor negative   High-resolution CT chest March 31, 2019 mild basilar predominant subpleural fibrosis can be seen with amiodarone use-nonspecific interstitial pneumonitis or early mild usual interstitial pneumonitis due to other etiologies.     ILD Workup  From [place] , lived in [place] for 47 yrs  Education officer, museum, homemaker.  Second hand smoke as child  Frequent bronchitis as child and adult. (frequently long time to get over) . No official dx of Asthma .  Mild rhinitis  Previous Cat . (no bird/chickens)  No hot tub.  No recent travel. (previous international travel -nothing in last 10 yrs. ) No known autoimmune disease  No long term macrodantin. No chemo use. No methotrexate use.  On Amiodarone in past for ~ 6 months , taken off 2014 due to eye issues.  No unusual hobbies . Occasional refinish furniture  Lives alone, does light house work .  Goes to indoor pool , walks in pool .  No dysphagia or choking or GERD .    ANA Tracy Miles neg .  11/24/2022 Follow up: OSA , ILD m AR  Patient presents for 1 year follow-up.  Patient has underlying obstructive sleep apnea is on nocturnal BiPAP.  Patient says she is doing well on BiPAP cannot sleep without it.  She uses it  every single night. BiPAP download shows excellent compliance with 100% usage.  Daily average usage at 8 hours.  Patient is on auto BiPAP with IPAP at 16 cm H2O.  And EPAP minimum 12 cm H2O.  AHI 1.3/hour. Patient says it has been a stressful year.  Her son died unexpectedly from a heart attack.  Support was provided.  Patient says she is trying to stay active.  She lives at [facility] in [place].  She has a private home.  She walks in the pool daycare 3 days a week. Patient says her allergies have been doing okay.  She takes Claritin if she needs it. Previous CT scan does show some very mild ILD changes.  Felt to be secondary to previous amiodarone use.  She has no significant symptoms no cough or significant shortness of breath.  We have discussed deferring further testing unless she develops symptoms.  Chest x-ray in June 2023 showed no acute changes. Patient continues to follow with cardiology for A-fib and diastolic heart failure.  She underwent pacemaker implantation in June 2023 and AV nodal ablation July 2023 . Patient says this has really helped her quite a bit she feels that her energy level and activity tolerance improved.    Allergies  Allergen Reactions   Gabapentin Swelling    Eyes and Face   Lactose Intolerance (Gi) Other (See Comments)    Bothers her stomach  Penicillins Hives and Rash    Did it involve swelling of the face/tongue/throat, SOB, or low BP? No Did it involve sudden or severe rash/hives, skin peeling, or any reaction on the inside of your mouth or nose? Yes Did you need to seek medical attention at a hospital or doctor's office? No When did it last happen?       25+ years If all above answers are "NO", may proceed with cephalosporin use.  Hives in throat     Sulfa Antibiotics Nausea Only and Rash   Vancomycin Itching   Zebeta [Bisoprolol Fumarate] Nausea Only    Immunization History  Administered Date(s) Administered   COVID-19, mRNA,  vaccine(Comirnaty)12 years and older 07/14/2022   Fluad Quad(high Dose 65+) 06/16/2019, 05/19/2021, 05/22/2022   Influenza, High Dose Seasonal PF 05/11/2017, 05/24/2018   Influenza,inj,Quad PF,6+ Mos 04/30/2016   Influenza-Unspecified 05/31/2013, 05/01/2014, 06/11/2015   Moderna Covid-19 Vaccine Bivalent Booster 56yrs & up 05/26/2021, 01/20/2022   Moderna Sars-Covid-2 Vaccination 09/13/2019, 10/10/2019, 07/03/2020, 12/18/2020   Pfizer Covid-19 Vaccine Bivalent Booster 44yrs & up 06/12/2021   Pneumococcal Conjugate-13 04/02/2014   Pneumococcal Polysaccharide-23 08/31/1998, 09/06/2015   Respiratory Syncytial Virus Vaccine,Recomb Aduvanted(Arexvy) 05/18/2022   Tdap 07/31/2012, 09/17/2022   Zoster Recombinat (Shingrix) 04/19/2018, 06/27/2018    Past Medical History:  Diagnosis Date   Anemia    Aortic stenosis    Very mild   Benign paroxysmal positional vertigo 12/17/2013   Chicken pox as a child   Colon polyp    Coronary artery disease    DDD (degenerative disc disease) 11/16/2013   Hyperlipidemia    Hypertension    Iron deficiency anemia 11/13/2013   Mumps child and teenager   OA (osteoarthritis) 11/19/2013   S/p L TKR   Obesity    OSA on CPAP    Pancreatitis    post hysterectomy   Persistent atrial fibrillation (HCC)    Personal history of colonic polyps 02/25/2014   Personal history of DVT (deep vein thrombosis) X 2   "left"   Sleep apnea    Spinal stenosis     Tobacco History: Social History   Tobacco Use  Smoking Status Never  Smokeless Tobacco Never  Tobacco Comments   never used tobacco   Counseling given: Not Answered Tobacco comments: never used tobacco   Outpatient Medications Prior to Visit  Medication Sig Dispense Refill   acetaminophen (TYLENOL) 325 MG tablet Take 325 mg by mouth every morning.     atorvastatin (LIPITOR) 20 MG tablet Take 1 tablet (20 mg total) by mouth daily. 90 tablet 3   bumetanide (BUMEX) 1 MG tablet Take 1 tablet (1 mg total) by  mouth daily. 90 tablet 1   Calcium Carb-Cholecalciferol (CALCIUM 600 + D PO) Take 1 tablet by mouth 2 (two) times daily.     Carboxymethylcellul-Glycerin (LUBRICATING EYE DROPS OP) Place 1 drop into both eyes in the morning and at bedtime.     clindamycin (CLEOCIN) 300 MG capsule Take 1,200 mg by mouth See admin instructions. Before dental work     ELIQUIS 5 MG TABS tablet TAKE ONE TABLET EVERY MORNING AND AT BEDTIME 180 tablet 1   influenza vaccine adjuvanted (FLUAD QUADRIVALENT) 0.5 ML injection Inject into the muscle. 0.5 mL 0   lisinopril (ZESTRIL) 10 MG tablet Take 1 tablet (10 mg total) by mouth daily. 90 tablet 1   loratadine (CLARITIN) 10 MG tablet Take 10 mg by mouth daily.      Multiple Vitamins-Minerals (ONE-A-DAY WOMENS 50 PLUS  PO) Take 1 tablet by mouth daily.     potassium chloride (KLOR-CON) 10 MEQ tablet TAKE 1 TABLET BY MOUTH IN THE  MORNING AND 2 TABLETS BY MOUTH  IN THE EVENING 270 tablet 3   Probiotic CAPS Take 1 capsule by mouth daily. Gummy     psyllium (REGULOID) 0.52 g capsule Take 0.52 g by mouth 2 (two) times daily.      Tdap (BOOSTRIX) 5-2.5-18.5 LF-MCG/0.5 injection Inject into the muscle. 0.5 mL 0   No facility-administered medications prior to visit.     Review of Systems:   Constitutional:   No  weight loss, night sweats,  Fevers, chills, fatigue, or  lassitude.  HEENT:   No headaches,  Difficulty swallowing,  Tooth/dental problems, or  Sore throat,                No sneezing, itching, ear ache, nasal congestion, post nasal drip,   CV:  No chest pain,  Orthopnea, PND,  anasarca, dizziness, palpitations, syncope.   GI  No heartburn, indigestion, abdominal pain, nausea, vomiting, diarrhea, change in bowel habits, loss of appetite, bloody stools.   Resp: No shortness of breath with exertion or at rest.  No excess mucus, no productive cough,  No non-productive cough,  No coughing up of blood.  No change in color of mucus.  No wheezing.  No chest wall  deformity  Skin: no rash or lesions.  GU: no dysuria, change in color of urine, no urgency or frequency.  No flank pain, no hematuria   MS:  No joint pain or swelling.  No decreased range of motion.  No back pain.    Physical Exam  BP 120/80 (BP Location: Left Arm, Patient Position: Sitting, Cuff Size: Large)   Pulse 90   Temp 97.8 F (36.6 C) (Oral)   Ht 5\' 3"  (1.6 m)   Wt 283 lb 3.2 oz (128.5 kg)   SpO2 94%   BMI 50.17 kg/m   GEN: A/Ox3; pleasant , NAD, well nourished    HEENT:  [place]/AT,   NOSE-clear, THROAT-clear, no lesions, no postnasal drip or exudate noted.   NECK:  Supple w/ fair ROM; no JVD; normal carotid impulses w/o bruits; no thyromegaly or nodules palpated; no lymphadenopathy.    RESP  Clear  P & A; w/o, wheezes/ rales/ or rhonchi. no accessory muscle use, no dullness to percussion  CARD:  RRR, no m/r/g, tr peripheral edema, pulses intact, no cyanosis or clubbing.  GI:   Soft & nt; nml bowel sounds; no organomegaly or masses detected.   Musco: Warm bil, no deformities or joint swelling noted.   Neuro: alert, no focal deficits noted.    Skin: Warm, no lesions or rashes    Lab Results:  CBC   BMET    ProBNP       Latest Ref Rng & Units 03/31/2019   10:01 AM 11/15/2013   11:35 AM  PFT Results  FVC-Pre L 1.94  P 2.24   FVC-Predicted Pre % 80  P 86   FVC-Post L 2.08  P 2.34   FVC-Predicted Post % 86  P 90   Pre FEV1/FVC % % 74  P 79   Post FEV1/FCV % % 76  P 83   FEV1-Pre L 1.42  P 1.78   FEV1-Predicted Pre % 80  P 92   FEV1-Post L 1.59  P 1.95   DLCO uncorrected ml/min/mmHg 19.74  P 14.34   DLCO UNC% % 109  P 62   DLCO corrected ml/min/mmHg 19.74  P   DLCO COR %Predicted % 109  P   DLVA Predicted % 125  P 78   TLC L 4.36  P 4.38   TLC % Predicted % 88  P 89   RV % Predicted % 94  P 87     P Preliminary result    No results found for: "NITRICOXIDE"      Assessment & Plan:   No problem-specific Assessment & Plan notes found  for this encounter.     Rexene Edison, NP 11/24/2022

## 2022-11-24 NOTE — Patient Instructions (Addendum)
Great job with BIPAP  Keep up good work .  Work on healthy weight loss Do not drive if sleepy.  Activity as tolerated.  Claritin 10mg  daily As needed   Flonase nasal 1 puff daily as needed  Saline nasal gel At bedtime  As needed   Follow up with Dr. Halford Chessman or Elvi Leventhal NP in 1 year and As needed

## 2022-11-26 DIAGNOSIS — J309 Allergic rhinitis, unspecified: Secondary | ICD-10-CM | POA: Insufficient documentation

## 2022-11-26 NOTE — Assessment & Plan Note (Signed)
Excellent control compliance on nocturnal BiPAP.  No changes.  Plan  Patient Instructions  Tracy Miles job with BIPAP  Keep up good work .  Work on healthy weight loss Do not drive if sleepy.  Activity as tolerated.  Claritin 10mg  daily As needed   Flonase nasal 1 puff daily as needed  Saline nasal gel At bedtime  As needed   Follow up with Dr. Halford Chessman or Joshoa Shawler NP in 1 year and As needed

## 2022-11-26 NOTE — Assessment & Plan Note (Signed)
Very mild changes noted on CT scan.  Felt to be secondary to previous amiodarone use.  Has no significant clinical symptoms.  Hold on additional testing at this time.  If becomes symptomatic can move forward with ongoing workup

## 2022-11-26 NOTE — Assessment & Plan Note (Signed)
Continue on current regimen.  Trigger prevention  Plan  Patient Instructions  Tracy Miles job with BIPAP  Keep up good work .  Work on healthy weight loss Do not drive if sleepy.  Activity as tolerated.  Claritin 10mg  daily As needed   Flonase nasal 1 puff daily as needed  Saline nasal gel At bedtime  As needed   Follow up with Dr. Halford Chessman or Cloey Sferrazza NP in 1 year and As needed

## 2022-11-26 NOTE — Assessment & Plan Note (Signed)
Appears compensated continue to follow-up with cardiology

## 2022-11-30 NOTE — Progress Notes (Signed)
Reviewed and agree with assessment/plan.   Chesley Mires, MD [facility] Pulmonary/Critical Care 11/30/2022, 8:00 AM Pager:  [phone]

## 2022-11-30 NOTE — Assessment & Plan Note (Signed)
Tolerating statin, encouraged heart healthy diet, avoid trans fats, minimize simple carbs and saturated fats. Increase exercise as tolerated 

## 2022-11-30 NOTE — Assessment & Plan Note (Signed)
Encouraged DASH or MIND diet, decrease po intake and increase exercise as tolerated. Needs 7-8 hours of sleep nightly. Avoid trans fats, eat small, frequent meals every 4-5 hours with lean proteins, complex carbs and healthy fats. Minimize simple carbs, high fat foods and processed foods. Consider [phone]

## 2022-11-30 NOTE — Assessment & Plan Note (Signed)
Well controlled, no changes to meds. Encouraged heart healthy diet such as the DASH diet and exercise as tolerated.  °

## 2022-11-30 NOTE — Assessment & Plan Note (Signed)
hgba1c acceptable, minimize simple carbs. Increase exercise as tolerated.  

## 2022-11-30 NOTE — Assessment & Plan Note (Signed)
Tolerating eliquis, rate controlled 

## 2022-12-01 ENCOUNTER — Ambulatory Visit (INDEPENDENT_AMBULATORY_CARE_PROVIDER_SITE_OTHER): Payer: Medicare Other | Admitting: Family Medicine

## 2022-12-01 VITALS — BP 130/78 | HR 81 | Temp 98.0°F | Resp 16 | Ht 63.0 in | Wt 287.4 lb

## 2022-12-01 DIAGNOSIS — R739 Hyperglycemia, unspecified: Secondary | ICD-10-CM

## 2022-12-01 DIAGNOSIS — E782 Mixed hyperlipidemia: Secondary | ICD-10-CM

## 2022-12-01 DIAGNOSIS — E6609 Other obesity due to excess calories: Secondary | ICD-10-CM | POA: Diagnosis not present

## 2022-12-01 DIAGNOSIS — I4821 Permanent atrial fibrillation: Secondary | ICD-10-CM

## 2022-12-01 DIAGNOSIS — F4321 Adjustment disorder with depressed mood: Secondary | ICD-10-CM | POA: Diagnosis not present

## 2022-12-01 DIAGNOSIS — Z634 Disappearance and death of family member: Secondary | ICD-10-CM | POA: Diagnosis not present

## 2022-12-01 DIAGNOSIS — I10 Essential (primary) hypertension: Secondary | ICD-10-CM | POA: Diagnosis not present

## 2022-12-01 DIAGNOSIS — M48061 Spinal stenosis, lumbar region without neurogenic claudication: Secondary | ICD-10-CM

## 2022-12-01 DIAGNOSIS — G4730 Sleep apnea, unspecified: Secondary | ICD-10-CM

## 2022-12-01 LAB — CBC WITH DIFFERENTIAL/PLATELET
Basophils Absolute: 0.1 10*3/uL (ref 0.0–0.1)
Basophils Relative: 0.8 % (ref 0.0–3.0)
Eosinophils Absolute: 0.1 10*3/uL (ref 0.0–0.7)
Eosinophils Relative: 2.1 % (ref 0.0–5.0)
HCT: 41.4 % (ref 36.0–46.0)
Hemoglobin: 13.7 g/dL (ref 12.0–15.0)
Lymphocytes Relative: 14.6 % (ref 12.0–46.0)
Lymphs Abs: 1 10*3/uL (ref 0.7–4.0)
MCHC: 33.1 g/dL (ref 30.0–36.0)
MCV: 96 fl (ref 78.0–100.0)
Monocytes Absolute: 0.5 10*3/uL (ref 0.1–1.0)
Monocytes Relative: 8.1 % (ref 3.0–12.0)
Neutro Abs: 5 10*3/uL (ref 1.4–7.7)
Neutrophils Relative %: 74.4 % (ref 43.0–77.0)
Platelets: 224 10*3/uL (ref 150.0–400.0)
RBC: 4.32 Mil/uL (ref 3.87–5.11)
RDW: 13 % (ref 11.5–15.5)
WBC: 6.7 10*3/uL (ref 4.0–10.5)

## 2022-12-01 LAB — COMPREHENSIVE METABOLIC PANEL
ALT: 11 U/L (ref 0–35)
AST: 16 U/L (ref 0–37)
Albumin: 4.2 g/dL (ref 3.5–5.2)
Alkaline Phosphatase: 85 U/L (ref 39–117)
BUN: 22 mg/dL (ref 6–23)
CO2: 29 mEq/L (ref 19–32)
Calcium: 9.8 mg/dL (ref 8.4–10.5)
Chloride: 102 mEq/L (ref 96–112)
Creatinine, Ser: 1.09 mg/dL (ref 0.40–1.20)
GFR: 45.96 mL/min — ABNORMAL LOW (ref >60.00)
Glucose, Bld: 95 mg/dL (ref 70–99)
Potassium: 4.7 mEq/L (ref 3.5–5.1)
Sodium: 138 mEq/L (ref 135–145)
Total Bilirubin: 1 mg/dL (ref 0.2–1.2)
Total Protein: 6.6 g/dL (ref 6.0–8.3)

## 2022-12-01 LAB — LIPID PANEL
Cholesterol: 147 mg/dL (ref 0–200)
HDL: 44.4 mg/dL (ref >39.00)
LDL Cholesterol: 74 mg/dL (ref 0–99)
NonHDL: 102.84
Total CHOL/HDL Ratio: 3
Triglycerides: 143 mg/dL (ref 0.0–149.0)
VLDL: 28.6 mg/dL (ref 0.0–40.0)

## 2022-12-01 LAB — HEMOGLOBIN A1C: Hgb A1c MFr Bld: 6.1 % (ref 4.6–6.5)

## 2022-12-01 LAB — TSH: TSH: 2.36 u[IU]/mL (ref 0.35–5.50)

## 2022-12-01 NOTE — Assessment & Plan Note (Signed)
Her back and knee pain limits her ability to exercise but she continues to get in the pool a couple times a week and is encouraged to try light weights and some pedals at home. Consider PT at patient discretion

## 2022-12-01 NOTE — Progress Notes (Signed)
Subjective:   By signing my name below, I, Tracy Miles, attest that this documentation has been prepared under the direction and in the presence of Tracy Lukes, MD., 12/01/2022.   Patient ID: Tracy Miles, female    DOB: 1933-10-16, 89 y.o.   MRN: [id number]  Chief Complaint  Patient presents with   Follow-up    Follow up   HPI Patient is in today for an office visit.  Allergic Rhinitis Patient continues to experience allergic rhinitis and states that she is taking Claritin as needed. However, her pulmonologist has suggested that she stop this and use Fluticasone nasal spray instead.  Depression/Grief Patient is grieving the loss of her son who died unexpectedly from a heart attack. She feels depressed but has been swimming 3 times weekly with her close friend who has been supportive. Additionally, she states that her depression has caused her to gain weight. Wt Readings from Last 3 Encounters:  12/01/22 287 lb 6.4 oz (130.4 kg)  11/24/22 283 lb 3.2 oz (128.5 kg)  09/08/22 280 lb 6.4 oz (127.2 kg)   Sleep Apnea Patient reports that she was recently seen by Rexene Edison, pulmonology disease NP, on 11/24/2022 to follow up on her sleep apnea. She states that this is well-controlled and she continues using her BiPap machine when sleeping.  Spinal Stenosis Patient complains of chronic lower back and bilateral knee pain which she has been managing with Extra Strength Tylenol. She states that the quality of the pain has been steady and she is choosing not to continue with knee replacement surgery due to her age. She denies CP/palpitations/SOB/ HA/fever/ chills/GI or GU symptoms.  Past Medical History:  Diagnosis Date   Anemia    Aortic stenosis    Very mild   Benign paroxysmal positional vertigo 12/17/2013   Chicken pox as a child   Colon polyp    Coronary artery disease    DDD (degenerative disc disease) 11/16/2013   Hyperlipidemia    Hypertension    Iron deficiency  anemia 11/13/2013   Mumps child and teenager   OA (osteoarthritis) 11/19/2013   S/p L TKR   Obesity    OSA on CPAP    Pancreatitis    post hysterectomy   Persistent atrial fibrillation (HCC)    Personal history of colonic polyps 02/25/2014   Personal history of DVT (deep vein thrombosis) X 2   "left"   Sleep apnea    Spinal stenosis     Past Surgical History:  Procedure Laterality Date   APPENDECTOMY     ATRIAL FIBRILLATION ABLATION N/A 04/25/2019   Procedure: ATRIAL FIBRILLATION ABLATION;  Surgeon: [name], MD;  Location: [place] [facility];  Service: Cardiovascular;  Laterality: N/A;   AV NODE ABLATION N/A 03/23/2022   Procedure: AV NODE ABLATION;  Surgeon: Vickie Epley, MD;  Location: [place] [facility];  Service: Cardiovascular;  Laterality: N/A;   CARDIOVERSION N/A 08/09/2015   Procedure: CARDIOVERSION;  Surgeon: Jerline Pain, MD;  Location[facility] ENDOSCOPY;  Service: Cardiovascular;  Laterality: N/A;   CARDIOVERSION N/A 09/30/2018   Procedure: CARDIOVERSION;  Surgeon: [name], MD;  Location: [facility] ENDOSCOPY;  Service: Cardiovascular;  Laterality: N/A;   CARDIOVERSION N/A 02/27/2019   Procedure: CARDIOVERSION;  Surgeon: Buford Dresser, MD;  Location: [facility] ENDOSCOPY;  Service: Cardiovascular;  Laterality: N/A;   CARDIOVERSION N/A 06/07/2019   Procedure: CARDIOVERSION;  Surgeon: [name], MD;  Location: [place];  Service: Cardiovascular;  Laterality: N/A;  CATARACT EXTRACTION W/ INTRAOCULAR LENS  IMPLANT, BILATERAL Bilateral 2016   CYST REMOVAL HAND Bilateral 1990's   "played to much golf"   DILATION AND CURETTAGE OF UTERUS     implantable loop recorder placement  11/20/2019   Medtronic Reveal Linq model LNQ 22 (model number [id number]) implanted in office by Dr Rayann Heman for afib management   LUMBAR LAMINECTOMY  40 yrs ago   "L3-4"   MENISCUS REPAIR Bilateral 2000 and 2010   PACEMAKER IMPLANT N/A 02/06/2022   Procedure: PACEMAKER IMPLANT;   Surgeon: Vickie Epley, MD;  Location: [place] [facility];  Service: Cardiovascular;  Laterality: N/A;   PACEMAKER INSERTION     REPLACEMENT TOTAL KNEE Left 2013   TEE WITHOUT CARDIOVERSION N/A 04/24/2019   Procedure: TRANSESOPHAGEAL ECHOCARDIOGRAM (TEE);  Surgeon: Acie Fredrickson Wonda Cheng, MD;  Location: [facility] ENDOSCOPY;  Service: Cardiovascular;  Laterality: N/A;   TONSILECTOMY, ADENOIDECTOMY, BILATERAL MYRINGOTOMY AND TUBES  child   TOTAL ABDOMINAL HYSTERECTOMY  1995   had 2 tumors- benign   TUBAL LIGATION  89 years old    Family History  Problem Relation Age of Onset   Heart attack Mother 12   Hyperlipidemia Mother        ?   Dementia Mother    Pernicious anemia Mother    COPD Father        smoker   Cancer Father 78       prostate   Heart disease Brother        quadruple bipass surgery   Hyperlipidemia Brother    Hypertension Brother    Diabetes Maternal Grandmother        type 2   Pernicious anemia Maternal Grandmother    Gout Son    Atrial fibrillation Son    Hyperlipidemia Son    Cancer Maternal Grandfather        liver   Cancer Paternal Grandmother        lung- doesn't think she smokes?   Stroke Paternal Grandfather    Cancer Son 59       non hodgin's lymphoma   Gout Son    Hyperlipidemia Son    Sleep apnea Son    Colon cancer Neg Hx    Pancreatic cancer Neg Hx    Stomach cancer Neg Hx    Throat cancer Neg Hx    Liver disease Neg Hx     Social History   Socioeconomic History   Marital status: Widowed    Spouse name: Not on file   Number of children: 3   Years of education: Not on file   Highest education level: Not on file  Occupational History   Occupation: retired Pharmacist, hospital  Tobacco Use   Smoking status: Never   Smokeless tobacco: Never   Tobacco comments:    never used tobacco  Substance and Sexual Activity   Alcohol use: No   Drug use: No   Sexual activity: Never    Comment: lives [place], low sodium diet  Other Topics Concern   Not  on file  Social History Narrative   Not on file   Social Determinants of Health   Financial Resource Strain: Low Risk  (06/19/2021)   Overall Financial Resource Strain (CARDIA)    Difficulty of Paying Living Expenses: Not hard at all  Food Insecurity: No Food Insecurity (06/19/2021)   Hunger Vital Sign    Worried About Running Out of Food in the Last Year: Never true    Ran Out  of Food in the Last Year: Never true  Transportation Needs: No Transportation Needs (06/19/2021)   PRAPARE - Hydrologist (Medical): No    Lack of Transportation (Non-Medical): No  Physical Activity: Sufficiently Active (06/19/2021)   Exercise Vital Sign    Days of Exercise per Week: 3 days    Minutes of Exercise per Session: 60 min  Stress: No Stress Concern Present (06/19/2021)   [place]    Feeling of Stress : Not at all  Social Connections: Moderately Integrated (06/19/2021)   Social Connection and Isolation Panel [NHANES]    Frequency of Communication with Friends and Family: More than three times a week    Frequency of Social Gatherings with Friends and Family: More than three times a week    Attends Religious Services: More than 4 times per year    Active Member of [employer] or Organizations: Yes    Attends Archivist Meetings: 1 to 4 times per year    Marital Status: Widowed  Intimate Partner Violence: Not At Risk (06/19/2021)   Humiliation, Afraid, Rape, and Kick questionnaire    Fear of Current or Ex-Partner: No    Emotionally Abused: No    Physically Abused: No    Sexually Abused: No    Outpatient Medications Prior to Visit  Medication Sig Dispense Refill   acetaminophen (TYLENOL) 325 MG tablet Take 325 mg by mouth every morning.     atorvastatin (LIPITOR) 20 MG tablet Take 1 tablet (20 mg total) by mouth daily. 90 tablet 3   bumetanide (BUMEX) 1 MG tablet Take 1 tablet (1 mg total) by  mouth daily. 90 tablet 1   Calcium Carb-Cholecalciferol (CALCIUM 600 + D PO) Take 1 tablet by mouth 2 (two) times daily.     Carboxymethylcellul-Glycerin (LUBRICATING EYE DROPS OP) Place 1 drop into both eyes in the morning and at bedtime.     clindamycin (CLEOCIN) 300 MG capsule Take 1,200 mg by mouth See admin instructions. Before dental work     ELIQUIS 5 MG TABS tablet TAKE ONE TABLET EVERY MORNING AND AT BEDTIME 180 tablet 1   influenza vaccine adjuvanted (FLUAD QUADRIVALENT) 0.5 ML injection Inject into the muscle. 0.5 mL 0   lisinopril (ZESTRIL) 10 MG tablet Take 1 tablet (10 mg total) by mouth daily. 90 tablet 1   loratadine (CLARITIN) 10 MG tablet Take 10 mg by mouth daily.      Multiple Vitamins-Minerals (ONE-A-DAY WOMENS 50 PLUS PO) Take 1 tablet by mouth daily.     potassium chloride (KLOR-CON) 10 MEQ tablet TAKE 1 TABLET BY MOUTH IN THE  MORNING AND 2 TABLETS BY MOUTH  IN THE EVENING 270 tablet 3   Probiotic CAPS Take 1 capsule by mouth daily. Gummy     psyllium (REGULOID) 0.52 g capsule Take 0.52 g by mouth 2 (two) times daily.      Tdap (BOOSTRIX) 5-2.5-18.5 LF-MCG/0.5 injection Inject into the muscle. 0.5 mL 0   No facility-administered medications prior to visit.    Allergies  Allergen Reactions   Gabapentin Swelling    Eyes and Face   Lactose Intolerance (Gi) Other (See Comments)    Bothers her stomach   Penicillins Hives and Rash    Did it involve swelling of the face/tongue/throat, SOB, or low BP? No Did it involve sudden or severe rash/hives, skin peeling, or any reaction on the inside of your mouth or nose? Yes Did  you need to seek medical attention at a hospital or doctor's office? No When did it last happen?       25+ years If all above answers are "NO", may proceed with cephalosporin use.  Hives in throat     Sulfa Antibiotics Nausea Only and Rash   Vancomycin Itching   Zebeta [Bisoprolol Fumarate] Nausea Only    Review of Systems  Constitutional:   Negative for chills and fever.  Respiratory:  Negative for shortness of breath.   Cardiovascular:  Negative for chest pain and palpitations.  Gastrointestinal:  Negative for abdominal pain, blood in stool, constipation, diarrhea, nausea and vomiting.  Genitourinary:  Negative for dysuria, frequency, hematuria and urgency.  Skin:           Neurological:  Negative for headaches.       Objective:    Physical Exam Constitutional:      General: She is not in acute distress.    Appearance: Normal appearance. She is obese. She is not ill-appearing.  HENT:     Head: Normocephalic and atraumatic.     Right Ear: External ear normal.     Left Ear: External ear normal.     Nose: Nose normal.     Mouth/Throat:     Mouth: Mucous membranes are moist.     Pharynx: Oropharynx is clear.  Eyes:     General:        Right eye: No discharge.        Left eye: No discharge.     Extraocular Movements: Extraocular movements intact.     Conjunctiva/sclera: Conjunctivae normal.     Pupils: Pupils are equal, round, and reactive to light.  Cardiovascular:     Rate and Rhythm: Normal rate and regular rhythm.     Pulses: Normal pulses.     Heart sounds: Normal heart sounds. No murmur heard.    No gallop.  Pulmonary:     Effort: Pulmonary effort is normal. No respiratory distress.     Breath sounds: Normal breath sounds. No wheezing or rales.  Abdominal:     General: Bowel sounds are normal.     Palpations: Abdomen is soft.     Tenderness: There is no abdominal tenderness. There is no guarding.  Musculoskeletal:        General: Normal range of motion.     Cervical back: Normal range of motion.     Right lower leg: No edema.     Left lower leg: No edema.  Skin:    General: Skin is warm and dry.  Neurological:     Mental Status: She is alert and oriented to person, place, and time.  Psychiatric:        Mood and Affect: Mood normal.        Behavior: Behavior normal.        Judgment: Judgment  normal.     BP 130/78 (BP Location: Right Arm, Patient Position: Sitting, Cuff Size: Large)   Pulse 81   Temp 98 F (36.7 C) (Oral)   Resp 16   Ht 5\' 3"  (1.6 m)   Wt 287 lb 6.4 oz (130.4 kg)   SpO2 95%   BMI 50.91 kg/m  Wt Readings from Last 3 Encounters:  12/01/22 287 lb 6.4 oz (130.4 kg)  11/24/22 283 lb 3.2 oz (128.5 kg)  09/08/22 280 lb 6.4 oz (127.2 kg)    Diabetic Foot Exam - Simple   No data filed    Lab Results  Component Value Date   WBC 5.0 05/05/2022   HGB 13.6 05/05/2022   HCT 41.2 05/05/2022   PLT 213.0 05/05/2022   GLUCOSE 93 05/05/2022   CHOL 141 05/05/2022   TRIG 121.0 05/05/2022   HDL 43.80 05/05/2022   LDLCALC 73 05/05/2022   ALT 12 05/05/2022   AST 19 05/05/2022   NA 141 05/05/2022   K 4.0 05/05/2022   CL 100 05/05/2022   CREATININE 1.01 05/05/2022   BUN 14 05/05/2022   CO2 32 05/05/2022   TSH 1.99 05/05/2022   INR 1.55 (H) 08/06/2015   HGBA1C 6.2 05/05/2022    Lab Results  Component Value Date   TSH 1.99 05/05/2022   Lab Results  Component Value Date   WBC 5.0 05/05/2022   HGB 13.6 05/05/2022   HCT 41.2 05/05/2022   MCV 94.8 05/05/2022   PLT 213.0 05/05/2022   Lab Results  Component Value Date   NA 141 05/05/2022   K 4.0 05/05/2022   CO2 32 05/05/2022   GLUCOSE 93 05/05/2022   BUN 14 05/05/2022   CREATININE 1.01 05/05/2022   BILITOT 1.1 05/05/2022   ALKPHOS 82 05/05/2022   AST 19 05/05/2022   ALT 12 05/05/2022   PROT 6.6 05/05/2022   ALBUMIN 4.0 05/05/2022   CALCIUM 9.9 05/05/2022   ANIONGAP 6 02/08/2022   GFR 50.56 (L) 05/05/2022   Lab Results  Component Value Date   CHOL 141 05/05/2022   Lab Results  Component Value Date   HDL 43.80 05/05/2022   Lab Results  Component Value Date   LDLCALC 73 05/05/2022   Lab Results  Component Value Date   TRIG 121.0 05/05/2022   Lab Results  Component Value Date   CHOLHDL 3 05/05/2022   Lab Results  Component Value Date   HGBA1C 6.2 05/05/2022      Assessment  & Plan:  Labs: Routine blood work ordered.  Obesity: Body mass index is 50.91 kg/m. Patient is now walking in the swimming pool 3 times weekly.  Problem List Items Addressed This Visit     Atrial fibrillation - Primary    Tolerating eliquis, rate controlled      Essential hypertension    Well controlled, no changes to meds. Encouraged heart healthy diet such as the DASH diet and exercise as tolerated.       Relevant Orders   CBC with Differential/Platelet   Comprehensive metabolic panel   TSH   Grief at loss of child    Is still struggling with the loss of her son but she is managing with the support of friends.      Hyperglycemia    hgba1c acceptable, minimize simple carbs. Increase exercise as tolerated.       Relevant Orders   Hemoglobin A1c   CBC with Differential/Platelet   Comprehensive metabolic panel   TSH   Hyperlipidemia, mixed    Tolerating statin, encouraged heart healthy diet, avoid trans fats, minimize simple carbs and saturated fats. Increase exercise as tolerated      Relevant Orders   Lipid panel   Obesity    Encouraged DASH or MIND diet, decrease po intake and increase exercise as tolerated. Needs 7-8 hours of sleep nightly. Avoid trans fats, eat small, frequent meals every 4-5 hours with lean proteins, complex carbs and healthy fats. Minimize simple carbs, high fat foods and processed foods. Consider Wegovy      Sleep apnea in adult    FOLLOWING WITH PULMONOLOGY AND is doing well  just had her machine interrogated and it is managing her well      Spinal stenosis of lumbar region    Her back and knee pain limits her ability to exercise but she continues to get in the pool a couple times a week and is encouraged to try light weights and some pedals at home. Consider PT at patient discretion      No orders of the defined types were placed in this encounter.  I, [name], MD, personally preformed the services described in this documentation.   All medical record entries made by the scribe were at my direction and in my presence.  I have reviewed the chart and discharge instructions (if applicable) and agree that the record reflects my personal performance and is accurate and complete. 12/01/2022  I,Mohammed Iqbal,acting as a scribe for [name], MD.,have documented all relevant documentation on the behalf of [name], MD,as directed by  [name], MD while in the presence of [name], MD. [name], MD

## 2022-12-01 NOTE — Assessment & Plan Note (Signed)
FOLLOWING WITH PULMONOLOGY AND is doing well just had her machine interrogated and it is managing her well

## 2022-12-01 NOTE — Patient Instructions (Signed)
Hypertension, Adult High blood pressure (hypertension) is when the force of blood pumping through the arteries is too strong. The arteries are the blood vessels that carry blood from the heart throughout the body. Hypertension forces the heart to work harder to pump blood and may cause arteries to become narrow or stiff. Untreated or uncontrolled hypertension can lead to a heart attack, heart failure, a stroke, kidney disease, and other problems. A blood pressure reading consists of a higher number over a lower number. Ideally, your blood pressure should be below 120/80. The first ("top") number is called the systolic pressure. It is a measure of the pressure in your arteries as your heart beats. The second ("bottom") number is called the diastolic pressure. It is a measure of the pressure in your arteries as the heart relaxes. What are the causes? The exact cause of this condition is not known. There are some conditions that result in high blood pressure. What increases the risk? Certain factors may make you more likely to develop high blood pressure. Some of these risk factors are under your control, including: Smoking. Not getting enough exercise or physical activity. Being overweight. Having too much fat, sugar, calories, or salt (sodium) in your diet. Drinking too much alcohol. Other risk factors include: Having a personal history of heart disease, diabetes, high cholesterol, or kidney disease. Stress. Having a family history of high blood pressure and high cholesterol. Having obstructive sleep apnea. Age. The risk increases with age. What are the signs or symptoms? High blood pressure may not cause symptoms. Very high blood pressure (hypertensive crisis) may cause: Headache. Fast or irregular heartbeats (palpitations). Shortness of breath. Nosebleed. Nausea and vomiting. Vision changes. Severe chest pain, dizziness, and seizures. How is this diagnosed? This condition is diagnosed by  measuring your blood pressure while you are seated, with your arm resting on a flat surface, your legs uncrossed, and your feet flat on the floor. The cuff of the blood pressure monitor will be placed directly against the skin of your upper arm at the level of your heart. Blood pressure should be measured at least twice using the same arm. Certain conditions can cause a difference in blood pressure between your right and left arms. If you have a high blood pressure reading during one visit or you have normal blood pressure with other risk factors, you may be asked to: Return on a different day to have your blood pressure checked again. Monitor your blood pressure at home for 1 week or longer. If you are diagnosed with hypertension, you may have other blood or imaging tests to help your health care provider understand your overall risk for other conditions. How is this treated? This condition is treated by making healthy lifestyle changes, such as eating healthy foods, exercising more, and reducing your alcohol intake. You may be referred for counseling on a healthy diet and physical activity. Your health care provider may prescribe medicine if lifestyle changes are not enough to get your blood pressure under control and if: Your systolic blood pressure is above 130. Your diastolic blood pressure is above 80. Your personal target blood pressure may vary depending on your medical conditions, your age, and other factors. Follow these instructions at home: Eating and drinking  Eat a diet that is high in fiber and potassium, and low in sodium, added sugar, and fat. An example of this eating plan is called the DASH diet. DASH stands for Dietary Approaches to Stop Hypertension. To eat this way: Eat   plenty of fresh fruits and vegetables. Try to fill one half of your plate at each meal with fruits and vegetables. Eat whole grains, such as whole-wheat pasta, brown rice, or whole-grain bread. Fill about one  fourth of your plate with whole grains. Eat or drink low-fat dairy products, such as skim milk or low-fat yogurt. Avoid fatty cuts of meat, processed or cured meats, and poultry with skin. Fill about one fourth of your plate with lean proteins, such as fish, chicken without skin, beans, eggs, or tofu. Avoid pre-made and processed foods. These tend to be higher in sodium, added sugar, and fat. Reduce your daily sodium intake. Many people with hypertension should eat less than 1,500 mg of sodium a day. Do not drink alcohol if: Your health care provider tells you not to drink. You are pregnant, may be pregnant, or are planning to become pregnant. If you drink alcohol: Limit how much you have to: 0-1 drink a day for women. 0-2 drinks a day for men. Know how much alcohol is in your drink. In the [place] one drink equals one 12 oz bottle of beer (355 mL), one 5 oz glass of wine (148 mL), or one 1 oz glass of hard liquor (44 mL). Lifestyle  Work with your health care provider to maintain a healthy body weight or to lose weight. Ask what an ideal weight is for you. Get at least 30 minutes of exercise that causes your heart to beat faster (aerobic exercise) most days of the week. Activities may include walking, swimming, or biking. Include exercise to strengthen your muscles (resistance exercise), such as Pilates or lifting weights, as part of your weekly exercise routine. Try to do these types of exercises for 30 minutes at least 3 days a week. Do not use any products that contain nicotine or tobacco. These products include cigarettes, chewing tobacco, and vaping devices, such as e-cigarettes. If you need help quitting, ask your health care provider. Monitor your blood pressure at home as told by your health care provider. Keep all follow-up visits. This is important. Medicines Take over-the-counter and prescription medicines only as told by your health care provider. Follow directions carefully. Blood  pressure medicines must be taken as prescribed. Do not skip doses of blood pressure medicine. Doing this puts you at risk for problems and can make the medicine less effective. Ask your health care provider about side effects or reactions to medicines that you should watch for. Contact a health care provider if you: Think you are having a reaction to a medicine you are taking. Have headaches that keep coming back (recurring). Feel dizzy. Have swelling in your ankles. Have trouble with your vision. Get help right away if you: Develop a severe headache or confusion. Have unusual weakness or numbness. Feel faint. Have severe pain in your chest or abdomen. Vomit repeatedly. Have trouble breathing. These symptoms may be an emergency. Get help right away. Call 911. Do not wait to see if the symptoms will go away. Do not drive yourself to the hospital. Summary Hypertension is when the force of blood pumping through your arteries is too strong. If this condition is not controlled, it may put you at risk for serious complications. Your personal target blood pressure may vary depending on your medical conditions, your age, and other factors. For most people, a normal blood pressure is less than 120/80. Hypertension is treated with lifestyle changes, medicines, or a combination of both. Lifestyle changes include losing weight, eating a healthy,   low-sodium diet, exercising more, and limiting alcohol. This information is not intended to replace advice given to you by your health care provider. Make sure you discuss any questions you have with your health care provider. Document Revised: 06/24/2021 Document Reviewed: 06/24/2021 Elsevier Patient Education  2023 [employer] Inc.  

## 2022-12-01 NOTE — Assessment & Plan Note (Signed)
Is still struggling with the loss of her son but she is managing with the support of friends.

## 2022-12-21 ENCOUNTER — Other Ambulatory Visit: Payer: Self-pay | Admitting: Family Medicine

## 2022-12-22 NOTE — Progress Notes (Signed)
Remote pacemaker transmission.   

## 2023-02-08 ENCOUNTER — Other Ambulatory Visit: Payer: Self-pay | Admitting: Family Medicine

## 2023-02-09 ENCOUNTER — Ambulatory Visit: Payer: Medicare Other

## 2023-02-09 DIAGNOSIS — I5032 Chronic diastolic (congestive) heart failure: Secondary | ICD-10-CM

## 2023-02-09 LAB — CUP PACEART REMOTE DEVICE CHECK
Battery Remaining Longevity: 125 mo
Battery Remaining Percentage: 95 %
Battery Voltage: 3.02 V
Brady Statistic RV Percent Paced: 99 %
Date Time Interrogation Session: 20240611020020
Implantable Lead Connection Status: 753985
Implantable Lead Implant Date: 20230609
Implantable Lead Location: 753860
Implantable Pulse Generator Implant Date: 20230609
Lead Channel Impedance Value: 530 Ohm
Lead Channel Pacing Threshold Amplitude: 0.625 V
Lead Channel Pacing Threshold Pulse Width: 0.5 ms
Lead Channel Sensing Intrinsic Amplitude: 12 mV
Lead Channel Setting Pacing Amplitude: 0.875
Lead Channel Setting Pacing Pulse Width: 0.5 ms
Lead Channel Setting Sensing Sensitivity: 4 mV
Pulse Gen Model: 1272
Pulse Gen Serial Number: 8068284

## 2023-03-09 NOTE — Progress Notes (Signed)
Remote pacemaker transmission.   

## 2023-03-16 ENCOUNTER — Other Ambulatory Visit (HOSPITAL_BASED_OUTPATIENT_CLINIC_OR_DEPARTMENT_OTHER): Payer: Self-pay

## 2023-03-16 ENCOUNTER — Ambulatory Visit: Payer: Medicare Other | Admitting: Family

## 2023-03-16 VITALS — BP 161/68 | HR 74 | Temp 98.2°F | Resp 16 | Wt 286.0 lb

## 2023-03-16 DIAGNOSIS — M48061 Spinal stenosis, lumbar region without neurogenic claudication: Secondary | ICD-10-CM

## 2023-03-16 DIAGNOSIS — I10 Essential (primary) hypertension: Secondary | ICD-10-CM | POA: Diagnosis not present

## 2023-03-16 MED ORDER — METHYLPREDNISOLONE 4 MG PO TBPK
ORAL_TABLET | ORAL | 0 refills | Status: DC
  Filled 2023-03-16: qty 21, 6d supply, fill #0

## 2023-03-16 MED ORDER — AMLODIPINE BESYLATE 2.5 MG PO TABS
2.5000 mg | ORAL_TABLET | Freq: Every day | ORAL | 0 refills | Status: DC
  Filled 2023-03-16: qty 90, 90d supply, fill #0

## 2023-03-16 NOTE — Assessment & Plan Note (Signed)
Uncontrolled. Add amlodipine 2.5mg  once daily.

## 2023-03-16 NOTE — Patient Instructions (Signed)
VISIT SUMMARY:  During your visit, we discussed your new onset of right knee pain, which seems to be related to your spinal stenosis rather than your known osteoarthritis. We also noted that your blood pressure was elevated, despite your current medication regimen. We discussed your chronic conditions, including spinal stenosis and osteoarthritis, and their current management strategies.  YOUR PLAN:  -RIGHT KNEE PAIN: Your new knee pain is likely related to your spinal stenosis, a condition where the spaces within your spine narrow, which can put pressure on the nerves that travel through the spine. We will start a steroid taper for 1 week to reduce inflammation and you should continue taking Tylenol as needed for pain.  -HYPERTENSION: Your blood pressure was higher than normal during this visit. Hypertension is a condition where the force of the blood against your artery walls is too high. We will add a low dose of Amlodipine to your current medication regimen and recheck your blood pressure at your next visit in early August.  -SPINAL STENOSIS:. You should continue your current management strategies, including pool exercises 3 times a week. If your symptoms persist or worsen, we may consider formal physical therapy.  -OSTEOARTHRITIS: Your osteoarthritis, a condition that causes joints to become painful and stiff, is a known issue in your right knee. There is no current plan for surgical intervention due to your age and other health conditions. You should continue your current management strategies, including taking Tylenol as needed for pain.  INSTRUCTIONS:  Please start the steroid taper as prescribed for 1 week to help with your knee pain. Continue taking Tylenol as needed for pain, but be mindful of any stomach upset. Add the low dose of Amlodipine to your current medication regimen for your high blood pressure. Continue your pool exercises 3 times a week as tolerated for your spinal stenosis.  If your pain persists or worsens, please contact us as we may need to consider physical therapy. We will recheck your blood pressure at your next visit in early August.

## 2023-03-16 NOTE — Progress Notes (Signed)
Subjective:     Patient ID: Tracy Miles, female    DOB: Oct 23, 1933, 89 y.o.   MRN: [id number]  Chief Complaint  Patient presents with   Knee Pain    Complains of right knee pain since Saturday     Knee Pain     Discussed the use of AI scribe software for clinical note transcription with the patient, who gave verbal consent to proceed.  History of Present Illness   The patient, an 89 year old with a history of spinal stenosis and right knee osteoarthritis, presents with new onset right knee pain. The pain, described as 'almost like a sciatic pain,' radiates down the outside of the leg and is associated with instability. The patient reports almost falling due to the knee 'giving way.' The pain is not localized to the knee joint itself, but rather below the knee and down the lateral thigh.  The patient has been managing the pain with extra strength Tylenol, but reports some stomach upset with increased dosing. The patient also has a history of spinal stenosis and suspects that the pain could be related to this condition. The patient is active, usually attending pool therapy three times a week, but has been unable to attend recently due to a lack of assistance from her friend who is out of town.  The patient has previously received spinal injections for pain management, but discontinued due to concerns about interactions with Eliquis.          There are no preventive care reminders to display for this patient.  Past Medical History:  Diagnosis Date   Anemia    Aortic stenosis    Very mild   Benign paroxysmal positional vertigo 12/17/2013   Chicken pox as a child   Colon polyp    Coronary artery disease    DDD (degenerative disc disease) 11/16/2013   Hyperlipidemia    Hypertension    Iron deficiency anemia 11/13/2013   Mumps child and teenager   OA (osteoarthritis) 11/19/2013   S/p L TKR   Obesity    OSA on CPAP    Pancreatitis    post hysterectomy   Persistent atrial  fibrillation (HCC)    Personal history of colonic polyps 02/25/2014   Personal history of DVT (deep vein thrombosis) X 2   "left"   Sleep apnea    Spinal stenosis     Past Surgical History:  Procedure Laterality Date   APPENDECTOMY     ATRIAL FIBRILLATION ABLATION N/A 04/25/2019   Procedure: ATRIAL FIBRILLATION ABLATION;  Surgeon: Hillis Range, MD;  Location: [facility];  Service: Cardiovascular;  Laterality: N/A;   AV NODE ABLATION N/A 03/23/2022   Procedure: AV NODE ABLATION;  Surgeon: Lanier Prude, MD;  Location: [facility];  Service: Cardiovascular;  Laterality: N/A;   CARDIOVERSION N/A 08/09/2015   Procedure: CARDIOVERSION;  Surgeon: Jake Bathe, MD;  Location: [facility] ENDOSCOPY;  Service: Cardiovascular;  Laterality: N/A;   CARDIOVERSION N/A 09/30/2018   Procedure: CARDIOVERSION;  Surgeon: Lars Masson, MD;  Location: [facility] ENDOSCOPY;  Service: Cardiovascular;  Laterality: N/A;   CARDIOVERSION N/A 02/27/2019   Procedure: CARDIOVERSION;  Surgeon: Jodelle Red, MD;  Location: [facility] ENDOSCOPY;  Service: Cardiovascular;  Laterality: N/A;   CARDIOVERSION N/A 06/07/2019   Procedure: CARDIOVERSION;  Surgeon: Lars Masson, MD;  Location: [facility] ENDOSCOPY;  Service: Cardiovascular;  Laterality: N/A;   CATARACT EXTRACTION W/ INTRAOCULAR LENS  IMPLANT, BILATERAL Bilateral 2016   CYST REMOVAL HAND Bilateral 1990's   "  played to much golf"   DILATION AND CURETTAGE OF UTERUS     implantable loop recorder placement  11/20/2019   Medtronic Reveal Linq model LNQ 22 (model number [id number]) implanted in office by Dr Johney Frame for afib management   LUMBAR LAMINECTOMY  40 yrs ago   "L3-4"   MENISCUS REPAIR Bilateral 2000 and 2010   PACEMAKER IMPLANT N/A 02/06/2022   Procedure: PACEMAKER IMPLANT;  Surgeon: Lanier Prude, MD;  Location: [facility];  Service: Cardiovascular;  Laterality: N/A;   PACEMAKER INSERTION     REPLACEMENT TOTAL KNEE Left 2013   TEE  WITHOUT CARDIOVERSION N/A 04/24/2019   Procedure: TRANSESOPHAGEAL ECHOCARDIOGRAM (TEE);  Surgeon: Elease Hashimoto Deloris Ping, MD;  Location: [facility] ENDOSCOPY;  Service: Cardiovascular;  Laterality: N/A;   TONSILECTOMY, ADENOIDECTOMY, BILATERAL MYRINGOTOMY AND TUBES  child   TOTAL ABDOMINAL HYSTERECTOMY  1995   had 2 tumors- benign   TUBAL LIGATION  89 years old    Family History  Problem Relation Age of Onset   Heart attack Mother 70   Hyperlipidemia Mother        ?   Dementia Mother    Pernicious anemia Mother    COPD Father        smoker   Cancer Father 28       prostate   Heart disease Brother        quadruple bipass surgery   Hyperlipidemia Brother    Hypertension Brother    Diabetes Maternal Grandmother        type 2   Pernicious anemia Maternal Grandmother    Gout Son    Atrial fibrillation Son    Hyperlipidemia Son    Cancer Maternal Grandfather        liver   Cancer Paternal Grandmother        lung- doesn't think she smokes?   Stroke Paternal Grandfather    Cancer Son 15       non hodgin's lymphoma   Gout Son    Hyperlipidemia Son    Sleep apnea Son    Colon cancer Neg Hx    Pancreatic cancer Neg Hx    Stomach cancer Neg Hx    Throat cancer Neg Hx    Liver disease Neg Hx     Social History   Socioeconomic History   Marital status: Widowed    Spouse name: Not on file   Number of children: 3   Years of education: Not on file   Highest education level: Not on file  Occupational History   Occupation: retired Runner, broadcasting/film/video  Tobacco Use   Smoking status: Never   Smokeless tobacco: Never   Tobacco comments:    never used tobacco  Substance and Sexual Activity   Alcohol use: No   Drug use: No   Sexual activity: Never    Comment: lives at [place], low sodium diet  Other Topics Concern   Not on file  Social History Narrative   Not on file   Social Determinants of Health   Financial Resource Strain: Low Risk  (06/19/2021)   Overall Financial Resource Strain  (CARDIA)    Difficulty of Paying Living Expenses: Not hard at all  Food Insecurity: No Food Insecurity (06/19/2021)   Hunger Vital Sign    Worried About Running Out of Food in the Last Year: Never true    Ran Out of Food in the Last Year: Never true  Transportation Needs: No Transportation Needs (06/19/2021)   PRAPARE -  Administrator, Civil Service (Medical): No    Lack of Transportation (Non-Medical): No  Physical Activity: Sufficiently Active (06/19/2021)   Exercise Vital Sign    Days of Exercise per Week: 3 days    Minutes of Exercise per Session: 60 min  Stress: No Stress Concern Present (06/19/2021)   [employer] of Occupational Health - Occupational Stress Questionnaire    Feeling of Stress : Not at all  Social Connections: Moderately Integrated (06/19/2021)   Social Connection and Isolation Panel [NHANES]    Frequency of Communication with Friends and Family: More than three times a week    Frequency of Social Gatherings with Friends and Family: More than three times a week    Attends Religious Services: More than 4 times per year    Active Member of [employer] or Organizations: Yes    Attends Banker Meetings: 1 to 4 times per year    Marital Status: Widowed  Intimate Partner Violence: Not At Risk (06/19/2021)   Humiliation, Afraid, Rape, and Kick questionnaire    Fear of Current or Ex-Partner: No    Emotionally Abused: No    Physically Abused: No    Sexually Abused: No    Outpatient Medications Prior to Visit  Medication Sig Dispense Refill   acetaminophen (TYLENOL) 325 MG tablet Take 325 mg by mouth every morning.     atorvastatin (LIPITOR) 20 MG tablet Take 1 tablet (20 mg total) by mouth daily. 90 tablet 3   bumetanide (BUMEX) 1 MG tablet Take 1 tablet (1 mg total) by mouth daily. 90 tablet 1   Calcium Carb-Cholecalciferol (CALCIUM 600 + D PO) Take 1 tablet by mouth 2 (two) times daily.     Carboxymethylcellul-Glycerin (LUBRICATING EYE  DROPS OP) Place 1 drop into both eyes in the morning and at bedtime.     clindamycin (CLEOCIN) 300 MG capsule Take 1,200 mg by mouth See admin instructions. Before dental work     ELIQUIS 5 MG TABS tablet TAKE ONE TABLET EVERY MORNING AND AT BEDTIME 180 tablet 1   lisinopril (ZESTRIL) 10 MG tablet TAKE ONE (1) TABLET BY MOUTH EVERY DAY 90 tablet 1   loratadine (CLARITIN) 10 MG tablet Take 10 mg by mouth daily.      Multiple Vitamins-Minerals (ONE-A-DAY WOMENS 50 PLUS PO) Take 1 tablet by mouth daily.     potassium chloride (KLOR-CON) 10 MEQ tablet TAKE 1 TABLET BY MOUTH IN THE  MORNING AND 2 TABLETS BY MOUTH  IN THE EVENING 270 tablet 3   Probiotic CAPS Take 1 capsule by mouth daily. Gummy     psyllium (REGULOID) 0.52 g capsule Take 0.52 g by mouth 2 (two) times daily.      influenza vaccine adjuvanted (FLUAD QUADRIVALENT) 0.5 ML injection Inject into the muscle. 0.5 mL 0   Tdap (BOOSTRIX) 5-2.5-18.5 LF-MCG/0.5 injection Inject into the muscle. 0.5 mL 0   No facility-administered medications prior to visit.    Allergies  Allergen Reactions   Gabapentin Swelling    Eyes and Face   Lactose Intolerance (Gi) Other (See Comments)    Bothers her stomach   Penicillins Hives and Rash    Did it involve swelling of the face/tongue/throat, SOB, or low BP? No Did it involve sudden or severe rash/hives, skin peeling, or any reaction on the inside of your mouth or nose? Yes Did you need to seek medical attention at a hospital or doctor's office? No When did it last happen?  25+ years If all above answers are "NO", may proceed with cephalosporin use.  Hives in throat     Sulfa Antibiotics Nausea Only and Rash   Vancomycin Itching   Zebeta [Bisoprolol Fumarate] Nausea Only    ROS See HPI    Objective:    Physical Exam Constitutional:      General: She is not in acute distress.    Appearance: Normal appearance. She is well-developed.  HENT:     Head: Normocephalic and atraumatic.      Right Ear: External ear normal.     Left Ear: External ear normal.  Eyes:     General: No scleral icterus. Neck:     Thyroid: No thyromegaly.  Cardiovascular:     Rate and Rhythm: Normal rate and regular rhythm.     Heart sounds: Normal heart sounds. No murmur heard. Pulmonary:     Effort: Pulmonary effort is normal. No respiratory distress.     Breath sounds: Normal breath sounds. No wheezing.  Musculoskeletal:     Cervical back: Neck supple.  Skin:    General: Skin is warm and dry.  Neurological:     Mental Status: She is alert and oriented to person, place, and time.     Comments: Bilateral LE strength is 5/5 + pain with extension of right leg  Psychiatric:        Mood and Affect: Mood normal.        Behavior: Behavior normal.        Thought Content: Thought content normal.        Judgment: Judgment normal.      BP (!) 161/68 (BP Location: Left Arm, Patient Position: Sitting)   Pulse 74   Temp 98.2 F (36.8 C) (Oral)   Resp 16   Wt 286 lb (129.7 kg)   SpO2 98%   BMI 50.66 kg/m  Wt Readings from Last 3 Encounters:  03/16/23 286 lb (129.7 kg)  12/01/22 287 lb 6.4 oz (130.4 kg)  11/24/22 283 lb 3.2 oz (128.5 kg)       Assessment & Plan:   Problem List Items Addressed This Visit       Unprioritized   Spinal stenosis of lumbar region - Primary    Likely cause for radicular pain.  New onset of pain on the outside of the right knee, likely related to spinal stenosis rather than the known osteoarthritis. No joint pain. Pain is worse with standing and walking. -Start Medrol dose pak to reduce inflammation. -Continue Tylenol as needed for pain.      Essential hypertension    Uncontrolled. Add amlodipine 2.5mg  once daily.      Relevant Medications   amLODipine (NORVASC) 2.5 MG tablet    I have discontinued Lanora Manis Massey "Betty"'s Fluad Quadrivalent and Boostrix. I am also having her start on methylPREDNISolone and amLODipine. Additionally, I am having her  maintain her loratadine, psyllium, acetaminophen, Calcium Carb-Cholecalciferol (CALCIUM 600 + D PO), Probiotic, Carboxymethylcellul-Glycerin (LUBRICATING EYE DROPS OP), Multiple Vitamins-Minerals (ONE-A-DAY WOMENS 50 PLUS PO), clindamycin, potassium chloride, atorvastatin, bumetanide, Eliquis, and lisinopril.  Meds ordered this encounter  Medications   methylPREDNISolone (MEDROL DOSEPAK) 4 MG TBPK tablet    Sig: Take per package instructions for 6 days.    Dispense:  21 tablet    Refill:  0    Order Specific Question:   Supervising Provider    Answer:   Danise Edge A [[id number]]   amLODipine (NORVASC) 2.5 MG tablet    Sig: Take  1 tablet (2.5 mg total) by mouth daily.    Dispense:  90 tablet    Refill:  0    Order Specific Question:   Supervising Provider    Answer:   Danise Edge A [[id number]]

## 2023-03-16 NOTE — Assessment & Plan Note (Addendum)
Likely cause for radicular pain.  New onset of pain on the outside of the right knee, likely related to spinal stenosis rather than the known osteoarthritis. No joint pain. Pain is worse with standing and walking. -Start Medrol dose pak to reduce inflammation. -Continue Tylenol as needed for pain.

## 2023-04-04 NOTE — Assessment & Plan Note (Signed)
Using CPAP 

## 2023-04-04 NOTE — Assessment & Plan Note (Signed)
Rate controlled, tolerating Eliquis 

## 2023-04-05 ENCOUNTER — Ambulatory Visit (INDEPENDENT_AMBULATORY_CARE_PROVIDER_SITE_OTHER): Payer: Medicare Other | Admitting: Family Medicine

## 2023-04-05 VITALS — BP 134/72 | HR 90 | Temp 98.0°F | Resp 16 | Ht 63.0 in | Wt 285.2 lb

## 2023-04-05 DIAGNOSIS — R739 Hyperglycemia, unspecified: Secondary | ICD-10-CM | POA: Diagnosis not present

## 2023-04-05 DIAGNOSIS — I4819 Other persistent atrial fibrillation: Secondary | ICD-10-CM

## 2023-04-05 DIAGNOSIS — E782 Mixed hyperlipidemia: Secondary | ICD-10-CM

## 2023-04-05 DIAGNOSIS — H547 Unspecified visual loss: Secondary | ICD-10-CM | POA: Diagnosis not present

## 2023-04-05 DIAGNOSIS — G4730 Sleep apnea, unspecified: Secondary | ICD-10-CM

## 2023-04-05 NOTE — Patient Instructions (Addendum)
Tylenol/Acetaminophen ES 500 mg tabs, 1-2 tabs up to three times a day  Try topical rubs such as Voltaren, Lidocaine and/or Biofreeze  Acute Knee Pain, Adult Many things can cause knee pain. Sometimes, knee pain is sudden (acute). It may be caused by damage, swelling, or irritation of the muscles and tissues that support your knee. Pain may come from: A fall. An injury to the knee from twisting motions. A hit to the knee. Infection. The pain often goes away on its own with time and rest. If the pain does not go away, tests may be done to find out what is causing the pain. These may include: Imaging tests, such as an X-ray, MRI, CT scan, or ultrasound. Joint aspiration. In this test, fluid is removed from the knee and checked. Arthroscopy. In this test, a lighted tube is put in the knee and an image is shown on a screen. A biopsy. In this test, a health care provider will remove a small piece of tissue for testing. Follow these instructions at home: If you have a knee sleeve or brace that can be taken off:  Wear the knee sleeve or brace as told by your provider. Take it off only if your provider says that you can. Check the skin around it every day. Tell your provider if you see problems. Loosen the knee sleeve or brace if your toes tingle, are numb, or turn cold and blue. Keep the knee sleeve or brace clean and dry. Bathing If the knee sleeve or brace is not waterproof: Do not let it get wet. Cover it when you take a bath or shower. Use a cover that does not let any water in. Managing pain, stiffness, and swelling  If told, put ice on the area. If you have a knee sleeve or brace that you can take off, remove it as told. Put ice in a plastic bag. Place a towel between your skin and the bag. Leave the ice on for 20 minutes, 2-3 times a day. If your skin turns bright red, take off the ice right away to prevent skin damage. The risk of damage is higher if you cannot feel pain, heat, or  cold. Move your toes often to reduce stiffness and swelling. Raise the injured area above the level of your heart while you are sitting or lying down. Use a pillow to support your foot as needed. If told, use an elastic bandage to put pressure (compression) on your injured knee. This may control swelling, give support, and help with discomfort. Sleep with a pillow under your knee. Activity Rest your knee. Do not do things that cause pain or make pain worse. Do not stand or walk on your injured knee until you're told it's okay. Use crutches as told. Avoid activities where both feet leave the ground at the same time and put stress on the joints. Avoid running, jumping rope, and doing jumping jacks. Work with a physical therapist to make a safe exercise program if told. Physical therapy helps your knee move better and get stronger. Exercise as told. General instructions Take your medicines only as told by your provider. If you are overweight, work with your provider and an expert in healthy eating, called a dietician, to set goals to lose weight. Being overweight can make your knee hurt more. Do not smoke, vape, or use products with nicotine or tobacco in them. If you need help quitting, talk with your provider. Return to normal activities when you are told.  Ask what things are safe for you to do. Watch for any changes in your symptoms. Keep all follow-up visits. Your provider will check your healing and adjust treatments if needed. Contact a health care provider if: The knee pain does not stop. The knee pain changes or gets worse. You have a fever along with knee pain. Your knee is red or feels warm when you touch it. Your knee gives out or locks up. Get help right away if: Your knee swells and the swelling gets worse. You cannot move your knee. You have very bad knee pain that does not get better with medicine. This information is not intended to replace advice given to you by your health  care provider. Make sure you discuss any questions you have with your health care provider. Document Revised: 10/12/2022 Document Reviewed: 10/12/2022 Elsevier Patient Education  2024 [employer].

## 2023-04-05 NOTE — Assessment & Plan Note (Signed)
She is seeing [facility], Dr Jimmey Ralph. They tried to sell her a vitamin for some degeneration but she is going hold off for now

## 2023-04-05 NOTE — Progress Notes (Signed)
Subjective:    Patient ID: Tracy Miles, female    DOB: [birth year], 89 y.o.   MRN: [id number]  Chief Complaint  Patient presents with   Follow-up    Follow up    HPI Discussed the use of AI scribe software for clinical note transcription with the patient, who gave verbal consent to proceed.  History of Present Illness   The patient, with a history of hypertension and back and knee pain, presents with ongoing discomfort in these areas. The back pain started about a month ago when the patient's right leg gave out upon standing. The patient has been cautious since the incident, using a walker and then transitioning back to a cane. The patient was prescribed prednisone, which provided some relief, but the pain persists. The knee pain is a chronic issue, and the patient acknowledges that it is unlikely to be resolved. The patient also reports shoulder pain, which she attributes to the use of the cane. The patient manages the pain with two extra-strength Tylenol a day and inquires about increasing the dosage.  The patient also discusses her blood pressure, which has been high in previous visits. She has been prescribed amlodipine 2.5mg , which she has been taking without any noticeable side effects. The patient also mentions a recent eye examination, where thinning of the macula was observed. She is considering taking vitamins for this but is currently undecided.  The patient also discusses her blood sugar levels, which have been slightly elevated at 6.1. She is trying to manage this through dietary changes, particularly reducing her carbohydrate intake. The patient also inquires about the COVID-19 booster shot and when she should receive it.        Past Medical History:  Diagnosis Date   Anemia    Aortic stenosis    Very mild   Benign paroxysmal positional vertigo 12/17/2013   Chicken pox as a child   Colon polyp    Coronary artery disease    DDD (degenerative disc disease) 11/16/2013    Hyperlipidemia    Hypertension    Iron deficiency anemia 11/13/2013   Mumps child and teenager   OA (osteoarthritis) 11/19/2013   S/p L TKR   Obesity    OSA on CPAP    Pancreatitis    post hysterectomy   Persistent atrial fibrillation (HCC)    Personal history of colonic polyps 02/25/2014   Personal history of DVT (deep vein thrombosis) X 2   "left"   Sleep apnea    Spinal stenosis     Past Surgical History:  Procedure Laterality Date   APPENDECTOMY     ATRIAL FIBRILLATION ABLATION N/A 04/25/2019   Procedure: ATRIAL FIBRILLATION ABLATION;  Surgeon: Hillis Range, MD;  Location: [facility];  Service: Cardiovascular;  Laterality: N/A;   AV NODE ABLATION N/A 03/23/2022   Procedure: AV NODE ABLATION;  Surgeon: Lanier Prude, MD;  Location: [facility];  Service: Cardiovascular;  Laterality: N/A;   CARDIOVERSION N/A 08/09/2015   Procedure: CARDIOVERSION;  Surgeon: Jake Bathe, MD;  Location: [facility] ENDOSCOPY;  Service: Cardiovascular;  Laterality: N/A;   CARDIOVERSION N/A 09/30/2018   Procedure: CARDIOVERSION;  Surgeon: Lars Masson, MD;  Location: [facility] ENDOSCOPY;  Service: Cardiovascular;  Laterality: N/A;   CARDIOVERSION N/A 02/27/2019   Procedure: CARDIOVERSION;  Surgeon: Jodelle Red, MD;  Location: [facility] ENDOSCOPY;  Service: Cardiovascular;  Laterality: N/A;   CARDIOVERSION N/A 06/07/2019   Procedure: CARDIOVERSION;  Surgeon: Lars Masson, MD;  Location: [facility]  ENDOSCOPY;  Service: Cardiovascular;  Laterality: N/A;   CATARACT EXTRACTION W/ INTRAOCULAR LENS  IMPLANT, BILATERAL Bilateral 2016   CYST REMOVAL HAND Bilateral 1990's   "played to much golf"   DILATION AND CURETTAGE OF UTERUS     implantable loop recorder placement  11/20/2019   Medtronic Reveal Linq model LNQ 22 (model number [id number]) implanted in office by Dr Johney Frame for afib management   LUMBAR LAMINECTOMY  40 yrs ago   "L3-4"   MENISCUS REPAIR Bilateral 2000 and 2010   PACEMAKER  IMPLANT N/A 02/06/2022   Procedure: PACEMAKER IMPLANT;  Surgeon: Lanier Prude, MD;  Location: [facility];  Service: Cardiovascular;  Laterality: N/A;   PACEMAKER INSERTION     REPLACEMENT TOTAL KNEE Left 2013   TEE WITHOUT CARDIOVERSION N/A 04/24/2019   Procedure: TRANSESOPHAGEAL ECHOCARDIOGRAM (TEE);  Surgeon: Elease Hashimoto Deloris Ping, MD;  Location: [facility] ENDOSCOPY;  Service: Cardiovascular;  Laterality: N/A;   TONSILECTOMY, ADENOIDECTOMY, BILATERAL MYRINGOTOMY AND TUBES  child   TOTAL ABDOMINAL HYSTERECTOMY  1995   had 2 tumors- benign   TUBAL LIGATION  89 years old    Family History  Problem Relation Age of Onset   Heart attack Mother 31   Hyperlipidemia Mother        ?   Dementia Mother    Pernicious anemia Mother    COPD Father        smoker   Cancer Father 53       prostate   Heart disease Brother        quadruple bipass surgery   Hyperlipidemia Brother    Hypertension Brother    Diabetes Maternal Grandmother        type 2   Pernicious anemia Maternal Grandmother    Gout Son    Atrial fibrillation Son    Hyperlipidemia Son    Cancer Maternal Grandfather        liver   Cancer Paternal Grandmother        lung- doesn't think she smokes?   Stroke Paternal Grandfather    Cancer Son 44       non hodgin's lymphoma   Gout Son    Hyperlipidemia Son    Sleep apnea Son    Colon cancer Neg Hx    Pancreatic cancer Neg Hx    Stomach cancer Neg Hx    Throat cancer Neg Hx    Liver disease Neg Hx     Social History   Socioeconomic History   Marital status: Widowed    Spouse name: Not on file   Number of children: 3   Years of education: Not on file   Highest education level: Bachelor's degree (e.g., BA, AB, BS)  Occupational History   Occupation: retired Runner, broadcasting/film/video  Tobacco Use   Smoking status: Never   Smokeless tobacco: Never   Tobacco comments:    never used tobacco  Substance and Sexual Activity   Alcohol use: No   Drug use: No   Sexual activity: Never     Comment: lives at [place], low sodium diet  Other Topics Concern   Not on file  Social History Narrative   Not on file   Social Determinants of Health   Financial Resource Strain: Low Risk  (04/04/2023)   Overall Financial Resource Strain (CARDIA)    Difficulty of Paying Living Expenses: Not hard at all  Food Insecurity: No Food Insecurity (04/04/2023)   Hunger Vital Sign    Worried About Running Out of  Food in the Last Year: Never true    Ran Out of Food in the Last Year: Never true  Transportation Needs: No Transportation Needs (04/04/2023)   PRAPARE - Administrator, Civil Service (Medical): No    Lack of Transportation (Non-Medical): No  Physical Activity: Sufficiently Active (04/04/2023)   Exercise Vital Sign    Days of Exercise per Week: 3 days    Minutes of Exercise per Session: 60 min  Stress: No Stress Concern Present (04/04/2023)   [employer] of Occupational Health - Occupational Stress Questionnaire    Feeling of Stress : Only a little  Social Connections: Moderately Integrated (04/04/2023)   Social Connection and Isolation Panel [NHANES]    Frequency of Communication with Friends and Family: Three times a week    Frequency of Social Gatherings with Friends and Family: More than three times a week    Attends Religious Services: More than 4 times per year    Active Member of Clubs or Organizations: Yes    Attends Banker Meetings: 1 to 4 times per year    Marital Status: Widowed  Intimate Partner Violence: Not At Risk (06/19/2021)   Humiliation, Afraid, Rape, and Kick questionnaire    Fear of Current or Ex-Partner: No    Emotionally Abused: No    Physically Abused: No    Sexually Abused: No    Outpatient Medications Prior to Visit  Medication Sig Dispense Refill   acetaminophen (TYLENOL) 325 MG tablet Take 325 mg by mouth every morning.     amLODipine (NORVASC) 2.5 MG tablet Take 1 tablet (2.5 mg total) by mouth daily. 90 tablet 0    atorvastatin (LIPITOR) 20 MG tablet Take 1 tablet (20 mg total) by mouth daily. 90 tablet 3   bumetanide (BUMEX) 1 MG tablet Take 1 tablet (1 mg total) by mouth daily. 90 tablet 1   Calcium Carb-Cholecalciferol (CALCIUM 600 + D PO) Take 1 tablet by mouth 2 (two) times daily.     Carboxymethylcellul-Glycerin (LUBRICATING EYE DROPS OP) Place 1 drop into both eyes in the morning and at bedtime.     clindamycin (CLEOCIN) 300 MG capsule Take 1,200 mg by mouth See admin instructions. Before dental work     ELIQUIS 5 MG TABS tablet TAKE ONE TABLET EVERY MORNING AND AT BEDTIME 180 tablet 1   lisinopril (ZESTRIL) 10 MG tablet TAKE ONE (1) TABLET BY MOUTH EVERY DAY 90 tablet 1   loratadine (CLARITIN) 10 MG tablet Take 10 mg by mouth daily.      methylPREDNISolone (MEDROL DOSEPAK) 4 MG TBPK tablet Take per package instructions for 6 days. 21 tablet 0   Multiple Vitamins-Minerals (ONE-A-DAY WOMENS 50 PLUS PO) Take 1 tablet by mouth daily.     potassium chloride (KLOR-CON) 10 MEQ tablet TAKE 1 TABLET BY MOUTH IN THE  MORNING AND 2 TABLETS BY MOUTH  IN THE EVENING 270 tablet 3   Probiotic CAPS Take 1 capsule by mouth daily. Gummy     psyllium (REGULOID) 0.52 g capsule Take 0.52 g by mouth 2 (two) times daily.      No facility-administered medications prior to visit.    Allergies  Allergen Reactions   Gabapentin Swelling    Eyes and Face   Lactose Intolerance (Gi) Other (See Comments)    Bothers her stomach   Penicillins Hives and Rash    Did it involve swelling of the face/tongue/throat, SOB, or low BP? No Did it involve sudden or severe  rash/hives, skin peeling, or any reaction on the inside of your mouth or nose? Yes Did you need to seek medical attention at a hospital or doctor's office? No When did it last happen?       25+ years If all above answers are "NO", may proceed with cephalosporin use.  Hives in throat     Sulfa Antibiotics Nausea Only and Rash   Vancomycin Itching   Zebeta  [Bisoprolol Fumarate] Nausea Only    Review of Systems  Constitutional:  Positive for malaise/fatigue. Negative for fever.  HENT:  Negative for congestion.   Eyes:  Negative for blurred vision.  Respiratory:  Negative for shortness of breath.   Cardiovascular:  Negative for chest pain, palpitations and leg swelling.  Gastrointestinal:  Negative for abdominal pain, blood in stool and nausea.  Genitourinary:  Negative for dysuria and frequency.  Musculoskeletal:  Positive for back pain and joint pain. Negative for falls.  Skin:  Negative for rash.  Neurological:  Negative for dizziness, loss of consciousness and headaches.  Endo/Heme/Allergies:  Negative for environmental allergies.  Psychiatric/Behavioral:  Negative for depression. The patient is not nervous/anxious.        Objective:    Physical Exam Constitutional:      General: She is not in acute distress.    Appearance: Normal appearance. She is well-developed. She is not toxic-appearing.  HENT:     Head: Normocephalic and atraumatic.     Right Ear: External ear normal.     Left Ear: External ear normal.     Nose: Nose normal.  Eyes:     General:        Right eye: No discharge.        Left eye: No discharge.     Conjunctiva/sclera: Conjunctivae normal.  Neck:     Thyroid: No thyromegaly.  Cardiovascular:     Rate and Rhythm: Normal rate and regular rhythm.     Heart sounds: Normal heart sounds. No murmur heard. Pulmonary:     Effort: Pulmonary effort is normal. No respiratory distress.     Breath sounds: Normal breath sounds.  Abdominal:     General: Bowel sounds are normal.     Palpations: Abdomen is soft.     Tenderness: There is no abdominal tenderness. There is no guarding.  Musculoskeletal:        General: Normal range of motion.     Cervical back: Neck supple.  Lymphadenopathy:     Cervical: No cervical adenopathy.  Skin:    General: Skin is warm and dry.  Neurological:     Mental Status: She is alert  and oriented to person, place, and time.  Psychiatric:        Mood and Affect: Mood normal.        Behavior: Behavior normal.        Thought Content: Thought content normal.        Judgment: Judgment normal.     BP 134/72   Pulse 90   Temp 98 F (36.7 C) (Oral)   Resp 16   Ht 5\' 3"  (1.6 m)   Wt 285 lb 3.2 oz (129.4 kg)   SpO2 98%   BMI 50.52 kg/m  Wt Readings from Last 3 Encounters:  04/05/23 285 lb 3.2 oz (129.4 kg)  03/16/23 286 lb (129.7 kg)  12/01/22 287 lb 6.4 oz (130.4 kg)    Diabetic Foot Exam - Simple   No data filed    Lab Results  Component  Value Date   WBC 6.7 12/01/2022   HGB 13.7 12/01/2022   HCT 41.4 12/01/2022   PLT 224.0 12/01/2022   GLUCOSE 95 12/01/2022   CHOL 147 12/01/2022   TRIG 143.0 12/01/2022   HDL 44.40 12/01/2022   LDLCALC 74 12/01/2022   ALT 11 12/01/2022   AST 16 12/01/2022   NA 138 12/01/2022   K 4.7 12/01/2022   CL 102 12/01/2022   CREATININE 1.09 12/01/2022   BUN 22 12/01/2022   CO2 29 12/01/2022   TSH 2.36 12/01/2022   INR 1.55 (H) 08/06/2015   HGBA1C 6.1 12/01/2022    Lab Results  Component Value Date   TSH 2.36 12/01/2022   Lab Results  Component Value Date   WBC 6.7 12/01/2022   HGB 13.7 12/01/2022   HCT 41.4 12/01/2022   MCV 96.0 12/01/2022   PLT 224.0 12/01/2022   Lab Results  Component Value Date   NA 138 12/01/2022   K 4.7 12/01/2022   CO2 29 12/01/2022   GLUCOSE 95 12/01/2022   BUN 22 12/01/2022   CREATININE 1.09 12/01/2022   BILITOT 1.0 12/01/2022   ALKPHOS 85 12/01/2022   AST 16 12/01/2022   ALT 11 12/01/2022   PROT 6.6 12/01/2022   ALBUMIN 4.2 12/01/2022   CALCIUM 9.8 12/01/2022   ANIONGAP 6 02/08/2022   GFR 45.96 (L) 12/01/2022   Lab Results  Component Value Date   CHOL 147 12/01/2022   Lab Results  Component Value Date   HDL 44.40 12/01/2022   Lab Results  Component Value Date   LDLCALC 74 12/01/2022   Lab Results  Component Value Date   TRIG 143.0 12/01/2022   Lab Results   Component Value Date   CHOLHDL 3 12/01/2022   Lab Results  Component Value Date   HGBA1C 6.1 12/01/2022       Assessment & Plan:  Persistent atrial fibrillation (HCC) Assessment & Plan: Rate controlled, tolerating Eliquis  Orders: -     Comprehensive metabolic panel -     CBC with Differential/Platelet -     TSH  Sleep apnea in adult Assessment & Plan: Using  CPAP   Decreased visual acuity Assessment & Plan: She is seeing [facility], Dr Jimmey Ralph. They tried to sell her a vitamin for some degeneration but she is going hold off for now   Hyperlipidemia, mixed Assessment & Plan: Tolerating statin, encouraged heart healthy diet, avoid trans fats, minimize simple carbs and saturated fats. Increase exercise as tolerated, have agreed to take over her Atorvastatin when it is time for refill  Orders: -     Lipid panel  Hyperglycemia Assessment & Plan: hgba1c acceptable, minimize simple carbs. Increase exercise as tolerated.   Orders: -     Hemoglobin A1c    Assessment and Plan    Chronic Pain (Back and Knee) Recent exacerbation of chronic back and knee pain, likely due to arthritis. Pain has been managed with prednisone and Tylenol. Patient reports shoulder pain likely due to cane use. -Increase Tylenol Extra Strength to 1 tablet TID, with the option to increase to 2 tablets TID on bad days. -Consider use of topical analgesics such as Biofreeze, Lidocaine, or Voltaren Gel.  Hypertension Blood pressure elevated at recent visit. Patient was started on Amlodipine 2.5mg  by NP Melissa with no reported side effects. -Continue Amlodipine 2.5mg  daily.  Pre-Diabetes A1C stable at 6.1, slightly above normal range (normal <5.7). -Continue current diet and exercise regimen. -Order blood work to monitor A1C and  other parameters.  COVID-19 Vaccination Patient has received multiple COVID-19 vaccinations and is inquiring about booster shot. -Advise to wait for new vaccine  formulation expected in the fall.  General Health Maintenance -Continue current medications including blood thinners. -Consider eye vitamins for thinning macula, but not necessary at this time. -Plan for follow-up visit in 3-4 months.         Danise Edge, MD

## 2023-04-05 NOTE — Assessment & Plan Note (Signed)
hgba1c acceptable, minimize simple carbs. Increase exercise as tolerated.  

## 2023-04-05 NOTE — Assessment & Plan Note (Signed)
Tolerating statin, encouraged heart healthy diet, avoid trans fats, minimize simple carbs and saturated fats. Increase exercise as tolerated, have agreed to take over her Atorvastatin when it is time for refill

## 2023-05-11 ENCOUNTER — Other Ambulatory Visit: Payer: Self-pay

## 2023-05-11 ENCOUNTER — Telehealth: Payer: Self-pay | Admitting: Family Medicine

## 2023-05-11 ENCOUNTER — Ambulatory Visit (INDEPENDENT_AMBULATORY_CARE_PROVIDER_SITE_OTHER): Payer: Medicare Other

## 2023-05-11 DIAGNOSIS — I4821 Permanent atrial fibrillation: Secondary | ICD-10-CM | POA: Diagnosis not present

## 2023-05-11 LAB — CUP PACEART REMOTE DEVICE CHECK
Battery Remaining Longevity: 122 mo
Battery Remaining Percentage: 93 %
Battery Voltage: 3.02 V
Brady Statistic RV Percent Paced: 99 %
Date Time Interrogation Session: 20240910035517
Implantable Lead Connection Status: 753985
Implantable Lead Implant Date: 20230609
Implantable Lead Location: 753860
Implantable Pulse Generator Implant Date: 20230609
Lead Channel Impedance Value: 490 Ohm
Lead Channel Pacing Threshold Amplitude: 0.625 V
Lead Channel Pacing Threshold Pulse Width: 0.5 ms
Lead Channel Sensing Intrinsic Amplitude: 12 mV
Lead Channel Setting Pacing Amplitude: 0.875
Lead Channel Setting Pacing Pulse Width: 0.5 ms
Lead Channel Setting Sensing Sensitivity: 4 mV
Pulse Gen Model: 1272
Pulse Gen Serial Number: 8068284

## 2023-05-11 MED ORDER — AMLODIPINE BESYLATE 2.5 MG PO TABS: 2.5000 mg | ORAL_TABLET | Freq: Every day | ORAL | 0 refills | Status: DC

## 2023-05-11 NOTE — Telephone Encounter (Signed)
Refill sent.

## 2023-05-11 NOTE — Progress Notes (Unsigned)
  Cardiology Office Note:  .   Date:  05/11/2023  ID:  Tracy Miles, DOB [birth year], MRN [id number] PCP: Bradd Canary, MD  [facility] Providers Cardiologist:  None Cardiology APP:  Kalman Jewels, NP  Electrophysiologist:  Lanier Prude, MD {  History of Present Illness: .   Tracy Miles is a 89 y.o. female w/PMHx of HTN, HLD, DVT hx, permanent Afib, CHB s/p AV node ablation, PPM, chronic CHF (diastolic), OSA, morbid obesity  She saw dr. Lalla Brothers 05/2022, doing well, improved exertional capacity s/p AVnode ablation and pacing, metoprolol stopped, planned for an annual visit   Today's visit is scheduled as an annual visit  ROS: ***  *** eliquis, dose, labs, bleeding *** rates *** volume   Device information Abbott single chamber PPM implanted 02/06/22  Arrhythmia/AAD hx AF is described as permanent S/p AV node ablation  Studies Reviewed: Marland Kitchen    EKG done today and reviewed by myself:  ***  DEVICE interrogation done today and reviewed by myself *** Battery and lead measurements are good ***   01/12/2022  Echo: IMPRESSIONS   1. Atrial fibrillation      . Left ventricular ejection fraction, by estimation, is 45 to 50%. The  left ventricle has mildly decreased function. Left ventricular endocardial  border not optimally defined to evaluate regional wall motion. There is  mild concentric left ventricular   hypertrophy. Left ventricular diastolic parameters are indeterminate.   2. Right ventricular systolic function is normal. The right ventricular  size is normal. There is normal pulmonary artery systolic pressure.   3. Left atrial size was moderately dilated.   4. The mitral valve is degenerative. Moderate mitral valve regurgitation.  No evidence of mitral stenosis. Moderate mitral annular calcification.   5. The aortic valve has an indeterminant number of cusps. Aortic valve  regurgitation is not visualized. Aortic valve sclerosis is present, with   no evidence of aortic valve stenosis.   6. The inferior vena cava is normal in size with greater than 50%  respiratory variability, suggesting right atrial pressure of 3 mmHg.     Risk Assessment/Calculations:    Physical Exam:   VS:  There were no vitals taken for this visit.   Wt Readings from Last 3 Encounters:  04/05/23 285 lb 3.2 oz (129.4 kg)  03/16/23 286 lb (129.7 kg)  12/01/22 287 lb 6.4 oz (130.4 kg)    GEN: Well nourished, well developed in no acute distress NECK: No JVD; No carotid bruits CARDIAC: ***RRR, no murmurs, rubs, gallops RESPIRATORY:  *** CTA b/l without rales, wheezing or rhonchi  ABDOMEN: Soft, non-tender, non-distended EXTREMITIES:  No edema; No deformity   PPM/ICD/ILR site: *** is stable, no thinning, fluctuation, tethering  ASSESSMENT AND PLAN: .    permanent AFib CHA2DS2Vasc is 7, on Eliquis, *** approprpiately dosed S/p AV node ablation ***  PPM *** intact function *** no programing changes made  Chronic CHF (diastolic) ***  HTN ***  Secondary hypercoagulable state 2/2 AFib     {Are you ordering a CV Procedure (e.g. stress test, cath, DCCV, TEE, etc)?   Press F2        :[id number]}     Dispo: ***  Signed, Sheilah Pigeon, PA-C

## 2023-05-11 NOTE — Telephone Encounter (Signed)
Pt would like to switch the pharmacy from [facility] to [facility].    amLODipine (NORVASC) 2.5 MG tablet   [facility] - [place], [place] - [address], [place] [address] Phone: [phone]  Fax: [phone]

## 2023-05-12 ENCOUNTER — Encounter: Payer: Self-pay | Admitting: Physician Assistant

## 2023-05-12 ENCOUNTER — Ambulatory Visit: Payer: Medicare Other | Attending: Physician Assistant | Admitting: Physician Assistant

## 2023-05-12 VITALS — BP 144/76 | HR 73 | Ht 63.0 in | Wt 286.0 lb

## 2023-05-12 DIAGNOSIS — I10 Essential (primary) hypertension: Secondary | ICD-10-CM | POA: Insufficient documentation

## 2023-05-12 DIAGNOSIS — I5032 Chronic diastolic (congestive) heart failure: Secondary | ICD-10-CM | POA: Diagnosis not present

## 2023-05-12 DIAGNOSIS — I4821 Permanent atrial fibrillation: Secondary | ICD-10-CM | POA: Insufficient documentation

## 2023-05-12 DIAGNOSIS — Z950 Presence of cardiac pacemaker: Secondary | ICD-10-CM | POA: Diagnosis not present

## 2023-05-12 DIAGNOSIS — D6869 Other thrombophilia: Secondary | ICD-10-CM | POA: Diagnosis not present

## 2023-05-12 LAB — CUP PACEART INCLINIC DEVICE CHECK
Battery Remaining Longevity: 120 mo
Battery Voltage: 3.02 V
Brady Statistic RV Percent Paced: 99.95 %
Date Time Interrogation Session: 20240911161235
Implantable Lead Connection Status: 753985
Implantable Lead Implant Date: 20230609
Implantable Lead Location: 753860
Implantable Pulse Generator Implant Date: 20230609
Lead Channel Impedance Value: 525 Ohm
Lead Channel Pacing Threshold Amplitude: 0.625 V
Lead Channel Pacing Threshold Pulse Width: 0.5 ms
Lead Channel Sensing Intrinsic Amplitude: 12 mV
Lead Channel Setting Pacing Amplitude: 0.875
Lead Channel Setting Pacing Pulse Width: 0.5 ms
Lead Channel Setting Sensing Sensitivity: 4 mV
Pulse Gen Model: 1272
Pulse Gen Serial Number: 8068284

## 2023-05-12 MED ORDER — POTASSIUM CHLORIDE ER 10 MEQ PO TBCR: EXTENDED_RELEASE_TABLET | ORAL | 3 refills | Status: DC

## 2023-05-12 NOTE — Patient Instructions (Signed)
Medication Instructions:   Your physician recommends that you continue on your current medications as directed. Please refer to the Current Medication list given to you today.   *If you need a refill on your cardiac medications before your next appointment, please call your pharmacy*   Lab Work: NONE ORDERED  TODAY    If you have labs (blood work) drawn today and your tests are completely normal, you will receive your results only by: MyChart Message (if you have MyChart) OR A paper copy in the mail If you have any lab test that is abnormal or we need to change your treatment, we will call you to review the results.   Testing/Procedures: NONE ORDERED  TODAY    Follow-Up:  At [facility], you and your health needs are our priority.  As part of our continuing mission to provide you with exceptional heart care, we have created designated Provider Care Teams.  These Care Teams include your primary Cardiologist (physician) and Advanced Practice Providers (APPs -  Physician Assistants and Nurse Practitioners) who all work together to provide you with the care you need, when you need it.  We recommend signing up for the patient portal called "MyChart".  Sign up information is provided on this After Visit Summary.  MyChart is used to connect with patients for Virtual Visits (Telemedicine).  Patients are able to view lab/test results, encounter notes, upcoming appointments, etc.  Non-urgent messages can be sent to your provider as well.   To learn more about what you can do with MyChart, go to [email or web address].    Your next appointment:    1 year(s)  Provider:    You may see Lanier Prude, MD or one of the following Advanced Practice Providers on your designated Care Team:   [name], [place]  Other Instructions

## 2023-05-18 ENCOUNTER — Other Ambulatory Visit (HOSPITAL_BASED_OUTPATIENT_CLINIC_OR_DEPARTMENT_OTHER): Payer: Self-pay

## 2023-05-18 DIAGNOSIS — Z23 Encounter for immunization: Secondary | ICD-10-CM | POA: Diagnosis not present

## 2023-05-18 MED ORDER — INFLUENZA VAC A&B SURF ANT ADJ 0.5 ML IM SUSY
0.5000 mL | PREFILLED_SYRINGE | Freq: Once | INTRAMUSCULAR | 0 refills | Status: AC
  Filled 2023-05-18: qty 0.5, 1d supply, fill #0

## 2023-05-18 MED ORDER — COVID-19 MRNA VAC-TRIS(PFIZER) 30 MCG/0.3ML IM SUSY
0.3000 mL | PREFILLED_SYRINGE | Freq: Once | INTRAMUSCULAR | 0 refills | Status: AC
  Filled 2023-05-18: qty 0.3, 1d supply, fill #0

## 2023-05-28 NOTE — Progress Notes (Signed)
Remote pacemaker transmission.   

## 2023-06-14 ENCOUNTER — Other Ambulatory Visit: Payer: Self-pay | Admitting: Family Medicine

## 2023-06-28 ENCOUNTER — Other Ambulatory Visit: Payer: Self-pay | Admitting: Family Medicine

## 2023-06-30 ENCOUNTER — Ambulatory Visit: Payer: Medicare Other | Admitting: *Deleted

## 2023-06-30 VITALS — BP 139/67 | HR 70 | Ht 63.0 in | Wt 288.2 lb

## 2023-06-30 DIAGNOSIS — Z0000 Encounter for general adult medical examination without abnormal findings: Secondary | ICD-10-CM | POA: Diagnosis not present

## 2023-06-30 NOTE — Patient Instructions (Signed)
Ms. Slupski , Thank you for taking time to come for your Medicare Wellness Visit. I appreciate your ongoing commitment to your health goals. Please review the following plan we discussed and let me know if I can assist you in the future.      This is a list of the screening recommended for you and due dates:  Health Maintenance  Topic Date Due   COVID-19 Vaccine (9 - 2023-24 season) 07/13/2023   Medicare Annual Wellness Visit  06/29/2024   DTaP/Tdap/Td vaccine (3 - Td or Tdap) 09/17/2032   Pneumonia Vaccine  Completed   Flu Shot  Completed   DEXA scan (bone density measurement)  Completed   Zoster (Shingles) Vaccine  Completed   HPV Vaccine  Aged Out   Colon Cancer Screening  Discontinued     Next appointment: Follow up in one year for your annual wellness visit.   Preventive Care 87 Years and Older, Female Preventive care refers to lifestyle choices and visits with your health care provider that can promote health and wellness. What does preventive care include? A yearly physical exam. This is also called an annual well check. Dental exams once or twice a year. Routine eye exams. Ask your health care provider how often you should have your eyes checked. Personal lifestyle choices, including: Daily care of your teeth and gums. Regular physical activity. Eating a healthy diet. Avoiding tobacco and drug use. Limiting alcohol use. Practicing safe sex. Taking low-dose aspirin every day. Taking vitamin and mineral supplements as recommended by your health care provider. What happens during an annual well check? The services and screenings done by your health care provider during your annual well check will depend on your age, overall health, lifestyle risk factors, and family history of disease. Counseling  Your health care provider may ask you questions about your: Alcohol use. Tobacco use. Drug use. Emotional well-being. Home and relationship well-being. Sexual  activity. Eating habits. History of falls. Memory and ability to understand (cognition). Work and work Astronomer. Reproductive health. Screening  You may have the following tests or measurements: Height, weight, and BMI. Blood pressure. Lipid and cholesterol levels. These may be checked every 5 years, or more frequently if you are over 45 years old. Skin check. Lung cancer screening. You may have this screening every year starting at age 67 if you have a 30-pack-year history of smoking and currently smoke or have quit within the past 15 years. Fecal occult blood test (FOBT) of the stool. You may have this test every year starting at age 60. Flexible sigmoidoscopy or colonoscopy. You may have a sigmoidoscopy every 5 years or a colonoscopy every 10 years starting at age 69. Hepatitis C blood test. Hepatitis B blood test. Sexually transmitted disease (STD) testing. Diabetes screening. This is done by checking your blood sugar (glucose) after you have not eaten for a while (fasting). You may have this done every 1-3 years. Bone density scan. This is done to screen for osteoporosis. You may have this done starting at age 71. Mammogram. This may be done every 1-2 years. Talk to your health care provider about how often you should have regular mammograms. Talk with your health care provider about your test results, treatment options, and if necessary, the need for more tests. Vaccines  Your health care provider may recommend certain vaccines, such as: Influenza vaccine. This is recommended every year. Tetanus, diphtheria, and acellular pertussis (Tdap, Td) vaccine. You may need a Td booster every 10 years. Zoster  vaccine. You may need this after age 61. Pneumococcal 13-valent conjugate (PCV13) vaccine. One dose is recommended after age 39. Pneumococcal polysaccharide (PPSV23) vaccine. One dose is recommended after age 43. Talk to your health care provider about which screenings and vaccines  you need and how often you need them. This information is not intended to replace advice given to you by your health care provider. Make sure you discuss any questions you have with your health care provider. Document Released: 09/13/2015 Document Revised: 05/06/2016 Document Reviewed: 06/18/2015 Elsevier Interactive Patient Education  2017 [employer].  Fall Prevention in the Home Falls can cause injuries. They can happen to people of all ages. There are many things you can do to make your home safe and to help prevent falls. What can I do on the outside of my home? Regularly fix the edges of walkways and driveways and fix any cracks. Remove anything that might make you trip as you walk through a door, such as a raised step or threshold. Trim any bushes or trees on the path to your home. Use bright outdoor lighting. Clear any walking paths of anything that might make someone trip, such as rocks or tools. Regularly check to see if handrails are loose or broken. Make sure that both sides of any steps have handrails. Any raised decks and porches should have guardrails on the edges. Have any leaves, snow, or ice cleared regularly. Use sand or salt on walking paths during winter. Clean up any spills in your garage right away. This includes oil or grease spills. What can I do in the bathroom? Use night lights. Install grab bars by the toilet and in the tub and shower. Do not use towel bars as grab bars. Use non-skid mats or decals in the tub or shower. If you need to sit down in the shower, use a plastic, non-slip stool. Keep the floor dry. Clean up any water that spills on the floor as soon as it happens. Remove soap buildup in the tub or shower regularly. Attach bath mats securely with double-sided non-slip rug tape. Do not have throw rugs and other things on the floor that can make you trip. What can I do in the bedroom? Use night lights. Make sure that you have a light by your bed that  is easy to reach. Do not use any sheets or blankets that are too big for your bed. They should not hang down onto the floor. Have a firm chair that has side arms. You can use this for support while you get dressed. Do not have throw rugs and other things on the floor that can make you trip. What can I do in the kitchen? Clean up any spills right away. Avoid walking on wet floors. Keep items that you use a lot in easy-to-reach places. If you need to reach something above you, use a strong step stool that has a grab bar. Keep electrical cords out of the way. Do not use floor polish or wax that makes floors slippery. If you must use wax, use non-skid floor wax. Do not have throw rugs and other things on the floor that can make you trip. What can I do with my stairs? Do not leave any items on the stairs. Make sure that there are handrails on both sides of the stairs and use them. Fix handrails that are broken or loose. Make sure that handrails are as long as the stairways. Check any carpeting to make sure that it  is firmly attached to the stairs. Fix any carpet that is loose or worn. Avoid having throw rugs at the top or bottom of the stairs. If you do have throw rugs, attach them to the floor with carpet tape. Make sure that you have a light switch at the top of the stairs and the bottom of the stairs. If you do not have them, ask someone to add them for you. What else can I do to help prevent falls? Wear shoes that: Do not have high heels. Have rubber bottoms. Are comfortable and fit you well. Are closed at the toe. Do not wear sandals. If you use a stepladder: Make sure that it is fully opened. Do not climb a closed stepladder. Make sure that both sides of the stepladder are locked into place. Ask someone to hold it for you, if possible. Clearly mark and make sure that you can see: Any grab bars or handrails. First and last steps. Where the edge of each step is. Use tools that help you  move around (mobility aids) if they are needed. These include: Canes. Walkers. Scooters. Crutches. Turn on the lights when you go into a dark area. Replace any light bulbs as soon as they burn out. Set up your furniture so you have a clear path. Avoid moving your furniture around. If any of your floors are uneven, fix them. If there are any pets around you, be aware of where they are. Review your medicines with your doctor. Some medicines can make you feel dizzy. This can increase your chance of falling. Ask your doctor what other things that you can do to help prevent falls. This information is not intended to replace advice given to you by your health care provider. Make sure you discuss any questions you have with your health care provider. Document Released: 06/13/2009 Document Revised: 01/23/2016 Document Reviewed: 09/21/2014 Elsevier Interactive Patient Education  2017 [employer].

## 2023-06-30 NOTE — Progress Notes (Signed)
Subjective:   Tracy Miles is a 89 y.o. female who presents for Medicare Annual (Subsequent) preventive examination.  Visit Complete: In person  Patient Medicare AWV questionnaire was completed by the patient on 06/23/23; I have confirmed that all information answered by patient is correct and no changes since this date.  Cardiac Risk Factors include: advanced age (>20men, >4 women);dyslipidemia;hypertension;obesity (BMI >30kg/m2)     Objective:    Today's Vitals   06/23/23 1024 06/30/23 1336 06/30/23 1402  BP:  (!) 145/66 139/67  Pulse:  72 70  SpO2:  96%   Weight:  288 lb 3.2 oz (130.7 kg)   Height:  5\' 3"  (1.6 m)   PainSc: 5      Body mass index is 51.05 kg/m.     06/30/2023    1:52 PM 06/25/2022    3:40 PM 03/23/2022    2:54 PM 02/08/2022    8:37 AM 02/06/2022   11:56 AM 06/19/2021    3:43 PM 06/07/2019   11:07 AM  Advanced Directives  Does Patient Have a Medical Advance Directive? Yes Yes Yes Yes Yes Yes Yes  Type of Advance Directive Living will Healthcare Power of [place];Living will Living will   Healthcare Power of [place];Living will Healthcare Power of [place];Living will  Does patient want to make changes to medical advance directive? No - Patient declined No - Patient declined       Copy of Healthcare Power of Attorney in Chart?  Yes - validated most recent copy scanned in chart (See row information)    Yes - validated most recent copy scanned in chart (See row information) Yes - validated most recent copy scanned in chart (See row information)    Current Medications (verified) Outpatient Encounter Medications as of 06/30/2023  Medication Sig   acetaminophen (TYLENOL) 325 MG tablet Take 325 mg by mouth every morning.   amLODipine (NORVASC) 2.5 MG tablet Take 1 tablet (2.5 mg total) by mouth daily.   atorvastatin (LIPITOR) 20 MG tablet Take 1 tablet (20 mg total) by mouth daily.   bumetanide (BUMEX) 1 MG tablet TAKE 1 TABLET BY MOUTH DAILY   Calcium  Carb-Cholecalciferol (CALCIUM 600 + D PO) Take 1 tablet by mouth 2 (two) times daily.   Carboxymethylcellul-Glycerin (LUBRICATING EYE DROPS OP) Place 1 drop into both eyes in the morning and at bedtime.   clindamycin (CLEOCIN) 300 MG capsule Take 1,200 mg by mouth See admin instructions. Before dental work   ELIQUIS 5 MG TABS tablet TAKE ONE TABLET EVERY MORNING AND AT BEDTIME   lisinopril (ZESTRIL) 10 MG tablet TAKE ONE (1) TABLET BY MOUTH EVERY DAY   loratadine (CLARITIN) 10 MG tablet Take 10 mg by mouth daily.    Multiple Vitamins-Minerals (ONE-A-DAY WOMENS 50 PLUS PO) Take 1 tablet by mouth daily.   potassium chloride (KLOR-CON) 10 MEQ tablet TAKE 1 TABLET BY MOUTH IN  THE MORNING AND 2 TABLETS  BY MOUTH IN THE EVENING   Probiotic CAPS Take 1 capsule by mouth daily. Gummy   psyllium (REGULOID) 0.52 g capsule Take 0.52 g by mouth 2 (two) times daily.    No facility-administered encounter medications on file as of 06/30/2023.    Allergies (verified) Gabapentin, Lactose intolerance (gi), Penicillins, Sulfa antibiotics, Vancomycin, and Zebeta [bisoprolol fumarate]   History: Past Medical History:  Diagnosis Date   Allergy    Anemia    Aortic stenosis    Very mild   Benign paroxysmal positional vertigo 12/17/2013   Chicken pox  as a child   Colon polyp    Coronary artery disease    DDD (degenerative disc disease) 11/16/2013   Hyperlipidemia    Hypertension    Iron deficiency anemia 11/13/2013   Mumps child and teenager   OA (osteoarthritis) 11/19/2013   S/p L TKR   Obesity    OSA on CPAP    Pancreatitis    post hysterectomy   Persistent atrial fibrillation (HCC)    Personal history of colonic polyps 02/25/2014   Personal history of DVT (deep vein thrombosis) X 2   "left"   Sleep apnea    Spinal stenosis    Past Surgical History:  Procedure Laterality Date   APPENDECTOMY     ATRIAL FIBRILLATION ABLATION N/A 04/25/2019   Procedure: ATRIAL FIBRILLATION ABLATION;  Surgeon:  Hillis Range, MD;  Location: [facility];  Service: Cardiovascular;  Laterality: N/A;   AV NODE ABLATION N/A 03/23/2022   Procedure: AV NODE ABLATION;  Surgeon: Lanier Prude, MD;  Location: [facility];  Service: Cardiovascular;  Laterality: N/A;   CARDIOVERSION N/A 08/09/2015   Procedure: CARDIOVERSION;  Surgeon: Jake Bathe, MD;  Location: [facility] ENDOSCOPY;  Service: Cardiovascular;  Laterality: N/A;   CARDIOVERSION N/A 09/30/2018   Procedure: CARDIOVERSION;  Surgeon: Lars Masson, MD;  Location: [facility] ENDOSCOPY;  Service: Cardiovascular;  Laterality: N/A;   CARDIOVERSION N/A 02/27/2019   Procedure: CARDIOVERSION;  Surgeon: Jodelle Red, MD;  Location: [facility] ENDOSCOPY;  Service: Cardiovascular;  Laterality: N/A;   CARDIOVERSION N/A 06/07/2019   Procedure: CARDIOVERSION;  Surgeon: Lars Masson, MD;  Location: [facility] ENDOSCOPY;  Service: Cardiovascular;  Laterality: N/A;   CATARACT EXTRACTION W/ INTRAOCULAR LENS  IMPLANT, BILATERAL Bilateral 2016   CYST REMOVAL HAND Bilateral 1990's   "played to much golf"   DILATION AND CURETTAGE OF UTERUS     EYE SURGERY     implantable loop recorder placement  11/20/2019   Medtronic Reveal Linq model LNQ 22 (model number [id number]) implanted in office by Dr Johney Frame for afib management   JOINT REPLACEMENT     LUMBAR LAMINECTOMY  40 yrs ago   "L3-4"   MENISCUS REPAIR Bilateral 2000 and 2010   PACEMAKER IMPLANT N/A 02/06/2022   Procedure: PACEMAKER IMPLANT;  Surgeon: Lanier Prude, MD;  Location: [facility];  Service: Cardiovascular;  Laterality: N/A;   PACEMAKER INSERTION     REPLACEMENT TOTAL KNEE Left 2013   SPINE SURGERY     TEE WITHOUT CARDIOVERSION N/A 04/24/2019   Procedure: TRANSESOPHAGEAL ECHOCARDIOGRAM (TEE);  Surgeon: Elease Hashimoto Deloris Ping, MD;  Location[facility] ENDOSCOPY;  Service: Cardiovascular;  Laterality: N/A;   TONSILECTOMY, ADENOIDECTOMY, BILATERAL MYRINGOTOMY AND TUBES  child   TOTAL ABDOMINAL  HYSTERECTOMY  1995   had 2 tumors- benign   TUBAL LIGATION  89 years old   Family History  Problem Relation Age of Onset   Heart attack Mother 30   Hyperlipidemia Mother        ?   Dementia Mother    Pernicious anemia Mother    Heart disease Mother    COPD Father        smoker   Cancer Father 35       prostate   Heart disease Brother        quadruple bipass surgery   Hyperlipidemia Brother    Hypertension Brother    Diabetes Maternal Grandmother        type 2   Pernicious anemia Maternal Grandmother  Gout Son    Atrial fibrillation Son    Hyperlipidemia Son    Cancer Maternal Grandfather        liver   Cancer Paternal Grandmother        lung- doesn't think she smokes?   Stroke Paternal Grandfather    Cancer Son 31       non hodgin's lymphoma   Gout Son    Hyperlipidemia Son    Sleep apnea Son    Heart disease Son    Colon cancer Neg Hx    Pancreatic cancer Neg Hx    Stomach cancer Neg Hx    Throat cancer Neg Hx    Liver disease Neg Hx    Social History   Socioeconomic History   Marital status: Widowed    Spouse name: Not on file   Number of children: 3   Years of education: Not on file   Highest education level: Bachelor's degree (e.g., BA, AB, BS)  Occupational History   Occupation: retired Runner, broadcasting/film/video  Tobacco Use   Smoking status: Never   Smokeless tobacco: Never   Tobacco comments:    never used tobacco  Substance and Sexual Activity   Alcohol use: No   Drug use: No   Sexual activity: Never    Comment: lives at [place], low sodium diet  Other Topics Concern   Not on file  Social History Narrative   Not on file   Social Determinants of Health   Financial Resource Strain: Low Risk  (06/23/2023)   Overall Financial Resource Strain (CARDIA)    Difficulty of Paying Living Expenses: Not hard at all  Food Insecurity: No Food Insecurity (06/23/2023)   Hunger Vital Sign    Worried About Running Out of Food in the Last Year: Never true    Ran  Out of Food in the Last Year: Never true  Transportation Needs: No Transportation Needs (06/23/2023)   PRAPARE - Administrator, Civil Service (Medical): No    Lack of Transportation (Non-Medical): No  Physical Activity: Sufficiently Active (06/23/2023)   Exercise Vital Sign    Days of Exercise per Week: 3 days    Minutes of Exercise per Session: 60 min  Stress: No Stress Concern Present (06/23/2023)   [employer] of Occupational Health - Occupational Stress Questionnaire    Feeling of Stress : Only a little  Social Connections: Moderately Integrated (06/23/2023)   Social Connection and Isolation Panel [NHANES]    Frequency of Communication with Friends and Family: Three times a week    Frequency of Social Gatherings with Friends and Family: Three times a week    Attends Religious Services: More than 4 times per year    Active Member of Clubs or Organizations: Yes    Attends Banker Meetings: More than 4 times per year    Marital Status: Widowed    Tobacco Counseling Counseling given: Not Answered Tobacco comments: never used tobacco   Clinical Intake:  Pre-visit preparation completed: Yes  Pain : 0-10 Pain Score: 5  Pain Type: Chronic pain Pain Location: Back Pain Orientation: Lower Pain Descriptors / Indicators: Constant, Aching Pain Onset: More than a month ago  BMI - recorded: 51.05 Nutritional Status: BMI > 30  Obese Nutritional Risks: None Diabetes: No  How often do you need to have someone help you when you read instructions, pamphlets, or other written materials from your doctor or pharmacy?: 1 - Never  Interpreter Needed?: No  Information entered  by :: Donne Anon, CMA   Activities of Daily Living    06/23/2023   10:24 AM  In your present state of health, do you have any difficulty performing the following activities:  Hearing? 0  Vision? 0  Difficulty concentrating or making decisions? 0  Walking or climbing stairs? 1   Dressing or bathing? 0  Doing errands, shopping? 0  Preparing Food and eating ? N  Using the Toilet? N  In the past six months, have you accidently leaked urine? N  Do you have problems with loss of bowel control? N  Managing your Medications? N  Managing your Finances? N  Housekeeping or managing your Housekeeping? N    Patient Care Team: Bradd Canary, MD as PCP - General (Family Medicine) Lanier Prude, MD as PCP - Electrophysiology (Cardiology) Kalman Jewels, NP as Nurse Practitioner (Cardiology) Oretha Milch, MD as Consulting Physician (Pulmonary Disease) Marcene Corning, MD as Consulting Physician (Orthopedic Surgery) Erroll Luna, [facility] (Inactive) (Pharmacist)  Indicate any recent Medical Services you may have received from other than [facility] providers in the past year (date may be approximate).     Assessment:   This is a routine wellness examination for [place].  Hearing/Vision screen No results found.   Goals Addressed   None    Depression Screen    06/30/2023    1:55 PM 04/05/2023    1:14 PM 03/16/2023   11:19 AM 12/01/2022   11:00 AM 09/08/2022    2:10 PM 06/25/2022    3:43 PM 05/05/2022    2:36 PM  PHQ 2/9 Scores  PHQ - 2 Score 0 0 0 2 0 0 0  PHQ- 9 Score  0 0 3   0    Fall Risk    06/23/2023   10:24 AM 04/05/2023    1:13 PM 03/16/2023   11:19 AM 12/01/2022   11:00 AM 09/08/2022    2:09 PM  Fall Risk   Falls in the past year? 0 0 0 0 0  Number falls in past yr: 0 0 0 0 0  Injury with Fall? 0 0 0 0 0  Risk for fall due to : No Fall Risks  No Fall Risks    Follow up Falls evaluation completed Falls evaluation completed Falls evaluation completed Falls evaluation completed Falls evaluation completed    MEDICARE RISK AT HOME: Medicare Risk at Home Any stairs in or around the home?: No If so, are there any without handrails?: No Home free of loose throw rugs in walkways, pet beds, electrical cords, etc?: Yes Adequate lighting in your home to  reduce risk of falls?: Yes Life alert?: No Use of a cane, walker or w/c?: Yes Grab bars in the bathroom?: Yes Shower chair or bench in shower?: No Elevated toilet seat or a handicapped toilet?: Yes  TIMED UP AND GO:  Was the test performed?  Yes  Length of time to ambulate 10 feet: 9 sec Gait slow and steady with assistive device    Cognitive Function:    04/21/2018    1:31 PM 09/29/2016    2:15 PM  MMSE - Mini Mental State Exam  Orientation to time 5 5  Orientation to Place 5 5  Registration 3 3  Attention/ Calculation 5 5  Recall 2 3  Language- name 2 objects 2 2  Language- repeat 1 1  Language- follow 3 step command 3 3  Language- read & follow direction 1 1  Write a  sentence 1 1  Copy design 1 1  Total score 29 30        06/30/2023    2:00 PM 06/25/2022    4:00 PM  6CIT Screen  What Year? 0 points 0 points  What month? 0 points 0 points  What time? 0 points 0 points  Count back from 20 0 points 2 points  Months in reverse 0 points 0 points  Repeat phrase 0 points 2 points  Total Score 0 points 4 points    Immunizations Immunization History  Administered Date(s) Administered   Fluad Quad(high Dose 65+) 06/16/2019, 05/19/2021, 05/22/2022   Fluad Trivalent(High Dose 65+) 05/18/2023   Influenza, High Dose Seasonal PF 05/11/2017, 05/24/2018   Influenza,inj,Quad PF,6+ Mos 04/30/2016   Influenza-Unspecified 05/31/2013, 05/01/2014, 06/11/2015   Moderna Covid-19 Vaccine Bivalent Booster 31yrs & up 05/26/2021, 01/20/2022   Moderna Sars-Covid-2 Vaccination 09/13/2019, 10/10/2019, 07/03/2020, 12/18/2020   Pfizer Covid-19 Vaccine Bivalent Booster 69yrs & up 06/12/2021   Pfizer(Comirnaty)Fall Seasonal Vaccine 12 years and older 07/14/2022, 05/18/2023   Pneumococcal Conjugate-13 04/02/2014   Pneumococcal Polysaccharide-23 08/31/1998, 09/06/2015   Respiratory Syncytial Virus Vaccine,Recomb Aduvanted(Arexvy) 05/18/2022   Tdap 07/31/2012, 09/17/2022   Zoster  Recombinant(Shingrix) 04/19/2018, 06/27/2018    TDAP status: Up to date  Flu Vaccine status: Up to date  Pneumococcal vaccine status: Up to date  Covid-19 vaccine status: Completed vaccines  Qualifies for Shingles Vaccine? Yes   Zostavax completed No   Shingrix Completed?: Yes  Screening Tests Health Maintenance  Topic Date Due   Medicare Annual Wellness (AWV)  06/26/2023   COVID-19 Vaccine (9 - 2023-24 season) 07/13/2023   DTaP/Tdap/Td (3 - Td or Tdap) 09/17/2032   Pneumonia Vaccine 45+ Years old  Completed   INFLUENZA VACCINE  Completed   DEXA SCAN  Completed   Zoster Vaccines- Shingrix  Completed   HPV VACCINES  Aged Out   Colonoscopy  Discontinued    Health Maintenance  Health Maintenance Due  Topic Date Due   Medicare Annual Wellness (AWV)  06/26/2023    Colorectal cancer screening: No longer required.   Mammogram status: Completed 11/12/22. Repeat every year  Bone Density status: pt declined  Lung Cancer Screening: (Low Dose CT Chest recommended if Age 36-80 years, 20 pack-year currently smoking OR have quit w/in 15years.) does not qualify.   Additional Screening:  Hepatitis C Screening: does not qualify  Vision Screening: Recommended annual ophthalmology exams for early detection of glaucoma and other disorders of the eye. Is the patient up to date with their annual eye exam?  Yes  Who is the provider or what is the name of the office in which the patient attends annual eye exams? [facility]. If pt is not established with a provider, would they like to be referred to a provider to establish care? No .   Dental Screening: Recommended annual dental exams for proper oral hygiene  Diabetic Foot Exam: N/a  Community Resource Referral / Chronic Care Management: CRR required this visit?  No   CCM required this visit?  No     Plan:     I have personally reviewed and noted the following in the patient's chart:   Medical and social history Use of  alcohol, tobacco or illicit drugs  Current medications and supplements including opioid prescriptions. Patient is not currently taking opioid prescriptions. Functional ability and status Nutritional status Physical activity Advanced directives List of other physicians Hospitalizations, surgeries, and ER visits in previous 12 months Vitals Screenings to  include cognitive, depression, and falls Referrals and appointments  In addition, I have reviewed and discussed with patient certain preventive protocols, quality metrics, and best practice recommendations. A written personalized care plan for preventive services as well as general preventive health recommendations were provided to patient.     Donne Anon, CMA   06/30/2023   After Visit Summary: pt declined  Nurse Notes: None

## 2023-08-10 ENCOUNTER — Ambulatory Visit (INDEPENDENT_AMBULATORY_CARE_PROVIDER_SITE_OTHER): Payer: Medicare Other

## 2023-08-10 DIAGNOSIS — I4821 Permanent atrial fibrillation: Secondary | ICD-10-CM

## 2023-08-11 LAB — CUP PACEART REMOTE DEVICE CHECK
Battery Remaining Longevity: 120 mo
Battery Remaining Percentage: 91 %
Battery Voltage: 3.02 V
Brady Statistic RV Percent Paced: 99 %
Date Time Interrogation Session: 20241210020013
Implantable Lead Connection Status: 753985
Implantable Lead Implant Date: 20230609
Implantable Lead Location: 753860
Implantable Pulse Generator Implant Date: 20230609
Lead Channel Impedance Value: 480 Ohm
Lead Channel Pacing Threshold Amplitude: 0.625 V
Lead Channel Pacing Threshold Pulse Width: 0.5 ms
Lead Channel Sensing Intrinsic Amplitude: 12 mV
Lead Channel Setting Pacing Amplitude: 0.875
Lead Channel Setting Pacing Pulse Width: 0.5 ms
Lead Channel Setting Sensing Sensitivity: 4 mV
Pulse Gen Model: 1272
Pulse Gen Serial Number: 8068284

## 2023-08-16 NOTE — Assessment & Plan Note (Signed)
Rate controlled, tolerating Eliquis 

## 2023-08-16 NOTE — Assessment & Plan Note (Signed)
No recent exacerbation following with cardiology

## 2023-08-16 NOTE — Assessment & Plan Note (Signed)
hgba1c acceptable, minimize simple carbs. Increase exercise as tolerated.  

## 2023-08-16 NOTE — Assessment & Plan Note (Signed)
Well controlled, no changes to meds. Encouraged heart healthy diet such as the DASH diet and exercise as tolerated.  °

## 2023-08-16 NOTE — Assessment & Plan Note (Signed)
Tolerating statin, encouraged heart healthy diet, avoid trans fats, minimize simple carbs and saturated fats. Increase exercise as tolerated, have agreed to take over her Atorvastatin when it is time for refill

## 2023-08-16 NOTE — Assessment & Plan Note (Signed)
No new symptoms.

## 2023-08-17 ENCOUNTER — Ambulatory Visit (INDEPENDENT_AMBULATORY_CARE_PROVIDER_SITE_OTHER): Payer: Medicare Other | Admitting: Family Medicine

## 2023-08-17 ENCOUNTER — Encounter: Payer: Self-pay | Admitting: Family Medicine

## 2023-08-17 VITALS — BP 138/86 | HR 70 | Temp 98.1°F | Resp 18 | Ht 63.0 in | Wt 285.3 lb

## 2023-08-17 DIAGNOSIS — E782 Mixed hyperlipidemia: Secondary | ICD-10-CM

## 2023-08-17 DIAGNOSIS — J8410 Pulmonary fibrosis, unspecified: Secondary | ICD-10-CM | POA: Diagnosis not present

## 2023-08-17 DIAGNOSIS — I10 Essential (primary) hypertension: Secondary | ICD-10-CM | POA: Diagnosis not present

## 2023-08-17 DIAGNOSIS — I4821 Permanent atrial fibrillation: Secondary | ICD-10-CM | POA: Diagnosis not present

## 2023-08-17 DIAGNOSIS — I5032 Chronic diastolic (congestive) heart failure: Secondary | ICD-10-CM

## 2023-08-17 DIAGNOSIS — R739 Hyperglycemia, unspecified: Secondary | ICD-10-CM | POA: Diagnosis not present

## 2023-08-17 MED ORDER — TRAMADOL HCL 50 MG PO TABS: 50.0000 mg | ORAL_TABLET | Freq: Three times a day (TID) | ORAL | 0 refills | Status: AC | PRN

## 2023-08-17 MED ORDER — AMLODIPINE BESYLATE 2.5 MG PO TABS: 2.5000 mg | ORAL_TABLET | Freq: Every day | ORAL | 1 refills | Status: DC

## 2023-08-17 NOTE — Progress Notes (Signed)
Subjective:    Patient ID: Tracy Miles, female    DOB: 1934/07/01, 89 y.o.   MRN: [id number]  Chief Complaint  Patient presents with  . Follow-up    4 month    HPI Discussed the use of AI scribe software for clinical note transcription with the patient, who gave verbal consent to proceed.  History of Present Illness   The patient, with a history of spinal stenosis and knee problems, presents with increasing difficulty in mobility. They report that standing for extended periods exacerbates the pain, leading to weakness and a slower pace. The patient is currently managing the pain with Tylenol, taking no more than six a day. Despite this, the pain remains an issue, although it does not disrupt their sleep. The patient has taken measures to prevent falls by using a walker at night for added security.  In addition to their mobility issues, the patient is also managing high blood pressure with amlodipine and lisinopril. They report being compliant with their medication regimen. The patient's blood pressure is still not fully controlled, and they are open to adjustments in their medication to improve control.        Past Medical History:  Diagnosis Date  . Allergy   . Anemia   . Aortic stenosis    Very mild  . Benign paroxysmal positional vertigo 12/17/2013  . Chicken pox as a child  . Colon polyp   . Coronary artery disease   . DDD (degenerative disc disease) 11/16/2013  . Hyperlipidemia   . Hypertension   . Iron deficiency anemia 11/13/2013  . Mumps child and teenager  . OA (osteoarthritis) 11/19/2013   S/p L TKR  . Obesity   . OSA on CPAP   . Pancreatitis    post hysterectomy  . Persistent atrial fibrillation (HCC)   . Personal history of colonic polyps 02/25/2014  . Personal history of DVT (deep vein thrombosis) X 2   "left"  . Sleep apnea   . Spinal stenosis     Past Surgical History:  Procedure Laterality Date  . APPENDECTOMY    . ATRIAL FIBRILLATION  ABLATION N/A 04/25/2019   Procedure: ATRIAL FIBRILLATION ABLATION;  Surgeon: Hillis Range, MD;  Location: [facility] INVASIVE CV LAB;  Service: Cardiovascular;  Laterality: N/A;  . AV NODE ABLATION N/A 03/23/2022   Procedure: AV NODE ABLATION;  Surgeon: Lanier Prude, MD;  Location: [facility] INVASIVE CV LAB;  Service: Cardiovascular;  Laterality: N/A;  . CARDIOVERSION N/A 08/09/2015   Procedure: CARDIOVERSION;  Surgeon: Jake Bathe, MD;  Location: [facility] ENDOSCOPY;  Service: Cardiovascular;  Laterality: N/A;  . CARDIOVERSION N/A 09/30/2018   Procedure: CARDIOVERSION;  Surgeon: Lars Masson, MD;  Location: [facility] ENDOSCOPY;  Service: Cardiovascular;  Laterality: N/A;  . CARDIOVERSION N/A 02/27/2019   Procedure: CARDIOVERSION;  Surgeon: Jodelle Red, MD;  Location: [facility] ENDOSCOPY;  Service: Cardiovascular;  Laterality: N/A;  . CARDIOVERSION N/A 06/07/2019   Procedure: CARDIOVERSION;  Surgeon: Lars Masson, MD;  Location: [facility] ENDOSCOPY;  Service: Cardiovascular;  Laterality: N/A;  . CATARACT EXTRACTION W/ INTRAOCULAR LENS  IMPLANT, BILATERAL Bilateral 2016  . CYST REMOVAL HAND Bilateral 1990's   "played to much golf"  . DILATION AND CURETTAGE OF UTERUS    . EYE SURGERY    . implantable loop recorder placement  11/20/2019   Medtronic Reveal Linq model LNQ 22 (model number [id number]) implanted in office by Dr Johney Frame for afib management  . JOINT REPLACEMENT    . LUMBAR LAMINECTOMY  40 yrs ago   "L3-4"  . MENISCUS REPAIR Bilateral 2000 and 2010  . PACEMAKER IMPLANT N/A 02/06/2022   Procedure: PACEMAKER IMPLANT;  Surgeon: Lanier Prude, MD;  Location: [facility] INVASIVE CV LAB;  Service: Cardiovascular;  Laterality: N/A;  . PACEMAKER INSERTION    . REPLACEMENT TOTAL KNEE Left 2013  . SPINE SURGERY    . TEE WITHOUT CARDIOVERSION N/A 04/24/2019   Procedure: TRANSESOPHAGEAL ECHOCARDIOGRAM (TEE);  Surgeon: Elease Hashimoto Deloris Ping, MD;  Location: [facility] ENDOSCOPY;  Service: Cardiovascular;  Laterality: N/A;   . TONSILECTOMY, ADENOIDECTOMY, BILATERAL MYRINGOTOMY AND TUBES  child  . TOTAL ABDOMINAL HYSTERECTOMY  1995   had 2 tumors- benign  . TUBAL LIGATION  89 years old    Family History  Problem Relation Age of Onset  . Heart attack Mother 52  . Hyperlipidemia Mother        ?  Marland Kitchen Dementia Mother   . Pernicious anemia Mother   . Heart disease Mother   . COPD Father        smoker  . Cancer Father 58       prostate  . Heart disease Brother        quadruple bipass surgery  . Hyperlipidemia Brother   . Hypertension Brother   . Diabetes Maternal Grandmother        type 2  . Pernicious anemia Maternal Grandmother   . Gout Son   . Atrial fibrillation Son   . Hyperlipidemia Son   . Cancer Maternal Grandfather        liver  . Cancer Paternal Grandmother        lung- doesn't think she smokes?  . Stroke Paternal Grandfather   . Cancer Son 50       non hodgin's lymphoma  . Gout Son   . Hyperlipidemia Son   . Sleep apnea Son   . Heart disease Son   . Colon cancer Neg Hx   . Pancreatic cancer Neg Hx   . Stomach cancer Neg Hx   . Throat cancer Neg Hx   . Liver disease Neg Hx     Social History   Socioeconomic History  . Marital status: Widowed    Spouse name: Not on file  . Number of children: 3  . Years of education: Not on file  . Highest education level: Bachelor's degree (e.g., BA, AB, BS)  Occupational History  . Occupation: retired Runner, broadcasting/film/video  Tobacco Use  . Smoking status: Never  . Smokeless tobacco: Never  . Tobacco comments:    never used tobacco  Substance and Sexual Activity  . Alcohol use: No  . Drug use: No  . Sexual activity: Never    Comment: lives at [place], low sodium diet  Other Topics Concern  . Not on file  Social History Narrative  . Not on file   Social Drivers of Health   Financial Resource Strain: Low Risk  (08/10/2023)   Overall Financial Resource Strain (CARDIA)   . Difficulty of Paying Living Expenses: Not hard at all  Food  Insecurity: No Food Insecurity (08/10/2023)   Hunger Vital Sign   . Worried About Programme researcher, broadcasting/film/video in the Last Year: Never true   . Ran Out of Food in the Last Year: Never true  Transportation Needs: No Transportation Needs (08/10/2023)   PRAPARE - Transportation   . Lack of Transportation (Medical): No   . Lack of Transportation (Non-Medical): No  Physical Activity: Sufficiently Active (08/10/2023)  Exercise Vital Sign   . Days of Exercise per Week: 3 days   . Minutes of Exercise per Session: 60 min  Stress: No Stress Concern Present (08/10/2023)   [employer] of Occupational Health - Occupational Stress Questionnaire   . Feeling of Stress : Only a little  Social Connections: Moderately Integrated (08/10/2023)   Social Connection and Isolation Panel [NHANES]   . Frequency of Communication with Friends and Family: More than three times a week   . Frequency of Social Gatherings with Friends and Family: More than three times a week   . Attends Religious Services: More than 4 times per year   . Active Member of Clubs or Organizations: Yes   . Attends Banker Meetings: More than 4 times per year   . Marital Status: Widowed  Intimate Partner Violence: Not At Risk (06/30/2023)   Humiliation, Afraid, Rape, and Kick questionnaire   . Fear of Current or Ex-Partner: No   . Emotionally Abused: No   . Physically Abused: No   . Sexually Abused: No    Outpatient Medications Prior to Visit  Medication Sig Dispense Refill  . acetaminophen (TYLENOL) 325 MG tablet Take 325 mg by mouth every morning.    Marland Kitchen atorvastatin (LIPITOR) 20 MG tablet Take 1 tablet (20 mg total) by mouth daily. 90 tablet 3  . bumetanide (BUMEX) 1 MG tablet TAKE 1 TABLET BY MOUTH DAILY 90 tablet 1  . Calcium Carb-Cholecalciferol (CALCIUM 600 + D PO) Take 1 tablet by mouth 2 (two) times daily.    . Carboxymethylcellul-Glycerin (LUBRICATING EYE DROPS OP) Place 1 drop into both eyes in the morning and at  bedtime.    . clindamycin (CLEOCIN) 300 MG capsule Take 1,200 mg by mouth See admin instructions. Before dental work    . ELIQUIS 5 MG TABS tablet TAKE ONE TABLET EVERY MORNING AND AT BEDTIME 180 tablet 1  . lisinopril (ZESTRIL) 10 MG tablet TAKE ONE (1) TABLET BY MOUTH EVERY DAY 90 tablet 1  . loratadine (CLARITIN) 10 MG tablet Take 10 mg by mouth daily.     . Multiple Vitamins-Minerals (ONE-A-DAY WOMENS 50 PLUS PO) Take 1 tablet by mouth daily.    . potassium chloride (KLOR-CON) 10 MEQ tablet TAKE 1 TABLET BY MOUTH IN  THE MORNING AND 2 TABLETS  BY MOUTH IN THE EVENING 270 tablet 3  . Probiotic CAPS Take 1 capsule by mouth daily. Gummy    . psyllium (REGULOID) 0.52 g capsule Take 0.52 g by mouth 2 (two) times daily.     Marland Kitchen amLODipine (NORVASC) 2.5 MG tablet Take 1 tablet (2.5 mg total) by mouth daily. 90 tablet 0   No facility-administered medications prior to visit.    Allergies  Allergen Reactions  . Gabapentin Swelling    Eyes and Face  . Lactose Intolerance (Gi) Other (See Comments)    Bothers her stomach  . Penicillins Hives and Rash    Did it involve swelling of the face/tongue/throat, SOB, or low BP? No Did it involve sudden or severe rash/hives, skin peeling, or any reaction on the inside of your mouth or nose? Yes Did you need to seek medical attention at a hospital or doctor's office? No When did it last happen?       25+ years If all above answers are "NO", may proceed with cephalosporin use.  Hives in throat    . Sulfa Antibiotics Nausea Only and Rash  . Vancomycin Itching  . Zebeta [Bisoprolol Fumarate]  Nausea Only    Review of Systems  Constitutional:  Positive for malaise/fatigue. Negative for fever.  HENT:  Negative for congestion.   Eyes:  Negative for blurred vision.  Respiratory:  Negative for shortness of breath.   Cardiovascular:  Negative for chest pain, palpitations and leg swelling.  Gastrointestinal:  Negative for abdominal pain, blood in stool and  nausea.  Genitourinary:  Negative for dysuria and frequency.  Musculoskeletal:  Positive for falls.  Skin:  Negative for rash.  Neurological:  Negative for dizziness, loss of consciousness and headaches.  Endo/Heme/Allergies:  Negative for environmental allergies.  Psychiatric/Behavioral:  Negative for depression. The patient is not nervous/anxious.       Objective:    Physical Exam Constitutional:      General: She is not in acute distress.    Appearance: Normal appearance. She is well-developed. She is not toxic-appearing.  HENT:     Head: Normocephalic and atraumatic.     Right Ear: External ear normal.     Left Ear: External ear normal.     Nose: Nose normal.  Eyes:     General:        Right eye: No discharge.        Left eye: No discharge.     Conjunctiva/sclera: Conjunctivae normal.  Neck:     Thyroid: No thyromegaly.  Cardiovascular:     Rate and Rhythm: Normal rate and regular rhythm.     Heart sounds: Normal heart sounds. No murmur heard. Pulmonary:     Effort: Pulmonary effort is normal. No respiratory distress.     Breath sounds: Normal breath sounds.  Abdominal:     General: Bowel sounds are normal.     Palpations: Abdomen is soft.     Tenderness: There is no abdominal tenderness. There is no guarding.  Musculoskeletal:        General: Normal range of motion.     Cervical back: Neck supple.  Lymphadenopathy:     Cervical: No cervical adenopathy.  Skin:    General: Skin is warm and dry.  Neurological:     Mental Status: She is alert and oriented to person, place, and time.  Psychiatric:        Mood and Affect: Mood normal.        Behavior: Behavior normal.        Thought Content: Thought content normal.        Judgment: Judgment normal.   BP 138/86 (BP Location: Right Arm, Patient Position: Sitting, Cuff Size: Large)   Pulse 70   Temp 98.1 F (36.7 C) (Oral)   Resp 18   Ht 5\' 3"  (1.6 m)   Wt 285 lb 4.8 oz (129.4 kg)   SpO2 96%   BMI 50.54 kg/m   Wt Readings from Last 3 Encounters:  08/17/23 285 lb 4.8 oz (129.4 kg)  06/30/23 288 lb 3.2 oz (130.7 kg)  05/12/23 286 lb (129.7 kg)    Diabetic Foot Exam - Simple   No data filed    Lab Results  Component Value Date   WBC 8.4 04/05/2023   HGB 14.1 04/05/2023   HCT 42.7 04/05/2023   PLT 222.0 04/05/2023   GLUCOSE 98 04/05/2023   CHOL 163 04/05/2023   TRIG 170.0 (H) 04/05/2023   HDL 50.90 04/05/2023   LDLCALC 79 04/05/2023   ALT 11 04/05/2023   AST 17 04/05/2023   NA 140 04/05/2023   K 4.4 04/05/2023   CL 98 04/05/2023   CREATININE  1.06 04/05/2023   BUN 14 04/05/2023   CO2 30 04/05/2023   TSH 2.01 04/05/2023   INR 1.55 (H) 08/06/2015   HGBA1C 6.0 04/05/2023    Lab Results  Component Value Date   TSH 2.01 04/05/2023   Lab Results  Component Value Date   WBC 8.4 04/05/2023   HGB 14.1 04/05/2023   HCT 42.7 04/05/2023   MCV 96.8 04/05/2023   PLT 222.0 04/05/2023   Lab Results  Component Value Date   NA 140 04/05/2023   K 4.4 04/05/2023   CO2 30 04/05/2023   GLUCOSE 98 04/05/2023   BUN 14 04/05/2023   CREATININE 1.06 04/05/2023   BILITOT 1.1 04/05/2023   ALKPHOS 87 04/05/2023   AST 17 04/05/2023   ALT 11 04/05/2023   PROT 7.0 04/05/2023   ALBUMIN 4.5 04/05/2023   CALCIUM 10.1 04/05/2023   ANIONGAP 6 02/08/2022   GFR 47.41 (L) 04/05/2023   Lab Results  Component Value Date   CHOL 163 04/05/2023   Lab Results  Component Value Date   HDL 50.90 04/05/2023   Lab Results  Component Value Date   LDLCALC 79 04/05/2023   Lab Results  Component Value Date   TRIG 170.0 (H) 04/05/2023   Lab Results  Component Value Date   CHOLHDL 3 04/05/2023   Lab Results  Component Value Date   HGBA1C 6.0 04/05/2023       Assessment & Plan:  Permanent atrial fibrillation (HCC) Assessment & Plan: Rate controlled, tolerating Eliquis   Chronic diastolic HF (heart failure) (HCC) Assessment & Plan: No recent exacerbation following with  cardiology   Essential hypertension Assessment & Plan: Well controlled, no changes to meds. Encouraged heart healthy diet such as the DASH diet and exercise as tolerated.   Orders: -     CBC with Differential/Platelet -     Comprehensive metabolic panel -     TSH  Hyperlipidemia, mixed Assessment & Plan: Tolerating statin, encouraged heart healthy diet, avoid trans fats, minimize simple carbs and saturated fats. Increase exercise as tolerated, have agreed to take over her Atorvastatin when it is time for refill  Orders: -     Lipid panel  Hyperglycemia Assessment & Plan: hgba1c acceptable, minimize simple carbs. Increase exercise as tolerated.   Orders: -     Hemoglobin A1c  Pulmonary fibrosis, unspecified (HCC) Assessment & Plan: No new symptoms.   Other orders -     traMADol HCl; Take 1 tablet (50 mg total) by mouth every 8 (eight) hours as needed for up to 7 days.  Dispense: 20 tablet; Refill: 0 -     amLODIPine Besylate; Take 1 tablet (2.5 mg total) by mouth daily.  Dispense: 90 tablet; Refill: 1    Assessment and Plan    Chronic Pain (Back and Knee) Pain exacerbated by standing for extended periods. Currently managed with Tylenol, up to 6 tablets daily. Discussed potential addition of Tramadol for severe pain days. -Provide a prescription for Tramadol to be used as needed for severe pain days. -Continue Tylenol as scheduled, reduce one dose if Tramadol is used more frequently.  Hypertension Blood pressure not fully controlled. Currently on Lisinopril and Amlodipine 2.5mg . -Continue Lisinopril and Amlodipine 2.5mg  daily. -Recheck blood pressure before end of visit. -Consider increasing Amlodipine to 5mg  if blood pressure remains uncontrolled.  General Health Maintenance -Ordered blood work to monitor overall health. -Plan to recheck blood pressure before end of visit. -Continue to monitor blood pressure at home, report any symptoms  such as headaches or chest  pain. -Scheduled follow-up visit in 4 months.         Danise Edge, MD

## 2023-08-17 NOTE — Patient Instructions (Signed)
Hypertension, Adult High blood pressure (hypertension) is when the force of blood pumping through the arteries is too strong. The arteries are the blood vessels that carry blood from the heart throughout the body. Hypertension forces the heart to work harder to pump blood and may cause arteries to become narrow or stiff. Untreated or uncontrolled hypertension can lead to a heart attack, heart failure, a stroke, kidney disease, and other problems. A blood pressure reading consists of a higher number over a lower number. Ideally, your blood pressure should be below 120/80. The first ("top") number is called the systolic pressure. It is a measure of the pressure in your arteries as your heart beats. The second ("bottom") number is called the diastolic pressure. It is a measure of the pressure in your arteries as the heart relaxes. What are the causes? The exact cause of this condition is not known. There are some conditions that result in high blood pressure. What increases the risk? Certain factors may make you more likely to develop high blood pressure. Some of these risk factors are under your control, including: Smoking. Not getting enough exercise or physical activity. Being overweight. Having too much fat, sugar, calories, or salt (sodium) in your diet. Drinking too much alcohol. Other risk factors include: Having a personal history of heart disease, diabetes, high cholesterol, or kidney disease. Stress. Having a family history of high blood pressure and high cholesterol. Having obstructive sleep apnea. Age. The risk increases with age. What are the signs or symptoms? High blood pressure may not cause symptoms. Very high blood pressure (hypertensive crisis) may cause: Headache. Fast or irregular heartbeats (palpitations). Shortness of breath. Nosebleed. Nausea and vomiting. Vision changes. Severe chest pain, dizziness, and seizures. How is this diagnosed? This condition is diagnosed by  measuring your blood pressure while you are seated, with your arm resting on a flat surface, your legs uncrossed, and your feet flat on the floor. The cuff of the blood pressure monitor will be placed directly against the skin of your upper arm at the level of your heart. Blood pressure should be measured at least twice using the same arm. Certain conditions can cause a difference in blood pressure between your right and left arms. If you have a high blood pressure reading during one visit or you have normal blood pressure with other risk factors, you may be asked to: Return on a different day to have your blood pressure checked again. Monitor your blood pressure at home for 1 week or longer. If you are diagnosed with hypertension, you may have other blood or imaging tests to help your health care provider understand your overall risk for other conditions. How is this treated? This condition is treated by making healthy lifestyle changes, such as eating healthy foods, exercising more, and reducing your alcohol intake. You may be referred for counseling on a healthy diet and physical activity. Your health care provider may prescribe medicine if lifestyle changes are not enough to get your blood pressure under control and if: Your systolic blood pressure is above 130. Your diastolic blood pressure is above 80. Your personal target blood pressure may vary depending on your medical conditions, your age, and other factors. Follow these instructions at home: Eating and drinking  Eat a diet that is high in fiber and potassium, and low in sodium, added sugar, and fat. An example of this eating plan is called the DASH diet. DASH stands for Dietary Approaches to Stop Hypertension. To eat this way: Eat   plenty of fresh fruits and vegetables. Try to fill one half of your plate at each meal with fruits and vegetables. Eat whole grains, such as whole-wheat pasta, brown rice, or whole-grain bread. Fill about one  fourth of your plate with whole grains. Eat or drink low-fat dairy products, such as skim milk or low-fat yogurt. Avoid fatty cuts of meat, processed or cured meats, and poultry with skin. Fill about one fourth of your plate with lean proteins, such as fish, chicken without skin, beans, eggs, or tofu. Avoid pre-made and processed foods. These tend to be higher in sodium, added sugar, and fat. Reduce your daily sodium intake. Many people with hypertension should eat less than 1,500 mg of sodium a day. Do not drink alcohol if: Your health care provider tells you not to drink. You are pregnant, may be pregnant, or are planning to become pregnant. If you drink alcohol: Limit how much you have to: 0-1 drink a day for women. 0-2 drinks a day for men. Know how much alcohol is in your drink. In the [place] one drink equals one 12 oz bottle of beer (355 mL), one 5 oz glass of wine (148 mL), or one 1 oz glass of hard liquor (44 mL). Lifestyle  Work with your health care provider to maintain a healthy body weight or to lose weight. Ask what an ideal weight is for you. Get at least 30 minutes of exercise that causes your heart to beat faster (aerobic exercise) most days of the week. Activities may include walking, swimming, or biking. Include exercise to strengthen your muscles (resistance exercise), such as Pilates or lifting weights, as part of your weekly exercise routine. Try to do these types of exercises for 30 minutes at least 3 days a week. Do not use any products that contain nicotine or tobacco. These products include cigarettes, chewing tobacco, and vaping devices, such as e-cigarettes. If you need help quitting, ask your health care provider. Monitor your blood pressure at home as told by your health care provider. Keep all follow-up visits. This is important. Medicines Take over-the-counter and prescription medicines only as told by your health care provider. Follow directions carefully. Blood  pressure medicines must be taken as prescribed. Do not skip doses of blood pressure medicine. Doing this puts you at risk for problems and can make the medicine less effective. Ask your health care provider about side effects or reactions to medicines that you should watch for. Contact a health care provider if you: Think you are having a reaction to a medicine you are taking. Have headaches that keep coming back (recurring). Feel dizzy. Have swelling in your ankles. Have trouble with your vision. Get help right away if you: Develop a severe headache or confusion. Have unusual weakness or numbness. Feel faint. Have severe pain in your chest or abdomen. Vomit repeatedly. Have trouble breathing. These symptoms may be an emergency. Get help right away. Call 911. Do not wait to see if the symptoms will go away. Do not drive yourself to the hospital. Summary Hypertension is when the force of blood pumping through your arteries is too strong. If this condition is not controlled, it may put you at risk for serious complications. Your personal target blood pressure may vary depending on your medical conditions, your age, and other factors. For most people, a normal blood pressure is less than 120/80. Hypertension is treated with lifestyle changes, medicines, or a combination of both. Lifestyle changes include losing weight, eating a healthy,   low-sodium diet, exercising more, and limiting alcohol. This information is not intended to replace advice given to you by your health care provider. Make sure you discuss any questions you have with your health care provider. Document Revised: 06/24/2021 Document Reviewed: 06/24/2021 Elsevier Patient Education  2024 [employer]  

## 2023-08-18 LAB — CBC WITH DIFFERENTIAL/PLATELET
Basophils Absolute: 0.1 10*3/uL (ref 0.0–0.1)
Basophils Relative: 1.2 % (ref 0.0–3.0)
Eosinophils Absolute: 0.1 10*3/uL (ref 0.0–0.7)
Eosinophils Relative: 2 % (ref 0.0–5.0)
HCT: 42.6 % (ref 36.0–46.0)
Hemoglobin: 14.2 g/dL (ref 12.0–15.0)
Lymphocytes Relative: 17.7 % (ref 12.0–46.0)
Lymphs Abs: 1.3 10*3/uL (ref 0.7–4.0)
MCHC: 33.3 g/dL (ref 30.0–36.0)
MCV: 98.8 fl (ref 78.0–100.0)
Monocytes Absolute: 0.5 10*3/uL (ref 0.1–1.0)
Monocytes Relative: 7.4 % (ref 3.0–12.0)
Neutro Abs: 5.1 10*3/uL (ref 1.4–7.7)
Neutrophils Relative %: 71.7 % (ref 43.0–77.0)
Platelets: 243 10*3/uL (ref 150.0–400.0)
RBC: 4.31 Mil/uL (ref 3.87–5.11)
RDW: 13.1 % (ref 11.5–15.5)
WBC: 7.2 10*3/uL (ref 4.0–10.5)

## 2023-08-18 LAB — LIPID PANEL
Cholesterol: 161 mg/dL (ref 0–200)
HDL: 48.3 mg/dL
LDL Cholesterol: 74 mg/dL (ref 0–99)
NonHDL: 112.52
Total CHOL/HDL Ratio: 3
Triglycerides: 194 mg/dL — ABNORMAL HIGH (ref 0.0–149.0)
VLDL: 38.8 mg/dL (ref 0.0–40.0)

## 2023-08-18 LAB — COMPREHENSIVE METABOLIC PANEL WITH GFR
ALT: 13 U/L (ref 0–35)
AST: 19 U/L (ref 0–37)
Albumin: 4.5 g/dL (ref 3.5–5.2)
Alkaline Phosphatase: 92 U/L (ref 39–117)
BUN: 22 mg/dL (ref 6–23)
CO2: 30 meq/L (ref 19–32)
Calcium: 9.9 mg/dL (ref 8.4–10.5)
Chloride: 100 meq/L (ref 96–112)
Creatinine, Ser: 1.02 mg/dL (ref 0.40–1.20)
GFR: 49.52 mL/min — ABNORMAL LOW
Glucose, Bld: 100 mg/dL — ABNORMAL HIGH (ref 70–99)
Potassium: 4.1 meq/L (ref 3.5–5.1)
Sodium: 141 meq/L (ref 135–145)
Total Bilirubin: 1 mg/dL (ref 0.2–1.2)
Total Protein: 7.1 g/dL (ref 6.0–8.3)

## 2023-08-18 LAB — HEMOGLOBIN A1C: Hgb A1c MFr Bld: 6.1 % (ref 4.6–6.5)

## 2023-08-18 LAB — TSH: TSH: 2.14 u[IU]/mL (ref 0.35–5.50)

## 2023-09-07 ENCOUNTER — Ambulatory Visit (INDEPENDENT_AMBULATORY_CARE_PROVIDER_SITE_OTHER): Payer: Medicare Other | Admitting: Family

## 2023-09-07 ENCOUNTER — Ambulatory Visit (HOSPITAL_BASED_OUTPATIENT_CLINIC_OR_DEPARTMENT_OTHER)
Admission: RE | Admit: 2023-09-07 | Discharge: 2023-09-07 | Disposition: A | Discharge status: Home / Self Care | Payer: Medicare Other | Source: Ambulatory Visit | Attending: Family | Admitting: Family

## 2023-09-07 VITALS — BP 122/68 | HR 76 | Ht 63.0 in | Wt 286.8 lb

## 2023-09-07 DIAGNOSIS — M1711 Unilateral primary osteoarthritis, right knee: Secondary | ICD-10-CM | POA: Diagnosis not present

## 2023-09-07 DIAGNOSIS — M25561 Pain in right knee: Secondary | ICD-10-CM | POA: Insufficient documentation

## 2023-09-07 DIAGNOSIS — M545 Low back pain, unspecified: Secondary | ICD-10-CM | POA: Diagnosis not present

## 2023-09-07 DIAGNOSIS — G8929 Other chronic pain: Secondary | ICD-10-CM | POA: Diagnosis not present

## 2023-09-07 DIAGNOSIS — M47816 Spondylosis without myelopathy or radiculopathy, lumbar region: Secondary | ICD-10-CM | POA: Diagnosis not present

## 2023-09-07 DIAGNOSIS — M4316 Spondylolisthesis, lumbar region: Secondary | ICD-10-CM | POA: Diagnosis not present

## 2023-09-07 MED ORDER — METHYLPREDNISOLONE 4 MG PO TBPK: ORAL_TABLET | ORAL | 0 refills | Status: DC

## 2023-09-07 NOTE — Progress Notes (Signed)
 Tracy Miles is a 89 y.o. female with the following history as recorded in Epic[employer]:  Patient Active Problem List   Diagnosis Date Noted   Allergic rhinitis 11/26/2022   Grief at loss of child 09/08/2022   Vertigo 05/23/2021   Sun-damaged skin 11/15/2020   Rosacea 11/15/2020   History of oral lesions 05/15/2020   Persistent atrial fibrillation (HCC) 04/25/2019   Pulmonary fibrosis, unspecified (HCC) 04/11/2019   Abnormal chest x-ray 03/20/2019   At risk for amiodarone  toxicity with long term use 03/20/2019   Hyperglycemia 11/22/2017   Urinary urgency 11/22/2017   Pedal edema 08/28/2016   Medicare annual wellness visit, subsequent 09/15/2015   Chronic diastolic HF (heart failure) (HCC) 08/11/2015   Sleep apnea in adult 02/22/2015   Spinal stenosis of lumbar region 04/02/2014   Urinary tract infection with hematuria 03/26/2014   History of colonic polyps 02/25/2014   Decreased visual acuity 02/25/2014   Benign paroxysmal positional vertigo 12/17/2013   Abnormal results of thyroid  function studies 12/17/2013   History of DVT (deep vein thrombosis) 11/19/2013   Allergy 11/19/2013   OA (osteoarthritis) 11/19/2013   Constipation 11/16/2013   Cervical disc disease 11/16/2013   Obesity    Iron deficiency anemia 11/13/2013   Fatigue 11/13/2013   Hyperlipidemia, mixed 10/19/2013   Atrial fibrillation (HCC) 10/04/2013   Essential hypertension 10/04/2013    Current Outpatient Medications  Medication Sig Dispense Refill   acetaminophen  (TYLENOL ) 325 MG tablet Take 325 mg by mouth every morning.     amLODipine  (NORVASC ) 2.5 MG tablet Take 1 tablet (2.5 mg total) by mouth daily. 90 tablet 1   atorvastatin  (LIPITOR) 20 MG tablet Take 1 tablet (20 mg total) by mouth daily. 90 tablet 3   bumetanide  (BUMEX ) 1 MG tablet TAKE 1 TABLET BY MOUTH DAILY 90 tablet 1   Calcium  Carb-Cholecalciferol  (CALCIUM  600 + D PO) Take 1 tablet by mouth 2 (two) times daily.     Carboxymethylcellul-Glycerin  (LUBRICATING EYE DROPS OP) Place 1 drop into both eyes in the morning and at bedtime.     clindamycin  (CLEOCIN ) 300 MG capsule Take 1,200 mg by mouth See admin instructions. Before dental work     ELIQUIS  5 MG TABS tablet TAKE ONE TABLET EVERY MORNING AND AT BEDTIME 180 tablet 1   lisinopril  (ZESTRIL ) 10 MG tablet TAKE ONE (1) TABLET BY MOUTH EVERY DAY 90 tablet 1   loratadine  (CLARITIN ) 10 MG tablet Take 10 mg by mouth daily.      Multiple Vitamins-Minerals (ONE-A-DAY WOMENS 50 PLUS PO) Take 1 tablet by mouth daily.     potassium chloride  (KLOR-CON ) 10 MEQ tablet TAKE 1 TABLET BY MOUTH IN  THE MORNING AND 2 TABLETS  BY MOUTH IN THE EVENING 270 tablet 3   Probiotic CAPS Take 1 capsule by mouth daily. Gummy     psyllium (REGULOID) 0.52 g capsule Take 0.52 g by mouth 2 (two) times daily.      methylPREDNISolone  (MEDROL  DOSEPAK) 4 MG TBPK tablet Take per package instructions for 6 days. 21 tablet 0   No current facility-administered medications for this visit.    Allergies: Gabapentin, Lactose intolerance (gi), Penicillins, Sulfa antibiotics, Vancomycin , and Zebeta [bisoprolol fumarate]  Past Medical History:  Diagnosis Date   Allergy    Anemia    Aortic stenosis    Very mild   Benign paroxysmal positional vertigo 12/17/2013   Chicken pox as a child   Colon polyp    Coronary artery disease    DDD (  degenerative disc disease) 11/16/2013   Hyperlipidemia    Hypertension    Iron deficiency anemia 11/13/2013   Mumps child and teenager   OA (osteoarthritis) 11/19/2013   S/p L TKR   Obesity    OSA on CPAP    Pancreatitis    post hysterectomy   Persistent atrial fibrillation (HCC)    Personal history of colonic polyps 02/25/2014   Personal history of DVT (deep vein thrombosis) X 2   left   Sleep apnea    Spinal stenosis     Past Surgical History:  Procedure Laterality Date   APPENDECTOMY     ATRIAL FIBRILLATION ABLATION N/A 04/25/2019   Procedure: ATRIAL FIBRILLATION ABLATION;   Surgeon: Kelsie Agent, MD;  Location: [facility];  Service: Cardiovascular;  Laterality: N/A;   AV NODE ABLATION N/A 03/23/2022   Procedure: AV NODE ABLATION;  Surgeon: Cindie Ole DASEN, MD;  Location: [facility];  Service: Cardiovascular;  Laterality: N/A;   CARDIOVERSION N/A 08/09/2015   Procedure: CARDIOVERSION;  Surgeon: Oneil JAYSON Parchment, MD;  Location: [facility] ENDOSCOPY;  Service: Cardiovascular;  Laterality: N/A;   CARDIOVERSION N/A 09/30/2018   Procedure: CARDIOVERSION;  Surgeon: Maranda Leim DEL, MD;  Location: [facility] ENDOSCOPY;  Service: Cardiovascular;  Laterality: N/A;   CARDIOVERSION N/A 02/27/2019   Procedure: CARDIOVERSION;  Surgeon: Lonni Slain, MD;  Location: [facility] ENDOSCOPY;  Service: Cardiovascular;  Laterality: N/A;   CARDIOVERSION N/A 06/07/2019   Procedure: CARDIOVERSION;  Surgeon: Maranda Leim DEL, MD;  Location: [facility] ENDOSCOPY;  Service: Cardiovascular;  Laterality: N/A;   CATARACT EXTRACTION W/ INTRAOCULAR LENS  IMPLANT, BILATERAL Bilateral 2016   CYST REMOVAL HAND Bilateral 1990's   played to much golf   DILATION AND CURETTAGE OF UTERUS     EYE SURGERY     implantable loop recorder placement  11/20/2019   Medtronic Reveal Linq model LNQ 22 (model number [id number]) implanted in office by Dr Kelsie for afib management   JOINT REPLACEMENT     LUMBAR LAMINECTOMY  40 yrs ago   L3-4   MENISCUS REPAIR Bilateral 2000 and 2010   PACEMAKER IMPLANT N/A 02/06/2022   Procedure: PACEMAKER IMPLANT;  Surgeon: Cindie Ole DASEN, MD;  Location: [facility];  Service: Cardiovascular;  Laterality: N/A;   PACEMAKER INSERTION     REPLACEMENT TOTAL KNEE Left 2013   SPINE SURGERY     TEE WITHOUT CARDIOVERSION N/A 04/24/2019   Procedure: TRANSESOPHAGEAL ECHOCARDIOGRAM (TEE);  Surgeon: Alveta Aleene PARAS, MD;  Location: [facility] ENDOSCOPY;  Service: Cardiovascular;  Laterality: N/A;   TONSILECTOMY, ADENOIDECTOMY, BILATERAL MYRINGOTOMY AND TUBES  child   TOTAL  ABDOMINAL HYSTERECTOMY  1995   had 2 tumors- benign   TUBAL LIGATION  89 years old    Family History  Problem Relation Age of Onset   Heart attack Mother 15   Hyperlipidemia Mother        ?   Dementia Mother    Pernicious anemia Mother    Heart disease Mother    COPD Father        smoker   Cancer Father 56       prostate   Heart disease Brother        quadruple bipass surgery   Hyperlipidemia Brother    Hypertension Brother    Diabetes Maternal Grandmother        type 2   Pernicious anemia Maternal Grandmother    Gout Son    Atrial fibrillation Son    Hyperlipidemia Son  Cancer Maternal Grandfather        liver   Cancer Paternal Grandmother        lung- doesn't think she smokes?   Stroke Paternal Grandfather    Cancer Son 19       non hodgin's lymphoma   Gout Son    Hyperlipidemia Son    Sleep apnea Son    Heart disease Son    Colon cancer Neg Hx    Pancreatic cancer Neg Hx    Stomach cancer Neg Hx    Throat cancer Neg Hx    Liver disease Neg Hx     Social History   Tobacco Use   Smoking status: Never   Smokeless tobacco: Never   Tobacco comments:    never used tobacco  Substance Use Topics   Alcohol  use: No    Subjective:   Known history of chronic back issues and right knee pain; symptoms have been present for 6 months; has been trying to do exercises in heated pool but feels like this might be actually making things worse. Discussed these symptoms with her PCP in mid-December- was given Tramadol  with limited relief;  Patient is concerned about change from baseline with her symptoms- this pain is worse than my normal pain. Wants to be sure not missing something else; would be interested in updating imaging;     Objective:  Vitals:   09/07/23 1030  BP: 122/68  Pulse: 76  SpO2: 97%  Weight: 286 lb 12.8 oz (130.1 kg)  Height: 5' 3 (1.6 m)    General: Well developed, well nourished, in no acute distress  Skin : Warm and dry.  Head:  Normocephalic and atraumatic  Lungs: Respirations unlabored;  Musculoskeletal: No deformities; no active joint inflammation  Extremities: No edema, cyanosis, clubbing  Vessels: Symmetric bilaterally  Neurologic: Alert and oriented; speech intact; face symmetrical; moves all extremities well; CNII-XII intact without focal deficit   Assessment:  1. Chronic pain of right knee   2. Chronic bilateral low back pain, unspecified whether sciatica present     Plan:  Will update X-ray of right knee and lumbar spine; patient defers refill on Tramdol- prefers to continue with Tylenol ; will re-treat with Medrol  Dose Pak which she has tolerated well in the past; to consider referral to physical therapy; follow up to be determined;   No follow-ups on file.  Orders Placed This Encounter  Procedures   DG Knee 1-2 Views Right    Standing Status:   Future    Number of Occurrences:   1    Expiration Date:   03/06/2024    Reason for Exam (SYMPTOM  OR DIAGNOSIS REQUIRED):   right knee pain    Preferred imaging location?:   [facility]   DG Lumbar Spine Complete    Standing Status:   Future    Number of Occurrences:   1    Expiration Date:   09/06/2024    Reason for Exam (SYMPTOM  OR DIAGNOSIS REQUIRED):   chronic low back pain    Preferred imaging location?:   [facility]    Requested Prescriptions   Signed Prescriptions Disp Refills   methylPREDNISolone  (MEDROL  DOSEPAK) 4 MG TBPK tablet 21 tablet 0    Sig: Take per package instructions for 6 days.

## 2023-09-13 ENCOUNTER — Encounter: Payer: Self-pay | Admitting: Family

## 2023-09-14 ENCOUNTER — Other Ambulatory Visit: Payer: Self-pay | Admitting: Family Medicine

## 2023-09-16 ENCOUNTER — Other Ambulatory Visit: Payer: Self-pay | Admitting: Family

## 2023-09-16 DIAGNOSIS — G8929 Other chronic pain: Secondary | ICD-10-CM

## 2023-09-17 NOTE — Progress Notes (Signed)
Remote pacemaker transmission.   

## 2023-09-17 NOTE — Telephone Encounter (Signed)
Pt orders have been placed

## 2023-09-17 NOTE — Telephone Encounter (Signed)
Pt orders have been faxed.

## 2023-09-17 NOTE — Telephone Encounter (Signed)
Fax number is [phone].

## 2023-09-17 NOTE — Addendum Note (Signed)
Addended by: Elease Etienne A on: 09/17/2023 03:43 PM   Modules accepted: Orders

## 2023-09-27 DIAGNOSIS — R2689 Other abnormalities of gait and mobility: Secondary | ICD-10-CM | POA: Diagnosis not present

## 2023-09-27 DIAGNOSIS — M48061 Spinal stenosis, lumbar region without neurogenic claudication: Secondary | ICD-10-CM | POA: Diagnosis not present

## 2023-09-27 DIAGNOSIS — M6281 Muscle weakness (generalized): Secondary | ICD-10-CM | POA: Diagnosis not present

## 2023-09-27 DIAGNOSIS — M25561 Pain in right knee: Secondary | ICD-10-CM | POA: Diagnosis not present

## 2023-09-27 DIAGNOSIS — M5440 Lumbago with sciatica, unspecified side: Secondary | ICD-10-CM | POA: Insufficient documentation

## 2023-09-27 NOTE — Assessment & Plan Note (Signed)
Monitor and report any concerns, no changes to meds. Encouraged heart healthy diet such as the DASH diet and exercise as tolerated.  ?

## 2023-09-27 NOTE — Assessment & Plan Note (Signed)
Encouraged moist heat and gentle stretching as tolerated. May try NSAIDs and prescription meds as directed and report if symptoms worsen or seek immediate care

## 2023-09-27 NOTE — Assessment & Plan Note (Signed)
Symptom control and tolerating meds

## 2023-09-27 NOTE — Assessment & Plan Note (Signed)
No recent exacerbation no changes

## 2023-09-27 NOTE — Assessment & Plan Note (Signed)
Encouraged DASH or MIND diet, decrease po intake and increase exercise as tolerated. Needs 7-8 hours of sleep nightly. Avoid trans fats, eat small, frequent meals every 4-5 hours with lean proteins, complex carbs and healthy fats. Minimize simple carbs, high fat foods and processed foods

## 2023-09-28 ENCOUNTER — Telehealth (INDEPENDENT_AMBULATORY_CARE_PROVIDER_SITE_OTHER): Payer: Medicare Other | Admitting: Family Medicine

## 2023-09-28 VITALS — BP 142/81 | HR 68 | Ht 63.0 in | Wt 265.0 lb

## 2023-09-28 DIAGNOSIS — Z6841 Body Mass Index (BMI) 40.0 and over, adult: Secondary | ICD-10-CM

## 2023-09-28 DIAGNOSIS — I10 Essential (primary) hypertension: Secondary | ICD-10-CM | POA: Diagnosis not present

## 2023-09-28 DIAGNOSIS — M5440 Lumbago with sciatica, unspecified side: Secondary | ICD-10-CM | POA: Diagnosis not present

## 2023-09-28 DIAGNOSIS — E6609 Other obesity due to excess calories: Secondary | ICD-10-CM | POA: Diagnosis not present

## 2023-09-28 DIAGNOSIS — I4819 Other persistent atrial fibrillation: Secondary | ICD-10-CM | POA: Diagnosis not present

## 2023-09-28 DIAGNOSIS — I5032 Chronic diastolic (congestive) heart failure: Secondary | ICD-10-CM

## 2023-09-28 MED ORDER — TIZANIDINE HCL 2 MG PO TABS: 1.0000 mg | ORAL_TABLET | Freq: Three times a day (TID) | ORAL | 2 refills | Status: DC | PRN

## 2023-09-29 ENCOUNTER — Encounter: Payer: Self-pay | Admitting: Family Medicine

## 2023-09-29 NOTE — Progress Notes (Signed)
MyChart Video Visit    Virtual Visit via Video Note   This patient is at least at moderate risk for complications without adequate follow up. This format is felt to be most appropriate for this patient at this time. Physical exam was limited by quality of the video and audio technology used for the visit. Juanetta, CMA was able to get the patient set up on a video visit.  Patient location: home Patient and provider in visit Provider location: Office  I discussed the limitations of evaluation and management by telemedicine and the availability of in person appointments. The patient expressed understanding and agreed to proceed.  Visit Date: 09/28/2023  Today's healthcare provider: Danise Edge, MD     Subjective:    Patient ID: Tracy Miles, female    DOB: 04-08-1934, 89 y.o.   MRN: [id number]  Chief Complaint  Patient presents with   Follow-up    HPI Discussed the use of AI scribe software for clinical note transcription with the patient, who gave verbal consent to proceed.  History of Present Illness   The patient, with a history of left knee replacement and blood clots in the left leg, presents with severe pain in the lower back and posterior hip on the left side. The pain, which started in October, has been unresponsive to previous treatments, including prednisone and tramadol. The patient describes the pain as an 8 or 9 out of 10, and it is severe enough to disrupt sleep and affect daily life. The pain is particularly excruciating when changing positions. The patient has started using a walker due to fear of falling and has begun physical therapy, which they are attending twice a week.        Past Medical History:  Diagnosis Date   Allergy    Anemia    Aortic stenosis    Very mild   Benign paroxysmal positional vertigo 12/17/2013   Chicken pox as a child   Colon polyp    Coronary artery disease    DDD (degenerative disc disease) 11/16/2013   Hyperlipidemia     Hypertension    Iron deficiency anemia 11/13/2013   Mumps child and teenager   OA (osteoarthritis) 11/19/2013   S/p L TKR   Obesity    OSA on CPAP    Pancreatitis    post hysterectomy   Persistent atrial fibrillation (HCC)    Personal history of colonic polyps 02/25/2014   Personal history of DVT (deep vein thrombosis) X 2   "left"   Sleep apnea    Spinal stenosis     Past Surgical History:  Procedure Laterality Date   APPENDECTOMY     ATRIAL FIBRILLATION ABLATION N/A 04/25/2019   Procedure: ATRIAL FIBRILLATION ABLATION;  Surgeon: Hillis Range, MD;  [facility];  Service: Cardiovascular;  Laterality: N/A;   AV NODE ABLATION N/A 03/23/2022   Procedure: AV NODE ABLATION;  Surgeon: Lanier Prude, MD;  Location: [facility];  Service: Cardiovascular;  Laterality: N/A;   CARDIOVERSION N/A 08/09/2015   Procedure: CARDIOVERSION;  Surgeon: Jake Bathe, MD;  Location: [facility] ENDOSCOPY;  Service: Cardiovascular;  Laterality: N/A;   CARDIOVERSION N/A 09/30/2018   Procedure: CARDIOVERSION;  Surgeon: Lars Masson, MD;  Location: [facility] ENDOSCOPY;  Service: Cardiovascular;  Laterality: N/A;   CARDIOVERSION N/A 02/27/2019   Procedure: CARDIOVERSION;  Surgeon: Jodelle Red, MD;  Location: [facility] ENDOSCOPY;  Service: Cardiovascular;  Laterality: N/A;   CARDIOVERSION N/A 06/07/2019   Procedure: CARDIOVERSION;  Surgeon: Lars Masson, MD;  Location: [facility] ENDOSCOPY;  Service: Cardiovascular;  Laterality: N/A;   CATARACT EXTRACTION W/ INTRAOCULAR LENS  IMPLANT, BILATERAL Bilateral 2016   CYST REMOVAL HAND Bilateral 1990's   "played to much golf"   DILATION AND CURETTAGE OF UTERUS     EYE SURGERY     implantable loop recorder placement  11/20/2019   Medtronic Reveal Linq model LNQ 22 (model number [id number]) implanted in office by Dr Johney Frame for afib management   JOINT REPLACEMENT     LUMBAR LAMINECTOMY  40 yrs ago   "L3-4"   MENISCUS REPAIR Bilateral  2000 and 2010   PACEMAKER IMPLANT N/A 02/06/2022   Procedure: PACEMAKER IMPLANT;  Surgeon: Lanier Prude, MD;  Location: [facility];  Service: Cardiovascular;  Laterality: N/A;   PACEMAKER INSERTION     REPLACEMENT TOTAL KNEE Left 2013   SPINE SURGERY     TEE WITHOUT CARDIOVERSION N/A 04/24/2019   Procedure: TRANSESOPHAGEAL ECHOCARDIOGRAM (TEE);  Surgeon: Elease Hashimoto Deloris Ping, MD;  Location: [facility] ENDOSCOPY;  Service: Cardiovascular;  Laterality: N/A;   TONSILECTOMY, ADENOIDECTOMY, BILATERAL MYRINGOTOMY AND TUBES  child   TOTAL ABDOMINAL HYSTERECTOMY  1995   had 2 tumors- benign   TUBAL LIGATION  89 years old    Family History  Problem Relation Age of Onset   Heart attack Mother 5   Hyperlipidemia Mother        ?   Dementia Mother    Pernicious anemia Mother    Heart disease Mother    COPD Father        smoker   Cancer Father 66       prostate   Heart disease Brother        quadruple bipass surgery   Hyperlipidemia Brother    Hypertension Brother    Diabetes Maternal Grandmother        type 2   Pernicious anemia Maternal Grandmother    Gout Son    Atrial fibrillation Son    Hyperlipidemia Son    Cancer Maternal Grandfather        liver   Cancer Paternal Grandmother        lung- doesn't think she smokes?   Stroke Paternal Grandfather    Cancer Son 42       non hodgin's lymphoma   Gout Son    Hyperlipidemia Son    Sleep apnea Son    Heart disease Son    Colon cancer Neg Hx    Pancreatic cancer Neg Hx    Stomach cancer Neg Hx    Throat cancer Neg Hx    Liver disease Neg Hx     Social History   Socioeconomic History   Marital status: Widowed    Spouse name: Not on file   Number of children: 3   Years of education: Not on file   Highest education level: Bachelor's degree (e.g., BA, AB, BS)  Occupational History   Occupation: retired Runner, broadcasting/film/video  Tobacco Use   Smoking status: Never   Smokeless tobacco: Never   Tobacco comments:    never used tobacco   Substance and Sexual Activity   Alcohol use: No   Drug use: No   Sexual activity: Never    Comment: lives [place], low sodium diet  Other Topics Concern   Not on file  Social History Narrative   Not on file   Social Drivers of Health   Financial Resource Strain: Low Risk  (09/21/2023)  Overall Financial Resource Strain (CARDIA)    Difficulty of Paying Living Expenses: Not hard at all  Food Insecurity: No Food Insecurity (09/21/2023)   Hunger Vital Sign    Worried About Running Out of Food in the Last Year: Never true    Ran Out of Food in the Last Year: Never true  Transportation Needs: No Transportation Needs (09/21/2023)   PRAPARE - Administrator, Civil Service (Medical): No    Lack of Transportation (Non-Medical): No  Physical Activity: Sufficiently Active (09/21/2023)   Exercise Vital Sign    Days of Exercise per Week: 3 days    Minutes of Exercise per Session: 60 min  Stress: No Stress Concern Present (09/21/2023)   [employer] of Occupational Health - Occupational Stress Questionnaire    Feeling of Stress : Only a little  Social Connections: Moderately Integrated (09/21/2023)   Social Connection and Isolation Panel [NHANES]    Frequency of Communication with Friends and Family: More than three times a week    Frequency of Social Gatherings with Friends and Family: More than three times a week    Attends Religious Services: More than 4 times per year    Active Member of [employer] or Organizations: Yes    Attends Banker Meetings: More than 4 times per year    Marital Status: Widowed  Intimate Partner Violence: Not At Risk (06/30/2023)   Humiliation, Afraid, Rape, and Kick questionnaire    Fear of Current or Ex-Partner: No    Emotionally Abused: No    Physically Abused: No    Sexually Abused: No    Outpatient Medications Prior to Visit  Medication Sig Dispense Refill   acetaminophen (TYLENOL) 325 MG tablet Take 325 mg by mouth every  morning.     amLODipine (NORVASC) 2.5 MG tablet Take 1 tablet (2.5 mg total) by mouth daily. 90 tablet 1   atorvastatin (LIPITOR) 20 MG tablet Take 1 tablet (20 mg total) by mouth daily. 90 tablet 1   bumetanide (BUMEX) 1 MG tablet TAKE 1 TABLET BY MOUTH DAILY 90 tablet 1   Calcium Carb-Cholecalciferol (CALCIUM 600 + D PO) Take 1 tablet by mouth 2 (two) times daily.     Carboxymethylcellul-Glycerin (LUBRICATING EYE DROPS OP) Place 1 drop into both eyes in the morning and at bedtime.     ELIQUIS 5 MG TABS tablet TAKE ONE TABLET EVERY MORNING AND AT BEDTIME 180 tablet 1   lisinopril (ZESTRIL) 10 MG tablet TAKE ONE (1) TABLET BY MOUTH EVERY DAY 90 tablet 1   loratadine (CLARITIN) 10 MG tablet Take 10 mg by mouth daily.      Multiple Vitamins-Minerals (ONE-A-DAY WOMENS 50 PLUS PO) Take 1 tablet by mouth daily.     potassium chloride (KLOR-CON) 10 MEQ tablet TAKE 1 TABLET BY MOUTH IN  THE MORNING AND 2 TABLETS  BY MOUTH IN THE EVENING 270 tablet 3   Probiotic CAPS Take 1 capsule by mouth daily. Gummy     psyllium (REGULOID) 0.52 g capsule Take 0.52 g by mouth 2 (two) times daily.      clindamycin (CLEOCIN) 300 MG capsule Take 1,200 mg by mouth See admin instructions. Before dental work     methylPREDNISolone (MEDROL DOSEPAK) 4 MG TBPK tablet Take per package instructions for 6 days. 21 tablet 0   No facility-administered medications prior to visit.    Allergies  Allergen Reactions   Gabapentin Swelling    Eyes and Face   Lactose Intolerance (Gi)  Other (See Comments)    Bothers her stomach   Penicillins Hives and Rash    Did it involve swelling of the face/tongue/throat, SOB, or low BP? No Did it involve sudden or severe rash/hives, skin peeling, or any reaction on the inside of your mouth or nose? Yes Did you need to seek medical attention at a hospital or doctor's office? No When did it last happen?       25+ years If all above answers are "NO", may proceed with cephalosporin use.  Hives  in throat     Sulfa Antibiotics Nausea Only and Rash   Vancomycin Itching   Zebeta [Bisoprolol Fumarate] Nausea Only    Review of Systems  Constitutional:  Positive for malaise/fatigue. Negative for fever.  HENT:  Negative for congestion.   Eyes:  Negative for blurred vision.  Respiratory:  Negative for shortness of breath.   Cardiovascular:  Negative for chest pain, palpitations and leg swelling.  Gastrointestinal:  Negative for abdominal pain, blood in stool and nausea.  Genitourinary:  Negative for dysuria and frequency.  Musculoskeletal:  Positive for back pain. Negative for falls.  Skin:  Negative for rash.  Neurological:  Negative for dizziness, loss of consciousness and headaches.  Endo/Heme/Allergies:  Negative for environmental allergies.  Psychiatric/Behavioral:  Negative for depression. The patient is not nervous/anxious.       Objective:    Physical Exam Constitutional:      General: She is not in acute distress.    Appearance: Normal appearance. She is well-developed. She is not toxic-appearing.  HENT:     Head: Normocephalic and atraumatic.     Right Ear: External ear normal.     Left Ear: External ear normal.     Nose: Nose normal.  Eyes:     General:        Right eye: No discharge.        Left eye: No discharge.     Conjunctiva/sclera: Conjunctivae normal.  Neck:     Thyroid: No thyromegaly.  Cardiovascular:     Rate and Rhythm: Normal rate and regular rhythm.     Heart sounds: Normal heart sounds. No murmur heard. Pulmonary:     Effort: Pulmonary effort is normal. No respiratory distress.     Breath sounds: Normal breath sounds.  Abdominal:     General: Bowel sounds are normal.     Palpations: Abdomen is soft.     Tenderness: There is no abdominal tenderness. There is no guarding.  Musculoskeletal:        General: Normal range of motion.     Cervical back: Neck supple.  Lymphadenopathy:     Cervical: No cervical adenopathy.  Skin:    General:  Skin is warm and dry.  Neurological:     Mental Status: She is alert and oriented to person, place, and time.  Psychiatric:        Mood and Affect: Mood normal.        Behavior: Behavior normal.        Thought Content: Thought content normal.        Judgment: Judgment normal.   BP (!) 142/81   Pulse 68   Ht 5\' 3"  (1.6 m) Comment: Patient stated  Wt 265 lb (120.2 kg) Comment: Patient stated  BMI 46.94 kg/m  Wt Readings from Last 3 Encounters:  09/28/23 265 lb (120.2 kg)  09/07/23 286 lb 12.8 oz (130.1 kg)  08/17/23 285 lb 4.8 oz (129.4 kg)  Assessment & Plan:  Chronic diastolic HF (heart failure) (HCC) Assessment & Plan: No recent exacerbation no changes   Essential hypertension Assessment & Plan: Monitor and report any concerns, no changes to meds. Encouraged heart healthy diet such as the DASH diet and exercise as tolerated.    Persistent atrial fibrillation (HCC) Assessment & Plan: Symptom control and tolerating meds   Obesity due to excess calories, unspecified class, unspecified whether serious comorbidity present Assessment & Plan: Encouraged DASH or MIND diet, decrease po intake and increase exercise as tolerated. Needs 7-8 hours of sleep nightly. Avoid trans fats, eat small, frequent meals every 4-5 hours with lean proteins, complex carbs and healthy fats. Minimize simple carbs, high fat foods and processed foods.    Low back pain of thoracolumbar region with sciatica Assessment & Plan: Encouraged moist heat and gentle stretching as tolerated. May try NSAIDs and prescription meds as directed and report if symptoms worsen or seek immediate care   Orders: -     Ambulatory referral to Neurosurgery -     CT LUMBAR SPINE WO CONTRAST; Future  Other orders -     tiZANidine HCl; Take 0.5-2 tablets (1-4 mg total) by mouth every 8 (eight) hours as needed for muscle spasms.  Dispense: 40 tablet; Refill: 2     Assessment and Plan    Chronic Low Back and Hip  Pain Severe pain (8-9/10) disrupting sleep and daily activities. Previous trials of prednisone and tramadol with limited relief. X-ray findings suggestive of lumbar disc disease and spondylolisthesis. Pain likely secondary to nerve impingement. -Start Tizanidine for muscle relaxation and pain control. Half to one tablet during the day as needed, one to two tablets at bedtime. -Continue physical therapy as tolerated. -Order CT lumbar spine for further evaluation. -Referral to Neurosurgery for evaluation and potential intervention.  Osteoarthritis of the Knee Chronic pain managed with Extra Strength Tylenol. -Continue Extra Strength Tylenol as needed, up to 6 tablets daily.  Follow-up in April or sooner if pain is not controlled or worsens.         I discussed the assessment and treatment plan with the patient. The patient was provided an opportunity to ask questions and all were answered. The patient agreed with the plan and demonstrated an understanding of the instructions.   The patient was advised to call back or seek an in-person evaluation if the symptoms worsen or if the condition fails to improve as anticipated.  Danise Edge, MD [facility] at [facility] [phone] (phone) [phone] (fax)  [facility]

## 2023-09-30 DIAGNOSIS — M6281 Muscle weakness (generalized): Secondary | ICD-10-CM | POA: Diagnosis not present

## 2023-09-30 DIAGNOSIS — M48061 Spinal stenosis, lumbar region without neurogenic claudication: Secondary | ICD-10-CM | POA: Diagnosis not present

## 2023-09-30 DIAGNOSIS — M25561 Pain in right knee: Secondary | ICD-10-CM | POA: Diagnosis not present

## 2023-09-30 DIAGNOSIS — R2689 Other abnormalities of gait and mobility: Secondary | ICD-10-CM | POA: Diagnosis not present

## 2023-10-06 DIAGNOSIS — M6281 Muscle weakness (generalized): Secondary | ICD-10-CM | POA: Diagnosis not present

## 2023-10-06 DIAGNOSIS — R2689 Other abnormalities of gait and mobility: Secondary | ICD-10-CM | POA: Diagnosis not present

## 2023-10-06 DIAGNOSIS — M25561 Pain in right knee: Secondary | ICD-10-CM | POA: Diagnosis not present

## 2023-10-06 DIAGNOSIS — M48061 Spinal stenosis, lumbar region without neurogenic claudication: Secondary | ICD-10-CM | POA: Diagnosis not present

## 2023-10-07 DIAGNOSIS — R2689 Other abnormalities of gait and mobility: Secondary | ICD-10-CM | POA: Diagnosis not present

## 2023-10-07 DIAGNOSIS — M6281 Muscle weakness (generalized): Secondary | ICD-10-CM | POA: Diagnosis not present

## 2023-10-07 DIAGNOSIS — M25561 Pain in right knee: Secondary | ICD-10-CM | POA: Diagnosis not present

## 2023-10-07 DIAGNOSIS — M48061 Spinal stenosis, lumbar region without neurogenic claudication: Secondary | ICD-10-CM | POA: Diagnosis not present

## 2023-10-08 ENCOUNTER — Telehealth: Payer: Self-pay | Admitting: Family Medicine

## 2023-10-08 ENCOUNTER — Other Ambulatory Visit (HOSPITAL_BASED_OUTPATIENT_CLINIC_OR_DEPARTMENT_OTHER): Payer: Self-pay | Admitting: Family Medicine

## 2023-10-08 ENCOUNTER — Ambulatory Visit (HOSPITAL_BASED_OUTPATIENT_CLINIC_OR_DEPARTMENT_OTHER)
Admission: RE | Admit: 2023-10-08 | Discharge: 2023-10-08 | Disposition: A | Discharge status: Home / Self Care | Payer: Medicare Other | Source: Ambulatory Visit | Attending: Family Medicine | Admitting: Family Medicine

## 2023-10-08 DIAGNOSIS — M4726 Other spondylosis with radiculopathy, lumbar region: Secondary | ICD-10-CM | POA: Diagnosis not present

## 2023-10-08 DIAGNOSIS — M4807 Spinal stenosis, lumbosacral region: Secondary | ICD-10-CM | POA: Diagnosis not present

## 2023-10-08 DIAGNOSIS — M5116 Intervertebral disc disorders with radiculopathy, lumbar region: Secondary | ICD-10-CM | POA: Diagnosis not present

## 2023-10-08 DIAGNOSIS — M5440 Lumbago with sciatica, unspecified side: Secondary | ICD-10-CM | POA: Insufficient documentation

## 2023-10-08 DIAGNOSIS — Z1231 Encounter for screening mammogram for malignant neoplasm of breast: Secondary | ICD-10-CM

## 2023-10-08 DIAGNOSIS — M48061 Spinal stenosis, lumbar region without neurogenic claudication: Secondary | ICD-10-CM | POA: Diagnosis not present

## 2023-10-08 NOTE — Telephone Encounter (Signed)
 Advised that she can call imaging and they could give her a copy of scan when it comes back.

## 2023-10-08 NOTE — Telephone Encounter (Signed)
 Copied from CRM [id number]. Topic: Medical Record Request - Provider/Facility Request >> Oct 08, 2023  2:28 PM Tiffany H wrote: Reason for CRM: Patient advised that she has an appointment with a neurosurgeon next week. Neurosurgery would like a copy of the scan before the appointment. Please assist.   [facility] Phone: [phone] Fax: [phone]  Referral: #[id number] Scheduled for 10/14/23 at 12:30PM.

## 2023-10-11 ENCOUNTER — Telehealth: Payer: Self-pay | Admitting: Emergency Medicine

## 2023-10-11 NOTE — Telephone Encounter (Signed)
 Copied from CRM [id number]. Topic: General - Other >> Oct 08, 2023  9:29 AM Howard Macho wrote: Reason for CRM: laurie from [facility] called stating the plan of care that was faxed back to them does not have the doctor signature on it. It was only two pages out of the seven that were faxed back CB [phone]

## 2023-10-12 DIAGNOSIS — R2689 Other abnormalities of gait and mobility: Secondary | ICD-10-CM | POA: Diagnosis not present

## 2023-10-12 DIAGNOSIS — M48061 Spinal stenosis, lumbar region without neurogenic claudication: Secondary | ICD-10-CM | POA: Diagnosis not present

## 2023-10-12 DIAGNOSIS — M25561 Pain in right knee: Secondary | ICD-10-CM | POA: Diagnosis not present

## 2023-10-12 DIAGNOSIS — M6281 Muscle weakness (generalized): Secondary | ICD-10-CM | POA: Diagnosis not present

## 2023-10-14 ENCOUNTER — Other Ambulatory Visit (HOSPITAL_COMMUNITY): Payer: Self-pay | Admitting: Neurosurgery

## 2023-10-14 DIAGNOSIS — M7138 Other bursal cyst, other site: Secondary | ICD-10-CM | POA: Diagnosis not present

## 2023-10-14 DIAGNOSIS — Z6841 Body Mass Index (BMI) 40.0 and over, adult: Secondary | ICD-10-CM | POA: Diagnosis not present

## 2023-10-14 DIAGNOSIS — M5416 Radiculopathy, lumbar region: Secondary | ICD-10-CM | POA: Diagnosis not present

## 2023-10-18 DIAGNOSIS — H5213 Myopia, bilateral: Secondary | ICD-10-CM | POA: Diagnosis not present

## 2023-10-18 DIAGNOSIS — H26493 Other secondary cataract, bilateral: Secondary | ICD-10-CM | POA: Diagnosis not present

## 2023-10-18 DIAGNOSIS — H524 Presbyopia: Secondary | ICD-10-CM | POA: Diagnosis not present

## 2023-10-18 DIAGNOSIS — H353131 Nonexudative age-related macular degeneration, bilateral, early dry stage: Secondary | ICD-10-CM | POA: Diagnosis not present

## 2023-10-18 DIAGNOSIS — H04123 Dry eye syndrome of bilateral lacrimal glands: Secondary | ICD-10-CM | POA: Diagnosis not present

## 2023-10-18 DIAGNOSIS — H40013 Open angle with borderline findings, low risk, bilateral: Secondary | ICD-10-CM | POA: Diagnosis not present

## 2023-10-21 DIAGNOSIS — M6281 Muscle weakness (generalized): Secondary | ICD-10-CM | POA: Diagnosis not present

## 2023-10-21 DIAGNOSIS — R2689 Other abnormalities of gait and mobility: Secondary | ICD-10-CM | POA: Diagnosis not present

## 2023-10-21 DIAGNOSIS — M48061 Spinal stenosis, lumbar region without neurogenic claudication: Secondary | ICD-10-CM | POA: Diagnosis not present

## 2023-10-21 DIAGNOSIS — M25561 Pain in right knee: Secondary | ICD-10-CM | POA: Diagnosis not present

## 2023-10-22 ENCOUNTER — Encounter: Payer: Self-pay | Admitting: Family Medicine

## 2023-10-26 DIAGNOSIS — R2689 Other abnormalities of gait and mobility: Secondary | ICD-10-CM | POA: Diagnosis not present

## 2023-10-26 DIAGNOSIS — M25561 Pain in right knee: Secondary | ICD-10-CM | POA: Diagnosis not present

## 2023-10-26 DIAGNOSIS — M48061 Spinal stenosis, lumbar region without neurogenic claudication: Secondary | ICD-10-CM | POA: Diagnosis not present

## 2023-10-26 DIAGNOSIS — M6281 Muscle weakness (generalized): Secondary | ICD-10-CM | POA: Diagnosis not present

## 2023-11-04 ENCOUNTER — Other Ambulatory Visit: Payer: Self-pay | Admitting: Family Medicine

## 2023-11-09 ENCOUNTER — Ambulatory Visit: Payer: Medicare Other

## 2023-11-09 ENCOUNTER — Ambulatory Visit (HOSPITAL_COMMUNITY)
Admission: RE | Admit: 2023-11-09 | Discharge: 2023-11-09 | Disposition: A | Discharge status: Home / Self Care | Payer: Medicare Other | Source: Ambulatory Visit | Attending: Neurosurgery | Admitting: Neurosurgery

## 2023-11-09 DIAGNOSIS — I4892 Unspecified atrial flutter: Secondary | ICD-10-CM

## 2023-11-09 DIAGNOSIS — M7138 Other bursal cyst, other site: Secondary | ICD-10-CM | POA: Insufficient documentation

## 2023-11-09 LAB — CUP PACEART REMOTE DEVICE CHECK
Battery Remaining Longevity: 118 mo
Battery Remaining Percentage: 89 %
Battery Voltage: 3.02 V
Brady Statistic RV Percent Paced: 99 %
Date Time Interrogation Session: 20250311020015
Implantable Lead Connection Status: 753985
Implantable Lead Implant Date: 20230609
Implantable Lead Location: 753860
Implantable Pulse Generator Implant Date: 20230609
Lead Channel Impedance Value: 480 Ohm
Lead Channel Pacing Threshold Amplitude: 0.625 V
Lead Channel Pacing Threshold Pulse Width: 0.5 ms
Lead Channel Sensing Intrinsic Amplitude: 12 mV
Lead Channel Setting Pacing Amplitude: 0.875
Lead Channel Setting Pacing Pulse Width: 0.5 ms
Lead Channel Setting Sensing Sensitivity: 4 mV
Pulse Gen Model: 1272
Pulse Gen Serial Number: 8068284

## 2023-11-15 ENCOUNTER — Ambulatory Visit (HOSPITAL_BASED_OUTPATIENT_CLINIC_OR_DEPARTMENT_OTHER)
Admission: RE | Admit: 2023-11-15 | Discharge: 2023-11-15 | Disposition: A | Discharge status: Home / Self Care | Payer: Medicare Other | Source: Ambulatory Visit | Attending: Family Medicine | Admitting: Family Medicine

## 2023-11-15 ENCOUNTER — Encounter (HOSPITAL_BASED_OUTPATIENT_CLINIC_OR_DEPARTMENT_OTHER): Payer: Self-pay

## 2023-11-15 DIAGNOSIS — Z1231 Encounter for screening mammogram for malignant neoplasm of breast: Secondary | ICD-10-CM | POA: Insufficient documentation

## 2023-11-23 ENCOUNTER — Ambulatory Visit: Payer: Medicare Other | Admitting: Adult Health

## 2023-11-23 ENCOUNTER — Telehealth: Payer: Self-pay | Admitting: *Deleted

## 2023-11-23 DIAGNOSIS — Z6841 Body Mass Index (BMI) 40.0 and over, adult: Secondary | ICD-10-CM | POA: Diagnosis not present

## 2023-11-23 DIAGNOSIS — M5416 Radiculopathy, lumbar region: Secondary | ICD-10-CM | POA: Diagnosis not present

## 2023-11-23 NOTE — Telephone Encounter (Signed)
   Pre-operative Risk Assessment    Patient Name: Tracy Miles  DOB: 03-Jun-1934 MRN: [id number]   Date of last office visit: 05/12/23 [name], PAC Date of next office visit: NONE   Request for Surgical Clearance    Procedure:   L3-4 MICRODISKECTOMY  Date of Surgery:  Clearance TBD                                Surgeon:  [facility] POOL Surgeon's Group or Practice Name:  [facility] Phone number:  [phone] Fax number:  [phone] ATTN: [place] [address]244   Type of Clearance Requested:   - Medical  - Pharmacy:  Hold Apixaban (Eliquis)     Type of Anesthesia:  General    Additional requests/questions:    Elpidio Anis   11/23/2023, 6:09 PM

## 2023-11-24 ENCOUNTER — Telehealth: Payer: Self-pay | Admitting: *Deleted

## 2023-11-24 NOTE — Telephone Encounter (Signed)
 Patient with diagnosis of PAF and hx of DVT on Eliquis for anticoagulation.    Procedure:   L3-4 MICRODISKECTOMY  Date of procedure: TBD   CHA2DS2-VASc Score = 6   This indicates a 9.7% annual risk of stroke. The patient's score is based upon: CHF History: 1 HTN History: 1 Diabetes History: 0 Stroke History: 0 Vascular Disease History: 1 Age Score: 2 Gender Score: 1    CrCl 74 mL/min Platelet count 243 K   Per office protocol, patient can hold Eliquis for 3 days prior to procedure.     **This guidance is not considered finalized until pre-operative APP has relayed final recommendations.**

## 2023-11-24 NOTE — Telephone Encounter (Signed)
   Name: Tracy Miles  DOB: Nov 30, 1933  MRN: [id number]  Primary Cardiologist: None   Preoperative team, please contact this patient and set up a phone call appointment for further preoperative risk assessment. Please obtain consent and complete medication review. Thank you for your help.  I confirm that guidance regarding antiplatelet and oral anticoagulation therapy has been completed and, if necessary, noted below.  Per office protocol, patient can hold Eliquis for 3 days prior to procedure.   I also confirmed the patient resides in the state of [place]. As per [facility] [employer] telemedicine laws, the patient must reside in the state in which the provider is licensed.   Ronney Asters, NP 11/24/2023, 9:19 AM [facility]

## 2023-11-24 NOTE — Telephone Encounter (Signed)
  Patient Consent for Virtual Visit         Tracy Miles has provided verbal consent on 11/24/2023 for a virtual visit (video or telephone).   CONSENT FOR VIRTUAL VISIT FOR:  Tracy Miles  By participating in this virtual visit I agree to the following:  I hereby voluntarily request, consent and authorize [facility] and its employed or contracted physicians, physician assistants, nurse practitioners or other licensed health care professionals (the Practitioner), to provide me with telemedicine health care services (the "Services") as deemed necessary by the treating Practitioner. I acknowledge and consent to receive the Services by the Practitioner via telemedicine. I understand that the telemedicine visit will involve communicating with the Practitioner through live audiovisual communication technology and the disclosure of certain medical information by electronic transmission. I acknowledge that I have been given the opportunity to request an in-person assessment or other available alternative prior to the telemedicine visit and am voluntarily participating in the telemedicine visit.  I understand that I have the right to withhold or withdraw my consent to the use of telemedicine in the course of my care at any time, without affecting my right to future care or treatment, and that the Practitioner or I may terminate the telemedicine visit at any time. I understand that I have the right to inspect all information obtained and/or recorded in the course of the telemedicine visit and may receive copies of available information for a reasonable fee.  I understand that some of the potential risks of receiving the Services via telemedicine include:  Delay or interruption in medical evaluation due to technological equipment failure or disruption; Information transmitted may not be sufficient (e.g. poor resolution of images) to allow for appropriate medical decision making by the  Practitioner; and/or  In rare instances, security protocols could fail, causing a breach of personal health information.  Furthermore, I acknowledge that it is my responsibility to provide information about my medical history, conditions and care that is complete and accurate to the best of my ability. I acknowledge that Practitioner's advice, recommendations, and/or decision may be based on factors not within their control, such as incomplete or inaccurate data provided by me or distortions of diagnostic images or specimens that may result from electronic transmissions. I understand that the practice of medicine is not an exact science and that Practitioner makes no warranties or guarantees regarding treatment outcomes. I acknowledge that a copy of this consent can be made available to me via my patient portal [facility] MyChart), or I can request a printed copy by calling the office of [facility].    I understand that my insurance will be billed for this visit.   I have read or had this consent read to me. I understand the contents of this consent, which adequately explains the benefits and risks of the Services being provided via telemedicine.  I have been provided ample opportunity to ask questions regarding this consent and the Services and have had my questions answered to my satisfaction. I give my informed consent for the services to be provided through the use of telemedicine in my medical care

## 2023-11-25 ENCOUNTER — Ambulatory Visit: Payer: Medicare Other | Admitting: Adult Health

## 2023-11-26 ENCOUNTER — Ambulatory Visit: Attending: General Practice

## 2023-11-26 DIAGNOSIS — Z01810 Encounter for preprocedural cardiovascular examination: Secondary | ICD-10-CM | POA: Diagnosis not present

## 2023-11-26 NOTE — Progress Notes (Signed)
 Virtual Visit via Telephone Note   Because of Tracy Miles co-morbid illnesses, she is at least at moderate risk for complications without adequate follow up.  This format is felt to be most appropriate for this patient at this time.  Due to technical limitations with video connection (technology), today's appointment will be conducted as an audio only telehealth visit, and Tracy Miles verbally agreed to proceed in this manner.   All issues noted in this document were discussed and addressed.  No physical exam could be performed with this format.  Evaluation Performed:  Preoperative cardiovascular risk assessment _____________   Date:  11/26/2023   Patient ID:  Tracy Miles, DOB Jan 29, 1934, MRN [id number] Patient Location:  Home Provider location:   Office  Primary Care Provider:  Bradd Canary, MD Primary Cardiologist:  None  Chief Complaint / Patient Profile   89 y.o. y/o female with a h/o hypertension, hyperlipidemia, DVT history, permanent A-fib, CHB status post AV node ablation, PPM, chronic CHF (diastolic), OSA, and morbid obesity who is pending L3-4 microdiscectomy and presents today for telephonic preoperative cardiovascular risk assessment.  History of Present Illness    Tracy Miles is a 89 y.o. female who presents via audio/video conferencing for a telehealth visit today.  Pt was last seen in cardiology clinic on 05/12/2023 by [name], PA-C.  At that time Tracy Miles was doing well.  The patient is now pending procedure as outlined above. Since her last visit, she tells me that she has had multiple cardioversions and eventually had an AV node ablation.  Has not had any issues with her heart rhythm since then.  No shortness of breath or chest pain.  She is having a lot of back pain and it affects her left leg.  Sometimes she feels that her leg gives out.  This has limited her walking.  She does use a walker to get around her home.  She lives at [facility].  She very much enjoys this community.  They have a rehab center on campus and she plans to attend after surgery.  She does meet minimal METS on the DASI.   Per office protocol, patient can hold Eliquis for 3 days prior to procedure. She can resume when medically safe to do so.   Past Medical History    Past Medical History:  Diagnosis Date   Allergy    Anemia    Aortic stenosis    Very mild   Benign paroxysmal positional vertigo 12/17/2013   Chicken pox as a child   Colon polyp    Coronary artery disease    DDD (degenerative disc disease) 11/16/2013   Hyperlipidemia    Hypertension    Iron deficiency anemia 11/13/2013   Mumps child and teenager   OA (osteoarthritis) 11/19/2013   S/p L TKR   Obesity    OSA on CPAP    Pancreatitis    post hysterectomy   Persistent atrial fibrillation (HCC)    Personal history of colonic polyps 02/25/2014   Personal history of DVT (deep vein thrombosis) X 2   "left"   Sleep apnea    Spinal stenosis    Past Surgical History:  Procedure Laterality Date   APPENDECTOMY     ATRIAL FIBRILLATION ABLATION N/A 04/25/2019   Procedure: ATRIAL FIBRILLATION ABLATION;  Surgeon: Hillis Range, MD;  Location: [facility] INVASIVE CV LAB;  Service: Cardiovascular;  Laterality: N/A;   AV NODE ABLATION N/A 03/23/2022   Procedure: AV NODE ABLATION;  Surgeon:  Lanier Prude, MD;  Location: [facility] INVASIVE CV LAB;  Service: Cardiovascular;  Laterality: N/A;   CARDIOVERSION N/A 08/09/2015   Procedure: CARDIOVERSION;  Surgeon: Jake Bathe, MD;  Location: [facility] ENDOSCOPY;  Service: Cardiovascular;  Laterality: N/A;   CARDIOVERSION N/A 09/30/2018   Procedure: CARDIOVERSION;  Surgeon: Lars Masson, MD;  Location: [facility] ENDOSCOPY;  Service: Cardiovascular;  Laterality: N/A;   CARDIOVERSION N/A 02/27/2019   Procedure: CARDIOVERSION;  Surgeon: Jodelle Red, MD;  Location: [facility] ENDOSCOPY;  Service: Cardiovascular;  Laterality: N/A;   CARDIOVERSION N/A  06/07/2019   Procedure: CARDIOVERSION;  Surgeon: Lars Masson, MD;  Location: [facility] ENDOSCOPY;  Service: Cardiovascular;  Laterality: N/A;   CATARACT EXTRACTION W/ INTRAOCULAR LENS  IMPLANT, BILATERAL Bilateral 2016   CYST REMOVAL HAND Bilateral 1990's   "played to much golf"   DILATION AND CURETTAGE OF UTERUS     EYE SURGERY     implantable loop recorder placement  11/20/2019   Medtronic Reveal Linq model LNQ 22 (model number [id number]) implanted in office by Dr Johney Frame for afib management   JOINT REPLACEMENT     LUMBAR LAMINECTOMY  40 yrs ago   "L3-4"   MENISCUS REPAIR Bilateral 2000 and 2010   PACEMAKER IMPLANT N/A 02/06/2022   Procedure: PACEMAKER IMPLANT;  Surgeon: Lanier Prude, MD;  Location: [facility] INVASIVE CV LAB;  Service: Cardiovascular;  Laterality: N/A;   PACEMAKER INSERTION     REPLACEMENT TOTAL KNEE Left 2013   SPINE SURGERY     TEE WITHOUT CARDIOVERSION N/A 04/24/2019   Procedure: TRANSESOPHAGEAL ECHOCARDIOGRAM (TEE);  Surgeon: Vesta Mixer, MD;  Location[facility] ENDOSCOPY;  Service: Cardiovascular;  Laterality: N/A;   TONSILECTOMY, ADENOIDECTOMY, BILATERAL MYRINGOTOMY AND TUBES  child   TOTAL ABDOMINAL HYSTERECTOMY  1995   had 2 tumors- benign   TUBAL LIGATION  89 years old    Allergies  Allergies  Allergen Reactions   Gabapentin Swelling    Eyes and Face   Lactose Intolerance (Gi) Other (See Comments)    Bothers her stomach   Penicillins Hives and Rash    Did it involve swelling of the face/tongue/throat, SOB, or low BP? No Did it involve sudden or severe rash/hives, skin peeling, or any reaction on the inside of your mouth or nose? Yes Did you need to seek medical attention at a hospital or doctor's office? No When did it last happen?       25+ years If all above answers are "NO", may proceed with cephalosporin use.  Hives in throat     Sulfa Antibiotics Nausea Only and Rash   Vancomycin Itching   Zebeta [Bisoprolol Fumarate] Nausea Only    Home  Medications    Prior to Admission medications   Medication Sig Start Date End Date Taking? Authorizing Provider  acetaminophen (TYLENOL) 325 MG tablet Take 325 mg by mouth every 4 (four) hours as needed.    [provider]  amLODipine (NORVASC) 2.5 MG tablet Take 1 tablet (2.5 mg total) by mouth daily. 08/17/23   Bradd Canary, MD  atorvastatin (LIPITOR) 20 MG tablet Take 1 tablet (20 mg total) by mouth daily. 09/14/23   Bradd Canary, MD  bumetanide (BUMEX) 1 MG tablet TAKE 1 TABLET BY MOUTH DAILY 06/28/23   Bradd Canary, MD  Calcium Carb-Cholecalciferol (CALCIUM 600 + D PO) Take 1 tablet by mouth 2 (two) times daily.    [provider]  Carboxymethylcellul-Glycerin (LUBRICATING EYE DROPS OP) Place 1 drop into both eyes  in the morning and at bedtime.    [provider]  ELIQUIS 5 MG TABS tablet TAKE ONE TABLET EVERY MORNING AND AT BEDTIME 06/14/23   Bradd Canary, MD  lisinopril (ZESTRIL) 10 MG tablet Take 1 tablet (10 mg total) by mouth daily. 11/04/23   Bradd Canary, MD  loratadine (CLARITIN) 10 MG tablet Take 10 mg by mouth daily.     [provider]  Multiple Vitamins-Minerals (ONE-A-DAY WOMENS 50 PLUS PO) Take 1 tablet by mouth daily.    [provider]  potassium chloride (KLOR-CON) 10 MEQ tablet TAKE 1 TABLET BY MOUTH IN  THE MORNING AND 2 TABLETS  BY MOUTH IN THE EVENING 05/12/23   Sheilah Pigeon, PA-C  Probiotic CAPS Take 1 capsule by mouth daily. Gummy    [provider]  tiZANidine (ZANAFLEX) 2 MG tablet Take 0.5-2 tablets (1-4 mg total) by mouth every 8 (eight) hours as needed for muscle spasms. 09/28/23   Bradd Canary, MD  traMADol (ULTRAM) 50 MG tablet Take 50 mg by mouth every 6 (six) hours as needed for moderate pain (pain score 4-6).    [provider]    Physical Exam    Vital Signs:  Tracy Miles does not have vital signs available for review today.  Given telephonic nature of communication,  physical exam is limited. AAOx3. NAD. Normal affect.  Speech and respirations are unlabored.  Accessory Clinical Findings    None  Assessment & Plan    1.  Preoperative Cardiovascular Risk Assessment:  Tracy Miles's perioperative risk of a major cardiac event is 0.9% according to the Revised Cardiac Risk Index (RCRI).  Therefore, she is at low risk for perioperative complications.   Her functional capacity is fair at 4.73 METs according to the Duke Activity Status Index (DASI). Recommendations: According to ACC/AHA guidelines, no further cardiovascular testing needed.  The patient may proceed to surgery at acceptable risk.   Antiplatelet and/or Anticoagulation Recommendations:  Eliquis (Apixaban) can be held for 3 days prior to surgery.  Please resume post op when felt to be safe.     The patient was advised that if she develops new symptoms prior to surgery to contact our office to arrange for a follow-up visit, and she verbalized understanding.   A copy of this note will be routed to requesting surgeon.  Time:   Today, I have spent 7 minutes with the patient with telehealth technology discussing medical history, symptoms, and management plan.     Sharlene Dory, PA-C  11/26/2023, 12:34 PM

## 2023-11-29 ENCOUNTER — Other Ambulatory Visit: Payer: Self-pay | Admitting: Neurosurgery

## 2023-12-01 ENCOUNTER — Telehealth: Payer: Self-pay | Admitting: Emergency Medicine

## 2023-12-01 ENCOUNTER — Telehealth: Payer: Self-pay | Admitting: Family Medicine

## 2023-12-01 NOTE — Progress Notes (Signed)
 Surgical Instructions   Your procedure is scheduled on Friday, April 4th, 2025. Report to [facility] Main Entrance "A" at 730 A.M., then check in with the Admitting office. Any questions or running late day of surgery: call [phone]  Questions prior to your surgery date: call [phone], Monday-Friday, 8am-4pm. If you experience any cold or flu symptoms such as cough, fever, chills, shortness of breath, etc. between now and your scheduled surgery, please notify us at the above number.     Remember:  Do not eat or drink after midnight the night before your surgery    Take these medicines the morning of surgery with A SIP OF WATER: Amlodipine (Norvasc) Atorvastatin (Lipitor) Eye drops Loratadine (Claritin)   May take these medicines IF NEEDED: Tramadol (Ultram) Tizanidine (Zanaflex) Acetaminophen (Tylenol)   Per your cardiologist, Eliquis should be held for 3 days prior to your surgery.   One week prior to surgery, STOP taking any Aspirin (unless otherwise instructed by your surgeon) Aleve, Naproxen, Ibuprofen, Motrin, Advil, Goody's, BC's, all herbal medications, fish oil, and non-prescription vitamins.                     Do NOT Smoke (Tobacco/Vaping) for 24 hours prior to your procedure.  If you use a CPAP at night, you may bring your mask/headgear for your overnight stay.   You will be asked to remove any contacts, glasses, piercing's, hearing aid's, dentures/partials prior to surgery. Please bring cases for these items if needed.    Patients discharged the day of surgery will not be allowed to drive home, and someone needs to stay with them for 24 hours.  SURGICAL WAITING ROOM VISITATION Patients may have no more than 2 support people in the waiting area - these visitors may rotate.   Pre-op nurse will coordinate an appropriate time for 1 ADULT support person, who may not rotate, to accompany patient in pre-op.  Children under the age of 67 must have an adult  with them who is not the patient and must remain in the main waiting area with an adult.  If the patient needs to stay at the hospital during part of their recovery, the visitor guidelines for inpatient rooms apply.  Please refer to the [facility] website for the visitor guidelines for any additional information.   If you received a COVID test during your pre-op visit  it is requested that you wear a mask when out in public, stay away from anyone that may not be feeling well and notify your surgeon if you develop symptoms. If you have been in contact with anyone that has tested positive in the last 10 days please notify you surgeon.      Pre-operative 5 CHG Bathing Instructions   You can play a key role in reducing the risk of infection after surgery. Your skin needs to be as free of germs as possible. You can reduce the number of germs on your skin by washing with CHG (chlorhexidine gluconate) soap before surgery. CHG is an antiseptic soap that kills germs and continues to kill germs even after washing.   DO NOT use if you have an allergy to chlorhexidine/CHG or antibacterial soaps. If your skin becomes reddened or irritated, stop using the CHG and notify one of our RNs at [phone].   Please shower with the CHG soap starting 4 days before surgery using the following schedule:     Please keep in mind the following:  DO NOT shave, including  legs and underarms, starting the day of your first shower.   You may shave your face at any point before/day of surgery.  Place clean sheets on your bed the day you start using CHG soap. Use a clean washcloth (not used since being washed) for each shower. DO NOT sleep with pets once you start using the CHG.   CHG Shower Instructions:  Wash your face and private area with normal soap. If you choose to wash your hair, wash first with your normal shampoo.  After you use shampoo/soap, rinse your hair and body thoroughly to remove shampoo/soap  residue.  Turn the water OFF and apply about 3 tablespoons (45 ml) of CHG soap to a CLEAN washcloth.  Apply CHG soap ONLY FROM YOUR NECK DOWN TO YOUR TOES (washing for 3-5 minutes)  DO NOT use CHG soap on face, private areas, open wounds, or sores.  Pay special attention to the area where your surgery is being performed.  If you are having back surgery, having someone wash your back for you may be helpful. Wait 2 minutes after CHG soap is applied, then you may rinse off the CHG soap.  Pat dry with a clean towel  Put on clean clothes/pajamas   If you choose to wear lotion, please use ONLY the CHG-compatible lotions that are listed below.  Additional instructions for the day of surgery: DO NOT APPLY any lotions, deodorants, cologne, or perfumes.   Do not bring valuables to the hospital. [facility] is not responsible for any belongings/valuables. Do not wear nail polish, gel polish, artificial nails, or any other type of covering on natural nails (fingers and toes) Do not wear jewelry or makeup Put on clean/comfortable clothes.  Please brush your teeth.  Ask your nurse before applying any prescription medications to the skin.     CHG Compatible Lotions   Aveeno Moisturizing lotion  Cetaphil Moisturizing Cream  Cetaphil Moisturizing Lotion  Clairol Herbal Essence Moisturizing Lotion, Dry Skin  Clairol Herbal Essence Moisturizing Lotion, Extra Dry Skin  Clairol Herbal Essence Moisturizing Lotion, Normal Skin  Curel Age Defying Therapeutic Moisturizing Lotion with Alpha Hydroxy  Curel Extreme Care Body Lotion  Curel Soothing Hands Moisturizing Hand Lotion  Curel Therapeutic Moisturizing Cream, Fragrance-Free  Curel Therapeutic Moisturizing Lotion, Fragrance-Free  Curel Therapeutic Moisturizing Lotion, Original Formula  Eucerin Daily Replenishing Lotion  Eucerin Dry Skin Therapy Plus Alpha Hydroxy Crme  Eucerin Dry Skin Therapy Plus Alpha Hydroxy Lotion  Eucerin Original Crme   Eucerin Original Lotion  Eucerin Plus Crme Eucerin Plus Lotion  Eucerin TriLipid Replenishing Lotion  Keri Anti-Bacterial Hand Lotion  Keri Deep Conditioning Original Lotion Dry Skin Formula Softly Scented  Keri Deep Conditioning Original Lotion, Fragrance Free Sensitive Skin Formula  Keri Lotion Fast Absorbing Fragrance Free Sensitive Skin Formula  Keri Lotion Fast Absorbing Softly Scented Dry Skin Formula  Keri Original Lotion  Keri Skin Renewal Lotion Keri Silky Smooth Lotion  Keri Silky Smooth Sensitive Skin Lotion  Nivea Body Creamy Conditioning Oil  Nivea Body Extra Enriched Lotion  Nivea Body Original Lotion  Nivea Body Sheer Moisturizing Lotion Nivea Crme  Nivea Skin Firming Lotion  NutraDerm 30 Skin Lotion  NutraDerm Skin Lotion  NutraDerm Therapeutic Skin Cream  NutraDerm Therapeutic Skin Lotion  ProShield Protective Hand Cream  Provon moisturizing lotion  Please read over the following fact sheets that you were given.

## 2023-12-01 NOTE — Telephone Encounter (Signed)
 Copied from CRM [id number]. Topic: General - Other >> Dec 01, 2023  1:50 PM Gurney Maxin H wrote: Reason for CRM: Patient calling to notify Dr. Abner Greenspan that she is having back surgery on Friday 4/4 at 9:30 am at [facility], patient states Dr. Jordan Likes wanted her to let Dr. Abner Greenspan know, thanks.

## 2023-12-01 NOTE — Telephone Encounter (Signed)
 Copied from CRM [id number]. Topic: General - Other >> Dec 01, 2023  1:17 PM Kathryne Eriksson wrote: Reason for CRM: [facility] Request

## 2023-12-01 NOTE — Telephone Encounter (Signed)
 Called patient and she said she has already made arrangements with [employer] after her surgery. Please disregard the [facility] request

## 2023-12-02 ENCOUNTER — Encounter (HOSPITAL_COMMUNITY): Payer: Self-pay

## 2023-12-02 ENCOUNTER — Other Ambulatory Visit: Payer: Self-pay

## 2023-12-02 ENCOUNTER — Encounter (HOSPITAL_COMMUNITY)
Admission: RE | Admit: 2023-12-02 | Discharge: 2023-12-02 | Disposition: A | Discharge status: Home / Self Care | Source: Ambulatory Visit | Attending: Neurosurgery | Admitting: Neurosurgery

## 2023-12-02 ENCOUNTER — Telehealth: Payer: Self-pay | Admitting: Emergency Medicine

## 2023-12-02 VITALS — BP 143/63 | HR 72 | Temp 98.4°F | Resp 18 | Ht 63.0 in | Wt 281.9 lb

## 2023-12-02 DIAGNOSIS — E739 Lactose intolerance, unspecified: Secondary | ICD-10-CM | POA: Diagnosis present

## 2023-12-02 DIAGNOSIS — Z7901 Long term (current) use of anticoagulants: Secondary | ICD-10-CM | POA: Diagnosis not present

## 2023-12-02 DIAGNOSIS — I5032 Chronic diastolic (congestive) heart failure: Secondary | ICD-10-CM | POA: Diagnosis present

## 2023-12-02 DIAGNOSIS — I4819 Other persistent atrial fibrillation: Secondary | ICD-10-CM | POA: Diagnosis present

## 2023-12-02 DIAGNOSIS — I2510 Atherosclerotic heart disease of native coronary artery without angina pectoris: Secondary | ICD-10-CM | POA: Diagnosis present

## 2023-12-02 DIAGNOSIS — M7138 Other bursal cyst, other site: Secondary | ICD-10-CM | POA: Diagnosis not present

## 2023-12-02 DIAGNOSIS — M5416 Radiculopathy, lumbar region: Secondary | ICD-10-CM | POA: Diagnosis not present

## 2023-12-02 DIAGNOSIS — Z01818 Encounter for other preprocedural examination: Secondary | ICD-10-CM

## 2023-12-02 DIAGNOSIS — Z881 Allergy status to other antibiotic agents status: Secondary | ICD-10-CM | POA: Diagnosis not present

## 2023-12-02 DIAGNOSIS — Z01812 Encounter for preprocedural laboratory examination: Secondary | ICD-10-CM | POA: Insufficient documentation

## 2023-12-02 DIAGNOSIS — E785 Hyperlipidemia, unspecified: Secondary | ICD-10-CM | POA: Insufficient documentation

## 2023-12-02 DIAGNOSIS — Z96652 Presence of left artificial knee joint: Secondary | ICD-10-CM | POA: Diagnosis present

## 2023-12-02 DIAGNOSIS — G4733 Obstructive sleep apnea (adult) (pediatric): Secondary | ICD-10-CM | POA: Insufficient documentation

## 2023-12-02 DIAGNOSIS — Z90710 Acquired absence of both cervix and uterus: Secondary | ICD-10-CM | POA: Diagnosis not present

## 2023-12-02 DIAGNOSIS — M5116 Intervertebral disc disorders with radiculopathy, lumbar region: Secondary | ICD-10-CM | POA: Diagnosis present

## 2023-12-02 DIAGNOSIS — Z8249 Family history of ischemic heart disease and other diseases of the circulatory system: Secondary | ICD-10-CM | POA: Diagnosis not present

## 2023-12-02 DIAGNOSIS — I110 Hypertensive heart disease with heart failure: Secondary | ICD-10-CM | POA: Insufficient documentation

## 2023-12-02 DIAGNOSIS — Z950 Presence of cardiac pacemaker: Secondary | ICD-10-CM | POA: Diagnosis not present

## 2023-12-02 DIAGNOSIS — Z83438 Family history of other disorder of lipoprotein metabolism and other lipidemia: Secondary | ICD-10-CM | POA: Diagnosis not present

## 2023-12-02 DIAGNOSIS — Z882 Allergy status to sulfonamides status: Secondary | ICD-10-CM | POA: Diagnosis not present

## 2023-12-02 DIAGNOSIS — M48061 Spinal stenosis, lumbar region without neurogenic claudication: Secondary | ICD-10-CM | POA: Diagnosis present

## 2023-12-02 DIAGNOSIS — Z86718 Personal history of other venous thrombosis and embolism: Secondary | ICD-10-CM | POA: Insufficient documentation

## 2023-12-02 DIAGNOSIS — Z833 Family history of diabetes mellitus: Secondary | ICD-10-CM | POA: Diagnosis not present

## 2023-12-02 DIAGNOSIS — Z880 Allergy status to penicillin: Secondary | ICD-10-CM | POA: Diagnosis not present

## 2023-12-02 DIAGNOSIS — Z807 Family history of other malignant neoplasms of lymphoid, hematopoietic and related tissues: Secondary | ICD-10-CM | POA: Diagnosis not present

## 2023-12-02 DIAGNOSIS — Z888 Allergy status to other drugs, medicaments and biological substances status: Secondary | ICD-10-CM | POA: Diagnosis not present

## 2023-12-02 DIAGNOSIS — Z825 Family history of asthma and other chronic lower respiratory diseases: Secondary | ICD-10-CM | POA: Diagnosis not present

## 2023-12-02 DIAGNOSIS — Z823 Family history of stroke: Secondary | ICD-10-CM | POA: Diagnosis not present

## 2023-12-02 DIAGNOSIS — Z0189 Encounter for other specified special examinations: Secondary | ICD-10-CM | POA: Diagnosis not present

## 2023-12-02 DIAGNOSIS — Z961 Presence of intraocular lens: Secondary | ICD-10-CM | POA: Diagnosis present

## 2023-12-02 HISTORY — DX: Nonrheumatic mitral (valve) insufficiency: I34.0

## 2023-12-02 HISTORY — DX: Presence of cardiac pacemaker: Z95.0

## 2023-12-02 HISTORY — DX: Cardiac arrhythmia, unspecified: I49.9

## 2023-12-02 LAB — BASIC METABOLIC PANEL WITH GFR
Anion gap: 10 (ref 5–15)
BUN: 16 mg/dL (ref 8–23)
CO2: 29 mmol/L (ref 22–32)
Calcium: 9.5 mg/dL (ref 8.9–10.3)
Chloride: 100 mmol/L (ref 98–111)
Creatinine, Ser: 1.28 mg/dL — ABNORMAL HIGH (ref 0.44–1.00)
GFR, Estimated: 41 mL/min — ABNORMAL LOW (ref >60)
Glucose, Bld: 114 mg/dL — ABNORMAL HIGH (ref 70–99)
Potassium: 4.1 mmol/L (ref 3.5–5.1)
Sodium: 139 mmol/L (ref 135–145)

## 2023-12-02 LAB — CBC
HCT: 39.9 % (ref 36.0–46.0)
Hemoglobin: 13.3 g/dL (ref 12.0–15.0)
MCH: 32.5 pg (ref 26.0–34.0)
MCHC: 33.3 g/dL (ref 30.0–36.0)
MCV: 97.6 fL (ref 80.0–100.0)
Platelets: 245 10*3/uL (ref 150–400)
RBC: 4.09 MIL/uL (ref 3.87–5.11)
RDW: 12.6 % (ref 11.5–15.5)
WBC: 5.6 10*3/uL (ref 4.0–10.5)
nRBC: 0 % (ref 0.0–0.2)

## 2023-12-02 LAB — SURGICAL PCR SCREEN
MRSA, PCR: NEGATIVE
Staphylococcus aureus: NEGATIVE

## 2023-12-02 NOTE — Telephone Encounter (Signed)
 Copied from CRM [id number]. Topic: General - Other >> Dec 01, 2023  1:17 PM Kathryne Eriksson wrote: Reason for CRM: [facility] Request

## 2023-12-02 NOTE — Progress Notes (Signed)
 PERIOPERATIVE PRESCRIPTION FOR IMPLANTED CARDIAC DEVICE PROGRAMMING  Patient Information: Name:  Tracy Miles  DOB:  04/17/1934  MRN:  [id number]    Planned Procedure: Microdiscectomy - lumbar three - lumbar four  Surgeon:  Dr. Julio Sicks  Date of Procedure:  12/03/23  Cautery will be used.  Position during surgery:  prone   Device Information:  Clinic EP Physician:  Steffanie Dunn, MD   Device Type:  Pacemaker Manufacturer and Phone #:  [employer]: [phone] Pacemaker Dependent?:  Yes.   Date of Last Device Check:  11/09/23 remote  05/12/2023 IN OFFICE Normal Device Function?:  Yes.    Electrophysiologist's Recommendations:  Have magnet available. Provide continuous ECG monitoring when magnet is used or reprogramming is to be performed.  Procedure will likely interfere with device function.  Device should be programmed:  Asynchronous pacing during procedure and returned to normal programming after procedure  Per Device Clinic Standing Orders, Kizzie Ide, RN  5:08 PM 12/02/2023

## 2023-12-02 NOTE — Progress Notes (Signed)
 Anesthesia Chart Review:  Case: [id number] Date/Time: 12/03/23 0936   Procedure: LUMBAR LAMINECTOMY/DECOMPRESSION MICRODISCECTOMY 1 LEVEL (Left: Back) - Microdiscectomy - left - L3-L4   Anesthesia type: General   Diagnosis: Lumbar radiculopathy [M54.16]   Pre-op diagnosis: Lumbar radiculopathy   [facility]OM [facility] / [facility]   Surgeons: Julio Sicks, MD       DISCUSSION: Patient is an 89 year old female scheduled for the above procedure.  History includes never smoker, HTN, HLD, CAD (coronary calcifications 03/31/19 CT), afib (DCCV 1995, 2015, 2016 & 2020; s/p afib ablation 04/25/19 & 03/23/22), PPM (single chamber St. Jude/Abbott 1272 Assurity MRI PPM 02/06/22), chronic diastolic CHF, DVT, OSA (uses BiPAP), BPPV, IDA, hysterectomy (with post-op pancreatitis 1995), spinal surgery (L5-S1 fusion). AS is listed in history, but no AS noted on echocardiograms in [facility] from 10/2013-12/2021--she does have moderate MR/TR by 01/12/22 TTE. She moved to [facility] fro [facility] ~ 2015. She currently resides at [facility].  She had preoperative cardiology telephonic evaluation on 11/26/23 by Jari Favre, PA-C. She wrote, "Preoperative Cardiovascular Risk Assessment:   Ms. Fleet's perioperative risk of a major cardiac event is 0.9% according to the Revised Cardiac Risk Index (RCRI).  Therefore, she is at low risk for perioperative complications.   Her functional capacity is fair at 4.73 METs according to the Duke Activity Status Index (DASI). Recommendations: According to ACC/AHA guidelines, no further cardiovascular testing needed.  The patient may proceed to surgery at acceptable risk.   Antiplatelet and/or Anticoagulation Recommendations:   Eliquis (Apixaban) can be held for 3 days prior to surgery.  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 She had excellent BiPAP compliance. No significant clinical symptoms of ILD. She was doing pool exercises. Energy and activity tolerance much better after PPM and AV nodal ablation in 2023. One year follow-up planned.    Last Eliquis 11/29/23 AM.   Anesthesia team to evaluate on the day of sugery.   VS: BP (!) 143/63   Pulse 72   Temp 36.9 C   Resp 18   Ht 5\' 3"  (1.6 m)   Wt 127.9 kg   SpO2 99%   BMI 49.94 kg/m   PROVIDERS: Bradd Canary, MD is PCP  Steffanie Dunn, MD is EP cardiologist Rubye Oaks, MD is pulmonology provider Craige Cotta, Laurier Nancy, MD is now with [facility])   LABS: Labs reviewed: Acceptable for surgery. (all labs ordered are listed, but only abnormal results are displayed)  Labs Reviewed  BASIC METABOLIC PANEL WITH GFR - Abnormal; Notable for the following components:      Result Value   Glucose, Bld 114 (*)    Creatinine, Ser 1.28 (*)    GFR, Estimated 41 (*)    All other components within normal limits  SURGICAL PCR SCREEN  CBC     IMAGES: MRI L-spine 11/09/23:  IMPRESSION: 1. Interbody fusion at L5-S1. Prior posterior decompression at L5-S1. 2. Disc bulge with left foraminal disc protrusion at L3-L4 results in severe left foraminal stenosis of the most severe canal stenosis and narrowing of the left. 3. Severe canal stenosis at L4-L5 secondary to disc bulging and facet arthropathy.    EKG: 05/12/23:  AFib, V paced Confirmed by [name] ([id number]) on 05/12/2023 2:30:00 PM   CV: Echo 01/12/22: IMPRESSIONS   1. Atrial fibrillation.  Left ventricular ejection fraction, by estimation, is 45 to 50%. The  left ventricle has mildly decreased function. Left ventricular endocardial  border not optimally defined to evaluate regional wall motion. There is  mild concentric left ventricular   hypertrophy. Left ventricular diastolic parameters are indeterminate.   2. Right ventricular systolic function is normal. The right ventricular  size is  normal. There is normal pulmonary artery systolic pressure.   3. Left atrial size was moderately dilated.   4. The mitral valve is degenerative. Moderate mitral valve regurgitation.  No evidence of mitral stenosis. Moderate mitral annular calcification.   5. The aortic valve has an indeterminant number of cusps. Aortic valve  regurgitation is not visualized. Aortic valve sclerosis is present, with  no evidence of aortic valve stenosis.   6. The inferior vena cava is normal in size with greater than 50%  respiratory variability, suggesting right atrial pressure of 3 mmHg.  - Comparison: moderate MR/TR, trivial AR 04/24/19 TEE; LVEF 45-50% 04/19/19 TTE; LVEF 55-60%, mild MR/TR 08/06/15    US Carotid 02/01/18: Final Interpretation:  - Right Carotid: Velocities in the right ICA are consistent with a 1-39%  stenosis.  - Left Carotid: Velocities in the left ICA are consistent with a 1-39%  stenosis.  - Vertebrals: Bilateral vertebral arteries demonstrate antegrade flow.  - Subclavians: Normal flow hemodynamics were seen in bilateral subclavian arteries.    Nuclear stress test 11/27/13: Overall Impression: - Low risk stress nuclear study with small area of breast attenuation artifact. No ischemia.  - LV Ejection Fraction: 75%.  LV Wall Motion:  NL LV Function; NL Wall Motion   Past Medical History:  Diagnosis Date   Allergy    Anemia    Aortic stenosis    Very mild; no AS on 01/12/22 TTE   Benign paroxysmal positional vertigo 12/17/2013   Chicken pox as a child   Colon polyp    Coronary artery disease    DDD (degenerative disc disease) 11/16/2013   Dysrhythmia    Hyperlipidemia    Hypertension    Iron deficiency anemia 11/13/2013   Mitral regurgitation    moderate MR 01/12/22 TTE   Mumps child and teenager   OA (osteoarthritis) 11/19/2013   S/p L TKR   Obesity    OSA on CPAP    Pancreatitis    post hysterectomy   Persistent atrial fibrillation (HCC)    Personal history of  colonic polyps 02/25/2014   Personal history of DVT (deep vein thrombosis) X 2   "left"   Presence of permanent cardiac pacemaker    Sleep apnea    Spinal stenosis     Past Surgical History:  Procedure Laterality Date   APPENDECTOMY     ATRIAL FIBRILLATION ABLATION N/A 04/25/2019   Procedure: ATRIAL FIBRILLATION ABLATION;  Surgeon: Hillis Range, MD;  [facility];  Service: Cardiovascular;  Laterality: N/A;   AV NODE ABLATION N/A 03/23/2022   Procedure: AV NODE ABLATION;  Surgeon: Lanier Prude, MD;  [facility];  Service: Cardiovascular;  Laterality: N/A;   CARDIOVERSION N/A 08/09/2015   Procedure: CARDIOVERSION;  Surgeon: Jake Bathe, MD;  Location: [facility] ENDOSCOPY;  Service: Cardiovascular;  Laterality: N/A;   CARDIOVERSION N/A 09/30/2018   Procedure: CARDIOVERSION;  Surgeon: Lars Masson, MD;  Location: [facility] ENDOSCOPY;  Service: Cardiovascular;  Laterality: N/A;   CARDIOVERSION N/A 02/27/2019   Procedure: CARDIOVERSION;  Surgeon: Jodelle Red, MD;  Location: [facility] ENDOSCOPY;  Service: Cardiovascular;  Laterality: N/A;   CARDIOVERSION N/A 06/07/2019   Procedure: CARDIOVERSION;  Surgeon: Lars Masson, MD;  Location: [facility] ENDOSCOPY;  Service: Cardiovascular;  Laterality: N/A;   CATARACT EXTRACTION W/ INTRAOCULAR LENS  IMPLANT, BILATERAL Bilateral 2016   CYST REMOVAL HAND Bilateral 1990's   "played to much golf"   DILATION AND CURETTAGE OF UTERUS     EYE SURGERY     implantable loop recorder placement  11/20/2019   Medtronic Reveal Linq model LNQ 22 (model number [id number]) implanted in office by Dr Johney Frame for afib management   JOINT REPLACEMENT Left 2013   knee   LUMBAR LAMINECTOMY  40 yrs ago   "L3-4"   MENISCUS REPAIR Bilateral 2000 and 2010   PACEMAKER IMPLANT N/A 02/06/2022   Procedure: PACEMAKER IMPLANT;  Surgeon: Lanier Prude, MD;  [facility];  Service: Cardiovascular;  Laterality: N/A;    PACEMAKER INSERTION     REPLACEMENT TOTAL KNEE Left 2013   SPINE SURGERY     TEE WITHOUT CARDIOVERSION N/A 04/24/2019   Procedure: TRANSESOPHAGEAL ECHOCARDIOGRAM (TEE);  Surgeon: Vesta Mixer, MD;  Location: [facility] ENDOSCOPY;  Service: Cardiovascular;  Laterality: N/A;   TONSILECTOMY, ADENOIDECTOMY, BILATERAL MYRINGOTOMY AND TUBES  child   TOTAL ABDOMINAL HYSTERECTOMY  1995   had 2 tumors- benign   TUBAL LIGATION  89 years old    MEDICATIONS:  acetaminophen (TYLENOL) 500 MG tablet   amLODipine (NORVASC) 2.5 MG tablet   atorvastatin (LIPITOR) 20 MG tablet   bumetanide (BUMEX) 1 MG tablet   Calcium Carb-Cholecalciferol (CALCIUM 600 + D PO)   Carboxymethylcellul-Glycerin (LUBRICATING EYE DROPS OP)   ELIQUIS 5 MG TABS tablet   lisinopril (ZESTRIL) 10 MG tablet   loratadine (CLARITIN) 10 MG tablet   Multiple Vitamins-Minerals (ONE-A-DAY WOMENS 50 PLUS PO)   potassium chloride (KLOR-CON) 10 MEQ tablet   Probiotic CAPS   psyllium (REGULOID) 0.52 g capsule   tiZANidine (ZANAFLEX) 2 MG tablet   traMADol (ULTRAM) 50 MG tablet   No current facility-administered medications for this encounter.    Shonna Chock, PA-C Surgical Short Stay/Anesthesiology [facility] Phone [phone] [facility] Phone [phone] 12/02/2023 3:07 PM

## 2023-12-02 NOTE — Anesthesia Preprocedure Evaluation (Signed)
 Anesthesia Evaluation    Airway        Dental   Pulmonary           Cardiovascular hypertension,      Neuro/Psych    GI/Hepatic   Endo/Other    Renal/GU      Musculoskeletal   Abdominal   Peds  Hematology   Anesthesia Other Findings   Reproductive/Obstetrics                             Anesthesia Physical Anesthesia Plan  ASA:   Anesthesia Plan:    Post-op Pain Management:    Induction:   PONV Risk Score and Plan:   Airway Management Planned:   Additional Equipment:   Intra-op Plan:   Post-operative Plan:   Informed Consent:   Plan Discussed with:   Anesthesia Plan Comments: (PAT note written 12/02/2023 by Shonna Chock, PA-C.  )       Anesthesia Quick Evaluation  12/02/2023                PLT                      245                 12/02/2023              Anesthesia Other Findings   Reproductive/Obstetrics                             Anesthesia Physical Anesthesia Plan  ASA: 3  Anesthesia Plan: General   Post-op Pain Management: Celebrex PO (pre-op)* and Tylenol PO (pre-op)*   Induction: Intravenous  PONV Risk Score and Plan: 3 and Ondansetron, Dexamethasone and Treatment may vary due to age or medical condition  Airway Management Planned: Mask and Oral ETT  Additional Equipment: None  Intra-op Plan:   Post-operative Plan: Extubation in OR  Informed Consent: I have reviewed the patients History and Physical, chart, labs and discussed the procedure including the risks, benefits and alternatives for the proposed anesthesia with the patient or authorized representative who has indicated his/her understanding and acceptance.     Dental advisory given  Plan Discussed with: CRNA  Anesthesia Plan Comments: (PAT note written 12/02/2023 by Shonna Chock, PA-C.  )       Anesthesia Quick Evaluation

## 2023-12-02 NOTE — Progress Notes (Signed)
 PCP - Raliegh Scarlet Cardiologist - EP Cardiologist - Mallie Snooks  PPM/ICD - St Jude pacemaker Device Orders - requested in EPIC  Rep Notified - left voice mail for Kerry Fort  Chest x-ray - 02/08/22 EKG - 05/12/23 Stress Test - 12/09/13 ECHO - 01/12/22 Cardiac Cath - denies  Sleep Study - 7'25/16 CPAP - yes  Fasting Blood Sugar - na Checks Blood Sugar _____ times a day  Last dose of GLP1 agonist-  na GLP1 instructions: na  Blood Thinner Instructions:per cardiology,hold Eliquis 3 days prior to surgery. Pt reports her last dose was 11/29/23 am.  Aspirin Instructions:na  ERAS Protcol - no PRE-SURGERY Ensure or G2-   COVID TEST- na   Anesthesia review:yes- pt has pacemaker  Patient denies shortness of breath, fever, cough and chest pain at PAT appointment   All instructions explained to the patient, with a verbal understanding of the material. Patient agrees to go over the instructions while at home for a better understanding. Patient also instructed to wear a mask when out in public prior to surgery. The opportunity to ask questions was provided.

## 2023-12-03 ENCOUNTER — Ambulatory Visit (HOSPITAL_COMMUNITY): Payer: Self-pay | Admitting: Vascular Surgery

## 2023-12-03 ENCOUNTER — Inpatient Hospital Stay (HOSPITAL_COMMUNITY)
Admission: RE | Disposition: A | Discharge status: Skilled Nursing Facility | Payer: Self-pay | Source: Home / Self Care | Attending: Neurosurgery

## 2023-12-03 ENCOUNTER — Ambulatory Visit (HOSPITAL_COMMUNITY): Admitting: Anesthesiology

## 2023-12-03 ENCOUNTER — Inpatient Hospital Stay (HOSPITAL_COMMUNITY)
Admission: RE | Admit: 2023-12-03 | Discharge: 2023-12-05 | DRG: 519 | Disposition: A | Discharge status: Skilled Nursing Facility | Attending: Neurosurgery | Admitting: Neurosurgery

## 2023-12-03 ENCOUNTER — Encounter (HOSPITAL_COMMUNITY): Payer: Self-pay | Admitting: Neurosurgery

## 2023-12-03 ENCOUNTER — Telehealth: Payer: Self-pay | Admitting: Emergency Medicine

## 2023-12-03 ENCOUNTER — Telehealth: Payer: Self-pay | Admitting: Family Medicine

## 2023-12-03 ENCOUNTER — Other Ambulatory Visit: Payer: Self-pay

## 2023-12-03 ENCOUNTER — Ambulatory Visit (HOSPITAL_COMMUNITY)

## 2023-12-03 DIAGNOSIS — Z96652 Presence of left artificial knee joint: Secondary | ICD-10-CM | POA: Diagnosis present

## 2023-12-03 DIAGNOSIS — Z881 Allergy status to other antibiotic agents status: Secondary | ICD-10-CM

## 2023-12-03 DIAGNOSIS — M7138 Other bursal cyst, other site: Secondary | ICD-10-CM | POA: Diagnosis present

## 2023-12-03 DIAGNOSIS — Z90710 Acquired absence of both cervix and uterus: Secondary | ICD-10-CM

## 2023-12-03 DIAGNOSIS — I5032 Chronic diastolic (congestive) heart failure: Secondary | ICD-10-CM | POA: Diagnosis present

## 2023-12-03 DIAGNOSIS — I110 Hypertensive heart disease with heart failure: Secondary | ICD-10-CM | POA: Diagnosis present

## 2023-12-03 DIAGNOSIS — Z950 Presence of cardiac pacemaker: Secondary | ICD-10-CM

## 2023-12-03 DIAGNOSIS — Z0189 Encounter for other specified special examinations: Secondary | ICD-10-CM | POA: Diagnosis not present

## 2023-12-03 DIAGNOSIS — E785 Hyperlipidemia, unspecified: Secondary | ICD-10-CM | POA: Diagnosis present

## 2023-12-03 DIAGNOSIS — M5116 Intervertebral disc disorders with radiculopathy, lumbar region: Principal | ICD-10-CM | POA: Diagnosis present

## 2023-12-03 DIAGNOSIS — Z7901 Long term (current) use of anticoagulants: Secondary | ICD-10-CM

## 2023-12-03 DIAGNOSIS — M48061 Spinal stenosis, lumbar region without neurogenic claudication: Secondary | ICD-10-CM | POA: Diagnosis present

## 2023-12-03 DIAGNOSIS — Z882 Allergy status to sulfonamides status: Secondary | ICD-10-CM

## 2023-12-03 DIAGNOSIS — I2510 Atherosclerotic heart disease of native coronary artery without angina pectoris: Secondary | ICD-10-CM | POA: Diagnosis present

## 2023-12-03 DIAGNOSIS — Z833 Family history of diabetes mellitus: Secondary | ICD-10-CM

## 2023-12-03 DIAGNOSIS — M5416 Radiculopathy, lumbar region: Secondary | ICD-10-CM | POA: Diagnosis not present

## 2023-12-03 DIAGNOSIS — Z825 Family history of asthma and other chronic lower respiratory diseases: Secondary | ICD-10-CM

## 2023-12-03 DIAGNOSIS — I4819 Other persistent atrial fibrillation: Secondary | ICD-10-CM | POA: Diagnosis present

## 2023-12-03 DIAGNOSIS — E739 Lactose intolerance, unspecified: Secondary | ICD-10-CM | POA: Diagnosis present

## 2023-12-03 DIAGNOSIS — Z807 Family history of other malignant neoplasms of lymphoid, hematopoietic and related tissues: Secondary | ICD-10-CM

## 2023-12-03 DIAGNOSIS — Z79899 Other long term (current) drug therapy: Secondary | ICD-10-CM

## 2023-12-03 DIAGNOSIS — Z823 Family history of stroke: Secondary | ICD-10-CM

## 2023-12-03 DIAGNOSIS — Z961 Presence of intraocular lens: Secondary | ICD-10-CM | POA: Diagnosis present

## 2023-12-03 DIAGNOSIS — Z8249 Family history of ischemic heart disease and other diseases of the circulatory system: Secondary | ICD-10-CM

## 2023-12-03 DIAGNOSIS — Z880 Allergy status to penicillin: Secondary | ICD-10-CM

## 2023-12-03 DIAGNOSIS — Z86718 Personal history of other venous thrombosis and embolism: Secondary | ICD-10-CM

## 2023-12-03 DIAGNOSIS — Z83438 Family history of other disorder of lipoprotein metabolism and other lipidemia: Secondary | ICD-10-CM

## 2023-12-03 DIAGNOSIS — Z888 Allergy status to other drugs, medicaments and biological substances status: Secondary | ICD-10-CM

## 2023-12-03 HISTORY — PX: LUMBAR LAMINECTOMY/DECOMPRESSION MICRODISCECTOMY: SHX5026

## 2023-12-03 SURGERY — LUMBAR LAMINECTOMY/DECOMPRESSION MICRODISCECTOMY 1 LEVEL
Anesthesia: General | Site: Spine Lumbar | Laterality: Left

## 2023-12-03 MED ORDER — HYDROMORPHONE HCL 1 MG/ML IJ SOLN
INTRAMUSCULAR | Status: AC
Start: 2023-12-03 — End: 2023-12-04
  Filled 2023-12-03: qty 1

## 2023-12-03 MED ORDER — BUPIVACAINE HCL (PF) 0.25 % IJ SOLN
INTRAMUSCULAR | Status: AC
  Filled 2023-12-03: qty 30

## 2023-12-03 MED ORDER — EPHEDRINE SULFATE-NACL 50-0.9 MG/10ML-% IV SOSY
PREFILLED_SYRINGE | INTRAVENOUS | Status: DC | PRN
  Administered 2023-12-03: 5 mg via INTRAVENOUS

## 2023-12-03 MED ORDER — THROMBIN (RECOMBINANT) 5000 UNITS EX SOLR: CUTANEOUS | Status: DC | PRN

## 2023-12-03 MED ORDER — POTASSIUM CHLORIDE CRYS ER 10 MEQ PO TBCR
10.0000 meq | EXTENDED_RELEASE_TABLET | Freq: Two times a day (BID) | ORAL | Status: DC
  Administered 2023-12-03 – 2023-12-05 (×5): 10 meq via ORAL
  Filled 2023-12-03 (×6): qty 1

## 2023-12-03 MED ORDER — HYDROCODONE-ACETAMINOPHEN 10-325 MG PO TABS
2.0000 | ORAL_TABLET | ORAL | Status: DC | PRN
  Administered 2023-12-04 – 2023-12-05 (×2): 2 via ORAL
  Filled 2023-12-03 (×2): qty 2

## 2023-12-03 MED ORDER — MENTHOL 3 MG MT LOZG: 1.0000 | LOZENGE | OROMUCOSAL | Status: DC | PRN

## 2023-12-03 MED ORDER — FENTANYL CITRATE (PF) 250 MCG/5ML IJ SOLN
INTRAMUSCULAR | Status: AC
  Filled 2023-12-03: qty 5

## 2023-12-03 MED ORDER — FENTANYL CITRATE (PF) 250 MCG/5ML IJ SOLN
INTRAMUSCULAR | Status: DC | PRN
  Administered 2023-12-03 (×4): 25 ug via INTRAVENOUS
  Administered 2023-12-03 (×2): 50 ug via INTRAVENOUS

## 2023-12-03 MED ORDER — PHENOL 1.4 % MT LIQD: 1.0000 | OROMUCOSAL | Status: DC | PRN

## 2023-12-03 MED ORDER — THROMBIN 5000 UNITS EX KIT
PACK | CUTANEOUS | Status: AC
  Filled 2023-12-03: qty 2

## 2023-12-03 MED ORDER — OYSTER SHELL CALCIUM/D3 500-5 MG-MCG PO TABS
1.0000 | ORAL_TABLET | Freq: Two times a day (BID) | ORAL | Status: DC
  Administered 2023-12-03 – 2023-12-05 (×5): 1 via ORAL
  Filled 2023-12-03 (×5): qty 1

## 2023-12-03 MED ORDER — HYDROCODONE-ACETAMINOPHEN 5-325 MG PO TABS
1.0000 | ORAL_TABLET | ORAL | Status: DC | PRN
  Administered 2023-12-03 – 2023-12-05 (×4): 1 via ORAL
  Filled 2023-12-03 (×3): qty 1

## 2023-12-03 MED ORDER — ONDANSETRON HCL 4 MG PO TABS: 4.0000 mg | ORAL_TABLET | Freq: Four times a day (QID) | ORAL | Status: DC | PRN

## 2023-12-03 MED ORDER — ADULT MULTIVITAMIN W/MINERALS CH
1.0000 | ORAL_TABLET | Freq: Every day | ORAL | Status: DC
  Administered 2023-12-04 – 2023-12-05 (×2): 1 via ORAL
  Filled 2023-12-03 (×2): qty 1

## 2023-12-03 MED ORDER — POLYVINYL ALCOHOL 1.4 % OP SOLN
1.0000 [drp] | Freq: Two times a day (BID) | OPHTHALMIC | Status: DC
  Administered 2023-12-03 – 2023-12-05 (×4): 1 [drp] via OPHTHALMIC
  Filled 2023-12-03: qty 15

## 2023-12-03 MED ORDER — LIDOCAINE 2% (20 MG/ML) 5 ML SYRINGE
INTRAMUSCULAR | Status: AC
  Filled 2023-12-03: qty 5

## 2023-12-03 MED ORDER — RISAQUAD PO CAPS
1.0000 | ORAL_CAPSULE | Freq: Every day | ORAL | Status: DC
  Administered 2023-12-03 – 2023-12-05 (×3): 1 via ORAL
  Filled 2023-12-03 (×3): qty 1

## 2023-12-03 MED ORDER — LIDOCAINE 2% (20 MG/ML) 5 ML SYRINGE
INTRAMUSCULAR | Status: DC | PRN
  Administered 2023-12-03: 60 mg via INTRAVENOUS

## 2023-12-03 MED ORDER — CHLORHEXIDINE GLUCONATE CLOTH 2 % EX PADS: 6.0000 | MEDICATED_PAD | Freq: Once | CUTANEOUS | Status: DC

## 2023-12-03 MED ORDER — CHLORHEXIDINE GLUCONATE 0.12 % MT SOLN
15.0000 mL | Freq: Once | OROMUCOSAL | Status: AC
  Administered 2023-12-03: 15 mL via OROMUCOSAL
  Filled 2023-12-03: qty 15

## 2023-12-03 MED ORDER — HYDROMORPHONE HCL 1 MG/ML IJ SOLN
0.2500 mg | INTRAMUSCULAR | Status: DC | PRN
Start: 2023-12-03 — End: 2023-12-03
  Administered 2023-12-03 (×3): 0.5 mg via INTRAVENOUS

## 2023-12-03 MED ORDER — DEXAMETHASONE SODIUM PHOSPHATE 10 MG/ML IJ SOLN
INTRAMUSCULAR | Status: DC | PRN
  Administered 2023-12-03: 10 mg via INTRAVENOUS

## 2023-12-03 MED ORDER — LORATADINE 10 MG PO TABS
10.0000 mg | ORAL_TABLET | Freq: Every day | ORAL | Status: DC
  Administered 2023-12-04 – 2023-12-05 (×2): 10 mg via ORAL
  Filled 2023-12-03 (×2): qty 1

## 2023-12-03 MED ORDER — ORAL CARE MOUTH RINSE: 15.0000 mL | Freq: Once | OROMUCOSAL | Status: AC

## 2023-12-03 MED ORDER — KETOROLAC TROMETHAMINE 30 MG/ML IJ SOLN
INTRAMUSCULAR | Status: AC
  Filled 2023-12-03: qty 1

## 2023-12-03 MED ORDER — TIZANIDINE HCL 2 MG PO TABS
2.0000 mg | ORAL_TABLET | Freq: Three times a day (TID) | ORAL | Status: DC | PRN
  Administered 2023-12-05: 2 mg via ORAL
  Filled 2023-12-03 (×2): qty 1

## 2023-12-03 MED ORDER — 0.9 % SODIUM CHLORIDE (POUR BTL) OPTIME
TOPICAL | Status: DC | PRN
  Administered 2023-12-03: 1000 mL

## 2023-12-03 MED ORDER — SODIUM CHLORIDE 0.9% FLUSH: 3.0000 mL | INTRAVENOUS | Status: DC | PRN

## 2023-12-03 MED ORDER — KETOROLAC TROMETHAMINE 15 MG/ML IJ SOLN
7.5000 mg | Freq: Four times a day (QID) | INTRAMUSCULAR | Status: AC
  Administered 2023-12-03 – 2023-12-04 (×3): 7.5 mg via INTRAVENOUS
  Filled 2023-12-03 (×3): qty 1

## 2023-12-03 MED ORDER — ACETAMINOPHEN 650 MG RE SUPP: 650.0000 mg | RECTAL | Status: DC | PRN

## 2023-12-03 MED ORDER — HYDROMORPHONE HCL 1 MG/ML IJ SOLN: 1.0000 mg | INTRAMUSCULAR | Status: DC | PRN

## 2023-12-03 MED ORDER — ACETAMINOPHEN 325 MG PO TABS: 650.0000 mg | ORAL_TABLET | ORAL | Status: DC | PRN

## 2023-12-03 MED ORDER — PROPOFOL 10 MG/ML IV BOLUS
INTRAVENOUS | Status: DC | PRN
  Administered 2023-12-03: 150 mg via INTRAVENOUS

## 2023-12-03 MED ORDER — BUPIVACAINE HCL (PF) 0.25 % IJ SOLN
INTRAMUSCULAR | Status: DC | PRN
  Administered 2023-12-03: 20 mL

## 2023-12-03 MED ORDER — AMLODIPINE BESYLATE 2.5 MG PO TABS
2.5000 mg | ORAL_TABLET | Freq: Every day | ORAL | Status: DC
Start: 2023-12-04 — End: 2023-12-05
  Administered 2023-12-04 – 2023-12-05 (×2): 2.5 mg via ORAL
  Filled 2023-12-03 (×2): qty 1

## 2023-12-03 MED ORDER — SODIUM CHLORIDE 0.9 % IV SOLN: 250.0000 mL | INTRAVENOUS | Status: DC

## 2023-12-03 MED ORDER — ONDANSETRON HCL 4 MG/2ML IJ SOLN: 4.0000 mg | Freq: Four times a day (QID) | INTRAMUSCULAR | Status: DC | PRN

## 2023-12-03 MED ORDER — HYDROMORPHONE HCL 1 MG/ML IJ SOLN
INTRAMUSCULAR | Status: AC
  Filled 2023-12-03: qty 1

## 2023-12-03 MED ORDER — ACETAMINOPHEN 500 MG PO TABS: 1000.0000 mg | ORAL_TABLET | Freq: Once | ORAL | Status: DC

## 2023-12-03 MED ORDER — ROCURONIUM BROMIDE 10 MG/ML (PF) SYRINGE
PREFILLED_SYRINGE | INTRAVENOUS | Status: DC | PRN
  Administered 2023-12-03: 5 mg via INTRAVENOUS
  Administered 2023-12-03: 60 mg via INTRAVENOUS
  Administered 2023-12-03: 10 mg via INTRAVENOUS

## 2023-12-03 MED ORDER — HYDROCODONE-ACETAMINOPHEN 5-325 MG PO TABS
ORAL_TABLET | ORAL | Status: AC
  Filled 2023-12-03: qty 1

## 2023-12-03 MED ORDER — SUGAMMADEX SODIUM 200 MG/2ML IV SOLN
INTRAVENOUS | Status: DC | PRN
  Administered 2023-12-03: 350 mg via INTRAVENOUS

## 2023-12-03 MED ORDER — CEFAZOLIN SODIUM-DEXTROSE 3-4 GM/150ML-% IV SOLN
3.0000 g | INTRAVENOUS | Status: AC
  Administered 2023-12-03: 3 g via INTRAVENOUS
  Filled 2023-12-03: qty 150

## 2023-12-03 MED ORDER — PSYLLIUM 0.52 G PO CAPS: 1.0000 | ORAL_CAPSULE | Freq: Two times a day (BID) | ORAL | Status: DC

## 2023-12-03 MED ORDER — LACTATED RINGERS IV SOLN: INTRAVENOUS | Status: DC

## 2023-12-03 MED ORDER — ONDANSETRON HCL 4 MG/2ML IJ SOLN
INTRAMUSCULAR | Status: DC | PRN
  Administered 2023-12-03: 4 mg via INTRAVENOUS

## 2023-12-03 MED ORDER — ROCURONIUM BROMIDE 10 MG/ML (PF) SYRINGE
PREFILLED_SYRINGE | INTRAVENOUS | Status: AC
  Filled 2023-12-03: qty 10

## 2023-12-03 MED ORDER — ATORVASTATIN CALCIUM 10 MG PO TABS
20.0000 mg | ORAL_TABLET | Freq: Every day | ORAL | Status: DC
  Administered 2023-12-03 – 2023-12-04 (×2): 20 mg via ORAL
  Filled 2023-12-03 (×2): qty 2

## 2023-12-03 MED ORDER — ONDANSETRON HCL 4 MG/2ML IJ SOLN
INTRAMUSCULAR | Status: AC
  Filled 2023-12-03: qty 2

## 2023-12-03 MED ORDER — SODIUM CHLORIDE 0.9% FLUSH: 3.0000 mL | Freq: Two times a day (BID) | INTRAVENOUS | Status: DC

## 2023-12-03 MED ORDER — KETOROLAC TROMETHAMINE 30 MG/ML IJ SOLN
INTRAMUSCULAR | Status: DC | PRN
  Administered 2023-12-03: 15 mg via INTRAVENOUS

## 2023-12-03 MED ORDER — BUMETANIDE 1 MG PO TABS
1.0000 mg | ORAL_TABLET | Freq: Every day | ORAL | Status: DC
  Administered 2023-12-03 – 2023-12-05 (×3): 1 mg via ORAL
  Filled 2023-12-03 (×3): qty 1

## 2023-12-03 MED ORDER — LISINOPRIL 10 MG PO TABS
10.0000 mg | ORAL_TABLET | Freq: Every day | ORAL | Status: DC
  Administered 2023-12-03 – 2023-12-05 (×3): 10 mg via ORAL
  Filled 2023-12-03 (×3): qty 1

## 2023-12-03 MED ORDER — CELECOXIB 200 MG PO CAPS: 200.0000 mg | ORAL_CAPSULE | Freq: Once | ORAL | Status: DC

## 2023-12-03 MED ORDER — CEFAZOLIN SODIUM-DEXTROSE 1-4 GM/50ML-% IV SOLN
1.0000 g | Freq: Three times a day (TID) | INTRAVENOUS | Status: AC
  Administered 2023-12-03 – 2023-12-04 (×2): 1 g via INTRAVENOUS
  Filled 2023-12-03 (×2): qty 50

## 2023-12-03 MED ORDER — DEXAMETHASONE SODIUM PHOSPHATE 10 MG/ML IJ SOLN
INTRAMUSCULAR | Status: AC
  Filled 2023-12-03: qty 1

## 2023-12-03 SURGICAL SUPPLY — 40 items
BAG COUNTER SPONGE SURGICOUNT (BAG) ×1 IMPLANT
BAND RUBBER #18 3X1/16 STRL (MISCELLANEOUS) ×2 IMPLANT
BENZOIN TINCTURE PRP APPL 2/3 (GAUZE/BANDAGES/DRESSINGS) ×1 IMPLANT
BLADE CLIPPER SURG (BLADE) IMPLANT
BUR CUTTER 7.0 ROUND (BURR) ×1 IMPLANT
CANISTER SUCT 3000ML PPV (MISCELLANEOUS) ×1 IMPLANT
DERMABOND ADVANCED .7 DNX12 (GAUZE/BANDAGES/DRESSINGS) ×1 IMPLANT
DRAPE HALF SHEET 40X57 (DRAPES) IMPLANT
DRAPE LAPAROTOMY 100X72X124 (DRAPES) ×1 IMPLANT
DRAPE MICROSCOPE SLANT 54X150 (MISCELLANEOUS) ×1 IMPLANT
DRAPE SURG 17X23 STRL (DRAPES) ×2 IMPLANT
DRSG OPSITE POSTOP 4X6 (GAUZE/BANDAGES/DRESSINGS) IMPLANT
DURAPREP 26ML APPLICATOR (WOUND CARE) ×1 IMPLANT
ELECT REM PT RETURN 9FT ADLT (ELECTROSURGICAL) ×1 IMPLANT
ELECTRODE REM PT RTRN 9FT ADLT (ELECTROSURGICAL) ×1 IMPLANT
GAUZE 4X4 16PLY ~~LOC~~+RFID DBL (SPONGE) IMPLANT
GLOVE BIO SURGEON STRL SZ 6 (GLOVE) ×1 IMPLANT
GLOVE BIOGEL PI IND STRL 6.5 (GLOVE) ×1 IMPLANT
GLOVE ECLIPSE 9.0 STRL (GLOVE) ×1 IMPLANT
GLOVE EXAM NITRILE XL STR (GLOVE) IMPLANT
GOWN STRL REUS W/ TWL LRG LVL3 (GOWN DISPOSABLE) IMPLANT
GOWN STRL REUS W/ TWL XL LVL3 (GOWN DISPOSABLE) ×1 IMPLANT
GOWN STRL REUS W/TWL 2XL LVL3 (GOWN DISPOSABLE) IMPLANT
KIT BASIN OR (CUSTOM PROCEDURE TRAY) ×1 IMPLANT
KIT TURNOVER KIT B (KITS) ×1 IMPLANT
NDL HYPO 22X1.5 SAFETY MO (MISCELLANEOUS) ×1 IMPLANT
NDL SPNL 22GX3.5 QUINCKE BK (NEEDLE) IMPLANT
NEEDLE HYPO 22X1.5 SAFETY MO (MISCELLANEOUS) ×1 IMPLANT
NEEDLE SPNL 22GX3.5 QUINCKE BK (NEEDLE) IMPLANT
NS IRRIG 1000ML POUR BTL (IV SOLUTION) ×1 IMPLANT
PACK LAMINECTOMY NEURO (CUSTOM PROCEDURE TRAY) ×1 IMPLANT
PAD ARMBOARD POSITIONER FOAM (MISCELLANEOUS) ×3 IMPLANT
SPIKE FLUID TRANSFER (MISCELLANEOUS) ×1 IMPLANT
SPONGE SURGIFOAM ABS GEL SZ50 (HEMOSTASIS) ×1 IMPLANT
STRIP CLOSURE SKIN 1/2X4 (GAUZE/BANDAGES/DRESSINGS) ×1 IMPLANT
SUT VIC AB 2-0 CT1 18 (SUTURE) ×1 IMPLANT
SUT VIC AB 3-0 SH 8-18 (SUTURE) ×1 IMPLANT
TOWEL GREEN STERILE (TOWEL DISPOSABLE) ×1 IMPLANT
TOWEL GREEN STERILE FF (TOWEL DISPOSABLE) ×1 IMPLANT
WATER STERILE IRR 1000ML POUR (IV SOLUTION) ×1 IMPLANT

## 2023-12-03 NOTE — Telephone Encounter (Signed)
 Copied from CRM [id number]. Topic: Clinical - Medication Question >> Dec 02, 2023  4:29 PM Sonny Dandy B wrote: Reason for CRM Dru Siles called to request a FL2 form from the provider. States pt will be admitted to [facility] on 12/02/23 states they need it in order to admit the patient. She will be in the facility for rehab. Please call Dru at [phone], Fax [phone]

## 2023-12-03 NOTE — Telephone Encounter (Signed)
 Called Tracy Miles. No answer. LVM

## 2023-12-03 NOTE — Telephone Encounter (Signed)
 Copied from CRM [id number]. Topic: General - Other >> Dec 03, 2023  1:00 PM Fredrich Romans wrote: Reason for CRM: [facility] called to ask about a FL2 signed by provider.She said that there was a mix up from the patient stating that they do not need FL2 ,however they are needing along with medication list Fx number: [phone] Rehab

## 2023-12-03 NOTE — H&P (Signed)
 Tracy Miles is an 89 y.o. female.   Chief Complaint: Left leg pain HPI: 89 year old female with severe left lower extremity radicular pain consistent with a L3 and L4 radiculopathy.  Workup demonstrates evidence of a very large left-sided L3-4 disc herniation with superior migration and severe compression of the exiting left L3 nerve root and descending L4 nerve root as well as her thecal sac.  Patient also with some arthritic changes and a small synovial cyst associated with this.  Patient is status post remote L4-5 decompressive surgery where she has significant recurrent spinal stenosis but overall most of her symptoms seem to be coming from the L3-4 disc herniation and the plan is just to address the disc herniation.  Patient has no significant right-sided symptoms.  She is no new bowel or bladder dysfunction.   Past Medical History:  Diagnosis Date   Allergy    Anemia    Aortic stenosis    Very mild; no AS on 01/12/22 TTE   Benign paroxysmal positional vertigo 12/17/2013   Chicken pox as a child   Colon polyp    Coronary artery disease    DDD (degenerative disc disease) 11/16/2013   Dysrhythmia    Hyperlipidemia    Hypertension    Iron deficiency anemia 11/13/2013   Mitral regurgitation    moderate MR 01/12/22 TTE   Mumps child and teenager   OA (osteoarthritis) 11/19/2013   S/p L TKR   Obesity    OSA on CPAP    Pancreatitis    post hysterectomy   Persistent atrial fibrillation (HCC)    Personal history of colonic polyps 02/25/2014   Personal history of DVT (deep vein thrombosis) X 2   "left"   Presence of permanent cardiac pacemaker    Sleep apnea    Spinal stenosis     Past Surgical History:  Procedure Laterality Date   APPENDECTOMY     ATRIAL FIBRILLATION ABLATION N/A 04/25/2019   Procedure: ATRIAL FIBRILLATION ABLATION;  Surgeon: Hillis Range, MD;  [facility];  Service: Cardiovascular;  Laterality: N/A;   AV NODE ABLATION N/A 03/23/2022    Procedure: AV NODE ABLATION;  Surgeon: Lanier Prude, MD;  [facility] LAB;  Service: Cardiovascular;  Laterality: N/A;   CARDIOVERSION N/A 08/09/2015   Procedure: CARDIOVERSION;  Surgeon: Jake Bathe, MD;  Location: [facility] ENDOSCOPY;  Service: Cardiovascular;  Laterality: N/A;   CARDIOVERSION N/A 09/30/2018   Procedure: CARDIOVERSION;  Surgeon: Lars Masson, MD;  Location: [facility] ENDOSCOPY;  Service: Cardiovascular;  Laterality: N/A;   CARDIOVERSION N/A 02/27/2019   Procedure: CARDIOVERSION;  Surgeon: Jodelle Red, MD;  Location: [facility] ENDOSCOPY;  Service: Cardiovascular;  Laterality: N/A;   CARDIOVERSION N/A 06/07/2019   Procedure: CARDIOVERSION;  Surgeon: Lars Masson, MD;  Location: [facility] ENDOSCOPY;  Service: Cardiovascular;  Laterality: N/A;   CATARACT EXTRACTION W/ INTRAOCULAR LENS  IMPLANT, BILATERAL Bilateral 2016   CYST REMOVAL HAND Bilateral 1990's   "played to much golf"   DILATION AND CURETTAGE OF UTERUS     EYE SURGERY     implantable loop recorder placement  11/20/2019   Medtronic Reveal Linq model LNQ 22 (model number [id number]) implanted in office by Dr Johney Frame for afib management   JOINT REPLACEMENT Left 2013   knee   LUMBAR LAMINECTOMY  40 yrs ago   "L3-4"   MENISCUS REPAIR Bilateral 2000 and 2010   PACEMAKER IMPLANT N/A 02/06/2022   Procedure: PACEMAKER IMPLANT;  Surgeon: Lanier Prude, MD;  [facility];  Service: Cardiovascular;  Laterality: N/A;   PACEMAKER INSERTION     REPLACEMENT TOTAL KNEE Left 2013   SPINE SURGERY     TEE WITHOUT CARDIOVERSION N/A 04/24/2019   Procedure: TRANSESOPHAGEAL ECHOCARDIOGRAM (TEE);  Surgeon: Elease Hashimoto Deloris Ping, MD;  Location: [facility] ENDOSCOPY;  Service: Cardiovascular;  Laterality: N/A;   TONSILECTOMY, ADENOIDECTOMY, BILATERAL MYRINGOTOMY AND TUBES  child   TOTAL ABDOMINAL HYSTERECTOMY  1995   had 2 tumors- benign   TUBAL LIGATION  89 years old    Family History  Problem Relation Age of  Onset   Heart attack Mother 44   Hyperlipidemia Mother        ?   Dementia Mother    Pernicious anemia Mother    Heart disease Mother    COPD Father        smoker   Cancer Father 34       prostate   Heart disease Brother        quadruple bipass surgery   Hyperlipidemia Brother    Hypertension Brother    Diabetes Maternal Grandmother        type 2   Pernicious anemia Maternal Grandmother    Gout Son    Atrial fibrillation Son    Hyperlipidemia Son    Cancer Maternal Grandfather        liver   Cancer Paternal Grandmother        lung- doesn't think she smokes?   Stroke Paternal Grandfather    Cancer Son 58       non hodgin's lymphoma   Gout Son    Hyperlipidemia Son    Sleep apnea Son    Heart disease Son    Colon cancer Neg Hx    Pancreatic cancer Neg Hx    Stomach cancer Neg Hx    Throat cancer Neg Hx    Liver disease Neg Hx    Social History:  reports that she has never smoked. She has never used smokeless tobacco. She reports that she does not drink alcohol and does not use drugs.  Allergies:  Allergies  Allergen Reactions   Gabapentin Swelling    Eyes and Face   Lactose Intolerance (Gi) Other (See Comments)    Bothers her stomach   Penicillins Hives and Rash    Did it involve swelling of the face/tongue/throat, SOB, or low BP? No Did it involve sudden or severe rash/hives, skin peeling, or any reaction on the inside of your mouth or nose? Yes Did you need to seek medical attention at a hospital or doctor's office? No When did it last happen?       25+ years If all above answers are "NO", may proceed with cephalosporin use.  Hives in throat     Sulfa Antibiotics Nausea Only and Rash   Vancomycin Itching   Zebeta [Bisoprolol Fumarate] Nausea Only    Medications Prior to Admission  Medication Sig Dispense Refill   acetaminophen (TYLENOL) 500 MG tablet Take 1,000 mg by mouth every 6 (six) hours as needed for moderate pain (pain score 4-6).     amLODipine  (NORVASC) 2.5 MG tablet Take 1 tablet (2.5 mg total) by mouth daily. 90 tablet 1   atorvastatin (LIPITOR) 20 MG tablet Take 1 tablet (20 mg total) by mouth daily. 90 tablet 1   bumetanide (BUMEX) 1 MG tablet TAKE 1 TABLET BY MOUTH DAILY 90 tablet 1   Calcium Carb-Cholecalciferol (CALCIUM 600 + D PO) Take 1  tablet by mouth 2 (two) times daily.     Carboxymethylcellul-Glycerin (LUBRICATING EYE DROPS OP) Place 1 drop into both eyes in the morning and at bedtime.     ELIQUIS 5 MG TABS tablet TAKE ONE TABLET EVERY MORNING AND AT BEDTIME 180 tablet 1   lisinopril (ZESTRIL) 10 MG tablet Take 1 tablet (10 mg total) by mouth daily. 90 tablet 1   loratadine (CLARITIN) 10 MG tablet Take 10 mg by mouth daily.      Multiple Vitamins-Minerals (ONE-A-DAY WOMENS 50 PLUS PO) Take 1 tablet by mouth daily.     potassium chloride (KLOR-CON) 10 MEQ tablet TAKE 1 TABLET BY MOUTH IN  THE MORNING AND 2 TABLETS  BY MOUTH IN THE EVENING 270 tablet 3   Probiotic CAPS Take 1 capsule by mouth daily. Gummy     psyllium (REGULOID) 0.52 g capsule Take 1 capsule by mouth in the morning and at bedtime.     tiZANidine (ZANAFLEX) 2 MG tablet Take 0.5-2 tablets (1-4 mg total) by mouth every 8 (eight) hours as needed for muscle spasms. 40 tablet 2   traMADol (ULTRAM) 50 MG tablet Take 50 mg by mouth every 6 (six) hours as needed for moderate pain (pain score 4-6).      Results for orders placed or performed during the hospital encounter of 12/02/23 (from the past 48 hours)  Surgical pcr screen     Status: None   Collection Time: 12/02/23  1:52 PM   Specimen: Nasal Mucosa; Nasal Swab  Result Value Ref Range   MRSA, PCR NEGATIVE NEGATIVE   Staphylococcus aureus NEGATIVE NEGATIVE    Comment: (NOTE) The Xpert SA Assay (FDA approved for NASAL specimens in patients 1 years of age and older), is one component of a comprehensive surveillance program. It is not intended to diagnose infection nor to guide or monitor  treatment. Performed at [facility], [address], [place], [place] [address]   Basic metabolic panel per protocol     Status: Abnormal   Collection Time: 12/02/23  2:02 PM  Result Value Ref Range   Sodium 139 135 - 145 mmol/L   Potassium 4.1 3.5 - 5.1 mmol/L   Chloride 100 98 - 111 mmol/L   CO2 29 22 - 32 mmol/L   Glucose, Bld 114 (H) 70 - 99 mg/dL    Comment: Glucose reference range applies only to samples taken after fasting for at least 8 hours.   BUN 16 8 - 23 mg/dL   Creatinine, Ser 1.91 (H) 0.44 - 1.00 mg/dL   Calcium 9.5 8.9 - 47.8 mg/dL   GFR, Estimated 41 (L) >60 mL/min    Comment: (NOTE) Calculated using the CKD-EPI Creatinine Equation (2021)    Anion gap 10 5 - 15    Comment: Performed at [facility], [address], [place], [place] [address]  CBC per protocol     Status: None   Collection Time: 12/02/23  2:02 PM  Result Value Ref Range   WBC 5.6 4.0 - 10.5 K/uL   RBC 4.09 3.87 - 5.11 MIL/uL   Hemoglobin 13.3 12.0 - 15.0 g/dL   HCT 13.0 86.5 - 78.4 %   MCV 97.6 80.0 - 100.0 fL   MCH 32.5 26.0 - 34.0 pg   MCHC 33.3 30.0 - 36.0 g/dL   RDW 69.6 29.5 - 28.4 %   Platelets 245 150 - 400 K/uL   nRBC 0.0 0.0 - 0.2 %    Comment:  Performed at [facility], [address], [place], [place] [address]   No results found.  Pertinent items noted in HPI and remainder of comprehensive ROS otherwise negative.  Blood pressure (!) 146/67, pulse 70, temperature 97.8 F (36.6 C), temperature source Oral, resp. rate 16, height 5\' 3"  (1.6 m), weight 127.5 kg, SpO2 96%.  Patient is awake and alert.  She is oriented and appropriate.  Her speech is fluent.  Her judgment insight are intact.  Cranial nerve function normal bilateral.  Motor examination reveals weakness in her left quadriceps muscle group rating at 3/5.  Sensory examination decreased sensation pinprick light touch in the left L3 and L4 dermatomes.  Straight raising is positive on the left equivocal on  the right.  Distal motor strength is good.  Examination head ears eyes nose and throat is unremarkable chest and abdomen are benign.  Extremities are free of major deformity. Assessment/Plan Left L3-4 herniated nucleus process with severe radiculopathy.  Plan left L3-4 laminotomy and microdiscectomy.  Risks and benefits been explained.  Patient wishes proceed.  Kathaleen Maser Derreon Consalvo 12/03/2023, 8:35 AM

## 2023-12-03 NOTE — Telephone Encounter (Signed)
 Copied from CRM [id number]. Topic: General - Other >> Dec 01, 2023  1:17 PM Kathryne Eriksson wrote: Reason for CRM: [facility] Request

## 2023-12-03 NOTE — Anesthesia Procedure Notes (Signed)
 Procedure Name: Intubation Date/Time: 12/03/2023 10:58 AM  Performed by: Einar Grad, CRNAPre-anesthesia Checklist: Patient identified, Emergency Drugs available, Suction available and Patient being monitored Patient Re-evaluated:Patient Re-evaluated prior to induction Oxygen Delivery Method: Circle System Utilized Preoxygenation: Pre-oxygenation with 100% oxygen Induction Type: IV induction Ventilation: Mask ventilation without difficulty Laryngoscope Size: Mac and 3 Grade View: Grade II Tube type: Oral Number of attempts: 1 Airway Equipment and Method: Stylet and Oral airway Placement Confirmation: ETT inserted through vocal cords under direct vision, positive ETCO2 and breath sounds checked- equal and bilateral Secured at: 21 cm Tube secured with: Tape Dental Injury: Teeth and Oropharynx as per pre-operative assessment

## 2023-12-03 NOTE — Plan of Care (Signed)

## 2023-12-03 NOTE — Telephone Encounter (Signed)
 Copied from CRM [id number]. Topic: Medical Record Request - Other >> Dec 03, 2023  2:25 PM Abigail D wrote: Reason for CRM: Dru from [facility] calling to request status of FL2 form for Ms. Tracy Miles. Also requesting list of current medications. You can email/call or fax listed below.  Phone: [phone] Fax: [phone] Email: [email or web address]

## 2023-12-03 NOTE — Brief Op Note (Signed)
 12/03/2023  12:38 PM  PATIENT:  Tracy Miles  89 y.o. female  PRE-OPERATIVE DIAGNOSIS:  Lumbar radiculopathy  POST-OPERATIVE DIAGNOSIS:  Lumbar radiculopathy  PROCEDURE:  Procedure(s) with comments: LEFT LUMBAR THREE-FOUR LUMBAR LAMINECTOMY/DECOMPRESSION MICRODISCECTOMY (Left) - Microdiscectomy - left - L3-L4  SURGEON:  Surgeons and Role:    Julio Sicks, MD - Primary  PHYSICIAN ASSISTANT:   ASSISTANTSMarland Mcalpine   ANESTHESIA:   general  EBL:  200 mL   BLOOD ADMINISTERED:none  DRAINS: none   LOCAL MEDICATIONS USED:  MARCAINE     SPECIMEN:  No Specimen  DISPOSITION OF SPECIMEN:  N/A  COUNTS:  YES  TOURNIQUET:  * No tourniquets in log *  DICTATION: .Dragon Dictation  PLAN OF CARE: Admit for overnight observation  PATIENT DISPOSITION:  PACU - hemodynamically stable.   Delay start of Pharmacological VTE agent (>24hrs) due to surgical blood loss or risk of bleeding: yes

## 2023-12-03 NOTE — Op Note (Signed)
 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  Asst. Surgeon: Doran Durand, NP  Anesthesia: General  Indication: 89 year old female with severe left lower extremity radicular pain numbness and weakness.  Workup demonstrates evidence of significant spondylosis with an associated small synovial cyst on the left at L3-4 causing significant dorsal thecal sac and nerve root compression superimposed upon this is a large superiorly migrated disc herniation at L3-4 extending into the left L3 foramen.  Patient presents now for decompressive laminotomy, resection of cyst and microdiscectomy in hopes of improving her symptoms.  Operative note: After induction of anesthesia, patient position prone on the Wilson frame and appropriately padded.  The lumbar region prepped and draped sterilely.  Incision made overlying L3-4.  Dissection performed on the left.  Retractor placed.  X-ray taken.  Level confirmed.  Laminotomy then performed using high-speed drill and Kerrison rongeurs to remove the inferior aspect of the lamina of L3 the medial aspect of the L3-4 facet joint and the superior rim of the L4 lamina.  Ligament flavum elevated and resected.  Microscope brought into the field each microdissection.  There is an inherent left-sided dorsal lateral synovial cyst causing compression of the left L4 nerve root.  This was dissected free and resected utilizing microdissection for the resection.  Disc space was identified as was the superiorly migrated disc fragment.  This was removed in several large pieces.  All elements of the disc herniation that had migrated were removed.  There was a gross  annular tear in the disc space itself.  This was entered.  All loose or obviously degenerative disc material was then removed from the interspace as well as any subannular disc herniation.  At this point a very thorough decompression and been achieved.  There was no evidence of injury to the thecal sac or nerve roots.  The wound was then irrigated.  Gelfoam was placed topically for hemostasis.  Wound was then closed in layers with Vicryl sutures.  Steri-Strips and sterile dressing were applied.  No apparent complications.  Patient tolerated the procedure well and she returns to the recovery room postop.

## 2023-12-03 NOTE — Transfer of Care (Signed)
 Immediate Anesthesia Transfer of Care Note  Patient: Tracy Miles  Procedure(s) Performed: LEFT LUMBAR THREE-FOUR LUMBAR LAMINECTOMY/DECOMPRESSION MICRODISCECTOMY (Left: Spine Lumbar)  Patient Location: PACU  Anesthesia Type:General  Level of Consciousness: awake and alert   Airway & Oxygen Therapy: Patient Spontanous Breathing and Patient connected to face mask oxygen  Post-op Assessment: Report given to RN, Post -op Vital signs reviewed and stable, and Patient moving all extremities X 4  Post vital signs: Reviewed and stable  Last Vitals:  Vitals Value Taken Time  BP 151/94 12/03/23 1300  Temp 36.5 C 12/03/23 1254  Pulse 70 12/03/23 1301  Resp 18 12/03/23 1301  SpO2 100 % 12/03/23 1301  Vitals shown include unfiled device data.  Last Pain:  Vitals:   12/03/23 0758  TempSrc: Oral  PainSc: 7          Complications: No notable events documented.

## 2023-12-04 DIAGNOSIS — Z96652 Presence of left artificial knee joint: Secondary | ICD-10-CM | POA: Diagnosis present

## 2023-12-04 DIAGNOSIS — M5116 Intervertebral disc disorders with radiculopathy, lumbar region: Secondary | ICD-10-CM | POA: Diagnosis present

## 2023-12-04 DIAGNOSIS — I2510 Atherosclerotic heart disease of native coronary artery without angina pectoris: Secondary | ICD-10-CM | POA: Diagnosis present

## 2023-12-04 DIAGNOSIS — Z882 Allergy status to sulfonamides status: Secondary | ICD-10-CM | POA: Diagnosis not present

## 2023-12-04 DIAGNOSIS — Z881 Allergy status to other antibiotic agents status: Secondary | ICD-10-CM | POA: Diagnosis not present

## 2023-12-04 DIAGNOSIS — Z825 Family history of asthma and other chronic lower respiratory diseases: Secondary | ICD-10-CM | POA: Diagnosis not present

## 2023-12-04 DIAGNOSIS — E785 Hyperlipidemia, unspecified: Secondary | ICD-10-CM | POA: Diagnosis present

## 2023-12-04 DIAGNOSIS — I110 Hypertensive heart disease with heart failure: Secondary | ICD-10-CM | POA: Diagnosis present

## 2023-12-04 DIAGNOSIS — Z8249 Family history of ischemic heart disease and other diseases of the circulatory system: Secondary | ICD-10-CM | POA: Diagnosis not present

## 2023-12-04 DIAGNOSIS — Z880 Allergy status to penicillin: Secondary | ICD-10-CM | POA: Diagnosis not present

## 2023-12-04 DIAGNOSIS — Z90710 Acquired absence of both cervix and uterus: Secondary | ICD-10-CM | POA: Diagnosis not present

## 2023-12-04 DIAGNOSIS — M5416 Radiculopathy, lumbar region: Secondary | ICD-10-CM | POA: Diagnosis present

## 2023-12-04 DIAGNOSIS — Z950 Presence of cardiac pacemaker: Secondary | ICD-10-CM | POA: Diagnosis not present

## 2023-12-04 DIAGNOSIS — M48061 Spinal stenosis, lumbar region without neurogenic claudication: Secondary | ICD-10-CM | POA: Diagnosis present

## 2023-12-04 DIAGNOSIS — Z833 Family history of diabetes mellitus: Secondary | ICD-10-CM | POA: Diagnosis not present

## 2023-12-04 DIAGNOSIS — Z888 Allergy status to other drugs, medicaments and biological substances status: Secondary | ICD-10-CM | POA: Diagnosis not present

## 2023-12-04 DIAGNOSIS — Z961 Presence of intraocular lens: Secondary | ICD-10-CM | POA: Diagnosis present

## 2023-12-04 DIAGNOSIS — Z7901 Long term (current) use of anticoagulants: Secondary | ICD-10-CM | POA: Diagnosis not present

## 2023-12-04 DIAGNOSIS — Z83438 Family history of other disorder of lipoprotein metabolism and other lipidemia: Secondary | ICD-10-CM | POA: Diagnosis not present

## 2023-12-04 DIAGNOSIS — Z86718 Personal history of other venous thrombosis and embolism: Secondary | ICD-10-CM | POA: Diagnosis not present

## 2023-12-04 DIAGNOSIS — Z823 Family history of stroke: Secondary | ICD-10-CM | POA: Diagnosis not present

## 2023-12-04 DIAGNOSIS — I4819 Other persistent atrial fibrillation: Secondary | ICD-10-CM | POA: Diagnosis present

## 2023-12-04 DIAGNOSIS — Z807 Family history of other malignant neoplasms of lymphoid, hematopoietic and related tissues: Secondary | ICD-10-CM | POA: Diagnosis not present

## 2023-12-04 DIAGNOSIS — I5032 Chronic diastolic (congestive) heart failure: Secondary | ICD-10-CM | POA: Diagnosis present

## 2023-12-04 DIAGNOSIS — E739 Lactose intolerance, unspecified: Secondary | ICD-10-CM | POA: Diagnosis present

## 2023-12-04 NOTE — Progress Notes (Signed)
 Patient ID: Tracy Miles, female   DOB: 01/14/1934, 89 y.o.   MRN: [id number] Overall she appears to be doing okay other than some spasm-like pain in the right lower extremity.  Her left leg pain that was there preoperatively is gone.  When she tried to get up yesterday her right leg gave away.  Physical and Occupational Therapy have been consulted.  She seems comfortable in bed.  She seems to move her legs okay.  Mobilize with PT and OT.  When she is safe she can be discharged to [facility].

## 2023-12-04 NOTE — Evaluation (Signed)
 Occupational Therapy Evaluation Patient Details Name: Tracy Miles MRN: [id number] DOB: [birth year] Today's Date: 12/04/2023   History of Present Illness   Pt is an 89 y/o female admitted 12/03/23 for elective left L3-4 decompressive laminotomy with resection of synovial cyst, microdissection with left L3-4 microdiscectomy. PMH includes Anemia, Aortic stenosis, Benign paroxysmal positional vertigo, CAD, DDD, Dysrhythmia, Hyperlipidemia, HTN, Obesity, OSA on CPAP, Pancreatitis, Persistent atrial fibrillation, Personal history of DVT (X 2), permanent cardiac pacemaker, Sleep apnea, and Spinal stenosis.     Clinical Impressions Pt is typically mod I for ADL and mobility. She relays that recently she requires extra time and planning for ADL - that it takes all morning, but she can do it. She typically gets around with RW and does have DME for bathing/AE for dressing/bathing. Today she is able to recall 2/3 back precautions at onset of session and 3/3 precautions at end of session with mod cues during functional ADL to maintain. Pt provided with back handout and reviewed in full with emphasis on ADL. Today she was able to perform bed mobility at min A +2 with cues for sequencing and safety, max A for LB dressing at this time, min A +2 for sit<>stand and transfer to bathroom. She was able to perform peri care with set up from higher commode. Pt will need training on AE for rear peri care. Pt is very motivated and will benefit from skilled OT in the acute setting as well as afterwards at rehab of <3 hours daily (She lined this up pre-op at [facility]). OT will follow acutely, and next session will focus on practice with AE in addition to OOB and maintaining back precautions.      If plan is discharge home, recommend the following:   A little help with walking and/or transfers;A lot of help with bathing/dressing/bathroom;Assistance with cooking/housework;Assist for transportation;Help with stairs or ramp  for entrance     Functional Status Assessment   Patient has had a recent decline in their functional status and demonstrates the ability to make significant improvements in function in a reasonable and predictable amount of time.     Equipment Recommendations   Other (comment) (defer to next venue of care)     Recommendations for Other Services   PT consult     Precautions/Restrictions   Precautions Precautions: Back;Fall Precaution Booklet Issued: Yes (comment) Recall of Precautions/Restrictions: Intact Precaution/Restrictions Comments: watch L knee history buckling Restrictions Weight Bearing Restrictions Per Provider Order: No     Mobility Bed Mobility Overal bed mobility: Needs Assistance Bed Mobility: Rolling, Sidelying to Sit Rolling: Min assist, Used rails, +2 for safety/equipment Sidelying to sit: Min assist, Used rails, +2 for safety/equipment       General bed mobility comments: Min assist to roll towards left side after reviewing log roll technique; impulsive to twist initially but with review performed at a min assist level, gentle guidance of LEs off bed and trunk support to rise.    Transfers Overall transfer level: Needs assistance Equipment used: Rolling walker (2 wheels) Transfers: Sit to/from Stand Sit to Stand: Min assist, +2 safety/equipment, From elevated surface           General transfer comment: Min assist for boost and balance with +2 available for safety. LLE did slightly buckle and require assist to stabilize but no recurrence after initial stand. Cues for hand placement. to maximize safety for transfer. Performed in bathroom with OT assist.      Balance Overall balance assessment: Needs assistance Sitting-balance  support: Feet supported, No upper extremity supported Sitting balance-Leahy Scale: Fair     Standing balance support: Single extremity supported Standing balance-Leahy Scale: Poor                              ADL either performed or assessed with clinical judgement   ADL Overall ADL's : Needs assistance/impaired Eating/Feeding: Independent   Grooming: Wash/dry hands;Wash/dry face;Set up;Sitting Grooming Details (indicate cue type and reason): reviewed cup method and Upper Body Bathing: Contact guard assist;Cueing for safety;Sitting   Lower Body Bathing: Moderate assistance;Sitting/lateral leans Lower Body Bathing Details (indicate cue type and reason): knees down - educated on Upper Body Dressing : Minimal assistance   Lower Body Dressing: Maximal assistance Lower Body Dressing Details (indicate cue type and reason): Pt has AE for LB ADL - sock donner, grabber/reacher - re-educated Toilet Transfer: Minimal assistance;Ambulation;Rolling walker (2 wheels);+2 for safety/equipment Toilet Transfer Details (indicate cue type and reason): into bathroom, protective of L knee Toileting- Clothing Manipulation and Hygiene: Contact guard assist;Sitting/lateral lean Toileting - Clothing Manipulation Details (indicate cue type and reason): Pt has purchased a toilet aide, did not practice with this session, verbally reviewed     Functional mobility during ADLs: Minimal assistance;+2 for safety/equipment;Rolling walker (2 wheels) General ADL Comments: educated in back precautions, reviewed AE for LB ADL, educated on compensatory strategies.     Vision Baseline Vision/History: 1 Wears glasses Ability to See in Adequate Light: 0 Adequate Patient Visual Report: No change from baseline Vision Assessment?: No apparent visual deficits;Wears glasses for reading     Perception Perception: Within Functional Limits       Praxis         Pertinent Vitals/Pain Pain Assessment Pain Assessment: Faces Faces Pain Scale: Hurts even more Pain Location: Back and knees Pain Descriptors / Indicators: Aching, Grimacing, Guarding Pain Intervention(s): Monitored during session, Repositioned      Extremity/Trunk Assessment Upper Extremity Assessment Upper Extremity Assessment: Overall WFL for tasks assessed   Lower Extremity Assessment Lower Extremity Assessment: Defer to PT evaluation LLE Deficits / Details: Grossly 5/5, hip extension 4/5   Cervical / Trunk Assessment Cervical / Trunk Assessment: Back Surgery   Communication Communication Communication: No apparent difficulties   Cognition Arousal: Alert Behavior During Therapy: WFL for tasks assessed/performed Cognition: No apparent impairments                               Following commands: Intact       Cueing  General Comments   Cueing Techniques: Verbal cues;Gestural cues  Pt's son and daughter in law present and supportive throughout session   Exercises     Shoulder Instructions      Home Living Family/patient expects to be discharged to:: Private residence (ILF at [facility]) Living Arrangements: Alone Available Help at Discharge: Available 24 hours/day (staff) Type of Home: House Home Access: Level entry     Home Layout: One level     Bathroom Shower/Tub: Producer, television/film/video: Handicapped height Bathroom Accessibility: Yes How Accessible: Accessible via walker Home Equipment: Shower seat;Hand held Programmer, systems (2 wheels);Crutches;Cane - single point;Grab bars - tub/shower   Additional Comments: plan to go to SNF at [facility] for 2 weeks for rehab      Prior Functioning/Environment Prior Level of Function : Independent/Modified Independent  Mobility Comments: using RW for mobility ADLs Comments: doing her own ADL but with increased time and effort    OT Problem List: Decreased activity tolerance;Decreased range of motion;Impaired balance (sitting and/or standing);Decreased safety awareness;Decreased knowledge of use of DME or AE;Decreased knowledge of precautions;Obesity;Pain   OT Treatment/Interventions: Self-care/ADL  training;Energy conservation;DME and/or AE instruction;Therapeutic activities;Patient/family education;Balance training      OT Goals(Current goals can be found in the care plan section)   Acute Rehab OT Goals Patient Stated Goal: get back to independent, not fall OT Goal Formulation: With patient/family Time For Goal Achievement: 12/18/23 Potential to Achieve Goals: Good ADL Goals Pt Will Perform Grooming: with modified independence;standing Pt Will Perform Upper Body Dressing: with modified independence;sitting Pt Will Perform Lower Body Dressing: with set-up;with adaptive equipment;sit to/from stand Pt Will Transfer to Toilet: with modified independence;ambulating Pt Will Perform Toileting - Clothing Manipulation and hygiene: with modified independence;with adaptive equipment;sit to/from stand Additional ADL Goal #1: Pt will verbalize and maintain back precautions during ADL routine with no cues   OT Frequency:  Min 2X/week    Co-evaluation PT/OT/SLP Co-Evaluation/Treatment: Yes Reason for Co-Treatment: For patient/therapist safety;To address functional/ADL transfers PT goals addressed during session: Mobility/safety with mobility;Balance;Proper use of DME OT goals addressed during session: ADL's and self-care;Strengthening/ROM;Proper use of Adaptive equipment and DME      AM-PAC OT "6 Clicks" Daily Activity     Outcome Measure Help from another person eating meals?: None Help from another person taking care of personal grooming?: A Little Help from another person toileting, which includes using toliet, bedpan, or urinal?: A Little Help from another person bathing (including washing, rinsing, drying)?: A Lot Help from another person to put on and taking off regular upper body clothing?: A Little Help from another person to put on and taking off regular lower body clothing?: A Lot 6 Click Score: 17   End of Session Equipment Utilized During Treatment: Rolling walker (2  wheels);Gait belt Nurse Communication: Mobility status;Precautions  Activity Tolerance: Patient tolerated treatment well Patient left: in chair;with call bell/phone within reach;with chair alarm set;with family/visitor present  OT Visit Diagnosis: Unsteadiness on feet (R26.81);Other abnormalities of gait and mobility (R26.89);Muscle weakness (generalized) (M62.81);Pain Pain - Right/Left: Left Pain - part of body: Leg                Time: 1010-1049 OT Time Calculation (min): 39 min Charges:  OT General Charges $OT Visit: 1 Visit OT Evaluation $OT Eval Moderate Complexity: 1 Mod  Nyoka Cowden OTR/L Acute Rehabilitation Services Office: [phone]   Evern Bio [facility] 12/04/2023, 3:45 PM

## 2023-12-04 NOTE — Plan of Care (Signed)
  Problem: Activity: Goal: Risk for activity intolerance will decrease Outcome: Progressing   Problem: Safety: Goal: Ability to remain free from injury will improve Outcome: Progressing   

## 2023-12-04 NOTE — Evaluation (Addendum)
 Physical Therapy Evaluation Patient Details Name: Tracy Miles MRN: [id number] DOB: 01/23/1934 Today's Date: 12/04/2023  History of Present Illness  Pt is an 89 y/o female admitted 12/03/23 for elective left L3-4 decompressive laminotomy with resection of synovial cyst, microdissection with left L3-4 microdiscectomy. PMH includes Anemia, Aortic stenosis, Benign paroxysmal positional vertigo, CAD, DDD, Dysrhythmia, Hyperlipidemia, HTN, Obesity, OSA on CPAP, Pancreatitis, Persistent atrial fibrillation, Personal history of DVT (X 2), permanent cardiac pacemaker, Sleep apnea, and Spinal stenosis.   Clinical Impression  Patient is s/p above surgery presenting with functional limitations due to the deficits listed below (see PT Problem List). Prior to admission, lived ILF at [facility]. Has pre-arranged SNF stay at [facility] as well. Was active, using a RW for support at mod I level, living alone. Currently required min assist +2 for safety with bed mobility, transfers, and gait training with RW today. Reported LLE buckle yesterday; there was slight buckle of LLE with initial stand today but able to correct with training for proper AD use. Patient will benefit from acute skilled PT to increase their independence and safety with mobility to facilitate discharge.         If plan is discharge home, recommend the following: A little help with walking and/or transfers;A little help with bathing/dressing/bathroom;Assistance with cooking/housework;Assist for transportation   Can travel by private vehicle   Yes    Equipment Recommendations None recommended by PT  Recommendations for Other Services       Functional Status Assessment Patient has had a recent decline in their functional status and demonstrates the ability to make significant improvements in function in a reasonable and predictable amount of time.     Precautions / Restrictions Precautions Precautions: Back;Fall Precaution  Booklet Issued: Yes (comment) Recall of Precautions/Restrictions: Intact Precaution/Restrictions Comments: watch L knee history buckling Restrictions Weight Bearing Restrictions Per Provider Order: No      Mobility  Bed Mobility Overal bed mobility: Needs Assistance Bed Mobility: Rolling, Sidelying to Sit Rolling: Min assist, Used rails, +2 for safety/equipment Sidelying to sit: Min assist, Used rails, +2 for safety/equipment       General bed mobility comments: Min assist to roll towards left side after reviewing log roll technique; impulsive to twist initially but with review performed at a min assist level, gentle guidance of LEs off bed and trunk support to rise.    Transfers Overall transfer level: Needs assistance Equipment used: Rolling walker (2 wheels) Transfers: Sit to/from Stand Sit to Stand: Min assist, +2 safety/equipment, From elevated surface           General transfer comment: Min assist for boost and balance with +2 available for safety. LLE did slightly buckle and require assist to stabilize but no recurrence after initial stand. Cues for hand placement. to maximize safety for transfer. Performed in bathroom with OT assist.    Ambulation/Gait Ambulation/Gait assistance: Min assist, +2 safety/equipment Gait Distance (Feet): 24 Feet (+12) Assistive device: Rolling walker (2 wheels) Gait Pattern/deviations: Step-to pattern, Step-through pattern, Decreased stride length, Decreased stance time - left Gait velocity: dec Gait velocity interpretation: <1.31 ft/sec, indicative of household ambulator   General Gait Details: Educated on safe AD use with RW for support. Cues for sequencing, step-to pattern initially for safety with Lt knee buckle yesterday and mild instability noted today. Min assist for RW control intermittently +2 for safety. Performed short bout to bathroom, followed by slightly longer distance around room.  Stairs  Wheelchair  Mobility     Tilt Bed    Modified Rankin (Stroke Patients Only)       Balance Overall balance assessment: Needs assistance Sitting-balance support: Feet supported, No upper extremity supported Sitting balance-Leahy Scale: Fair     Standing balance support: Single extremity supported Standing balance-Leahy Scale: Poor                               Pertinent Vitals/Pain Pain Assessment Pain Assessment: Faces Faces Pain Scale: Hurts even more Pain Location: Back and knees Pain Descriptors / Indicators: Aching, Grimacing, Guarding Pain Intervention(s): Monitored during session, Repositioned, Limited activity within patient's tolerance    Home Living Family/patient expects to be discharged to:: Private residence (ILF at [facility]) Living Arrangements: Alone Available Help at Discharge: Available 24 hours/day (staff) Type of Home: House           Home Equipment: Shower seat;Hand held Programmer, systems (2 wheels);Crutches;Cane - single point;Grab bars - tub/shower Additional Comments: plan to go to [facility] at [facility] for 2 weeks for rehab prior to    Prior Function Prior Level of Function : Independent/Modified Independent             Mobility Comments: using RW for mobility       Extremity/Trunk Assessment   Upper Extremity Assessment Upper Extremity Assessment: Defer to OT evaluation    Lower Extremity Assessment Lower Extremity Assessment: LLE deficits/detail LLE Deficits / Details: Grossly 5/5, hip extension 4/5       Communication   Communication Communication: No apparent difficulties    Cognition Arousal: Alert Behavior During Therapy: WFL for tasks assessed/performed   PT - Cognitive impairments: No apparent impairments                         Following commands: Intact       Cueing Cueing Techniques: Verbal cues, Gestural cues     General Comments General comments (skin integrity, edema, etc.):  Reviewed precautions.    Exercises     Assessment/Plan    PT Assessment Patient needs continued PT services  PT Problem List Decreased strength;Decreased range of motion;Decreased activity tolerance;Decreased balance;Decreased mobility;Decreased knowledge of use of DME;Decreased knowledge of precautions;Obesity;Pain       PT Treatment Interventions DME instruction;Gait training;Functional mobility training;Therapeutic activities;Therapeutic exercise;Balance training;Neuromuscular re-education;Patient/family education;Modalities    PT Goals (Current goals can be found in the Care Plan section)  Acute Rehab PT Goals Patient Stated Goal: Get well PT Goal Formulation: With patient Time For Goal Achievement: 12/11/23 Potential to Achieve Goals: Good    Frequency Min 3X/week     Co-evaluation PT/OT/SLP Co-Evaluation/Treatment: Yes Reason for Co-Treatment: For patient/therapist safety;To address functional/ADL transfers PT goals addressed during session: Mobility/safety with mobility;Balance;Proper use of DME         AM-PAC PT "6 Clicks" Mobility  Outcome Measure Help needed turning from your back to your side while in a flat bed without using bedrails?: A Little Help needed moving from lying on your back to sitting on the side of a flat bed without using bedrails?: A Little Help needed moving to and from a bed to a chair (including a wheelchair)?: A Little Help needed standing up from a chair using your arms (e.g., wheelchair or bedside chair)?: A Little Help needed to walk in hospital room?: A Little Help needed climbing 3-5 steps with a railing? : A Lot 6 Click  Score: 17    End of Session Equipment Utilized During Treatment: Gait belt Activity Tolerance: Patient tolerated treatment well Patient left: in chair;with call bell/phone within reach;with chair alarm set;with family/visitor present   PT Visit Diagnosis: Unsteadiness on feet (R26.81);Other abnormalities of gait and  mobility (R26.89);Muscle weakness (generalized) (M62.81);Pain Pain - Right/Left:  (knees) Pain - part of body:  (back)    Time: 9518-8416 PT Time Calculation (min) (ACUTE ONLY): 38 min   Charges:   PT Evaluation $PT Eval Low Complexity: 1 Low PT Treatments $Therapeutic Activity: 8-22 mins PT General Charges $$ ACUTE PT VISIT: 1 Visit         Kathlyn Sacramento, PT, DPT [facility]  Rehabilitation Services Physical Therapist Office: [phone] Website: [email or web address]   Berton Mount 12/04/2023, 12:51 PM

## 2023-12-04 NOTE — Care Management Obs Status (Signed)
 MEDICARE OBSERVATION STATUS NOTIFICATION   Patient Details  Name: Krystalynn Ridgeway MRN: [id number] Date of Birth: 04/25/1934   Medicare Observation Status Notification Given:  Yes    Ronny Bacon, RN 12/04/2023, 11:43 AM

## 2023-12-04 NOTE — Care Management (Cosign Needed)
    Durable Medical Equipment  (From admission, onward)           Start     Ordered   12/04/23 1225  For home use only DME 4 wheeled rolling walker with seat  Once       Question:  Patient needs a walker to treat with the following condition  Answer:  Weakness   12/04/23 1225

## 2023-12-05 MED ORDER — HYDROCODONE-ACETAMINOPHEN 5-325 MG PO TABS: 1.0000 | ORAL_TABLET | ORAL | 0 refills | Status: DC | PRN

## 2023-12-05 NOTE — Discharge Summary (Signed)
 Physician Discharge Summary  Patient ID: Tracy Miles MRN: [id number] DOB/AGE: 1934/02/23 89 y.o.  Admit date: 12/03/2023 Discharge date: 12/05/2023  Admission Diagnoses: Lumbar stenosis with radiculopathy    Discharge Diagnoses: Same   Discharged Condition: stable  Hospital Course: The patient was admitted on 12/03/2023 and taken to the operating room where the patient underwent aL 4 5 decompressive laminectomy for left radiculopathy. The patient tolerated the procedure well and was taken to the recovery room and then to the fLOor in stable condition. The hospital course was routine. There were no complications. The wound remained clean dry and intact. Pt had appropriate back pain soreness.  He did complain of some right leg pain and her legs feel like it was going to give away when she walked at times.  She did well with therapy.  The patient remained afebrile with stable vital signs, and tolerated a regular diet. The patient continued to increase activities, and pain was well controlled with oral pain medications.   Consults: None  Significant Diagnostic Studies:  Results for orders placed or performed during the hospital encounter of 12/02/23  Surgical pcr screen   Collection Time: 12/02/23  1:52 PM   Specimen: Nasal Mucosa; Nasal Swab  Result Value Ref Range   MRSA, PCR NEGATIVE NEGATIVE   Staphylococcus aureus NEGATIVE NEGATIVE  Basic metabolic panel per protocol   Collection Time: 12/02/23  2:02 PM  Result Value Ref Range   Sodium 139 135 - 145 mmol/L   Potassium 4.1 3.5 - 5.1 mmol/L   Chloride 100 98 - 111 mmol/L   CO2 29 22 - 32 mmol/L   Glucose, Bld 114 (H) 70 - 99 mg/dL   BUN 16 8 - 23 mg/dL   Creatinine, Ser 6.57 (H) 0.44 - 1.00 mg/dL   Calcium 9.5 8.9 - 84.6 mg/dL   GFR, Estimated 41 (L) >60 mL/min   Anion gap 10 5 - 15  CBC per protocol   Collection Time: 12/02/23  2:02 PM  Result Value Ref Range   WBC 5.6 4.0 - 10.5 K/uL   RBC 4.09 3.87 - 5.11 MIL/uL    Hemoglobin 13.3 12.0 - 15.0 g/dL   HCT 96.2 95.2 - 84.1 %   MCV 97.6 80.0 - 100.0 fL   MCH 32.5 26.0 - 34.0 pg   MCHC 33.3 30.0 - 36.0 g/dL   RDW 32.4 40.1 - 02.7 %   Platelets 245 150 - 400 K/uL   nRBC 0.0 0.0 - 0.2 %    DG Lumbar Spine 1 View Result Date: 12/03/2023 CLINICAL DATA:  Intraoperative evaluation EXAM: LUMBAR SPINE - 1 VIEW COMPARISON:  09/07/2023 FINDINGS: Cross-table lateral view of the lumbar spine is obtained during an operative procedure and is provided for interpretation only. 5 non-rib-bearing lumbar type vertebral bodies are again is seen for numbering purposes. Surgical instrumentation is seen posterior to the L3-4 disc space. IMPRESSION: 1. Intraoperative evaluation as above. Please refer to the operative report. Electronically Signed   By: Sharlet Salina M.D.   On: 12/03/2023 15:42   MR LUMBAR SPINE WO CONTRAST Result Date: 11/29/2023 CLINICAL DATA:  Synovial cyst of the lumbar spine EXAM: MRI LUMBAR SPINE WITHOUT CONTRAST TECHNIQUE: Multiplanar, multisequence MR imaging of the lumbar spine was performed. No intravenous contrast was administered. COMPARISON:  MRI of the lumbar spine dated 03/19/2014 CT of the lumbar spine dated 10/08/2023 FINDINGS: Segmentation: Standard. Alignment: Mild retrolisthesis of L3 on L4. Grade 1 anterolisthesis of L4 on L5. Vertebrae: Interbody fusion at L5-S1.  Prior posterior decompression at L5-S1. Degenerative endplate marrow changes at a few levels. No compression fractures. Conus medullaris and cauda equina: The conus medullaris terminates at the level of L1-L2. The distal spinal cord signal intensity is normal. Paraspinal and other soft tissues: The visualized abdomen and pelvis show no soft tissue abnormality. The visualized aorta is normal. Disc levels: L1-L2: Disc bulge with central protrusion. Mild bilateral facet arthropathy. No neuroforaminal stenosis. No spinal canal stenosis. L2-L3: Disc bulge. Moderate bilateral facet arthropathy. No  neuroforaminal stenosis. No spinal canal stenosis. L3-L4: Disc bulge with left foraminal disc extrusion. Severe bilateral facet arthropathy. Severe left and mild right neuroforaminal stenosis. Moderate to severe spinal canal stenosis and narrowing of the left subarticular zone. L4-L5: Disc bulge. Severe facet arthropathy. Mild right neuroforaminal stenosis. Severe spinal canal stenosis. L5-S1: Interbody fusion. Facets are fused. No neuroforaminal stenosis. No spinal canal stenosis. IMPRESSION: 1. Interbody fusion at L5-S1. Prior posterior decompression at L5-S1. 2. Disc bulge with left foraminal disc protrusion at L3-L4 results in severe left foraminal stenosis of the most severe canal stenosis and narrowing of the left. 3. Severe canal stenosis at L4-L5 secondary to disc bulging and facet arthropathy. Electronically Signed   By: Baldemar Friday M.D.   On: 11/29/2023 11:24   MM 3D SCREENING MAMMOGRAM BILATERAL BREAST Result Date: 11/17/2023 CLINICAL DATA:  Screening. EXAM: DIGITAL SCREENING BILATERAL MAMMOGRAM WITH TOMOSYNTHESIS AND CAD TECHNIQUE: Bilateral screening digital craniocaudal and mediolateral oblique mammograms were obtained. Bilateral screening digital breast tomosynthesis was performed. The images were evaluated with computer-aided detection. COMPARISON:  Previous exam(s). ACR Breast Density Category a: The breasts are almost entirely fatty. FINDINGS: There are no findings suspicious for malignancy. IMPRESSION: No mammographic evidence of malignancy. A result letter of this screening mammogram will be mailed directly to the patient. RECOMMENDATION: Screening mammogram in one year. (Code:[id number]-B-01Y) BI-RADS CATEGORY  1: Negative. Electronically Signed   By: Amie Portland M.D.   On: 11/17/2023 14:50   CUP PACEART REMOTE DEVICE CHECK Result Date: 11/09/2023 Scheduled non-billable remote reviewed. Normal device function.  F/U as scheduled LA, CVRS   Antibiotics:  Anti-infectives (From admission,  onward)    Start     Dose/Rate Route Frequency Ordered Stop   12/03/23 2000  ceFAZolin (ANCEF) IVPB 1 g/50 mL premix        1 g 100 mL/hr over 30 Minutes Intravenous Every 8 hours 12/03/23 1430 12/04/23 0529   12/03/23 0900  ceFAZolin (ANCEF) IVPB 3g/150 mL premix        3 g 300 mL/hr over 30 Minutes Intravenous On call to O.R. 12/03/23 0752 12/03/23 1105       Discharge Exam: Blood pressure (!) 148/75, pulse 74, temperature 97.7 F (36.5 C), temperature source Oral, resp. rate 17, height 5\' 3"  (1.6 m), weight 127.5 kg, SpO2 98%. Neurologic: Grossly normal Dressing dry  Discharge Medications:   Allergies as of 12/05/2023       Reactions   Gabapentin Swelling   Eyes and Face   Lactose Intolerance (gi) Other (See Comments)   Bothers her stomach   Penicillins Hives, Rash   Did it involve swelling of the face/tongue/throat, SOB, or low BP? No Did it involve sudden or severe rash/hives, skin peeling, or any reaction on the inside of your mouth or nose? Yes Did you need to seek medical attention at a hospital or doctor's office? No When did it last happen?       25+ years If all above answers are "NO", may proceed  with cephalosporin use. Hives in throat    Sulfa Antibiotics Nausea Only, Rash   Vancomycin Itching   Zebeta [bisoprolol Fumarate] Nausea Only        Medication List     TAKE these medications    acetaminophen 500 MG tablet Commonly known as: TYLENOL Take 1,000 mg by mouth every 6 (six) hours as needed for moderate pain (pain score 4-6).   amLODipine 2.5 MG tablet Commonly known as: NORVASC Take 1 tablet (2.5 mg total) by mouth daily.   atorvastatin 20 MG tablet Commonly known as: LIPITOR Take 1 tablet (20 mg total) by mouth daily.   bumetanide 1 MG tablet Commonly known as: BUMEX TAKE 1 TABLET BY MOUTH DAILY   CALCIUM 600 + D PO Take 1 tablet by mouth 2 (two) times daily.   Eliquis 5 MG Tabs tablet Generic drug: apixaban TAKE ONE TABLET EVERY  MORNING AND AT BEDTIME   HYDROcodone-acetaminophen 5-325 MG tablet Commonly known as: NORCO/VICODIN Take 1 tablet by mouth every 4 (four) hours as needed for moderate pain (pain score 4-6) ((score 4 to 6)).   lisinopril 10 MG tablet Commonly known as: ZESTRIL Take 1 tablet (10 mg total) by mouth daily.   loratadine 10 MG tablet Commonly known as: CLARITIN Take 10 mg by mouth daily.   LUBRICATING EYE DROPS OP Place 1 drop into both eyes in the morning and at bedtime.   ONE-A-DAY WOMENS 50 PLUS PO Take 1 tablet by mouth daily.   potassium chloride 10 MEQ tablet Commonly known as: KLOR-CON TAKE 1 TABLET BY MOUTH IN  THE MORNING AND 2 TABLETS  BY MOUTH IN THE EVENING   Probiotic Caps Take 1 capsule by mouth daily. Gummy   psyllium 0.52 g capsule Commonly known as: REGULOID Take 1 capsule by mouth in the morning and at bedtime.   tiZANidine 2 MG tablet Commonly known as: ZANAFLEX Take 0.5-2 tablets (1-4 mg total) by mouth every 8 (eight) hours as needed for muscle spasms.   traMADol 50 MG tablet Commonly known as: ULTRAM Take 50 mg by mouth every 6 (six) hours as needed for moderate pain (pain score 4-6).               Durable Medical Equipment  (From admission, onward)           Start     Ordered   12/05/23 1112  DME Walker  Once       Question Answer Comment  Walker: With 5 Inch Wheels   Patient needs a walker to treat with the following condition Gait instability      12/05/23 1112   12/04/23 1225  For home use only DME 4 wheeled rolling walker with seat  Once       Question:  Patient needs a walker to treat with the following condition  Answer:  Weakness   12/04/23 1225            Disposition: To [facility] for short-term rehab   Final Dx: Laminectomy for stenosis  Discharge Instructions     Call MD for:  difficulty breathing, headache or visual disturbances   Complete by: As directed    Call MD for:  persistant nausea and vomiting    Complete by: As directed    Call MD for:  redness, tenderness, or signs of infection (pain, swelling, redness, odor or green/yellow discharge around incision site)   Complete by: As directed    Call MD for:  severe uncontrolled  pain   Complete by: As directed    Call MD for:  temperature >100.4   Complete by: As directed    Diet - low sodium heart healthy   Complete by: As directed    Increase activity slowly   Complete by: As directed    Remove dressing in 48 hours   Complete by: As directed         Follow-up Information     Julio Sicks, MD. Schedule an appointment as soon as possible for a visit in 2 week(s).   Specialty: Neurosurgery Contact information: [address] [place] [address] [phone]                  Signed: Tia Alert 12/05/2023, 11:12 AM

## 2023-12-05 NOTE — [place] FL2 (Cosign Needed Addendum)
 [place] MEDICAID FL2 LEVEL OF CARE FORM     IDENTIFICATION  Patient Name: Tracy Miles Birthdate: 08-Mar-1934 Sex: female Admission Date (Current Location): 12/03/2023  [facility] and [place] Number:  Producer, television/film/video and Address:  [facility], [address] [place], [place] [address]      Provider Number: [id number]  Attending Physician Name and Address:  Julio Sicks, MD  Relative Name and Phone Number:  Delisia Mcquiston [phone]    Current Level of Care: Hospital Recommended Level of Care: Skilled Nursing Facility Prior Approval Number:    Date Approved/Denied: 12/05/23 PASRR Number: [id number]  Discharge Plan: SNF    Current Diagnoses: Patient Active Problem List   Diagnosis Date Noted   Lumbar radiculopathy 12/03/2023   Low back pain of thoracolumbar region with sciatica 09/27/2023   Allergic rhinitis 11/26/2022   Grief at loss of child 09/08/2022   Vertigo 05/23/2021   Sun-damaged skin 11/15/2020   Rosacea 11/15/2020   History of oral lesions 05/15/2020   Persistent atrial fibrillation (HCC) 04/25/2019   Pulmonary fibrosis, unspecified (HCC) 04/11/2019   Abnormal chest x-ray 03/20/2019   At risk for amiodarone toxicity with long term use 03/20/2019   Hyperglycemia 11/22/2017   Urinary urgency 11/22/2017   Pedal edema 08/28/2016   Medicare annual wellness visit, subsequent 09/15/2015   Chronic diastolic HF (heart failure) (HCC) 08/11/2015   Sleep apnea in adult 02/22/2015   Spinal stenosis of lumbar region 04/02/2014   Urinary tract infection with hematuria 03/26/2014   History of colonic polyps 02/25/2014   Decreased visual acuity 02/25/2014   Benign paroxysmal positional vertigo 12/17/2013   Abnormal results of thyroid function studies 12/17/2013   History of DVT (deep vein thrombosis) 11/19/2013   Allergy 11/19/2013   OA (osteoarthritis) 11/19/2013   Constipation 11/16/2013   Cervical disc disease 11/16/2013    Obesity    Iron deficiency anemia 11/13/2013   Fatigue 11/13/2013   Hyperlipidemia, mixed 10/19/2013   Atrial fibrillation (HCC) 10/04/2013   Essential hypertension 10/04/2013    Orientation RESPIRATION BLADDER Height & Weight     Time, Situation, Self, Place    Incontinent Weight: 281 lb (127.5 kg) Height:  5\' 3"  (160 cm)  BEHAVIORAL SYMPTOMS/MOOD NEUROLOGICAL BOWEL NUTRITION STATUS      Continent Diet  AMBULATORY STATUS COMMUNICATION OF NEEDS Skin   Limited Assist Verbally Normal                       Personal Care Assistance Level of Assistance              Functional Limitations Info             SPECIAL CARE FACTORS FREQUENCY  PT (By licensed PT), OT (By licensed OT)     PT Frequency: 5x OT Frequency: 3x            Contractures Contractures Info: Not present    Additional Factors Info  Code Status, Allergies Code Status Info: Full Allergies Info: Gabapentin  Lactose Intolerance (Gi)  Penicillins  Sulfa Antibiotics  Vancomycin  Zebeta (Bisoprolol Fumarate)           Current Medications (12/05/2023):  This is the current hospital active medication list Current Facility-Administered Medications  Medication Dose Route Frequency Provider Last Rate Last Admin   acetaminophen (TYLENOL) tablet 650 mg  650 mg Oral Q4H PRN Julio Sicks, MD       Or   acetaminophen (TYLENOL) suppository 650 mg  650 mg Rectal Q4H PRN Julio Sicks, MD       acidophilus (RISAQUAD) capsule 1 capsule  1 capsule Oral Daily Julio Sicks, MD   1 capsule at 12/05/23 1007   amLODipine (NORVASC) tablet 2.5 mg  2.5 mg Oral Daily Julio Sicks, MD   2.5 mg at 12/05/23 1007   atorvastatin (LIPITOR) tablet 20 mg  20 mg Oral QHS Julio Sicks, MD   20 mg at 12/04/23 2102   bumetanide (BUMEX) tablet 1 mg  1 mg Oral Daily Julio Sicks, MD   1 mg at 12/05/23 1008   calcium-vitamin D (OSCAL WITH D) 500-5 MG-MCG per tablet 1 tablet  1 tablet Oral BID Julio Sicks, MD   1 tablet at 12/05/23 1008    HYDROcodone-acetaminophen (NORCO) 10-325 MG per tablet 2 tablet  2 tablet Oral Q4H PRN Julio Sicks, MD   2 tablet at 12/05/23 0059   HYDROcodone-acetaminophen (NORCO/VICODIN) 5-325 MG per tablet 1 tablet  1 tablet Oral Q4H PRN Julio Sicks, MD   1 tablet at 12/05/23 1430   HYDROmorphone (DILAUDID) injection 1 mg  1 mg Intravenous Q2H PRN Julio Sicks, MD       lisinopril (ZESTRIL) tablet 10 mg  10 mg Oral Daily Julio Sicks, MD   10 mg at 12/05/23 1007   loratadine (CLARITIN) tablet 10 mg  10 mg Oral Daily Julio Sicks, MD   10 mg at 12/05/23 1007   menthol-cetylpyridinium (CEPACOL) lozenge 3 mg  1 lozenge Oral PRN Julio Sicks, MD       Or   phenol (CHLORASEPTIC) mouth spray 1 spray  1 spray Mouth/Throat PRN Julio Sicks, MD       multivitamin with minerals tablet 1 tablet  1 tablet Oral Daily Julio Sicks, MD   1 tablet at 12/05/23 1007   ondansetron (ZOFRAN) tablet 4 mg  4 mg Oral Q6H PRN Julio Sicks, MD       Or   ondansetron [facility]) injection 4 mg  4 mg Intravenous Q6H PRN Julio Sicks, MD       polyvinyl alcohol (LIQUIFILM TEARS) 1.4 % ophthalmic solution 1 drop  1 drop Both Eyes BID Julio Sicks, MD   1 drop at 12/05/23 1036   potassium chloride (KLOR-CON M) CR tablet 10 mEq  10 mEq Oral BID Julio Sicks, MD   10 mEq at 12/05/23 1007   tiZANidine (ZANAFLEX) tablet 2 mg  2 mg Oral Q8H PRN Julio Sicks, MD   2 mg at 12/05/23 0059     Discharge Medications: Please see discharge summary for a list of discharge medications.  Relevant Imaging Results:  Relevant Lab Results:   Additional Information SS#: [id number]  Helene Kelp, LCSW

## 2023-12-05 NOTE — Plan of Care (Signed)
   Problem: Activity: Goal: Risk for activity intolerance will decrease Outcome: Progressing   Problem: Coping: Goal: Level of anxiety will decrease Outcome: Progressing

## 2023-12-05 NOTE — TOC Transition Note (Signed)
 Transition of Care [facility]) - Discharge Note   Patient Details  Name: Tracy Miles MRN: [id number] Date of Birth: 04-04-1934  Transition of Care [facility]) CM/SW Contact:  Helene Kelp, LCSW Phone Number: 12/05/2023, 2:18 PM   Clinical Narrative:    Csw was contacted to provide disposition (help the pt's daughter-in-law contact the facility-- [facility] [address] [place], [place] [phone]).   CSW contacted the facility and spoke with the facility representative [place]. The  rep noted he did not have any paperwork the pt was set to arrive today at the facility to be admitted. CSW update the re, the daughter-in-law was trying to reach the facility to follow-up with them regarding the pt being admitted to today. The rep noted to provide daughter in-law the number to the facility to have her contact him.   CSW contacted the pt's daughter-in-law Davine Sweney [phone] and reviewed the information noted above. Verda Cumins said she was under the impression after speaking with the pt, everything was worked out with her going to the facility today, after the pt spoke with the facility representative [facility]. CSW processed with the pt about her communication with the facility rep: [facility], and the pt stated she was unaware of any paperwork being completed pertaining to insurance authorization to the facility and clinical information needed for the pt to transfer to the facility. Verda Cumins said she would follow-up with the facility rep: Barbara Cower to obtain information pertaining to the communication from rep: [facility] for the pt's admission to the facility    Unit nurse Greggory Stallion RN nted she was contacted by the facility admin coordinator Kenard Gower [phone], which was able to provide a direct number for the CSW to contact.   CSW spoke with Kenard Gower and resolved the pt's disposition matter. Drew noted despite the pt being discharged and arriving to the facility by way of her own/family transport, the FL2  will need to be completed by the hospital (which this Clinical research associate completed) and they will see it in the [employer].  Note: Kenard Gower noted she spoke with three the day before and today regarding the pt's disposition, no one documented the communication she had with them or to get in contact with this writer earlier int he day to update the CSW.  The CSW contacted the pt's daughter as she was with the patient, to update there to the information above and to note the pt is good to disharge and transfer today. Verda Cumins and the pt expressed their gratitude and noted they were pleased the pt can discharge to the facility. This Clinical research associate updated the RN to the information above. No other needs identified of this Clinical research associate. Please contact TOC if there is additional disposition support needed.     Patient Goals and CMS Choice   Discharge Placement    Discharge Plan and Services Additional resources added to the After Visit Summary for     DME Arranged: Walker rolling with seat DME Agency: [employer] Date DME Agency Contacted: 12/04/23 Time DME Agency Contacted: 1151 Representative spoke with at DME Agency: Vaughan Basta            Social Drivers of Health (SDOH) Interventions SDOH Screenings   Food Insecurity: No Food Insecurity (12/03/2023)  Housing: Low Risk  (12/03/2023)  Transportation Needs: No Transportation Needs (12/03/2023)  Utilities: Not At Risk (12/03/2023)  Alcohol Screen: Low Risk  (09/21/2023)  Depression (PHQ2-9): Low Risk  (06/30/2023)  Financial Resource Strain: Low Risk  (09/21/2023)  Physical Activity: Sufficiently Active (09/21/2023)  Social Connections: Moderately Integrated (12/03/2023)  Stress: No Stress Concern Present (09/21/2023)  Tobacco Use: Low Risk  (12/03/2023)  Health Literacy: Adequate Health Literacy (06/30/2023)     Readmission Risk Interventions     No data to display

## 2023-12-05 NOTE — Progress Notes (Signed)
 RN spoke to [place] at [facility] to give report on patient's current status. Patient is stable and ready to be discharged. Awaiting prescription for Norco as a hard copy is not present. [facility] Pharmacist, hospital also confirmed that the prescription is not there. Will consult with neuro

## 2023-12-05 NOTE — Progress Notes (Signed)
 Physical Therapy Treatment Patient Details Name: Tracy Miles MRN: [id number] DOB: 12-26-1933 Today's Date: 12/05/2023   History of Present Illness Pt is an 89 y/o female admitted 12/03/23 for elective left L3-4 decompressive laminotomy with resection of synovial cyst, microdissection with left L3-4 microdiscectomy. PMH includes Anemia, Aortic stenosis, Benign paroxysmal positional vertigo, CAD, DDD, Dysrhythmia, Hyperlipidemia, HTN, Obesity, OSA on CPAP, Pancreatitis, Persistent atrial fibrillation, Personal history of DVT (X 2), permanent cardiac pacemaker, Sleep apnea, and Spinal stenosis.    PT Comments  Pt required min assist bed mobility, min assist transfers, and min assist amb 40' with RW. Progressing well with mobility. Pt in recliner at end of session.     If plan is discharge home, recommend the following: A little help with walking and/or transfers;A little help with bathing/dressing/bathroom;Assistance with cooking/housework;Assist for transportation   Can travel by private vehicle     Yes  Equipment Recommendations  None recommended by PT    Recommendations for Other Services       Precautions / Restrictions Precautions Precautions: Back;Fall Recall of Precautions/Restrictions: Intact Precaution/Restrictions Comments: reviewed 3/3 back precautions, h/o L knee buckling Restrictions Other Position/Activity Restrictions: no brace needed     Mobility  Bed Mobility Overal bed mobility: Needs Assistance Bed Mobility: Rolling, Sidelying to Sit Rolling: Min assist, Used rails Sidelying to sit: Min assist, HOB elevated, Used rails       General bed mobility comments: cues for sequencing    Transfers Overall transfer level: Needs assistance Equipment used: Rolling walker (2 wheels) Transfers: Sit to/from Stand Sit to Stand: Min assist, From elevated surface           General transfer comment: cues for hand placement and sequencing     Ambulation/Gait Ambulation/Gait assistance: Min Chemical engineer (Feet): 40 Feet Assistive device: Rolling walker (2 wheels) Gait Pattern/deviations: Step-through pattern, Decreased stride length Gait velocity: decreased Gait velocity interpretation: <1.31 ft/sec, indicative of household ambulator   General Gait Details: no knee buckling noted, however, pt fearful   Stairs             Wheelchair Mobility     Tilt Bed    Modified Rankin (Stroke Patients Only)       Balance Overall balance assessment: Needs assistance Sitting-balance support: Feet supported, No upper extremity supported Sitting balance-Leahy Scale: Fair     Standing balance support: Bilateral upper extremity supported, During functional activity, Reliant on assistive device for balance Standing balance-Leahy Scale: Poor                              Communication Communication Communication: No apparent difficulties  Cognition Arousal: Alert Behavior During Therapy: WFL for tasks assessed/performed   PT - Cognitive impairments: No apparent impairments                         Following commands: Intact      Cueing Cueing Techniques: Verbal cues, Gestural cues  Exercises      General Comments        Pertinent Vitals/Pain Pain Assessment Pain Assessment: Faces Faces Pain Scale: Hurts a little bit Pain Location: back Pain Descriptors / Indicators: Discomfort Pain Intervention(s): Monitored during session, Repositioned    Home Living                          Prior Function  PT Goals (current goals can now be found in the care plan section) Acute Rehab PT Goals Patient Stated Goal: rehab then home Progress towards PT goals: Progressing toward goals    Frequency    Min 3X/week      PT Plan      Co-evaluation              AM-PAC PT "6 Clicks" Mobility   Outcome Measure  Help needed turning from your back to your  side while in a flat bed without using bedrails?: A Little Help needed moving from lying on your back to sitting on the side of a flat bed without using bedrails?: A Little Help needed moving to and from a bed to a chair (including a wheelchair)?: A Little Help needed standing up from a chair using your arms (e.g., wheelchair or bedside chair)?: A Little Help needed to walk in hospital room?: A Little Help needed climbing 3-5 steps with a railing? : A Lot 6 Click Score: 17    End of Session Equipment Utilized During Treatment: Gait belt Activity Tolerance: Patient tolerated treatment well Patient left: in chair;with call bell/phone within reach;with chair alarm set Nurse Communication: Mobility status PT Visit Diagnosis: Unsteadiness on feet (R26.81);Other abnormalities of gait and mobility (R26.89);Muscle weakness (generalized) (M62.81);Pain     Time: 4098-1191 PT Time Calculation (min) (ACUTE ONLY): 25 min  Charges:    $Gait Training: 23-37 mins PT General Charges $$ ACUTE PT VISIT: 1 Visit                     Ferd Glassing., PT  Office # [phone]    Ilda Foil 12/05/2023, 8:46 AM

## 2023-12-06 ENCOUNTER — Encounter (HOSPITAL_COMMUNITY): Payer: Self-pay | Admitting: Neurosurgery

## 2023-12-06 DIAGNOSIS — I110 Hypertensive heart disease with heart failure: Secondary | ICD-10-CM | POA: Diagnosis not present

## 2023-12-06 DIAGNOSIS — I4819 Other persistent atrial fibrillation: Secondary | ICD-10-CM | POA: Diagnosis not present

## 2023-12-06 DIAGNOSIS — E782 Mixed hyperlipidemia: Secondary | ICD-10-CM | POA: Diagnosis not present

## 2023-12-06 DIAGNOSIS — R41841 Cognitive communication deficit: Secondary | ICD-10-CM | POA: Diagnosis not present

## 2023-12-06 DIAGNOSIS — I5032 Chronic diastolic (congestive) heart failure: Secondary | ICD-10-CM | POA: Diagnosis not present

## 2023-12-06 DIAGNOSIS — I4891 Unspecified atrial fibrillation: Secondary | ICD-10-CM | POA: Diagnosis not present

## 2023-12-06 DIAGNOSIS — Z4789 Encounter for other orthopedic aftercare: Secondary | ICD-10-CM | POA: Diagnosis not present

## 2023-12-06 DIAGNOSIS — R262 Difficulty in walking, not elsewhere classified: Secondary | ICD-10-CM | POA: Diagnosis not present

## 2023-12-06 DIAGNOSIS — E559 Vitamin D deficiency, unspecified: Secondary | ICD-10-CM | POA: Diagnosis not present

## 2023-12-06 DIAGNOSIS — R2681 Unsteadiness on feet: Secondary | ICD-10-CM | POA: Diagnosis not present

## 2023-12-06 DIAGNOSIS — Z7409 Other reduced mobility: Secondary | ICD-10-CM | POA: Diagnosis not present

## 2023-12-06 MED FILL — Thrombin For Soln 5000 Unit: CUTANEOUS | Qty: 2 | Status: AC

## 2023-12-06 NOTE — Anesthesia Postprocedure Evaluation (Signed)
 Anesthesia Post Note  Patient: Tracy Miles  Procedure(s) Performed: LEFT LUMBAR THREE-FOUR LUMBAR LAMINECTOMY/DECOMPRESSION MICRODISCECTOMY (Left: Spine Lumbar)     Patient location during evaluation: PACU Anesthesia Type: General Level of consciousness: awake and alert Pain management: pain level controlled Vital Signs Assessment: post-procedure vital signs reviewed and stable Respiratory status: spontaneous breathing, nonlabored ventilation, respiratory function stable and patient connected to nasal cannula oxygen Cardiovascular status: blood pressure returned to baseline and stable Postop Assessment: no apparent nausea or vomiting Anesthetic complications: no   No notable events documented.  Last Vitals:  Vitals:   12/05/23 0808 12/05/23 0811  BP: (!) 145/81 (!) 148/75  Pulse: 74   Resp: 17   Temp: 36.5 C   SpO2: 98%     Last Pain:  Vitals:   12/05/23 1515  TempSrc:   PainSc: 2                  Nelle Don Amzie Sillas

## 2023-12-07 ENCOUNTER — Ambulatory Visit: Payer: Medicare Other | Admitting: Adult Health

## 2023-12-07 DIAGNOSIS — R262 Difficulty in walking, not elsewhere classified: Secondary | ICD-10-CM | POA: Diagnosis not present

## 2023-12-07 DIAGNOSIS — R41841 Cognitive communication deficit: Secondary | ICD-10-CM | POA: Diagnosis not present

## 2023-12-07 DIAGNOSIS — M519 Unspecified thoracic, thoracolumbar and lumbosacral intervertebral disc disorder: Secondary | ICD-10-CM | POA: Diagnosis not present

## 2023-12-07 DIAGNOSIS — Z950 Presence of cardiac pacemaker: Secondary | ICD-10-CM | POA: Diagnosis not present

## 2023-12-07 DIAGNOSIS — M5416 Radiculopathy, lumbar region: Secondary | ICD-10-CM | POA: Diagnosis not present

## 2023-12-07 DIAGNOSIS — R2681 Unsteadiness on feet: Secondary | ICD-10-CM | POA: Diagnosis not present

## 2023-12-07 DIAGNOSIS — Z7901 Long term (current) use of anticoagulants: Secondary | ICD-10-CM | POA: Diagnosis not present

## 2023-12-07 DIAGNOSIS — Z6841 Body Mass Index (BMI) 40.0 and over, adult: Secondary | ICD-10-CM | POA: Diagnosis not present

## 2023-12-07 DIAGNOSIS — I5030 Unspecified diastolic (congestive) heart failure: Secondary | ICD-10-CM | POA: Diagnosis not present

## 2023-12-07 DIAGNOSIS — K5900 Constipation, unspecified: Secondary | ICD-10-CM | POA: Diagnosis not present

## 2023-12-07 DIAGNOSIS — L299 Pruritus, unspecified: Secondary | ICD-10-CM | POA: Diagnosis not present

## 2023-12-07 DIAGNOSIS — I4811 Longstanding persistent atrial fibrillation: Secondary | ICD-10-CM | POA: Diagnosis not present

## 2023-12-07 DIAGNOSIS — G4733 Obstructive sleep apnea (adult) (pediatric): Secondary | ICD-10-CM | POA: Diagnosis not present

## 2023-12-07 DIAGNOSIS — Z4789 Encounter for other orthopedic aftercare: Secondary | ICD-10-CM | POA: Diagnosis not present

## 2023-12-07 DIAGNOSIS — Z9989 Dependence on other enabling machines and devices: Secondary | ICD-10-CM | POA: Diagnosis not present

## 2023-12-07 DIAGNOSIS — Z7409 Other reduced mobility: Secondary | ICD-10-CM | POA: Diagnosis not present

## 2023-12-07 DIAGNOSIS — I5032 Chronic diastolic (congestive) heart failure: Secondary | ICD-10-CM | POA: Diagnosis not present

## 2023-12-07 NOTE — Telephone Encounter (Signed)
 Called and spoke to Dru at [employer]. She said they had a doctor come and evaluate patient and they completed the [facility] form for her admission into the skilled nursing unit during her recovery.

## 2023-12-07 NOTE — Telephone Encounter (Signed)
 Called to get clarification, but didn't get an answer.

## 2023-12-08 DIAGNOSIS — Z7409 Other reduced mobility: Secondary | ICD-10-CM | POA: Diagnosis not present

## 2023-12-08 DIAGNOSIS — R41841 Cognitive communication deficit: Secondary | ICD-10-CM | POA: Diagnosis not present

## 2023-12-08 DIAGNOSIS — R262 Difficulty in walking, not elsewhere classified: Secondary | ICD-10-CM | POA: Diagnosis not present

## 2023-12-08 DIAGNOSIS — Z4789 Encounter for other orthopedic aftercare: Secondary | ICD-10-CM | POA: Diagnosis not present

## 2023-12-08 DIAGNOSIS — K5900 Constipation, unspecified: Secondary | ICD-10-CM | POA: Diagnosis not present

## 2023-12-08 DIAGNOSIS — R2681 Unsteadiness on feet: Secondary | ICD-10-CM | POA: Diagnosis not present

## 2023-12-08 DIAGNOSIS — I5032 Chronic diastolic (congestive) heart failure: Secondary | ICD-10-CM | POA: Diagnosis not present

## 2023-12-09 DIAGNOSIS — Z4789 Encounter for other orthopedic aftercare: Secondary | ICD-10-CM | POA: Diagnosis not present

## 2023-12-09 DIAGNOSIS — R41841 Cognitive communication deficit: Secondary | ICD-10-CM | POA: Diagnosis not present

## 2023-12-09 DIAGNOSIS — R262 Difficulty in walking, not elsewhere classified: Secondary | ICD-10-CM | POA: Diagnosis not present

## 2023-12-09 DIAGNOSIS — R2681 Unsteadiness on feet: Secondary | ICD-10-CM | POA: Diagnosis not present

## 2023-12-09 DIAGNOSIS — I5032 Chronic diastolic (congestive) heart failure: Secondary | ICD-10-CM | POA: Diagnosis not present

## 2023-12-09 DIAGNOSIS — Z7409 Other reduced mobility: Secondary | ICD-10-CM | POA: Diagnosis not present

## 2023-12-10 DIAGNOSIS — Z7409 Other reduced mobility: Secondary | ICD-10-CM | POA: Diagnosis not present

## 2023-12-10 DIAGNOSIS — R41841 Cognitive communication deficit: Secondary | ICD-10-CM | POA: Diagnosis not present

## 2023-12-10 DIAGNOSIS — R2681 Unsteadiness on feet: Secondary | ICD-10-CM | POA: Diagnosis not present

## 2023-12-10 DIAGNOSIS — Z4789 Encounter for other orthopedic aftercare: Secondary | ICD-10-CM | POA: Diagnosis not present

## 2023-12-10 DIAGNOSIS — I5032 Chronic diastolic (congestive) heart failure: Secondary | ICD-10-CM | POA: Diagnosis not present

## 2023-12-10 DIAGNOSIS — R262 Difficulty in walking, not elsewhere classified: Secondary | ICD-10-CM | POA: Diagnosis not present

## 2023-12-11 DIAGNOSIS — R2681 Unsteadiness on feet: Secondary | ICD-10-CM | POA: Diagnosis not present

## 2023-12-11 DIAGNOSIS — Z7409 Other reduced mobility: Secondary | ICD-10-CM | POA: Diagnosis not present

## 2023-12-11 DIAGNOSIS — Z4789 Encounter for other orthopedic aftercare: Secondary | ICD-10-CM | POA: Diagnosis not present

## 2023-12-11 DIAGNOSIS — R41841 Cognitive communication deficit: Secondary | ICD-10-CM | POA: Diagnosis not present

## 2023-12-11 DIAGNOSIS — R262 Difficulty in walking, not elsewhere classified: Secondary | ICD-10-CM | POA: Diagnosis not present

## 2023-12-11 DIAGNOSIS — I5032 Chronic diastolic (congestive) heart failure: Secondary | ICD-10-CM | POA: Diagnosis not present

## 2023-12-13 DIAGNOSIS — R262 Difficulty in walking, not elsewhere classified: Secondary | ICD-10-CM | POA: Diagnosis not present

## 2023-12-13 DIAGNOSIS — Z4789 Encounter for other orthopedic aftercare: Secondary | ICD-10-CM | POA: Diagnosis not present

## 2023-12-13 DIAGNOSIS — I5032 Chronic diastolic (congestive) heart failure: Secondary | ICD-10-CM | POA: Diagnosis not present

## 2023-12-13 DIAGNOSIS — R2681 Unsteadiness on feet: Secondary | ICD-10-CM | POA: Diagnosis not present

## 2023-12-13 DIAGNOSIS — Z7409 Other reduced mobility: Secondary | ICD-10-CM | POA: Diagnosis not present

## 2023-12-13 DIAGNOSIS — R41841 Cognitive communication deficit: Secondary | ICD-10-CM | POA: Diagnosis not present

## 2023-12-14 DIAGNOSIS — R262 Difficulty in walking, not elsewhere classified: Secondary | ICD-10-CM | POA: Diagnosis not present

## 2023-12-14 DIAGNOSIS — R2681 Unsteadiness on feet: Secondary | ICD-10-CM | POA: Diagnosis not present

## 2023-12-14 DIAGNOSIS — R41841 Cognitive communication deficit: Secondary | ICD-10-CM | POA: Diagnosis not present

## 2023-12-14 DIAGNOSIS — Z4789 Encounter for other orthopedic aftercare: Secondary | ICD-10-CM | POA: Diagnosis not present

## 2023-12-14 DIAGNOSIS — Z7409 Other reduced mobility: Secondary | ICD-10-CM | POA: Diagnosis not present

## 2023-12-14 DIAGNOSIS — I5032 Chronic diastolic (congestive) heart failure: Secondary | ICD-10-CM | POA: Diagnosis not present

## 2023-12-15 DIAGNOSIS — R262 Difficulty in walking, not elsewhere classified: Secondary | ICD-10-CM | POA: Diagnosis not present

## 2023-12-15 DIAGNOSIS — R41841 Cognitive communication deficit: Secondary | ICD-10-CM | POA: Diagnosis not present

## 2023-12-15 DIAGNOSIS — R2681 Unsteadiness on feet: Secondary | ICD-10-CM | POA: Diagnosis not present

## 2023-12-15 DIAGNOSIS — I5032 Chronic diastolic (congestive) heart failure: Secondary | ICD-10-CM | POA: Diagnosis not present

## 2023-12-15 DIAGNOSIS — Z7409 Other reduced mobility: Secondary | ICD-10-CM | POA: Diagnosis not present

## 2023-12-15 DIAGNOSIS — Z4789 Encounter for other orthopedic aftercare: Secondary | ICD-10-CM | POA: Diagnosis not present

## 2023-12-16 DIAGNOSIS — Z7409 Other reduced mobility: Secondary | ICD-10-CM | POA: Diagnosis not present

## 2023-12-16 DIAGNOSIS — Z4789 Encounter for other orthopedic aftercare: Secondary | ICD-10-CM | POA: Diagnosis not present

## 2023-12-16 DIAGNOSIS — R262 Difficulty in walking, not elsewhere classified: Secondary | ICD-10-CM | POA: Diagnosis not present

## 2023-12-16 DIAGNOSIS — R41841 Cognitive communication deficit: Secondary | ICD-10-CM | POA: Diagnosis not present

## 2023-12-16 DIAGNOSIS — R2681 Unsteadiness on feet: Secondary | ICD-10-CM | POA: Diagnosis not present

## 2023-12-16 DIAGNOSIS — I5032 Chronic diastolic (congestive) heart failure: Secondary | ICD-10-CM | POA: Diagnosis not present

## 2023-12-17 DIAGNOSIS — R2681 Unsteadiness on feet: Secondary | ICD-10-CM | POA: Diagnosis not present

## 2023-12-17 DIAGNOSIS — I110 Hypertensive heart disease with heart failure: Secondary | ICD-10-CM | POA: Diagnosis not present

## 2023-12-17 DIAGNOSIS — Z4789 Encounter for other orthopedic aftercare: Secondary | ICD-10-CM | POA: Diagnosis not present

## 2023-12-17 DIAGNOSIS — R41841 Cognitive communication deficit: Secondary | ICD-10-CM | POA: Diagnosis not present

## 2023-12-17 DIAGNOSIS — M5416 Radiculopathy, lumbar region: Secondary | ICD-10-CM | POA: Diagnosis not present

## 2023-12-17 DIAGNOSIS — Z7409 Other reduced mobility: Secondary | ICD-10-CM | POA: Diagnosis not present

## 2023-12-17 DIAGNOSIS — I482 Chronic atrial fibrillation, unspecified: Secondary | ICD-10-CM | POA: Diagnosis not present

## 2023-12-17 DIAGNOSIS — R262 Difficulty in walking, not elsewhere classified: Secondary | ICD-10-CM | POA: Diagnosis not present

## 2023-12-17 DIAGNOSIS — Z7901 Long term (current) use of anticoagulants: Secondary | ICD-10-CM | POA: Diagnosis not present

## 2023-12-17 DIAGNOSIS — I5032 Chronic diastolic (congestive) heart failure: Secondary | ICD-10-CM | POA: Diagnosis not present

## 2023-12-21 ENCOUNTER — Other Ambulatory Visit: Payer: Self-pay | Admitting: Family Medicine

## 2023-12-21 DIAGNOSIS — Z4789 Encounter for other orthopedic aftercare: Secondary | ICD-10-CM | POA: Diagnosis not present

## 2023-12-21 DIAGNOSIS — M6281 Muscle weakness (generalized): Secondary | ICD-10-CM | POA: Diagnosis not present

## 2023-12-21 DIAGNOSIS — R262 Difficulty in walking, not elsewhere classified: Secondary | ICD-10-CM | POA: Diagnosis not present

## 2023-12-23 ENCOUNTER — Ambulatory Visit: Payer: Medicare Other | Admitting: Family Medicine

## 2023-12-23 DIAGNOSIS — Z4789 Encounter for other orthopedic aftercare: Secondary | ICD-10-CM | POA: Diagnosis not present

## 2023-12-23 DIAGNOSIS — M6281 Muscle weakness (generalized): Secondary | ICD-10-CM | POA: Diagnosis not present

## 2023-12-23 DIAGNOSIS — R262 Difficulty in walking, not elsewhere classified: Secondary | ICD-10-CM | POA: Diagnosis not present

## 2023-12-24 ENCOUNTER — Ambulatory Visit: Admitting: Medical

## 2023-12-24 ENCOUNTER — Encounter: Payer: Self-pay | Admitting: Medical

## 2023-12-24 VITALS — BP 140/80 | HR 74 | Resp 18 | Ht 63.0 in | Wt 280.0 lb

## 2023-12-24 DIAGNOSIS — R21 Rash and other nonspecific skin eruption: Secondary | ICD-10-CM | POA: Diagnosis not present

## 2023-12-24 DIAGNOSIS — I5032 Chronic diastolic (congestive) heart failure: Secondary | ICD-10-CM | POA: Diagnosis not present

## 2023-12-24 DIAGNOSIS — I10 Essential (primary) hypertension: Secondary | ICD-10-CM | POA: Diagnosis not present

## 2023-12-24 DIAGNOSIS — M48061 Spinal stenosis, lumbar region without neurogenic claudication: Secondary | ICD-10-CM | POA: Diagnosis not present

## 2023-12-24 NOTE — Patient Instructions (Signed)
 Lumbar stenosis with radiculopathy Post-surgical follow-up after decompressive laminectomy and synovial cyst removal. No current radicular pain or leg weakness. Pain managed with Tylenol , tramadol , and tizanidine .(also has hydrocodone  if needed for severe pain) Pt knows not to use both tramadol  and hydrocodone  together) - Continue physical therapy twice a week. - Use walker as needed to prevent falls. - Manage pain with Tylenol  and tramadol  as needed.  Postoperative muscle spasms Experiencing muscle spasms post-surgery, particularly in the morning and at night. Managed with tizanidine . Advised to adjust dosage based on activity and time of day to minimize drowsiness. - Adjust tizanidine  dosage: use lower dosage in the morning and higher dosage at night if needed.  Itching post-surgery Experiencing itching on the back and neck post-surgery. No signs of diffuse sevrere allergic reaction. Moisturizing with Aveeno lotion. Advised against oral antihistamines due to sedative effects. - Continue moisturizing with Aveeno lotion. - Use Benadryl  gel for spot treatment of red bumps. - Consider low-dose steroid if rash becomes diffuse and persistent.  Follow up with pcp in about 2-3 weeks or sooner if needed  Hypertension Blood pressure is 140/80 mmHg. Managed with amlodipine  2.5 mg and lisinopril  10 mg.  Atrial fibrillation Managed with a diuretic and pacemaker. No signs of edema or lung congestion.  Chronic kidney disease, stage 3 Chronic kidney disease stage 3 with GFR in the 40 range. Kidney function numbers are consistent with historical values.  Follow-Up Follow-up appointment scheduled in 2-3 weeks with new provider working with Doctor Nathalie Baize. - No repeat labs needed at this time.

## 2023-12-24 NOTE — Progress Notes (Signed)
 Subjective:    Patient ID: Tracy Miles, female    DOB: February 07, 1934, 89 y.o.   MRN: [id number]  HPI Tracy Musick "Ninette Basque" is an 89 year old female with lumbar stenosis and radiculopathy who presents for a hospital follow-up after decompressive laminectomy.  She has a history of lumbar stenosis with radiculopathy, which led to severe pain and leg collapse in October, prompting a decompressive laminectomy. Prior to surgery, she experienced severe sciatic nerve pain and had a synovial cyst removed, identified on a CT scan. An MRI showed a ruptured disc. Post-surgery, she was discharged on December 05, 2023, and has been undergoing physical therapy twice a week.  She reports some back soreness after therapy sessions but no longer experiences shooting leg pain or leg weakness. She used a walker from October until the surgery but aims to discontinue its use eventually.  Her current medications include Tylenol  daily, tramadol  for moderate pain, and tizanidine  for muscle spasms, which occur mainly in the morning and at night. She avoids taking opioids when driving. She also experiences itching on her back and neck since the surgery, which she manages with moisturizing lotion.  No wheezing, shortness of breath, or tongue swelling. She has a history of atrial fibrillation and is on amlodipine  2.5 mg, lisinopril  10 mg, and Bumex , which she takes one tablet daily. She also has a pacemaker. Her blood pressure was noted to be 140/80, and she has a history of prediabetes. She is allergic to penicillin and sulfa drugs.        Recent hostpialization note below.  Admit date: 12/03/2023 Discharge date: 12/05/2023   Admission Diagnoses: Lumbar stenosis with radiculopathy          Discharge Diagnoses: Same     Discharged Condition: stable   Hospital Course: The patient was admitted on 12/03/2023 and taken to the operating room where the patient underwent aL 4 5 decompressive laminectomy for left  radiculopathy. The patient tolerated the procedure well and was taken to the recovery room and then to the fLOor in stable condition. The hospital course was routine. There were no complications. The wound remained clean dry and intact. Pt had appropriate back pain soreness.  He did complain of some right leg pain and her legs feel like it was going to give away when she walked at times.  She did well with therapy.  The patient remained afebrile with stable vital signs, and tolerated a regular diet. The patient continued to increase activities, and pain was well controlled with oral pain medications.   Agustina Aldrich, MD. Schedule an appointment as soon as possible for a visit in 2 week(s).   Specialty: Neurosurgery Contact information: [address] [place] [address] [phone]      Review of Systems  Constitutional:  Negative for chills, fatigue and fever.  HENT:  Negative for congestion, ear discharge and ear pain.   Respiratory:  Negative for cough, chest tightness, shortness of breath and wheezing.   Cardiovascular:  Negative for chest pain and palpitations.  Gastrointestinal:  Negative for abdominal pain, blood in stool and diarrhea.  Genitourinary:  Negative for dysuria, flank pain and frequency.  Musculoskeletal:  Positive for back pain.       Mild pain  Skin:        Wound healing well.  Neurological:  Negative for dizziness, weakness and numbness.  Hematological:  Negative for adenopathy.  Psychiatric/Behavioral:  Negative for behavioral problems and decreased concentration.     Past  Medical History:  Diagnosis Date   Allergy    Anemia    Aortic stenosis    Very mild; no AS on 01/12/22 TTE   Benign paroxysmal positional vertigo 12/17/2013   Chicken pox as a child   Colon polyp    Coronary artery disease    DDD (degenerative disc disease) 11/16/2013   Dysrhythmia    Hyperlipidemia    Hypertension    Iron deficiency anemia 11/13/2013   Mitral  regurgitation    moderate MR 01/12/22 TTE   Mumps child and teenager   OA (osteoarthritis) 11/19/2013   S/p L TKR   Obesity    OSA on CPAP    Pancreatitis    post hysterectomy   Persistent atrial fibrillation (HCC)    Personal history of colonic polyps 02/25/2014   Personal history of DVT (deep vein thrombosis) X 2   "left"   Presence of permanent cardiac pacemaker    Sleep apnea    Spinal stenosis      Social History   Socioeconomic History   Marital status: Widowed    Spouse name: Not on file   Number of children: 3   Years of education: Not on file   Highest education level: Bachelor's degree (e.g., BA, AB, BS)  Occupational History   Occupation: retired Runner, broadcasting/film/video  Tobacco Use   Smoking status: Never   Smokeless tobacco: Never   Tobacco comments:    never used tobacco  Vaping Use   Vaping status: Never Used  Substance and Sexual Activity   Alcohol  use: No   Drug use: No   Sexual activity: Never    Comment: lives at [place], low sodium diet  Other Topics Concern   Not on file  Social History Narrative   Not on file   Social Drivers of Health   Financial Resource Strain: Low Risk  (09/21/2023)   Overall Financial Resource Strain (CARDIA)    Difficulty of Paying Living Expenses: Not hard at all  Food Insecurity: No Food Insecurity (12/03/2023)   Hunger Vital Sign    Worried About Running Out of Food in the Last Year: Never true    Ran Out of Food in the Last Year: Never true  Transportation Needs: No Transportation Needs (12/03/2023)   PRAPARE - Administrator, Civil Service (Medical): No    Lack of Transportation (Non-Medical): No  Physical Activity: Sufficiently Active (09/21/2023)   Exercise Vital Sign    Days of Exercise per Week: 3 days    Minutes of Exercise per Session: 60 min  Stress: No Stress Concern Present (09/21/2023)   [employer] of Occupational Health - Occupational Stress Questionnaire    Feeling of Stress : Only a little   Social Connections: Moderately Integrated (12/03/2023)   Social Connection and Isolation Panel [NHANES]    Frequency of Communication with Friends and Family: More than three times a week    Frequency of Social Gatherings with Friends and Family: More than three times a week    Attends Religious Services: More than 4 times per year    Active Member of [employer] or Organizations: Yes    Attends Banker Meetings: More than 4 times per year    Marital Status: Widowed  Intimate Partner Violence: Not At Risk (12/03/2023)   Humiliation, Afraid, Rape, and Kick questionnaire    Fear of Current or Ex-Partner: No    Emotionally Abused: No    Physically Abused: No    Sexually Abused:  No    Past Surgical History:  Procedure Laterality Date   APPENDECTOMY     ATRIAL FIBRILLATION ABLATION N/A 04/25/2019   Procedure: ATRIAL FIBRILLATION ABLATION;  Surgeon: Jolly Needle, MD;  Location: [facility];  Service: Cardiovascular;  Laterality: N/A;   AV NODE ABLATION N/A 03/23/2022   Procedure: AV NODE ABLATION;  Surgeon: Boyce Byes, MD;  Location: [facility];  Service: Cardiovascular;  Laterality: N/A;   CARDIOVERSION N/A 08/09/2015   Procedure: CARDIOVERSION;  Surgeon: Hugh Madura, MD;  Location: [facility] ENDOSCOPY;  Service: Cardiovascular;  Laterality: N/A;   CARDIOVERSION N/A 09/30/2018   Procedure: CARDIOVERSION;  Surgeon: Liza Riggers, MD;  Location: [facility] ENDOSCOPY;  Service: Cardiovascular;  Laterality: N/A;   CARDIOVERSION N/A 02/27/2019   Procedure: CARDIOVERSION;  Surgeon: Sheryle Donning, MD;  Location: [facility] ENDOSCOPY;  Service: Cardiovascular;  Laterality: N/A;   CARDIOVERSION N/A 06/07/2019   Procedure: CARDIOVERSION;  Surgeon: Liza Riggers, MD;  Location: [facility] ENDOSCOPY;  Service: Cardiovascular;  Laterality: N/A;   CATARACT EXTRACTION W/ INTRAOCULAR LENS  IMPLANT, BILATERAL Bilateral 2016   CYST REMOVAL HAND Bilateral 1990's   "played to much golf"    DILATION AND CURETTAGE OF UTERUS     EYE SURGERY     implantable loop recorder placement  11/20/2019   Medtronic Reveal Linq model LNQ 22 (model number [id number]) implanted in office by Dr Nunzio Belch for afib management   JOINT REPLACEMENT Left 2013   knee   LUMBAR LAMINECTOMY  40 yrs ago   "L3-4"   LUMBAR LAMINECTOMY/DECOMPRESSION MICRODISCECTOMY Left 12/03/2023   Procedure: LEFT LUMBAR THREE-FOUR LUMBAR LAMINECTOMY/DECOMPRESSION MICRODISCECTOMY;  Surgeon: Agustina Aldrich, MD;  Location: [facility] OR;  Service: Neurosurgery;  Laterality: Left;  Microdiscectomy - left - L3-L4   MENISCUS REPAIR Bilateral 2000 and 2010   PACEMAKER IMPLANT N/A 02/06/2022   Procedure: PACEMAKER IMPLANT;  Surgeon: Boyce Byes, MD;  Location: [facility];  Service: Cardiovascular;  Laterality: N/A;   PACEMAKER INSERTION     REPLACEMENT TOTAL KNEE Left 2013   SPINE SURGERY     TEE WITHOUT CARDIOVERSION N/A 04/24/2019   Procedure: TRANSESOPHAGEAL ECHOCARDIOGRAM (TEE);  Surgeon: Alroy Aspen Lela Purple, MD;  Location: [facility] ENDOSCOPY;  Service: Cardiovascular;  Laterality: N/A;   TONSILECTOMY, ADENOIDECTOMY, BILATERAL MYRINGOTOMY AND TUBES  child   TOTAL ABDOMINAL HYSTERECTOMY  1995   had 2 tumors- benign   TUBAL LIGATION  89 years old    Family History  Problem Relation Age of Onset   Heart attack Mother 40   Hyperlipidemia Mother        ?   Dementia Mother    Pernicious anemia Mother    Heart disease Mother    COPD Father        smoker   Cancer Father 85       prostate   Heart disease Brother        quadruple bipass surgery   Hyperlipidemia Brother    Hypertension Brother    Diabetes Maternal Grandmother        type 2   Pernicious anemia Maternal Grandmother    Gout Son    Atrial fibrillation Son    Hyperlipidemia Son    Cancer Maternal Grandfather        liver   Cancer Paternal Grandmother        lung- doesn't think she smokes?   Stroke Paternal Grandfather    Cancer Son 73       non hodgin's  lymphoma   Gout Son    Hyperlipidemia Son    Sleep apnea Son    Heart disease Son    Colon cancer Neg Hx    Pancreatic cancer Neg Hx    Stomach cancer Neg Hx    Throat cancer Neg Hx    Liver disease Neg Hx     Allergies  Allergen Reactions   Gabapentin Swelling    Eyes and Face   Lactose Intolerance (Gi) Other (See Comments)    Bothers her stomach   Penicillins Hives and Rash    Did it involve swelling of the face/tongue/throat, SOB, or low BP? No Did it involve sudden or severe rash/hives, skin peeling, or any reaction on the inside of your mouth or nose? Yes Did you need to seek medical attention at a hospital or doctor's office? No When did it last happen?       25+ years If all above answers are "NO", may proceed with cephalosporin use.  Hives in throat     Sulfa Antibiotics Nausea Only and Rash   Vancomycin  Itching   Zebeta [Bisoprolol Fumarate] Nausea Only    Current Outpatient Medications on File Prior to Visit  Medication Sig Dispense Refill   acetaminophen  (TYLENOL ) 500 MG tablet Take 1,000 mg by mouth every 6 (six) hours as needed for moderate pain (pain score 4-6).     amLODipine  (NORVASC ) 2.5 MG tablet Take 1 tablet (2.5 mg total) by mouth daily. 90 tablet 1   atorvastatin  (LIPITOR) 20 MG tablet Take 1 tablet (20 mg total) by mouth daily. 90 tablet 1   bumetanide  (BUMEX ) 1 MG tablet TAKE 1 TABLET BY MOUTH DAILY 90 tablet 1   Calcium  Carb-Cholecalciferol  (CALCIUM  600 + D PO) Take 1 tablet by mouth 2 (two) times daily.     Carboxymethylcellul-Glycerin (LUBRICATING EYE DROPS OP) Place 1 drop into both eyes in the morning and at bedtime.     ELIQUIS  5 MG TABS tablet TAKE ONE TABLET EVERY MORNING AND AT BEDTIME 180 tablet 1   HYDROcodone -acetaminophen  (NORCO/VICODIN) 5-325 MG tablet Take 1 tablet by mouth every 4 (four) hours as needed for moderate pain (pain score 4-6) ((score 4 to 6)). 30 tablet 0   lisinopril  (ZESTRIL ) 10 MG tablet Take 1 tablet (10 mg total) by  mouth daily. 90 tablet 1   loratadine  (CLARITIN ) 10 MG tablet Take 10 mg by mouth daily.      Multiple Vitamins-Minerals (ONE-A-DAY WOMENS 50 PLUS PO) Take 1 tablet by mouth daily.     potassium chloride  (KLOR-CON ) 10 MEQ tablet TAKE 1 TABLET BY MOUTH IN  THE MORNING AND 2 TABLETS  BY MOUTH IN THE EVENING 270 tablet 3   Probiotic CAPS Take 1 capsule by mouth daily. Gummy     psyllium (REGULOID) 0.52 g capsule Take 1 capsule by mouth in the morning and at bedtime.     tiZANidine  (ZANAFLEX ) 2 MG tablet Take 0.5-2 tablets (1-4 mg total) by mouth every 8 (eight) hours as needed for muscle spasms. 40 tablet 2   traMADol  (ULTRAM ) 50 MG tablet Take 50 mg by mouth every 6 (six) hours as needed for moderate pain (pain score 4-6).     No current facility-administered medications on file prior to visit.    BP (!) 140/80   Pulse 74   Resp 18   Ht 5\' 3"  (1.6 m)   Wt 280 lb (127 kg)   SpO2 97%   BMI 49.60 kg/m  Objective:   Physical Exam   General Mental Status- Alert. General Appearance- Not in acute distress.   Skin General: Color- Normal Color. Moisture- Normal Moisture.  Neck Carotid Arteries- Normal color. Moisture- Normal Moisture. No carotid bruits. No JVD.  Chest and Lung Exam Auscultation: Breath Sounds:-Normal.  CTA  Cardiovascular Auscultation:Rythm- Regular. Murmurs & Other Heart Sounds:Auscultation of the heart reveals- No Murmurs.  Abdomen Inspection:-Inspeection Normal. Palpation/Percussion:Note:No mass. Palpation and Percussion of the abdomen reveal- Non Tender, Non Distended + BS, no rebound or guarding.  Back/skin- mid line lumbar spine scar well healed. No redness, no warmth and no dc.  Neurologic Cranial Nerve exam:- CN III-XII  grossly intact(No nystagmus), symmetric smile.      Assessment & Plan:   Patient Instructions  Lumbar stenosis with radiculopathy Post-surgical follow-up after decompressive laminectomy and synovial cyst removal. No  current radicular pain or leg weakness. Pain managed with Tylenol , tramadol , and tizanidine .(also has hydrocodone  if needed for severe pain) Pt knows not to use both tramadol  and hydrocodone  together) - Continue physical therapy twice a week. - Use walker as needed to prevent falls. - Manage pain with Tylenol  and tramadol  as needed.  Postoperative muscle spasms Experiencing muscle spasms post-surgery, particularly in the morning and at night. Managed with tizanidine . Advised to adjust dosage based on activity and time of day to minimize drowsiness. - Adjust tizanidine  dosage: use lower dosage in the morning and higher dosage at night if needed.  Itching post-surgery Experiencing itching on the back and neck post-surgery. No signs of diffuse sevrere allergic reaction. Moisturizing with Aveeno lotion. Advised against oral antihistamines due to sedative effects. - Continue moisturizing with Aveeno lotion. - Use Benadryl  gel for spot treatment of red bumps. - Consider low-dose steroid if rash becomes diffuse and persistent.  Follow up with pcp in about 2-3 weeks or sooner if needed  Hypertension Blood pressure is 140/80 mmHg. Managed with amlodipine  2.5 mg and lisinopril  10 mg.  Atrial fibrillation Managed with a diuretic and pacemaker. No signs of edema or lung congestion.  Chronic kidney disease, stage 3 Chronic kidney disease stage 3 with GFR in the 40 range. Kidney function numbers are consistent with historical values.  Follow-Up Follow-up appointment scheduled in 2-3 weeks with new provider working with Doctor Nathalie Baize. - No repeat labs needed at this time.   Tracy Sawtelle, PA-C

## 2023-12-28 DIAGNOSIS — Z4789 Encounter for other orthopedic aftercare: Secondary | ICD-10-CM | POA: Diagnosis not present

## 2023-12-28 DIAGNOSIS — M6281 Muscle weakness (generalized): Secondary | ICD-10-CM | POA: Diagnosis not present

## 2023-12-28 DIAGNOSIS — R262 Difficulty in walking, not elsewhere classified: Secondary | ICD-10-CM | POA: Diagnosis not present

## 2023-12-28 NOTE — Progress Notes (Signed)
 Remote pacemaker transmission.

## 2023-12-30 DIAGNOSIS — R262 Difficulty in walking, not elsewhere classified: Secondary | ICD-10-CM | POA: Diagnosis not present

## 2023-12-30 DIAGNOSIS — Z4789 Encounter for other orthopedic aftercare: Secondary | ICD-10-CM | POA: Diagnosis not present

## 2023-12-30 DIAGNOSIS — M6281 Muscle weakness (generalized): Secondary | ICD-10-CM | POA: Diagnosis not present

## 2024-01-04 ENCOUNTER — Other Ambulatory Visit: Payer: Self-pay | Admitting: Internal Medicine

## 2024-01-04 ENCOUNTER — Encounter: Payer: Self-pay | Admitting: Student

## 2024-01-04 ENCOUNTER — Ambulatory Visit (INDEPENDENT_AMBULATORY_CARE_PROVIDER_SITE_OTHER): Admitting: Student

## 2024-01-04 VITALS — BP 130/70 | HR 70 | Temp 98.1°F | Resp 14 | Ht 63.0 in | Wt 273.6 lb

## 2024-01-04 DIAGNOSIS — M25551 Pain in right hip: Secondary | ICD-10-CM | POA: Diagnosis not present

## 2024-01-04 DIAGNOSIS — I10 Essential (primary) hypertension: Secondary | ICD-10-CM | POA: Diagnosis not present

## 2024-01-04 MED ORDER — TIZANIDINE HCL 2 MG PO TABS: 1.0000 mg | ORAL_TABLET | Freq: Two times a day (BID) | ORAL | 0 refills | Status: DC | PRN

## 2024-01-04 MED ORDER — BUMETANIDE 1 MG PO TABS
1.0000 mg | ORAL_TABLET | Freq: Every day | ORAL | 0 refills | Status: DC
Start: 2024-01-04 — End: 2024-04-03

## 2024-01-04 MED ORDER — AMLODIPINE BESYLATE 2.5 MG PO TABS: 2.5000 mg | ORAL_TABLET | Freq: Every day | ORAL | 1 refills | Status: DC

## 2024-01-04 NOTE — Assessment & Plan Note (Signed)
 BP stable. Continue to monitor. Encouraged pt to check BP at home, heart healthy diet, DASH diet.

## 2024-01-04 NOTE — Assessment & Plan Note (Signed)
 Encouraged pt to condifer Tylenol  daily.

## 2024-01-04 NOTE — Progress Notes (Signed)
 Subjective:     Patient ID: Tracy Miles, female    DOB: July 26, 1934, 89 y.o.   MRN: [id number]  Chief Complaint  Patient presents with   4 month follow up   Medication Refill    HPI  PMHx- Prediabetes, HTN, CHF, A.Fib, OSA, HLD, IDA, DVT Hx, OA  Followed by Cardiology, NeuroSx  August 2020 pan ANCA panel negative, ANA negative, rheumatoid factor negative   Patient has a Hx of lumbar stenosis with radiculopathy with severe sciatic pain, she underwent decompressive laminectomy and synovial cyst removal and was discharged on 12/05/2023 and currently attending physical therapy twice weekly- scheduled through the month of May. Report sciatic pain is gone. Denies Bowl/ Bladder dysfunction. Denies Radicular pain. Ambulation with walker.  -Atrial fibrillation and is on amlodipine  2.5 mg, lisinopril  10 mg, and Bumex , which she takes one tablet daily. She also has a pacemaker. On Eliquis  5 mg BID.  Hypertension: Blood pressure today 130/70 mmHg. Managed with amlodipine  2.5 mg and lisinopril  10 mg.  Reports muscle "spasms" in Right Leg/ Hip area. Report pain when getting OOB in the morning.Pain 6/7. Right Hip. Feels like "grabbing" "spasms". Taking 1/2 tab AM and 1/2 tab PM before bed. This has been helping with the muscle spasm pain.  Reports taking medications as prescribed.  Patient denies fever, chills, SOB, CP, palpitations, dyspnea, edema, HA, vision changes, N/V/D, abdominal pain, urinary symptoms, rash, weight changes, falls, and recent illness or hospitalizations.   She is allergic to penicillin and sulfa drugs   History of Present Illness          There are no preventive care reminders to display for this patient.   Past Medical History:  Diagnosis Date   Allergy    Anemia    Aortic stenosis    Very mild; no AS on 01/12/22 TTE   Benign paroxysmal positional vertigo 12/17/2013   Chicken pox as a child   Colon polyp    Coronary artery disease    DDD (degenerative  disc disease) 11/16/2013   Dysrhythmia    Hyperlipidemia    Hypertension    Iron deficiency anemia 11/13/2013   Mitral regurgitation    moderate MR 01/12/22 TTE   Mumps child and teenager   OA (osteoarthritis) 11/19/2013   S/p L TKR   Obesity    OSA on CPAP    Pancreatitis    post hysterectomy   Persistent atrial fibrillation (HCC)    Personal history of colonic polyps 02/25/2014   Personal history of DVT (deep vein thrombosis) X 2   "left"   Presence of permanent cardiac pacemaker    Sleep apnea    Spinal stenosis     Past Surgical History:  Procedure Laterality Date   APPENDECTOMY     ATRIAL FIBRILLATION ABLATION N/A 04/25/2019   Procedure: ATRIAL FIBRILLATION ABLATION;  Surgeon: Jolly Needle, MD;  Location: [facility];  Service: Cardiovascular;  Laterality: N/A;   AV NODE ABLATION N/A 03/23/2022   Procedure: AV NODE ABLATION;  Surgeon: Boyce Byes, MD;  Location: [facility];  Service: Cardiovascular;  Laterality: N/A;   CARDIOVERSION N/A 08/09/2015   Procedure: CARDIOVERSION;  Surgeon: Hugh Madura, MD;  Location: [facility] ENDOSCOPY;  Service: Cardiovascular;  Laterality: N/A;   CARDIOVERSION N/A 09/30/2018   Procedure: CARDIOVERSION;  Surgeon: Liza Riggers, MD;  Location[facility] ENDOSCOPY;  Service: Cardiovascular;  Laterality: N/A;   CARDIOVERSION N/A 02/27/2019   Procedure: CARDIOVERSION;  Surgeon: Sheryle Donning, MD;  Location: [facility] ENDOSCOPY;  Service: Cardiovascular;  Laterality: N/A;   CARDIOVERSION N/A 06/07/2019   Procedure: CARDIOVERSION;  Surgeon: Liza Riggers, MD;  Location: [facility] ENDOSCOPY;  Service: Cardiovascular;  Laterality: N/A;   CATARACT EXTRACTION W/ INTRAOCULAR LENS  IMPLANT, BILATERAL Bilateral 2016   CYST REMOVAL HAND Bilateral 1990's   "played to much golf"   DILATION AND CURETTAGE OF UTERUS     EYE SURGERY     implantable loop recorder placement  11/20/2019   Medtronic Reveal Linq model LNQ 22 (model number  [id number]) implanted in office by Dr Nunzio Belch for afib management   JOINT REPLACEMENT Left 2013   knee   LUMBAR LAMINECTOMY  40 yrs ago   "L3-4"   LUMBAR LAMINECTOMY/DECOMPRESSION MICRODISCECTOMY Left 12/03/2023   Procedure: LEFT LUMBAR THREE-FOUR LUMBAR LAMINECTOMY/DECOMPRESSION MICRODISCECTOMY;  Surgeon: Agustina Aldrich, MD;  Location: [facility] OR;  Service: Neurosurgery;  Laterality: Left;  Microdiscectomy - left - L3-L4   MENISCUS REPAIR Bilateral 2000 and 2010   PACEMAKER IMPLANT N/A 02/06/2022   Procedure: PACEMAKER IMPLANT;  Surgeon: Boyce Byes, MD;  Location: [facility];  Service: Cardiovascular;  Laterality: N/A;   PACEMAKER INSERTION     REPLACEMENT TOTAL KNEE Left 2013   SPINE SURGERY     TEE WITHOUT CARDIOVERSION N/A 04/24/2019   Procedure: TRANSESOPHAGEAL ECHOCARDIOGRAM (TEE);  Surgeon: Alroy Aspen Lela Purple, MD;  Location: [facility] ENDOSCOPY;  Service: Cardiovascular;  Laterality: N/A;   TONSILECTOMY, ADENOIDECTOMY, BILATERAL MYRINGOTOMY AND TUBES  child   TOTAL ABDOMINAL HYSTERECTOMY  1995   had 2 tumors- benign   TUBAL LIGATION  89 years old    Family History  Problem Relation Age of Onset   Heart attack Mother 58   Hyperlipidemia Mother        ?   Dementia Mother    Pernicious anemia Mother    Heart disease Mother    COPD Father        smoker   Cancer Father 21       prostate   Heart disease Brother        quadruple bipass surgery   Hyperlipidemia Brother    Hypertension Brother    Diabetes Maternal Grandmother        type 2   Pernicious anemia Maternal Grandmother    Gout Son    Atrial fibrillation Son    Hyperlipidemia Son    Cancer Maternal Grandfather        liver   Cancer Paternal Grandmother        lung- doesn't think she smokes?   Stroke Paternal Grandfather    Cancer Son 48       non hodgin's lymphoma   Gout Son    Hyperlipidemia Son    Sleep apnea Son    Heart disease Son    Colon cancer Neg Hx    Pancreatic cancer Neg Hx    Stomach cancer Neg  Hx    Throat cancer Neg Hx    Liver disease Neg Hx     Social History   Socioeconomic History   Marital status: Widowed    Spouse name: Not on file   Number of children: 3   Years of education: Not on file   Highest education level: Bachelor's degree (e.g., BA, AB, BS)  Occupational History   Occupation: retired Runner, broadcasting/film/video  Tobacco Use   Smoking status: Never   Smokeless tobacco: Never   Tobacco comments:    never used tobacco  Vaping Use   Vaping  status: Never Used  Substance and Sexual Activity   Alcohol  use: No   Drug use: No   Sexual activity: Never    Comment: lives at [place], low sodium diet  Other Topics Concern   Not on file  Social History Narrative   her youngest son died suddenly and unexpectedly Dec 2023- MI   Social Drivers of Corporate investment banker Strain: Low Risk  (09/21/2023)   Overall Financial Resource Strain (CARDIA)    Difficulty of Paying Living Expenses: Not hard at all  Food Insecurity: No Food Insecurity (12/03/2023)   Hunger Vital Sign    Worried About Running Out of Food in the Last Year: Never true    Ran Out of Food in the Last Year: Never true  Transportation Needs: No Transportation Needs (12/03/2023)   PRAPARE - Administrator, Civil Service (Medical): No    Lack of Transportation (Non-Medical): No  Physical Activity: Sufficiently Active (09/21/2023)   Exercise Vital Sign    Days of Exercise per Week: 3 days    Minutes of Exercise per Session: 60 min  Stress: No Stress Concern Present (09/21/2023)   [employer] of Occupational Health - Occupational Stress Questionnaire    Feeling of Stress : Only a little  Social Connections: Moderately Integrated (12/03/2023)   Social Connection and Isolation Panel [NHANES]    Frequency of Communication with Friends and Family: More than three times a week    Frequency of Social Gatherings with Friends and Family: More than three times a week    Attends Religious Services: More  than 4 times per year    Active Member of [employer] or Organizations: Yes    Attends Banker Meetings: More than 4 times per year    Marital Status: Widowed  Intimate Partner Violence: Not At Risk (12/03/2023)   Humiliation, Afraid, Rape, and Kick questionnaire    Fear of Current or Ex-Partner: No    Emotionally Abused: No    Physically Abused: No    Sexually Abused: No    Outpatient Medications Prior to Visit  Medication Sig Dispense Refill   acetaminophen  (TYLENOL ) 500 MG tablet Take 1,000 mg by mouth every 6 (six) hours as needed for moderate pain (pain score 4-6).     atorvastatin  (LIPITOR) 20 MG tablet Take 1 tablet (20 mg total) by mouth daily. 90 tablet 1   Calcium  Carb-Cholecalciferol  (CALCIUM  600 + D PO) Take 1 tablet by mouth 2 (two) times daily.     Carboxymethylcellul-Glycerin (LUBRICATING EYE DROPS OP) Place 1 drop into both eyes in the morning and at bedtime.     ELIQUIS  5 MG TABS tablet TAKE ONE TABLET EVERY MORNING AND AT BEDTIME 180 tablet 1   lisinopril  (ZESTRIL ) 10 MG tablet Take 1 tablet (10 mg total) by mouth daily. 90 tablet 1   loratadine  (CLARITIN ) 10 MG tablet Take 10 mg by mouth daily.      Multiple Vitamins-Minerals (ONE-A-DAY WOMENS 50 PLUS PO) Take 1 tablet by mouth daily.     potassium chloride  (KLOR-CON ) 10 MEQ tablet TAKE 1 TABLET BY MOUTH IN  THE MORNING AND 2 TABLETS  BY MOUTH IN THE EVENING 270 tablet 3   Probiotic CAPS Take 1 capsule by mouth daily. Gummy     psyllium (REGULOID) 0.52 g capsule Take 1 capsule by mouth in the morning and at bedtime.     traMADol  (ULTRAM ) 50 MG tablet Take 50 mg by mouth every 6 (six) hours  as needed for moderate pain (pain score 4-6).     amLODipine  (NORVASC ) 2.5 MG tablet Take 1 tablet (2.5 mg total) by mouth daily. 90 tablet 1   bumetanide  (BUMEX ) 1 MG tablet TAKE 1 TABLET BY MOUTH DAILY 90 tablet 1   HYDROcodone -acetaminophen  (NORCO/VICODIN) 5-325 MG tablet Take 1 tablet by mouth every 4 (four) hours as needed  for moderate pain (pain score 4-6) ((score 4 to 6)). 30 tablet 0   tiZANidine  (ZANAFLEX ) 2 MG tablet Take 0.5-2 tablets (1-4 mg total) by mouth every 8 (eight) hours as needed for muscle spasms. 40 tablet 2   No facility-administered medications prior to visit.    Allergies  Allergen Reactions   Gabapentin Swelling    Eyes and Face   Lactose Intolerance (Gi) Other (See Comments)    Bothers her stomach   Penicillins Hives and Rash    Did it involve swelling of the face/tongue/throat, SOB, or low BP? No Did it involve sudden or severe rash/hives, skin peeling, or any reaction on the inside of your mouth or nose? Yes Did you need to seek medical attention at a hospital or doctor's office? No When did it last happen?       25+ years If all above answers are "NO", may proceed with cephalosporin use.  Hives in throat     Sulfa Antibiotics Nausea Only and Rash   Vancomycin  Itching   Zebeta [Bisoprolol Fumarate] Nausea Only    ROS See HPI    Objective:    Physical Exam  General Mental Status- Alert. General Appearance- Not in acute distress.    Skin General: Color- Normal Color. Moisture- Normal Moisture.   Neck Carotid Arteries- Normal color. Moisture- Normal Moisture. No carotid bruits. No JVD.   Chest and Lung Exam Breath Sounds:-Normal.    Cardiovascular Auscultation:Rythm- Regular. No Murmurs.    BP 130/70 (BP Location: Left Arm, Patient Position: Sitting, Cuff Size: Large)   Pulse 70   Temp 98.1 F (36.7 C) (Oral)   Resp 14   Ht 5\' 3"  (1.6 m)   Wt 273 lb 9.6 oz (124.1 kg)   SpO2 96%   BMI 48.47 kg/m  Wt Readings from Last 3 Encounters:  01/04/24 273 lb 9.6 oz (124.1 kg)  12/24/23 280 lb (127 kg)  12/03/23 281 lb (127.5 kg)       Assessment & Plan:   Problem List Items Addressed This Visit     Essential hypertension   BP stable. Continue to monitor. Encouraged pt to check BP at home, heart healthy diet, DASH diet.       Relevant Medications    bumetanide  (BUMEX ) 1 MG tablet   amLODipine  (NORVASC ) 2.5 MG tablet   Pain of right hip - Primary   Relevant Medications   tiZANidine  (ZANAFLEX ) 2 MG tablet  Encouraged use of Acetaminophen  500 mg every 6 hours as needed. Apply ice packs to the hip for 15-20 minutes several times daily for acute pain or swelling.Use heat therapy (e.g., warm compress or heating pad) for stiffness or chronic discomfort. Continue with PT. Encourage gradual weight loss to reduce joint strain and improve symptoms.Discuss healthy eating habits and physical activity as part of a long-term lifestyle plan.Consider referral to a dietitian.  Patient was educated on the diagnosis, treatment options, potential risks, benefits, and alternatives. All questions were addressed. Patient verbalized understanding and agrees with the plan of care. Will follow up as advised or sooner if symptoms worsen or new concerns arise.  I have discontinued Nellie Banas Martinez "Betty"'s tiZANidine  and HYDROcodone -acetaminophen . I have also changed her bumetanide . Additionally, I am having her start on tiZANidine . Lastly, I am having her maintain her loratadine , acetaminophen , Calcium  Carb-Cholecalciferol  (CALCIUM  600 + D PO), Probiotic, Carboxymethylcellul-Glycerin (LUBRICATING EYE DROPS OP), Multiple Vitamins-Minerals (ONE-A-DAY WOMENS 50 PLUS PO), potassium chloride , atorvastatin , lisinopril , traMADol , psyllium, Eliquis , and amLODipine .  Meds ordered this encounter  Medications   bumetanide  (BUMEX ) 1 MG tablet    Sig: Take 1 tablet (1 mg total) by mouth daily.    Dispense:  90 tablet    Refill:  0    Supervising Provider:   Randie Bustle A [[id number]]   amLODipine  (NORVASC ) 2.5 MG tablet    Sig: Take 1 tablet (2.5 mg total) by mouth daily.    Dispense:  90 tablet    Refill:  1    Supervising Provider:   Randie Bustle A [[id number]]   tiZANidine  (ZANAFLEX ) 2 MG tablet    Sig: Take 0.5 tablets (1 mg total) by mouth 2 (two) times daily as needed for  muscle spasms.    Dispense:  30 tablet    Refill:  0    Supervising Provider:   Randie Bustle A [[id number]]

## 2024-01-05 DIAGNOSIS — Z4789 Encounter for other orthopedic aftercare: Secondary | ICD-10-CM | POA: Diagnosis not present

## 2024-01-05 DIAGNOSIS — M6281 Muscle weakness (generalized): Secondary | ICD-10-CM | POA: Diagnosis not present

## 2024-01-05 DIAGNOSIS — R262 Difficulty in walking, not elsewhere classified: Secondary | ICD-10-CM | POA: Diagnosis not present

## 2024-01-07 DIAGNOSIS — R262 Difficulty in walking, not elsewhere classified: Secondary | ICD-10-CM | POA: Diagnosis not present

## 2024-01-07 DIAGNOSIS — M6281 Muscle weakness (generalized): Secondary | ICD-10-CM | POA: Diagnosis not present

## 2024-01-07 DIAGNOSIS — Z4789 Encounter for other orthopedic aftercare: Secondary | ICD-10-CM | POA: Diagnosis not present

## 2024-01-11 DIAGNOSIS — M6281 Muscle weakness (generalized): Secondary | ICD-10-CM | POA: Diagnosis not present

## 2024-01-11 DIAGNOSIS — R262 Difficulty in walking, not elsewhere classified: Secondary | ICD-10-CM | POA: Diagnosis not present

## 2024-01-11 DIAGNOSIS — Z4789 Encounter for other orthopedic aftercare: Secondary | ICD-10-CM | POA: Diagnosis not present

## 2024-01-13 DIAGNOSIS — M6281 Muscle weakness (generalized): Secondary | ICD-10-CM | POA: Diagnosis not present

## 2024-01-13 DIAGNOSIS — R262 Difficulty in walking, not elsewhere classified: Secondary | ICD-10-CM | POA: Diagnosis not present

## 2024-01-13 DIAGNOSIS — Z4789 Encounter for other orthopedic aftercare: Secondary | ICD-10-CM | POA: Diagnosis not present

## 2024-01-18 DIAGNOSIS — M6281 Muscle weakness (generalized): Secondary | ICD-10-CM | POA: Diagnosis not present

## 2024-01-18 DIAGNOSIS — R262 Difficulty in walking, not elsewhere classified: Secondary | ICD-10-CM | POA: Diagnosis not present

## 2024-01-18 DIAGNOSIS — Z4789 Encounter for other orthopedic aftercare: Secondary | ICD-10-CM | POA: Diagnosis not present

## 2024-01-20 ENCOUNTER — Ambulatory Visit (INDEPENDENT_AMBULATORY_CARE_PROVIDER_SITE_OTHER)

## 2024-01-20 ENCOUNTER — Encounter: Payer: Self-pay | Admitting: Adult Health

## 2024-01-20 ENCOUNTER — Ambulatory Visit (INDEPENDENT_AMBULATORY_CARE_PROVIDER_SITE_OTHER): Admitting: Adult Health

## 2024-01-20 VITALS — BP 153/87 | HR 73 | Ht 63.0 in | Wt 275.4 lb

## 2024-01-20 DIAGNOSIS — G4730 Sleep apnea, unspecified: Secondary | ICD-10-CM

## 2024-01-20 DIAGNOSIS — I5032 Chronic diastolic (congestive) heart failure: Secondary | ICD-10-CM | POA: Diagnosis not present

## 2024-01-20 DIAGNOSIS — J8410 Pulmonary fibrosis, unspecified: Secondary | ICD-10-CM

## 2024-01-20 DIAGNOSIS — J309 Allergic rhinitis, unspecified: Secondary | ICD-10-CM

## 2024-01-20 DIAGNOSIS — J301 Allergic rhinitis due to pollen: Secondary | ICD-10-CM

## 2024-01-20 NOTE — Assessment & Plan Note (Signed)
 Sleep apnea with excellent control on nocturnal BiPAP.  Has perceived benefit.  Continue on current settings.  Patient education given on BiPAP care  Plan  Patient Instructions  Great job with BIPAP  Keep up good work .  Work on healthy weight loss Do not drive if sleepy.  Activity as tolerated.  Claritin  10mg  daily As needed   Flonase nasal 1 puff daily as needed  Saline nasal spray Twice daily   Saline nasal gel At bedtime  As needed   Chest xray today  Follow up with Dr. Gaynell Keeler or Oland Arquette NP in 1 year and As needed

## 2024-01-20 NOTE — Progress Notes (Signed)
 @Patient  ID: Tracy Miles, female    DOB: 1933-11-16, 89 y.o.   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 Lives at [facility] in a private home.  TEST/EVENTS :  PSG 03/07/15 >had trouble sleeping due to back pain (total sleep time was only 243 min ) AHI 10.4./hr  (AHI during REM 74.7 /hr )  Low sat 84%. Severe PLMS.    August 2020 pan ANCA panel negative, ANA negative, rheumatoid factor negative   High-resolution CT chest March 31, 2019 mild basilar predominant subpleural fibrosis can be seen with amiodarone  use-nonspecific interstitial pneumonitis or early mild usual interstitial pneumonitis due to other etiologies.     ILD Workup  From [place] , lived in [place] for 56 yrs  Engineer, site, homemaker.  Second hand smoke as child  Frequent bronchitis as child and adult. (frequently long time to get over) . No official dx of Asthma .  Mild rhinitis  Previous Cat . (no bird/chickens)  No hot tub.  No recent travel. (previous international travel -nothing in last 10 yrs. ) No known autoimmune disease  No long term macrodantin. No chemo use. No methotrexate use.  On Amiodarone  in past for ~ 6 months , taken off 2014 due to eye issues.  No unusual hobbies . Occasional refinish furniture  Lives alone, does light house work .  Goes to indoor pool , walks in pool .  No dysphagia or choking or GERD .    ANA Arlee Lace Amie Bald neg .  Social history: Patient's son died in 02-07-2023 unexpectedly from heart attack   01/20/2024 Follow up : OSA , Pulmonary Fibrosis  Patient presents for a 1 year follow-up.  She has underlying sleep apnea on nocturnal BiPAP.  She says  overall she is doing well on BiPAP.  Wears her BiPAP every night.  Does not nights.  Does not sleep without it.  Feels that she benefits from BiPAP.  BiPAP download shows excellent compliance with 100% usage.  Daily average uses at 8 hours patient is on IPAP 16 EPAP 12 cm H2O.  (Auto BiPAP.).  She uses a nasal mask.  [employer] is the DME.  Since she is recovering from recent back surgery.  Had significant back pain from an impinged nerve/sciatica .  She says her pain is much better but she is currently in physical therapy to help regain strength.  Previous CT chest showed very mild ILD changes felt secondary to previous amiodarone  use.  She has no significant symptoms such as cough, shortness of breath.  Previous chest x-ray in 2022/02/06 showed no acute changes.  High-resolution CT chest 02-07-2019 showed mild basilar subpleural fibrosis.  We discussed getting a chest x-ray today.  She remains independent.  Lives at [facility] in a private home.  She is able to drive.  Has family close by.    Allergies  Allergen Reactions   Gabapentin Swelling    Eyes and Face   Lactose Intolerance (Gi) Other (See Comments)    Bothers her stomach   Penicillins Hives and Rash    Did it involve swelling of the face/tongue/throat, SOB, or low BP? No Did it involve sudden or severe rash/hives, skin peeling, or  any reaction on the inside of your mouth or nose? Yes Did you need to seek medical attention at a hospital or doctor's office? No When did it last happen?       25+ years If all above answers are "NO", may proceed with cephalosporin use.  Hives in throat     Sulfa Antibiotics Nausea Only and Rash   Vancomycin  Itching   Zebeta [Bisoprolol Fumarate] Nausea Only    Immunization History  Administered Date(s) Administered   Fluad Quad(high Dose 65+) 06/16/2019, 05/19/2021, 05/22/2022   Fluad Trivalent(High Dose 65+) 05/18/2023   Influenza, High Dose Seasonal PF 05/11/2017, 05/24/2018   Influenza,inj,Quad PF,6+  Mos 04/30/2016   Influenza-Unspecified 05/31/2013, 05/01/2014, 06/11/2015   Moderna Covid-19 Vaccine  Bivalent Booster 37yrs & up 05/26/2021, 01/20/2022   Moderna Sars-Covid-2 Vaccination 09/13/2019, 10/10/2019, 07/03/2020, 12/18/2020   Pfizer Covid-19 Vaccine Bivalent Booster 85yrs & up 06/12/2021   Pfizer(Comirnaty )Fall Seasonal Vaccine 12 years and older 07/14/2022, 05/18/2023   Pneumococcal Conjugate-13 04/02/2014   Pneumococcal Polysaccharide-23 08/31/1998, 09/06/2015   Respiratory Syncytial Virus Vaccine ,Recomb Aduvanted(Arexvy ) 05/18/2022   Tdap 07/31/2012, 09/17/2022   Zoster Recombinant(Shingrix) 04/19/2018, 06/27/2018   Zoster, Live 10/09/2015    Past Medical History:  Diagnosis Date   Allergy    Anemia    Aortic stenosis    Very mild; no AS on 01/12/22 TTE   Benign paroxysmal positional vertigo 12/17/2013   Chicken pox as a child   Colon polyp    Coronary artery disease    DDD (degenerative disc disease) 11/16/2013   Dysrhythmia    Hyperlipidemia    Hypertension    Iron deficiency anemia 11/13/2013   Mitral regurgitation    moderate MR 01/12/22 TTE   Mumps child and teenager   OA (osteoarthritis) 11/19/2013   S/p L TKR   Obesity    OSA on CPAP    Pancreatitis    post hysterectomy   Persistent atrial fibrillation (HCC)    Personal history of colonic polyps 02/25/2014   Personal history of DVT (deep vein thrombosis) X 2   "left"   Presence of permanent cardiac pacemaker    Sleep apnea    Spinal stenosis     Tobacco History: Social History   Tobacco Use  Smoking Status Never  Smokeless Tobacco Never  Tobacco Comments   never used tobacco   Counseling given: Not Answered Tobacco comments: never used tobacco   Outpatient Medications Prior to Visit  Medication Sig Dispense Refill   acetaminophen  (TYLENOL ) 500 MG tablet Take 1,000 mg by mouth every 6 (six) hours as needed for moderate pain (pain score 4-6).     amLODipine  (NORVASC ) 2.5 MG tablet Take 1  tablet (2.5 mg total) by mouth daily. 90 tablet 1   atorvastatin  (LIPITOR) 20 MG tablet Take 1 tablet (20 mg total) by mouth daily. 90 tablet 1   bumetanide  (BUMEX ) 1 MG tablet Take 1 tablet (1 mg total) by mouth daily. 90 tablet 0   Calcium  Carb-Cholecalciferol  (CALCIUM  600 + D PO) Take 1 tablet by mouth 2 (two) times daily.     Carboxymethylcellul-Glycerin (LUBRICATING EYE DROPS OP) Place 1 drop into both eyes in the morning and at bedtime.     ELIQUIS  5 MG TABS tablet TAKE ONE TABLET EVERY MORNING AND AT BEDTIME 180 tablet 1   lisinopril  (ZESTRIL ) 10 MG tablet Take 1 tablet (10 mg total) by mouth daily. 90 tablet 1   loratadine  (CLARITIN ) 10 MG tablet Take 10 mg by mouth daily.  Multiple Vitamins-Minerals (ONE-A-DAY WOMENS 50 PLUS PO) Take 1 tablet by mouth daily.     potassium chloride  (KLOR-CON ) 10 MEQ tablet TAKE 1 TABLET BY MOUTH IN  THE MORNING AND 2 TABLETS  BY MOUTH IN THE EVENING 270 tablet 3   Probiotic CAPS Take 1 capsule by mouth daily. Gummy     psyllium (REGULOID) 0.52 g capsule Take 1 capsule by mouth in the morning and at bedtime.     tiZANidine  (ZANAFLEX ) 2 MG tablet Take 0.5 tablets (1 mg total) by mouth 2 (two) times daily as needed for muscle spasms. 30 tablet 0   traMADol  (ULTRAM ) 50 MG tablet Take 50 mg by mouth every 6 (six) hours as needed for moderate pain (pain score 4-6).     No facility-administered medications prior to visit.     Review of Systems:   Constitutional:   No  weight loss, night sweats,  Fevers, chills, +fatigue, or  lassitude.  HEENT:   No headaches,  Difficulty swallowing,  Tooth/dental problems, or  Sore throat,                No sneezing, itching, ear ache, nasal congestion, post nasal drip,   CV:  No chest pain,  Orthopnea, PND, swelling in lower extremities, anasarca, dizziness, palpitations, syncope.   GI  No heartburn, indigestion, abdominal pain, nausea, vomiting, diarrhea, change in bowel habits, loss of appetite, bloody stools.    Resp:   No chest wall deformity  Skin: no rash or lesions.  GU: no dysuria, change in color of urine, no urgency or frequency.  No flank pain, no hematuria   MS: Recent back surgery General Weakness.   Physical Exam  BP (!) 153/87 (BP Location: Left Wrist, Patient Position: Sitting, Cuff Size: Normal)   Pulse 73   Ht 5\' 3"  (1.6 m)   Wt 275 lb 6.4 oz (124.9 kg)   SpO2 97%   BMI 48.78 kg/m   GEN: A/Ox3; pleasant , NAD, well nourished, rolling walker   HEENT:  [place]/AT,  EACs-clear, TMs-wnl, NOSE-clear, THROAT-clear, no lesions, no postnasal drip or exudate noted.   NECK:  Supple w/ fair ROM; no JVD; normal carotid impulses w/o bruits; no thyromegaly or nodules palpated; no lymphadenopathy.    RESP  Clear  P & A; w/o, wheezes/ rales/ or rhonchi. no accessory muscle use, no dullness to percussion  CARD:  RRR, no m/r/g, no peripheral edema, pulses intact, no cyanosis or clubbing.  GI:   Soft & nt; nml bowel sounds; no organomegaly or masses detected.   Musco: Warm bil, no deformities or joint swelling noted.   Neuro: alert, no focal deficits noted.    Skin: Warm, no lesions or rashes    Lab Results:   BMET    ProBNP No results found for: "PROBNP"  Imaging: No results found.  Administration History     None          Latest Ref Rng & Units 03/31/2019   10:01 AM 11/15/2013   11:35 AM  PFT Results  FVC-Pre L 1.94  P 2.24   FVC-Predicted Pre % 80  P 86   FVC-Post L 2.08  P 2.34   FVC-Predicted Post % 86  P 90   Pre FEV1/FVC % % 74  P 79   Post FEV1/FCV % % 76  P 83   FEV1-Pre L 1.42  P 1.78   FEV1-Predicted Pre % 80  P 92   FEV1-Post L 1.59  P 1.95  DLCO uncorrected ml/min/mmHg 19.74  P 14.34   DLCO UNC% % 109  P 62   DLCO corrected ml/min/mmHg 19.74  P   DLCO COR %Predicted % 109  P   DLVA Predicted % 125  P 78   TLC L 4.36  P 4.38   TLC % Predicted % 88  P 89   RV % Predicted % 94  P 87     P Preliminary result    No results found for:  "NITRICOXIDE"      Assessment & Plan:   Sleep apnea in adult Sleep apnea with excellent control on nocturnal BiPAP.  Has perceived benefit.  Continue on current settings.  Patient education given on BiPAP care  Plan  Patient Instructions  Great job with BIPAP  Keep up good work .  Work on healthy weight loss Do not drive if sleepy.  Activity as tolerated.  Claritin  10mg  daily As needed   Flonase nasal 1 puff daily as needed  Saline nasal spray Twice daily   Saline nasal gel At bedtime  As needed   Chest xray today  Follow up with Dr. Gaynell Keeler or Heli Dino NP in 1 year and As needed      Pulmonary fibrosis, unspecified (HCC) Mild basilar subpleural fibrosis possibly from previous amiodarone  use.  Patient has no significant symptom burden.  Will check chest x-ray today.  Allergic rhinitis Appears well-controlled.  Continue on current regimen  Chronic diastolic HF (heart failure) (HCC) Appears euvolemic on exam.  Continue current regimen     Roena Clark, NP 01/20/2024

## 2024-01-20 NOTE — Assessment & Plan Note (Signed)
 Mild basilar subpleural fibrosis possibly from previous amiodarone  use.  Patient has no significant symptom burden.  Will check chest x-ray today.

## 2024-01-20 NOTE — Assessment & Plan Note (Signed)
Appears euvolemic on exam.  Continue current regimen 

## 2024-01-20 NOTE — Patient Instructions (Signed)
 Great job with BIPAP  Keep up good work .  Work on healthy weight loss Do not drive if sleepy.  Activity as tolerated.  Claritin  10mg  daily As needed   Flonase nasal 1 puff daily as needed  Saline nasal spray Twice daily   Saline nasal gel At bedtime  As needed   Chest xray today  Follow up with Dr. Gaynell Keeler or Graves Nipp NP in 1 year and As needed

## 2024-01-20 NOTE — Assessment & Plan Note (Signed)
Appears well controlled.  Continue on current regimen.

## 2024-01-21 DIAGNOSIS — M6281 Muscle weakness (generalized): Secondary | ICD-10-CM | POA: Diagnosis not present

## 2024-01-21 DIAGNOSIS — R262 Difficulty in walking, not elsewhere classified: Secondary | ICD-10-CM | POA: Diagnosis not present

## 2024-01-21 DIAGNOSIS — Z4789 Encounter for other orthopedic aftercare: Secondary | ICD-10-CM | POA: Diagnosis not present

## 2024-01-25 DIAGNOSIS — Z4789 Encounter for other orthopedic aftercare: Secondary | ICD-10-CM | POA: Diagnosis not present

## 2024-01-25 DIAGNOSIS — R262 Difficulty in walking, not elsewhere classified: Secondary | ICD-10-CM | POA: Diagnosis not present

## 2024-01-25 DIAGNOSIS — M6281 Muscle weakness (generalized): Secondary | ICD-10-CM | POA: Diagnosis not present

## 2024-01-27 DIAGNOSIS — M6281 Muscle weakness (generalized): Secondary | ICD-10-CM | POA: Diagnosis not present

## 2024-01-27 DIAGNOSIS — R262 Difficulty in walking, not elsewhere classified: Secondary | ICD-10-CM | POA: Diagnosis not present

## 2024-01-27 DIAGNOSIS — Z4789 Encounter for other orthopedic aftercare: Secondary | ICD-10-CM | POA: Diagnosis not present

## 2024-01-28 ENCOUNTER — Ambulatory Visit: Payer: Self-pay | Admitting: Adult Health

## 2024-01-31 ENCOUNTER — Telehealth: Payer: Self-pay | Admitting: Cardiology

## 2024-01-31 NOTE — Telephone Encounter (Signed)
 Pt had Chest X-ray done 01/20/24 and they told her to call and have Dr Marven Slimmer to look at it. Please advise

## 2024-01-31 NOTE — Telephone Encounter (Signed)
 01/20/24 chest xray report notes a 90 degree turn in the device from implant.  If you actually compare the 02/06/22 and 02/08/22 chest xrays at/post implant - you will note that the turn actually occurred at that time.   Device/lead have been in stable position since and are working normally.  There has been no noted change in positioning since 02/08/22.  Transmissions and in office checks have been normal.  Reviewed with A. Tillery, PA-C who confirms no concerns - stable device and lead since 02/08/22.   Patient made aware and reassurance given.  Will continue to monitor device and contact if any concerns.

## 2024-02-01 DIAGNOSIS — R262 Difficulty in walking, not elsewhere classified: Secondary | ICD-10-CM | POA: Diagnosis not present

## 2024-02-01 DIAGNOSIS — Z4789 Encounter for other orthopedic aftercare: Secondary | ICD-10-CM | POA: Diagnosis not present

## 2024-02-01 DIAGNOSIS — M6281 Muscle weakness (generalized): Secondary | ICD-10-CM | POA: Diagnosis not present

## 2024-02-03 DIAGNOSIS — Z4789 Encounter for other orthopedic aftercare: Secondary | ICD-10-CM | POA: Diagnosis not present

## 2024-02-03 DIAGNOSIS — R262 Difficulty in walking, not elsewhere classified: Secondary | ICD-10-CM | POA: Diagnosis not present

## 2024-02-03 DIAGNOSIS — M6281 Muscle weakness (generalized): Secondary | ICD-10-CM | POA: Diagnosis not present

## 2024-02-04 ENCOUNTER — Other Ambulatory Visit (HOSPITAL_BASED_OUTPATIENT_CLINIC_OR_DEPARTMENT_OTHER): Payer: Self-pay

## 2024-02-04 ENCOUNTER — Ambulatory Visit: Admitting: Student

## 2024-02-04 ENCOUNTER — Encounter: Payer: Self-pay | Admitting: Student

## 2024-02-04 VITALS — BP 130/78 | HR 69 | Temp 98.1°F | Resp 16 | Ht 63.0 in | Wt 276.4 lb

## 2024-02-04 DIAGNOSIS — I10 Essential (primary) hypertension: Secondary | ICD-10-CM | POA: Diagnosis not present

## 2024-02-04 DIAGNOSIS — E782 Mixed hyperlipidemia: Secondary | ICD-10-CM

## 2024-02-04 DIAGNOSIS — B379 Candidiasis, unspecified: Secondary | ICD-10-CM | POA: Diagnosis not present

## 2024-02-04 DIAGNOSIS — I5032 Chronic diastolic (congestive) heart failure: Secondary | ICD-10-CM

## 2024-02-04 DIAGNOSIS — R739 Hyperglycemia, unspecified: Secondary | ICD-10-CM | POA: Diagnosis not present

## 2024-02-04 MED ORDER — NYSTATIN-TRIAMCINOLONE 100000-0.1 UNIT/GM-% EX OINT
1.0000 | TOPICAL_OINTMENT | Freq: Two times a day (BID) | CUTANEOUS | 0 refills | Status: AC
  Filled 2024-02-04: qty 30, 15d supply, fill #0

## 2024-02-04 NOTE — Assessment & Plan Note (Signed)
 BP stable. Continue to monitor. Encouraged pt to check BP at home, heart healthy diet, DASH diet.

## 2024-02-04 NOTE — Progress Notes (Signed)
 Subjective:     Patient ID: Tracy Miles, female    DOB: Apr 17, 1934, 89 y.o.   MRN: [id number]  Chief Complaint  Patient presents with   1 month follow up   rash under breast    HPI  Tracy Miles is a 89 yo female presents for follow up on chronic conditions.    Lumbar surgery in April 2025.  Followed by neuro surgery. She is currently doing PT twice a week and reports steady progress.  Hypertension Patient taking amlodipine  2.5 mg and lisinopril  10 mg she reports taking this daily. No Se. BP Readings from Last 1 Encounters:  02/04/24 130/78    A-fib Eliquis  5 mg BID.  Compliant with medication. No adverse SE.  CHF-followed by cardiology Bumex  1 mg tablet daily  HLD  Lipitor 20 mg daily.  Compliant with medication. No adverse  SEs   Additional c/o Redness under armpits, bilateral breast, groin. Patient reports itching and uncomfortable; no systemic symptoms.Onset 1 week ago.   Patient denies fever, chills, SOB, CP, palpitations, dyspnea, edema, HA, vision changes, N/V/D, abdominal pain, urinary symptoms, weight changes, and recent illness or hospitalizations.   History of Present Illness              There are no preventive care reminders to display for this patient.  Past Medical History:  Diagnosis Date   Allergy    Anemia    Aortic stenosis    Very mild; no AS on 01/12/22 TTE   Benign paroxysmal positional vertigo 12/17/2013   Chicken pox as a child   Colon polyp    Coronary artery disease    DDD (degenerative disc disease) 11/16/2013   Dysrhythmia    Hyperlipidemia    Hypertension    Iron deficiency anemia 11/13/2013   Mitral regurgitation    moderate MR 01/12/22 TTE   Mumps child and teenager   OA (osteoarthritis) 11/19/2013   S/p L TKR   Obesity    OSA on CPAP    Pancreatitis    post hysterectomy   Persistent atrial fibrillation (HCC)    Personal history of colonic polyps 02/25/2014   Personal history of DVT (deep vein  thrombosis) X 2   "left"   Presence of permanent cardiac pacemaker    Sleep apnea    Spinal stenosis     Past Surgical History:  Procedure Laterality Date   APPENDECTOMY     ATRIAL FIBRILLATION ABLATION N/A 04/25/2019   Procedure: ATRIAL FIBRILLATION ABLATION;  Surgeon: Jolly Needle, MD;  Location: [facility];  Service: Cardiovascular;  Laterality: N/A;   AV NODE ABLATION N/A 03/23/2022   Procedure: AV NODE ABLATION;  Surgeon: Boyce Byes, MD;  Location: [facility];  Service: Cardiovascular;  Laterality: N/A;   CARDIOVERSION N/A 08/09/2015   Procedure: CARDIOVERSION;  Surgeon: Hugh Madura, MD;  Location: [facility] ENDOSCOPY;  Service: Cardiovascular;  Laterality: N/A;   CARDIOVERSION N/A 09/30/2018   Procedure: CARDIOVERSION;  Surgeon: Liza Riggers, MD;  Location: [facility] ENDOSCOPY;  Service: Cardiovascular;  Laterality: N/A;   CARDIOVERSION N/A 02/27/2019   Procedure: CARDIOVERSION;  Surgeon: Sheryle Donning, MD;  Location: [facility] ENDOSCOPY;  Service: Cardiovascular;  Laterality: N/A;   CARDIOVERSION N/A 06/07/2019   Procedure: CARDIOVERSION;  Surgeon: Liza Riggers, MD;  Location: [facility] ENDOSCOPY;  Service: Cardiovascular;  Laterality: N/A;   CATARACT EXTRACTION W/ INTRAOCULAR LENS  IMPLANT, BILATERAL Bilateral 2016   CYST REMOVAL HAND Bilateral 1990's   "played to much golf"  DILATION AND CURETTAGE OF UTERUS     EYE SURGERY     implantable loop recorder placement  11/20/2019   Medtronic Reveal Linq model LNQ 22 (model number [id number]) implanted in office by Dr Nunzio Belch for afib management   JOINT REPLACEMENT Left 2013   knee   LUMBAR LAMINECTOMY  40 yrs ago   "L3-4"   LUMBAR LAMINECTOMY/DECOMPRESSION MICRODISCECTOMY Left 12/03/2023   Procedure: LEFT LUMBAR THREE-FOUR LUMBAR LAMINECTOMY/DECOMPRESSION MICRODISCECTOMY;  Surgeon: Agustina Aldrich, MD;  Location: [facility] OR;  Service: Neurosurgery;  Laterality: Left;  Microdiscectomy - left - L3-L4   MENISCUS REPAIR  Bilateral 2000 and 2010   PACEMAKER IMPLANT N/A 02/06/2022   Procedure: PACEMAKER IMPLANT;  Surgeon: Boyce Byes, MD;  Location: [facility];  Service: Cardiovascular;  Laterality: N/A;   PACEMAKER INSERTION     REPLACEMENT TOTAL KNEE Left 2013   SPINE SURGERY     TEE WITHOUT CARDIOVERSION N/A 04/24/2019   Procedure: TRANSESOPHAGEAL ECHOCARDIOGRAM (TEE);  Surgeon: Alroy Aspen Lela Purple, MD;  Location: [facility] ENDOSCOPY;  Service: Cardiovascular;  Laterality: N/A;   TONSILECTOMY, ADENOIDECTOMY, BILATERAL MYRINGOTOMY AND TUBES  child   TOTAL ABDOMINAL HYSTERECTOMY  1995   had 2 tumors- benign   TUBAL LIGATION  89 years old    Family History  Problem Relation Age of Onset   Heart attack Mother 65   Hyperlipidemia Mother        ?   Dementia Mother    Pernicious anemia Mother    Heart disease Mother    COPD Father        smoker   Cancer Father 66       prostate   Heart disease Brother        quadruple bipass surgery   Hyperlipidemia Brother    Hypertension Brother    Diabetes Maternal Grandmother        type 2   Pernicious anemia Maternal Grandmother    Gout Son    Atrial fibrillation Son    Hyperlipidemia Son    Cancer Maternal Grandfather        liver   Cancer Paternal Grandmother        lung- doesn't think she smokes?   Stroke Paternal Grandfather    Cancer Son 84       non hodgin's lymphoma   Gout Son    Hyperlipidemia Son    Sleep apnea Son    Heart disease Son    Colon cancer Neg Hx    Pancreatic cancer Neg Hx    Stomach cancer Neg Hx    Throat cancer Neg Hx    Liver disease Neg Hx     Social History   Socioeconomic History   Marital status: Widowed    Spouse name: Not on file   Number of children: 3   Years of education: Not on file   Highest education level: Bachelor's degree (e.g., BA, AB, BS)  Occupational History   Occupation: retired Runner, broadcasting/film/video  Tobacco Use   Smoking status: Never   Smokeless tobacco: Never   Tobacco comments:    never used  tobacco  Vaping Use   Vaping status: Never Used  Substance and Sexual Activity   Alcohol  use: No   Drug use: No   Sexual activity: Never    Comment: lives at [place], low sodium diet  Other Topics Concern   Not on file  Social History Narrative   her youngest son died suddenly and unexpectedly Dec 2023- MI  Social Drivers of Corporate investment banker Strain: Low Risk  (09/21/2023)   Overall Financial Resource Strain (CARDIA)    Difficulty of Paying Living Expenses: Not hard at all  Food Insecurity: No Food Insecurity (12/03/2023)   Hunger Vital Sign    Worried About Running Out of Food in the Last Year: Never true    Ran Out of Food in the Last Year: Never true  Transportation Needs: No Transportation Needs (12/03/2023)   PRAPARE - Administrator, Civil Service (Medical): No    Lack of Transportation (Non-Medical): No  Physical Activity: Sufficiently Active (09/21/2023)   Exercise Vital Sign    Days of Exercise per Week: 3 days    Minutes of Exercise per Session: 60 min  Stress: No Stress Concern Present (09/21/2023)   [employer] of Occupational Health - Occupational Stress Questionnaire    Feeling of Stress : Only a little  Social Connections: Moderately Integrated (12/03/2023)   Social Connection and Isolation Panel [NHANES]    Frequency of Communication with Friends and Family: More than three times a week    Frequency of Social Gatherings with Friends and Family: More than three times a week    Attends Religious Services: More than 4 times per year    Active Member of [employer] or Organizations: Yes    Attends Banker Meetings: More than 4 times per year    Marital Status: Widowed  Intimate Partner Violence: Not At Risk (12/03/2023)   Humiliation, Afraid, Rape, and Kick questionnaire    Fear of Current or Ex-Partner: No    Emotionally Abused: No    Physically Abused: No    Sexually Abused: No    Outpatient Medications Prior to Visit   Medication Sig Dispense Refill   acetaminophen  (TYLENOL ) 500 MG tablet Take 1,000 mg by mouth every 6 (six) hours as needed for moderate pain (pain score 4-6).     amLODipine  (NORVASC ) 2.5 MG tablet Take 1 tablet (2.5 mg total) by mouth daily. 90 tablet 1   atorvastatin  (LIPITOR) 20 MG tablet Take 1 tablet (20 mg total) by mouth daily. 90 tablet 1   bumetanide  (BUMEX ) 1 MG tablet Take 1 tablet (1 mg total) by mouth daily. 90 tablet 0   Calcium  Carb-Cholecalciferol  (CALCIUM  600 + D PO) Take 1 tablet by mouth 2 (two) times daily.     Carboxymethylcellul-Glycerin (LUBRICATING EYE DROPS OP) Place 1 drop into both eyes in the morning and at bedtime.     ELIQUIS  5 MG TABS tablet TAKE ONE TABLET EVERY MORNING AND AT BEDTIME 180 tablet 1   lisinopril  (ZESTRIL ) 10 MG tablet Take 1 tablet (10 mg total) by mouth daily. 90 tablet 1   loratadine  (CLARITIN ) 10 MG tablet Take 10 mg by mouth daily.      Multiple Vitamins-Minerals (ONE-A-DAY WOMENS 50 PLUS PO) Take 1 tablet by mouth daily.     potassium chloride  (KLOR-CON ) 10 MEQ tablet TAKE 1 TABLET BY MOUTH IN  THE MORNING AND 2 TABLETS  BY MOUTH IN THE EVENING 270 tablet 3   Probiotic CAPS Take 1 capsule by mouth daily. Gummy     psyllium (REGULOID) 0.52 g capsule Take 1 capsule by mouth in the morning and at bedtime.     tiZANidine  (ZANAFLEX ) 2 MG tablet Take 0.5 tablets (1 mg total) by mouth 2 (two) times daily as needed for muscle spasms. 30 tablet 0   traMADol  (ULTRAM ) 50 MG tablet Take 50 mg by mouth  every 6 (six) hours as needed for moderate pain (pain score 4-6).     No facility-administered medications prior to visit.    Allergies  Allergen Reactions   Gabapentin Swelling    Eyes and Face   Lactose Intolerance (Gi) Other (See Comments)    Bothers her stomach   Penicillins Hives and Rash    Did it involve swelling of the face/tongue/throat, SOB, or low BP? No Did it involve sudden or severe rash/hives, skin peeling, or any reaction on the  inside of your mouth or nose? Yes Did you need to seek medical attention at a hospital or doctor's office? No When did it last happen?       25+ years If all above answers are "NO", may proceed with cephalosporin use.  Hives in throat     Sulfa Antibiotics Nausea Only and Rash   Vancomycin  Itching   Zebeta [Bisoprolol Fumarate] Nausea Only    ROS See HPI    Objective:     Physical Exam  General Mental Status- Alert. General Appearance- Not in acute distress.    Skin Warm, Dry + Erythematous, moist rash noted under breasts, underarms, and in groin area.     Neck Carotid Arteries- Normal color. Moisture- Normal Moisture. No carotid bruits. No JVD.   Chest and Lung Exam Breath Sounds:-Normal.  CTAB   Cardiovascular Auscultation:RRR Murmurs & Other Heart Sounds: No Murmurs.        BP 130/78 (BP Location: Right Arm, Patient Position: Sitting, Cuff Size: Large)   Pulse 69   Temp 98.1 F (36.7 C) (Oral)   Resp 16   Ht 5\' 3"  (1.6 m)   Wt 276 lb 6.4 oz (125.4 kg)   SpO2 94%   BMI 48.96 kg/m  Wt Readings from Last 3 Encounters:  02/04/24 276 lb 6.4 oz (125.4 kg)  01/20/24 275 lb 6.4 oz (124.9 kg)  01/04/24 273 lb 9.6 oz (124.1 kg)       Assessment & Plan:   Problem List Items Addressed This Visit     Candida albicans infection   nyastatin-triamcinolone  ointment twice daily. Keep underarms, under breasts, and groin areas clean and dry by gently washing daily and thoroughly drying after showering. Wear loose, breathable clothing and change out of damp clothes promptly.  RTC clinic if symptoms persist or worsen.             Relevant Medications   nystatin-triamcinolone  ointment (MYCOLOG)   Chronic diastolic HF (heart failure) (HCC) - Primary   Essential hypertension   BP stable. Continue to monitor. Encouraged pt to check BP at home, heart healthy diet, DASH diet.       Relevant Orders   Comprehensive metabolic panel with GFR   CBC with  Differential/Platelet   TSH   Hyperglycemia   Check A1c today. Minimize simple carbs. Increase exercise as tolerated.       Relevant Orders   Hemoglobin A1c   Hyperlipidemia, mixed   Tolerating Lipitor 20 mg daily.  Encouraged heart healthy diet avoids trans fats minimize simple carbohydrates and saturated fats. Exercise as tolerated.      Relevant Orders   Lipid panel   Hemoglobin A1c    I am having [address] "Tracy Miles" start on nystatin-triamcinolone  ointment. I am also having her maintain her loratadine , acetaminophen , Calcium  Carb-Cholecalciferol  (CALCIUM  600 + D PO), Probiotic, Carboxymethylcellul-Glycerin (LUBRICATING EYE DROPS OP), Multiple Vitamins-Minerals (ONE-A-DAY WOMENS 50 PLUS PO), potassium chloride , atorvastatin , lisinopril , traMADol , psyllium, Eliquis , bumetanide , amLODipine , and tiZANidine .  Meds ordered this encounter  Medications   nystatin-triamcinolone  ointment (MYCOLOG)    Sig: Apply to affected area twice daily as directed.    Dispense:  30 g    Refill:  0    Supervising Provider:   Randie Bustle A [[id number]]

## 2024-02-04 NOTE — Assessment & Plan Note (Addendum)
 nyastatin-triamcinolone  ointment twice daily. Keep underarms, under breasts, and groin areas clean and dry by gently washing daily and thoroughly drying after showering. Wear loose, breathable clothing and change out of damp clothes promptly.  RTC clinic if symptoms persist or worsen.

## 2024-02-04 NOTE — Assessment & Plan Note (Signed)
 Check A1c today. Minimize simple carbs. Increase exercise as tolerated.

## 2024-02-04 NOTE — Assessment & Plan Note (Signed)
 Tolerating Lipitor 20 mg daily.  Encouraged heart healthy diet avoids trans fats minimize simple carbohydrates and saturated fats. Exercise as tolerated.

## 2024-02-05 LAB — CBC WITH DIFFERENTIAL/PLATELET
Absolute Lymphocytes: 1315 {cells}/uL (ref 850–3900)
Absolute Monocytes: 462 {cells}/uL (ref 200–950)
Basophils Absolute: 39 {cells}/uL (ref 0–200)
Basophils Relative: 0.7 %
Eosinophils Absolute: 231 {cells}/uL (ref 15–500)
Eosinophils Relative: 4.2 %
HCT: 38.4 % (ref 35.0–45.0)
Hemoglobin: 12.7 g/dL (ref 11.7–15.5)
MCH: 32.8 pg (ref 27.0–33.0)
MCHC: 33.1 g/dL (ref 32.0–36.0)
MCV: 99.2 fL (ref 80.0–100.0)
MPV: 9.8 fL (ref 7.5–12.5)
Monocytes Relative: 8.4 %
Neutro Abs: 3454 {cells}/uL (ref 1500–7800)
Neutrophils Relative %: 62.8 %
Platelets: 249 10*3/uL (ref 140–400)
RBC: 3.87 10*6/uL (ref 3.80–5.10)
RDW: 12.1 % (ref 11.0–15.0)
Total Lymphocyte: 23.9 %
WBC: 5.5 10*3/uL (ref 3.8–10.8)

## 2024-02-05 LAB — COMPREHENSIVE METABOLIC PANEL WITH GFR
AG Ratio: 1.9 (calc) (ref 1.0–2.5)
ALT: 10 U/L (ref 6–29)
AST: 16 U/L (ref 10–35)
Albumin: 4.2 g/dL (ref 3.6–5.1)
Alkaline phosphatase (APISO): 71 U/L (ref 37–153)
BUN/Creatinine Ratio: 18 (calc) (ref 6–22)
BUN: 21 mg/dL (ref 7–25)
CO2: 27 mmol/L (ref 20–32)
Calcium: 9.2 mg/dL (ref 8.6–10.4)
Chloride: 103 mmol/L (ref 98–110)
Creat: 1.15 mg/dL — ABNORMAL HIGH (ref 0.60–0.95)
Globulin: 2.2 g/dL (ref 1.9–3.7)
Glucose, Bld: 100 mg/dL — ABNORMAL HIGH (ref 65–99)
Potassium: 4.2 mmol/L (ref 3.5–5.3)
Sodium: 141 mmol/L (ref 135–146)
Total Bilirubin: 0.6 mg/dL (ref 0.2–1.2)
Total Protein: 6.4 g/dL (ref 6.1–8.1)
eGFR: 46 mL/min/{1.73_m2} — ABNORMAL LOW (ref >=60)

## 2024-02-05 LAB — LIPID PANEL
Cholesterol: 152 mg/dL (ref <200)
HDL: 47 mg/dL — ABNORMAL LOW (ref >=50)
LDL Cholesterol (Calc): 73 mg/dL
Non-HDL Cholesterol (Calc): 105 mg/dL (ref <130)
Total CHOL/HDL Ratio: 3.2 (calc) (ref <5.0)
Triglycerides: 231 mg/dL — ABNORMAL HIGH (ref <150)

## 2024-02-05 LAB — HEMOGLOBIN A1C
Hgb A1c MFr Bld: 5.9 % — ABNORMAL HIGH (ref <5.7)
Mean Plasma Glucose: 123 mg/dL
eAG (mmol/L): 6.8 mmol/L

## 2024-02-05 LAB — TSH: TSH: 2.21 m[IU]/L (ref 0.40–4.50)

## 2024-02-07 ENCOUNTER — Ambulatory Visit: Payer: Self-pay | Admitting: Student

## 2024-02-08 ENCOUNTER — Ambulatory Visit (INDEPENDENT_AMBULATORY_CARE_PROVIDER_SITE_OTHER): Payer: Medicare Other

## 2024-02-08 DIAGNOSIS — I4892 Unspecified atrial flutter: Secondary | ICD-10-CM

## 2024-02-08 DIAGNOSIS — M6281 Muscle weakness (generalized): Secondary | ICD-10-CM | POA: Diagnosis not present

## 2024-02-08 DIAGNOSIS — Z4789 Encounter for other orthopedic aftercare: Secondary | ICD-10-CM | POA: Diagnosis not present

## 2024-02-08 DIAGNOSIS — R262 Difficulty in walking, not elsewhere classified: Secondary | ICD-10-CM | POA: Diagnosis not present

## 2024-02-09 LAB — CUP PACEART REMOTE DEVICE CHECK
Battery Remaining Longevity: 115 mo
Battery Remaining Percentage: 87 %
Battery Voltage: 3.02 V
Brady Statistic RV Percent Paced: 99 %
Date Time Interrogation Session: 20250610020427
Implantable Lead Connection Status: 753985
Implantable Lead Implant Date: 20230609
Implantable Lead Location: 753860
Implantable Pulse Generator Implant Date: 20230609
Lead Channel Impedance Value: 510 Ohm
Lead Channel Pacing Threshold Amplitude: 0.625 V
Lead Channel Pacing Threshold Pulse Width: 0.5 ms
Lead Channel Sensing Intrinsic Amplitude: 12 mV
Lead Channel Setting Pacing Amplitude: 0.875
Lead Channel Setting Pacing Pulse Width: 0.5 ms
Lead Channel Setting Sensing Sensitivity: 4 mV
Pulse Gen Model: 1272
Pulse Gen Serial Number: 8068284

## 2024-02-10 DIAGNOSIS — M6281 Muscle weakness (generalized): Secondary | ICD-10-CM | POA: Diagnosis not present

## 2024-02-10 DIAGNOSIS — Z4789 Encounter for other orthopedic aftercare: Secondary | ICD-10-CM | POA: Diagnosis not present

## 2024-02-10 DIAGNOSIS — R262 Difficulty in walking, not elsewhere classified: Secondary | ICD-10-CM | POA: Diagnosis not present

## 2024-02-12 ENCOUNTER — Ambulatory Visit: Payer: Self-pay | Admitting: Cardiology

## 2024-02-14 DIAGNOSIS — Z4789 Encounter for other orthopedic aftercare: Secondary | ICD-10-CM | POA: Diagnosis not present

## 2024-02-14 DIAGNOSIS — R262 Difficulty in walking, not elsewhere classified: Secondary | ICD-10-CM | POA: Diagnosis not present

## 2024-02-14 DIAGNOSIS — M6281 Muscle weakness (generalized): Secondary | ICD-10-CM | POA: Diagnosis not present

## 2024-02-16 DIAGNOSIS — Z4789 Encounter for other orthopedic aftercare: Secondary | ICD-10-CM | POA: Diagnosis not present

## 2024-02-16 DIAGNOSIS — M6281 Muscle weakness (generalized): Secondary | ICD-10-CM | POA: Diagnosis not present

## 2024-02-16 DIAGNOSIS — R262 Difficulty in walking, not elsewhere classified: Secondary | ICD-10-CM | POA: Diagnosis not present

## 2024-02-27 ENCOUNTER — Other Ambulatory Visit: Payer: Self-pay | Admitting: Family Medicine

## 2024-02-29 DIAGNOSIS — R262 Difficulty in walking, not elsewhere classified: Secondary | ICD-10-CM | POA: Diagnosis not present

## 2024-02-29 DIAGNOSIS — Z4789 Encounter for other orthopedic aftercare: Secondary | ICD-10-CM | POA: Diagnosis not present

## 2024-02-29 DIAGNOSIS — M6281 Muscle weakness (generalized): Secondary | ICD-10-CM | POA: Diagnosis not present

## 2024-03-07 DIAGNOSIS — R262 Difficulty in walking, not elsewhere classified: Secondary | ICD-10-CM | POA: Diagnosis not present

## 2024-03-07 DIAGNOSIS — M6281 Muscle weakness (generalized): Secondary | ICD-10-CM | POA: Diagnosis not present

## 2024-03-07 DIAGNOSIS — Z4789 Encounter for other orthopedic aftercare: Secondary | ICD-10-CM | POA: Diagnosis not present

## 2024-03-14 DIAGNOSIS — R262 Difficulty in walking, not elsewhere classified: Secondary | ICD-10-CM | POA: Diagnosis not present

## 2024-03-14 DIAGNOSIS — M6281 Muscle weakness (generalized): Secondary | ICD-10-CM | POA: Diagnosis not present

## 2024-03-14 DIAGNOSIS — Z4789 Encounter for other orthopedic aftercare: Secondary | ICD-10-CM | POA: Diagnosis not present

## 2024-03-21 DIAGNOSIS — Z4789 Encounter for other orthopedic aftercare: Secondary | ICD-10-CM | POA: Diagnosis not present

## 2024-03-21 DIAGNOSIS — M6281 Muscle weakness (generalized): Secondary | ICD-10-CM | POA: Diagnosis not present

## 2024-03-21 DIAGNOSIS — R262 Difficulty in walking, not elsewhere classified: Secondary | ICD-10-CM | POA: Diagnosis not present

## 2024-03-22 DIAGNOSIS — Z6841 Body Mass Index (BMI) 40.0 and over, adult: Secondary | ICD-10-CM | POA: Diagnosis not present

## 2024-03-22 DIAGNOSIS — M5416 Radiculopathy, lumbar region: Secondary | ICD-10-CM | POA: Diagnosis not present

## 2024-03-28 DIAGNOSIS — M6281 Muscle weakness (generalized): Secondary | ICD-10-CM | POA: Diagnosis not present

## 2024-03-28 DIAGNOSIS — R262 Difficulty in walking, not elsewhere classified: Secondary | ICD-10-CM | POA: Diagnosis not present

## 2024-03-28 DIAGNOSIS — Z4789 Encounter for other orthopedic aftercare: Secondary | ICD-10-CM | POA: Diagnosis not present

## 2024-04-03 ENCOUNTER — Other Ambulatory Visit: Payer: Self-pay | Admitting: Student

## 2024-04-03 DIAGNOSIS — I10 Essential (primary) hypertension: Secondary | ICD-10-CM

## 2024-04-11 NOTE — Progress Notes (Signed)
 Remote pacemaker transmission.

## 2024-04-11 NOTE — Addendum Note (Signed)
 Addended by: VICCI SELLER A on: 04/11/2024 10:55 AM   Modules accepted: Orders

## 2024-04-17 DIAGNOSIS — H353131 Nonexudative age-related macular degeneration, bilateral, early dry stage: Secondary | ICD-10-CM | POA: Diagnosis not present

## 2024-04-17 DIAGNOSIS — H04123 Dry eye syndrome of bilateral lacrimal glands: Secondary | ICD-10-CM | POA: Diagnosis not present

## 2024-04-17 DIAGNOSIS — H40013 Open angle with borderline findings, low risk, bilateral: Secondary | ICD-10-CM | POA: Diagnosis not present

## 2024-04-17 DIAGNOSIS — H26493 Other secondary cataract, bilateral: Secondary | ICD-10-CM | POA: Diagnosis not present

## 2024-05-04 ENCOUNTER — Encounter: Payer: Self-pay | Admitting: Cardiology

## 2024-05-08 ENCOUNTER — Other Ambulatory Visit: Payer: Self-pay | Admitting: Family Medicine

## 2024-05-09 ENCOUNTER — Ambulatory Visit: Payer: Medicare Other

## 2024-05-09 DIAGNOSIS — I4892 Unspecified atrial flutter: Secondary | ICD-10-CM

## 2024-05-10 LAB — CUP PACEART REMOTE DEVICE CHECK
Battery Remaining Longevity: 113 mo
Battery Remaining Percentage: 85 %
Battery Voltage: 3.02 V
Brady Statistic RV Percent Paced: 99 %
Date Time Interrogation Session: 20250909020015
Implantable Lead Connection Status: 753985
Implantable Lead Implant Date: 20230609
Implantable Lead Location: 753860
Implantable Pulse Generator Implant Date: 20230609
Lead Channel Impedance Value: 510 Ohm
Lead Channel Pacing Threshold Amplitude: 0.625 V
Lead Channel Pacing Threshold Pulse Width: 0.5 ms
Lead Channel Sensing Intrinsic Amplitude: 12 mV
Lead Channel Setting Pacing Amplitude: 0.875
Lead Channel Setting Pacing Pulse Width: 0.5 ms
Lead Channel Setting Sensing Sensitivity: 4 mV
Pulse Gen Model: 1272
Pulse Gen Serial Number: 8068284

## 2024-05-14 ENCOUNTER — Ambulatory Visit: Payer: Self-pay | Admitting: Cardiology

## 2024-05-15 DIAGNOSIS — H26492 Other secondary cataract, left eye: Secondary | ICD-10-CM | POA: Diagnosis not present

## 2024-05-18 ENCOUNTER — Other Ambulatory Visit: Payer: Self-pay | Admitting: Family Medicine

## 2024-05-18 NOTE — Progress Notes (Signed)
 Remote PPM Transmission

## 2024-05-19 ENCOUNTER — Other Ambulatory Visit: Payer: Self-pay | Admitting: Physician Assistant

## 2024-05-23 ENCOUNTER — Other Ambulatory Visit (HOSPITAL_BASED_OUTPATIENT_CLINIC_OR_DEPARTMENT_OTHER): Payer: Self-pay

## 2024-05-23 DIAGNOSIS — Z23 Encounter for immunization: Secondary | ICD-10-CM | POA: Diagnosis not present

## 2024-05-23 MED ORDER — FLUZONE HIGH-DOSE 0.5 ML IM SUSY
0.5000 mL | PREFILLED_SYRINGE | Freq: Once | INTRAMUSCULAR | 0 refills | Status: AC
  Filled 2024-05-23: qty 0.5, 1d supply, fill #0

## 2024-05-23 MED ORDER — COMIRNATY 30 MCG/0.3ML IM SUSY
0.3000 mL | PREFILLED_SYRINGE | Freq: Once | INTRAMUSCULAR | 0 refills | Status: AC
  Filled 2024-05-23: qty 0.3, 1d supply, fill #0

## 2024-06-07 NOTE — Progress Notes (Unsigned)
 Cardiology Office Note:  .   Date:  06/07/2024  ID:  Tracy Miles, DOB September 10, 1933, MRN [id number] PCP: Domenica Harlene LABOR, MD  [facility] Providers Cardiologist:  None Cardiology APP:  Kelsie Willey NOVAK, NP  Electrophysiologist:  OLE ONEIDA HOLTS, MD {  History of Present Illness: .   Tracy Miles is a 89 y.o. female w/PMHx of  HTN, HLD, DVT hx, OSA, morbid obesity permanent Afib,  CHB s/p AV node ablation, PPM,  chronic CHF (diastolic)  She saw Dr. HOLTS 05/2022, doing well, improved exertional capacity s/p AVnode ablation and pacing, metoprolol  stopped, planned for an annual visit  I saw her 05/12/23 She has had a difficult year, her youngest son died suddenly and unexpectedly last Dec. She has fairly significant spinal stenosis and arthritis pain that takes a toll on her. Generally feels fatigued, but markedly improved post AV node ablation at least. No CP, no palpitations or cardiac awareness No SOB No near syncope or syncope. No bleeding or signs of bleeding  She goes to the pool 3d/week, walks back/forth in the pool for exercise and feels well doing it She is very good with her medicines Sees her PMD regularly about Q63mo, has labs done there   Today's visit is scheduled as an annual visit ROS:   She struggles with terrible back pain, had surgery back in April > has been a long 6 mo. No CP, palpitations, SOB No near syncope or syncope No bleeding or signs of bleeding She inquires about reducing the number of medications she takes  Device information Abbott single chamber PPM implanted 02/06/22  There has been discussion of position change of the generator, stable lead position/function  Arrhythmia/AAD hx AF is described as permanent S/p AV node ablation  Studies Reviewed: SABRA    EKG done today and reviewed by myself:  AFib , V paced 70bpm (QRS )  DEVICE interrogation done today and reviewed by myself *** Battery and lead measurements are  good *** No HVR episodes Dependent at 40    01/12/2022  Echo: IMPRESSIONS   1. Atrial fibrillation      . Left ventricular ejection fraction, by estimation, is 45 to 50%. The  left ventricle has mildly decreased function. Left ventricular endocardial  border not optimally defined to evaluate regional wall motion. There is  mild concentric left ventricular   hypertrophy. Left ventricular diastolic parameters are indeterminate.   2. Right ventricular systolic function is normal. The right ventricular  size is normal. There is normal pulmonary artery systolic pressure.   3. Left atrial size was moderately dilated.   4. The mitral valve is degenerative. Moderate mitral valve regurgitation.  No evidence of mitral stenosis. Moderate mitral annular calcification.   5. The aortic valve has an indeterminant number of cusps. Aortic valve  regurgitation is not visualized. Aortic valve sclerosis is present, with  no evidence of aortic valve stenosis.   6. The inferior vena cava is normal in size with greater than 50%  respiratory variability, suggesting right atrial pressure of 3 mmHg.     Risk Assessment/Calculations:    Physical Exam:   VS:  There were no vitals taken for this visit.   Wt Readings from Last 3 Encounters:  02/04/24 276 lb 6.4 oz (125.4 kg)  01/20/24 275 lb 6.4 oz (124.9 kg)  01/04/24 273 lb 9.6 oz (124.1 kg)    GEN: Well nourished, well developed in no acute distress NECK: No JVD; No carotid bruits CARDIAC: RRR (paced),  no murmurs, rubs, gallops RESPIRATORY: CTA b/l without rales, wheezing or rhonchi  ABDOMEN: Soft, non-tender, non-distended EXTREMITIES:trace b/l LE edema; No deformity   PPM site: is stable, no thinning, fluctuation, tethering  ASSESSMENT AND PLAN: .    permanent AFib CHA2DS2Vasc is 7, on Eliquis , appropriately dosed S/p AV node ablation Dependent/rate contolled  PPM intact function no programing changes made  Chronic CHF  (diastolic) Appears well compensated  HTN Looks good She f/u this with her PMD She will review meds at her upcoming visit  Secondary hypercoagulable state 2/2 AFib    We discussed Dr. Hiram upcoming departure/relocation.  She would like to f/u with Dr. Kennyth (likes the [facility] connection!)   Dispo: remotes as usual, back in clinic with EP again in a year, sooner if needed  Signed, Charlies Macario Arthur, PA-C

## 2024-06-08 ENCOUNTER — Ambulatory Visit: Attending: Physician Assistant | Admitting: Physician Assistant

## 2024-06-08 ENCOUNTER — Encounter: Payer: Self-pay | Admitting: Physician Assistant

## 2024-06-08 VITALS — BP 146/82 | HR 70 | Ht 63.0 in | Wt 277.1 lb

## 2024-06-08 DIAGNOSIS — I421 Obstructive hypertrophic cardiomyopathy: Secondary | ICD-10-CM | POA: Diagnosis not present

## 2024-06-08 DIAGNOSIS — I38 Endocarditis, valve unspecified: Secondary | ICD-10-CM | POA: Diagnosis not present

## 2024-06-08 DIAGNOSIS — D6869 Other thrombophilia: Secondary | ICD-10-CM | POA: Diagnosis not present

## 2024-06-08 DIAGNOSIS — I10 Essential (primary) hypertension: Secondary | ICD-10-CM | POA: Insufficient documentation

## 2024-06-08 DIAGNOSIS — I4821 Permanent atrial fibrillation: Secondary | ICD-10-CM | POA: Diagnosis not present

## 2024-06-08 DIAGNOSIS — I5032 Chronic diastolic (congestive) heart failure: Secondary | ICD-10-CM | POA: Insufficient documentation

## 2024-06-08 DIAGNOSIS — Z950 Presence of cardiac pacemaker: Secondary | ICD-10-CM | POA: Insufficient documentation

## 2024-06-08 LAB — CUP PACEART INCLINIC DEVICE CHECK
Battery Remaining Longevity: 108 mo
Battery Voltage: 3.02 V
Brady Statistic RV Percent Paced: 99.95 %
Date Time Interrogation Session: 20251009171150
Implantable Lead Connection Status: 753985
Implantable Lead Implant Date: 20230609
Implantable Lead Location: 753860
Implantable Pulse Generator Implant Date: 20230609
Lead Channel Impedance Value: 512.5 Ohm
Lead Channel Pacing Threshold Amplitude: 0.625 V
Lead Channel Pacing Threshold Pulse Width: 0.5 ms
Lead Channel Sensing Intrinsic Amplitude: 12 mV
Lead Channel Setting Pacing Amplitude: 0.875
Lead Channel Setting Pacing Pulse Width: 0.5 ms
Lead Channel Setting Sensing Sensitivity: 4 mV
Pulse Gen Model: 1272
Pulse Gen Serial Number: 8068284

## 2024-06-08 NOTE — Patient Instructions (Signed)
 Medication Instructions:  Your physician recommends that you continue on your current medications as directed. Please refer to the Current Medication list given to you today.  *If you need a refill on your cardiac medications before your next appointment, please call your pharmacy*  Lab Work: None ordered If you have labs (blood work) drawn today and your tests are completely normal, you will receive your results only by: MyChart Message (if you have MyChart) OR A paper copy in the mail If you have any lab test that is abnormal or we need to change your treatment, we will call you to review the results.  Follow-Up: At [facility], you and your health needs are our priority.  As part of our continuing mission to provide you with exceptional heart care, our providers are all part of one team.  This team includes your primary Cardiologist (physician) and Advanced Practice Providers or APPs (Physician Assistants and Nurse Practitioners) who all work together to provide you with the care you need, when you need it.  Your next appointment:   1 year(s)  Provider:   Fonda Kitty, MD

## 2024-06-12 ENCOUNTER — Ambulatory Visit: Payer: Self-pay | Admitting: Cardiology

## 2024-06-19 ENCOUNTER — Other Ambulatory Visit: Payer: Self-pay | Admitting: Family Medicine

## 2024-06-30 ENCOUNTER — Ambulatory Visit (INDEPENDENT_AMBULATORY_CARE_PROVIDER_SITE_OTHER): Admitting: *Deleted

## 2024-06-30 VITALS — BP 148/88 | HR 72 | Temp 98.5°F | Resp 18 | Ht 63.0 in | Wt 275.4 lb

## 2024-06-30 DIAGNOSIS — Z0000 Encounter for general adult medical examination without abnormal findings: Secondary | ICD-10-CM | POA: Diagnosis not present

## 2024-06-30 DIAGNOSIS — Z1231 Encounter for screening mammogram for malignant neoplasm of breast: Secondary | ICD-10-CM

## 2024-06-30 NOTE — Patient Instructions (Addendum)
.  Tracy Miles , Thank you for taking time out of your busy schedule to complete your Annual Wellness Visit with me. I enjoyed our conversation and look forward to speaking with you again next year. I, as well as your care team,  appreciate your ongoing commitment to your health goals. Please review the following plan we discussed and let me know if I can assist you in the future. Your Game plan/ To Do List    Referrals: If you haven't heard from the office you've been referred to, please reach out to them at the phone provided.   M[employer] [place]): [phone], anytime after 11/14/24.  Follow up Visits: Next Medicare AWV with our clinical staff:  07/03/25, 3pm, in person.  Next Office Visit with your provider: 07/03/24, 8:40am, Dr Domenica.  Clinician Recommendations:  Aim for 30 minutes of exercise or brisk walking, 6-8 glasses of water, and 5 servings of fruits and vegetables each day.       This is a list of the screening recommended for you and due dates:  Health Maintenance  Topic Date Due   Medicare Annual Wellness Visit  06/29/2024   COVID-19 Vaccine (10 - Moderna risk 2025-26 season) 11/20/2024   DTaP/Tdap/Td vaccine (3 - Td or Tdap) 09/17/2032   Pneumococcal Vaccine for age over 84  Completed   Flu Shot  Completed   DEXA scan (bone density measurement)  Completed   Zoster (Shingles) Vaccine  Completed   Meningitis B Vaccine  Aged Out   Colon Cancer Screening  Discontinued    Advanced directives: (In Chart) A copy of your advanced directives are scanned into your chart should your provider ever need it. Advance Care Planning is important because it:  [x]  Makes sure you receive the medical care that is consistent with your values, goals, and preferences  [x]  It provides guidance to your family and loved ones and reduces their decisional burden about whether or not they are making the right decisions based on your wishes.  Follow the link provided in your after  visit summary or read over the paperwork we have mailed to you to help you started getting your Advance Directives in place. If you need assistance in completing these, please reach out to us  so that we can help you!  See attachments for Preventive Care and Fall Prevention Tips.

## 2024-06-30 NOTE — Progress Notes (Signed)
 Please attest this visit in the absence of patient primary care provider.    Subjective:   Tracy Miles is a 89 y.o. who presents for a Medicare Wellness preventive visit.  As a reminder, Annual Wellness Visits don't include a physical exam, and some assessments may be limited, especially if this visit is performed virtually. We may recommend an in-person follow-up visit with your provider if needed.  Visit Complete: In person  Persons Participating in Visit: Patient.  AWV Questionnaire: Yes: Patient Medicare AWV questionnaire was completed by the patient on 06/23/24; I have confirmed that all information answered by patient is correct and no changes since this date.  Cardiac Risk Factors include: advanced age (>66men, >13 women);hypertension;dyslipidemia;Other (see comment), Risk factor comments: A-fib, OSA, pulmonary fibrosis     Objective:    Today's Vitals   06/30/24 1454 06/30/24 1531  BP: (!) 142/103 (!) 148/88  Pulse: 72   Resp: 18   Temp: 98.5 F (36.9 C)   TempSrc: Oral   SpO2: 98%   Weight: 275 lb 6.4 oz (124.9 kg)   Height: 5' 3 (1.6 m)   PF: (!) 1143 L/min    Body mass index is 48.78 kg/m.     06/30/2024    3:21 PM 12/03/2023    8:06 AM 12/02/2023    2:23 PM 06/30/2023    1:52 PM 06/25/2022    3:40 PM 03/23/2022    2:54 PM 02/08/2022    8:37 AM  Advanced Directives  Does Patient Have a Medical Advance Directive? Yes Yes Yes Yes Yes Yes Yes  Type of Advance Directive Living will Living will;Healthcare Power of Attorney Living will Living will Healthcare Power of [place];Living will Living will   Does patient want to make changes to medical advance directive? No - Patient declined No - Patient declined No - Patient declined No - Patient declined No - Patient declined    Copy of Healthcare Power of Attorney in Chart?  No - copy requested   Yes - validated most recent copy scanned in chart (See row information)      Current Medications  (verified) Outpatient Encounter Medications as of 06/30/2024  Medication Sig   acetaminophen  (TYLENOL ) 500 MG tablet Take 1,000 mg by mouth every 6 (six) hours as needed for moderate pain (pain score 4-6). (Patient taking differently: Take 1,000 mg by mouth every 6 (six) hours as needed for moderate pain (pain score 4-6). Takes 2 every morning, 1 at lunch and 2 at bedtime)   amLODipine  (NORVASC ) 2.5 MG tablet Take 1 tablet (2.5 mg total) by mouth daily.   atorvastatin  (LIPITOR) 20 MG tablet Take 1 tablet (20 mg total) by mouth daily.   bumetanide  (BUMEX ) 1 MG tablet TAKE 1 TABLET (1 MG TOTAL) BY MOUTH DAILY.   Calcium  Carb-Cholecalciferol  (CALCIUM  600 + D PO) Take 1 tablet by mouth 2 (two) times daily.   Carboxymethylcellul-Glycerin (LUBRICATING EYE DROPS OP) Place 1 drop into both eyes in the morning and at bedtime.   ELIQUIS  5 MG TABS tablet TAKE ONE TABLET EVERY MORNING AND AT BEDTIME   lisinopril  (ZESTRIL ) 10 MG tablet Take 1 tablet (10 mg total) by mouth daily.   loratadine  (CLARITIN ) 10 MG tablet Take 10 mg by mouth daily.    Multiple Vitamins-Minerals (ONE-A-DAY WOMENS 50 PLUS PO) Take 1 tablet by mouth daily.   nystatin -triamcinolone  ointment (MYCOLOG) Apply to affected area twice daily as directed.   potassium chloride  (KLOR-CON ) 10 MEQ tablet TAKE 1 TABLET BY MOUTH  IN THE  MORNING AND 2 TABLETS BY MOUTH  IN THE EVENING   Probiotic CAPS Take 1 capsule by mouth daily. Gummy   psyllium (REGULOID) 0.52 g capsule Take 1 capsule by mouth in the morning and at bedtime.   No facility-administered encounter medications on file as of 06/30/2024.    Allergies (verified) Gabapentin, Lactose intolerance (gi), Penicillins, Sulfa antibiotics, Vancomycin , and Zebeta [bisoprolol fumarate]   History: Past Medical History:  Diagnosis Date   Allergy    Anemia    Aortic stenosis    Very mild; no AS on 01/12/22 TTE   Benign paroxysmal positional vertigo 12/17/2013   Chicken pox as a child   Colon  polyp    Coronary artery disease    DDD (degenerative disc disease) 11/16/2013   Dysrhythmia    Hyperlipidemia    Hypertension    Iron deficiency anemia 11/13/2013   Mitral regurgitation    moderate MR 01/12/22 TTE   Mumps child and teenager   OA (osteoarthritis) 11/19/2013   S/p L TKR   Obesity    OSA on CPAP    Pancreatitis    post hysterectomy   Persistent atrial fibrillation (HCC)    Personal history of colonic polyps 02/25/2014   Personal history of DVT (deep vein thrombosis) X 2   left   Presence of permanent cardiac pacemaker    Sleep apnea    Spinal stenosis    Past Surgical History:  Procedure Laterality Date   APPENDECTOMY     ATRIAL FIBRILLATION ABLATION N/A 04/25/2019   Procedure: ATRIAL FIBRILLATION ABLATION;  Surgeon: Kelsie Agent, MD;  Location: [facility];  Service: Cardiovascular;  Laterality: N/A;   AV NODE ABLATION N/A 03/23/2022   Procedure: AV NODE ABLATION;  Surgeon: Cindie Ole DASEN, MD;  Location: [facility];  Service: Cardiovascular;  Laterality: N/A;   CARDIOVERSION N/A 08/09/2015   Procedure: CARDIOVERSION;  Surgeon: Oneil JAYSON Parchment, MD;  Location: [facility] ENDOSCOPY;  Service: Cardiovascular;  Laterality: N/A;   CARDIOVERSION N/A 09/30/2018   Procedure: CARDIOVERSION;  Surgeon: Maranda Leim DEL, MD;  Location: [facility] ENDOSCOPY;  Service: Cardiovascular;  Laterality: N/A;   CARDIOVERSION N/A 02/27/2019   Procedure: CARDIOVERSION;  Surgeon: Lonni Slain, MD;  Location: [facility] ENDOSCOPY;  Service: Cardiovascular;  Laterality: N/A;   CARDIOVERSION N/A 06/07/2019   Procedure: CARDIOVERSION;  Surgeon: Maranda Leim DEL, MD;  Location: [facility] ENDOSCOPY;  Service: Cardiovascular;  Laterality: N/A;   CATARACT EXTRACTION W/ INTRAOCULAR LENS  IMPLANT, BILATERAL Bilateral 2016   CYST REMOVAL HAND Bilateral 1990's   played to much golf   DILATION AND CURETTAGE OF UTERUS     EYE SURGERY     implantable loop recorder placement  11/20/2019    Medtronic Reveal Linq model LNQ 22 (model number [id number]) implanted in office by Dr Kelsie for afib management   JOINT REPLACEMENT Left 2013   knee   LUMBAR LAMINECTOMY  40 yrs ago   L3-4   LUMBAR LAMINECTOMY/DECOMPRESSION MICRODISCECTOMY Left 12/03/2023   Procedure: LEFT LUMBAR THREE-FOUR LUMBAR LAMINECTOMY/DECOMPRESSION MICRODISCECTOMY;  Surgeon: Louis Shove, MD;  Location: [facility] OR;  Service: Neurosurgery;  Laterality: Left;  Microdiscectomy - left - L3-L4   MENISCUS REPAIR Bilateral 2000 and 2010   PACEMAKER IMPLANT N/A 02/06/2022   Procedure: PACEMAKER IMPLANT;  Surgeon: Cindie Ole DASEN, MD;  Location: [facility];  Service: Cardiovascular;  Laterality: N/A;   PACEMAKER INSERTION     REPLACEMENT TOTAL KNEE Left 2013   SPINE SURGERY  TEE WITHOUT CARDIOVERSION N/A 04/24/2019   Procedure: TRANSESOPHAGEAL ECHOCARDIOGRAM (TEE);  Surgeon: Alveta Aleene PARAS, MD;  Location: [facility] ENDOSCOPY;  Service: Cardiovascular;  Laterality: N/A;   TONSILECTOMY, ADENOIDECTOMY, BILATERAL MYRINGOTOMY AND TUBES  child   TOTAL ABDOMINAL HYSTERECTOMY  1995   had 2 tumors- benign   TUBAL LIGATION  89 years old   YAG LASER APPLICATION Bilateral    1st- 04/17/24, 2nd- 05/15/24   Family History  Problem Relation Age of Onset   Heart attack Mother 68   Hyperlipidemia Mother        ?   Dementia Mother    Pernicious anemia Mother    Heart disease Mother    COPD Father        smoker   Cancer Father 5       prostate   Heart disease Brother        quadruple bipass surgery   Hyperlipidemia Brother    Hypertension Brother    Diabetes Maternal Grandmother        type 2   Pernicious anemia Maternal Grandmother    Gout Son    Atrial fibrillation Son    Hyperlipidemia Son    Cancer Maternal Grandfather        liver   Cancer Paternal Grandmother        lung- doesn't think she smokes?   Stroke Paternal Grandfather    Cancer Son 31       non hodgin's lymphoma   Gout Son    Hyperlipidemia Son     Sleep apnea Son    Heart disease Son    Colon cancer Neg Hx    Pancreatic cancer Neg Hx    Stomach cancer Neg Hx    Throat cancer Neg Hx    Liver disease Neg Hx    Social History   Socioeconomic History   Marital status: Widowed    Spouse name: Not on file   Number of children: 3   Years of education: Not on file   Highest education level: Bachelor's degree (e.g., BA, AB, BS)  Occupational History   Occupation: retired runner, broadcasting/film/video  Tobacco Use   Smoking status: Never   Smokeless tobacco: Never   Tobacco comments:    never used tobacco  Vaping Use   Vaping status: Never Used  Substance and Sexual Activity   Alcohol  use: No   Drug use: No   Sexual activity: Never    Comment: lives at [place], low sodium diet  Other Topics Concern   Not on file  Social History Narrative   her youngest son died suddenly and unexpectedly Dec 2023- MI   Social Drivers of Corporate Investment Banker Strain: Low Risk  (06/23/2024)   Overall Financial Resource Strain (CARDIA)    Difficulty of Paying Living Expenses: Not hard at all  Food Insecurity: No Food Insecurity (06/23/2024)   Hunger Vital Sign    Worried About Running Out of Food in the Last Year: Never true    Ran Out of Food in the Last Year: Never true  Transportation Needs: No Transportation Needs (06/30/2024)   PRAPARE - Administrator, Civil Service (Medical): No    Lack of Transportation (Non-Medical): No  Physical Activity: Sufficiently Active (06/23/2024)   Exercise Vital Sign    Days of Exercise per Week: 3 days    Minutes of Exercise per Session: 60 min  Stress: No Stress Concern Present (06/23/2024)   [employer] of Occupational Health -  Occupational Stress Questionnaire    Feeling of Stress: Only a little  Social Connections: Moderately Integrated (06/23/2024)   Social Connection and Isolation Panel    Frequency of Communication with Friends and Family: More than three times a week     Frequency of Social Gatherings with Friends and Family: More than three times a week    Attends Religious Services: More than 4 times per year    Active Member of [employer] or Organizations: Yes    Attends Banker Meetings: More than 4 times per year    Marital Status: Widowed    Tobacco Counseling Counseling given: Not Answered Tobacco comments: never used tobacco    Clinical Intake:  Pre-visit preparation completed: Yes        BMI - recorded: 48.78 Nutritional Status: BMI > 30  Obese Nutritional Risks: None Diabetes: No  Lab Results  Component Value Date   HGBA1C 5.9 (H) 02/04/2024   HGBA1C 6.1 08/17/2023   HGBA1C 6.0 04/05/2023     How often do you need to have someone help you when you read instructions, pamphlets, or other written materials from your doctor or pharmacy?: 1 - Never What is the last grade level you completed in school?: Bachelor's degree  Interpreter Needed?: No  Information entered by :: Lolita Maree MARINER)   Activities of Daily Living     06/23/2024    9:36 AM 12/03/2023    3:00 PM  In your present state of health, do you have any difficulty performing the following activities:  Hearing? 0   Vision? 0   Difficulty concentrating or making decisions? 0   Walking or climbing stairs? 1   Dressing or bathing? 0   Doing errands, shopping? 0 0  Preparing Food and eating ? N   Using the Toilet? N   In the past six months, have you accidently leaked urine? N   Do you have problems with loss of bowel control? N   Managing your Medications? N   Managing your Finances? N   Housekeeping or managing your Housekeeping? Y   Comment lives at [facility] and gets assistance with this     Patient Care Team: Domenica Harlene LABOR, MD as PCP - General (Family Medicine) Kelsie Willey NOVAK, NP as Nurse Practitioner (Cardiology) Jude Harden GAILS, MD as Consulting Physician (Pulmonary Disease) Dalldorf, Peter, MD as Consulting Physician  (Orthopedic Surgery) Nicholaus Sherlean CROME, [facility] (Inactive) (Pharmacist) Kennyth Chew, MD as Consulting Physician (Cardiology)  I have updated your Care Teams any recent Medical Services you may have received from other providers in the past year.     Assessment:   This is a routine wellness examination for [place].  Hearing/Vision screen Hearing Screening - Comments:: Notices that she isn't hearing certain tones as well. Vision Screening - Comments:: Up to date with routine eye exams with [facility]   Goals Addressed   None    Depression Screen     06/30/2024    3:15 PM 01/04/2024    2:36 PM 06/30/2023    1:55 PM 04/05/2023    1:14 PM 03/16/2023   11:19 AM 12/01/2022   11:00 AM 09/08/2022    2:10 PM  PHQ 2/9 Scores  PHQ - 2 Score 1 0 0 0 0 2 0  PHQ- 9 Score 3   0 0 3     Fall Risk     06/23/2024    9:36 AM 01/20/2024    1:45 PM 01/04/2024    2:35  PM 06/23/2023   10:24 AM 04/05/2023    1:13 PM  Fall Risk   Falls in the past year? 0 0 0 0 0  Number falls in past yr: 0  0 0 0  Injury with Fall? 0  0 0 0  Risk for fall due to : Orthopedic patient;Impaired mobility   No Fall Risks   Follow up Education provided   Falls evaluation completed Falls evaluation completed    MEDICARE RISK AT HOME:  Medicare Risk at Home Any stairs in or around the home?: (Patient-Rptd) No Home free of loose throw rugs in walkways, pet beds, electrical cords, etc?: (Patient-Rptd) Yes Adequate lighting in your home to reduce risk of falls?: (Patient-Rptd) Yes Life alert?: (Patient-Rptd) No Use of a cane, walker or w/c?: (Patient-Rptd) Yes Grab bars in the bathroom?: (Patient-Rptd) Yes Shower chair or bench in shower?: (Patient-Rptd) Yes Elevated toilet seat or a handicapped toilet?: (Patient-Rptd) Yes  TIMED UP AND GO:  Was the test performed?  Yes  Length of time to ambulate 10 feet: 10 sec Gait slow and steady with assistive device  Cognitive Function: 6CIT completed    04/21/2018    1:31 PM  09/29/2016    2:15 PM  MMSE - Mini Mental State Exam  Orientation to time 5 5   Orientation to Place 5 5   Registration 3 3   Attention/ Calculation 5 5   Recall 2 3   Language- name 2 objects 2 2   Language- repeat 1 1  Language- follow 3 step command 3 3   Language- read & follow direction 1 1   Write a sentence 1 1   Copy design 1 1   Total score 29 30      Data saved with a previous flowsheet row definition        06/30/2024    3:22 PM 06/30/2023    2:00 PM 06/25/2022    4:00 PM  6CIT Screen  What Year? 0 points 0 points 0 points  What month? 0 points 0 points 0 points  What time? 0 points 0 points 0 points  Count back from 20 2 points 0 points 2 points  Months in reverse 0 points 0 points 0 points  Repeat phrase 0 points 0 points 2 points  Total Score 2 points 0 points 4 points    Immunizations Immunization History  Administered Date(s) Administered   Fluad  Quad(high Dose 65+) 06/16/2019, 05/19/2021, 05/22/2022   Fluad  Trivalent(High Dose 65+) 05/18/2023   INFLUENZA, HIGH DOSE SEASONAL PF 05/11/2017, 05/24/2018, 05/23/2024   Influenza,inj,Quad PF,6+ Mos 04/30/2016   Influenza-Unspecified 05/31/2013, 05/01/2014, 06/11/2015   Moderna Covid-19 Vaccine  Bivalent Booster 92yrs & up 05/26/2021, 01/20/2022   Moderna Sars-Covid-2 Vaccination 09/13/2019, 10/10/2019, 07/03/2020, 12/18/2020   Pfizer Covid-19 Vaccine Bivalent Booster 41yrs & up 06/12/2021   Pfizer(Comirnaty )Fall Seasonal Vaccine 12 years and older 07/14/2022, 05/18/2023, 05/23/2024   Pneumococcal Conjugate-13 04/02/2014   Pneumococcal Polysaccharide-23 08/31/1998, 09/06/2015   Respiratory Syncytial Virus Vaccine ,Recomb Aduvanted(Arexvy ) 05/18/2022   Tdap 07/31/2012, 09/17/2022   Zoster Recombinant(Shingrix) 04/19/2018, 06/27/2018   Zoster, Live 10/09/2015    Screening Tests Health Maintenance  Topic Date Due   Medicare Annual Wellness (AWV)  06/29/2024   COVID-19 Vaccine (10 - Moderna risk 2025-26  season) 11/20/2024   DTaP/Tdap/Td (3 - Td or Tdap) 09/17/2032   Pneumococcal Vaccine: 50+ Years  Completed   Influenza Vaccine  Completed   DEXA SCAN  Completed   Zoster Vaccines- Shingrix  Completed  Meningococcal B Vaccine  Aged Out   Colonoscopy  Discontinued    Health Maintenance Items Addressed: Mammogram ordered. All other HM up to date  Additional Screening:  Vision Screening: Recommended annual ophthalmology exams for early detection of glaucoma and other disorders of the eye. Is the patient up to date with their annual eye exam?  Yes  Who is the provider or what is the name of the office in which the patient attends annual eye exams? [facility]  Dental Screening: Recommended annual dental exams for proper oral hygiene  Community Resource Referral / Chronic Care Management: CRR required this visit?  No   CCM required this visit?  No   Plan:    I have personally reviewed and noted the following in the patient's chart:   Medical and social history Use of alcohol , tobacco or illicit drugs  Current medications and supplements including opioid prescriptions. Patient is not currently taking opioid prescriptions. Functional ability and status Nutritional status Physical activity Advanced directives List of other physicians Hospitalizations, surgeries, and ER visits in previous 12 months Vitals Screenings to include cognitive, depression, and falls Referrals and appointments  In addition, I have reviewed and discussed with patient certain preventive protocols, quality metrics, and best practice recommendations. A written personalized care plan for preventive services as well as general preventive health recommendations were provided to patient.   Lolita Libra, CMA   06/30/2024   After Visit Summary: (In Person-Printed) AVS printed and given to the patient  Notes: Nothing significant to report at this time.

## 2024-07-02 NOTE — Assessment & Plan Note (Signed)
 Tolerating statin, encouraged heart healthy diet, avoid trans fats, minimize simple carbs and saturated fats. Increase exercise as tolerated

## 2024-07-02 NOTE — Progress Notes (Unsigned)
 Subjective:    Patient ID: Tracy Miles, female    DOB: 1934/01/13, 89 y.o.   MRN: [id number]  No chief complaint on file.   HPI Discussed the use of AI scribe software for clinical note transcription with the patient, who gave verbal consent to proceed.  History of Present Illness     Past Medical History:  Diagnosis Date  . Allergy   . Anemia   . Aortic stenosis    Very mild; no AS on 01/12/22 TTE  . Benign paroxysmal positional vertigo 12/17/2013  . Chicken pox as a child  . Colon polyp   . Coronary artery disease   . DDD (degenerative disc disease) 11/16/2013  . Dysrhythmia   . Hyperlipidemia   . Hypertension   . Iron deficiency anemia 11/13/2013  . Mitral regurgitation    moderate MR 01/12/22 TTE  . Mumps child and teenager  . OA (osteoarthritis) 11/19/2013   S/p L TKR  . Obesity   . OSA on CPAP   . Pancreatitis    post hysterectomy  . Persistent atrial fibrillation (HCC)   . Personal history of colonic polyps 02/25/2014  . Personal history of DVT (deep vein thrombosis) X 2   left  . Presence of permanent cardiac pacemaker   . Sleep apnea   . Spinal stenosis     Past Surgical History:  Procedure Laterality Date  . APPENDECTOMY    . ATRIAL FIBRILLATION ABLATION N/A 04/25/2019   Procedure: ATRIAL FIBRILLATION ABLATION;  Surgeon: Kelsie Agent, MD;  Location: [facility] INVASIVE CV LAB;  Service: Cardiovascular;  Laterality: N/A;  . AV NODE ABLATION N/A 03/23/2022   Procedure: AV NODE ABLATION;  Surgeon: Cindie Ole DASEN, MD;  Location: [facility] INVASIVE CV LAB;  Service: Cardiovascular;  Laterality: N/A;  . CARDIOVERSION N/A 08/09/2015   Procedure: CARDIOVERSION;  Surgeon: Oneil JAYSON Parchment, MD;  Location: [facility] ENDOSCOPY;  Service: Cardiovascular;  Laterality: N/A;  . CARDIOVERSION N/A 09/30/2018   Procedure: CARDIOVERSION;  Surgeon: Maranda Leim DEL, MD;  Location: [facility] ENDOSCOPY;  Service: Cardiovascular;  Laterality: N/A;  . CARDIOVERSION N/A 02/27/2019    Procedure: CARDIOVERSION;  Surgeon: Lonni Slain, MD;  Location: [facility] ENDOSCOPY;  Service: Cardiovascular;  Laterality: N/A;  . CARDIOVERSION N/A 06/07/2019   Procedure: CARDIOVERSION;  Surgeon: Maranda Leim DEL, MD;  Location: [facility] ENDOSCOPY;  Service: Cardiovascular;  Laterality: N/A;  . CATARACT EXTRACTION W/ INTRAOCULAR LENS  IMPLANT, BILATERAL Bilateral 2016  . CYST REMOVAL HAND Bilateral 1990's   played to much golf  . DILATION AND CURETTAGE OF UTERUS    . EYE SURGERY    . implantable loop recorder placement  11/20/2019   Medtronic Reveal Linq model LNQ 22 (model number [id number]) implanted in office by Dr Kelsie for afib management  . JOINT REPLACEMENT Left 2013   knee  . LUMBAR LAMINECTOMY  40 yrs ago   L3-4  . LUMBAR LAMINECTOMY/DECOMPRESSION MICRODISCECTOMY Left 12/03/2023   Procedure: LEFT LUMBAR THREE-FOUR LUMBAR LAMINECTOMY/DECOMPRESSION MICRODISCECTOMY;  Surgeon: Louis Shove, MD;  Location: [facility] OR;  Service: Neurosurgery;  Laterality: Left;  Microdiscectomy - left - L3-L4  . MENISCUS REPAIR Bilateral 2000 and 2010  . PACEMAKER IMPLANT N/A 02/06/2022   Procedure: PACEMAKER IMPLANT;  Surgeon: Cindie Ole DASEN, MD;  Location: [facility] INVASIVE CV LAB;  Service: Cardiovascular;  Laterality: N/A;  . PACEMAKER INSERTION    . REPLACEMENT TOTAL KNEE Left 2013  . SPINE SURGERY    . TEE WITHOUT CARDIOVERSION N/A 04/24/2019   Procedure: TRANSESOPHAGEAL ECHOCARDIOGRAM (TEE);  Surgeon: Alveta Aleene PARAS, MD;  Location: [facility] ENDOSCOPY;  Service: Cardiovascular;  Laterality: N/A;  . TONSILECTOMY, ADENOIDECTOMY, BILATERAL MYRINGOTOMY AND TUBES  child  . TOTAL ABDOMINAL HYSTERECTOMY  1995   had 2 tumors- benign  . TUBAL LIGATION  89 years old  . YAG LASER APPLICATION Bilateral    1st- 04/17/24, 2nd- 05/15/24    Family History  Problem Relation Age of Onset  . Heart attack Mother 62  . Hyperlipidemia Mother        ?  SABRA Dementia Mother   . Pernicious anemia Mother   . Heart  disease Mother   . COPD Father        smoker  . Cancer Father 52       prostate  . Heart disease Brother        quadruple bipass surgery  . Hyperlipidemia Brother   . Hypertension Brother   . Diabetes Maternal Grandmother        type 2  . Pernicious anemia Maternal Grandmother   . Gout Son   . Atrial fibrillation Son   . Hyperlipidemia Son   . Cancer Maternal Grandfather        liver  . Cancer Paternal Grandmother        lung- doesn't think she smokes?  . Stroke Paternal Grandfather   . Cancer Son 50       non hodgin's lymphoma  . Gout Son   . Hyperlipidemia Son   . Sleep apnea Son   . Heart disease Son   . Colon cancer Neg Hx   . Pancreatic cancer Neg Hx   . Stomach cancer Neg Hx   . Throat cancer Neg Hx   . Liver disease Neg Hx     Social History   Socioeconomic History  . Marital status: Widowed    Spouse name: Not on file  . Number of children: 3  . Years of education: Not on file  . Highest education level: Bachelor's degree (e.g., BA, AB, BS)  Occupational History  . Occupation: retired runner, broadcasting/film/video  Tobacco Use  . Smoking status: Never  . Smokeless tobacco: Never  . Tobacco comments:    never used tobacco  Vaping Use  . Vaping status: Never Used  Substance and Sexual Activity  . Alcohol  use: No  . Drug use: No  . Sexual activity: Never    Comment: lives at [place], low sodium diet  Other Topics Concern  . Not on file  Social History Narrative   her youngest son died suddenly and unexpectedly Dec 2023- MI   Social Drivers of Health   Financial Resource Strain: Low Risk  (06/23/2024)   Overall Financial Resource Strain (CARDIA)   . Difficulty of Paying Living Expenses: Not hard at all  Food Insecurity: No Food Insecurity (06/23/2024)   Hunger Vital Sign   . Worried About Programme Researcher, Broadcasting/film/video in the Last Year: Never true   . Ran Out of Food in the Last Year: Never true  Transportation Needs: No Transportation Needs (06/30/2024)   PRAPARE -  Transportation   . Lack of Transportation (Medical): No   . Lack of Transportation (Non-Medical): No  Physical Activity: Sufficiently Active (06/23/2024)   Exercise Vital Sign   . Days of Exercise per Week: 3 days   . Minutes of Exercise per Session: 60 min  Stress: No Stress Concern Present (06/23/2024)   [employer] of Occupational Health - Occupational Stress Questionnaire   . Feeling of Stress:  Only a little  Social Connections: Moderately Integrated (06/23/2024)   Social Connection and Isolation Panel   . Frequency of Communication with Friends and Family: More than three times a week   . Frequency of Social Gatherings with Friends and Family: More than three times a week   . Attends Religious Services: More than 4 times per year   . Active Member of Clubs or Organizations: Yes   . Attends Banker Meetings: More than 4 times per year   . Marital Status: Widowed  Intimate Partner Violence: Not At Risk (06/30/2024)   Humiliation, Afraid, Rape, and Kick questionnaire   . Fear of Current or Ex-Partner: No   . Emotionally Abused: No   . Physically Abused: No   . Sexually Abused: No    Outpatient Medications Prior to Visit  Medication Sig Dispense Refill  . acetaminophen  (TYLENOL ) 500 MG tablet Take 1,000 mg by mouth every 6 (six) hours as needed for moderate pain (pain score 4-6). (Patient taking differently: Take 1,000 mg by mouth every 6 (six) hours as needed for moderate pain (pain score 4-6). Takes 2 every morning, 1 at lunch and 2 at bedtime)    . amLODipine  (NORVASC ) 2.5 MG tablet Take 1 tablet (2.5 mg total) by mouth daily. 90 tablet 1  . atorvastatin  (LIPITOR) 20 MG tablet Take 1 tablet (20 mg total) by mouth daily. 90 tablet 1  . bumetanide  (BUMEX ) 1 MG tablet TAKE 1 TABLET (1 MG TOTAL) BY MOUTH DAILY. 90 tablet 0  . Calcium  Carb-Cholecalciferol  (CALCIUM  600 + D PO) Take 1 tablet by mouth 2 (two) times daily.    . Carboxymethylcellul-Glycerin (LUBRICATING  EYE DROPS OP) Place 1 drop into both eyes in the morning and at bedtime.    . ELIQUIS  5 MG TABS tablet TAKE ONE TABLET EVERY MORNING AND AT BEDTIME 180 tablet 1  . lisinopril  (ZESTRIL ) 10 MG tablet Take 1 tablet (10 mg total) by mouth daily. 90 tablet 1  . loratadine  (CLARITIN ) 10 MG tablet Take 10 mg by mouth daily.     . Multiple Vitamins-Minerals (ONE-A-DAY WOMENS 50 PLUS PO) Take 1 tablet by mouth daily.    . nystatin -triamcinolone  ointment (MYCOLOG) Apply to affected area twice daily as directed. 30 g 0  . potassium chloride  (KLOR-CON ) 10 MEQ tablet TAKE 1 TABLET BY MOUTH IN THE  MORNING AND 2 TABLETS BY MOUTH  IN THE EVENING 270 tablet 0  . Probiotic CAPS Take 1 capsule by mouth daily. Gummy    . psyllium (REGULOID) 0.52 g capsule Take 1 capsule by mouth in the morning and at bedtime.     No facility-administered medications prior to visit.    Allergies  Allergen Reactions  . Gabapentin Swelling    Eyes and Face  . Lactose Intolerance (Gi) Other (See Comments)    Bothers her stomach  . Penicillins Hives and Rash    Did it involve swelling of the face/tongue/throat, SOB, or low BP? No Did it involve sudden or severe rash/hives, skin peeling, or any reaction on the inside of your mouth or nose? Yes Did you need to seek medical attention at a hospital or doctor's office? No When did it last happen?       25+ years If all above answers are NO, may proceed with cephalosporin use.  Hives in throat    . Sulfa Antibiotics Nausea Only and Rash  . Vancomycin  Itching  . Zebeta [Bisoprolol Fumarate] Nausea Only    Review of  Systems  Constitutional:  Negative for fever and malaise/fatigue.  HENT:  Negative for congestion.   Eyes:  Negative for blurred vision.  Respiratory:  Negative for shortness of breath.   Cardiovascular:  Negative for chest pain, palpitations and leg swelling.  Gastrointestinal:  Negative for abdominal pain, blood in stool and nausea.  Genitourinary:  Negative  for dysuria and frequency.  Musculoskeletal:  Negative for falls.  Skin:  Negative for rash.  Neurological:  Negative for dizziness, loss of consciousness and headaches.  Endo/Heme/Allergies:  Negative for environmental allergies.  Psychiatric/Behavioral:  Negative for depression. The patient is not nervous/anxious.        Objective:    Physical Exam Constitutional:      General: She is not in acute distress.    Appearance: Normal appearance. She is well-developed. She is not toxic-appearing.  HENT:     Head: Normocephalic and atraumatic.     Right Ear: External ear normal.     Left Ear: External ear normal.     Nose: Nose normal.  Eyes:     General:        Right eye: No discharge.        Left eye: No discharge.     Conjunctiva/sclera: Conjunctivae normal.  Neck:     Thyroid : No thyromegaly.  Cardiovascular:     Rate and Rhythm: Normal rate and regular rhythm.     Heart sounds: Normal heart sounds. No murmur heard. Pulmonary:     Effort: Pulmonary effort is normal. No respiratory distress.     Breath sounds: Normal breath sounds.  Abdominal:     General: Bowel sounds are normal.     Palpations: Abdomen is soft.     Tenderness: There is no abdominal tenderness. There is no guarding.  Musculoskeletal:        General: Normal range of motion.     Cervical back: Neck supple.  Lymphadenopathy:     Cervical: No cervical adenopathy.  Skin:    General: Skin is warm and dry.  Neurological:     Mental Status: She is alert and oriented to person, place, and time.  Psychiatric:        Mood and Affect: Mood normal.        Behavior: Behavior normal.        Thought Content: Thought content normal.        Judgment: Judgment normal.    There were no vitals taken for this visit. Wt Readings from Last 3 Encounters:  06/30/24 275 lb 6.4 oz (124.9 kg)  06/08/24 277 lb 1.6 oz (125.7 kg)  02/04/24 276 lb 6.4 oz (125.4 kg)    Diabetic Foot Exam - Simple   No data filed    Lab  Results  Component Value Date   WBC 5.5 02/04/2024   HGB 12.7 02/04/2024   HCT 38.4 02/04/2024   PLT 249 02/04/2024   GLUCOSE 100 (H) 02/04/2024   CHOL 152 02/04/2024   TRIG 231 (H) 02/04/2024   HDL 47 (L) 02/04/2024   LDLCALC 73 02/04/2024   ALT 10 02/04/2024   AST 16 02/04/2024   NA 141 02/04/2024   K 4.2 02/04/2024   CL 103 02/04/2024   CREATININE 1.15 (H) 02/04/2024   BUN 21 02/04/2024   CO2 27 02/04/2024   TSH 2.21 02/04/2024   INR 1.55 (H) 08/06/2015   HGBA1C 5.9 (H) 02/04/2024    Lab Results  Component Value Date   TSH 2.21 02/04/2024   Lab Results  Component Value Date   WBC 5.5 02/04/2024   HGB 12.7 02/04/2024   HCT 38.4 02/04/2024   MCV 99.2 02/04/2024   PLT 249 02/04/2024   Lab Results  Component Value Date   NA 141 02/04/2024   K 4.2 02/04/2024   CO2 27 02/04/2024   GLUCOSE 100 (H) 02/04/2024   BUN 21 02/04/2024   CREATININE 1.15 (H) 02/04/2024   BILITOT 0.6 02/04/2024   ALKPHOS 92 08/17/2023   AST 16 02/04/2024   ALT 10 02/04/2024   PROT 6.4 02/04/2024   ALBUMIN 4.5 08/17/2023   CALCIUM  9.2 02/04/2024   ANIONGAP 10 12/02/2023   EGFR 46 (L) 02/04/2024   GFR 49.52 (L) 08/17/2023   Lab Results  Component Value Date   CHOL 152 02/04/2024   Lab Results  Component Value Date   HDL 47 (L) 02/04/2024   Lab Results  Component Value Date   LDLCALC 73 02/04/2024   Lab Results  Component Value Date   TRIG 231 (H) 02/04/2024   Lab Results  Component Value Date   CHOLHDL 3.2 02/04/2024   Lab Results  Component Value Date   HGBA1C 5.9 (H) 02/04/2024       Assessment & Plan:  Permanent atrial fibrillation (HCC) Assessment & Plan: Rate controlled, tolerating Eliquis    Chronic diastolic HF (heart failure) (HCC) Assessment & Plan: No recent exacerbation no changes   Hyperglycemia Assessment & Plan: hgba1c acceptable, minimize simple carbs. Increase exercise as tolerated.    Hyperlipidemia, mixed Assessment & Plan: Tolerating  statin, encouraged heart healthy diet, avoid trans fats, minimize simple carbs and saturated fats. Increase exercise as tolerated   Obesity due to excess calories, unspecified class, unspecified whether serious comorbidity present Assessment & Plan: Encouraged DASH or MIND diet, decrease po intake and increase exercise as tolerated. Needs 7-8 hours of sleep nightly. Avoid trans fats, eat small, frequent meals every 4-5 hours with lean proteins, complex carbs and healthy fats. Minimize simple carbs, high fat foods and processed foods.    Pulmonary fibrosis, unspecified (HCC) Assessment & Plan: No new symptoms.     Assessment and Plan Assessment & Plan      Harlene Horton, MD

## 2024-07-02 NOTE — Assessment & Plan Note (Signed)
 Rate controlled, tolerating Eliquis 

## 2024-07-02 NOTE — Assessment & Plan Note (Signed)
 Encouraged DASH or MIND diet, decrease po intake and increase exercise as tolerated. Needs 7-8 hours of sleep nightly. Avoid trans fats, eat small, frequent meals every 4-5 hours with lean proteins, complex carbs and healthy fats. Minimize simple carbs, high fat foods and processed foods

## 2024-07-02 NOTE — Assessment & Plan Note (Signed)
 No new symptoms

## 2024-07-02 NOTE — Assessment & Plan Note (Signed)
 No recent exacerbation no changes

## 2024-07-02 NOTE — Assessment & Plan Note (Signed)
 hgba1c acceptable, minimize simple carbs. Increase exercise as tolerated.

## 2024-07-03 ENCOUNTER — Ambulatory Visit: Payer: Self-pay | Admitting: Family Medicine

## 2024-07-03 ENCOUNTER — Ambulatory Visit: Admitting: Family Medicine

## 2024-07-03 ENCOUNTER — Encounter: Payer: Self-pay | Admitting: Family Medicine

## 2024-07-03 VITALS — BP 130/82 | HR 70 | Temp 97.8°F | Resp 16 | Ht 63.0 in | Wt 276.0 lb

## 2024-07-03 DIAGNOSIS — R739 Hyperglycemia, unspecified: Secondary | ICD-10-CM | POA: Diagnosis not present

## 2024-07-03 DIAGNOSIS — Z780 Asymptomatic menopausal state: Secondary | ICD-10-CM

## 2024-07-03 DIAGNOSIS — Z23 Encounter for immunization: Secondary | ICD-10-CM

## 2024-07-03 DIAGNOSIS — I5032 Chronic diastolic (congestive) heart failure: Secondary | ICD-10-CM

## 2024-07-03 DIAGNOSIS — E782 Mixed hyperlipidemia: Secondary | ICD-10-CM | POA: Diagnosis not present

## 2024-07-03 DIAGNOSIS — J8410 Pulmonary fibrosis, unspecified: Secondary | ICD-10-CM | POA: Diagnosis not present

## 2024-07-03 DIAGNOSIS — I10 Essential (primary) hypertension: Secondary | ICD-10-CM | POA: Diagnosis not present

## 2024-07-03 DIAGNOSIS — E6609 Other obesity due to excess calories: Secondary | ICD-10-CM

## 2024-07-03 DIAGNOSIS — I4821 Permanent atrial fibrillation: Secondary | ICD-10-CM

## 2024-07-03 DIAGNOSIS — E2839 Other primary ovarian failure: Secondary | ICD-10-CM

## 2024-07-03 LAB — LIPID PANEL
Cholesterol: 153 mg/dL (ref 0–200)
HDL: 47.2 mg/dL (ref >39.00)
LDL Cholesterol: 79 mg/dL (ref 0–99)
NonHDL: 105.96
Total CHOL/HDL Ratio: 3
Triglycerides: 137 mg/dL (ref 0.0–149.0)
VLDL: 27.4 mg/dL (ref 0.0–40.0)

## 2024-07-03 LAB — CBC WITH DIFFERENTIAL/PLATELET
Basophils Absolute: 0.1 K/uL (ref 0.0–0.1)
Basophils Relative: 0.8 % (ref 0.0–3.0)
Eosinophils Absolute: 0.1 K/uL (ref 0.0–0.7)
Eosinophils Relative: 2.1 % (ref 0.0–5.0)
HCT: 39.7 % (ref 36.0–46.0)
Hemoglobin: 13.4 g/dL (ref 12.0–15.0)
Lymphocytes Relative: 13.3 % (ref 12.0–46.0)
Lymphs Abs: 0.8 K/uL (ref 0.7–4.0)
MCHC: 33.7 g/dL (ref 30.0–36.0)
MCV: 96.5 fl (ref 78.0–100.0)
Monocytes Absolute: 0.4 K/uL (ref 0.1–1.0)
Monocytes Relative: 6.3 % (ref 3.0–12.0)
Neutro Abs: 4.7 K/uL (ref 1.4–7.7)
Neutrophils Relative %: 77.5 % — ABNORMAL HIGH (ref 43.0–77.0)
Platelets: 244 K/uL (ref 150.0–400.0)
RBC: 4.12 Mil/uL (ref 3.87–5.11)
RDW: 13.1 % (ref 11.5–15.5)
WBC: 6.1 K/uL (ref 4.0–10.5)

## 2024-07-03 LAB — COMPREHENSIVE METABOLIC PANEL WITH GFR
ALT: 9 U/L (ref 0–35)
AST: 15 U/L (ref 0–37)
Albumin: 4.4 g/dL (ref 3.5–5.2)
Alkaline Phosphatase: 79 U/L (ref 39–117)
BUN: 18 mg/dL (ref 6–23)
CO2: 31 meq/L (ref 19–32)
Calcium: 10 mg/dL (ref 8.4–10.5)
Chloride: 100 meq/L (ref 96–112)
Creatinine, Ser: 0.9 mg/dL (ref 0.40–1.20)
GFR: 57.19 mL/min — ABNORMAL LOW (ref >60.00)
Glucose, Bld: 100 mg/dL — ABNORMAL HIGH (ref 70–99)
Potassium: 4.8 meq/L (ref 3.5–5.1)
Sodium: 140 meq/L (ref 135–145)
Total Bilirubin: 0.9 mg/dL (ref 0.2–1.2)
Total Protein: 6.8 g/dL (ref 6.0–8.3)

## 2024-07-03 LAB — HEMOGLOBIN A1C: Hgb A1c MFr Bld: 6 % (ref 4.6–6.5)

## 2024-07-03 LAB — TSH: TSH: 2.17 u[IU]/mL (ref 0.35–5.50)

## 2024-07-03 MED ORDER — AMLODIPINE BESYLATE 2.5 MG PO TABS: 2.5000 mg | ORAL_TABLET | Freq: Every day | ORAL | 1 refills | Status: AC

## 2024-07-03 MED ORDER — BUMETANIDE 1 MG PO TABS: 1.0000 mg | ORAL_TABLET | Freq: Every day | ORAL | 1 refills | Status: AC

## 2024-07-03 NOTE — Assessment & Plan Note (Signed)
 Well controlled, no changes to meds. Encouraged heart healthy diet such as the DASH diet and exercise as tolerated.

## 2024-07-03 NOTE — Patient Instructions (Signed)
 Hypertension, Adult High blood pressure (hypertension) is when the force of blood pumping through the arteries is too strong. The arteries are the blood vessels that carry blood from the heart throughout the body. Hypertension forces the heart to work harder to pump blood and may cause arteries to become narrow or stiff. Untreated or uncontrolled hypertension can lead to a heart attack, heart failure, a stroke, kidney disease, and other problems. A blood pressure reading consists of a higher number over a lower number. Ideally, your blood pressure should be below 120/80. The first ("top") number is called the systolic pressure. It is a measure of the pressure in your arteries as your heart beats. The second ("bottom") number is called the diastolic pressure. It is a measure of the pressure in your arteries as the heart relaxes. What are the causes? The exact cause of this condition is not known. There are some conditions that result in high blood pressure. What increases the risk? Certain factors may make you more likely to develop high blood pressure. Some of these risk factors are under your control, including: Smoking. Not getting enough exercise or physical activity. Being overweight. Having too much fat, sugar, calories, or salt (sodium) in your diet. Drinking too much alcohol. Other risk factors include: Having a personal history of heart disease, diabetes, high cholesterol, or kidney disease. Stress. Having a family history of high blood pressure and high cholesterol. Having obstructive sleep apnea. Age. The risk increases with age. What are the signs or symptoms? High blood pressure may not cause symptoms. Very high blood pressure (hypertensive crisis) may cause: Headache. Fast or irregular heartbeats (palpitations). Shortness of breath. Nosebleed. Nausea and vomiting. Vision changes. Severe chest pain, dizziness, and seizures. How is this diagnosed? This condition is diagnosed by  measuring your blood pressure while you are seated, with your arm resting on a flat surface, your legs uncrossed, and your feet flat on the floor. The cuff of the blood pressure monitor will be placed directly against the skin of your upper arm at the level of your heart. Blood pressure should be measured at least twice using the same arm. Certain conditions can cause a difference in blood pressure between your right and left arms. If you have a high blood pressure reading during one visit or you have normal blood pressure with other risk factors, you may be asked to: Return on a different day to have your blood pressure checked again. Monitor your blood pressure at home for 1 week or longer. If you are diagnosed with hypertension, you may have other blood or imaging tests to help your health care provider understand your overall risk for other conditions. How is this treated? This condition is treated by making healthy lifestyle changes, such as eating healthy foods, exercising more, and reducing your alcohol intake. You may be referred for counseling on a healthy diet and physical activity. Your health care provider may prescribe medicine if lifestyle changes are not enough to get your blood pressure under control and if: Your systolic blood pressure is above 130. Your diastolic blood pressure is above 80. Your personal target blood pressure may vary depending on your medical conditions, your age, and other factors. Follow these instructions at home: Eating and drinking  Eat a diet that is high in fiber and potassium, and low in sodium, added sugar, and fat. An example of this eating plan is called the DASH diet. DASH stands for Dietary Approaches to Stop Hypertension. To eat this way: Eat  plenty of fresh fruits and vegetables. Try to fill one half of your plate at each meal with fruits and vegetables. Eat whole grains, such as whole-wheat pasta, brown rice, or whole-grain bread. Fill about one  fourth of your plate with whole grains. Eat or drink low-fat dairy products, such as skim milk or low-fat yogurt. Avoid fatty cuts of meat, processed or cured meats, and poultry with skin. Fill about one fourth of your plate with lean proteins, such as fish, chicken without skin, beans, eggs, or tofu. Avoid pre-made and processed foods. These tend to be higher in sodium, added sugar, and fat. Reduce your daily sodium intake. Many people with hypertension should eat less than 1,500 mg of sodium a day. Do not drink alcohol if: Your health care provider tells you not to drink. You are pregnant, may be pregnant, or are planning to become pregnant. If you drink alcohol: Limit how much you have to: 0-1 drink a day for women. 0-2 drinks a day for men. Know how much alcohol is in your drink. In the [place] one drink equals one 12 oz bottle of beer (355 mL), one 5 oz glass of wine (148 mL), or one 1 oz glass of hard liquor (44 mL). Lifestyle  Work with your health care provider to maintain a healthy body weight or to lose weight. Ask what an ideal weight is for you. Get at least 30 minutes of exercise that causes your heart to beat faster (aerobic exercise) most days of the week. Activities may include walking, swimming, or biking. Include exercise to strengthen your muscles (resistance exercise), such as Pilates or lifting weights, as part of your weekly exercise routine. Try to do these types of exercises for 30 minutes at least 3 days a week. Do not use any products that contain nicotine or tobacco. These products include cigarettes, chewing tobacco, and vaping devices, such as e-cigarettes. If you need help quitting, ask your health care provider. Monitor your blood pressure at home as told by your health care provider. Keep all follow-up visits. This is important. Medicines Take over-the-counter and prescription medicines only as told by your health care provider. Follow directions carefully. Blood  pressure medicines must be taken as prescribed. Do not skip doses of blood pressure medicine. Doing this puts you at risk for problems and can make the medicine less effective. Ask your health care provider about side effects or reactions to medicines that you should watch for. Contact a health care provider if you: Think you are having a reaction to a medicine you are taking. Have headaches that keep coming back (recurring). Feel dizzy. Have swelling in your ankles. Have trouble with your vision. Get help right away if you: Develop a severe headache or confusion. Have unusual weakness or numbness. Feel faint. Have severe pain in your chest or abdomen. Vomit repeatedly. Have trouble breathing. These symptoms may be an emergency. Get help right away. Call 911. Do not wait to see if the symptoms will go away. Do not drive yourself to the hospital. Summary Hypertension is when the force of blood pumping through your arteries is too strong. If this condition is not controlled, it may put you at risk for serious complications. Your personal target blood pressure may vary depending on your medical conditions, your age, and other factors. For most people, a normal blood pressure is less than 120/80. Hypertension is treated with lifestyle changes, medicines, or a combination of both. Lifestyle changes include losing weight, eating a healthy,  low-sodium diet, exercising more, and limiting alcohol. This information is not intended to replace advice given to you by your health care provider. Make sure you discuss any questions you have with your health care provider. Document Revised: 06/24/2021 Document Reviewed: 06/24/2021 Elsevier Patient Education  2024 [employer].

## 2024-07-04 NOTE — Progress Notes (Signed)
 Patient reviewed via MyChart.

## 2024-08-03 ENCOUNTER — Other Ambulatory Visit: Payer: Self-pay | Admitting: Physician Assistant

## 2024-08-08 ENCOUNTER — Ambulatory Visit: Payer: Medicare Other

## 2024-08-08 DIAGNOSIS — I4821 Permanent atrial fibrillation: Secondary | ICD-10-CM | POA: Diagnosis not present

## 2024-08-09 LAB — CUP PACEART REMOTE DEVICE CHECK
Battery Remaining Longevity: 110 mo
Battery Remaining Percentage: 83 %
Battery Voltage: 3.02 V
Brady Statistic RV Percent Paced: 99 %
Date Time Interrogation Session: 20251209022844
Implantable Lead Connection Status: 753985
Implantable Lead Implant Date: 20230609
Implantable Lead Location: 753860
Implantable Pulse Generator Implant Date: 20230609
Lead Channel Impedance Value: 490 Ohm
Lead Channel Pacing Threshold Amplitude: 0.5 V
Lead Channel Pacing Threshold Pulse Width: 0.5 ms
Lead Channel Sensing Intrinsic Amplitude: 12 mV
Lead Channel Setting Pacing Amplitude: 0.75 V
Lead Channel Setting Pacing Pulse Width: 0.5 ms
Lead Channel Setting Sensing Sensitivity: 4 mV
Pulse Gen Model: 1272
Pulse Gen Serial Number: 8068284

## 2024-08-15 ENCOUNTER — Ambulatory Visit (HOSPITAL_BASED_OUTPATIENT_CLINIC_OR_DEPARTMENT_OTHER)
Admission: RE | Admit: 2024-08-15 | Discharge: 2024-08-15 | Disposition: A | Source: Ambulatory Visit | Attending: Family Medicine | Admitting: Family Medicine

## 2024-08-15 DIAGNOSIS — Z780 Asymptomatic menopausal state: Secondary | ICD-10-CM | POA: Insufficient documentation

## 2024-08-15 DIAGNOSIS — E2839 Other primary ovarian failure: Secondary | ICD-10-CM | POA: Insufficient documentation

## 2024-08-15 NOTE — Progress Notes (Signed)
 Remote PPM Transmission

## 2024-08-18 ENCOUNTER — Ambulatory Visit: Payer: Self-pay | Admitting: Cardiology

## 2024-08-25 ENCOUNTER — Other Ambulatory Visit: Payer: Self-pay

## 2024-08-25 ENCOUNTER — Encounter (HOSPITAL_BASED_OUTPATIENT_CLINIC_OR_DEPARTMENT_OTHER): Payer: Self-pay

## 2024-08-25 ENCOUNTER — Emergency Department (HOSPITAL_BASED_OUTPATIENT_CLINIC_OR_DEPARTMENT_OTHER)
Admission: EM | Admit: 2024-08-25 | Discharge: 2024-08-25 | Disposition: A | Discharge status: Home / Self Care | Source: Ambulatory Visit | Attending: Emergency Medicine | Admitting: Emergency Medicine

## 2024-08-25 ENCOUNTER — Other Ambulatory Visit (HOSPITAL_BASED_OUTPATIENT_CLINIC_OR_DEPARTMENT_OTHER): Payer: Self-pay

## 2024-08-25 ENCOUNTER — Emergency Department (HOSPITAL_BASED_OUTPATIENT_CLINIC_OR_DEPARTMENT_OTHER)

## 2024-08-25 ENCOUNTER — Ambulatory Visit: Payer: Self-pay

## 2024-08-25 DIAGNOSIS — H73012 Bullous myringitis, left ear: Secondary | ICD-10-CM | POA: Diagnosis not present

## 2024-08-25 DIAGNOSIS — J209 Acute bronchitis, unspecified: Secondary | ICD-10-CM | POA: Insufficient documentation

## 2024-08-25 DIAGNOSIS — R059 Cough, unspecified: Secondary | ICD-10-CM | POA: Diagnosis present

## 2024-08-25 LAB — RESP PANEL BY RT-PCR (RSV, FLU A&B, COVID)  RVPGX2
Influenza A by PCR: NEGATIVE
Influenza B by PCR: NEGATIVE
Resp Syncytial Virus by PCR: NEGATIVE
SARS Coronavirus 2 by RT PCR: NEGATIVE

## 2024-08-25 LAB — GROUP A STREP BY PCR: Group A Strep by PCR: NOT DETECTED

## 2024-08-25 MED ORDER — METHYLPREDNISOLONE 4 MG PO TBPK
ORAL_TABLET | ORAL | 0 refills | Status: AC
  Filled 2024-08-25: qty 21, 6d supply, fill #0

## 2024-08-25 MED ORDER — BENZONATATE 100 MG PO CAPS
200.0000 mg | ORAL_CAPSULE | Freq: Two times a day (BID) | ORAL | 0 refills | Status: DC | PRN
  Filled 2024-08-25: qty 20, 5d supply, fill #0

## 2024-08-25 MED ORDER — ALBUTEROL SULFATE HFA 108 (90 BASE) MCG/ACT IN AERS
2.0000 | INHALATION_SPRAY | Freq: Once | RESPIRATORY_TRACT | Status: AC
  Administered 2024-08-25: 2 via RESPIRATORY_TRACT
  Filled 2024-08-25: qty 6.7

## 2024-08-25 MED ORDER — IPRATROPIUM-ALBUTEROL 0.5-2.5 (3) MG/3ML IN SOLN
3.0000 mL | Freq: Once | RESPIRATORY_TRACT | Status: AC
  Administered 2024-08-25: 3 mL via RESPIRATORY_TRACT
  Filled 2024-08-25: qty 3

## 2024-08-25 MED ORDER — AEROCHAMBER PLUS FLO-VU MEDIUM MISC
1.0000 | Freq: Once | Status: AC
  Administered 2024-08-25: 1

## 2024-08-25 MED ORDER — CEFUROXIME AXETIL 250 MG PO TABS
250.0000 mg | ORAL_TABLET | Freq: Two times a day (BID) | ORAL | 0 refills | Status: AC
  Filled 2024-08-25: qty 20, 10d supply, fill #0

## 2024-08-25 NOTE — Telephone Encounter (Signed)
 FYI Only or Action Required?: FYI only for provider: recommended to UC or ED.  Patient was last seen in primary care on 07/03/2024 by Domenica Harlene LABOR, MD.  Called Nurse Triage reporting Cough.  Symptoms began 12/202/2025.  Interventions attempted: Rest, hydration, or home remedies.  Symptoms are: unchanged.  Triage Disposition: Go to ED Now (or PCP Triage)  Patient/caregiver understands and will follow disposition?: Yes  Copied from CRM [id number]. Topic: Clinical - Red Word Triage >> Aug 25, 2024  8:46 AM Berneda FALCON wrote: Red Word that prompted transfer to Nurse Triage: Very sore throat since last sat-not getting better  Cough is making her vomit, also has diarrhea and mentioned being very fatigued, no fever Reason for Disposition  Patient sounds very sick or weak to the triager  Answer Assessment - Initial Assessment Questions Productive cough with streaks of blood that started on 12/20. Reports mild shortness of breath. Recommended to UC or ED  1. ONSET: When did the cough begin?      Started 08/19/2024 2. SEVERITY: How bad is the cough today?      severe 3. SPUTUM: Describe the color of your sputum (e.g., none, dry cough; clear, white, yellow, green)     Brownish  4. HEMOPTYSIS: Are you coughing up any blood? If Yes, ask: How much? (e.g., flecks, streaks, tablespoons, etc.)     Streaks of blood at times 5. DIFFICULTY BREATHING: Are you having difficulty breathing? If Yes, ask: How bad is it? (e.g., mild, moderate, severe)      mild 6. FEVER: Do you have a fever? If Yes, ask: What is your temperature, how was it measured, and when did it start?     no 7. CARDIAC HISTORY: Do you have any history of heart disease? (e.g., heart attack, congestive heart failure)      yes 8. LUNG HISTORY: Do you have any history of lung disease?  (e.g., pulmonary embolus, asthma, emphysema)     no 9. PE RISK FACTORS: Do you have a history of blood clots? (or: recent major  surgery, recent prolonged travel, bedridden)     no 10. OTHER SYMPTOMS: Do you have any other symptoms? (e.g., runny nose, wheezing, chest pain)       Diarrhea, fatigue 12. TRAVEL: Have you traveled out of the country in the last month? (e.g., travel history, exposures)       no  Protocols used: Cough - Acute Productive-A-AH

## 2024-08-25 NOTE — Discharge Instructions (Signed)
 ### Your Respiratory Infection and Ear Infection: Treatment Instructions     ## What You Have Been Diagnosed With        You have been diagnosed with two conditions:      1. **Viral respiratory infection with bronchospasm** - A viral infection affecting your breathing tubes that has caused them to narrow, making it harder to breathe      2. **Bullous myringitis** - An ear infection that causes fluid-filled blisters on your eardrum      ## Your Medications        ### For Your Breathing (Bronchospasm)        **Inhaler with Spacer - 2 puffs every 4 hours**      This medication opens up your airways to help you breathe easier.[1][2]      **How to use your inhaler with spacer:**      1. Take off the cap and shake the inhaler for about 5 seconds      2. Attach the inhaler to the spacer and remove the cap from the spacer      3. Sit or stand up straight, take a deep breath and breathe out completely      4. Put the spacer in your mouth and seal your lips tightly around the mouthpiece      5. Press down on the inhaler canister ONCE and breathe in SLOWLY AND DEEPLY for 5 to 10 seconds      6. Remove the spacer from your mouth and hold your breath for 10 seconds (or as long as comfortable)      7. Breathe out slowly      8. Wait at least 60 seconds before taking your second puff      9. Repeat steps 1-7 for the second puff[1]      **Tessalon  (Benzonatate ) - For Cough**      This medication helps reduce your cough.[3]      **IMPORTANT SAFETY WARNINGS:**      - **SWALLOW THE CAPSULES WHOLE** - Do not chew, break, dissolve, cut, or crush them[3]      - If you chew the capsules, they can numb your mouth and throat, which could cause choking[3]      - If you experience numbness or tingling of your tongue, mouth, throat, or face, do not eat or drink until the numbness goes away[3]      - Keep this medication away from children - accidental ingestion can be fatal[3]       **Medrol  (Methylprednisolone ) Dose Pack - Steroid**      This medication reduces inflammation in your airways.[4]      **Important information:**      - Follow the dose pack instructions exactly as printed on the package      - Take with food to reduce stomach upset      - Common side effects may include increased appetite, difficulty sleeping, mood changes, or stomach upset[4]      - Do not stop taking this medication suddenly      - Contact your doctor if you develop severe stomach pain, black or bloody stools, or signs of infection[4]      ### For Your Ear Infection        **Ceftin  (Cefuroxime ) 250 mg - Take twice daily for 10 days**      This antibiotic treats your ear infection.[5]      **Important information:**      - Take all 10 days  of this medication even if you feel better      - You may take this with or without food[5]      - Common side effects may include diarrhea, nausea, or headache[5]      - Stop taking and seek immediate medical attention if you develop severe diarrhea, rash, hives, or difficulty breathing[5]      ## When to Seek Emergency Care        **Call 911 or go to the emergency room immediately if you experience:**      - **Severe difficulty breathing** or unable to speak in full sentences[2]      - Bluish color to your lips or face[2]      - Confusion, extreme drowsiness, or agitation[2]      - Chest pain or rapid heartbeat that doesn't improve with your inhaler[2]      - Oxygen levels below 92% if you have a home monitor[2][6]      - Signs of severe allergic reaction: swelling of face, lips, or throat; severe rash; difficulty breathing[7][5]      **Contact your doctor or seek urgent care if:**      - Your breathing is getting worse despite using your inhaler      - You need to use your inhaler more often than every 3 hours[2]      - Your symptoms are not improving after 48-72 hours      - You develop severe or persistent  diarrhea (may indicate serious bowel infection)[5]      - You develop new fever or worsening symptoms      - You experience severe ear pain that is not improving      - You notice hearing loss or ringing in your ears      ## Follow-Up Care        - **Schedule an appointment with pulmonology or your primary care provider** as recommended by your doctor      - This follow-up is important to ensure your breathing has fully recovered and to determine if you need ongoing treatment      ## General Care Tips        - Get plenty of rest      - Drink adequate fluids to stay hydrated      - Avoid smoking and secondhand smoke      - Use a humidifier if it helps your breathing      - Wash your hands frequently to prevent spreading infection      ## Questions?        If you have any questions about your medications or treatment, contact your healthcare provider's office.      ### References  1. Management of Chronic Obstructive Pulmonary Disease (COPD) (2021). Tod PARAS. Aberle MSN FNP-BC, Rankin CROME. Felton MD FCCS, Prentice Kanner DO, et al. Department of [facility]. 2. 2023 GINA Report, Global Strategy for Asthma Management and Prevention. Sherrilyn LOIS Fransisca Rodgers WENDI Bacharier, Camellia BIRCH. Genevie, et al. Global Initiative for Asthma. 3. Benzonatate . Food and Drug Administration. Updated date: 2020-03-14. 4. Methylprednisolone . Food and Drug Administration. Updated date: 2024-07-31. 5. Cefuroxime  Axetil. Food and Drug Administration. Updated date: 2022-12-28. 6. 2025 Global Strategy for Asthma Management and Prevention. Helen Reddel, Camellia Genevie, Cozette Needle, et al. Global Initiative for Asthma. 7. Cefuroxime . Food and Drug Administration. Updated date: 2020-10-16.

## 2024-08-25 NOTE — ED Provider Notes (Signed)
 " [facility] Provider Note   CSN: [id number] Arrival date & time: 08/25/24  1028     Patient presents with: Cough and Sore Throat   Tracy Miles is a 89 y.o. female with a past medical history of amiodarone  toxicity follows with pulmonology, obesity, obstructive sleep apnea on CPAP, CHF who presents emergency department with URI symptoms x 1 week.  Patient reports that she has had a cough that is worse at night.  She has paroxysms of severe coughing with a tickle in her chest.  She denies fevers nausea vomiting.  She is also noticed progressively worsening fullness and pain in her left ear.  She has associated nasal congestion facial congestion and sinus pressure.  She denies severe headache neck pain neck stiffness rash peripheral edema or abdominal swelling    Cough Sore Throat       Prior to Admission medications  Medication Sig Start Date End Date Taking? Authorizing Provider  benzonatate  (TESSALON ) 100 MG capsule Take 2 capsules (200 mg total) by mouth 2 (two) times daily as needed for cough. 08/25/24  Yes Mardy Hoppe, PA-C  cefUROXime  (CEFTIN ) 250 MG tablet Take 1 tablet (250 mg total) by mouth 2 (two) times daily with a meal for 10 days. 08/25/24 09/04/24 Yes Aysia Lowder, PA-C  methylPREDNISolone  (MEDROL  DOSEPAK) 4 MG TBPK tablet Use as directed 08/25/24  Yes Darcey Demma, PA-C  acetaminophen  (TYLENOL ) 500 MG tablet Take 1,000 mg by mouth every 6 (six) hours as needed for moderate pain (pain score 4-6). Patient taking differently: Take 1,000 mg by mouth every 6 (six) hours as needed for moderate pain (pain score 4-6). Takes 2 every morning, 1 at lunch and 2 at bedtime    [provider]  amLODipine  (NORVASC ) 2.5 MG tablet Take 1 tablet (2.5 mg total) by mouth daily. 07/03/24   Domenica Harlene LABOR, MD  atorvastatin  (LIPITOR) 20 MG tablet Take 1 tablet (20 mg total) by mouth daily. 05/18/24   Domenica Harlene LABOR, MD  bumetanide   (BUMEX ) 1 MG tablet Take 1 tablet (1 mg total) by mouth daily. 07/03/24   Domenica Harlene LABOR, MD  Calcium  Carb-Cholecalciferol  (CALCIUM  600 + D PO) Take 1 tablet by mouth 2 (two) times daily.    [provider]  Carboxymethylcellul-Glycerin (LUBRICATING EYE DROPS OP) Place 1 drop into both eyes in the morning and at bedtime.    [provider]  ELIQUIS  5 MG TABS tablet TAKE ONE TABLET EVERY MORNING AND AT BEDTIME 06/19/24   Domenica Harlene LABOR, MD  lisinopril  (ZESTRIL ) 10 MG tablet Take 1 tablet (10 mg total) by mouth daily. 05/08/24   Domenica Harlene LABOR, MD  loratadine  (CLARITIN ) 10 MG tablet Take 10 mg by mouth daily.     [provider]  Multiple Vitamins-Minerals (ONE-A-DAY WOMENS 50 PLUS PO) Take 1 tablet by mouth daily.    [provider]  nystatin -triamcinolone  ointment (MYCOLOG) Apply to affected area twice daily as directed. 02/04/24   Wheeler Harlene CROME, NP  potassium chloride  (KLOR-CON ) 10 MEQ tablet TAKE 1 TABLET BY MOUTH IN THE  MORNING AND 2 TABLETS BY MOUTH  IN THE EVENING 08/08/24   Leverne Charlies Helling, PA-C  Probiotic CAPS Take 1 capsule by mouth daily. Gummy    [provider]  psyllium (REGULOID) 0.52 g capsule Take 1 capsule by mouth in the morning and at bedtime.    [provider]    Allergies: Gabapentin, Lactose intolerance (gi), Penicillins, Sulfa  antibiotics, Vancomycin , and Zebeta [bisoprolol fumarate]    Review of Systems  Respiratory:  Positive for cough.     Updated Vital Signs BP 139/64 (BP Location: Right Arm)   Pulse 70   Temp 98.1 F (36.7 C)   Resp (!) 22   SpO2 95%   Physical Exam Vitals and nursing note reviewed.  Constitutional:      General: She is not in acute distress.    Appearance: She is well-developed. She is not diaphoretic.  HENT:     Head: Normocephalic and atraumatic.     Right Ear: Tympanic membrane, ear canal and external ear normal.     Left Ear: External ear normal.     Ears:     Comments:  Injected tm with inferior external bulla left ear    Nose: Nose normal.     Mouth/Throat:     Mouth: Mucous membranes are moist.  Eyes:     General: No scleral icterus.    Conjunctiva/sclera: Conjunctivae normal.  Cardiovascular:     Rate and Rhythm: Normal rate and regular rhythm.     Heart sounds: Normal heart sounds. No murmur heard.    No friction rub. No gallop.  Pulmonary:     Effort: Pulmonary effort is normal. No respiratory distress.     Breath sounds: Wheezing present.     Comments: Diffuse exp wheezes Abdominal:     General: Bowel sounds are normal. There is no distension.     Palpations: Abdomen is soft. There is no mass.     Tenderness: There is no abdominal tenderness. There is no guarding.  Musculoskeletal:     Cervical back: Normal range of motion.  Skin:    General: Skin is warm and dry.  Neurological:     Mental Status: She is alert and oriented to person, place, and time.  Psychiatric:        Behavior: Behavior normal.     (all labs ordered are listed, but only abnormal results are displayed) Labs Reviewed  RESP PANEL BY RT-PCR (RSV, FLU A&B, COVID)  [id number]  GROUP A STREP BY PCR    EKG: None  Radiology: DG Chest 2 View Result Date: 08/25/2024 EXAM: 2 VIEW(S) XRAY OF THE CHEST 08/25/2024 11:08:00 AM COMPARISON: 01/20/2024 CLINICAL HISTORY: 89 year old female with sore throat and productive cough for 6 days. FINDINGS: LINES, TUBES AND DEVICES: Stable left subclavian single lead pacemaker. LUNGS AND PLEURA: No focal pulmonary opacity. No pleural effusion. No pneumothorax. HEART AND MEDIASTINUM: Cardiomegaly, unchanged. BONES AND SOFT TISSUES: Thoracic degenerative changes. No acute osseous abnormality. IMPRESSION: 1. No acute cardiopulmonary abnormality Electronically signed by: Helayne Hurst MD 08/25/2024 11:43 AM EST RP Workstation: [id number]     Procedures   Medications Ordered in the ED  ipratropium-albuterol  (DUONEB) 0.5-2.5 (3) MG/3ML nebulizer  solution 3 mL (3 mLs Nebulization Given 08/25/24 1219)  albuterol  (VENTOLIN  HFA) 108 (90 Base) MCG/ACT inhaler 2 puff (2 puffs Inhalation Given 08/25/24 1245)  AeroChamber Plus Flo-Vu Medium MISC 1 each (1 each Other Given 08/25/24 1245)                                    Medical Decision Making Amount and/or Complexity of Data Reviewed Radiology: ordered.  Risk Prescription drug management.   This is an 89 year old female here with URI symptoms x 1 week and ear fullness.  She denies fever she denies exertional dyspnea orthopnea PND or  swelling.  Symptoms do not seem similar to previous episodes of CHF exacerbation. She has no previous history of asthma or bronchospasm is spasm and has never had to use an inhaler in the past.  On physical examination she has diffuse expiratory wheezes.  Her coughing symptoms seem to be consistent with bronchitis with bronchospasm. Given DuoNeb treatment here with significant improvement in her symptoms I reviewed and interpreted patient's lab work She is negative for COVID flu or RSV, negative strep test.   I visualized and interpreted two-view chest x-ray which shows no evidence of pulmonary edema or focal consolidation.  I considered blood work BNP further testing however symptoms are not consistent with CHF exacerbation and the patient improved significantly after DuoNeb.  On further examination patient also has bullous myringitis of the left ear.  Will treat with Ceftin  as the patient has a history of hives with use of penicillins however has been able to tolerate cephalosporins in the past per review of previous medical records.   Patient will be with inhaler, spacer, Tessalon , Medrol , and Ceftin .  She has history of established relationship with pulmonology may follow-up with them or her PCP discussed outpatient follow-up and strict return precautions appropriate for discharge at this time    Final diagnoses:  Acute bronchitis with  bronchospasm  Bullous myringitis of left ear    ED Discharge Orders          Ordered    cefUROXime  (CEFTIN ) 250 MG tablet  2 times daily with meals        08/25/24 1243    methylPREDNISolone  (MEDROL  DOSEPAK) 4 MG TBPK tablet        08/25/24 1243    benzonatate  (TESSALON ) 100 MG capsule  2 times daily PRN        08/25/24 1243               Arloa Chroman, PA-C 08/25/24 1258    Ruthe Cornet, DO 08/25/24 1416  "

## 2024-08-25 NOTE — Telephone Encounter (Signed)
 Pt went to ED

## 2024-08-25 NOTE — ED Triage Notes (Signed)
 Pt ambulatory to triage with walker. Complaining of sore throat and cough since dec 20th. Cough is producing thick yellow mucous CPAP at night is causing cough to increase. Chills, but no fever. Mild SOB with exertion

## 2024-09-11 ENCOUNTER — Ambulatory Visit: Payer: Self-pay

## 2024-09-11 NOTE — Telephone Encounter (Signed)
 FYI Only or Action Required?: FYI only for provider: appointment scheduled on 09/12/24.  Patient was last seen in primary care on 07/03/2024 by Domenica Harlene LABOR, MD.  Called Nurse Triage reporting Cough.  Symptoms began several weeks ago.  Interventions attempted: Prescription medications: Tesslon Pearles, prednisone,cefUROXime  (CEFTIN )  .  Symptoms are: gradually improving.  Triage Disposition: See Physician Within 24 Hours  Patient/caregiver understands and will follow disposition?: Yes  Reason for Disposition  Earache  Answer Assessment - Initial Assessment Questions Seen in ED 12/26, dx with bronchitis and ear infection. Given Albuterol  inhaler, prednisone and cefUROXime  (CEFTIN ) 250 MG , Tessalon  pearles   1. ONSET: When did the cough begin?      3 weeks ago  2. SPUTUM: Describe the color of your sputum (e.g., none, dry cough; clear, white, yellow, green)     Clear  3. HEMOPTYSIS: Are you coughing up any blood? If Yes, ask: How much? (e.g., flecks, streaks, tablespoons, etc.)     Specks of blood occasionally, mostly in the mornings  4. DIFFICULTY BREATHING: Are you having difficulty breathing? If Yes, ask: How bad is it? (e.g., mild, moderate, severe)      Mild SOB with exertion, not much more than baseline  5. FEVER: Do you have a fever? If Yes, ask: What is your temperature, how was it measured, and when did it start?     Denies  6. CARDIAC HISTORY: Do you have any history of heart disease? (e.g., heart attack, congestive heart failure)      CHF  7. LUNG HISTORY: Do you have any history of lung disease?  (e.g., pulmonary embolus, asthma, emphysema)     OSA  8. OTHER SYMPTOMS: Do you have any other symptoms? (e.g., runny nose, wheezing, chest pain)       Left ear, feels like under water. Diminished hearing. Extreme fatigue  Protocols used: Cough - Acute Productive-A-AH  Copied from CRM #[id number]. Topic: Clinical - Red Word Triage >> Sep 11, 2024 10:57 AM Roselie BROCKS wrote: Red Word that prompted transfer to Nurse Triage: Patient states she went to E.R. and was put on medication for bronchitis but she is not getting better, she has a deep productive cough with thick phlegm, and a ear infection and can't hear out of it.

## 2024-09-11 NOTE — Progress Notes (Unsigned)
 No chief complaint on file.   Almarie Cavalier here for URI complaints.  Duration: {Numbers; 1-10:13787} {Time; day/wk/mo/yr:20843}  Associated symptoms: {URI Symptoms :210800001} Denies: {URI Symptoms :210800001} Treatment to date: *** Sick contacts: {yes/no:20286}  Past Medical History:  Diagnosis Date   Allergy    Anemia    Aortic stenosis    Very mild; no AS on 01/12/22 TTE   Benign paroxysmal positional vertigo 12/17/2013   Chicken pox as a child   Colon polyp    Coronary artery disease    DDD (degenerative disc disease) 11/16/2013   Dysrhythmia    Hyperlipidemia    Hypertension    Iron deficiency anemia 11/13/2013   Mitral regurgitation    moderate MR 01/12/22 TTE   Mumps child and teenager   OA (osteoarthritis) 11/19/2013   S/p L TKR   Obesity    OSA on CPAP    Pancreatitis    post hysterectomy   Persistent atrial fibrillation (HCC)    Personal history of colonic polyps 02/25/2014   Personal history of DVT (deep vein thrombosis) X 2   left   Presence of permanent cardiac pacemaker    Sleep apnea    Spinal stenosis     Objective There were no vitals taken for this visit. General: Awake, alert, appears stated age HEENT: AT, Scenic, ears patent b/l and TM's neg, nares patent w/o discharge, pharynx pink and without exudates, MMM Neck: No masses or asymmetry Heart: RRR Lungs: CTAB, no accessory muscle use Psych: Age appropriate judgment and insight, normal mood and affect  No diagnosis found.  Continue to push fluids, practice good hand hygiene, cover mouth when coughing. F/u prn. If starting to experience fevers, shaking, or shortness of breath, seek immediate care. Pt voiced understanding and agreement to the plan.  Harlene LITTIE Jolly, DNP, AGNP-C 09/11/2024 12:25 PM

## 2024-09-11 NOTE — Telephone Encounter (Signed)
 Appt scheduled

## 2024-09-12 ENCOUNTER — Ambulatory Visit: Admitting: Student

## 2024-09-12 ENCOUNTER — Other Ambulatory Visit (HOSPITAL_BASED_OUTPATIENT_CLINIC_OR_DEPARTMENT_OTHER): Payer: Self-pay

## 2024-09-12 ENCOUNTER — Encounter: Payer: Self-pay | Admitting: Student

## 2024-09-12 VITALS — BP 145/67 | HR 73 | Temp 98.1°F | Resp 17 | Ht 63.0 in | Wt 271.8 lb

## 2024-09-12 DIAGNOSIS — J301 Allergic rhinitis due to pollen: Secondary | ICD-10-CM

## 2024-09-12 DIAGNOSIS — R052 Subacute cough: Secondary | ICD-10-CM

## 2024-09-12 MED ORDER — BENZONATATE 100 MG PO CAPS
200.0000 mg | ORAL_CAPSULE | Freq: Two times a day (BID) | ORAL | 0 refills | Status: AC | PRN
  Filled 2024-09-12: qty 20, 5d supply, fill #0

## 2024-09-12 MED ORDER — FLUTICASONE PROPIONATE 50 MCG/ACT NA SUSP
2.0000 | Freq: Every day | NASAL | 6 refills | Status: AC
  Filled 2024-09-12: qty 16, 30d supply, fill #0

## 2024-10-02 ENCOUNTER — Ambulatory Visit: Admitting: Family Medicine

## 2024-10-20 ENCOUNTER — Ambulatory Visit: Admitting: Family Medicine

## 2024-11-07 ENCOUNTER — Ambulatory Visit

## 2024-11-21 ENCOUNTER — Ambulatory Visit (HOSPITAL_BASED_OUTPATIENT_CLINIC_OR_DEPARTMENT_OTHER)

## 2025-01-04 ENCOUNTER — Ambulatory Visit: Admitting: Family Medicine

## 2025-01-23 ENCOUNTER — Ambulatory Visit: Admitting: Adult Health

## 2025-01-25 ENCOUNTER — Ambulatory Visit: Admitting: Family Medicine

## 2025-02-06 ENCOUNTER — Ambulatory Visit

## 2025-05-08 ENCOUNTER — Ambulatory Visit

## 2025-07-03 ENCOUNTER — Ambulatory Visit

## 2025-08-07 ENCOUNTER — Ambulatory Visit

## 2025-11-06 ENCOUNTER — Ambulatory Visit
